# Patient Record
Sex: Female | Born: 1955
Health system: Southern US, Community
[De-identification: ages and names within clinical notes are randomized; demographics above are authoritative.]

## PROBLEM LIST (undated history)

## (undated) DIAGNOSIS — Z8701 Personal history of pneumonia (recurrent): Secondary | ICD-10-CM

## (undated) DIAGNOSIS — H3500 Unspecified background retinopathy: Secondary | ICD-10-CM

## (undated) DIAGNOSIS — R7303 Prediabetes: Secondary | ICD-10-CM

## (undated) DIAGNOSIS — M1990 Unspecified osteoarthritis, unspecified site: Secondary | ICD-10-CM

## (undated) DIAGNOSIS — Z8489 Family history of other specified conditions: Secondary | ICD-10-CM

## (undated) DIAGNOSIS — R112 Nausea with vomiting, unspecified: Secondary | ICD-10-CM

## (undated) DIAGNOSIS — E119 Type 2 diabetes mellitus without complications: Secondary | ICD-10-CM

## (undated) DIAGNOSIS — Z8719 Personal history of other diseases of the digestive system: Secondary | ICD-10-CM

## (undated) DIAGNOSIS — T884XXA Failed or difficult intubation, initial encounter: Secondary | ICD-10-CM

## (undated) DIAGNOSIS — H04129 Dry eye syndrome of unspecified lacrimal gland: Secondary | ICD-10-CM

## (undated) DIAGNOSIS — I10 Essential (primary) hypertension: Secondary | ICD-10-CM

## (undated) DIAGNOSIS — K219 Gastro-esophageal reflux disease without esophagitis: Secondary | ICD-10-CM

## (undated) DIAGNOSIS — E7800 Pure hypercholesterolemia, unspecified: Secondary | ICD-10-CM

## (undated) DIAGNOSIS — R05 Cough: Secondary | ICD-10-CM

## (undated) DIAGNOSIS — Z803 Family history of malignant neoplasm of breast: Secondary | ICD-10-CM

## (undated) DIAGNOSIS — C44529 Squamous cell carcinoma of skin of other part of trunk: Secondary | ICD-10-CM

## (undated) DIAGNOSIS — R519 Headache, unspecified: Secondary | ICD-10-CM

## (undated) DIAGNOSIS — Z801 Family history of malignant neoplasm of trachea, bronchus and lung: Secondary | ICD-10-CM

## (undated) DIAGNOSIS — E039 Hypothyroidism, unspecified: Secondary | ICD-10-CM

## (undated) DIAGNOSIS — R0600 Dyspnea, unspecified: Secondary | ICD-10-CM

## (undated) DIAGNOSIS — H269 Unspecified cataract: Secondary | ICD-10-CM

## (undated) DIAGNOSIS — K589 Irritable bowel syndrome without diarrhea: Secondary | ICD-10-CM

## (undated) DIAGNOSIS — M069 Rheumatoid arthritis, unspecified: Secondary | ICD-10-CM

## (undated) DIAGNOSIS — R059 Cough, unspecified: Secondary | ICD-10-CM

## (undated) DIAGNOSIS — Z8042 Family history of malignant neoplasm of prostate: Secondary | ICD-10-CM

## (undated) DIAGNOSIS — B029 Zoster without complications: Secondary | ICD-10-CM

## (undated) DIAGNOSIS — H409 Unspecified glaucoma: Secondary | ICD-10-CM

## (undated) DIAGNOSIS — Z808 Family history of malignant neoplasm of other organs or systems: Secondary | ICD-10-CM

## (undated) DIAGNOSIS — T8859XA Other complications of anesthesia, initial encounter: Secondary | ICD-10-CM

## (undated) DIAGNOSIS — Z9889 Other specified postprocedural states: Secondary | ICD-10-CM

## (undated) DIAGNOSIS — E079 Disorder of thyroid, unspecified: Secondary | ICD-10-CM

## (undated) DIAGNOSIS — T4145XA Adverse effect of unspecified anesthetic, initial encounter: Secondary | ICD-10-CM

## (undated) HISTORY — PX: ESOPHAGEAL DILATION: SHX303

## (undated) HISTORY — DX: Unspecified cataract: H26.9

## (undated) HISTORY — DX: Essential (primary) hypertension: I10

## (undated) HISTORY — DX: Family history of malignant neoplasm of other organs or systems: Z80.8

## (undated) HISTORY — DX: Dry eye syndrome of unspecified lacrimal gland: H04.129

## (undated) HISTORY — PX: COLONOSCOPY: SHX174

## (undated) HISTORY — PX: TRACHEOSTOMY: SUR1362

## (undated) HISTORY — DX: Unspecified background retinopathy: H35.00

## (undated) HISTORY — PX: CATARACT EXTRACTION, BILATERAL: SHX1313

## (undated) HISTORY — DX: Pure hypercholesterolemia, unspecified: E78.00

## (undated) HISTORY — PX: ABDOMINAL HYSTERECTOMY: SHX81

## (undated) HISTORY — DX: Unspecified glaucoma: H40.9

## (undated) HISTORY — DX: Unspecified osteoarthritis, unspecified site: M19.90

## (undated) HISTORY — PX: TUBAL LIGATION: SHX77

## (undated) HISTORY — DX: Disorder of thyroid, unspecified: E07.9

## (undated) HISTORY — DX: Rheumatoid arthritis, unspecified: M06.9

## (undated) HISTORY — DX: Irritable bowel syndrome, unspecified: K58.9

## (undated) HISTORY — DX: Family history of malignant neoplasm of breast: Z80.3

## (undated) HISTORY — PX: TONSILLECTOMY: SUR1361

## (undated) HISTORY — DX: Family history of malignant neoplasm of trachea, bronchus and lung: Z80.1

## (undated) HISTORY — DX: Zoster without complications: B02.9

## (undated) HISTORY — DX: Family history of malignant neoplasm of prostate: Z80.42

## (undated) HISTORY — PX: CHOLECYSTECTOMY: SHX55

## (undated) HISTORY — PX: SKIN BIOPSY: SHX1

## (undated) HISTORY — PX: BREAST SURGERY: SHX581

---
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
## 2002-10-08 ENCOUNTER — Encounter: Admission: RE | Admit: 2002-10-08 | Discharge: 2002-10-08 | Payer: Self-pay | Admitting: Family Medicine

## 2002-10-08 ENCOUNTER — Encounter: Payer: Self-pay | Admitting: Family Medicine

## 2004-06-03 ENCOUNTER — Ambulatory Visit: Payer: Self-pay | Admitting: Family Medicine

## 2004-07-28 ENCOUNTER — Ambulatory Visit: Payer: Self-pay | Admitting: Family Medicine

## 2005-08-23 ENCOUNTER — Ambulatory Visit: Payer: Self-pay | Admitting: Family Medicine

## 2005-09-10 ENCOUNTER — Ambulatory Visit: Payer: Self-pay | Admitting: Family Medicine

## 2005-09-21 ENCOUNTER — Emergency Department (HOSPITAL_COMMUNITY): Admission: EM | Admit: 2005-09-21 | Discharge: 2005-09-22 | Payer: Self-pay | Admitting: Emergency Medicine

## 2006-01-20 ENCOUNTER — Ambulatory Visit: Payer: Self-pay | Admitting: Family Medicine

## 2006-01-20 LAB — CONVERTED CEMR LAB
BUN: 15 mg/dL (ref 6–23)
Basophils Absolute: 0 10*3/uL (ref 0.0–0.1)
Basophils Relative: 0.6 % (ref 0.0–1.0)
CO2: 30 meq/L (ref 19–32)
Calcium: 10 mg/dL (ref 8.4–10.5)
Chloride: 102 meq/L (ref 96–112)
Creatinine, Ser: 0.6 mg/dL (ref 0.4–1.2)
Eosinophil percent: 2.3 % (ref 0.0–5.0)
GFR calc non Af Amer: 112 mL/min
Glomerular Filtration Rate, Af Am: 135 mL/min/{1.73_m2}
Glucose, Bld: 87 mg/dL (ref 70–99)
HCT: 41 % (ref 36.0–46.0)
Hemoglobin: 14.5 g/dL (ref 12.0–15.0)
Lymphocytes Relative: 29.3 % (ref 12.0–46.0)
MCHC: 35.3 g/dL (ref 30.0–36.0)
MCV: 88.9 fL (ref 78.0–100.0)
Monocytes Absolute: 0.6 10*3/uL (ref 0.2–0.7)
Monocytes Relative: 9.1 % (ref 3.0–11.0)
Neutro Abs: 3.9 10*3/uL (ref 1.4–7.7)
Neutrophils Relative %: 58.7 % (ref 43.0–77.0)
Platelets: 277 10*3/uL (ref 150–400)
Potassium: 3.9 meq/L (ref 3.5–5.1)
RBC: 4.61 M/uL (ref 3.87–5.11)
RDW: 12.2 % (ref 11.5–14.6)
Rheumatoid Fact: 237.3 intl units/mL — ABNORMAL HIGH (ref 0.0–20.0)
Sed Rate: 25 mm/hr (ref 0–25)
Sodium: 141 meq/L (ref 135–145)
TSH: 1.12 microintl units/mL (ref 0.35–5.50)
Total CK: 70 units/L (ref 7–177)
WBC: 6.7 10*3/uL (ref 4.5–10.5)

## 2006-01-25 ENCOUNTER — Ambulatory Visit: Payer: Self-pay | Admitting: Family Medicine

## 2006-01-25 LAB — CONVERTED CEMR LAB: Cyclic Citrullin Peptide Ab: 173 units — ABNORMAL HIGH (ref <20)

## 2006-02-22 ENCOUNTER — Ambulatory Visit: Payer: Self-pay | Admitting: Family Medicine

## 2006-04-05 ENCOUNTER — Ambulatory Visit: Payer: Self-pay | Admitting: Family Medicine

## 2006-04-06 LAB — CONVERTED CEMR LAB
ALT: 15 units/L (ref 0–40)
AST: 21 units/L (ref 0–37)
Albumin: 4.1 g/dL (ref 3.5–5.2)
Alkaline Phosphatase: 66 units/L (ref 39–117)
BUN: 11 mg/dL (ref 6–23)
Basophils Absolute: 0.1 10*3/uL (ref 0.0–0.1)
Basophils Relative: 1 % (ref 0.0–1.0)
Bilirubin, Direct: 0.1 mg/dL (ref 0.0–0.3)
CO2: 32 meq/L (ref 19–32)
Calcium: 9.6 mg/dL (ref 8.4–10.5)
Chloride: 108 meq/L (ref 96–112)
Creatinine, Ser: 0.6 mg/dL (ref 0.4–1.2)
Eosinophils Absolute: 0.4 10*3/uL (ref 0.0–0.6)
Eosinophils Relative: 5.3 % — ABNORMAL HIGH (ref 0.0–5.0)
GFR calc Af Amer: 135 mL/min
GFR calc non Af Amer: 112 mL/min
Glucose, Bld: 95 mg/dL (ref 70–99)
HCT: 41.3 % (ref 36.0–46.0)
Hemoglobin: 14 g/dL (ref 12.0–15.0)
Lymphocytes Relative: 34 % (ref 12.0–46.0)
MCHC: 33.9 g/dL (ref 30.0–36.0)
MCV: 90.7 fL (ref 78.0–100.0)
Monocytes Absolute: 0.5 10*3/uL (ref 0.2–0.7)
Monocytes Relative: 7.4 % (ref 3.0–11.0)
Neutro Abs: 3.8 10*3/uL (ref 1.4–7.7)
Neutrophils Relative %: 52.3 % (ref 43.0–77.0)
Platelets: 277 10*3/uL (ref 150–400)
Potassium: 4.6 meq/L (ref 3.5–5.1)
RBC: 4.55 M/uL (ref 3.87–5.11)
RDW: 12.9 % (ref 11.5–14.6)
Rhuematoid fact SerPl-aCnc: 198 intl units/mL — ABNORMAL HIGH (ref 0.0–20.0)
Sodium: 145 meq/L (ref 135–145)
Total Bilirubin: 0.5 mg/dL (ref 0.3–1.2)
Total Protein: 6.8 g/dL (ref 6.0–8.3)
WBC: 7.3 10*3/uL (ref 4.5–10.5)

## 2006-06-29 DIAGNOSIS — K219 Gastro-esophageal reflux disease without esophagitis: Secondary | ICD-10-CM | POA: Insufficient documentation

## 2006-06-29 DIAGNOSIS — M069 Rheumatoid arthritis, unspecified: Secondary | ICD-10-CM | POA: Insufficient documentation

## 2006-07-04 ENCOUNTER — Ambulatory Visit: Payer: Self-pay | Admitting: Family Medicine

## 2006-11-04 ENCOUNTER — Encounter: Payer: Self-pay | Admitting: Family Medicine

## 2007-01-31 ENCOUNTER — Ambulatory Visit: Payer: Self-pay | Admitting: Family Medicine

## 2007-01-31 LAB — CONVERTED CEMR LAB
ALT: 16 units/L (ref 0–35)
AST: 19 units/L (ref 0–37)
Albumin: 4.1 g/dL (ref 3.5–5.2)
Alkaline Phosphatase: 66 units/L (ref 39–117)
BUN: 18 mg/dL (ref 6–23)
Basophils Absolute: 0 10*3/uL (ref 0.0–0.1)
Basophils Relative: 0.3 % (ref 0.0–1.0)
Bilirubin Urine: NEGATIVE
Bilirubin, Direct: 0.1 mg/dL (ref 0.0–0.3)
CO2: 29 meq/L (ref 19–32)
Calcium: 9.7 mg/dL (ref 8.4–10.5)
Chloride: 104 meq/L (ref 96–112)
Cholesterol: 221 mg/dL (ref 0–200)
Creatinine, Ser: 0.5 mg/dL (ref 0.4–1.2)
Direct LDL: 158.2 mg/dL
Eosinophils Absolute: 0.5 10*3/uL (ref 0.0–0.6)
Eosinophils Relative: 8.3 % — ABNORMAL HIGH (ref 0.0–5.0)
GFR calc Af Amer: 166 mL/min
GFR calc non Af Amer: 137 mL/min
Glucose, Bld: 110 mg/dL — ABNORMAL HIGH (ref 70–99)
Glucose, Urine, Semiquant: NEGATIVE
HCT: 41.8 % (ref 36.0–46.0)
HDL: 37.6 mg/dL — ABNORMAL LOW (ref >39.0)
Hemoglobin: 14.5 g/dL (ref 12.0–15.0)
Ketones, urine, test strip: NEGATIVE
Lymphocytes Relative: 42.4 % (ref 12.0–46.0)
MCHC: 34.7 g/dL (ref 30.0–36.0)
MCV: 91.1 fL (ref 78.0–100.0)
Monocytes Absolute: 0.5 10*3/uL (ref 0.2–0.7)
Monocytes Relative: 8.3 % (ref 3.0–11.0)
Neutro Abs: 2.4 10*3/uL (ref 1.4–7.7)
Neutrophils Relative %: 40.7 % — ABNORMAL LOW (ref 43.0–77.0)
Nitrite: NEGATIVE
Platelets: 305 10*3/uL (ref 150–400)
Potassium: 4.3 meq/L (ref 3.5–5.1)
Protein, U semiquant: NEGATIVE
RBC: 4.59 M/uL (ref 3.87–5.11)
RDW: 12.5 % (ref 11.5–14.6)
Sodium: 141 meq/L (ref 135–145)
Specific Gravity, Urine: 1.02
TSH: 8.94 microintl units/mL — ABNORMAL HIGH (ref 0.35–5.50)
Total Bilirubin: 0.7 mg/dL (ref 0.3–1.2)
Total CHOL/HDL Ratio: 5.9
Total Protein: 7.2 g/dL (ref 6.0–8.3)
Triglycerides: 127 mg/dL (ref 0–149)
Urobilinogen, UA: 0.2
VLDL: 25 mg/dL (ref 0–40)
WBC: 6 10*3/uL (ref 4.5–10.5)
pH: 5.5

## 2007-02-21 ENCOUNTER — Ambulatory Visit: Payer: Self-pay | Admitting: Family Medicine

## 2007-02-21 DIAGNOSIS — G43909 Migraine, unspecified, not intractable, without status migrainosus: Secondary | ICD-10-CM | POA: Insufficient documentation

## 2007-02-21 DIAGNOSIS — L409 Psoriasis, unspecified: Secondary | ICD-10-CM | POA: Insufficient documentation

## 2007-02-21 DIAGNOSIS — M3500 Sicca syndrome, unspecified: Secondary | ICD-10-CM | POA: Insufficient documentation

## 2007-02-21 DIAGNOSIS — N952 Postmenopausal atrophic vaginitis: Secondary | ICD-10-CM | POA: Insufficient documentation

## 2007-02-21 DIAGNOSIS — E039 Hypothyroidism, unspecified: Secondary | ICD-10-CM | POA: Insufficient documentation

## 2007-02-23 ENCOUNTER — Telehealth: Payer: Self-pay | Admitting: Family Medicine

## 2007-04-25 ENCOUNTER — Ambulatory Visit: Payer: Self-pay | Admitting: Family Medicine

## 2007-04-25 LAB — CONVERTED CEMR LAB: TSH: 3.62 microintl units/mL (ref 0.35–5.50)

## 2007-05-03 ENCOUNTER — Telehealth: Payer: Self-pay | Admitting: Family Medicine

## 2007-06-08 ENCOUNTER — Ambulatory Visit: Payer: Self-pay | Admitting: Family Medicine

## 2007-06-08 DIAGNOSIS — N3000 Acute cystitis without hematuria: Secondary | ICD-10-CM | POA: Insufficient documentation

## 2007-06-08 LAB — CONVERTED CEMR LAB
Bilirubin Urine: NEGATIVE
Blood in Urine, dipstick: NEGATIVE
Glucose, Urine, Semiquant: NEGATIVE
Ketones, urine, test strip: NEGATIVE
Nitrite: POSITIVE
Specific Gravity, Urine: 1.015
Urobilinogen, UA: 0.2
pH: 5

## 2007-07-25 ENCOUNTER — Encounter: Payer: Self-pay | Admitting: Internal Medicine

## 2007-10-23 ENCOUNTER — Telehealth: Payer: Self-pay | Admitting: Family Medicine

## 2007-10-25 ENCOUNTER — Telehealth: Payer: Self-pay | Admitting: *Deleted

## 2007-10-30 ENCOUNTER — Telehealth: Payer: Self-pay | Admitting: *Deleted

## 2007-11-16 ENCOUNTER — Ambulatory Visit: Payer: Self-pay | Admitting: Family Medicine

## 2008-01-10 ENCOUNTER — Telehealth: Payer: Self-pay | Admitting: Family Medicine

## 2008-02-07 ENCOUNTER — Ambulatory Visit: Payer: Self-pay | Admitting: Internal Medicine

## 2008-02-20 ENCOUNTER — Ambulatory Visit: Payer: Self-pay | Admitting: Family Medicine

## 2008-02-20 LAB — CONVERTED CEMR LAB
ALT: 19 units/L (ref 0–35)
AST: 24 units/L (ref 0–37)
Albumin: 4.1 g/dL (ref 3.5–5.2)
Alkaline Phosphatase: 60 units/L (ref 39–117)
BUN: 12 mg/dL (ref 6–23)
Basophils Absolute: 0 10*3/uL (ref 0.0–0.1)
Basophils Relative: 1 % (ref 0.0–3.0)
Bilirubin Urine: NEGATIVE
Bilirubin, Direct: 0.1 mg/dL (ref 0.0–0.3)
CO2: 27 meq/L (ref 19–32)
Calcium: 9.6 mg/dL (ref 8.4–10.5)
Chloride: 112 meq/L (ref 96–112)
Cholesterol: 231 mg/dL (ref 0–200)
Creatinine, Ser: 0.7 mg/dL (ref 0.4–1.2)
Direct LDL: 173.7 mg/dL
Eosinophils Absolute: 0.3 10*3/uL (ref 0.0–0.7)
Eosinophils Relative: 6 % — ABNORMAL HIGH (ref 0.0–5.0)
GFR calc Af Amer: 112 mL/min
GFR calc non Af Amer: 93 mL/min
Glucose, Bld: 112 mg/dL — ABNORMAL HIGH (ref 70–99)
Glucose, Urine, Semiquant: NEGATIVE
HCT: 40.6 % (ref 36.0–46.0)
HDL: 41.2 mg/dL (ref >39.0)
Hemoglobin: 14 g/dL (ref 12.0–15.0)
Ketones, urine, test strip: NEGATIVE
Lymphocytes Relative: 44.8 % (ref 12.0–46.0)
MCHC: 34.4 g/dL (ref 30.0–36.0)
MCV: 94 fL (ref 78.0–100.0)
Monocytes Absolute: 0.4 10*3/uL (ref 0.1–1.0)
Monocytes Relative: 8.5 % (ref 3.0–12.0)
Neutro Abs: 1.9 10*3/uL (ref 1.4–7.7)
Neutrophils Relative %: 39.7 % — ABNORMAL LOW (ref 43.0–77.0)
Nitrite: NEGATIVE
Platelets: 276 10*3/uL (ref 150–400)
Potassium: 4 meq/L (ref 3.5–5.1)
Protein, U semiquant: NEGATIVE
RBC: 4.32 M/uL (ref 3.87–5.11)
RDW: 12.8 % (ref 11.5–14.6)
Sodium: 143 meq/L (ref 135–145)
Specific Gravity, Urine: 1.02
TSH: 1.74 microintl units/mL (ref 0.35–5.50)
Total Bilirubin: 0.5 mg/dL (ref 0.3–1.2)
Total CHOL/HDL Ratio: 5.6
Total Protein: 7.1 g/dL (ref 6.0–8.3)
Triglycerides: 112 mg/dL (ref 0–149)
Urobilinogen, UA: 0.2
VLDL: 22 mg/dL (ref 0–40)
WBC Urine, dipstick: NEGATIVE
WBC: 4.8 10*3/uL (ref 4.5–10.5)
pH: 7

## 2008-02-26 ENCOUNTER — Encounter: Payer: Self-pay | Admitting: Family Medicine

## 2008-02-27 ENCOUNTER — Telehealth: Payer: Self-pay | Admitting: Internal Medicine

## 2008-02-27 ENCOUNTER — Ambulatory Visit: Payer: Self-pay | Admitting: Family Medicine

## 2008-02-27 DIAGNOSIS — E785 Hyperlipidemia, unspecified: Secondary | ICD-10-CM | POA: Insufficient documentation

## 2008-03-05 ENCOUNTER — Ambulatory Visit: Payer: Self-pay | Admitting: Internal Medicine

## 2008-05-21 ENCOUNTER — Encounter: Payer: Self-pay | Admitting: Family Medicine

## 2008-07-30 ENCOUNTER — Ambulatory Visit: Payer: Self-pay | Admitting: Family Medicine

## 2008-07-30 LAB — CONVERTED CEMR LAB
BUN: 17 mg/dL (ref 6–23)
CO2: 29 meq/L (ref 19–32)
Calcium: 9.4 mg/dL (ref 8.4–10.5)
Chloride: 108 meq/L (ref 96–112)
Cholesterol: 223 mg/dL — ABNORMAL HIGH (ref 0–200)
Creatinine, Ser: 0.7 mg/dL (ref 0.4–1.2)
Direct LDL: 158.9 mg/dL
GFR calc non Af Amer: 92.37 mL/min (ref >60)
Glucose, Bld: 109 mg/dL — ABNORMAL HIGH (ref 70–99)
HDL: 40 mg/dL (ref >39.00)
Potassium: 4.2 meq/L (ref 3.5–5.1)
Sodium: 143 meq/L (ref 135–145)
Total CHOL/HDL Ratio: 6
Triglycerides: 137 mg/dL (ref 0.0–149.0)
VLDL: 27.4 mg/dL (ref 0.0–40.0)

## 2008-08-06 ENCOUNTER — Ambulatory Visit: Payer: Self-pay | Admitting: Family Medicine

## 2008-08-06 DIAGNOSIS — R030 Elevated blood-pressure reading, without diagnosis of hypertension: Secondary | ICD-10-CM | POA: Insufficient documentation

## 2008-08-26 ENCOUNTER — Telehealth: Payer: Self-pay | Admitting: *Deleted

## 2009-03-18 ENCOUNTER — Ambulatory Visit: Payer: Self-pay | Admitting: Family Medicine

## 2009-03-18 LAB — CONVERTED CEMR LAB
ALT: 20 units/L (ref 0–35)
AST: 30 units/L (ref 0–37)
Albumin: 4.1 g/dL (ref 3.5–5.2)
Alkaline Phosphatase: 68 units/L (ref 39–117)
BUN: 10 mg/dL (ref 6–23)
Basophils Absolute: 0 10*3/uL (ref 0.0–0.1)
Basophils Relative: 0.4 % (ref 0.0–3.0)
Bilirubin Urine: NEGATIVE
Bilirubin, Direct: 0.1 mg/dL (ref 0.0–0.3)
Blood in Urine, dipstick: NEGATIVE
CO2: 30 meq/L (ref 19–32)
Calcium: 9.7 mg/dL (ref 8.4–10.5)
Chloride: 110 meq/L (ref 96–112)
Cholesterol: 234 mg/dL — ABNORMAL HIGH (ref 0–200)
Creatinine, Ser: 0.7 mg/dL (ref 0.4–1.2)
Direct LDL: 163 mg/dL
Eosinophils Absolute: 0.2 10*3/uL (ref 0.0–0.7)
Eosinophils Relative: 3.5 % (ref 0.0–5.0)
GFR calc non Af Amer: 92.16 mL/min (ref >60)
Glucose, Bld: 94 mg/dL (ref 70–99)
Glucose, Urine, Semiquant: NEGATIVE
HCT: 42 % (ref 36.0–46.0)
HDL: 50.8 mg/dL (ref >39.00)
Hemoglobin: 14 g/dL (ref 12.0–15.0)
Ketones, urine, test strip: NEGATIVE
Lymphocytes Relative: 45 % (ref 12.0–46.0)
Lymphs Abs: 2.4 10*3/uL (ref 0.7–4.0)
MCHC: 33.3 g/dL (ref 30.0–36.0)
MCV: 94.8 fL (ref 78.0–100.0)
Monocytes Absolute: 0.4 10*3/uL (ref 0.1–1.0)
Monocytes Relative: 8.3 % (ref 3.0–12.0)
Neutro Abs: 2.2 10*3/uL (ref 1.4–7.7)
Neutrophils Relative %: 42.8 % — ABNORMAL LOW (ref 43.0–77.0)
Nitrite: NEGATIVE
Platelets: 312 10*3/uL (ref 150.0–400.0)
Potassium: 4.4 meq/L (ref 3.5–5.1)
Protein, U semiquant: NEGATIVE
RBC: 4.44 M/uL (ref 3.87–5.11)
RDW: 12.9 % (ref 11.5–14.6)
Sodium: 145 meq/L (ref 135–145)
Specific Gravity, Urine: 1.015
TSH: 2 microintl units/mL (ref 0.35–5.50)
Total Bilirubin: 0.5 mg/dL (ref 0.3–1.2)
Total CHOL/HDL Ratio: 5
Total Protein: 7.9 g/dL (ref 6.0–8.3)
Triglycerides: 133 mg/dL (ref 0.0–149.0)
Urobilinogen, UA: 0.2
VLDL: 26.6 mg/dL (ref 0.0–40.0)
WBC Urine, dipstick: NEGATIVE
WBC: 5.2 10*3/uL (ref 4.5–10.5)
pH: 7.5

## 2009-04-01 ENCOUNTER — Ambulatory Visit: Payer: Self-pay | Admitting: Family Medicine

## 2009-11-05 ENCOUNTER — Encounter: Payer: Self-pay | Admitting: Family Medicine

## 2010-02-19 NOTE — Progress Notes (Signed)
Summary: need correct dosage  Phone Note From Pharmacy   Caller: [facility] Summary of Call: Recieved a fax from the pharmach saying that pt told them that there has been a change in methotrezate 2.5mg . They need a new rx for this. I called the pt and left a message for pt to call us back so we can get the correct dosage for the pharmacy. Initial call taken by: Romualdo Bolk, CMA,  October 30, 2007 3:45 PM  Follow-up for Phone Call        left message with female for patient to return our call Follow-up by: Kern Reap CMA,  November 10, 2007 11:36 AM

## 2010-02-19 NOTE — Assessment & Plan Note (Signed)
Summary: cpx/mhf   Vital Signs:  Patient Profile:   55 Years Old Female Height:     68 inches (172.72 cm) Weight:      214 pounds Temp:     98.1 degrees F oral BP sitting:   140 / 90  (left arm)  Vitals Entered By: Kern Reap CMA (February 27, 2008 8:44 AM)                 Chief Complaint:  cpx.  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  She has rheumatoid arthritis.  She currently takes Celebrex 200 mg daily, Flexeril 10 mg nightly, p.r.n., Darvocet-N 100, 1 or two tabs daily, and methotrexate 2.5-mg tabs.  She takes 8 tablets weekly.  She gets routine follow-up by rheumatology.  She uses Nasonex nasal spray for allergic rhinitis, along with over-the-counter Zyrtec.  She also has migraine headaches.  Since she started the Topamax her migraines have decreased since frequency and severity.  When she does have a breakthrough migraines uses Imitrex 50 mg.  A new problem is severe vaginal dryness.  She tried the cream, but it burns and irritates her.  She would like to discuss other options.  She has hypothyroidism.  She was 75 micrograms of thyroid daily.  TSH is within normal limits.  A new problem is LDL of 173, with a total cholesterol 231.  We will discuss a diet and exercise program with follow-up lab work in 4 months.  Last tetanus booster 2006, last mammogram a year and a half ago.  She states she has some soreness in her left breast and she points to the 12 o'clock position just above the nipple.  She states she bruised herself there.  She is due for a colonoscopy next week.  Another new problem.  She complains of some change in her memory.  She says she is having trouble recalling words on occasion.    Updated Prior Medication List: NASONEX 50 MCG/ACT  SUSP (MOMETASONE FUROATE) use as directed CELEBREX 200 MG  CAPS (CELECOXIB) 1 once daily IMITREX 50 MG  TABS (SUMATRIPTAN  SUCCINATE) UAD FLEXERIL 10 MG  TABS (CYCLOBENZAPRINE HCL) 1 at bedtime as needed DARVOCET-N 100 100-650 MG TABS (PROPOXYPHENE N-APAP) as needed CLOBETASOL PROPIONATE 0.05 %  FOAM (CLOBETASOL PROPIONATE) as needed KETOCONAZOLE 2 %  CREA (KETOCONAZOLE) mixed with flutica   .05% METHOTREXATE 2.5 MG  TABS (METHOTREXATE SODIUM) one tab once daily ESTRACE 0.1 MG/GM  CREA (ESTRADIOL) UAD NEXIUM 40 MG  PACK (ESOMEPRAZOLE MAGNESIUM) Take 1 tablet by mouth every morning ZYRTEC ALLERGY 10 MG  TABS (CETIRIZINE HCL) as needed FISH OIL CONCENTRATE 1000 MG  CAPS (OMEGA-3 FATTY ACIDS) once daily GLUCOSAMINE-CHONDROITIN 1500-1200 MG/30ML  LIQD (GLUCOSAMINE-CHONDROITIN) once daily CALTRATE 600+D 600-400 MG-UNIT  TABS (CALCIUM CARBONATE-VITAMIN D) once daily FOLIC ACID 800 MCG TABS (FOLIC ACID) once daily SYNTHROID 75 MCG TABS (LEVOTHYROXINE SODIUM) Take 1 tablet by mouth every morning MOVIPREP 100 GM  SOLR (PEG-KCL-NACL-NASULF-NA ASC-C) As directed PROMETHAZINE HCL 25 MG TABS (PROMETHAZINE HCL) use as needed for headache TOPAMAX 25 MG TABS (TOPIRAMATE) take one tab at bedtime  Current Allergies (reviewed today): ! TALWIN ! DEMEROL [name]  Past Medical History:    Reviewed history from 02/21/2007 and no changes required:       MHA       PSORIASIS       Rheumatoid arthritis  GERD       OBESE       PMS       hypothyroidism       senile vaginitis       TAH/BSO        1997, prolapse.  No cancer       childbirth x 2       tonsillectomy       Hyperlipidemia  Past Surgical History:    Reviewed history from 06/29/2006 and no changes required:       HYST       BSO       BTL       CBX2       T&A   Family History:    Reviewed history from 02/21/2007 and no changes required:       father died at 50, primary prostate cancer       mother has depression, diabetes, hypertension, history of smoking, hypothyroidism, and breast cancer       no brothers       two sisters, one has  hypothyroidism, and the other in good health  Social History:    Reviewed history from 02/21/2007 and no changes required:       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    Review of Systems      See HPI   Physical Exam  General:     Well-developed,well-nourished,in no acute distress; alert,appropriate and cooperative throughout examination Head:     Normocephalic and atraumatic without obvious abnormalities. No apparent alopecia or balding. Eyes:     No corneal or conjunctival inflammation noted. EOMI. Perrla. Funduscopic exam benign, without hemorrhages, exudates or papilledema. Vision grossly normal. Ears:     External ear exam shows no significant lesions or deformities.  Otoscopic examination reveals clear canals, tympanic membranes are intact bilaterally without bulging, retraction, inflammation or discharge. Hearing is grossly normal bilaterally. Nose:     External nasal examination shows no deformity or inflammation. Nasal mucosa are pink and moist without lesions or exudates. Mouth:     Oral mucosa and oropharynx without lesions or exudates.  Teeth in good repair. Neck:     No deformities, masses, or tenderness noted. Chest Wall:     No deformities, masses, or tenderness noted. Breasts:     No mass, nodules, thickening, tenderness, bulging, retraction, inflamation, nipple discharge or skin changes noted.   Lungs:     Normal respiratory effort, chest expands symmetrically. Lungs are clear to auscultation, no crackles or wheezes. Heart:     Normal rate and regular rhythm. S1 and S2 normal without gallop, murmur, click, rub or other extra sounds. Abdomen:     Bowel sounds positive,abdomen soft and non-tender without masses, organomegaly or hernias noted. Rectal:     No external abnormalities noted. Normal sphincter tone. No rectal  masses or tenderness. Genitalia:     Pelvic Exam:        External: normal female genitalia without lesions or masses        Vagina: normal without lesions or masses        Cervix: normal without lesions or masses        Adnexa: normal  bimanual exam without masses or fullness        Uterus: normal by palpation        Pap smear: not performed Msk:     No deformity or scoliosis noted of thoracic or lumbar spine.   Pulses:     R and L carotid,radial,femoral,dorsalis pedis and posterior tibial pulses are full and equal bilaterally Extremities:     No clubbing, cyanosis, edema, or deformity noted with normal full range of motion of all joints.   Neurologic:     No cranial nerve deficits noted. Station and gait are normal. Plantar reflexes are down-going bilaterally. DTRs are symmetrical throughout. Sensory, motor and coordinative functions appear intact. Skin:     Intact without suspicious lesions or rashes Cervical Nodes:     No lymphadenopathy noted Axillary Nodes:     No palpable lymphadenopathy Inguinal Nodes:     No significant adenopathy Psych:     Cognition and judgment appear intact. Alert and cooperative with normal attention span and concentration. No apparent delusions, illusions, hallucinations    Impression & Recommendations:  Problem # 1:  HYPERLIPIDEMIA (ICD-272.4) Assessment: New  Orders: EKG w/ Interpretation (93000)   Problem # 2:  MIGRAINE HEADACHE (ICD-346.90) Assessment: Improved  Her updated medication list for this problem includes:    Celebrex 200 Mg Caps (Celecoxib) .Marland Kitchen... 1 once daily    Imitrex 50 Mg Tabs (Sumatriptan succinate) ..... Uad    Darvocet-n 100 100-650 Mg Tabs (Propoxyphene n-apap) .Marland Kitchen... Take 1 tablet by mouth three times a day   Problem # 3:  VAGINITIS, ATROPHIC (ICD-627.3) Assessment: Deteriorated  Her updated medication list for this problem includes:    Estrace 0.1 Mg/gm Crea (Estradiol) ..... Uad    Vagifem 25 Mcg Tabs  (Estradiol) ..... Insert one 2 x week   Problem # 4:  UNSPECIFIED HYPOTHYROIDISM (ICD-244.9) Assessment: Improved  Her updated medication list for this problem includes:    Synthroid 75 Mcg Tabs (Levothyroxine sodium) .Marland Kitchen... Take 1 tablet by mouth every morning  Orders: EKG w/ Interpretation (93000)   Problem # 5:  GERD (ICD-530.81) Assessment: Improved  Her updated medication list for this problem includes:    Nexium 40 Mg Pack (Esomeprazole magnesium) .Marland Kitchen... Take 1 tablet by mouth every morning   Problem # 6:  RHEUMATOID ARTHRITIS (ICD-714.0) Assessment: Improved  Her updated medication list for this problem includes:    Celebrex 200 Mg Caps (Celecoxib) .Marland Kitchen... 1 once daily    Methotrexate 2.5 Mg Tabs (Methotrexate sodium) ..... One tab once daily   Complete Medication List: 1)  Nasonex 50 Mcg/act Susp (Mometasone furoate) .... Use as directed 2)  Celebrex 200 Mg Caps (Celecoxib) .Marland Kitchen.. 1 once daily 3)  Imitrex 50 Mg Tabs (Sumatriptan succinate) .... Uad 4)  Flexeril 10 Mg Tabs (Cyclobenzaprine hcl) .Marland Kitchen.. 1 at bedtime as needed 5)  Darvocet-n 100 100-650 Mg Tabs (Propoxyphene n-apap) .... Take 1 tablet by mouth three times a day 6)  Clobetasol Propionate 0.05 % Foam (Clobetasol propionate) .... As needed 7)  Ketoconazole 2 % Crea (Ketoconazole) .... Mixed with flutica   .05% 8)  Methotrexate 2.5 Mg Tabs (Methotrexate sodium) .... One tab once daily 9)  Estrace 0.1 Mg/gm Crea (Estradiol) .... Uad 10)  Nexium 40 Mg Pack (Esomeprazole magnesium) .... Take 1 tablet by mouth every morning 11)  Zyrtec Allergy 10 Mg Tabs (Cetirizine hcl) .... As needed 12)  Fish Oil Concentrate 1000 Mg Caps (Omega-3 fatty acids) .... Once daily 13)  Glucosamine-chondroitin 1500-1200 Mg/49ml  Liqd (Glucosamine-chondroitin) .... Once daily 14)  Caltrate 600+d 600-400 Mg-unit Tabs (Calcium carbonate-vitamin d) .... Once daily 15)  Folic Acid 800 Mcg Tabs (Folic acid) .... Once daily 16)  Synthroid 75 Mcg  Tabs (Levothyroxine sodium) .... Take 1 tablet by mouth every morning 17)  Moviprep 100 Gm Solr (Peg-kcl-nacl-nasulf-na asc-c) .... As directed 18)  Promethazine Hcl 25 Mg Tabs (Promethazine hcl) .... Use as needed for headache 19)  Topamax 25 Mg Tabs (Topiramate) .... Take one tab at bedtime 20)  Vagifem 25 Mcg Tabs (Estradiol) .... Insert one 2 x week  Other Orders: H1N1 vaccine G code (D6387) Influenza A (H1N1) adm  fee Medicare/Non Medicare [id number])   Patient Instructions: 1)  It is important that you exercise regularly at least 20 minutes 5 times a week. If you develop chest pain, have severe difficulty breathing, or feel very tired , stop exercising immediately and seek medical attention. 2)  You need to lose weight. Consider a lower calorie diet and regular exercise.  3)  Please schedule a follow-up appointment in 4 months. 4)  Lipid Panel prior to visit, ICD-9:  272.00 5)  Schedule your mammogram. 6)  Take calcium +Vitamin D daily.   Prescriptions: DARVOCET-N 100 100-650 MG TABS (PROPOXYPHENE N-APAP) Take 1 tablet by mouth three times a day  #100 x 3   Entered and Authorized by:   Roderick Pee MD   Signed by:   Roderick Pee MD on 02/27/2008   Method used:   Print then Give to Patient   RxID:   [id number] VAGIFEM 25 MCG TABS (ESTRADIOL) insert one 2 x week  #30 x 4   Entered and Authorized by:   Roderick Pee MD   Signed by:   Roderick Pee MD on 02/27/2008   Method used:   Electronically to        [facility] (retail)       [address]       [place], [place]  [address]       Ph: [phone]       Fax: [phone]   RxID:   [id number] SYNTHROID 75 MCG TABS (LEVOTHYROXINE SODIUM) Take 1 tablet by mouth every morning  #100 x 3   Entered and Authorized by:   Roderick Pee MD   Signed by:   Roderick Pee MD on 02/27/2008   Method used:   Electronically to        [facility] (retail)       [address]        [place], [place]  [address]       Ph: [phone]       Fax: [phone]   RxID:   [id number] NEXIUM 40 MG  PACK (ESOMEPRAZOLE MAGNESIUM) Take 1 tablet by mouth every morning  #100 x 4   Entered and Authorized by:   Roderick Pee MD   Signed by:   Roderick Pee MD on 02/27/2008   Method used:   Electronically to        [facility] (retail)       [address]       [place], [place]  [address]       Ph: [phone]       Fax: [phone]   RxID:  [id number] KETOCONAZOLE 2 %  CREA (KETOCONAZOLE) mixed with flutica   .05%  #60 grms x 4   Entered and Authorized by:   Roderick Pee MD   Signed by:   Roderick Pee MD on 02/27/2008   Method used:   Electronically to        [facility] (retail)       [address]       [place], [place]  [address]       Ph: [phone]       Fax: [phone]   RxID:   [id number] CLOBETASOL PROPIONATE 0.05 %  FOAM (CLOBETASOL PROPIONATE) as needed  #60 grms x 4   Entered and Authorized by:   Roderick Pee MD   Signed by:   Roderick Pee MD on 02/27/2008   Method used:   Electronically to        [facility] (retail)       [address]       [place], [place]  [address]       Ph: [phone]       Fax: [phone]   RxID:   [id number] FLEXERIL 10 MG  TABS (CYCLOBENZAPRINE HCL) 1 at bedtime as needed  #100 x 4   Entered and Authorized by:   Roderick Pee MD   Signed by:   Roderick Pee MD on 02/27/2008   Method used:   Electronically to        [facility] [address] (retail)       [address]       [place], [place]  [address]       Ph: [phone]       Fax: [phone]   RxID:   [id number] IMITREX 50 MG  TABS (SUMATRIPTAN SUCCINATE) UAD  #9 x 1   Entered and Authorized by:   Roderick Pee MD   Signed by:   Roderick Pee MD on 02/27/2008   Method used:    Electronically to        [facility] [address] (retail)       [address]       [place], [place]  [address]       Ph: [phone]       Fax: [phone]   RxID:   [id number] CELEBREX 200 MG  CAPS (CELECOXIB) 1 once daily  #100 x 4   Entered and Authorized by:   Roderick Pee MD   Signed by:   Roderick Pee MD on 02/27/2008   Method used:   Electronically to        [facility] [address] (retail)       [address]       [place], [place]  [address]       Ph: [phone]       Fax: [phone]   RxID:   [id number] NASONEX 50 MCG/ACT  SUSP (MOMETASONE FUROATE) use as directed  #3 x 4   Entered and Authorized by:   Roderick Pee MD   Signed by:   Roderick Pee MD on 02/27/2008   Method used:   Electronically to        [facility] [address] (retail)       [address]       [place], [place]  [address]       Ph: [phone]       Fax: [phone]   RxID:   [id number]    H1N1 # 1    Vaccine Type: H1N1 vaccine G code    Site: left deltoid    Mfr: novartis    Dose: 0.5 ml    Route: IM    Given by: Kern Reap CMA    Exp. Date: 04/18/2008    Lot #: [id number]

## 2010-02-19 NOTE — Progress Notes (Signed)
Summary: patient does not know the dosage you did not tell her   Phone Note Call from Patient Call back at [phone]   Caller: patient live Call For: Tawanna Cooler Summary of Call: Was in 2-3 for physical.  One of her rx got left off the escript.  Synthroid.  Please send to [facility] [phone] [address]  Initial call taken by: Roselle Locus,  February 23, 2007 12:48 PM  Follow-up for Phone Call        may call in #100 ref x 4 . first check with pt to see what dose she is on.  left mess for to call back. Follow-up by: Sindy Guadeloupe RN,  February 24, 2007 8:26 AM  Additional Follow-up for Phone Call Additional follow up Details #1::        Dr Tawanna Cooler you put her on this at her physical and did not tell her what dosage.  you told her her TSH was too high and that she needed to be on this new med.  you did not give her anrx and youdid not escript this for her.  ..................................................................Marland KitchenRoselle Locus  February 24, 2007 11:28 AM    Additional Follow-up for Phone Call Additional follow up Details #2::    50 micrograms Synthroid number 100 tablets, refills x 4 Follow-up by: Roderick Pee MD,  February 24, 2007 1:29 PM  New/Updated Medications: SYNTHROID 50 MCG  TABS (LEVOTHYROXINE SODIUM) 1 tab  every morning; need F/U TSH in 2 months Please call patient ask her the dose of the Synthroid, Then call in a hundred tablets, one a day, refills x 4  Verbal order , per Dr Tawanna Cooler " synthroid 50 micrograms & f/u TSH in 2 months" .................................................................Marland KitchenMarland KitchenArcola Jansky, RN  February 24, 2007 1:35 PM  Prescriptions: SYNTHROID 50 MCG  TABS (LEVOTHYROXINE SODIUM) 1 tab  every morning; need F/U TSH in 2 months  #100 x 4   Entered by:   Arcola Jansky, RN   Authorized by:   Roderick Pee MD   Signed by:   Arcola Jansky, RN on 02/24/2007   Method used:   Electronically sent to ...       [facility]   [address]  [phone]*       [address]       [place], [place]  [address]       Ph: [phone] or [phone]       Fax: [phone]   RxID:   [id number]

## 2010-02-19 NOTE — Miscellaneous (Signed)
Summary: Immunizations  Immunizations   Imported By: Kassie Mends 08/08/2007 08:44:50  _____________________________________________________________________  External Attachment:    Type:   Image     Comment:   immunizations

## 2010-02-19 NOTE — Progress Notes (Signed)
Summary: celebrex refill  Phone Note From Pharmacy   Caller: [facility]  [address]  [phone]* Summary of Call: pharm requests a refill of celebrex is this okay to fill? Initial call taken by: Kern Reap CMA,  October 23, 2007 8:24 AM  Follow-up for Phone Call        ok for one year Follow-up by: Roderick Pee MD,  October 23, 2007 8:49 AM      Prescriptions: CELEBREX 200 MG  CAPS (CELECOXIB) 1 once daily  #100 x 4   Entered by:   Kern Reap CMA   Authorized by:   Roderick Pee MD   Signed by:   Kern Reap CMA on 10/23/2007   Method used:   Electronically to        [facility]  [phone]* (retail)       [address]       [place], [place]  [address]       Ph: [phone] or [phone]       Fax: [phone]   RxID:   [id number]

## 2010-02-19 NOTE — Assessment & Plan Note (Signed)
Summary: CPX W/PAP/CCM/PT RESCD FROM BUMP/CCM  Medications Added DARVOCET-N 100 100-650 MG TABS (PROPOXYPHENE N-APAP) as needed CLOBETASOL PROPIONATE 0.05 %  FOAM (CLOBETASOL PROPIONATE) as needed KETOCONAZOLE 2 %  CREA (KETOCONAZOLE) mixed with flutica   .05% METHOTREXATE 2.5 MG  TABS (METHOTREXATE SODIUM) q 12 hrs 3 x a wk ESTRACE 0.1 MG/GM  CREA (ESTRADIOL) UAD NEXIUM 40 MG  PACK (ESOMEPRAZOLE MAGNESIUM) Take 1 tablet by mouth every morning      Allergies Added: ! TALWIN ! DEMEROL * DOVANEX  Vital Signs:  Patient Profile:   55 Years Old Female Height:     68 inches (172.72 cm) Weight:      211 pounds (95.91 kg) Temp:     98.2 degrees F (36.78 degrees C) oral Pulse rate:   70 / minute Pulse rhythm:   regular Resp:     12 per minute BP sitting:   162 / 99  (right arm)  Pt. in pain?   no  Vitals Entered By: Arcola Jansky, RN (February 21, 2007 4:08 PM)                  Chief Complaint:  cpx  and labs done.  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  Dry eyes and extremely dry vagina, migraine headaches, psoriasis, sleep dysfunction, secondary to rheumatoid arthritis.  She says her rheumatoid arthritis is under excellent control with the methotrexate 2.5-mg tablets, one every 12 hours x 3.  Weekly.  She's had no major side effects.  The esophagitis is under excellent control with Nexium.  She is having no difficulty swallowing.  The dry skin is a common problem for her in the winter.  She is using a lot of lubrication.  The dry eyes in the dry mouth, and dry.  Some salivary glands are reflective of Sjgren syndrome, which is associated with premature arthritis.  She also has extremely dry, vagina that's more postmenopausal.  She would like to try the hormone cream.  Her migraine headaches, a  decrease in severity and frequency joint has a couple years now.  The psoriasis is under good control with the creams that we have given her.  She would like a refill.  The sleep dysfunction is under good control with Flexeril 10 mg nightly.  Her blood pressure at home is 130/80.  She knows she needs to get her weight down.  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 Tan she wants to get in shape to go bear hunting in [place] in June.  Her mother was recently diagnosed with breast cancer.  Therefore, she did not get to in all winter  Current Allergies: ! TALWIN ! DEMEROL * [name]  Past Medical History:    Reviewed history from 06/29/2006 and no changes required:       MHA       PSORIASIS       Rheumatoid arthritis       GERD       OBESE       PMS       hypothyroidism       senile vaginitis       TAH/BSO        1997, prolapse.  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 No apparent alopecia or balding. Eyes:     No corneal or conjunctival inflammation noted. EOMI. Perrla. Funduscopic exam benign, without hemorrhages, exudates or  papilledema. Vision grossly normal. Ears:     External ear exam shows no significant lesions or deformities.  Otoscopic examination reveals clear canals, tympanic membranes are intact bilaterally without bulging, retraction, inflammation or discharge. Hearing is grossly normal bilaterally. Nose:     External nasal examination shows no deformity or inflammation. Nasal mucosa are pink and moist without lesions or exudates. Mouth:     Oral mucosa and oropharynx without lesions or exudates.  Teeth in good repair. Neck:     No deformities, masses, or tenderness noted. Chest Wall:     No deformities, masses, or tenderness noted. Breasts:     No mass, nodules, thickening, tenderness, bulging, retraction, inflamation, nipple discharge or skin changes noted.   Lungs:     Normal respiratory effort, chest expands symmetrically. Lungs are clear to auscultation, no crackles or wheezes. Heart:     Normal rate and regular rhythm. S1 and S2 normal without gallop, murmur, click, rub or other extra sounds. Abdomen:     Bowel sounds positive,abdomen soft and non-tender without masses, organomegaly or hernias noted. Rectal:     No external abnormalities noted. Normal sphincter tone. No rectal masses or tenderness. Genitalia:     Pelvic Exam:        External: normal female genitalia without lesions or masses        Vagina: normal without lesions or masses        Cervix: normal without lesions or masses        Adnexa: normal bimanual exam without masses or fullness        Uterus: normal by palpation        Pap smear: not performed Msk:     No deformity or scoliosis noted of thoracic or lumbar spine.   Pulses:     R and L carotid,radial,femoral,dorsalis pedis and posterior tibial pulses are full and equal bilaterally Extremities:     No clubbing, cyanosis, edema, or deformity noted with normal full range of motion of all joints.   Neurologic:     No cranial nerve deficits noted. Station and gait are  normal. Plantar reflexes are down-going bilaterally. DTRs are symmetrical throughout. Sensory, motor and coordinative functions appear intact.    Impression & Recommendations:  Problem #  1:  GERD (ICD-530.81) Assessment: Improved  Her updated medication list for this problem includes:    Nexium 40 Mg Pack (Esomeprazole magnesium) .Marland Kitchen... Take 1 tablet by mouth every morning   Problem # 2:  RHEUMATOID ARTHRITIS (ICD-714.0) Assessment: Improved  Her updated medication list for this problem includes:    Celebrex 200 Mg Caps (Celecoxib) .Marland Kitchen... 1 once daily    Methotrexate 2.5 Mg Tabs (Methotrexate sodium) ..... Q 12 hrs 3 x a wk   Problem # 3:  UNSPECIFIED HYPOTHYROIDISM (ICD-244.9) Assessment: New  Problem # 4:  SICCA SYNDROME (ICD-710.2) Assessment: New  Problem # 5:  PSORIASIS (ICD-696.1) Assessment: Improved  Problem # 6:  VAGINITIS, ATROPHIC (ICD-627.3) Assessment: New  Her updated medication list for this problem includes:    Estrace 0.1 Mg/gm Crea (Estradiol) ..... Uad   Problem # 7:  MIGRAINE HEADACHE (ICD-346.90) Assessment: Improved  Her updated medication list for this problem includes:    Celebrex 200 Mg Caps (Celecoxib) .Marland Kitchen... 1 once daily    Imitrex 50 Mg Tabs (Sumatriptan succinate) ..... Uad    Darvocet-n 100 100-650 Mg Tabs (Propoxyphene n-apap) .Marland Kitchen... As needed   Complete Medication List: 1)  Nasonex 50 Mcg/act Susp (Mometasone furoate) .... Use as directed 2)  Celebrex 200 Mg Caps (Celecoxib) .Marland Kitchen.. 1 once daily 3)  Imitrex 50 Mg Tabs (Sumatriptan succinate) .... Uad 4)  Flexeril 10 Mg Tabs (Cyclobenzaprine hcl) .Marland Kitchen.. 1 at bedtime as needed 5)  Darvocet-n 100 100-650 Mg Tabs (Propoxyphene n-apap) .... As needed 6)  Clobetasol Propionate 0.05 % Foam (Clobetasol propionate) .... As needed 7)  Ketoconazole 2 % Crea (Ketoconazole) .... Mixed with flutica   .05% 8)  Methotrexate 2.5 Mg Tabs (Methotrexate sodium) .... Q 12 hrs 3 x a wk 9)  Estrace 0.1 Mg/gm  Crea (Estradiol) .... Uad 10)  Nexium 40 Mg Pack (Esomeprazole magnesium) .... Take 1 tablet by mouth every morning  Other Orders: EKG w/ Interpretation (93000)   Patient Instructions: 1)   continue your current medications.  Add the Estrace vaginal cream. 2)  Please schedule a follow-up appointment in 1 year. 3)  It is important that you exercise regularly at least 20 minutes 5 times a week. If you develop chest pain, have severe difficulty breathing, or feel very tired , stop exercising immediately and seek medical attention. 4)  You need to lose weight. Consider a lower calorie diet and regular exercise.  5)  Schedule your mammogram. 6)  Take calcium +Vitamin D daily. 7)  Take an Aspirin every day.    Prescriptions: NEXIUM 40 MG  PACK (ESOMEPRAZOLE MAGNESIUM) Take 1 tablet by mouth every morning  #100 x 4   Entered and Authorized by:   Roderick Pee MD   Signed by:   Roderick Pee MD on 02/21/2007   Method used:   Electronically sent to ...       [facility]  [address]  [phone]*       [address]       [place], [place]  [address]       Ph: [phone] or [phone]       Fax: [phone]   RxID:   [id number] ESTRACE 0.1 MG/GM  CREA (ESTRADIOL) UAD  #3 tubes x 10   Entered and Authorized by:   Roderick Pee MD   Signed by:   Roderick Pee MD on 02/21/2007   Method used:   Electronically sent to .Marland KitchenMarland Kitchen  [facility]  [address]  [phone]*       [address]       [place], [place]  [address]       Ph: [phone] or [phone]       Fax: [phone]   RxID:   [id number] METHOTREXATE 2.5 MG  TABS (METHOTREXATE SODIUM) q 12 hrs 3 x a wk  #36 x 4   Entered and Authorized by:   Roderick Pee MD   Signed by:   Roderick Pee MD on 02/21/2007   Method used:   Electronically sent to ...       [facility]  [address]  [phone]*       [address]       [place], [place]  [address]        Ph: [phone] or [phone]       Fax: [phone]   RxID:   [id number] KETOCONAZOLE 2 %  CREA (KETOCONAZOLE) mixed with flutica   .05%  #60 grms x 4   Entered and Authorized by:   Roderick Pee MD   Signed by:   Roderick Pee MD on 02/21/2007   Method used:   Electronically sent to ...       [facility]  [address]  [phone]*       [address]       [place], [place]  [address]       Ph: [phone] or [phone]       Fax: [phone]   RxID:   [id number] CLOBETASOL PROPIONATE 0.05 %  FOAM (CLOBETASOL PROPIONATE) as needed  #60 grms x 4   Entered and Authorized by:   Roderick Pee MD   Signed by:   Roderick Pee MD on 02/21/2007   Method used:   Electronically sent to ...       [facility]  [address]  [phone]*       [address]       [place], [place]  [address]       Ph: [phone] or [phone]       Fax: [phone]   RxID:   [id number] FLEXERIL 10 MG  TABS (CYCLOBENZAPRINE HCL) 1 at bedtime as needed  #100 x 4   Entered and Authorized by:   Roderick Pee MD   Signed by:   Roderick Pee MD on 02/21/2007   Method used:   Electronically sent to ...       [facility]  [address]  [phone]*       [address]       [place], [place]  [address]       Ph: [phone] or [phone]       Fax: [phone]   RxID:   [id number] IMITREX 50 MG  TABS (SUMATRIPTAN SUCCINATE) UAD  #6 x 11   Entered and Authorized by:   Roderick Pee MD   Signed by:   Roderick Pee MD on 02/21/2007   Method used:   Electronically sent to ...       [facility]  [address]  [phone]*       [address]       [place], [place]  [address]  Ph: [phone] or [phone]       Fax: [phone]   RxID:   [id number] CELEBREX 200 MG  CAPS (CELECOXIB) 1 once daily  #100 x 4   Entered and Authorized by:   Roderick Pee MD   Signed by:   Roderick Pee MD on  02/21/2007   Method used:   Electronically sent to ...       [facility]  [address]  [phone]*       [address]       [place], [place]  [address]       Ph: [phone] or [phone]       Fax: [phone]   RxID:   [id number] NASONEX 50 MCG/ACT  SUSP (MOMETASONE FUROATE) use as directed  #3 x 4   Entered and Authorized by:   Roderick Pee MD   Signed by:   Roderick Pee MD on 02/21/2007   Method used:   Electronically sent to ...       [facility]  [address]  [phone]*       [address]       [place], [place]  [address]       Ph: [phone] or [phone]       Fax: [phone]   RxID:   [id number]  ]

## 2010-02-19 NOTE — Assessment & Plan Note (Signed)
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  We saw her in February.  Her LDL was in the 170 range.  She changed her diet and struck down to 140 range.  Also, when she had her last rheumatology consult.  Her BP was elevated.  Also, her heart rate was a hundred.  Her rheumatologist suggested she stop the Celebrex, which she did not BP 120/78, heart rate 70 and regular.    Allergies: 1)  ! Talwin 2)  ! Demerol 3)  * Dovanex  Past History:  Past medical, surgical, family and social histories (including risk factors) reviewed for relevance to current acute and chronic problems.  Past Medical History: Reviewed history from 02/27/2008 and no changes required. MHA PSORIASIS Rheumatoid arthritis GERD OBESE PMS hypothyroidism senile vaginitis TAH/BSO  1997, prolapse.  No cancer childbirth x 2 tonsillectomy Hyperlipidemia  Past Surgical History: Reviewed history from 06/29/2006 and no changes required. HYST BSO BTL CBX2 T&A  Family History: Reviewed history from 02/21/2007 and no changes required. father died at 29, primary prostate cancer mother has depression, diabetes, hypertension, history of smoking, hypothyroidism, and breast cancer no brothers two sisters, one has hypothyroidism, and the other in good health  Social History: Reviewed history from 02/27/2008 and no changes required. 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  Review of Systems      See HPI  Physical Exam  General:  Well-developed,well-nourished,in no acute distress; alert,appropriate and cooperative throughout examination Heart:  120/78, with a pulse of 70   Impression & Recommendations:  Problem # 1:  HYPERLIPIDEMIA (ICD-272.4) Assessment Improved  Problem # 2:  ELEVATED BLOOD PRESSURE WITHOUT DIAGNOSIS OF HYPERTENSION (ICD-796.2) Assessment: New  Complete Medication List: 1)  Nasonex 50 Mcg/act Susp (Mometasone furoate) .... Use as directed 2)  Celebrex 200 Mg Caps (Celecoxib) .Marland Kitchen.. 1 once daily as needed 3)  Imitrex 50 Mg Tabs (Sumatriptan succinate) .... Uad 4)  Flexeril 10 Mg Tabs (Cyclobenzaprine hcl) .Marland Kitchen.. 1 at bedtime as needed 5)  Darvocet-n 100 100-650 Mg Tabs (Propoxyphene n-apap) .... Take 1 tablet by mouth three times a day 6)  Clobetasol Propionate 0.05 % Foam (Clobetasol propionate) .... As needed 7)  Ketoconazole 2 % Crea (Ketoconazole) .... Mixed with flutica   .05% 8)  Methotrexate 2.5 Mg Tabs (Methotrexate sodium) .... 8 tabs once daily 9)  Estrace 0.1 Mg/gm Crea (Estradiol) .... Uad 10)  Nexium 40 Mg Pack (Esomeprazole magnesium) .... Take 1 tablet by mouth every morning 11)  Zyrtec Allergy 10 Mg Tabs (Cetirizine hcl) .... As needed 12)  Fish Oil Concentrate 1000 Mg Caps (Omega-3 fatty acids) .... Once daily 13)  Glucosamine-chondroitin 1500-1200 Mg/47ml Liqd (Glucosamine-chondroitin) .... Once daily 14)  Caltrate 600+d 600-400 Mg-unit Tabs (Calcium carbonate-vitamin d) .... Once daily 15)  Folic Acid 800 Mcg Tabs (Folic acid) .... Once daily 16)  Synthroid 75  Mcg Tabs (Levothyroxine sodium) .... Take 1 tablet by mouth every morning 17)  Moviprep 100 Gm Solr (Peg-kcl-nacl-nasulf-na asc-c) .... As directed 18)  Promethazine Hcl 25 Mg Tabs (Promethazine hcl) .... Use as needed for headache 19)  Topamax 25 Mg Tabs (Topiramate) .... Take one tab at bedtime 20)  Vagifem 25 Mcg Tabs (Estradiol) .... Insert one 2 x week  Patient  Instructions: 1)  purchased a digital blood pressure cuff to monitor your blood pressure weekly at home. 2)  Continue the dietary program.  Its decreased.  Her lipids.  It would also be helpful.  Three.  Her overall health, lipids, and your joints to walk 20 minutes daily over above, your normal workday. 3)  Return in February, p.r.n.

## 2010-02-19 NOTE — Assessment & Plan Note (Signed)
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  She has a history of allergic rhinitis, for which she uses Nasonex nasal spray and OTC as her tachycardia.  She has a history of migraine headaches.  She takes Imitrex, and Topamax.  Her current Topamax dose is 50 mg.  She had 3 migraines in the past year.  We discussed options.  We will increase the Topamax.  She has underlying room at her arthritis.  She takes Flexeril, 10 mg nightly tramadol 50 mg dose two tabs q.a.m. and methotrexate via her rheumatologist.  She states she is functioning okay.  She takes Nexium 40 mg q.a.m. because of reflux esophagitis.  She's had a history of an esophageal stricture that had to be dilated.  She uses various creams for skin.  She also takes calcium and vitamin D folic acid.  She is not take aspirin daily because of her esophageal problems in the past.  She gets routine eye care.  Dental caries does BSE monthly and gets annual mammography.  Colonoscopy normal in GI.  Tetanus 2006.  Allergies: 1)  ! Talwin 2)  ! Demerol 3)  * Dovanex  Past History:  Past medical, surgical, family and social histories (including risk factors) reviewed, and no changes noted (except as noted below).  Past Medical History: Reviewed history from 02/27/2008 and no changes required. MHA PSORIASIS Rheumatoid arthritis GERD OBESE PMS hypothyroidism senile vaginitis TAH/BSO  1997, prolapse.  No cancer childbirth x 2 tonsillectomy Hyperlipidemia  Past Surgical  History: Reviewed history from 06/29/2006 and no changes required. HYST BSO BTL CBX2 T&A  Family History: Reviewed history from 02/21/2007 and no changes required. father died at 45, primary prostate cancer mother has depression, diabetes, hypertension, history of smoking, hypothyroidism, and breast cancer no brothers two sisters, one has hypothyroidism, and the other in good health  Social History: Reviewed history from 02/27/2008 and no changes required. 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  Review of Systems      See HPI  Physical Exam  General:  Well-developed,well-nourished,in no acute distress; alert,appropriate and cooperative throughout examination Head:  Normocephalic and atraumatic without obvious abnormalities. No apparent alopecia or balding. Eyes:  No corneal or conjunctival inflammation noted. EOMI. Perrla. Funduscopic exam benign, without hemorrhages, exudates or papilledema. Vision grossly normal. Ears:  External ear exam shows no significant lesions or deformities.  Otoscopic examination reveals clear canals, tympanic membranes are intact bilaterally without bulging, retraction, inflammation or discharge. Hearing is grossly normal bilaterally. Nose:  External nasal examination shows no deformity or inflammation. Nasal mucosa are pink and moist without lesions or exudates. Mouth:  Oral mucosa and oropharynx without lesions or exudates.  Teeth in good repair. Neck:  No deformities, masses, or tenderness noted. Chest Wall:  No deformities, masses, or tenderness noted. Breasts:  No mass, nodules, thickening, tenderness, bulging, retraction, inflamation, nipple  discharge or skin changes noted.   Lungs:  Normal respiratory effort, chest expands symmetrically. Lungs are clear to auscultation, no crackles or wheezes. Heart:  Normal rate and regular rhythm. S1 and S2  normal without gallop, murmur, click, rub or other extra sounds. Abdomen:  Bowel sounds positive,abdomen soft and non-tender without masses, organomegaly or hernias noted. Rectal:  No external abnormalities noted. Normal sphincter tone. No rectal masses or tenderness. Genitalia:  Pelvic Exam:        External: normal female genitalia without lesions or masses        Vagina: normal without lesions or masses        Cervix: normal without lesions or masses        Adnexa: normal bimanual exam without masses or fullness        Uterus: normal by palpation        Pap smear: not performed Msk:  No deformity or scoliosis noted of thoracic or lumbar spine.   Pulses:  R and L carotid,radial,femoral,dorsalis pedis and posterior tibial pulses are full and equal bilaterally Extremities:  No clubbing, cyanosis, edema, or deformity noted with normal full range of motion of all joints.   Neurologic:  No cranial nerve deficits noted. Station and gait are normal. Plantar reflexes are down-going bilaterally. DTRs are symmetrical throughout. Sensory, motor and coordinative functions appear intact. Skin:  Intact without suspicious lesions or rashes Cervical Nodes:  No lymphadenopathy noted Axillary Nodes:  No palpable lymphadenopathy Inguinal Nodes:  No significant adenopathy Psych:  Cognition and judgment appear intact. Alert and cooperative with normal attention span and concentration. No apparent delusions, illusions, hallucinations   Impression & Recommendations:  Problem # 1:  MIGRAINE HEADACHE (ICD-346.90) Assessment Improved  The following medications were removed from the medication list:    Darvocet-n 100 100-650 Mg Tabs (Propoxyphene n-apap) .Marland Kitchen... Take 1 tablet by mouth two times a day Her updated medication list for this problem includes:    Celebrex 200 Mg Caps (Celecoxib) .Marland Kitchen... 1 once daily as needed    Imitrex 50 Mg Tabs (Sumatriptan succinate) ..... Uad    Tramadol Hcl 50 Mg Tabs (Tramadol  hcl) .Marland Kitchen... Take 2 tabs every morning  Orders: Prescription Created Electronically [phone])  Problem # 2:  VAGINITIS, ATROPHIC (ICD-627.3) Assessment: Deteriorated  The following medications were removed from the medication list:    Vagifem 25 Mcg Tabs (Estradiol) ..... Insert one 2 x week Her updated medication list for this problem includes:    Estrace 0.1 Mg/gm Crea (Estradiol) ..... Uad  Orders: Prescription Created Electronically [phone])  Problem # 3:  PSORIASIS (ICD-696.1) Assessment: Unchanged  Orders: Prescription Created Electronically [phone])  Problem # 4:  UNSPECIFIED HYPOTHYROIDISM (ICD-244.9) Assessment: Improved  Her updated medication list for this problem includes:    Synthroid 75 Mcg Tabs (Levothyroxine sodium) .Marland Kitchen... Take 1 tablet by mouth every morning  Orders: Prescription Created Electronically [phone])  Problem # 5:  PHYSICAL EXAMINATION (ICD-V70.0) Assessment: Unchanged  Orders: Prescription Created Electronically [phone])  Problem # 6:  GERD (ICD-530.81) Assessment: Improved  Her updated medication list for this problem includes:    Nexium 40 Mg Pack (Esomeprazole magnesium) .Marland Kitchen... Take 1 tablet by mouth every morning  Orders: Prescription Created Electronically [phone])  Complete Medication List: 1)  Nasonex 50 Mcg/act Susp (Mometasone furoate) .... Use as directed 2)  Celebrex 200 Mg Caps (Celecoxib) .Marland Kitchen.. 1 once daily as needed 3)  Imitrex 50 Mg Tabs (Sumatriptan succinate) .... Uad 4)  Flexeril 10 Mg Tabs (Cyclobenzaprine hcl) .Marland KitchenMarland KitchenMarland Kitchen  1 at bedtime as needed 5)  Clobetasol Propionate 0.05 % Foam (Clobetasol propionate) .... As needed 6)  Ketoconazole 2 % Crea (Ketoconazole) .... Mixed with flutica   .05% 7)  Methotrexate 2.5 Mg Tabs (Methotrexate sodium) .... 8 tabs once daily 8)  Estrace 0.1 Mg/gm Crea (Estradiol) .... Uad 9)  Nexium 40 Mg Pack (Esomeprazole magnesium) .... Take 1 tablet by mouth every morning 10)  Zyrtec Allergy 10 Mg Tabs  (Cetirizine hcl) .... As needed 11)  Fish Oil Concentrate 1000 Mg Caps (Omega-3 fatty acids) .... Once daily 12)  Glucosamine-chondroitin 1500-1200 Mg/18ml Liqd (Glucosamine-chondroitin) .... Once daily 13)  Caltrate 600+d 600-400 Mg-unit Tabs (Calcium carbonate-vitamin d) .... Once daily 14)  Folic Acid 800 Mcg Tabs (Folic acid) .... Once daily 15)  Synthroid 75 Mcg Tabs (Levothyroxine sodium) .... Take 1 tablet by mouth every morning 16)  Promethazine Hcl 25 Mg Tabs (Promethazine hcl) .... Use as needed for headache 17)  Tramadol Hcl 50 Mg Tabs (Tramadol hcl) .... Take 2 tabs every morning 18)  Topamax 100 Mg Tabs (Topiramate) .Marland Kitchen.. 1 tab @ bedtime  Other Orders: EKG w/ Interpretation ([id number])  Patient Instructions: 1)  increase the Topamax to 100 mg at bedtime to see if we can get her migraine headaches to stop. 2)  Use small amounts of the Premarin vaginal cream twice weekly. 3)  Please schedule a follow-up appointment in 1 year. 4)  It is important that you exercise regularly at least 20 minutes 5 times a week. If you develop chest pain, have severe difficulty breathing, or feel very tired , stop exercising immediately and seek medical attention. 5)  Schedule a colonoscopy/sigmoidoscopy to help detect colon cancer. 6)  Take calcium +Vitamin D daily. Prescriptions: FLEXERIL 10 MG  TABS (CYCLOBENZAPRINE HCL) 1 at bedtime as needed  #100 x 4   Entered and Authorized by:   Roderick Pee MD   Signed by:   Roderick Pee MD on 04/01/2009   Method used:   Print then Give to Patient   RxID:   [id number] PROMETHAZINE HCL 25 MG TABS (PROMETHAZINE HCL) use as needed for headache  #30 x 1   Entered and Authorized by:   Roderick Pee MD   Signed by:   Roderick Pee MD on 04/01/2009   Method used:   Electronically to        [facility] (retail)       [address]       [place], [place]  [address]       Ph: [phone]       Fax: [phone]   RxID:    [id number] SYNTHROID 75 MCG TABS (LEVOTHYROXINE SODIUM) Take 1 tablet by mouth every morning  #100 x 3   Entered and Authorized by:   Roderick Pee MD   Signed by:   Roderick Pee MD on 04/01/2009   Method used:   Electronically to        [facility] (retail)       [address]       [place], [place]  [address]       Ph: [phone]       Fax: [phone]   RxID:   [id number] NEXIUM 40 MG  PACK (ESOMEPRAZOLE MAGNESIUM) Take 1 tablet by mouth every morning  #100 x 3   Entered and Authorized  by:   Roderick Pee MD   Signed by:   Roderick Pee MD on 04/01/2009   Method used:   Electronically to        [facility] (retail)       [address]       [place], [place]  [address]       Ph: [phone]       Fax: [phone]   RxID:   [id number] ESTRACE 0.1 MG/GM  CREA (ESTRADIOL) UAD  #3 tubes x 10   Entered and Authorized by:   Roderick Pee MD   Signed by:   Roderick Pee MD on 04/01/2009   Method used:   Electronically to        [facility] (retail)       [address]       [place], [place]  [address]       Ph: [phone]       Fax: [phone]   RxID:   [id number] KETOCONAZOLE 2 %  CREA (KETOCONAZOLE) mixed with flutica   .05%  #60 grms x 4   Entered and Authorized by:   Roderick Pee MD   Signed by:   Roderick Pee MD on 04/01/2009   Method used:   Electronically to        [facility] [address] (retail)       [address]       [place], [place]  [address]       Ph: [phone]       Fax: [phone]   RxID:   [id number] CLOBETASOL PROPIONATE 0.05 %  FOAM (CLOBETASOL PROPIONATE) as needed  #60 grms x 4   Entered and Authorized by:   Roderick Pee MD   Signed by:   Roderick Pee MD on 04/01/2009   Method used:   Electronically to        [facility] (retail)       [address]       [place], [place]  [address]       Ph: [phone]       Fax: [phone]   RxID:   [id number] IMITREX 50 MG  TABS (SUMATRIPTAN SUCCINATE) UAD  #9 Tablet x 0   Entered and Authorized by:   Roderick Pee MD   Signed by:   Roderick Pee MD on 04/01/2009   Method used:   Electronically to        [facility] [address] (retail)       [address]       [place], [place]  [address]       Ph: [phone]       Fax: [phone]   RxID:   [id number] NASONEX 50 MCG/ACT  SUSP (MOMETASONE FUROATE) use as directed  #3 x 4   Entered and Authorized by:   Roderick Pee MD   Signed by:   Roderick Pee MD on 04/01/2009   Method used:   Electronically to        [facility] (retail)       [address]       [place], [place]  [address]       Ph: [phone]       Fax: [phone]   RxID:   [id number] TOPAMAX 100 MG TABS (TOPIRAMATE) 1 tab @ bedtime  #100 x 3   Entered and Authorized by:   Roderick Pee MD   Signed by:   Roderick Pee MD on 04/01/2009   Method used:   Electronically to        [facility] (retail)       [address]       [place], [place]  [address]       Ph: [phone]       Fax: [phone]   RxID:   [id number]

## 2010-02-19 NOTE — Assessment & Plan Note (Signed)
Summary: bladder infection/jls   Vital Signs:  Patient Profile:   55 Years Old Female Height:     68 inches (172.72 cm) Weight:      214 pounds Temp:     98.4 degrees F oral BP sitting:   134 / 96  (left arm)                 Chief Complaint:  uti.  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  She said no, fever, chills, or back pain.  She said no nausea, vomiting, or diarrhea.  Her last urine tract infection was 26 years ago.    Current Allergies: ! TALWIN ! 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    Impression & Recommendations:  Problem # 1:  ACUTE CYSTITIS (ICD-595.0) Assessment: New  Her updated medication list for this problem includes:    Septra Ds 800-160 Mg Tabs (Sulfamethoxazole-trimethoprim) .Marland Kitchen... Take 1 tablet by mouth two times a day  Orders: UA Dipstick w/o Micro (manual) ([address])   Complete Medication List: 1)  Nasonex 50 Mcg/act Susp (Mometasone furoate) .... Use as directed 2)  Celebrex 200 Mg Caps (Celecoxib) .Marland Kitchen.. 1 once daily 3)  Imitrex 50 Mg Tabs (Sumatriptan succinate) .... Uad 4)  Flexeril 10 Mg Tabs (Cyclobenzaprine hcl) .Marland Kitchen.. 1 at bedtime as needed 5)  Darvocet-n 100 100-650 Mg Tabs (Propoxyphene n-apap) .... As needed 6)  Clobetasol Propionate 0.05 % Foam (Clobetasol propionate) .... As needed 7)  Ketoconazole 2 % Crea  (Ketoconazole) .... Mixed with flutica   .05% 8)  Methotrexate 2.5 Mg Tabs (Methotrexate sodium) .... Q 12 hrs 3 x a wk 9)  Estrace 0.1 Mg/gm Crea (Estradiol) .... Uad 10)  Nexium 40 Mg Pack (Esomeprazole magnesium) .... Take 1 tablet by mouth every morning 11)  Synthroid 50 Mcg Tabs (Levothyroxine sodium) .Marland Kitchen.. 1 tab  every morning; need f/u tsh in 2 months 12)  Zyrtec Allergy 10 Mg Tabs (Cetirizine hcl) .... As needed 13)  Fish Oil Concentrate 1000 Mg Caps (Omega-3 fatty acids) .... Once daily 14)  Glucosamine-chondroitin 1500-1200 Mg/76ml Liqd (Glucosamine-chondroitin) .... Once daily 15)  Caltrate 600+d 600-400 Mg-unit Tabs (Calcium carbonate-vitamin d) .... Once daily 16)  Septra Ds 800-160 Mg Tabs (Sulfamethoxazole-trimethoprim) .... Take 1 tablet by mouth two times a day   Patient Instructions: 1)  begin Septra DS one twice a day for 10 days.  I will give you some extra travel tablets just in case you have a flare, why you're gone   Prescriptions: SEPTRA DS 800-160 MG  TABS (SULFAMETHOXAZOLE-TRIMETHOPRIM) Take 1 tablet by mouth two times a day  #40 x 1   Entered and Authorized by:   Roderick Pee MD   Signed by:  Roderick Pee MD on 06/08/2007   Method used:   Electronically sent to ...       [facility]       [address]       [place], [place]  [address]       Ph: [phone]       Fax: [phone]   RxID:   [id number] SEPTRA DS 800-160 MG  TABS (SULFAMETHOXAZOLE-TRIMETHOPRIM) Take 1 tablet by mouth two times a day  #40 x 1   Entered and Authorized by:   Roderick Pee MD   Signed by:   Roderick Pee MD on 06/08/2007   Method used:   Electronically sent to ...       [facility]       [address]       [place], [place]  [address]       Ph: [phone]       Fax: [phone]   RxID:   [id number]  ] Laboratory Results   Urine Tests    Routine Urinalysis   Glucose: negative   (Normal  Range: Negative) Bilirubin: negative   (Normal Range: Negative) Ketone: negative   (Normal Range: Negative) Spec. Gravity: 1.015   (Normal Range: 1.003-1.035) Blood: negative   (Normal Range: Negative) pH: 5.0   (Normal Range: 5.0-8.0) Protein: trace   (Normal Range: Negative) Urobilinogen: 0.2   (Normal Range: 0-1) Nitrite: positive   (Normal Range: Negative) Leukocyte Esterace: small   (Normal Range: Negative)

## 2010-02-19 NOTE — Progress Notes (Signed)
Summary: RX REFILL  Phone Note Call from Patient Call back at [phone] EXT 6   Caller: pt  Call For: Cheryl Knight Summary of Call: PATIENT NEEDS RX REFILL FOR DARVOCET N100MG .  [facility] [phone] Initial call taken by: Celine Ahr,  January 10, 2008 12:48 PM  Follow-up for Phone Call        Darvocet-N 100, dispense 200 pills directions one to two tablets t.i.d. p.r.n. pain 3 refills Follow-up by: Roderick Pee MD,  January 11, 2008 9:06 AM  Additional Follow-up for Phone Call Additional follow up Details #1::        rx called in Additional Follow-up by: Kern Reap CMA,  January 11, 2008 10:05 AM

## 2010-02-19 NOTE — Progress Notes (Signed)
  Phone Note Outgoing Call   Call placed by: jat Call placed to: Patient Reason for Call: Discuss lab or test results Summary of Call: call patient TSH normal.  Continue current dose, yearly lab work with annual checkup Initial call taken by: Roderick Pee MD,  May 03, 2007 8:29 AM

## 2010-02-19 NOTE — Procedures (Signed)
Summary: Colonoscopy   Colonoscopy  Procedure date:  03/05/2008  Findings:      Location:  [facility].    Procedures Next Due Date:    Colonoscopy: 03/2018  COLONOSCOPY PROCEDURE REPORT  PATIENT:  Cheryl Knight, Cheryl Knight  MR#:  [id number] BIRTHDATE:   06-13-1955   GENDER:   female  ENDOSCOPIST:   Hedwig Morton. Juanda Chance, MD Referred by: Eugenio Hoes Tawanna Cooler, M.D.  PROCEDURE DATE:  03/05/2008 PROCEDURE:  Colonoscopy, diagnostic ASA CLASS:   Class I INDICATIONS: screening   MEDICATIONS:    Versed 8 mg, Fentanyl 75 mcg  DESCRIPTION OF PROCEDURE:   After the risks benefits and alternatives of the procedure were thoroughly explained, informed consent was obtained.  Digital rectal exam was performed and revealed no rectal masses.   The LB [id number] endoscope was introduced through the anus and advanced to the cecum, which was identified by both the appendix and ileocecal valve, without limitations.  The quality of the prep was excellent, using MoviPrep.  The instrument was then slowly withdrawn as the colon was fully examined. <<PROCEDUREIMAGES>>    <<OLD IMAGES>>  FINDINGS:  Mild diverticulosis was found throughout the colon. few scattered diverticuli left and right colon  This was otherwise a normal examination (see image1, image2, and image3).   Retroflexed views in the rectum revealed no abnormalities.    The scope was then withdrawn from the patient and the procedure completed.  COMPLICATIONS:   None  ENDOSCOPIC IMPRESSION:  1) Mild diverticulosis throughout the colon  2) Otherwise normal examination  no polyps RECOMMENDATIONS:  1) hemoccults q 2-3 years  2) high fiber diet  REPEAT EXAM:   In 10 year(s) for.   _______________________________ Hedwig Morton. Juanda Chance, MD  CC: Eugenio Hoes. Tawanna Cooler, MD

## 2010-02-19 NOTE — Miscellaneous (Signed)
Summary: mammogram update  Clinical Lists Changes  Observations: Added new observation of HPV-DNA: normal (11/04/2009 11:47)      Preventive Care Screening  HPV-DNA:    Date:  11/04/2009    Results:  normal

## 2010-02-19 NOTE — Progress Notes (Signed)
Summary: propoxy refill  Phone Note From Pharmacy   Summary of Call: patient is requesting a refill of propoxy apap 100/650.  last office visit 7/10 is this okay to fill? Initial call taken by: Kern Reap CMA Duncan Dull),  August 26, 2008 9:51 AM  Follow-up for Phone Call        refill Darvocet-N 100 for a one year time span, please call patient to find out how many she is taking on a regular basis Follow-up by: Roderick Pee MD,  August 26, 2008 10:01 AM  Additional Follow-up for Phone Call Additional follow up Details #1::        left message on machine to return our call Additional Follow-up by: Kern Reap CMA Duncan Dull),  August 26, 2008 12:37 PM    Additional Follow-up for Phone Call Additional follow up Details #2::    taking darvocet once daily  Follow-up by: Kern Reap CMA (AAMA),  August 26, 2008 4:53 PM  New/Updated Medications: DARVOCET-N 100 100-650 MG TABS (PROPOXYPHENE N-APAP) Take 1 tablet by mouth two times a day Prescriptions: DARVOCET-N 100 100-650 MG TABS (PROPOXYPHENE N-APAP) Take 1 tablet by mouth two times a day  #180 x 3   Entered by:   Kern Reap CMA (AAMA)   Authorized by:   Roderick Pee MD   Signed by:   Kern Reap CMA (AAMA) on 08/26/2008   Method used:   Telephoned to ...       [facility]  [address]  [phone]* (retail)       [address]       [place], [place]  [address]       Ph: [phone] or [phone]       Fax: [phone]   RxID:   [id number]   Appended Document: propoxy refill [facility] and [facility]

## 2010-04-02 ENCOUNTER — Other Ambulatory Visit: Payer: Self-pay | Admitting: Family Medicine

## 2010-04-03 ENCOUNTER — Other Ambulatory Visit: Payer: Self-pay | Admitting: *Deleted

## 2010-04-03 MED ORDER — CLOBETASOL PROPIONATE 0.05 % EX FOAM: Freq: Two times a day (BID) | CUTANEOUS | Status: DC

## 2010-04-07 ENCOUNTER — Other Ambulatory Visit: Payer: Self-pay | Admitting: *Deleted

## 2010-04-07 MED ORDER — MOMETASONE FUROATE 50 MCG/ACT NA SUSP: 2.0000 | Freq: Every day | NASAL | Status: DC

## 2010-04-08 ENCOUNTER — Other Ambulatory Visit: Payer: Self-pay | Admitting: Family Medicine

## 2010-05-09 ENCOUNTER — Other Ambulatory Visit: Payer: Self-pay | Admitting: Family Medicine

## 2010-05-17 ENCOUNTER — Other Ambulatory Visit: Payer: Self-pay | Admitting: Family Medicine

## 2010-06-02 ENCOUNTER — Other Ambulatory Visit (INDEPENDENT_AMBULATORY_CARE_PROVIDER_SITE_OTHER): Payer: 59

## 2010-06-02 DIAGNOSIS — Z0000 Encounter for general adult medical examination without abnormal findings: Secondary | ICD-10-CM

## 2010-06-02 DIAGNOSIS — Z13220 Encounter for screening for lipoid disorders: Secondary | ICD-10-CM

## 2010-06-02 LAB — HEPATIC FUNCTION PANEL
ALT: 13 U/L (ref 0–35)
AST: 19 U/L (ref 0–37)
Albumin: 3.7 g/dL (ref 3.5–5.2)
Alkaline Phosphatase: 48 U/L (ref 39–117)
Bilirubin, Direct: 0.1 mg/dL (ref 0.0–0.3)
Total Bilirubin: 0.5 mg/dL (ref 0.3–1.2)
Total Protein: 6.5 g/dL (ref 6.0–8.3)

## 2010-06-02 LAB — TSH: TSH: 1.12 u[IU]/mL (ref 0.35–5.50)

## 2010-06-02 LAB — CBC WITH DIFFERENTIAL/PLATELET
Basophils Absolute: 0.1 10*3/uL (ref 0.0–0.1)
Basophils Relative: 0.9 % (ref 0.0–3.0)
Eosinophils Absolute: 0.2 10*3/uL (ref 0.0–0.7)
Eosinophils Relative: 2.9 % (ref 0.0–5.0)
HCT: 40.7 % (ref 36.0–46.0)
Hemoglobin: 13.8 g/dL (ref 12.0–15.0)
Lymphocytes Relative: 36.9 % (ref 12.0–46.0)
Lymphs Abs: 2 10*3/uL (ref 0.7–4.0)
MCHC: 33.9 g/dL (ref 30.0–36.0)
MCV: 95 fl (ref 78.0–100.0)
Monocytes Absolute: 0.5 10*3/uL (ref 0.1–1.0)
Monocytes Relative: 8.4 % (ref 3.0–12.0)
Neutro Abs: 2.8 10*3/uL (ref 1.4–7.7)
Neutrophils Relative %: 50.9 % (ref 43.0–77.0)
Platelets: 264 10*3/uL (ref 150.0–400.0)
RBC: 4.28 Mil/uL (ref 3.87–5.11)
RDW: 14.2 % (ref 11.5–14.6)
WBC: 5.5 10*3/uL (ref 4.5–10.5)

## 2010-06-02 LAB — LIPID PANEL
Cholesterol: 214 mg/dL — ABNORMAL HIGH (ref 0–200)
HDL: 37.6 mg/dL — ABNORMAL LOW (ref >39.00)
Total CHOL/HDL Ratio: 6
Triglycerides: 174 mg/dL — ABNORMAL HIGH (ref 0.0–149.0)
VLDL: 34.8 mg/dL (ref 0.0–40.0)

## 2010-06-02 LAB — POCT URINALYSIS DIPSTICK
Bilirubin, UA: NEGATIVE
Blood, UA: NEGATIVE
Glucose, UA: NEGATIVE
Ketones, UA: NEGATIVE
Nitrite, UA: NEGATIVE
Protein, UA: NEGATIVE
Spec Grav, UA: 1.015
Urobilinogen, UA: 0.2
pH, UA: 7

## 2010-06-02 LAB — BASIC METABOLIC PANEL
BUN: 15 mg/dL (ref 6–23)
CO2: 26 mEq/L (ref 19–32)
Calcium: 9.2 mg/dL (ref 8.4–10.5)
Chloride: 109 mEq/L (ref 96–112)
Creatinine, Ser: 0.7 mg/dL (ref 0.4–1.2)
GFR: 87.42 mL/min (ref >60.00)
Glucose, Bld: 95 mg/dL (ref 70–99)
Potassium: 4.4 mEq/L (ref 3.5–5.1)
Sodium: 143 mEq/L (ref 135–145)

## 2010-06-02 LAB — LDL CHOLESTEROL, DIRECT: Direct LDL: 152.6 mg/dL

## 2010-06-09 ENCOUNTER — Ambulatory Visit: Payer: Self-pay | Admitting: Family Medicine

## 2010-06-14 ENCOUNTER — Other Ambulatory Visit: Payer: Self-pay | Admitting: Family Medicine

## 2010-06-18 ENCOUNTER — Other Ambulatory Visit: Payer: Self-pay | Admitting: *Deleted

## 2010-06-18 MED ORDER — SUMATRIPTAN SUCCINATE 50 MG PO TABS: 50.0000 mg | ORAL_TABLET | ORAL | Status: DC | PRN

## 2010-07-07 ENCOUNTER — Ambulatory Visit (INDEPENDENT_AMBULATORY_CARE_PROVIDER_SITE_OTHER): Payer: 59 | Admitting: Family Medicine

## 2010-07-07 ENCOUNTER — Encounter: Payer: Self-pay | Admitting: Family Medicine

## 2010-07-07 VITALS — BP 140/98 | Temp 97.9°F | Ht 67.0 in | Wt 206.0 lb

## 2010-07-07 DIAGNOSIS — G43909 Migraine, unspecified, not intractable, without status migrainosus: Secondary | ICD-10-CM

## 2010-07-07 DIAGNOSIS — L408 Other psoriasis: Secondary | ICD-10-CM

## 2010-07-07 DIAGNOSIS — M069 Rheumatoid arthritis, unspecified: Secondary | ICD-10-CM

## 2010-07-07 DIAGNOSIS — E039 Hypothyroidism, unspecified: Secondary | ICD-10-CM

## 2010-07-07 DIAGNOSIS — Z0000 Encounter for general adult medical examination without abnormal findings: Secondary | ICD-10-CM

## 2010-07-07 DIAGNOSIS — K219 Gastro-esophageal reflux disease without esophagitis: Secondary | ICD-10-CM

## 2010-07-07 DIAGNOSIS — N952 Postmenopausal atrophic vaginitis: Secondary | ICD-10-CM

## 2010-07-07 MED ORDER — ESTROGENS, CONJUGATED 0.625 MG/GM VA CREA: 0.5000 g | TOPICAL_CREAM | Freq: Every day | VAGINAL | Status: AC

## 2010-07-07 MED ORDER — MOMETASONE FUROATE 50 MCG/ACT NA SUSP: 2.0000 | Freq: Every day | NASAL | Status: DC

## 2010-07-07 MED ORDER — TOPIRAMATE 100 MG PO TABS: 100.0000 mg | ORAL_TABLET | Freq: Every day | ORAL | Status: DC

## 2010-07-07 MED ORDER — CYCLOBENZAPRINE HCL 10 MG PO TABS: 10.0000 mg | ORAL_TABLET | ORAL | Status: DC

## 2010-07-07 MED ORDER — SUMATRIPTAN SUCCINATE 50 MG PO TABS: 50.0000 mg | ORAL_TABLET | ORAL | Status: DC | PRN

## 2010-07-07 MED ORDER — LEVOTHYROXINE SODIUM 75 MCG PO TABS: 75.0000 ug | ORAL_TABLET | Freq: Every day | ORAL | Status: DC

## 2010-07-07 MED ORDER — CLOBETASOL PROPIONATE 0.05 % EX FOAM: Freq: Two times a day (BID) | CUTANEOUS | Status: AC

## 2010-07-07 MED ORDER — ESOMEPRAZOLE MAGNESIUM 40 MG PO CPDR: 40.0000 mg | DELAYED_RELEASE_CAPSULE | Freq: Every day | ORAL | Status: DC

## 2010-07-07 MED ORDER — HYDROCODONE-ACETAMINOPHEN 7.5-750 MG PO TABS: ORAL_TABLET | ORAL | Status: DC

## 2010-07-07 NOTE — Patient Instructions (Signed)
Continue current medications  Check her blood pressure daily for 3 weeks and then fax me the data at [phone].  Remember to do your walking daily.  Follow-up one year or sooner if any problems.  Vicodin one half to one tablet p.r.n. For breakthrough migraine

## 2010-07-07 NOTE — Progress Notes (Signed)
  Subjective:    Patient ID: Cheryl Knight, female    DOB: 10-29-1955, 55 y.o.   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  She has a history of chronic back pain and rheumatoid arthritis.  She currently takes Flexeril, 10 mg nightly, tramadol, two tabs b.i.d., methotrexate, a tabs weekly by her rheumatologist.  She takes Synthroid 75 mcg daily for hypothyroidism.  She is a steroid nasal spray p.r.n. For allergic rhinitis.  She is Nexium 40 mg daily for reflux esophagitis.  She also uses steroid foam because of a scalp psoriasis.  Her migraines are fairly quiet now they wake up in the middle night with them.  Imitrex does not help.  She gets routine eye care, dental care, BSE monthly, and he mammography, colonoscopy, normal.  Tetanus 2006    Review of Systems  Constitutional: Negative.   HENT: Negative.   Eyes: Negative.   Respiratory: Negative.   Cardiovascular: Negative.   Gastrointestinal: Negative.   Genitourinary: Negative.   Musculoskeletal: Negative.   Neurological: Negative.   Hematological: Negative.   Psychiatric/Behavioral: Negative.        Objective:   Physical Exam  Constitutional: She appears well-developed and well-nourished.  HENT:  Head: Normocephalic and atraumatic.  Right Ear: External ear normal.  Left Ear: External ear normal.  Nose: Nose normal.  Mouth/Throat: Oropharynx is clear and moist.  Eyes: EOM are normal. Pupils are equal, round, and reactive to light.  Neck: Normal range of motion. Neck supple. No thyromegaly present.  Cardiovascular: Normal rate, regular rhythm, normal heart sounds and intact distal pulses.  Exam reveals no gallop and no friction rub.   No murmur heard. Pulmonary/Chest: Effort normal and breath sounds normal.  Abdominal: Soft. Bowel sounds are normal. She exhibits no distension and no mass. There is  no tenderness. There is no rebound.  Genitourinary: Vagina normal and uterus normal. Guaiac negative stool. No vaginal discharge found.       Bilateral breast exam normal  Musculoskeletal: Normal range of motion.  Lymphadenopathy:    She has no cervical adenopathy.  Neurological: She is alert. She has normal reflexes. No cranial nerve deficit. She exhibits normal muscle tone. Coordination normal.  Skin: Skin is warm and dry.  Psychiatric: She has a normal mood and affect. Her behavior is normal. Judgment and thought content normal.          Assessment & Plan:  Chronic back pain continue Flexeril, 10 mg nightly  Hypothyroidism continue Synthroid 75 mcg daily.  Allergic rhinitis.  Continue steroid nasal spray.  Reflux esophagitis.  Continue Nexium 40 daily.  Migraine headaches Imitrex p.r.n., Vicodin, one half tab p.r.n.  Also, continue the Topamax for the migraines, 100 mg daily.  Rheumatoid arthritis, followed by rheumatology on methotrexate, and tramadol, two tabs b.i.d.  Postmenopausal vaginal dryness.  Continue the Premarin vaginal cream.  Borderline blood pressure check.  BP daily q.a.m., fax me dated in 3 weeks

## 2010-07-08 ENCOUNTER — Telehealth: Payer: Self-pay | Admitting: Family Medicine

## 2010-07-08 NOTE — Telephone Encounter (Signed)
Pharmacy has questions re: Vicodin ES. Need specific instructions.How long is med suppose to last pt? Need this info in order to bill pts insurance.

## 2010-07-08 NOTE — Telephone Encounter (Signed)
Mitchell please call [name] and find out what the issue is I gave her some Vicodin to take one half tab nightly p.r.n. For migraine headache

## 2010-07-08 NOTE — Telephone Encounter (Signed)
Tried to call but line was busy

## 2010-07-09 NOTE — Telephone Encounter (Signed)
Left message on machine for patient  To return our call 

## 2010-07-10 NOTE — Telephone Encounter (Signed)
patient  Is aware 

## 2010-08-11 ENCOUNTER — Telehealth: Payer: Self-pay | Admitting: *Deleted

## 2010-08-11 NOTE — Telephone Encounter (Signed)
Pt's BP reading have been averaging 170/102 is the highest, and the lowest 127/82.  Asking what to do.  [address] [place])  [phone]

## 2010-08-11 NOTE — Telephone Encounter (Signed)
If her blood pressure is not at goal 135/85, then I need to see her here in the office on Thursday

## 2010-08-11 NOTE — Telephone Encounter (Signed)
Left message on machine for patient  To call back for appointment.

## 2010-09-01 ENCOUNTER — Ambulatory Visit (INDEPENDENT_AMBULATORY_CARE_PROVIDER_SITE_OTHER): Payer: 59 | Admitting: Family Medicine

## 2010-09-01 ENCOUNTER — Encounter: Payer: Self-pay | Admitting: Family Medicine

## 2010-09-01 VITALS — BP 142/102 | Temp 98.4°F | Wt 211.0 lb

## 2010-09-01 DIAGNOSIS — G8929 Other chronic pain: Secondary | ICD-10-CM

## 2010-09-01 DIAGNOSIS — I10 Essential (primary) hypertension: Secondary | ICD-10-CM | POA: Insufficient documentation

## 2010-09-01 DIAGNOSIS — M25519 Pain in unspecified shoulder: Secondary | ICD-10-CM

## 2010-09-01 MED ORDER — LISINOPRIL-HYDROCHLOROTHIAZIDE 20-12.5 MG PO TABS: 1.0000 | ORAL_TABLET | Freq: Every day | ORAL | Status: DC

## 2010-09-01 NOTE — Patient Instructions (Signed)
Begin the Zestoretic one half tablet daily.  Check your blood pressure daily in the morning.  Return in two weeks for follow-up.  We will also set you up to see Jeanene Erb, and [facility] orthopedic physical therapy, division for violation and treatment of the soreness and stiffness in her left shoulder

## 2010-09-01 NOTE — Progress Notes (Signed)
  Subjective:    Patient ID: Cheryl Knight, female    DOB: [birth year], 55 y.o.   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  For the past 3, years she's noticed her blood pressure been elevated, ever she's been able to get it down with diet and exercise.  Now her blood pressure will not normalize.  Is running 140 over hundred.  Her mother had hypertension.  She has continued problems with her left shoulder and it starting hunting season, and she would like to think about some therapy    Review of Systems General cardiovascular, orthopedic review of systems otherwise negative    Objective:   Physical Exam Well-developed well-nourished, female in no acute distress.  Cardiac exam normal.  Abdominal exam normal.  No renal bruits.  BP 150/90 right arm sitting position.  Limitation of range of motion of left shoulder       Assessment & Plan:  Hypertension add Zestoretic one half tab daily follow-up BP in one month.  Osteoarthritis and rheumatoid arthritis, now with stiffness, left shoulder refer to physical therapy, Cheryl Knight

## 2010-09-15 ENCOUNTER — Encounter: Payer: Self-pay | Admitting: Family Medicine

## 2010-09-15 ENCOUNTER — Ambulatory Visit (INDEPENDENT_AMBULATORY_CARE_PROVIDER_SITE_OTHER): Payer: 59 | Admitting: Family Medicine

## 2010-09-15 DIAGNOSIS — I10 Essential (primary) hypertension: Secondary | ICD-10-CM

## 2010-09-15 NOTE — Progress Notes (Signed)
  Subjective:    Patient ID: Cheryl Knight, female    DOB: [birth year], 55 y.o.   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  Her blood pressure was elevated consistently.  We therefore started her on Zestoretic 20 -- 12.5 dose one half tab nightly now.  Her blood pressure is 130/80.  BP today here 130/90, but she is in pain with a broken tooth.  Again, home BPs 130/80.  No side effects from medication, specifically, no rash no hives no cough    Review of Systems General and cardiovascular review of systems negative    Objective:   Physical Exam  Well-developed well-nourished, female, in no acute distress.  BP 130/80      Assessment & Plan:  Hypertension and goal continues, Zestoretic one half tab q.a.m., BP checks weekly at home.  Return p.r.n.

## 2010-09-15 NOTE — Patient Instructions (Signed)
Continue a half a tablet a day of the Zestoretic.  Check your blood pressure weekly.  Return p.r.n.

## 2011-05-12 ENCOUNTER — Other Ambulatory Visit: Payer: Self-pay | Admitting: Family Medicine

## 2011-06-25 ENCOUNTER — Other Ambulatory Visit: Payer: Self-pay | Admitting: Family Medicine

## 2011-07-27 ENCOUNTER — Other Ambulatory Visit: Payer: Self-pay | Admitting: Family Medicine

## 2011-09-12 ENCOUNTER — Other Ambulatory Visit: Payer: Self-pay | Admitting: Family Medicine

## 2011-09-14 ENCOUNTER — Encounter: Payer: Self-pay | Admitting: Family Medicine

## 2011-09-14 ENCOUNTER — Ambulatory Visit (INDEPENDENT_AMBULATORY_CARE_PROVIDER_SITE_OTHER): Payer: 59 | Admitting: Family Medicine

## 2011-09-14 VITALS — BP 112/70 | Temp 98.0°F | Ht 67.0 in | Wt 211.0 lb

## 2011-09-14 DIAGNOSIS — L408 Other psoriasis: Secondary | ICD-10-CM

## 2011-09-14 DIAGNOSIS — E785 Hyperlipidemia, unspecified: Secondary | ICD-10-CM

## 2011-09-14 DIAGNOSIS — M069 Rheumatoid arthritis, unspecified: Secondary | ICD-10-CM

## 2011-09-14 DIAGNOSIS — K219 Gastro-esophageal reflux disease without esophagitis: Secondary | ICD-10-CM

## 2011-09-14 DIAGNOSIS — M3500 Sicca syndrome, unspecified: Secondary | ICD-10-CM

## 2011-09-14 DIAGNOSIS — E039 Hypothyroidism, unspecified: Secondary | ICD-10-CM

## 2011-09-14 DIAGNOSIS — G43909 Migraine, unspecified, not intractable, without status migrainosus: Secondary | ICD-10-CM

## 2011-09-14 DIAGNOSIS — I10 Essential (primary) hypertension: Secondary | ICD-10-CM

## 2011-09-14 DIAGNOSIS — Z0000 Encounter for general adult medical examination without abnormal findings: Secondary | ICD-10-CM

## 2011-09-14 LAB — CBC WITH DIFFERENTIAL/PLATELET
Basophils Absolute: 0 10*3/uL (ref 0.0–0.1)
Basophils Relative: 0.8 % (ref 0.0–3.0)
Eosinophils Absolute: 0.1 10*3/uL (ref 0.0–0.7)
Eosinophils Relative: 2.7 % (ref 0.0–5.0)
HCT: 43 % (ref 36.0–46.0)
Hemoglobin: 14.1 g/dL (ref 12.0–15.0)
Lymphocytes Relative: 39.1 % (ref 12.0–46.0)
Lymphs Abs: 2.2 10*3/uL (ref 0.7–4.0)
MCHC: 32.8 g/dL (ref 30.0–36.0)
MCV: 95.9 fl (ref 78.0–100.0)
Monocytes Absolute: 0.5 10*3/uL (ref 0.1–1.0)
Monocytes Relative: 8.7 % (ref 3.0–12.0)
Neutro Abs: 2.7 10*3/uL (ref 1.4–7.7)
Neutrophils Relative %: 48.7 % (ref 43.0–77.0)
Platelets: 284 10*3/uL (ref 150.0–400.0)
RBC: 4.48 Mil/uL (ref 3.87–5.11)
RDW: 13.5 % (ref 11.5–14.6)
WBC: 5.5 10*3/uL (ref 4.5–10.5)

## 2011-09-14 LAB — BASIC METABOLIC PANEL
BUN: 15 mg/dL (ref 6–23)
CO2: 24 mEq/L (ref 19–32)
Calcium: 10.5 mg/dL (ref 8.4–10.5)
Chloride: 105 mEq/L (ref 96–112)
Creatinine, Ser: 0.8 mg/dL (ref 0.4–1.2)
GFR: 83.07 mL/min (ref >60.00)
Glucose, Bld: 103 mg/dL — ABNORMAL HIGH (ref 70–99)
Potassium: 3.6 mEq/L (ref 3.5–5.1)
Sodium: 141 mEq/L (ref 135–145)

## 2011-09-14 LAB — HEPATIC FUNCTION PANEL
ALT: 16 U/L (ref 0–35)
AST: 25 U/L (ref 0–37)
Albumin: 4.3 g/dL (ref 3.5–5.2)
Alkaline Phosphatase: 41 U/L (ref 39–117)
Bilirubin, Direct: 0 mg/dL (ref 0.0–0.3)
Total Bilirubin: 0.3 mg/dL (ref 0.3–1.2)
Total Protein: 7.7 g/dL (ref 6.0–8.3)

## 2011-09-14 LAB — POCT URINALYSIS DIPSTICK
Bilirubin, UA: NEGATIVE
Glucose, UA: NEGATIVE
Ketones, UA: NEGATIVE
Leukocytes, UA: NEGATIVE
Nitrite, UA: NEGATIVE
Protein, UA: NEGATIVE
Spec Grav, UA: 1.02
Urobilinogen, UA: 0.2
pH, UA: 7

## 2011-09-14 LAB — TSH: TSH: 1.54 u[IU]/mL (ref 0.35–5.50)

## 2011-09-14 LAB — LIPID PANEL
Cholesterol: 238 mg/dL — ABNORMAL HIGH (ref 0–200)
HDL: 45 mg/dL (ref >39.00)
Total CHOL/HDL Ratio: 5
Triglycerides: 168 mg/dL — ABNORMAL HIGH (ref 0.0–149.0)
VLDL: 33.6 mg/dL (ref 0.0–40.0)

## 2011-09-14 LAB — LDL CHOLESTEROL, DIRECT: Direct LDL: 161 mg/dL

## 2011-09-14 MED ORDER — SUMATRIPTAN SUCCINATE 50 MG PO TABS: 50.0000 mg | ORAL_TABLET | ORAL | Status: DC | PRN

## 2011-09-14 MED ORDER — HYDROCODONE-ACETAMINOPHEN 7.5-750 MG PO TABS: ORAL_TABLET | ORAL | Status: DC

## 2011-09-14 MED ORDER — CLOBETASOL PROPIONATE 0.05 % EX FOAM: Freq: Two times a day (BID) | CUTANEOUS | Status: DC

## 2011-09-14 MED ORDER — CYCLOBENZAPRINE HCL 10 MG PO TABS: 10.0000 mg | ORAL_TABLET | ORAL | Status: DC

## 2011-09-14 MED ORDER — ESOMEPRAZOLE MAGNESIUM 40 MG PO CPDR: 40.0000 mg | DELAYED_RELEASE_CAPSULE | Freq: Every day | ORAL | Status: DC

## 2011-09-14 MED ORDER — METHOTREXATE 2.5 MG PO TABS: 2.5000 mg | ORAL_TABLET | ORAL | Status: DC

## 2011-09-14 MED ORDER — PROMETHAZINE HCL 25 MG PO TABS: 25.0000 mg | ORAL_TABLET | Freq: Four times a day (QID) | ORAL | Status: DC | PRN

## 2011-09-14 MED ORDER — TRAMADOL HCL 50 MG PO TABS: 50.0000 mg | ORAL_TABLET | Freq: Two times a day (BID) | ORAL | Status: DC

## 2011-09-14 MED ORDER — LEVOTHYROXINE SODIUM 75 MCG PO TABS: 75.0000 ug | ORAL_TABLET | Freq: Every day | ORAL | Status: DC

## 2011-09-14 MED ORDER — MOMETASONE FUROATE 50 MCG/ACT NA SUSP: 2.0000 | Freq: Every day | NASAL | Status: DC

## 2011-09-14 MED ORDER — LISINOPRIL-HYDROCHLOROTHIAZIDE 20-12.5 MG PO TABS: 1.0000 | ORAL_TABLET | Freq: Every day | ORAL | Status: DC

## 2011-09-14 MED ORDER — TOPIRAMATE 100 MG PO TABS: 100.0000 mg | ORAL_TABLET | Freq: Every day | ORAL | Status: DC

## 2011-09-14 NOTE — Patient Instructions (Signed)
Continue your current medications  Remember to walk 30 minutes daily followup in 3 months sooner if any problems  Return in 2 weeks for a 30 minute appointment to remove the moles we discussed

## 2011-09-14 NOTE — Progress Notes (Signed)
  Subjective:    Patient ID: Cheryl Knight, female    DOB: [birth year], 56 y.o.   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 She also uses Flexeril 10 mg each bedtime and tramadol for chronic back pain.  She has migraine headaches for which she takes Topamax 100 mg daily and an occasional pain pill  She has a history of hypertension on Zestoretic one tablet daily BP 120/70  She has a history of hypothyroidism on Synthroid 75 mcg daily check TSH level today. She also has allergic rhinitis for to use a steroid nasal spray. She also has chronic reflux esophagitis she takes Nexium 40 mg daily.  She also uses a steroid foam for psoriasis of her scalp folic acid and vitamins. She gets routine eye care, dental care, BSE monthly, and you mammography, colonoscopy within 10 years normal, tetanus 2006.   Review of Systems  Constitutional: Negative.   HENT: Negative.   Eyes: Negative.   Respiratory: Negative.   Cardiovascular: Negative.   Gastrointestinal: Negative.   Genitourinary: Negative.   Musculoskeletal: Negative.   Neurological: Negative.   Hematological: Negative.   Psychiatric/Behavioral: Negative.        Objective:   Physical Exam  Constitutional: She appears well-developed and well-nourished.  HENT:  Head: Normocephalic and atraumatic.  Right Ear: External ear normal.  Left Ear: External ear normal.  Nose: Nose normal.  Mouth/Throat: Oropharynx is clear and moist.  Eyes: Conjunctivae and EOM are normal. Pupils are equal, round, and reactive to light. Right eye exhibits no discharge. Left eye exhibits no discharge. No scleral icterus.  Neck: Normal range of motion. Neck supple. No JVD present. No tracheal deviation present. No thyromegaly present.  Cardiovascular: Normal  rate, regular rhythm, normal heart sounds and intact distal pulses.  Exam reveals no gallop and no friction rub.   No murmur heard. Pulmonary/Chest: Effort normal and breath sounds normal. No stridor. No respiratory distress. She has no wheezes. She has no rales. She exhibits no tenderness.  Abdominal: Soft. Bowel sounds are normal. She exhibits no distension and no mass. There is no tenderness. There is no rebound.  Genitourinary: Vagina normal. Guaiac negative stool. No vaginal discharge found.  Musculoskeletal: Normal range of motion. She exhibits no edema and no tenderness.  Lymphadenopathy:    She has no cervical adenopathy.  Neurological: She is alert. She has normal reflexes. No cranial nerve deficit. She exhibits normal muscle tone. Coordination normal.  Skin: Skin is warm and dry.       Total body skin exam normal except for a red lesion right upper anterior chest wall and a red lesion left clavicle nonhealing return for removal  Psychiatric: She has a normal mood and affect. Her behavior is normal. Judgment and thought content normal.          Assessment & Plan:  Healthy female  Rheumatoid arthritis continue current therapy  Chronic back pain continue current therapy  Hypertension continue current therapy  Allergic rhinitis continue current therapy  Reflux esophagitis continue current therapy migraine headaches continue current therapy  Obesity recommended diet exercise and weight loss

## 2011-09-28 ENCOUNTER — Ambulatory Visit: Payer: 59 | Admitting: Family Medicine

## 2011-10-05 ENCOUNTER — Ambulatory Visit (INDEPENDENT_AMBULATORY_CARE_PROVIDER_SITE_OTHER): Payer: 59 | Admitting: Family Medicine

## 2011-10-05 ENCOUNTER — Encounter: Payer: Self-pay | Admitting: Family Medicine

## 2011-10-05 DIAGNOSIS — C44519 Basal cell carcinoma of skin of other part of trunk: Secondary | ICD-10-CM | POA: Insufficient documentation

## 2011-10-05 DIAGNOSIS — L989 Disorder of the skin and subcutaneous tissue, unspecified: Secondary | ICD-10-CM

## 2011-10-05 NOTE — Progress Notes (Signed)
  Subjective:    Patient ID: Cheryl Knight, female    DOB: 16-Apr-1955, 56 y.o.   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  The lesion measures 8 mm x8 mm  After informed consent the lesion was anesthetized with 1% Xylocaine with epinephrine and remove the 3 mm margins. The base was cauterized Band-Aids applied the lesion was sent for pathologic analysis. She tolerated the procedure no complications  In addition we removed a number of skin tags there were 8 from her left axillary area, 4 from the right axillary area, 3 left inner thigh and one right inner thigh    Review of Systems    general and dermatologic review of systems otherwise negative Objective:   Physical Exam Procedure see above       Assessment & Plan:  Clinically this appears to be a basal cell carcinoma Path pending

## 2011-10-26 ENCOUNTER — Other Ambulatory Visit: Payer: Self-pay | Admitting: Dermatology

## 2012-04-19 ENCOUNTER — Other Ambulatory Visit: Payer: Self-pay | Admitting: Family Medicine

## 2012-04-20 ENCOUNTER — Other Ambulatory Visit: Payer: Self-pay | Admitting: *Deleted

## 2012-04-20 DIAGNOSIS — G43909 Migraine, unspecified, not intractable, without status migrainosus: Secondary | ICD-10-CM

## 2012-04-20 DIAGNOSIS — E039 Hypothyroidism, unspecified: Secondary | ICD-10-CM

## 2012-04-20 DIAGNOSIS — L408 Other psoriasis: Secondary | ICD-10-CM

## 2012-04-20 DIAGNOSIS — I10 Essential (primary) hypertension: Secondary | ICD-10-CM

## 2012-04-20 DIAGNOSIS — Z0000 Encounter for general adult medical examination without abnormal findings: Secondary | ICD-10-CM

## 2012-04-20 DIAGNOSIS — K219 Gastro-esophageal reflux disease without esophagitis: Secondary | ICD-10-CM

## 2012-04-20 DIAGNOSIS — E785 Hyperlipidemia, unspecified: Secondary | ICD-10-CM

## 2012-04-20 DIAGNOSIS — M069 Rheumatoid arthritis, unspecified: Secondary | ICD-10-CM

## 2012-04-20 DIAGNOSIS — M3500 Sicca syndrome, unspecified: Secondary | ICD-10-CM

## 2012-04-20 MED ORDER — HYDROCODONE-ACETAMINOPHEN 7.5-325 MG PO TABS: ORAL_TABLET | ORAL | Status: DC

## 2012-06-05 ENCOUNTER — Encounter: Payer: Self-pay | Admitting: Family Medicine

## 2012-06-13 ENCOUNTER — Encounter: Payer: Self-pay | Admitting: Family Medicine

## 2012-06-13 ENCOUNTER — Ambulatory Visit (INDEPENDENT_AMBULATORY_CARE_PROVIDER_SITE_OTHER): Payer: 59 | Admitting: Family Medicine

## 2012-06-13 VITALS — BP 118/76 | Temp 98.0°F | Wt 212.0 lb

## 2012-06-13 DIAGNOSIS — N390 Urinary tract infection, site not specified: Secondary | ICD-10-CM

## 2012-06-13 DIAGNOSIS — R300 Dysuria: Secondary | ICD-10-CM

## 2012-06-13 LAB — POCT URINALYSIS DIPSTICK
Nitrite, UA: NEGATIVE
Protein, UA: NEGATIVE
Spec Grav, UA: 1.01
Urobilinogen, UA: 0.2
pH, UA: 7

## 2012-06-13 MED ORDER — NITROFURANTOIN MONOHYD MACRO 100 MG PO CAPS: 100.0000 mg | ORAL_CAPSULE | Freq: Two times a day (BID) | ORAL | Status: DC

## 2012-06-13 NOTE — Progress Notes (Signed)
Chief Complaint  Patient presents with  . Dysuria    HPI:  ? UTI: -had [place] 4 days ago [place] -started having symptoms of UTI yesterday -symptoms: burning with urination, urgency, frequency -denies: fevers, NVD, abd or flank pain, gross hematuria, inability to drink fluids, vaginal discharge -hx of UTI in the past -not allergic to any abx  ROS: See pertinent positives and negatives per HPI.  No past medical history on file.  No family history on file.  History   Social History  . Marital Status: Married    Spouse Name: N/A    Number of Children: N/A  . Years of Education: N/A   Social History Main Topics  . Smoking status: Passive Smoke Exposure - Never Smoker  . Smokeless tobacco: None  . Alcohol Use:   . Drug Use:   . Sexually Active:    Other Topics Concern  . None   Social History Narrative  . None    Current outpatient prescriptions:Calcium Carbonate-Vitamin D (CALCIUM PLUS VITAMIN D PO), Take by mouth., Disp: , Rfl: ;  clobetasol (OLUX) 0.05 % topical foam, Apply topically 2 (two) times daily., Disp: 100 g, Rfl: 6;  cyclobenzaprine (FLEXERIL) 10 MG tablet, Take 1 tablet (10 mg total) by mouth 1 day or 1 dose., Disp: 100 tablet, Rfl: 3;  diclofenac sodium (VOLTAREN) 1 % GEL, Apply topically., Disp: , Rfl:  esomeprazole (NEXIUM) 40 MG capsule, Take 1 capsule (40 mg total) by mouth daily before breakfast., Disp: 100 capsule, Rfl: 3;  fish oil-omega-3 fatty acids 1000 MG capsule, Take 1 g by mouth daily., Disp: , Rfl: ;  folic acid (FOLVITE) 1 MG tablet, , Disp: , Rfl: ;  glucosamine-chondroitin 500-400 MG tablet, Take 1 tablet by mouth 3 (three) times daily., Disp: , Rfl:  HYDROcodone-acetaminophen (NORCO) 7.5-325 MG per tablet, Half to one tab as needed for migraine, Disp: 30 tablet, Rfl: 0;  levothyroxine (SYNTHROID, LEVOTHROID) 75 MCG tablet, Take 1 tablet (75 mcg total) by mouth daily., Disp: 100 tablet, Rfl: 3;   lisinopril-hydrochlorothiazide (PRINZIDE,ZESTORETIC) 20-12.5 MG per tablet, Take 1 tablet by mouth daily., Disp: 100 tablet, Rfl: 3 methotrexate (RHEUMATREX) 2.5 MG tablet, Take 1 tablet (2.5 mg total) by mouth once a week. 8 tabs weekly, Disp: 100 tablet, Rfl: 2;  mometasone (NASONEX) 50 MCG/ACT nasal spray, Place 2 sprays into the nose daily., Disp: 17 g, Rfl: 11;  promethazine (PHENERGAN) 25 MG tablet, Take 1 tablet (25 mg total) by mouth every 6 (six) hours as needed., Disp: 30 tablet, Rfl: 2 SUMAtriptan (IMITREX) 50 MG tablet, Take 1 tablet (50 mg total) by mouth every 2 (two) hours as needed for migraine., Disp: 6 tablet, Rfl: 6;  topiramate (TOPAMAX) 100 MG tablet, Take 1 tablet (100 mg total) by mouth daily., Disp: 100 tablet, Rfl: 3;  traMADol (ULTRAM) 50 MG tablet, Take 1 tablet (50 mg total) by mouth 2 (two) times daily. 2 tabs twice daily, Disp: 30 tablet, Rfl: 3 nitrofurantoin, macrocrystal-monohydrate, (MACROBID) 100 MG capsule, Take 1 capsule (100 mg total) by mouth 2 (two) times daily., Disp: 14 capsule, Rfl: 0  EXAM:  Filed Vitals:   06/13/12 1052  BP: 118/76  Temp: 98 F (36.7 C)    Body mass index is 33.2 kg/(m^2).  GENERAL: vitals reviewed and listed above, alert, oriented, appears well hydrated and in no acute distress  HEENT: atraumatic, conjunttiva clear, no obvious abnormalities on inspection of external nose and ears  NECK: no  obvious masses on inspection  LUNGS: clear to auscultation bilaterally, no wheezes, rales or rhonchi, good air movement  CV: HRRR, no peripheral edema  ABD: BS+, soft, NTTP, no CVA TTP  MS: moves all extremities without noticeable abnormality  PSYCH: pleasant and cooperative, no obvious depression or anxiety  ASSESSMENT AND PLAN:  Discussed the following assessment and plan:  Dysuria - Plan: POCT urinalysis dipstick, Culture, Urine, POCT urinalysis dipstick  UTI (urinary tract infection) - Plan: nitrofurantoin,  macrocrystal-monohydrate, (MACROBID) 100 MG capsule  -symptoms of UTI, urine dip abnormal - on Azo, discussed empiric tx and she would like to do nitrofurantoin as has used this in the past. Risks discussed. Culture pending. Return precautions. Advised plenty of fluids, and prevention tips. -Patient advised to return or notify a doctor immediately if symptoms worsen or persist or new concerns arise.  There are no Patient Instructions on file for this visit.   [name] R.

## 2012-06-15 ENCOUNTER — Other Ambulatory Visit: Payer: Self-pay | Admitting: Family Medicine

## 2012-06-15 DIAGNOSIS — N390 Urinary tract infection, site not specified: Secondary | ICD-10-CM

## 2012-06-15 LAB — URINE CULTURE: Colony Count: >=100000

## 2012-06-15 MED ORDER — CIPROFLOXACIN HCL 500 MG PO TABS: 500.0000 mg | ORAL_TABLET | Freq: Two times a day (BID) | ORAL | Status: DC

## 2012-06-15 NOTE — Progress Notes (Signed)
Quick Note:  Left a message for return call. ______ 

## 2012-06-16 NOTE — Progress Notes (Signed)
Quick Note:  Called and spoke with pt and pt is aware. ______ 

## 2012-06-16 NOTE — Progress Notes (Signed)
Quick Note:  Left a message for return call. ______ 

## 2012-06-27 ENCOUNTER — Telehealth: Payer: Self-pay | Admitting: Family Medicine

## 2012-06-27 MED ORDER — CIPROFLOXACIN HCL 250 MG PO TABS: 250.0000 mg | ORAL_TABLET | Freq: Two times a day (BID) | ORAL | Status: DC

## 2012-06-27 MED ORDER — FLUCONAZOLE 150 MG PO TABS: 150.0000 mg | ORAL_TABLET | Freq: Once | ORAL | Status: DC

## 2012-06-27 NOTE — Telephone Encounter (Signed)
Patient Information:  Caller Name: Cheryl Knight  Phone: [phone]  Patient: Cheryl Knight, Cheryl Knight  Gender: Female  DOB: [birth year]  Age: 57 Years  PCP: Kelle Darting [facility])  Office Follow Up:  Does the office need to follow up with this patient?: Yes  Instructions For The Office: Call back needed; see note.  RN Note:  Completed 5 days of Cipro for UTI. Resumed former Rx for Macrobid 06/24/12 to 06/27/12 for recurrent UTI symptoms. Completed last dose of Macrobid this morning.  Dysuria resolved but continues to have occasional urgency. Since finished antibiotic today (06/27/12), message sent to MD per RN judgement for call back today regarding follow up recommendation.  Symptoms  Reason For Call & Symptoms: Recurrent urinary ugency and dysuria returned within 3 days of finishing Cipro. Diagnosed with UTI 06/15/12 and started on Mabrobid 06/16/12.  Then received call 3 days later advising to change to Cipro BID for 5 days. Resumed left over Macrobid BID 06/24/12 due to recurrent symptoms.  Reviewed Health History In EMR: Yes  Reviewed Medications In EMR: Yes  Reviewed Allergies In EMR: Yes  Reviewed Surgeries / Procedures: Yes  Date of Onset of Symptoms: 06/24/2012  Treatments Tried: Resumed old Rx for Macrobid, > water intake.  Treatments Tried Worked: Yes  Guideline(s) Used:  Urination Pain - Female  Disposition Per Guideline:   See Today in Office  Reason For Disposition Reached:   Age > 50 years  Advice Given:  N/A  RN Overrode Recommendation:  Follow Up With Office Later Because it is too early to reculture the urine since antibiotic is still present, please discuss recommended follow up with MD and call back today on work cell [phone].

## 2012-06-27 NOTE — Telephone Encounter (Signed)
Per Dr Tawanna Cooler , stop the Macrobid and restart Cipro.  Patient is aware.

## 2012-09-12 ENCOUNTER — Other Ambulatory Visit: Payer: 59

## 2012-09-19 ENCOUNTER — Encounter: Payer: 59 | Admitting: Family Medicine

## 2012-09-20 ENCOUNTER — Other Ambulatory Visit: Payer: Self-pay | Admitting: Family Medicine

## 2012-09-22 ENCOUNTER — Other Ambulatory Visit: Payer: Self-pay | Admitting: Family Medicine

## 2012-10-20 ENCOUNTER — Other Ambulatory Visit: Payer: Self-pay | Admitting: Family Medicine

## 2012-11-13 ENCOUNTER — Other Ambulatory Visit: Payer: Self-pay | Admitting: Family Medicine

## 2012-11-20 ENCOUNTER — Other Ambulatory Visit (INDEPENDENT_AMBULATORY_CARE_PROVIDER_SITE_OTHER): Payer: 59

## 2012-11-20 DIAGNOSIS — Z0000 Encounter for general adult medical examination without abnormal findings: Secondary | ICD-10-CM

## 2012-11-20 LAB — CBC WITH DIFFERENTIAL/PLATELET
Basophils Absolute: 0 10*3/uL (ref 0.0–0.1)
Basophils Relative: 0.5 % (ref 0.0–3.0)
Eosinophils Absolute: 0.2 10*3/uL (ref 0.0–0.7)
Eosinophils Relative: 3 % (ref 0.0–5.0)
HCT: 41.6 % (ref 36.0–46.0)
Hemoglobin: 14.1 g/dL (ref 12.0–15.0)
Lymphocytes Relative: 36.6 % (ref 12.0–46.0)
Lymphs Abs: 2.3 10*3/uL (ref 0.7–4.0)
MCHC: 33.8 g/dL (ref 30.0–36.0)
MCV: 93.6 fl (ref 78.0–100.0)
Monocytes Absolute: 0.6 10*3/uL (ref 0.1–1.0)
Monocytes Relative: 9.5 % (ref 3.0–12.0)
Neutro Abs: 3.1 10*3/uL (ref 1.4–7.7)
Neutrophils Relative %: 50.4 % (ref 43.0–77.0)
Platelets: 255 10*3/uL (ref 150.0–400.0)
RBC: 4.45 Mil/uL (ref 3.87–5.11)
RDW: 13.8 % (ref 11.5–14.6)
WBC: 6.2 10*3/uL (ref 4.5–10.5)

## 2012-11-20 LAB — HEPATIC FUNCTION PANEL
ALT: 13 U/L (ref 0–35)
AST: 18 U/L (ref 0–37)
Albumin: 4.1 g/dL (ref 3.5–5.2)
Alkaline Phosphatase: 44 U/L (ref 39–117)
Bilirubin, Direct: 0.1 mg/dL (ref 0.0–0.3)
Total Bilirubin: 0.7 mg/dL (ref 0.3–1.2)
Total Protein: 7.3 g/dL (ref 6.0–8.3)

## 2012-11-20 LAB — POCT URINALYSIS DIPSTICK
Bilirubin, UA: NEGATIVE
Glucose, UA: NEGATIVE
Ketones, UA: NEGATIVE
Leukocytes, UA: NEGATIVE
Nitrite, UA: NEGATIVE
Protein, UA: NEGATIVE
Spec Grav, UA: 1.02
Urobilinogen, UA: 0.2
pH, UA: 7

## 2012-11-20 LAB — LIPID PANEL
Cholesterol: 214 mg/dL — ABNORMAL HIGH (ref 0–200)
HDL: 39.4 mg/dL (ref >39.00)
Total CHOL/HDL Ratio: 5
Triglycerides: 201 mg/dL — ABNORMAL HIGH (ref 0.0–149.0)
VLDL: 40.2 mg/dL — ABNORMAL HIGH (ref 0.0–40.0)

## 2012-11-20 LAB — BASIC METABOLIC PANEL
BUN: 15 mg/dL (ref 6–23)
CO2: 28 mEq/L (ref 19–32)
Calcium: 10.1 mg/dL (ref 8.4–10.5)
Chloride: 106 mEq/L (ref 96–112)
Creatinine, Ser: 0.9 mg/dL (ref 0.4–1.2)
GFR: 71.73 mL/min (ref >60.00)
Glucose, Bld: 103 mg/dL — ABNORMAL HIGH (ref 70–99)
Potassium: 3.7 mEq/L (ref 3.5–5.1)
Sodium: 141 mEq/L (ref 135–145)

## 2012-11-20 LAB — LDL CHOLESTEROL, DIRECT: Direct LDL: 147.9 mg/dL

## 2012-11-20 LAB — TSH: TSH: 5.37 u[IU]/mL (ref 0.35–5.50)

## 2012-11-26 ENCOUNTER — Other Ambulatory Visit: Payer: Self-pay | Admitting: Family Medicine

## 2012-11-28 ENCOUNTER — Ambulatory Visit (INDEPENDENT_AMBULATORY_CARE_PROVIDER_SITE_OTHER): Payer: 59 | Admitting: Family Medicine

## 2012-11-28 ENCOUNTER — Encounter: Payer: Self-pay | Admitting: Family Medicine

## 2012-11-28 VITALS — BP 120/80 | Temp 98.2°F | Ht 68.25 in | Wt 214.0 lb

## 2012-11-28 DIAGNOSIS — Z0000 Encounter for general adult medical examination without abnormal findings: Secondary | ICD-10-CM

## 2012-11-28 DIAGNOSIS — M069 Rheumatoid arthritis, unspecified: Secondary | ICD-10-CM

## 2012-11-28 DIAGNOSIS — I10 Essential (primary) hypertension: Secondary | ICD-10-CM

## 2012-11-28 DIAGNOSIS — G43909 Migraine, unspecified, not intractable, without status migrainosus: Secondary | ICD-10-CM

## 2012-11-28 DIAGNOSIS — K219 Gastro-esophageal reflux disease without esophagitis: Secondary | ICD-10-CM

## 2012-11-28 DIAGNOSIS — N952 Postmenopausal atrophic vaginitis: Secondary | ICD-10-CM

## 2012-11-28 DIAGNOSIS — M3500 Sicca syndrome, unspecified: Secondary | ICD-10-CM

## 2012-11-28 DIAGNOSIS — E785 Hyperlipidemia, unspecified: Secondary | ICD-10-CM

## 2012-11-28 DIAGNOSIS — E039 Hypothyroidism, unspecified: Secondary | ICD-10-CM

## 2012-11-28 DIAGNOSIS — L408 Other psoriasis: Secondary | ICD-10-CM

## 2012-11-28 MED ORDER — PROMETHAZINE HCL 25 MG PO TABS: 25.0000 mg | ORAL_TABLET | Freq: Four times a day (QID) | ORAL | Status: DC | PRN

## 2012-11-28 MED ORDER — ESTROGENS, CONJUGATED 0.625 MG/GM VA CREA: 1.0000 | TOPICAL_CREAM | Freq: Every day | VAGINAL | Status: DC

## 2012-11-28 MED ORDER — HYDROCODONE-ACETAMINOPHEN 7.5-325 MG PO TABS: ORAL_TABLET | ORAL | Status: DC

## 2012-11-28 MED ORDER — LEVOTHYROXINE SODIUM 75 MCG PO TABS: 75.0000 ug | ORAL_TABLET | Freq: Every day | ORAL | Status: DC

## 2012-11-28 MED ORDER — CLOBETASOL PROPIONATE 0.05 % EX FOAM: Freq: Two times a day (BID) | CUTANEOUS | Status: DC

## 2012-11-28 MED ORDER — LISINOPRIL-HYDROCHLOROTHIAZIDE 20-12.5 MG PO TABS: ORAL_TABLET | ORAL | Status: DC

## 2012-11-28 MED ORDER — CYCLOBENZAPRINE HCL 10 MG PO TABS: ORAL_TABLET | ORAL | Status: DC

## 2012-11-28 MED ORDER — TOPIRAMATE 100 MG PO TABS: ORAL_TABLET | ORAL | Status: DC

## 2012-11-28 MED ORDER — ESOMEPRAZOLE MAGNESIUM 40 MG PO CPDR: DELAYED_RELEASE_CAPSULE | ORAL | Status: DC

## 2012-11-28 MED ORDER — SUMATRIPTAN SUCCINATE 50 MG PO TABS: ORAL_TABLET | ORAL | Status: DC

## 2012-11-28 MED ORDER — MOMETASONE FUROATE 50 MCG/ACT NA SUSP: 2.0000 | Freq: Every day | NASAL | Status: DC

## 2012-11-28 NOTE — Patient Instructions (Addendum)
Continue your current medications  Return in one year sooner if any problems  Discontinue the tanning booth!!!!!!!!!!  Premarin vaginal cream small amounts 3 times weekly for about 2-3 months then decrease to twice weekly

## 2012-11-28 NOTE — Progress Notes (Signed)
  Subjective:    Patient ID: Cheryl Knight, female    DOB: 1955/12/21, 57 y.o.   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  She sees her rheumatologist on regular basis  She gets routine eye care, dental care, BSE monthly, and you mammography, colonoscopy within 10 years normal.  Medicines reviewed there've been no changes except they added pilocarpine because of the dryness in her mouth.  Declines a flu shot   Review of Systems  Constitutional: Negative.   HENT: Negative.   Eyes: Negative.   Respiratory: Negative.   Cardiovascular: Negative.   Gastrointestinal: Negative.   Endocrine: Negative.   Genitourinary: Negative.   Musculoskeletal: Negative.   Allergic/Immunologic: Negative.   Neurological: Negative.   Psychiatric/Behavioral: Negative.        Objective:   Physical Exam  Constitutional: She appears well-developed and well-nourished.  HENT:  Head: Normocephalic and atraumatic.  Right Ear: External ear normal.  Left Ear: External ear normal.  Nose: Nose normal.  Mouth/Throat: Oropharynx is clear and moist.  Eyes: EOM are normal. Pupils are equal, round, and reactive to light.  Neck: Normal range of motion. Neck supple. No thyromegaly present.  Cardiovascular: Normal rate, regular rhythm, normal heart sounds and intact distal pulses.  Exam reveals no gallop and no friction rub.   No murmur heard. Pulmonary/Chest: Effort normal and breath sounds normal.  Abdominal: Soft. Bowel sounds are normal. She exhibits no distension and no mass. There is no tenderness. There is no rebound.  Genitourinary: Vagina normal and uterus normal.  Guaiac negative stool. No vaginal discharge found.  Bilateral breast exam normal  Vagina extremely dry  Musculoskeletal: Normal range of motion.  Lymphadenopathy:    She has no cervical adenopathy.  Neurological: She is alert. She has normal reflexes. No cranial nerve deficit. She exhibits normal muscle tone. Coordination normal.  Skin: Skin is warm and dry.  Total body skin exam normal. She has a suntan. Admits to still going to the tanning both,,,,,,,, advised to DC the tanning booth because she's had a basal cell skin cancer on her chest  Psychiatric: She has a normal mood and affect. Her behavior is normal. Judgment and thought content normal.          Assessment & Plan:  Rheumatoid arthritis followed by rheumatology  Psoriasis continue cream  Hypothyroidism continue Synthroid  Hypertension continue Zestoretic  Back pain continue Flexeril each bedtime when necessary  Migraine headaches continue Topamax Phenergan Vicodin and sumatriptan  Allergic rhinitis continue steroid nasal spray  Postmenopausal vaginal dryness the Premarin cream

## 2012-11-29 ENCOUNTER — Telehealth: Payer: Self-pay | Admitting: Family Medicine

## 2012-11-29 NOTE — Telephone Encounter (Signed)
Left message on machine for patient to see if Rx for fluticasone is okay

## 2012-11-29 NOTE — Telephone Encounter (Signed)
I recv a fax back from [employer] [facility] stating Nasonex is excluded under pt's plan.  Flonase or the generic Fluticasone 50 mcg is covered.  If you would to advise of any other nasal sprays for the pt, please let me know and I will call [employer] back to see if it is covered.  Please advise.

## 2012-12-06 NOTE — Telephone Encounter (Signed)
Left message with husband and he will have wife call back

## 2012-12-13 MED ORDER — FLUTICASONE PROPIONATE 50 MCG/ACT NA SUSP: 1.0000 | Freq: Every day | NASAL | Status: DC

## 2012-12-13 NOTE — Telephone Encounter (Signed)
Patient left message okay to switch to flonase

## 2013-01-23 ENCOUNTER — Telehealth: Payer: Self-pay | Admitting: Family Medicine

## 2013-01-23 NOTE — Telephone Encounter (Signed)
Pt will be faxing letter she received from insurance company regarding nexium rx.  Pt thinks she may need a letter written from Dr. Sherren Mocha.  Advised pt to fax letter and we will review and determine what she needs.

## 2013-02-01 ENCOUNTER — Telehealth: Payer: Self-pay | Admitting: Family Medicine

## 2013-02-01 NOTE — Telephone Encounter (Signed)
I received a denial fax from [employer] stating Nexium is excluded under the pt's plan and cannot be approved.  Omeprazole and Pantoprazole is covered.

## 2013-02-02 NOTE — Telephone Encounter (Signed)
Left message on machine for patient to return our call 

## 2013-02-05 NOTE — Telephone Encounter (Signed)
Patient has had her esophagus stretched and would like a prior auth. Done.

## 2013-02-06 NOTE — Telephone Encounter (Signed)
New prior auth submitted and samples ready for pick up

## 2013-02-07 ENCOUNTER — Telehealth: Payer: Self-pay | Admitting: Family Medicine

## 2013-02-07 NOTE — Telephone Encounter (Signed)
Attempted to return call to patient x 2 regarding Rx question/ problem; left voice mail with office phone number for callback.

## 2013-02-08 ENCOUNTER — Ambulatory Visit (INDEPENDENT_AMBULATORY_CARE_PROVIDER_SITE_OTHER): Payer: 59 | Admitting: Family Medicine

## 2013-02-08 ENCOUNTER — Encounter: Payer: Self-pay | Admitting: Family Medicine

## 2013-02-08 VITALS — BP 120/76 | HR 114 | Temp 99.0°F | Wt 212.0 lb

## 2013-02-08 DIAGNOSIS — J189 Pneumonia, unspecified organism: Secondary | ICD-10-CM

## 2013-02-08 MED ORDER — AZITHROMYCIN 250 MG PO TABS: ORAL_TABLET | ORAL | Status: DC

## 2013-02-08 MED ORDER — HYDROCOD POLST-CHLORPHEN POLST 10-8 MG/5ML PO LQCR: 5.0000 mL | Freq: Every evening | ORAL | Status: DC | PRN

## 2013-02-08 NOTE — Progress Notes (Signed)
Pre visit review using our clinic review tool, if applicable. No additional management support is needed unless otherwise documented below in the visit note. 

## 2013-02-08 NOTE — Patient Instructions (Signed)
INSTRUCTIONS FOR UPPER RESPIRATORY INFECTION:  -plenty of rest and fluids  -As we discussed, we have prescribed a new medication for you at this appointment. We discussed the common and serious potential adverse effects of this medication and you can review these and more with the pharmacist when you pick up your medication.  Please follow the instructions for use carefully and notify us immediately if you have any problems taking this medication.  -nasal saline wash 2-3 times daily (use prepackaged nasal saline or bottled/distilled water if making your own)   -can use sinex nasal spray for drainage and nasal congestion - but do NOT use longer then 3-4 days  -can use tylenol or ibuprofen as directed for aches and sorethroat  -in the winter time, using a humidifier at night is helpful (please follow cleaning instructions)  -if you are taking a cough medication - use only as directed, may also try a teaspoon of honey to coat the throat and throat lozenges  -for sore throat, salt water gargles can help  -follow up if you have fevers, facial pain, tooth pain, difficulty breathing or are worsening or not getting better in 5-7 days

## 2013-02-08 NOTE — Progress Notes (Signed)
Chief Complaint  Patient presents with  . Cough    congrestion x 1 week     HPI:  Cough: -started:1 week ago  -symptoms:nasal congestion, sore throat, cough - cough is the worse and feels tired -denies:fever, SOB, NVD, tooth pain -has tried:  nothing -sick contacts/travel/risks: denies flu exposure or Ebola risks  ROS: See pertinent positives and negatives per HPI.  No past medical history on file.  No past surgical history on file.  No family history on file.  History   Social History  . Marital Status: Married    Spouse Name: N/A    Number of Children: N/A  . Years of Education: N/A   Social History Main Topics  . Smoking status: Passive Smoke Exposure - Never Smoker  . Smokeless tobacco: None  . Alcohol Use:   . Drug Use:   . Sexual Activity:    Other Topics Concern  . None   Social History Narrative  . None    Current outpatient prescriptions:Calcium Carbonate-Vitamin D (CALCIUM PLUS VITAMIN D PO), Take by mouth., Disp: , Rfl: ;  clobetasol (OLUX) 0.05 % topical foam, Apply topically 2 (two) times daily., Disp: 100 g, Rfl: 6;  conjugated estrogens (PREMARIN) vaginal cream, Place 1 Applicatorful vaginally daily., Disp: 42.5 g, Rfl: 12;  cyclobenzaprine (FLEXERIL) 10 MG tablet, TAKE ONE TABLET DAILY, Disp: 100 tablet, Rfl: 3 diclofenac sodium (VOLTAREN) 1 % GEL, Apply topically., Disp: , Rfl: ;  esomeprazole (NEXIUM) 40 MG capsule, TAKE ONE CAPSULE DAILY BEFORE BREAKFAST, Disp: 100 capsule, Rfl: 3;  fish oil-omega-3 fatty acids 1000 MG capsule, Take 1 g by mouth daily., Disp: , Rfl: ;  fluconazole (DIFLUCAN) 150 MG tablet, Take 1 tablet (150 mg total) by mouth once., Disp: 1 tablet, Rfl: 3 fluticasone (FLONASE) 50 MCG/ACT nasal spray, Place 1 spray into both nostrils daily., Disp: 16 g, Rfl: 3;  folic acid (FOLVITE) 1 MG tablet, , Disp: , Rfl: ;  glucosamine-chondroitin 500-400 MG tablet, Take 1 tablet by mouth 3 (three) times daily., Disp: , Rfl: ;   HYDROcodone-acetaminophen (NORCO) 7.5-325 MG per tablet, Half to one tab as needed for migraine, Disp: 30 tablet, Rfl: 0 levothyroxine (SYNTHROID, LEVOTHROID) 75 MCG tablet, Take 1 tablet (75 mcg total) by mouth daily., Disp: 100 tablet, Rfl: 3;  lisinopril-hydrochlorothiazide (PRINZIDE,ZESTORETIC) 20-12.5 MG per tablet, take 1 tablet by mouth once daily, Disp: 100 tablet, Rfl: 3;  methotrexate (RHEUMATREX) 2.5 MG tablet, Take 1 tablet (2.5 mg total) by mouth once a week. 8 tabs weekly, Disp: 100 tablet, Rfl: 2;  pilocarpine (SALAGEN) 5 MG tablet, , Disp: , Rfl:  promethazine (PHENERGAN) 25 MG tablet, Take 1 tablet (25 mg total) by mouth every 6 (six) hours as needed., Disp: 30 tablet, Rfl: 2;  SUMAtriptan (IMITREX) 50 MG tablet, TAKE ONE TABLET EVERY 2 HOURS AS NEEDED FOR MIGRAINE, Disp: 6 tablet, Rfl: 6;  topiramate (TOPAMAX) 100 MG tablet, take 1 tablet by mouth once daily, Disp: 100 tablet, Rfl: 3 traMADol (ULTRAM) 50 MG tablet, Take 1 tablet (50 mg total) by mouth 2 (two) times daily. 2 tabs twice daily, Disp: 30 tablet, Rfl: 3;  azithromycin (ZITHROMAX) 250 MG tablet, 2 tabs on first day and 1 tab daily for 4 more days, Disp: 6 tablet, Rfl: 0;  chlorpheniramine-HYDROcodone (TUSSIONEX PENNKINETIC ER) 10-8 MG/5ML LQCR, Take 5 mLs by mouth at bedtime as needed for cough., Disp: 115 mL, Rfl: 0  EXAM:  Filed Vitals:   02/08/13 1109  BP: 120/76  Pulse: 114  Temp: 99 F (37.2 C)    Body mass index is 31.98 kg/(m^2).  GENERAL: vitals reviewed and listed above, alert, oriented, appears well hydrated and in no acute distress  HEENT: atraumatic, conjunttiva clear, no obvious abnormalities on inspection of external nose and ears, normal appearance of ear canals and TMs, clear nasal congestion, mild post oropharyngeal erythema with PND, no tonsillar edema or exudate, no sinus TTP  NECK: no obvious masses on inspection  LUNGS: rhoncherous breath sounds L base  CV: HRRR, no peripheral edema  MS:  moves all extremities without noticeable abnormality  PSYCH: pleasant and cooperative, no obvious depression or anxiety  ASSESSMENT AND PLAN:  Discussed the following assessment and plan:  CAP (community acquired pneumonia) - Plan: azithromycin (ZITHROMAX) 250 MG tablet, chlorpheniramine-HYDROcodone (TUSSIONEX PENNKINETIC ER) 10-8 MG/5ML LQCR  -given HPI and exam findings today, a serious infection or illness is unlikely. We discussed potential etiologies, with viral infection possible but given lung exam and worsening symptoms with tx with abx after discussion risk in case of CAP. - We discussed treatment side effects, likely course, antibiotic misuse, transmission, and signs of developing a serious illness. -of course, we advised to return or notify a doctor immediately if symptoms worsen or persist or new concerns arise.    Patient Instructions  INSTRUCTIONS FOR UPPER RESPIRATORY INFECTION:  -plenty of rest and fluids  -As we discussed, we have prescribed a new medication for you at this appointment. We discussed the common and serious potential adverse effects of this medication and you can review these and more with the pharmacist when you pick up your medication.  Please follow the instructions for use carefully and notify us immediately if you have any problems taking this medication.  -nasal saline wash 2-3 times daily (use prepackaged nasal saline or bottled/distilled water if making your own)   -can use sinex nasal spray for drainage and nasal congestion - but do NOT use longer then 3-4 days  -can use tylenol or ibuprofen as directed for aches and sorethroat  -in the winter time, using a humidifier at night is helpful (please follow cleaning instructions)  -if you are taking a cough medication - use only as directed, may also try a teaspoon of honey to coat the throat and throat lozenges  -for sore throat, salt water gargles can help  -follow up if you have fevers, facial  pain, tooth pain, difficulty breathing or are worsening or not getting better in 5-7 days      Denice Cardon R.

## 2013-02-13 ENCOUNTER — Telehealth: Payer: Self-pay | Admitting: Family Medicine

## 2013-02-13 NOTE — Telephone Encounter (Signed)
I received a fax denying Nexium because it is excluded under the plan.  The fax advises the member to contact Member Services by calling the number on the back of their card.

## 2013-02-20 NOTE — Telephone Encounter (Signed)
Spoke with husband and he will inform patient and call back with her choice

## 2013-11-27 ENCOUNTER — Other Ambulatory Visit: Payer: 59

## 2013-11-28 ENCOUNTER — Other Ambulatory Visit (INDEPENDENT_AMBULATORY_CARE_PROVIDER_SITE_OTHER): Payer: 59

## 2013-11-28 DIAGNOSIS — Z0000 Encounter for general adult medical examination without abnormal findings: Secondary | ICD-10-CM

## 2013-11-28 LAB — POCT URINALYSIS DIPSTICK
Bilirubin, UA: NEGATIVE
Glucose, UA: NEGATIVE
Ketones, UA: NEGATIVE
Leukocytes, UA: NEGATIVE
Nitrite, UA: NEGATIVE
Protein, UA: NEGATIVE
Spec Grav, UA: 1.02
Urobilinogen, UA: 0.2
pH, UA: 6

## 2013-11-28 LAB — CBC WITH DIFFERENTIAL/PLATELET
Basophils Absolute: 0 10*3/uL (ref 0.0–0.1)
Basophils Relative: 0.6 % (ref 0.0–3.0)
Eosinophils Absolute: 0.2 10*3/uL (ref 0.0–0.7)
Eosinophils Relative: 3.4 % (ref 0.0–5.0)
HCT: 43.2 % (ref 36.0–46.0)
Hemoglobin: 14 g/dL (ref 12.0–15.0)
Lymphocytes Relative: 41.7 % (ref 12.0–46.0)
Lymphs Abs: 2.8 10*3/uL (ref 0.7–4.0)
MCHC: 32.5 g/dL (ref 30.0–36.0)
MCV: 96.7 fl (ref 78.0–100.0)
Monocytes Absolute: 0.6 10*3/uL (ref 0.1–1.0)
Monocytes Relative: 8.4 % (ref 3.0–12.0)
Neutro Abs: 3 10*3/uL (ref 1.4–7.7)
Neutrophils Relative %: 45.9 % (ref 43.0–77.0)
Platelets: 301 10*3/uL (ref 150.0–400.0)
RBC: 4.46 Mil/uL (ref 3.87–5.11)
RDW: 14.3 % (ref 11.5–15.5)
WBC: 6.6 10*3/uL (ref 4.0–10.5)

## 2013-11-28 LAB — HEPATIC FUNCTION PANEL
ALT: 11 U/L (ref 0–35)
AST: 18 U/L (ref 0–37)
Albumin: 3.5 g/dL (ref 3.5–5.2)
Alkaline Phosphatase: 40 U/L (ref 39–117)
Bilirubin, Direct: 0 mg/dL (ref 0.0–0.3)
Total Bilirubin: 0.3 mg/dL (ref 0.2–1.2)
Total Protein: 7.2 g/dL (ref 6.0–8.3)

## 2013-11-28 LAB — LIPID PANEL
Cholesterol: 219 mg/dL — ABNORMAL HIGH (ref 0–200)
HDL: 37.5 mg/dL — ABNORMAL LOW (ref >39.00)
LDL Cholesterol: 149 mg/dL — ABNORMAL HIGH (ref 0–99)
NonHDL: 181.5
Total CHOL/HDL Ratio: 6
Triglycerides: 162 mg/dL — ABNORMAL HIGH (ref 0.0–149.0)
VLDL: 32.4 mg/dL (ref 0.0–40.0)

## 2013-11-28 LAB — BASIC METABOLIC PANEL
BUN: 13 mg/dL (ref 6–23)
CO2: 25 mEq/L (ref 19–32)
Calcium: 10.1 mg/dL (ref 8.4–10.5)
Chloride: 107 mEq/L (ref 96–112)
Creatinine, Ser: 0.8 mg/dL (ref 0.4–1.2)
GFR: 77.71 mL/min (ref >60.00)
Glucose, Bld: 103 mg/dL — ABNORMAL HIGH (ref 70–99)
Potassium: 3.8 mEq/L (ref 3.5–5.1)
Sodium: 141 mEq/L (ref 135–145)

## 2013-11-28 LAB — TSH: TSH: 5 u[IU]/mL — ABNORMAL HIGH (ref 0.35–4.50)

## 2013-12-04 ENCOUNTER — Encounter: Payer: Self-pay | Admitting: Family Medicine

## 2013-12-04 ENCOUNTER — Ambulatory Visit (INDEPENDENT_AMBULATORY_CARE_PROVIDER_SITE_OTHER): Payer: 59 | Admitting: Family Medicine

## 2013-12-04 VITALS — BP 120/80 | Temp 98.0°F | Ht 67.5 in | Wt 210.0 lb

## 2013-12-04 DIAGNOSIS — M3500 Sicca syndrome, unspecified: Secondary | ICD-10-CM

## 2013-12-04 DIAGNOSIS — Z8669 Personal history of other diseases of the nervous system and sense organs: Secondary | ICD-10-CM

## 2013-12-04 DIAGNOSIS — K219 Gastro-esophageal reflux disease without esophagitis: Secondary | ICD-10-CM

## 2013-12-04 DIAGNOSIS — G43809 Other migraine, not intractable, without status migrainosus: Secondary | ICD-10-CM

## 2013-12-04 DIAGNOSIS — E038 Other specified hypothyroidism: Secondary | ICD-10-CM

## 2013-12-04 DIAGNOSIS — K2100 Gastro-esophageal reflux disease with esophagitis, without bleeding: Secondary | ICD-10-CM

## 2013-12-04 DIAGNOSIS — E785 Hyperlipidemia, unspecified: Secondary | ICD-10-CM

## 2013-12-04 DIAGNOSIS — M069 Rheumatoid arthritis, unspecified: Secondary | ICD-10-CM

## 2013-12-04 DIAGNOSIS — I10 Essential (primary) hypertension: Secondary | ICD-10-CM

## 2013-12-04 DIAGNOSIS — N952 Postmenopausal atrophic vaginitis: Secondary | ICD-10-CM

## 2013-12-04 DIAGNOSIS — L409 Psoriasis, unspecified: Secondary | ICD-10-CM

## 2013-12-04 MED ORDER — TOPIRAMATE 25 MG PO TABS: ORAL_TABLET | ORAL | Status: DC

## 2013-12-04 MED ORDER — SUMATRIPTAN SUCCINATE 50 MG PO TABS: ORAL_TABLET | ORAL | Status: DC

## 2013-12-04 MED ORDER — ESOMEPRAZOLE MAGNESIUM 40 MG PO CPDR: DELAYED_RELEASE_CAPSULE | ORAL | Status: DC

## 2013-12-04 MED ORDER — TOPIRAMATE 100 MG PO TABS: ORAL_TABLET | ORAL | Status: DC

## 2013-12-04 MED ORDER — LISINOPRIL-HYDROCHLOROTHIAZIDE 20-12.5 MG PO TABS: ORAL_TABLET | ORAL | Status: DC

## 2013-12-04 MED ORDER — ESTROGENS, CONJUGATED 0.625 MG/GM VA CREA: 1.0000 | TOPICAL_CREAM | Freq: Every day | VAGINAL | Status: DC

## 2013-12-04 MED ORDER — LEVOTHYROXINE SODIUM 100 MCG PO TABS: 100.0000 ug | ORAL_TABLET | Freq: Every day | ORAL | Status: DC

## 2013-12-04 NOTE — Patient Instructions (Signed)
Increase the Topamax ........125 mg daily for 1 month then go back to 100 mg  Increase your Synthroid to 100 g daily  Walk 30 minutes daily  Follow-up in 1 year sooner if any problems

## 2013-12-04 NOTE — Progress Notes (Signed)
Subjective:    Patient ID: Cheryl Knight, female    DOB: 1955/10/11, 58 y.o.   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  She gets routine eye care, dental care, BSE monthly, and you mammography, colonoscopy by Dr. Maurene Capes within 10 years normal no history of any GI complaints,  Vaccinations updated by Apolonio Schneiders  She says she feels well except she's having more migraine headaches. She takes her Topamax 100 mg daily but is said to bad migraines in the past month. Both of which she woke up with an the Imitrex didn't help.  She continues to struggle with her weight is 210 pounds.  Med list review its correct   Review of Systems  Constitutional: Negative.   HENT: Negative.   Eyes: Negative.   Respiratory: Negative.   Cardiovascular: Negative.   Gastrointestinal: Negative.   Endocrine: Negative.   Genitourinary: Negative.   Musculoskeletal: Negative.   Skin: Negative.   Allergic/Immunologic: Negative.   Neurological: Negative.   Hematological: Negative.   Psychiatric/Behavioral: Negative.        Objective:   Physical Exam  Constitutional: She appears well-developed and well-nourished.  HENT:  Head: Normocephalic and atraumatic.  Right Ear: External ear normal.  Left Ear: External ear normal.  Nose: Nose normal.  Mouth/Throat: Oropharynx is clear and moist.  Eyes: EOM are normal. Pupils are equal, round, and reactive to light.  Neck: Normal range of motion. Neck supple. No JVD present. No tracheal deviation present. No thyromegaly present.  Cardiovascular: Normal rate, regular rhythm, normal heart sounds and intact distal pulses.  Exam  reveals no gallop and no friction rub.   No murmur heard. No carotid nor aortic bruits peripheral pulses 2+ and symmetrical  Pulmonary/Chest: Effort normal and breath sounds normal. No stridor. No respiratory distress. She has no wheezes. She has no rales. She exhibits no tenderness.  Abdominal: Soft. Bowel sounds are normal. She exhibits no distension and no mass. There is no tenderness. There is no rebound and no guarding.  Genitourinary:  Bilateral breast exam normal  Pelvic exam not indicated  Musculoskeletal: Normal range of motion.  Lymphadenopathy:    She has no cervical adenopathy.  Neurological: She is alert. She has normal reflexes. No cranial nerve deficit. She exhibits normal muscle tone. Coordination normal.  Skin: Skin is warm and dry. No rash noted. No erythema. No pallor.  Total body skin exam normal except for scar midline chest were took off a squamous cell carcinoma years ago. She also sees a dermatologist now for follow-up yearly  Psychiatric: She has a normal mood and affect. Her behavior is normal. Judgment and thought content normal.  Nursing note and vitals reviewed.         Assessment & Plan:  Healthy female  Migraine headaches,,,,,,,, breakthrough on Topamax 100 mg daily,,,,, increase Topamax 250 mg daily for 1 month then go back to 100 mg daily  Hypothyroidism ,,,,,,,,,,,, increase Synthroid 100 g daily  Hypertension ago continue current therapy  Chronic back pain Flexeril and tramadol and/or Vicodin when necessary  Chronic reflux esophagitis continue Nexium  Postmenopausal vaginal dryness continue Premarin cream twice weekly  Rheumatoid arthritis followed by rheumatology and methotrexate every 8 weeks  History of chronic squamous cell carcinoma anterior chest wall........ Normal skin exam today

## 2013-12-04 NOTE — Progress Notes (Signed)
Pre visit review using our clinic review tool, if applicable. No additional management support is needed unless otherwise documented below in the visit note. 

## 2013-12-06 ENCOUNTER — Telehealth: Payer: Self-pay | Admitting: Family Medicine

## 2013-12-06 NOTE — Telephone Encounter (Signed)
emmi emailed °

## 2013-12-28 ENCOUNTER — Other Ambulatory Visit: Payer: Self-pay | Admitting: Family Medicine

## 2013-12-31 ENCOUNTER — Telehealth: Payer: Self-pay | Admitting: Family Medicine

## 2013-12-31 NOTE — Telephone Encounter (Signed)
Per [employer] Clobetasol Propionate is a Plan Exclusion, a PA could not be submitted.  Fax received states patient can call member services for additional information on plan benefits. I was given the name of Betamethasone dipropionate as a covered alternative when I spoke to customer service.

## 2014-01-04 MED ORDER — BETAMETHASONE DIPROPIONATE 0.05 % EX CREA: TOPICAL_CREAM | Freq: Two times a day (BID) | CUTANEOUS | Status: DC

## 2014-01-04 NOTE — Telephone Encounter (Signed)
rx sent

## 2014-03-03 ENCOUNTER — Other Ambulatory Visit: Payer: Self-pay | Admitting: Family Medicine

## 2014-06-24 ENCOUNTER — Other Ambulatory Visit: Payer: Self-pay | Admitting: Family Medicine

## 2014-08-03 LAB — HM MAMMOGRAPHY

## 2014-08-12 LAB — BASIC METABOLIC PANEL
BUN: 16 mg/dL (ref 4–21)
Creatinine: 0.7 mg/dL (ref <=1.1)
Glucose: 99 mg/dL
Potassium: 4 mmol/L (ref 3.4–5.3)
Sodium: 140 mmol/L (ref 137–147)

## 2014-08-12 LAB — HEPATIC FUNCTION PANEL
ALT: 10 U/L (ref 7–35)
AST: 16 U/L (ref 13–35)

## 2014-08-12 LAB — CBC AND DIFFERENTIAL
HCT: 41 % (ref 36–46)
Hemoglobin: 13.8 g/dL (ref 12.0–16.0)
WBC: 7.4 10^3/mL

## 2014-08-15 ENCOUNTER — Encounter: Payer: Self-pay | Admitting: Family Medicine

## 2014-08-20 ENCOUNTER — Encounter: Payer: Self-pay | Admitting: Family Medicine

## 2014-09-03 ENCOUNTER — Encounter: Payer: Self-pay | Admitting: Family Medicine

## 2014-12-03 ENCOUNTER — Other Ambulatory Visit: Payer: Self-pay | Admitting: Family Medicine

## 2014-12-05 ENCOUNTER — Other Ambulatory Visit: Payer: Self-pay

## 2014-12-10 ENCOUNTER — Other Ambulatory Visit: Payer: Self-pay

## 2014-12-17 ENCOUNTER — Ambulatory Visit (INDEPENDENT_AMBULATORY_CARE_PROVIDER_SITE_OTHER): Payer: Commercial Managed Care - HMO | Admitting: Family Medicine

## 2014-12-17 DIAGNOSIS — E038 Other specified hypothyroidism: Secondary | ICD-10-CM

## 2014-12-17 DIAGNOSIS — K219 Gastro-esophageal reflux disease without esophagitis: Secondary | ICD-10-CM

## 2014-12-17 DIAGNOSIS — E669 Obesity, unspecified: Secondary | ICD-10-CM

## 2014-12-17 DIAGNOSIS — Z23 Encounter for immunization: Secondary | ICD-10-CM

## 2014-12-17 DIAGNOSIS — Z8669 Personal history of other diseases of the nervous system and sense organs: Secondary | ICD-10-CM

## 2014-12-17 DIAGNOSIS — Z0000 Encounter for general adult medical examination without abnormal findings: Secondary | ICD-10-CM

## 2014-12-17 DIAGNOSIS — E785 Hyperlipidemia, unspecified: Secondary | ICD-10-CM

## 2014-12-17 DIAGNOSIS — I10 Essential (primary) hypertension: Secondary | ICD-10-CM | POA: Diagnosis not present

## 2014-12-17 DIAGNOSIS — N952 Postmenopausal atrophic vaginitis: Secondary | ICD-10-CM

## 2014-12-17 DIAGNOSIS — M05719 Rheumatoid arthritis with rheumatoid factor of unspecified shoulder without organ or systems involvement: Secondary | ICD-10-CM

## 2014-12-17 DIAGNOSIS — G43809 Other migraine, not intractable, without status migrainosus: Secondary | ICD-10-CM

## 2014-12-17 DIAGNOSIS — K2100 Gastro-esophageal reflux disease with esophagitis, without bleeding: Secondary | ICD-10-CM

## 2014-12-17 LAB — BASIC METABOLIC PANEL
BUN: 14 mg/dL (ref 6–23)
CO2: 29 mEq/L (ref 19–32)
Calcium: 10.2 mg/dL (ref 8.4–10.5)
Chloride: 104 mEq/L (ref 96–112)
Creatinine, Ser: 0.8 mg/dL (ref 0.40–1.20)
GFR: 77.43 mL/min (ref >60.00)
Glucose, Bld: 112 mg/dL — ABNORMAL HIGH (ref 70–99)
Potassium: 4.2 mEq/L (ref 3.5–5.1)
Sodium: 143 mEq/L (ref 135–145)

## 2014-12-17 LAB — CBC WITH DIFFERENTIAL/PLATELET
Basophils Absolute: 0.1 10*3/uL (ref 0.0–0.1)
Basophils Relative: 0.9 % (ref 0.0–3.0)
Eosinophils Absolute: 0.2 10*3/uL (ref 0.0–0.7)
Eosinophils Relative: 3.4 % (ref 0.0–5.0)
HCT: 44 % (ref 36.0–46.0)
Hemoglobin: 14.6 g/dL (ref 12.0–15.0)
Lymphocytes Relative: 46.2 % — ABNORMAL HIGH (ref 12.0–46.0)
Lymphs Abs: 2.9 10*3/uL (ref 0.7–4.0)
MCHC: 33.1 g/dL (ref 30.0–36.0)
MCV: 93.5 fl (ref 78.0–100.0)
Monocytes Absolute: 0.5 10*3/uL (ref 0.1–1.0)
Monocytes Relative: 8.1 % (ref 3.0–12.0)
Neutro Abs: 2.6 10*3/uL (ref 1.4–7.7)
Neutrophils Relative %: 41.4 % — ABNORMAL LOW (ref 43.0–77.0)
Platelets: 238 10*3/uL (ref 150.0–400.0)
RBC: 4.71 Mil/uL (ref 3.87–5.11)
RDW: 14.3 % (ref 11.5–15.5)
WBC: 6.2 10*3/uL (ref 4.0–10.5)

## 2014-12-17 LAB — HEPATIC FUNCTION PANEL
ALT: 13 U/L (ref 0–35)
AST: 17 U/L (ref 0–37)
Albumin: 4.4 g/dL (ref 3.5–5.2)
Alkaline Phosphatase: 56 U/L (ref 39–117)
Bilirubin, Direct: 0.1 mg/dL (ref 0.0–0.3)
Total Bilirubin: 0.3 mg/dL (ref 0.2–1.2)
Total Protein: 7 g/dL (ref 6.0–8.3)

## 2014-12-17 LAB — LIPID PANEL
Cholesterol: 243 mg/dL — ABNORMAL HIGH (ref 0–200)
HDL: 44.8 mg/dL (ref >39.00)
LDL Cholesterol: 171 mg/dL — ABNORMAL HIGH (ref 0–99)
NonHDL: 197.78
Total CHOL/HDL Ratio: 5
Triglycerides: 132 mg/dL (ref 0.0–149.0)
VLDL: 26.4 mg/dL (ref 0.0–40.0)

## 2014-12-17 LAB — POCT URINALYSIS DIPSTICK
Bilirubin, UA: NEGATIVE
Glucose, UA: NEGATIVE
Ketones, UA: NEGATIVE
Nitrite, UA: NEGATIVE
Protein, UA: NEGATIVE
Spec Grav, UA: 1.025
Urobilinogen, UA: 0.2
pH, UA: 5.5

## 2014-12-17 LAB — TSH: TSH: 2.93 u[IU]/mL (ref 0.35–4.50)

## 2014-12-17 LAB — HEMOGLOBIN A1C: Hgb A1c MFr Bld: 6.7 % — ABNORMAL HIGH (ref 4.6–6.5)

## 2014-12-17 MED ORDER — TOPIRAMATE 25 MG PO TABS: ORAL_TABLET | ORAL | Status: DC

## 2014-12-17 MED ORDER — ESOMEPRAZOLE MAGNESIUM 40 MG PO CPDR: DELAYED_RELEASE_CAPSULE | ORAL | Status: DC

## 2014-12-17 MED ORDER — SUMATRIPTAN SUCCINATE 50 MG PO TABS: ORAL_TABLET | ORAL | Status: DC

## 2014-12-17 MED ORDER — CYCLOBENZAPRINE HCL 10 MG PO TABS: 10.0000 mg | ORAL_TABLET | Freq: Every day | ORAL | Status: DC

## 2014-12-17 MED ORDER — TRAMADOL HCL (ER BIPHASIC) 100 MG PO TB24: ORAL_TABLET | ORAL | Status: DC

## 2014-12-17 MED ORDER — LISINOPRIL-HYDROCHLOROTHIAZIDE 20-12.5 MG PO TABS: ORAL_TABLET | ORAL | Status: DC

## 2014-12-17 MED ORDER — TOPIRAMATE 100 MG PO TABS
ORAL_TABLET | ORAL | Status: DC
Start: 2014-12-17 — End: 2015-12-30

## 2014-12-17 MED ORDER — LEVOTHYROXINE SODIUM 100 MCG PO TABS: 100.0000 ug | ORAL_TABLET | Freq: Every day | ORAL | Status: DC

## 2014-12-17 NOTE — Patient Instructions (Signed)
Continue current medications  Labs today,,,,,,,,, I will call you the report if there is anything outstanding  No carbohydrate diet  Continue walking daily  Follow-up in one year sooner if any problems  Tommi Rumps or Almyra Free or Dr. [place],,,,,,,,

## 2014-12-17 NOTE — Progress Notes (Signed)
Subjective:    Patient ID: Cheryl Knight, female    DOB: [birth year], 59 y.o.   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 She sees her rheumatologist on a regular basis. She is on methotrexate 8 mg weekly. She is Nexium for reflux esophagitis, Premarin cream for postmenopausal vaginal dryness, Synthroid 100 g daily for hypothyroidism. BP normal on medication  She is on Topamax 125 mg daily to prevent the migraines.  She gets routine eye care, dental care, BSE monthly, annual mammography,: 10 years ago by Dr. Maurene Capes. She was asked to call GI to find out when her next colonoscopy is due. She walks daily. It's hunting season and now she's bow hunting.  She had her uterus shows removal she was 38 for nonmalignant reasons therefore pelvic and Pap no longer indicated  Should be given a tetanus booster today. She declines a flu shot Pneumovax and shingles vaccines.  She is class I obesity weight is unchanged from last year 212 pounds. We discussed a low carbohydrate diet as we have in the past.   Review of Systems  Constitutional: Negative.   HENT: Negative.   Eyes: Negative.   Respiratory: Negative.   Cardiovascular: Negative.   Gastrointestinal: Negative.   Endocrine: Negative.   Genitourinary: Negative.   Musculoskeletal: Negative.   Skin: Negative.   Allergic/Immunologic: Negative.   Neurological: Negative.   Hematological: Negative.   Psychiatric/Behavioral: Negative.        Objective:   Physical Exam  Constitutional: She appears well-developed and well-nourished.    HENT:  Head: Normocephalic and atraumatic.  Right Ear: External ear normal.  Left Ear: External ear normal.  Nose: Nose normal.  Mouth/Throat: Oropharynx is clear and moist.  Eyes: EOM are normal. Pupils are equal, round, and reactive to light.  Neck: Normal range of motion. Neck supple. No JVD present. No tracheal deviation present. No thyromegaly present.  Cardiovascular: Normal rate, regular rhythm, normal heart sounds and intact distal pulses.  Exam reveals no gallop and no friction rub.   No murmur heard. Pulmonary/Chest: Effort normal and breath sounds normal. No stridor. No respiratory distress. She has no wheezes. She has no rales. She exhibits no tenderness.  Abdominal: Soft. Bowel sounds are normal. She exhibits no distension and no mass. There is no tenderness. There is no rebound and no guarding.  Genitourinary:  Bilateral breast exam normal  Musculoskeletal: Normal range of motion.  Lymphadenopathy:    She has no cervical adenopathy.  Neurological: She is alert. She has normal reflexes. No cranial nerve deficit. She exhibits normal muscle tone. Coordination normal.  Skin: Skin is warm and dry. No rash noted. No erythema. No pallor.  Total body skin exam normal except for scar from previous lesion on her chest that was removed it was a basal cell carcinoma. She had a complete skin exam by her dermatologist in January  Psychiatric: She has a normal mood and affect. Her behavior is normal. Judgment and thought content normal.  Vitals reviewed.         Assessment & Plan:  Rheumatoid arthritis........ continue current medicines followed by rheumatology  Hypertension ........ continue current medications  Hypothyroidism ......... continue Synthroid  Allergic rhinitis ....... continue current medications  Reflux esophagitis .....Marland Kitchen continue Nexium  History of postmenopausal vaginal dryness .....Marland Kitchen continue Premarin cream twice weekly  Chronic back pain continue tramadol  100 mg daily  Obesity again discussed diet exercise and weight loss ........Marland Kitchen

## 2014-12-17 NOTE — Progress Notes (Signed)
Pre visit review using our clinic review tool, if applicable. No additional management support is needed unless otherwise documented below in the visit note. 

## 2014-12-19 ENCOUNTER — Other Ambulatory Visit: Payer: Self-pay | Admitting: Family Medicine

## 2014-12-19 MED ORDER — PRAVASTATIN SODIUM 20 MG PO TABS: 20.0000 mg | ORAL_TABLET | Freq: Every day | ORAL | Status: DC

## 2015-01-07 ENCOUNTER — Other Ambulatory Visit: Payer: Self-pay | Admitting: Family Medicine

## 2015-03-10 ENCOUNTER — Ambulatory Visit (INDEPENDENT_AMBULATORY_CARE_PROVIDER_SITE_OTHER): Payer: Commercial Managed Care - HMO | Admitting: Family Medicine

## 2015-03-10 ENCOUNTER — Encounter: Payer: Self-pay | Admitting: Family Medicine

## 2015-03-10 VITALS — BP 102/80 | HR 105 | Temp 98.1°F | Wt 216.0 lb

## 2015-03-10 DIAGNOSIS — R8290 Unspecified abnormal findings in urine: Secondary | ICD-10-CM

## 2015-03-10 DIAGNOSIS — R300 Dysuria: Secondary | ICD-10-CM | POA: Diagnosis not present

## 2015-03-10 LAB — POC URINALSYSI DIPSTICK (AUTOMATED)
Bilirubin, UA: NEGATIVE
Blood, UA: NEGATIVE
Glucose, UA: NEGATIVE
Ketones, UA: NEGATIVE
Leukocytes, UA: NEGATIVE
Nitrite, UA: NEGATIVE
Protein, UA: NEGATIVE
Spec Grav, UA: 1.015
Urobilinogen, UA: 0.2
pH, UA: 6

## 2015-03-10 MED ORDER — NITROFURANTOIN MONOHYD MACRO 100 MG PO CAPS
100.0000 mg | ORAL_CAPSULE | Freq: Two times a day (BID) | ORAL | Status: DC
Start: 2015-03-10 — End: 2017-07-05

## 2015-03-10 NOTE — Progress Notes (Signed)
Subjective:    Patient ID: Cheryl Knight, female    DOB: [birth year], 60 y.o.   MRN: [id number]  HPI  Cheryl Knight is a 60 year old female who presents today with dysuria and abnormal odor to her urine for 2 days.  She has a history of UTIs, rheumatoid arthritis, and atrophic vaginitis.  She denies fever, N/V/D, flank pain, gross hematuria, and vaginal discharge today.  Treatment at home includes increasing her fluid intake and also drinking cranberry juice which has provided no benefit to symptoms at this time. HR today is noted at 105. Patient notes that her HR baseline is noted between 100-105. She states that this has been present for many years since she was young. She denies any chest pain, arm pain, SOB, fatigue, or palpitations.    Review of Systems  Constitutional: Negative for fever, chills and fatigue.  Respiratory: Negative for cough and shortness of breath.   Cardiovascular: Negative for chest pain, palpitations and leg swelling.  Gastrointestinal: Negative for nausea, vomiting and diarrhea.  Genitourinary: Positive for dysuria, urgency and frequency. Negative for hematuria, flank pain and vaginal discharge.  Skin: Negative for rash.  Neurological: Negative for light-headedness and headaches.   No past medical history on file.  Social History   Social History  . Marital Status: Married    Spouse Name: N/A  . Number of Children: N/A  . Years of Education: N/A   Occupational History  . Not on file.   Social History Main Topics  . Smoking status: Passive Smoke Exposure - Never Smoker  . Smokeless tobacco: Not on file  . Alcohol Use: Not on file  . Drug Use: Not on file  . Sexual Activity: Not on file   Other Topics Concern  . Not on file   Social History Narrative    No past surgical history on file.  No family history on file.  Allergies  Allergen Reactions  . Meperidine Hcl     REACTION: vomiting  . Pentazocine Lactate     REACTION:  hallucinations    Current Outpatient Prescriptions on File Prior to Visit  Medication Sig Dispense Refill  . betamethasone dipropionate (DIPROLENE) 0.05 % cream Apply topically 2 (two) times daily. 45 g 3  . Calcium Carbonate-Vitamin D (CALCIUM PLUS VITAMIN D PO) Take by mouth.    . cetirizine (ZYRTEC) 10 MG tablet Take 10 mg by mouth daily.    . clobetasol (OLUX) 0.05 % topical foam Apply topically 2 (two) times daily.    Marland Kitchen conjugated estrogens (PREMARIN) vaginal cream Place 1 Applicatorful vaginally daily. 42.5 g 12  . cyclobenzaprine (FLEXERIL) 10 MG tablet Take 1 tablet (10 mg total) by mouth daily. 100 tablet 3  . diclofenac sodium (VOLTAREN) 1 % GEL Apply topically 4 (four) times daily.    Marland Kitchen esomeprazole (NEXIUM) 40 MG capsule TAKE ONE CAPSULE DAILY BEFORE BREAKFAST 100 capsule 3  . esomeprazole (NEXIUM) 40 MG capsule take 1 capsule by mouth every morning 100 capsule 3  . fish oil-omega-3 fatty acids 1000 MG capsule Take 1 g by mouth daily.    . folic acid (FOLVITE) 1 MG tablet     . glucosamine-chondroitin 500-400 MG tablet Take 1 tablet by mouth 3 (three) times daily.    Marland Kitchen HYDROcodone-acetaminophen (NORCO) 7.5-325 MG per tablet Half to one tab as needed for migraine 30 tablet 0  . levothyroxine (SYNTHROID, LEVOTHROID) 100 MCG tablet Take 1 tablet (100 mcg total) by mouth daily. 100 tablet 3  .  lisinopril-hydrochlorothiazide (PRINZIDE,ZESTORETIC) 20-12.5 MG tablet take 1 tablet by mouth once daily 100 tablet 3  . methotrexate (RHEUMATREX) 2.5 MG tablet Take 1 tablet (2.5 mg total) by mouth once a week. 8 tabs weekly 100 tablet 2  . pilocarpine (SALAGEN) 5 MG tablet     . pravastatin (PRAVACHOL) 20 MG tablet Take 1 tablet (20 mg total) by mouth daily. 90 tablet 3  . promethazine (PHENERGAN) 25 MG tablet Take 1 tablet (25 mg total) by mouth every 6 (six) hours as needed. 30 tablet 2  . RESTASIS 0.05 % ophthalmic emulsion   0  . SUMAtriptan (IMITREX) 50 MG tablet TAKE ONE TABLET EVERY 2  HOURS AS NEEDED FOR MIGRAINE 6 tablet 6  . topiramate (TOPAMAX) 100 MG tablet take 1 tablet by mouth once daily 100 tablet 3  . topiramate (TOPAMAX) 100 MG tablet take 1 tablet by mouth once daily 100 tablet 3  . TraMADol HCl 100 MG TB24 1 tablet every morning for chronic pain 100 tablet 3   No current facility-administered medications on file prior to visit.    BP 102/80 mmHg  Pulse 105  Temp(Src) 98.1 F (36.7 C) (Oral)  Wt 216 lb (97.977 kg)  SpO2 96%       Objective:   Physical Exam  Constitutional: She is oriented to person, place, and time. She appears well-developed and well-nourished.  Cardiovascular: Regular rhythm and normal heart sounds.  Exam reveals no gallop and no friction rub.   No murmur heard. Pulmonary/Chest: Effort normal and breath sounds normal. She has no wheezes. She has no rales.  Abdominal: Soft. Bowel sounds are normal.  Suprapubic discomfort noted  Lymphadenopathy:    She has no cervical adenopathy.  Neurological: She is alert and oriented to person, place, and time.  Skin: Skin is warm and dry. No rash noted.       Assessment & Plan:  1. Dysuria - POCT Urinalysis Dipstick (Automated) - Culture, Urine - nitrofurantoin, macrocrystal-monohydrate, (MACROBID) 100 MG capsule; Take 1 capsule (100 mg total) by mouth 2 (two) times daily.  Dispense: 10 capsule; Refill: 0 Patient has a history of UTIs and has noted an abnormal urine odor. History and exam suggest possible UTI. Empiric treatment started with urine culture obtained. Results will be called to patient.  2. Abnormal urine odor - nitrofurantoin, macrocrystal-monohydrate, (MACROBID) 100 MG capsule; Take 1 capsule (100 mg total) by mouth 2 (two) times daily.  Dispense: 10 capsule; Refill: 0  Advised patient to increase fluids and notify clinic if symptoms do not improve in 2-3 days. Discussed use of premarin vaginal cream for symptoms of atrophic vaginitis as prescribed by her PCP. Also discussed  prevention for UTIs as well as risks of medication.

## 2015-03-10 NOTE — Patient Instructions (Signed)
Please take antibiotic as directed for burning when urinating and urine odor.  Urine culture results will be called to you. Increase fluids and if symptoms do not improve in 2-3 days, please contact clinic for further evaluation.  It was a pleasure meeting you today,  Urinary Tract Infection Urinary tract infections (UTIs) can develop anywhere along your urinary tract. Your urinary tract is your body's drainage system for removing wastes and extra water. Your urinary tract includes two kidneys, two ureters, a bladder, and a urethra. Your kidneys are a pair of bean-shaped organs. Each kidney is about the size of your fist. They are located below your ribs, one on each side of your spine. CAUSES Infections are caused by microbes, which are microscopic organisms, including fungi, viruses, and bacteria. These organisms are so small that they can only be seen through a microscope. Bacteria are the microbes that most commonly cause UTIs. SYMPTOMS  Symptoms of UTIs may vary by age and gender of the patient and by the location of the infection. Symptoms in young women typically include a frequent and intense urge to urinate and a painful, burning feeling in the bladder or urethra during urination. Older women and men are more likely to be tired, shaky, and weak and have muscle aches and abdominal pain. A fever may mean the infection is in your kidneys. Other symptoms of a kidney infection include pain in your back or sides below the ribs, nausea, and vomiting. DIAGNOSIS To diagnose a UTI, your caregiver will ask you about your symptoms. Your caregiver will also ask you to provide a urine sample. The urine sample will be tested for bacteria and white blood cells. White blood cells are made by your body to help fight infection. TREATMENT  Typically, UTIs can be treated with medication. Because most UTIs are caused by a bacterial infection, they usually can be treated with the use of antibiotics. The choice of  antibiotic and length of treatment depend on your symptoms and the type of bacteria causing your infection. HOME CARE INSTRUCTIONS  If you were prescribed antibiotics, take them exactly as your caregiver instructs you. Finish the medication even if you feel better after you have only taken some of the medication.  Drink enough water and fluids to keep your urine clear or pale yellow.  Avoid caffeine, tea, and carbonated beverages. They tend to irritate your bladder.  Empty your bladder often. Avoid holding urine for long periods of time.  Empty your bladder before and after sexual intercourse.  After a bowel movement, women should cleanse from front to back. Use each tissue only once. SEEK MEDICAL CARE IF:   You have back pain.  You develop a fever.  Your symptoms do not begin to resolve within 3 days. SEEK IMMEDIATE MEDICAL CARE IF:   You have severe back pain or lower abdominal pain.  You develop chills.  You have nausea or vomiting.  You have continued burning or discomfort with urination. MAKE SURE YOU:   Understand these instructions.  Will watch your condition.  Will get help right away if you are not doing well or get worse.   This information is not intended to replace advice given to you by your health care provider. Make sure you discuss any questions you have with your health care provider.   Document Released: 10/14/2004 Document Revised: 09/25/2014 Document Reviewed: 02/12/2011 Elsevier Interactive Patient Education [employer].

## 2015-03-14 LAB — URINE CULTURE: Colony Count: 50000

## 2015-03-17 ENCOUNTER — Telehealth: Payer: Self-pay | Admitting: Family Medicine

## 2015-03-17 NOTE — Telephone Encounter (Signed)
Urine culture results indicate bacteria present and current antibiotic is noted to have intermediate coverage. Bacteria is 50,000 colonies. With current medication regimen, nitrofurantoin does not have interactions with methotrexate therefore suggest that patient remain with this current regimen unless symptoms do not improve or worsen. Follow up indicated if symptoms are present or have not improved as expected.

## 2015-03-17 NOTE — Telephone Encounter (Signed)
Voicemail was left for pt to return my call   

## 2015-05-23 ENCOUNTER — Encounter: Payer: Self-pay | Admitting: Internal Medicine

## 2015-06-05 ENCOUNTER — Other Ambulatory Visit: Payer: Self-pay | Admitting: Family Medicine

## 2015-09-16 LAB — HM MAMMOGRAPHY

## 2015-09-25 ENCOUNTER — Encounter: Payer: Self-pay | Admitting: Family Medicine

## 2015-12-05 ENCOUNTER — Other Ambulatory Visit: Payer: Self-pay | Admitting: Rheumatology

## 2015-12-08 NOTE — Telephone Encounter (Signed)
Last visit 08/19/15 Next visit 02/10/16 Ok to refill per Mr Carlyon Shadow

## 2015-12-19 ENCOUNTER — Encounter: Payer: Self-pay | Admitting: *Deleted

## 2015-12-19 ENCOUNTER — Other Ambulatory Visit: Payer: Self-pay | Admitting: *Deleted

## 2015-12-19 DIAGNOSIS — Z79899 Other long term (current) drug therapy: Secondary | ICD-10-CM

## 2015-12-19 LAB — CBC WITH DIFFERENTIAL/PLATELET
Basophils Absolute: 69 cells/uL (ref 0–200)
Basophils Relative: 1 %
Eosinophils Absolute: 207 cells/uL (ref 15–500)
Eosinophils Relative: 3 %
HCT: 40.8 % (ref 35.0–45.0)
Hemoglobin: 13.5 g/dL (ref 11.7–15.5)
Lymphocytes Relative: 48 %
Lymphs Abs: 3312 cells/uL (ref 850–3900)
MCH: 31.4 pg (ref 27.0–33.0)
MCHC: 33.1 g/dL (ref 32.0–36.0)
MCV: 94.9 fL (ref 80.0–100.0)
MPV: 9.7 fL (ref 7.5–12.5)
Monocytes Absolute: 690 cells/uL (ref 200–950)
Monocytes Relative: 10 %
Neutro Abs: 2622 cells/uL (ref 1500–7800)
Neutrophils Relative %: 38 %
Platelets: 291 10*3/uL (ref 140–400)
RBC: 4.3 MIL/uL (ref 3.80–5.10)
RDW: 14 % (ref 11.0–15.0)
WBC: 6.9 10*3/uL (ref 3.8–10.8)

## 2015-12-19 LAB — COMPLETE METABOLIC PANEL WITH GFR
ALT: 10 U/L (ref 6–29)
AST: 15 U/L (ref 10–35)
Albumin: 4.1 g/dL (ref 3.6–5.1)
Alkaline Phosphatase: 36 U/L (ref 33–130)
BUN: 11 mg/dL (ref 7–25)
CO2: 23 mmol/L (ref 20–31)
Calcium: 9.6 mg/dL (ref 8.6–10.4)
Chloride: 106 mmol/L (ref 98–110)
Creat: 0.88 mg/dL (ref 0.50–0.99)
GFR, Est African American: 81 mL/min (ref >=60)
GFR, Est Non African American: 71 mL/min (ref >=60)
Glucose, Bld: 122 mg/dL — ABNORMAL HIGH (ref 65–99)
Potassium: 3.5 mmol/L (ref 3.5–5.3)
Sodium: 141 mmol/L (ref 135–146)
Total Bilirubin: 0.2 mg/dL (ref 0.2–1.2)
Total Protein: 6.5 g/dL (ref 6.1–8.1)

## 2015-12-22 NOTE — Progress Notes (Signed)
Labs normal.

## 2015-12-23 ENCOUNTER — Other Ambulatory Visit: Payer: Self-pay | Admitting: Rheumatology

## 2015-12-24 NOTE — Telephone Encounter (Signed)
Last Visit: 08/19/15 Next Visit: 02/10/16 Labs: 12/19/15 WNL UDS: 08/19/15  Narc Agreement: 08/20/15   Okay to refill MTX and Tramadol?

## 2015-12-25 ENCOUNTER — Other Ambulatory Visit: Payer: Self-pay | Admitting: *Deleted

## 2015-12-28 ENCOUNTER — Other Ambulatory Visit: Payer: Self-pay | Admitting: Family Medicine

## 2015-12-30 ENCOUNTER — Ambulatory Visit (INDEPENDENT_AMBULATORY_CARE_PROVIDER_SITE_OTHER): Payer: Commercial Managed Care - HMO | Admitting: Family Medicine

## 2015-12-30 ENCOUNTER — Encounter: Payer: Self-pay | Admitting: Family Medicine

## 2015-12-30 VITALS — BP 100/80 | HR 92 | Temp 98.0°F | Ht 67.0 in | Wt <= 1120 oz

## 2015-12-30 DIAGNOSIS — G43009 Migraine without aura, not intractable, without status migrainosus: Secondary | ICD-10-CM

## 2015-12-30 DIAGNOSIS — G43C0 Periodic headache syndromes in child or adult, not intractable: Secondary | ICD-10-CM

## 2015-12-30 DIAGNOSIS — N952 Postmenopausal atrophic vaginitis: Secondary | ICD-10-CM

## 2015-12-30 DIAGNOSIS — E785 Hyperlipidemia, unspecified: Secondary | ICD-10-CM

## 2015-12-30 DIAGNOSIS — L408 Other psoriasis: Secondary | ICD-10-CM

## 2015-12-30 DIAGNOSIS — Z8669 Personal history of other diseases of the nervous system and sense organs: Secondary | ICD-10-CM

## 2015-12-30 DIAGNOSIS — K219 Gastro-esophageal reflux disease without esophagitis: Secondary | ICD-10-CM

## 2015-12-30 DIAGNOSIS — M05719 Rheumatoid arthritis with rheumatoid factor of unspecified shoulder without organ or systems involvement: Secondary | ICD-10-CM

## 2015-12-30 DIAGNOSIS — E6609 Other obesity due to excess calories: Secondary | ICD-10-CM

## 2015-12-30 DIAGNOSIS — E66811 Obesity, class 1: Secondary | ICD-10-CM

## 2015-12-30 DIAGNOSIS — I10 Essential (primary) hypertension: Secondary | ICD-10-CM | POA: Diagnosis not present

## 2015-12-30 DIAGNOSIS — G43709 Chronic migraine without aura, not intractable, without status migrainosus: Secondary | ICD-10-CM | POA: Diagnosis not present

## 2015-12-30 DIAGNOSIS — Z6830 Body mass index (BMI) 30.0-30.9, adult: Secondary | ICD-10-CM

## 2015-12-30 DIAGNOSIS — E038 Other specified hypothyroidism: Secondary | ICD-10-CM

## 2015-12-30 DIAGNOSIS — E669 Obesity, unspecified: Secondary | ICD-10-CM | POA: Insufficient documentation

## 2015-12-30 DIAGNOSIS — M3500 Sicca syndrome, unspecified: Secondary | ICD-10-CM

## 2015-12-30 DIAGNOSIS — Z0000 Encounter for general adult medical examination without abnormal findings: Secondary | ICD-10-CM

## 2015-12-30 DIAGNOSIS — E039 Hypothyroidism, unspecified: Secondary | ICD-10-CM

## 2015-12-30 LAB — POCT URINALYSIS DIPSTICK
Bilirubin, UA: NEGATIVE
Blood, UA: NEGATIVE
Glucose, UA: NEGATIVE
Ketones, UA: NEGATIVE
Leukocytes, UA: NEGATIVE
Nitrite, UA: NEGATIVE
Protein, UA: NEGATIVE
Spec Grav, UA: 1.015
Urobilinogen, UA: 0.2
pH, UA: 7

## 2015-12-30 LAB — BASIC METABOLIC PANEL
BUN: 14 mg/dL (ref 6–23)
CO2: 28 mEq/L (ref 19–32)
Calcium: 10.2 mg/dL (ref 8.4–10.5)
Chloride: 104 mEq/L (ref 96–112)
Creatinine, Ser: 0.7 mg/dL (ref 0.40–1.20)
GFR: 90.03 mL/min (ref >60.00)
Glucose, Bld: 118 mg/dL — ABNORMAL HIGH (ref 70–99)
Potassium: 3.9 mEq/L (ref 3.5–5.1)
Sodium: 142 mEq/L (ref 135–145)

## 2015-12-30 LAB — HEPATIC FUNCTION PANEL
ALT: 10 U/L (ref 0–35)
AST: 15 U/L (ref 0–37)
Albumin: 4.6 g/dL (ref 3.5–5.2)
Alkaline Phosphatase: 39 U/L (ref 39–117)
Bilirubin, Direct: 0.1 mg/dL (ref 0.0–0.3)
Total Bilirubin: 0.5 mg/dL (ref 0.2–1.2)
Total Protein: 7.1 g/dL (ref 6.0–8.3)

## 2015-12-30 LAB — CBC WITH DIFFERENTIAL/PLATELET
Basophils Absolute: 0.1 10*3/uL (ref 0.0–0.1)
Basophils Relative: 0.9 % (ref 0.0–3.0)
Eosinophils Absolute: 0.1 10*3/uL (ref 0.0–0.7)
Eosinophils Relative: 2.3 % (ref 0.0–5.0)
HCT: 43.4 % (ref 36.0–46.0)
Hemoglobin: 14.6 g/dL (ref 12.0–15.0)
Lymphocytes Relative: 43.6 % (ref 12.0–46.0)
Lymphs Abs: 2.8 10*3/uL (ref 0.7–4.0)
MCHC: 33.6 g/dL (ref 30.0–36.0)
MCV: 95.4 fl (ref 78.0–100.0)
Monocytes Absolute: 0.5 10*3/uL (ref 0.1–1.0)
Monocytes Relative: 7.4 % (ref 3.0–12.0)
Neutro Abs: 2.9 10*3/uL (ref 1.4–7.7)
Neutrophils Relative %: 45.8 % (ref 43.0–77.0)
Platelets: 306 10*3/uL (ref 150.0–400.0)
RBC: 4.55 Mil/uL (ref 3.87–5.11)
RDW: 14.3 % (ref 11.5–15.5)
WBC: 6.4 10*3/uL (ref 4.0–10.5)

## 2015-12-30 LAB — LIPID PANEL
Cholesterol: 223 mg/dL — ABNORMAL HIGH (ref 0–200)
HDL: 47.1 mg/dL (ref >39.00)
LDL Cholesterol: 150 mg/dL — ABNORMAL HIGH (ref 0–99)
NonHDL: 175.97
Total CHOL/HDL Ratio: 5
Triglycerides: 129 mg/dL (ref 0.0–149.0)
VLDL: 25.8 mg/dL (ref 0.0–40.0)

## 2015-12-30 LAB — TSH: TSH: 1.2 u[IU]/mL (ref 0.35–4.50)

## 2015-12-30 MED ORDER — PROMETHAZINE HCL 25 MG PO TABS: 25.0000 mg | ORAL_TABLET | Freq: Four times a day (QID) | ORAL | 2 refills | Status: DC | PRN

## 2015-12-30 MED ORDER — HYDROCODONE-ACETAMINOPHEN 7.5-325 MG PO TABS: ORAL_TABLET | ORAL | 0 refills | Status: DC

## 2015-12-30 MED ORDER — LISINOPRIL-HYDROCHLOROTHIAZIDE 20-12.5 MG PO TABS: ORAL_TABLET | ORAL | 3 refills | Status: DC

## 2015-12-30 MED ORDER — PRAVASTATIN SODIUM 20 MG PO TABS: 20.0000 mg | ORAL_TABLET | Freq: Every day | ORAL | 3 refills | Status: DC

## 2015-12-30 MED ORDER — ESTROGENS, CONJUGATED 0.625 MG/GM VA CREA: TOPICAL_CREAM | VAGINAL | 10 refills | Status: DC

## 2015-12-30 MED ORDER — TOPIRAMATE 100 MG PO TABS: ORAL_TABLET | ORAL | 3 refills | Status: DC

## 2015-12-30 MED ORDER — PREDNISONE 20 MG PO TABS
ORAL_TABLET | ORAL | 1 refills | Status: DC
Start: 2015-12-30 — End: 2017-02-08

## 2015-12-30 MED ORDER — LEVOTHYROXINE SODIUM 100 MCG PO TABS: 100.0000 ug | ORAL_TABLET | Freq: Every day | ORAL | 4 refills | Status: DC

## 2015-12-30 NOTE — Patient Instructions (Signed)
Strongly considered your vaccinations  Walking 30 minutes daily  Prednisone 20 mg........... one half tab daily for 2 weeks then one half tab Monday Wednesday Friday for 2 weeks when you're miserable from the arthritis  Return in one year for general physical sooner if any problems  Labs today labs today.......Marland Kitchen we will call you if there are any problems

## 2015-12-30 NOTE — Progress Notes (Signed)
Cheryl Knight is a 60 year old married female nonsmoker who comes in today for general physical exam because of numerous medical problems  Her primary problem is rheumatoid arthritis. She's been treated by rheumatology with oral methotrexate. They recently switched to the injectable content because her symptoms are getting worse.  She takes Nexium 40 mg because of chronic reflux  Uses Premarin vaginal cream when necessary for postmenopausal vaginal dryness. She had a uterus and ovaries removed in her 26s for chronic prolapse. Therefore pelvics no longer indicated  She takes Phenergan and Vicodin when necessary for migraine headaches. Her migraines have gotten markedly diminished with the Topamax 100 mg daily.  She takes Synthroid 100 g daily  She takes Zestoretic 20-12 0.5 daily for hypertension BP today 100/80 pulse 80 and regular  She takes Pravachol 20 mg Monday Wednesday Friday for hyperlipidemia  She's on tramadol 100 mg at bedtime because of severe joint pain.  She gets routine eye care, dental care, BSE monthly, annual mammography, colonoscopy 2010 was normal  Vaccinations tetanus booster 2016 she declines a flu shot shingles vaccine and pneumonia vaccines for reasons unknown.  Weight is 214 pounds. BMI 33.31 obese class I. We've talked her as we have in the past about diet exercise and weight loss. She had a nonfasting blood sugar 106 we'll get a fasting blood sugar today.  She does not exercise on a regular basis except when she goes hunting.  14 point review of systems reviewed and otherwise negative except for above.BP 100/80 (BP Location: Left Arm, Patient Position: Sitting, Cuff Size: Large)   Pulse 92   Temp 98 F (36.7 C) (Oral)   Ht 5\' 7"  (1.702 m)   Wt 24 lb 4.8 oz (11 kg)   BMI 3.81 kg/m  Examination HEENT shows very very juvenile cataracts neck was supple no adenopathy thyroid normal no carotid bruits cardiopulmonary exam normal breast exam normal abdominal exam normal  pelvic and rectal not indicated extremities normal skin normal peripheral pulses normal  #1 rheumatoid arthritis....... continue injectable I methotrexate,,,, follow-up by rheumatologist  #2 reflux esophagitis,,,,,,, OTC Nexium  #3 postmenopausal vaginal dryness,,,,,,,,,,,,, continue Premarin cream  #4 migraine headaches,,,,,,,, continue Topamax daily Vicodin Phenergan when necessary  #5 hypothyroidism,,,,,,,, continue Synthroid check labs  #6 hypertension,,,,,,,,, continue lisinopril  #7 hyperlipidemia........ continue Pravachol Monday Wednesday Friday check labs  #8 obese....... again as we have in the past discussed diet exercise and weight loss

## 2015-12-30 NOTE — Progress Notes (Signed)
Pre visit review using our clinic review tool, if applicable. No additional management support is needed unless otherwise documented below in the visit note. 

## 2016-01-06 ENCOUNTER — Other Ambulatory Visit: Payer: Self-pay | Admitting: Family Medicine

## 2016-01-06 DIAGNOSIS — Z8669 Personal history of other diseases of the nervous system and sense organs: Secondary | ICD-10-CM

## 2016-01-23 ENCOUNTER — Other Ambulatory Visit: Payer: Self-pay | Admitting: Family Medicine

## 2016-01-29 DIAGNOSIS — H2512 Age-related nuclear cataract, left eye: Secondary | ICD-10-CM | POA: Diagnosis not present

## 2016-01-29 DIAGNOSIS — H2513 Age-related nuclear cataract, bilateral: Secondary | ICD-10-CM | POA: Diagnosis not present

## 2016-01-29 DIAGNOSIS — H40013 Open angle with borderline findings, low risk, bilateral: Secondary | ICD-10-CM | POA: Diagnosis not present

## 2016-02-06 ENCOUNTER — Encounter: Payer: Self-pay | Admitting: Family Medicine

## 2016-02-06 ENCOUNTER — Ambulatory Visit (INDEPENDENT_AMBULATORY_CARE_PROVIDER_SITE_OTHER): Payer: Commercial Managed Care - HMO | Admitting: Family Medicine

## 2016-02-06 ENCOUNTER — Telehealth: Payer: Self-pay | Admitting: Emergency Medicine

## 2016-02-06 VITALS — BP 130/80 | HR 90 | Temp 98.2°F | Ht 67.0 in | Wt 219.2 lb

## 2016-02-06 DIAGNOSIS — J069 Acute upper respiratory infection, unspecified: Secondary | ICD-10-CM | POA: Diagnosis not present

## 2016-02-06 DIAGNOSIS — B9789 Other viral agents as the cause of diseases classified elsewhere: Secondary | ICD-10-CM | POA: Diagnosis not present

## 2016-02-06 MED ORDER — BENZONATATE 100 MG PO CAPS: 100.0000 mg | ORAL_CAPSULE | Freq: Two times a day (BID) | ORAL | 0 refills | Status: DC | PRN

## 2016-02-06 NOTE — Progress Notes (Signed)
HPI:  URI: -started: 2 days ago -symptoms:clear nasal congestion, sore throat, cough -denies:fever, SOB, NVD, tooth pain, change in body aches, malaise -has tried: nothing -sick contacts/travel/risks: no reported flu, strep or tick exposure -Hx of: allergies  GERD: -reports chronic with hx stricture -reports insurance denied coverage for nexium and she was told she would need to try and fail OTC prilosec -she wants to see if I can let her PCP know she tried prilosec for 2 months and it is not working - still has frequent reflux. Really wants to take nexium. OTC dose of nexium does not work.  ROS: See pertinent positives and negatives per HPI.  No past medical history on file.  No past surgical history on file.  No family history on file.  Social History   Social History  . Marital status: Married    Spouse name: N/A  . Number of children: N/A  . Years of education: N/A   Social History Main Topics  . Smoking status: Light Tobacco Smoker    Types: Cigarettes  . Smokeless tobacco: Never Used     Comment: socially  . Alcohol use Yes     Comment: socially   . Drug use: No  . Sexual activity: Not Asked   Other Topics Concern  . None   Social History Narrative  . None     Current Outpatient Prescriptions:  .  betamethasone dipropionate (DIPROLENE) 0.05 % cream, Apply topically 2 (two) times daily., Disp: 45 g, Rfl: 3 .  Calcium Carbonate-Vitamin D (CALCIUM PLUS VITAMIN D PO), Take by mouth., Disp: , Rfl:  .  cetirizine (ZYRTEC) 10 MG tablet, Take 10 mg by mouth daily., Disp: , Rfl:  .  conjugated estrogens (PREMARIN) vaginal cream, Place 1 Applicatorful vaginally daily., Disp: 42.5 g, Rfl: 12 .  conjugated estrogens (PREMARIN) vaginal cream, PLACE ONE APPLICATORFUL VAGINALLY DAILY, Disp: 60 g, Rfl: 10 .  cyclobenzaprine (FLEXERIL) 10 MG tablet, take 1 tablet by mouth once daily, Disp: 100 tablet, Rfl: 3 .  esomeprazole (NEXIUM) 40 MG capsule, TAKE ONE CAPSULE  DAILY BEFORE BREAKFAST, Disp: 100 capsule, Rfl: 3 .  fish oil-omega-3 fatty acids 1000 MG capsule, Take 1 g by mouth daily., Disp: , Rfl:  .  folic acid (FOLVITE) 1 MG tablet, , Disp: , Rfl:  .  glucosamine-chondroitin 500-400 MG tablet, Take 1 tablet by mouth 3 (three) times daily., Disp: , Rfl:  .  HYDROcodone-acetaminophen (NORCO) 7.5-325 MG tablet, Half to one tab as needed for migraine, Disp: 30 tablet, Rfl: 0 .  levothyroxine (SYNTHROID, LEVOTHROID) 100 MCG tablet, Take 1 tablet (100 mcg total) by mouth daily., Disp: 100 tablet, Rfl: 4 .  lisinopril-hydrochlorothiazide (PRINZIDE,ZESTORETIC) 20-12.5 MG tablet, take 1 tablet by mouth once daily, Disp: 100 tablet, Rfl: 3 .  methotrexate 50 MG/2ML injection, INJECT 0.8ML SUBQ ONCE A WEEK., Disp: 10 vial, Rfl: 0 .  nitrofurantoin, macrocrystal-monohydrate, (MACROBID) 100 MG capsule, Take 1 capsule (100 mg total) by mouth 2 (two) times daily., Disp: 10 capsule, Rfl: 0 .  pilocarpine (SALAGEN) 5 MG tablet, take 1 tablet by mouth three times a day, Disp: 90 tablet, Rfl: 2 .  pravastatin (PRAVACHOL) 20 MG tablet, Take 1 tablet (20 mg total) by mouth daily., Disp: 90 tablet, Rfl: 3 .  predniSONE (DELTASONE) 20 MG tablet, 2 tabs x 3 days, 1 tab x 3 days, 1/2 tab x 3 days, 1/2 tab M,W,F x 2 weeks, Disp: 40 tablet, Rfl: 1 .  promethazine (PHENERGAN) 25 MG  tablet, Take 1 tablet (25 mg total) by mouth every 6 (six) hours as needed., Disp: 30 tablet, Rfl: 2 .  SUMAtriptan (IMITREX) 50 MG tablet, take 1 tablet by mouth every 2 hours if needed, Disp: 6 tablet, Rfl: 6 .  topiramate (TOPAMAX) 100 MG tablet, take 1 tablet by mouth once daily, Disp: 100 tablet, Rfl: 3 .  traMADol (ULTRAM) 50 MG tablet, take 2 tablet by mouth twice a day if needed, Disp: 360 tablet, Rfl: 0 .  TraMADol HCl 100 MG TB24, 1 tablet every morning for chronic pain, Disp: 100 tablet, Rfl: 3 .  benzonatate (TESSALON) 100 MG capsule, Take 1 capsule (100 mg total) by mouth 2 (two) times daily  as needed for cough., Disp: 20 capsule, Rfl: 0  EXAM:  Vitals:   02/06/16 1611  BP: 130/80  Pulse: 90  Temp: 98.2 F (36.8 C)    Body mass index is 34.33 kg/m.  GENERAL: vitals reviewed and listed above, alert, oriented, appears well hydrated and in no acute distress  HEENT: atraumatic, conjunttiva clear, no obvious abnormalities on inspection of external nose and ears, normal appearance of ear canals and TMs, clear nasal congestion, mild post oropharyngeal erythema with PND, no tonsillar edema or exudate, no sinus TTP  NECK: no obvious masses on inspection  LUNGS: clear to auscultation bilaterally, no wheezes, rales or rhonchi, good air movement  CV: HRRR, no peripheral edema  MS: moves all extremities without noticeable abnormality  PSYCH: pleasant and cooperative, no obvious depression or anxiety  ASSESSMENT AND PLAN:  Discussed the following assessment and plan:  Viral upper respiratory illness  -given HPI and exam findings today, a serious infection or illness is unlikely. We discussed potential etiologies, with VURI being most likely, and advised supportive care and monitoring. We discussed treatment side effects, likely course, antibiotic misuse, transmission, and signs of developing a serious illness. -of course, we advised to return or notify a doctor immediately if symptoms worsen or persist or new concerns arise. Advised low threshold for re-evaluation given co-morbidities if worsening or not improving over the next few days.    Patient Instructions  INSTRUCTIONS FOR UPPER RESPIRATORY INFECTION:  -plenty of rest and fluids  -nasal saline wash 2-3 times daily (use prepackaged nasal saline or bottled/distilled water if making your own)   -in the winter time, using a humidifier at night is helpful (please follow cleaning instructions)  -if you are taking a cough medication - use only as directed, may also try a teaspoon of honey to coat the throat and throat  lozenges. I also sent tessalon to the pharmacy.  -for sore throat, salt water gargles can help  -follow up if you have fevers, facial pain, tooth pain, difficulty breathing or are worsening or not improving over the next few days. Can go to the urgent care if you are worsening over the weekend.  Upper Respiratory Infection, Adult An upper respiratory infection (URI) is also known as the common cold. It is often caused by a type of germ (virus). Colds are easily spread (contagious). You can pass it to others by kissing, coughing, sneezing, or drinking out of the same glass. Usually, you get better in 1 to 2  weeks.  However, the cough can last for even longer. HOME CARE   Only take medicine as told by your doctor. Follow instructions provided above.  Drink enough water and fluids to keep your pee (urine) clear or pale yellow.  Get plenty of rest.  Return to  work when your temperature is < 100 for 24 hours or as told by your doctor. You may use a face mask and wash your hands to stop your cold from spreading. GET HELP RIGHT AWAY IF:   After the first few days, you feel you are getting worse.  You have questions about your medicine.  You have chills, shortness of breath, or red spit (mucus).  You have pain in the face for more then 1-2 days, especially when you bend forward.  You have a fever, puffy (swollen) neck, pain when you swallow, or white spots in the back of your throat.  You have a bad headache, ear pain, sinus pain, or chest pain.  You have a high-pitched whistling sound when you breathe in and out (wheezing).  You cough up blood.  You have sore muscles or a stiff neck. MAKE SURE YOU:   Understand these instructions.  Will watch your condition.  Will get help right away if you are not doing well or get worse. Document Released: 06/23/2007 Document Revised: 03/29/2011 Document Reviewed: 04/11/2013 [facility] Patient Information 2015 [place], [place]. This information  is not intended to replace advice given to you by your health care provider. Make sure you discuss any questions you have with your health care provider.    Colin Benton R., DO

## 2016-02-06 NOTE — Telephone Encounter (Signed)
Pt has been taking Nexium 40mg  until insurance stop covering it. Insurance wanted pt to switch to Prilosec to see if it would work, pt stated that she has been taking Prilosec for 2 months and I has not help any. Pt wants know if she can get a prior authorization to start Nexium again.

## 2016-02-06 NOTE — Patient Instructions (Signed)
INSTRUCTIONS FOR UPPER RESPIRATORY INFECTION:  -plenty of rest and fluids  -nasal saline wash 2-3 times daily (use prepackaged nasal saline or bottled/distilled water if making your own)   -in the winter time, using a humidifier at night is helpful (please follow cleaning instructions)  -if you are taking a cough medication - use only as directed, may also try a teaspoon of honey to coat the throat and throat lozenges. I also sent tessalon to the pharmacy.  -for sore throat, salt water gargles can help  -follow up if you have fevers, facial pain, tooth pain, difficulty breathing or are worsening or not improving over the next few days. Can go to the urgent care if you are worsening over the weekend.  Upper Respiratory Infection, Adult An upper respiratory infection (URI) is also known as the common cold. It is often caused by a type of germ (virus). Colds are easily spread (contagious). You can pass it to others by kissing, coughing, sneezing, or drinking out of the same glass. Usually, you get better in 1 to 2  weeks.  However, the cough can last for even longer. HOME CARE   Only take medicine as told by your doctor. Follow instructions provided above.  Drink enough water and fluids to keep your pee (urine) clear or pale yellow.  Get plenty of rest.  Return to work when your temperature is < 100 for 24 hours or as told by your doctor. You may use a face mask and wash your hands to stop your cold from spreading. GET HELP RIGHT AWAY IF:   After the first few days, you feel you are getting worse.  You have questions about your medicine.  You have chills, shortness of breath, or red spit (mucus).  You have pain in the face for more then 1-2 days, especially when you bend forward.  You have a fever, puffy (swollen) neck, pain when you swallow, or white spots in the back of your throat.  You have a bad headache, ear pain, sinus pain, or chest pain.  You have a high-pitched whistling  sound when you breathe in and out (wheezing).  You cough up blood.  You have sore muscles or a stiff neck. MAKE SURE YOU:   Understand these instructions.  Will watch your condition.  Will get help right away if you are not doing well or get worse. Document Released: 06/23/2007 Document Revised: 03/29/2011 Document Reviewed: 04/11/2013 [facility] Patient Information 2015 [place], [place]. This information is not intended to replace advice given to you by your health care provider. Make sure you discuss any questions you have with your health care provider.

## 2016-02-07 DIAGNOSIS — Z8719 Personal history of other diseases of the digestive system: Secondary | ICD-10-CM | POA: Insufficient documentation

## 2016-02-07 DIAGNOSIS — Z8639 Personal history of other endocrine, nutritional and metabolic disease: Secondary | ICD-10-CM | POA: Insufficient documentation

## 2016-02-07 DIAGNOSIS — Z8669 Personal history of other diseases of the nervous system and sense organs: Secondary | ICD-10-CM | POA: Insufficient documentation

## 2016-02-09 DIAGNOSIS — Z79899 Other long term (current) drug therapy: Secondary | ICD-10-CM | POA: Insufficient documentation

## 2016-02-09 NOTE — Progress Notes (Signed)
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   History of Present Illness: Cheryl Knight is a 61 y.o. female with history of rheumatoid arthritis and psoriasis. She states she is doing quite well on injectable methotrexate now. She had a flare about a month ago with increased pain in her hands at the time she was given a short course of prednisone by her PCP which took care of her symptoms. She developed flulike symptoms and having upper respiratory tract infection now. She states is causing generalized arthralgias and myalgias. She's not having any joint pain or joint swelling. His psoriasis is quite well-controlled with methotrexate as well. She continues to have some sicca symptoms for which she's been using pilocarpine and thus been helpful.  Activities of Daily Living:  Patient reports morning stiffness for 15 minutes.   Patient Denies nocturnal pain.  Difficulty dressing/grooming: Denies Difficulty climbing stairs: Reports Difficulty getting out of chair: Reports Difficulty using hands for taps, buttons, cutlery, and/or writing: Denies   Review of Systems  Constitutional: Negative for fatigue, night sweats, weight gain, weight loss and weakness.  HENT: Positive for mouth dryness. Negative for mouth sores, trouble swallowing, trouble swallowing and nose dryness.   Eyes: Positive for dryness. Negative for pain, redness and visual disturbance.  Respiratory: Negative for cough, shortness of breath and difficulty breathing.   Cardiovascular: Negative for chest pain, palpitations, hypertension, irregular heartbeat and swelling in legs/feet.  Gastrointestinal: Negative for blood in stool, constipation and diarrhea.  Endocrine: Negative for increased urination.    Genitourinary: Negative for vaginal dryness.  Musculoskeletal: Positive for arthralgias, joint pain, myalgias, morning stiffness and myalgias. Negative for joint swelling, muscle weakness and muscle tenderness.  Skin: Negative for color change, rash, hair loss, skin tightness, ulcers and sensitivity to sunlight.  Allergic/Immunologic: Negative for susceptible to infections.  Neurological: Negative for dizziness, memory loss and night sweats.  Hematological: Negative for swollen glands.  Psychiatric/Behavioral: Positive for sleep disturbance. Negative for depressed mood. The patient is not nervous/anxious.     PMFS History:  Patient Active Problem List   Diagnosis Date Noted  . High risk medication use 02/09/2016  . History of migraine 02/07/2016  . History of hypothyroidism 02/07/2016  . History of gastroesophageal reflux (GERD) 02/07/2016  . Obese 12/30/2015  . Basal cell carcinoma of chest wall 10/05/2011  . Hypertension 09/01/2010  . Hyperlipidemia 02/27/2008  . Hypothyroidism 02/21/2007  . Migraine 02/21/2007  . VAGINITIS, ATROPHIC 02/21/2007  . Psoriasis 02/21/2007  . Sicca syndrome, unspecified ([place]) 02/21/2007  . GERD 06/29/2006  . Rheumatoid arthritis ([place]) 06/29/2006    No past medical history on file.  No family history on file. No past surgical history on file. Social History   Social History Narrative  . No narrative on file     Objective: Vital Signs: BP 130/62   Pulse 88   Ht _0  (1.702 m)   Wt 214 lb (97.1 kg)   BMI 33.52 kg/m    Physical Exam  Constitutional: She is oriented to person, place, and time. She appears well-developed and well-nourished.  HENT:  Head: Normocephalic and atraumatic.  Eyes: Conjunctivae and EOM are normal.  Neck: Normal range of motion.  Cardiovascular: Normal rate, regular rhythm, normal heart sounds and intact distal pulses.   Pulmonary/Chest: Effort normal and breath sounds normal.  Abdominal: Soft. Bowel sounds are  normal.  Lymphadenopathy:    She has no cervical adenopathy.  Neurological: She is alert and oriented to person, place, and time.  Skin: Skin is warm and dry. Capillary refill takes less than 2 seconds.  Psychiatric: She has a normal mood and affect. Her behavior is normal.  Nursing note and vitals reviewed.    Musculoskeletal Exam: C-spine, thoracic good range of motion she has some discomfort range of motion of her lumbar spine. Shoulder joints, elbow joints, wrist joints good range of motion she has thickening of PIP/DIP joints in her hands but no synovitis. Hip joints are good range of motion she has discomfort with range of motion of her knee joints ankles joints MTPs PIPs with good range of motion with no synovitis.  CDAI Exam: CDAI Homunculus Exam:   Tenderness:  RLE: tibiofemoral LLE: tibiofemoral  Joint Counts:  CDAI Tender Joint count: 2 CDAI Swollen Joint count: 0  Global Assessments:  Patient Global Assessment: 4 Provider Global Assessment: 2  CDAI Calculated Score: 8    Investigation: Findings:  12/30/2015 CBC normal, CMP normal    Imaging: No results found.  Speciality Comments: No specialty comments available.    Procedures:  No procedures performed Allergies: Meperidine hcl and Pentazocine lactate   Assessment / Plan:     Visit Diagnoses: Rheumatoid arthritis involving multiple sites with positive rheumatoid factor (HCC) - Positive RF. She has no synovitis on examination today she seems to be doing quite well on methotrexate subcutaneously. She reports having a flare a month ago which resolved after a short course of prednisone prescribed by her PCP.  Psoriasis: She has no active lesions currently.  Knee joint pain. She has known history of osteoarthritis I did offer x-rays but she declined. I believe weight loss diet and exercise will be helpful as regards to her knee as well.  History of migraine  High risk medication use - Methotrexate 5.7WI  per week, folic acid 2 mg a day -her labs have been stable and next will be due in March and then every 3 months to monitor for drug toxicity. Plan: CMP14+EGFR, CBC with Differential/Platelet, CMP14+EGFR, CBC with Differential/Platelet  Sicca syndrome - Pilcarpine 86m bid has been helpful.  Her other medical problems are as follows.  History of hypothyroidism  History of gastroesophageal reflux (GERD)   Association of heart disease with rheumatoid arthritis was discussed. Need to monitor blood pressure, cholesterol, and to exercise 30-60 minutes on daily basis was discussed. Poor dental hygiene can be a predisposing factor for rheumatoid arthritis. Good dental hygiene was discussed.  Orders: Orders Placed This Encounter  Procedures  . CMP14+EGFR  . CBC with Differential/Platelet   No orders of the defined types were placed in this encounter.   Face-to-face time spent with patient was 30 minutes. 50% of time was spent in counseling and coordination of care.  Follow-Up Instructions: Return in about 5 months (around 07/10/2016) for Rheumatoid arthritis.   SBo Merino MD  Note - This record has been created using DEditor, commissioning  Chart creation errors have been sought, but may not always  have been located. Such creation errors do not reflect on  the standard of medical care.

## 2016-02-10 ENCOUNTER — Ambulatory Visit (INDEPENDENT_AMBULATORY_CARE_PROVIDER_SITE_OTHER): Payer: Commercial Managed Care - HMO | Admitting: Rheumatology

## 2016-02-10 ENCOUNTER — Encounter: Payer: Self-pay | Admitting: Rheumatology

## 2016-02-10 VITALS — BP 130/62 | HR 88 | Ht 67.0 in | Wt 214.0 lb

## 2016-02-10 DIAGNOSIS — M0579 Rheumatoid arthritis with rheumatoid factor of multiple sites without organ or systems involvement: Secondary | ICD-10-CM | POA: Diagnosis not present

## 2016-02-10 DIAGNOSIS — M19072 Primary osteoarthritis, left ankle and foot: Secondary | ICD-10-CM

## 2016-02-10 DIAGNOSIS — Z8719 Personal history of other diseases of the digestive system: Secondary | ICD-10-CM | POA: Diagnosis not present

## 2016-02-10 DIAGNOSIS — M19071 Primary osteoarthritis, right ankle and foot: Secondary | ICD-10-CM

## 2016-02-10 DIAGNOSIS — Z79899 Other long term (current) drug therapy: Secondary | ICD-10-CM | POA: Diagnosis not present

## 2016-02-10 DIAGNOSIS — M3500 Sicca syndrome, unspecified: Secondary | ICD-10-CM

## 2016-02-10 DIAGNOSIS — Z8669 Personal history of other diseases of the nervous system and sense organs: Secondary | ICD-10-CM | POA: Diagnosis not present

## 2016-02-10 DIAGNOSIS — Z8639 Personal history of other endocrine, nutritional and metabolic disease: Secondary | ICD-10-CM

## 2016-02-10 DIAGNOSIS — L409 Psoriasis, unspecified: Secondary | ICD-10-CM

## 2016-02-10 NOTE — Progress Notes (Signed)
Rheumatology Medication Review by a Pharmacist Does the patient feel that his/her medications are working for him/her?  Yes Has the patient been experiencing any side effects to the medications prescribed?  No Does the patient have any problems obtaining medications?  No  Issues to address at subsequent visits: None   Pharmacist comments:  Cheryl Knight is a pleasant 61 yo F who presents for follow up of her rheumatoid arthritis.  She is currently taking methotrexate 0.8 mL weekly and folic acid 2 mg daily.  She had her most recent standing labs on 12/30/15 at which time CBC was normal, BMP showed glucose 118, and hepatic function was normal.  She will be due for standing labs again in march 2018.  Patient denies any questions or concerns regarding her medications at this time.   Elisabeth Most, Pharm.D., BCPS, CPP Clinical Pharmacist Pager: [phone] Phone: [phone] 02/10/2016 8:29 AM

## 2016-02-10 NOTE — Patient Instructions (Addendum)
Standing Labs We placed an order today for your standing lab work.    Please come back and get your standing labs in March 2018 and every 3 months.   We have open lab Monday through Friday from 8:30-11:30 AM and 1:30-4 PM at the office of Dr. Tresa Moore, PA.   The office is located at [address] [place], [place], [place] [address] No appointment is necessary.   Labs are drawn by [facility].  You may receive a bill from [facility] for your lab work.   Association of heart disease with rheumatoid arthritis was discussed. Need to monitor blood pressure, cholesterol, and to exercise 30-60 minutes on daily basis was discussed. Poor dental hygiene can be a predisposing factor for rheumatoid arthritis. Good dental hygiene was discussed.

## 2016-02-23 ENCOUNTER — Telehealth: Payer: Self-pay | Admitting: Family Medicine

## 2016-02-23 ENCOUNTER — Ambulatory Visit: Payer: Commercial Managed Care - HMO | Admitting: Family Medicine

## 2016-02-23 NOTE — Telephone Encounter (Signed)
Error/ltd ° °

## 2016-02-24 ENCOUNTER — Ambulatory Visit (INDEPENDENT_AMBULATORY_CARE_PROVIDER_SITE_OTHER): Payer: Commercial Managed Care - HMO | Admitting: Family Medicine

## 2016-02-24 ENCOUNTER — Encounter: Payer: Self-pay | Admitting: Family Medicine

## 2016-02-24 VITALS — BP 110/80 | HR 99 | Temp 97.9°F | Ht 67.0 in | Wt 217.6 lb

## 2016-02-24 DIAGNOSIS — J111 Influenza due to unidentified influenza virus with other respiratory manifestations: Secondary | ICD-10-CM | POA: Diagnosis not present

## 2016-02-24 MED ORDER — HYDROCODONE-HOMATROPINE 5-1.5 MG/5ML PO SYRP: 5.0000 mL | ORAL_SOLUTION | Freq: Three times a day (TID) | ORAL | 0 refills | Status: DC | PRN

## 2016-02-24 NOTE — Progress Notes (Signed)
Cheryl Knight is a 61 year old married female nonsmoker who comes in with a four-day history fever chills  She had a viral infection 10 days ago. She was seen here by Dr. Ninfa Meeker and prescribed symptomatic therapy. Then on Friday she felt a fever 102 and a cough. Fevers gone but the cough persists. She also has some nasal congestion.  Review of systems negative  BP 110/80 (BP Location: Left Arm, Patient Position: Sitting, Cuff Size: Normal)   Pulse 99   Temp 97.9 F (36.6 C) (Oral)   Ht 5\' 7"  (1.702 m)   Wt 217 lb 9.6 oz (98.7 kg)   BMI 34.08 kg/m  Well-developed well-nourished female no acute distress vital signs stable she's afebrile HEENT were negative neck was supple no adenopathy lungs are clear  Impression viral syndrome flu type with cough........ treat symptomatically

## 2016-02-24 NOTE — Patient Instructions (Signed)
Drink lots of water  Hydromet 1/2-1 teaspoon 3 times daily when necessary for cough  For nasal congestion we recommend the following.......... saline irrigation with a Nettie pot....... one shot of Afrin up each nostril then tilted her head back over the pillow for 10 minutes........ then one shot of steroid nasal spray up each nostril........ do this every night at bedtime until clear.  After 5 nights stop the Afrin but continue the saline and steroid nasal spray until your clear  Return when necessary

## 2016-02-24 NOTE — Progress Notes (Signed)
Pre visit review using our clinic review tool, if applicable. No additional management support is needed unless otherwise documented below in the visit note. 

## 2016-03-20 ENCOUNTER — Other Ambulatory Visit: Payer: Self-pay | Admitting: Rheumatology

## 2016-03-22 NOTE — Telephone Encounter (Signed)
Last Visit: 02/10/16 Next Visit: 07/13/16 Labs: 12/19/15 WNL Left message to remind patient she is due for labs this month.   Okay to refill MTX?

## 2016-03-22 NOTE — Telephone Encounter (Signed)
ok 

## 2016-04-02 DIAGNOSIS — Z79899 Other long term (current) drug therapy: Secondary | ICD-10-CM | POA: Diagnosis not present

## 2016-04-03 LAB — CBC WITH DIFFERENTIAL/PLATELET
Basophils Absolute: 0 10*3/uL (ref 0.0–0.2)
Basos: 0 %
EOS (ABSOLUTE): 0.1 10*3/uL (ref 0.0–0.4)
Eos: 1 %
Hematocrit: 42.3 % (ref 34.0–46.6)
Hemoglobin: 14.3 g/dL (ref 11.1–15.9)
Immature Grans (Abs): 0.1 10*3/uL (ref 0.0–0.1)
Immature Granulocytes: 1 %
Lymphocytes Absolute: 2.1 10*3/uL (ref 0.7–3.1)
Lymphs: 25 %
MCH: 32.1 pg (ref 26.6–33.0)
MCHC: 33.8 g/dL (ref 31.5–35.7)
MCV: 95 fL (ref 79–97)
Monocytes Absolute: 0.4 10*3/uL (ref 0.1–0.9)
Monocytes: 4 %
Neutrophils Absolute: 6 10*3/uL (ref 1.4–7.0)
Neutrophils: 69 %
Platelets: 317 10*3/uL (ref 150–379)
RBC: 4.45 x10E6/uL (ref 3.77–5.28)
RDW: 14.7 % (ref 12.3–15.4)
WBC: 8.6 10*3/uL (ref 3.4–10.8)

## 2016-04-03 LAB — CMP14+EGFR
ALT: 13 IU/L (ref 0–32)
AST: 15 IU/L (ref 0–40)
Albumin/Globulin Ratio: 1.7 (ref 1.2–2.2)
Albumin: 4.5 g/dL (ref 3.6–4.8)
Alkaline Phosphatase: 44 IU/L (ref 39–117)
BUN/Creatinine Ratio: 21 (ref 12–28)
BUN: 18 mg/dL (ref 8–27)
Bilirubin Total: <0.2 mg/dL (ref 0.0–1.2)
CO2: 23 mmol/L (ref 18–29)
Calcium: 10.3 mg/dL (ref 8.7–10.3)
Chloride: 101 mmol/L (ref 96–106)
Creatinine, Ser: 0.87 mg/dL (ref 0.57–1.00)
GFR calc Af Amer: 83 mL/min/{1.73_m2} (ref >59)
GFR calc non Af Amer: 72 mL/min/{1.73_m2} (ref >59)
Globulin, Total: 2.6 g/dL (ref 1.5–4.5)
Glucose: 147 mg/dL — ABNORMAL HIGH (ref 65–99)
Potassium: 4 mmol/L (ref 3.5–5.2)
Sodium: 140 mmol/L (ref 134–144)
Total Protein: 7.1 g/dL (ref 6.0–8.5)

## 2016-04-04 NOTE — Progress Notes (Signed)
Labs normal.

## 2016-04-28 ENCOUNTER — Other Ambulatory Visit: Payer: Self-pay | Admitting: Rheumatology

## 2016-04-29 NOTE — Telephone Encounter (Signed)
ok 

## 2016-04-29 NOTE — Telephone Encounter (Signed)
Last Visit: 02/10/16 Next Visit: 07/13/16  Okay to refill Pilocarpine?

## 2016-05-07 ENCOUNTER — Other Ambulatory Visit: Payer: Self-pay | Admitting: Rheumatology

## 2016-05-07 NOTE — Telephone Encounter (Signed)
02/10/16 last visit  Next visit 07/13/16 Labs WNL 04/02/16 Ok to refill per Dr Estanislado Pandy

## 2016-06-25 ENCOUNTER — Other Ambulatory Visit: Payer: Self-pay | Admitting: Radiology

## 2016-06-25 DIAGNOSIS — Z79899 Other long term (current) drug therapy: Secondary | ICD-10-CM

## 2016-06-26 LAB — CBC WITH DIFFERENTIAL/PLATELET
Basophils Absolute: 0 10*3/uL (ref 0.0–0.2)
Basos: 1 %
EOS (ABSOLUTE): 0.2 10*3/uL (ref 0.0–0.4)
Eos: 3 %
Hematocrit: 43.4 % (ref 34.0–46.6)
Hemoglobin: 13.9 g/dL (ref 11.1–15.9)
Immature Grans (Abs): 0 10*3/uL (ref 0.0–0.1)
Immature Granulocytes: 0 %
Lymphocytes Absolute: 3.1 10*3/uL (ref 0.7–3.1)
Lymphs: 48 %
MCH: 29.9 pg (ref 26.6–33.0)
MCHC: 32 g/dL (ref 31.5–35.7)
MCV: 93 fL (ref 79–97)
Monocytes Absolute: 0.6 10*3/uL (ref 0.1–0.9)
Monocytes: 9 %
Neutrophils Absolute: 2.5 10*3/uL (ref 1.4–7.0)
Neutrophils: 39 %
Platelets: 350 10*3/uL (ref 150–379)
RBC: 4.65 x10E6/uL (ref 3.77–5.28)
RDW: 14.2 % (ref 12.3–15.4)
WBC: 6.3 10*3/uL (ref 3.4–10.8)

## 2016-06-26 LAB — CMP14+EGFR
ALT: 10 IU/L (ref 0–32)
AST: 17 IU/L (ref 0–40)
Albumin/Globulin Ratio: 1.7 (ref 1.2–2.2)
Albumin: 4.5 g/dL (ref 3.6–4.8)
Alkaline Phosphatase: 51 IU/L (ref 39–117)
BUN/Creatinine Ratio: 20 (ref 12–28)
BUN: 15 mg/dL (ref 8–27)
Bilirubin Total: <0.2 mg/dL (ref 0.0–1.2)
CO2: 24 mmol/L (ref 18–29)
Calcium: 10.2 mg/dL (ref 8.7–10.3)
Chloride: 105 mmol/L (ref 96–106)
Creatinine, Ser: 0.74 mg/dL (ref 0.57–1.00)
GFR calc Af Amer: 100 mL/min/{1.73_m2} (ref >59)
GFR calc non Af Amer: 87 mL/min/{1.73_m2} (ref >59)
Globulin, Total: 2.6 g/dL (ref 1.5–4.5)
Glucose: 132 mg/dL — ABNORMAL HIGH (ref 65–99)
Potassium: 3.9 mmol/L (ref 3.5–5.2)
Sodium: 144 mmol/L (ref 134–144)
Total Protein: 7.1 g/dL (ref 6.0–8.5)

## 2016-06-29 NOTE — Progress Notes (Signed)
WNL, Glu elevated

## 2016-07-09 NOTE — Progress Notes (Signed)
Office Visit Note  Patient: Cheryl Knight             Date of Birth: 1955/02/09           MRN: [id number]             PCP: Dorena Cookey, MD Referring: Dorena Cookey, MD Visit Date: 07/13/2016 Occupation: @GUAROCC @    Subjective:  No chief complaint on file.   History of Present Illness: Cheryl Knight is a 61 y.o. female  Last seen in our office on 02/10/2016 for rheumatoid arthritis with +RF.  She has hx of Ps and does not have any flares in past. She is QB:HALPFXTKWIOX 7.3ZH per week, folic acid 2 mg a day . She's supposed to get labs every 3 months  At the last visit with Dr. Estanislado Pandy in January 2018, she reported that she had a flare approximately December for a few days and she was treated by her PCP with a short course of prednisone which helped resolve her flare.  Today, patient complains that her right knee has been bothering her about 2 weeks ago. It was so bad, she would've readily agree to a knee replacement had somebody been available on that particular day. Today her right knee is not doing is poorly. In addition to her right knee she also complains of right shoulder joint pain. She has decreased range of motion stiffness as well as pain. Patient also reports she is also having right heel pain.    Activities of Daily Living:  Patient reports morning stiffness for 15 minutes.   Patient Reports nocturnal pain.  Difficulty dressing/grooming: Reports Difficulty climbing stairs: Reports Difficulty getting out of chair: Reports Difficulty using hands for taps, buttons, cutlery, and/or writing: Denies   Review of Systems  Constitutional: Negative for fatigue.  HENT: Negative for mouth sores and mouth dryness.   Eyes: Negative for dryness.  Respiratory: Negative for shortness of breath.   Gastrointestinal: Negative for constipation and diarrhea.  Musculoskeletal: Negative for myalgias and myalgias.  Skin: Negative for sensitivity to sunlight.    Psychiatric/Behavioral: Negative for decreased concentration and sleep disturbance.    PMFS History:  Patient Active Problem List   Diagnosis Date Noted  . Primary osteoarthritis of both feet 02/10/2016  . High risk medication use 02/09/2016  . History of migraine 02/07/2016  . History of hypothyroidism 02/07/2016  . History of gastroesophageal reflux (GERD) 02/07/2016  . Obese 12/30/2015  . Basal cell carcinoma of chest wall 10/05/2011  . Hypertension 09/01/2010  . Hyperlipidemia 02/27/2008  . Hypothyroidism 02/21/2007  . Migraine 02/21/2007  . VAGINITIS, ATROPHIC 02/21/2007  . Psoriasis 02/21/2007  . Sicca syndrome, unspecified ([place]) 02/21/2007  . GERD 06/29/2006  . Rheumatoid arthritis ([place]) 06/29/2006    History reviewed. No pertinent past medical history.  History reviewed. No pertinent family history. History reviewed. No pertinent surgical history. Social History   Social History Narrative  . No narrative on file     Objective: Vital Signs: BP (!) 141/67   Pulse 100   Resp 14   Ht 5\' 8"  (1.727 m)   Wt 221 lb (100.2 kg)   BMI 33.60 kg/m    Physical Exam  Constitutional: She is oriented to person, place, and time. She appears well-developed and well-nourished.  HENT:  Head: Normocephalic and atraumatic.  Eyes: EOM are normal. Pupils are equal, round, and reactive to light.  Cardiovascular: Normal rate, regular rhythm and normal heart sounds.  Exam reveals no  gallop and no friction rub.   No murmur heard. Pulmonary/Chest: Effort normal and breath sounds normal. She has no wheezes. She has no rales.  Abdominal: Soft. Bowel sounds are normal. She exhibits no distension. There is no tenderness. There is no guarding. No hernia.  Musculoskeletal: Normal range of motion. She exhibits no edema, tenderness or deformity.  Lymphadenopathy:    She has no cervical adenopathy.  Neurological: She is alert and oriented to person, place, and time. Coordination normal.   Skin: Skin is warm and dry. Capillary refill takes less than 2 seconds. No rash noted.  Psychiatric: She has a normal mood and affect. Her behavior is normal.  Nursing note and vitals reviewed.    Musculoskeletal Exam:  Full range of motion of all joints except has trouble raising her right shoulder joint secondary to osteoarthritis versus bursitis; decreased range of motion of right knee at times secondary to osteoarthritis and pain.  Grip strength is equal and strong bilaterally Fiber myalgia tender points are all absent  CDAI Exam: CDAI Homunculus Exam:   Joint Counts:  CDAI Tender Joint count: 0 CDAI Swollen Joint count: 0  Global Assessments:  Patient Global Assessment: 10 Provider Global Assessment: 10  CDAI Calculated Score: 20 Patient states that the pain to her right knee was rated 10 on a scale of 0-10 secondary to severe pain and difficulty in ambulation   Investigation: No additional findings.  Orders Only on 06/25/2016  Component Date Value Ref Range Status  . Glucose 06/25/2016 132* 65 - 99 mg/dL Final  . BUN 06/25/2016 15  8 - 27 mg/dL Final  . Creatinine, Ser 06/25/2016 0.74  0.57 - 1.00 mg/dL Final  . GFR calc non Af Amer 06/25/2016 87  >59 mL/min/1.73 Final  . GFR calc Af Amer 06/25/2016 100  >59 mL/min/1.73 Final  . BUN/Creatinine Ratio 06/25/2016 20  12 - 28 Final  . Sodium 06/25/2016 144  134 - 144 mmol/L Final  . Potassium 06/25/2016 3.9  3.5 - 5.2 mmol/L Final  . Chloride 06/25/2016 105  96 - 106 mmol/L Final  . CO2 06/25/2016 24  18 - 29 mmol/L Final   Comment: **Effective June 28, 2016 Carbon Dioxide, Total**   reference interval will be changing to:              Age                  Female          Female      0 days   - 30 days         82 - 53        16 - 43     31 days   -  1 year         15 - 39        15 - 25      2 years  -  5 years        75 - 54        17 - 23      6 years  - 22 years        38 - 101        19 - 69                >12  years        58 - 55        20 - 54   . Calcium  06/25/2016 10.2  8.7 - 10.3 mg/dL Final  . Total Protein 06/25/2016 7.1  6.0 - 8.5 g/dL Final  . Albumin 06/25/2016 4.5  3.6 - 4.8 g/dL Final  . Globulin, Total 06/25/2016 2.6  1.5 - 4.5 g/dL Final  . Albumin/Globulin Ratio 06/25/2016 1.7  1.2 - 2.2 Final  . Bilirubin Total 06/25/2016 <0.2  0.0 - 1.2 mg/dL Final  . Alkaline Phosphatase 06/25/2016 51  39 - 117 IU/L Final  . AST 06/25/2016 17  0 - 40 IU/L Final  . ALT 06/25/2016 10  0 - 32 IU/L Final  . WBC 06/25/2016 6.3  3.4 - 10.8 x10E3/uL Final  . RBC 06/25/2016 4.65  3.77 - 5.28 x10E6/uL Final  . Hemoglobin 06/25/2016 13.9  11.1 - 15.9 g/dL Final  . Hematocrit 06/25/2016 43.4  34.0 - 46.6 % Final  . MCV 06/25/2016 93  79 - 97 fL Final  . MCH 06/25/2016 29.9  26.6 - 33.0 pg Final  . MCHC 06/25/2016 32.0  31.5 - 35.7 g/dL Final  . RDW 06/25/2016 14.2  12.3 - 15.4 % Final  . Platelets 06/25/2016 350  150 - 379 x10E3/uL Final  . Neutrophils 06/25/2016 39  Not Estab. % Final  . Lymphs 06/25/2016 48  Not Estab. % Final  . Monocytes 06/25/2016 9  Not Estab. % Final  . Eos 06/25/2016 3  Not Estab. % Final  . Basos 06/25/2016 1  Not Estab. % Final  . Neutrophils Absolute 06/25/2016 2.5  1.4 - 7.0 x10E3/uL Final  . Lymphocytes Absolute 06/25/2016 3.1  0.7 - 3.1 x10E3/uL Final  . Monocytes Absolute 06/25/2016 0.6  0.1 - 0.9 x10E3/uL Final  . EOS (ABSOLUTE) 06/25/2016 0.2  0.0 - 0.4 x10E3/uL Final  . Basophils Absolute 06/25/2016 0.0  0.0 - 0.2 x10E3/uL Final  . Immature Granulocytes 06/25/2016 0  Not Estab. % Final  . Immature Grans (Abs) 06/25/2016 0.0  0.0 - 0.1 x10E3/uL Final  Office Visit on 02/10/2016  Component Date Value Ref Range Status  . Glucose 04/02/2016 147* 65 - 99 mg/dL Final  . BUN 04/02/2016 18  8 - 27 mg/dL Final  . Creatinine, Ser 04/02/2016 0.87  0.57 - 1.00 mg/dL Final  . GFR calc non Af Amer 04/02/2016 72  >59 mL/min/1.73 Final  . GFR calc Af Amer 04/02/2016 83  >59  mL/min/1.73 Final  . BUN/Creatinine Ratio 04/02/2016 21  12 - 28 Final  . Sodium 04/02/2016 140  134 - 144 mmol/L Final  . Potassium 04/02/2016 4.0  3.5 - 5.2 mmol/L Final  . Chloride 04/02/2016 101  96 - 106 mmol/L Final  . CO2 04/02/2016 23  18 - 29 mmol/L Final  . Calcium 04/02/2016 10.3  8.7 - 10.3 mg/dL Final  . Total Protein 04/02/2016 7.1  6.0 - 8.5 g/dL Final  . Albumin 04/02/2016 4.5  3.6 - 4.8 g/dL Final  . Globulin, Total 04/02/2016 2.6  1.5 - 4.5 g/dL Final  . Albumin/Globulin Ratio 04/02/2016 1.7  1.2 - 2.2 Final  . Bilirubin Total 04/02/2016 <0.2  0.0 - 1.2 mg/dL Final  . Alkaline Phosphatase 04/02/2016 44  39 - 117 IU/L Final  . AST 04/02/2016 15  0 - 40 IU/L Final  . ALT 04/02/2016 13  0 - 32 IU/L Final  . WBC 04/02/2016 8.6  3.4 - 10.8 x10E3/uL Final  . RBC 04/02/2016 4.45  3.77 - 5.28 x10E6/uL Final  . Hemoglobin 04/02/2016 14.3  11.1 - 15.9 g/dL Final  . Hematocrit 04/02/2016 42.3  34.0 - 46.6 % Final  . MCV 04/02/2016 95  79 - 97 fL Final  . MCH 04/02/2016 32.1  26.6 - 33.0 pg Final  . MCHC 04/02/2016 33.8  31.5 - 35.7 g/dL Final  . RDW 04/02/2016 14.7  12.3 - 15.4 % Final  . Platelets 04/02/2016 317  150 - 379 x10E3/uL Final  . Neutrophils 04/02/2016 69  Not Estab. % Final  . Lymphs 04/02/2016 25  Not Estab. % Final  . Monocytes 04/02/2016 4  Not Estab. % Final  . Eos 04/02/2016 1  Not Estab. % Final  . Basos 04/02/2016 0  Not Estab. % Final  . Neutrophils Absolute 04/02/2016 6.0  1.4 - 7.0 x10E3/uL Final  . Lymphocytes Absolute 04/02/2016 2.1  0.7 - 3.1 x10E3/uL Final  . Monocytes Absolute 04/02/2016 0.4  0.1 - 0.9 x10E3/uL Final  . EOS (ABSOLUTE) 04/02/2016 0.1  0.0 - 0.4 x10E3/uL Final  . Basophils Absolute 04/02/2016 0.0  0.0 - 0.2 x10E3/uL Final  . Immature Granulocytes 04/02/2016 1  Not Estab. % Final  . Immature Grans (Abs) 04/02/2016 0.1  0.0 - 0.1 x10E3/uL Final     Imaging: Xr Foot 2 Views Left  Result Date: 07/13/2016 X-ray left foot 2 views  #1: Mild joint space narrowing at the left first MTP and PIP joint #2: Moderate joint space narrowing at the second third fourth and fifth PIP and DIP #3: No erosions noted #4: Lateral view shows small calcaneal spurring Impression: Moderate osteoarthritis of left foot with no erosions  Xr Foot 2 Views Right  Result Date: 07/13/2016 X-ray right foot 2 views #1: Mild joint space narrowing at the left first MTP and PIP joint #2: Moderate joint space narrowing at the second third fourth and fifth PIP and DIP #3: No erosions noted #4: Lateral view of right foot is normal Impression: Moderate osteoarthritis of right foot with no erosions  Xr Hand 2 View Left  Result Date: 07/13/2016 X-ray of left hand 2 views #1: First MCP with moderate joint space narrowing #2: First second third fourth and fifth digit with PIP joint space narrowing #3: Second third fourth and fifth with. PIP joint space narrowing #4: Mild MCP narrowing of second third fourth and fifth digit with no erosions #5: Normal spacing of intertarsal joints #6: No ulnar styloid erosion #7: Mild narrowing at the radiocarpal joint Impression: Moderate osteoarthritis of the left hand with no erosive changes noted.  Xr Hand 2 View Right  Result Date: 07/13/2016 X-ray of right hand 2 views #1: First MCP with moderate joint space narrowing #2: First second third fourth and fifth digit with PIP joint space narrowing #3: Second third fourth and fifth with. PIP joint space narrowing #4: Mild MCP narrowing of second third fourth and fifth digit with no erosions #5: Normal spacing of intertarsal joints #6: No ulnar styloid erosion #7: Mild narrowing at the radiocarpal joint Impression: Moderate osteoarthritis of the right hand with no erosive changes noted.  Xr Knee 3 View Left  Result Date: 07/13/2016 Left knee x-ray, 3 views #1: Moderate to severe medial compartment narrowing #2: No CPPD #3: Small Osteophyte noted on the tibia on the medial aspect #4:  Moderate patellofemoral joint narrowing #5: Normal patellar alignment of sunrise view Impression and plan #1: Moderate to severe osteoarthritis of the left knee  Xr Knee 3 View Right  Result Date: 07/13/2016 Right knee x-ray, 3 views #1: Moderate to severe medial compartment narrowing #2: No CPPD #3: Small Osteophyte noted  on the tibia on the medial aspect #4: Moderate patellofemoral joint narrowing #5: Normal patellar alignment of sunrise view Impression and plan #1: Moderate to severe osteoarthritis of the right knee    Speciality Comments: No specialty comments available.    Procedures:  No procedures performed Allergies: Meperidine hcl and Pentazocine lactate   Assessment / Plan:     Visit Diagnoses: Rheumatoid arthritis involving multiple sites with positive rheumatoid factor ([place]) - Plan: XR Hand 2 View Left, XR Hand 2 View Right, XR KNEE 3 VIEW LEFT, XR KNEE 3 VIEW RIGHT, XR Foot 2 Views Left, XR Foot 2 Views Right  Psoriasis - No flare  High risk medication use - Methotrexate 0.8 ML's per week; folic acid 2 mg per day  Sicca syndrome - Doing well  Chronic pain of right knee - Finished Hyalgan 5 both knees 08/01/2013; - Plan: XR KNEE 3 VIEW LEFT, XR KNEE 3 VIEW RIGHT  Chronic right shoulder pain  Plantar fasciitis of right foot - recommended [employer] for inserts  Bilateral primary osteoarthritis of knee - Plan: XR KNEE 3 VIEW LEFT, XR KNEE 3 VIEW RIGHT   Plan: #1: Rheumatoid arthritis with positive rheumatoid factor. Doing well with her rheumatoid arthritis. Adequate response with methotrexate  #2: High risk prescription Methotrexate 0.8 ML per week Folic acid 2 mg per day Does not need any medication refill  #3: Sicca symptoms. Doing well.  #4: Psoriasis. No flare.  #5: Right knee pain. History of osteoarthritis of both knees Flare 2 weeks ago. Pain was 10 on a scale of 0-10. Unable to ambulate at time secondary to severe pain. She finished her course  of Hyalgan 5 both knees back on 08/01/2013. At time, she felt like she only got at the most 30-50% relief. As a result, patient was has a 10 to restart the series. After this current flare, patient is eager to redo her Visco supplementation. We will apply for Visco. Hyalgan is preferred but Euflex or Orthovisc will be acceptable if insurance denies Hyalgan.   #6: X-ray of bilateral hands X-ray of bilateral knees X-ray of bilateral feet  #7: Right foot with plantar fasciitis Flared a few weeks ago. Patient will get inserts from [employer]. Patient is doing well enough for we should not be giving her cortisone injection in the right heel at this time  #8: Right shoulder joint pain Consistent with osteoarthritis versus bursitis No indication for cortisone today. Patient is doing well.  #9: Refill Voltaren gel. Patient will use Voltaren gel to the shoulder to the knee and the right heel when necessary. I discussed with the patient how to use it properly  #10: I read the x-rays of bilateral hands, feet, knees and sent a message to Amy to give results to the patient  Orders: Orders Placed This Encounter  Procedures  . XR Hand 2 View Left  . XR Hand 2 View Right  . XR KNEE 3 VIEW LEFT  . XR KNEE 3 VIEW RIGHT  . XR Foot 2 Views Left  . XR Foot 2 Views Right   Meds ordered this encounter  Medications  . diclofenac sodium (VOLTAREN) 1 % GEL    Sig: Voltaren Gel 3 grams to 3 large joints upto TID 3 TUBES with 3 refills    Dispense:  3 Tube    Refill:  3    Voltaren Gel 3 grams to 3 large joints upto TID 3 TUBES with 3 refills    Order Specific  Question:   Supervising Provider    Answer:   Bo Merino [phone]    Face-to-face time spent with patient was 30 minutes. 50% of time was spent in counseling and coordination of care.  Follow-Up Instructions: Return in about 5 months (around 12/13/2016) for RA,mtx 0.8,folic 2mg , rt knee pain, right sj pain, rt plantar  fasciitis,.   Eliezer Lofts, PA-C  I examined and evaluated the patient with Eliezer Lofts PA. The plan of care was discussed as noted above.  Bo Merino, MD Note - This record has been created using Editor, commissioning.  Chart creation errors have been sought, but may not always  have been located. Such creation errors do not reflect on  the standard of medical care.

## 2016-07-13 ENCOUNTER — Ambulatory Visit (INDEPENDENT_AMBULATORY_CARE_PROVIDER_SITE_OTHER): Payer: 59

## 2016-07-13 ENCOUNTER — Encounter: Payer: Self-pay | Admitting: Rheumatology

## 2016-07-13 ENCOUNTER — Ambulatory Visit (INDEPENDENT_AMBULATORY_CARE_PROVIDER_SITE_OTHER): Payer: 59 | Admitting: Rheumatology

## 2016-07-13 VITALS — BP 141/67 | HR 100 | Resp 14 | Ht 68.0 in | Wt 221.0 lb

## 2016-07-13 DIAGNOSIS — M170 Bilateral primary osteoarthritis of knee: Secondary | ICD-10-CM

## 2016-07-13 DIAGNOSIS — M0579 Rheumatoid arthritis with rheumatoid factor of multiple sites without organ or systems involvement: Secondary | ICD-10-CM

## 2016-07-13 DIAGNOSIS — M25561 Pain in right knee: Secondary | ICD-10-CM | POA: Diagnosis not present

## 2016-07-13 DIAGNOSIS — M25511 Pain in right shoulder: Secondary | ICD-10-CM

## 2016-07-13 DIAGNOSIS — G8929 Other chronic pain: Secondary | ICD-10-CM

## 2016-07-13 DIAGNOSIS — Z79899 Other long term (current) drug therapy: Secondary | ICD-10-CM | POA: Diagnosis not present

## 2016-07-13 DIAGNOSIS — M3500 Sicca syndrome, unspecified: Secondary | ICD-10-CM | POA: Diagnosis not present

## 2016-07-13 DIAGNOSIS — L409 Psoriasis, unspecified: Secondary | ICD-10-CM | POA: Diagnosis not present

## 2016-07-13 DIAGNOSIS — M722 Plantar fascial fibromatosis: Secondary | ICD-10-CM

## 2016-07-13 MED ORDER — DICLOFENAC SODIUM 1 % TD GEL: TRANSDERMAL | 3 refills | Status: DC

## 2016-08-03 ENCOUNTER — Other Ambulatory Visit: Payer: Self-pay | Admitting: Rheumatology

## 2016-08-04 NOTE — Telephone Encounter (Signed)
Last Visit: 07/13/16 Next Visit: 12/14/16 Labs: 06/25/16 WNL glucose elevated  Okay to refill per Dr. Estanislado Pandy

## 2016-09-01 ENCOUNTER — Telehealth: Payer: Self-pay | Admitting: Radiology

## 2016-09-01 DIAGNOSIS — Z5181 Encounter for therapeutic drug level monitoring: Secondary | ICD-10-CM

## 2016-09-01 NOTE — Telephone Encounter (Signed)
Refill request received via fax for Tramadol [facility] [place]

## 2016-09-01 NOTE — Telephone Encounter (Signed)
Last Visit: 07/13/16 Next Visit: 12/14/16  08/21/15 UDS , this is due now Called pt to advise  Left message for her to call me back

## 2016-09-02 NOTE — Telephone Encounter (Signed)
Patient returning your call. Please call on cell [phone]

## 2016-09-03 MED ORDER — FOLIC ACID 1 MG PO TABS: 2.0000 mg | ORAL_TABLET | Freq: Every day | ORAL | 3 refills | Status: DC

## 2016-09-03 NOTE — Telephone Encounter (Signed)
Refill request received via fax for Folic Acid  Ok to refill per Dr Estanislado Pandy  Will call patient again about the Tramadol UDS is due

## 2016-09-06 ENCOUNTER — Other Ambulatory Visit: Payer: Self-pay | Admitting: Radiology

## 2016-09-06 ENCOUNTER — Telehealth: Payer: Self-pay | Admitting: Rheumatology

## 2016-09-06 MED ORDER — "BD TB SYRINGE 27G X 1/2"" 1 ML MISC": 3 refills | Status: DC

## 2016-09-06 NOTE — Telephone Encounter (Signed)
Called her again to advise. Left message on number provided.

## 2016-09-06 NOTE — Telephone Encounter (Signed)
Patient returned Amy's call about labs. Please call patient back at work. [phone] ext. [phone]

## 2016-09-07 DIAGNOSIS — Z5181 Encounter for therapeutic drug level monitoring: Secondary | ICD-10-CM | POA: Diagnosis not present

## 2016-09-07 MED ORDER — TRAMADOL HCL 50 MG PO TABS: ORAL_TABLET | ORAL | 0 refills | Status: DC

## 2016-09-07 NOTE — Telephone Encounter (Signed)
Patient is going to update her UDS today. Patient states she is going out of town and would like to know if she could pick up the prescription before going. Patient also has been given a prescription for Hydrocodone for a rescue medication for her migraines which she states you are aware of.   Okay to refill Tramadol?

## 2016-09-07 NOTE — Addendum Note (Signed)
Addended by: Carole Binning on: 09/07/2016 10:29 AM   Modules accepted: Orders

## 2016-09-07 NOTE — Telephone Encounter (Signed)
ok 

## 2016-09-07 NOTE — Telephone Encounter (Signed)
Patient returning Cheryl Knight's call. Patient will be in her office today, but making calls. If she does not answer please leave a message telling her the best time to call back.

## 2016-09-07 NOTE — Telephone Encounter (Signed)
See previous phone note.  

## 2016-09-07 NOTE — Addendum Note (Signed)
Addended by: Carole Binning on: 09/07/2016 01:25 PM   Modules accepted: Orders

## 2016-09-09 LAB — DRUG PROFILE, UR, 9 DRUGS (LABCORP)
Amphetamines, Urine: NEGATIVE ng/mL
Barbiturate Quant, Ur: NEGATIVE ng/mL
Benzodiazepine Quant, Ur: NEGATIVE ng/mL
Cannabinoid Quant, Ur: NEGATIVE ng/mL
Cocaine (Metab.): NEGATIVE ng/mL
Methadone Screen, Urine: NEGATIVE ng/mL
Opiate Quant, Ur: NEGATIVE ng/mL
PCP Quant, Ur: NEGATIVE ng/mL
Propoxyphene: NEGATIVE ng/mL

## 2016-09-09 NOTE — Telephone Encounter (Signed)
ok 

## 2016-09-14 ENCOUNTER — Other Ambulatory Visit: Payer: Self-pay | Admitting: Rheumatology

## 2016-09-14 ENCOUNTER — Telehealth: Payer: Self-pay | Admitting: Rheumatology

## 2016-09-14 NOTE — Telephone Encounter (Signed)
Patient had UA done on Wednesday, and was requesting results. Also needs rx for Tramadol called into [facility] in [place]. [place]

## 2016-09-14 NOTE — Telephone Encounter (Signed)
Pharmacy calling stating they have not received rx for Tramadol. Please call in verbal order. [phone]

## 2016-09-14 NOTE — Telephone Encounter (Signed)
Patient advised we only performed a UDS with her urine and every was okay. Patient also advised prescription was faxed to pharmacy on 09/07/16. Patient states she will contact the pharmacy and call if she has any further problems having it filled.

## 2016-09-14 NOTE — Telephone Encounter (Signed)
Attempted to contact the patient and left message for patient to call the office.  

## 2016-09-15 NOTE — Telephone Encounter (Signed)
Spoke with Josh the Pharmacist and verified that the prescription was not received. Gave verbal authorization. Left message to advise patient.

## 2016-09-21 ENCOUNTER — Other Ambulatory Visit: Payer: Self-pay | Admitting: Rheumatology

## 2016-09-21 DIAGNOSIS — Z1231 Encounter for screening mammogram for malignant neoplasm of breast: Secondary | ICD-10-CM | POA: Diagnosis not present

## 2016-09-21 DIAGNOSIS — Z803 Family history of malignant neoplasm of breast: Secondary | ICD-10-CM | POA: Diagnosis not present

## 2016-09-21 LAB — HM MAMMOGRAPHY

## 2016-09-22 NOTE — Telephone Encounter (Signed)
Last Visit: 07/13/16 Next Visit: 12/14/16  Okay to refill per Dr. Estanislado Pandy

## 2016-09-23 ENCOUNTER — Telehealth: Payer: Self-pay | Admitting: Rheumatology

## 2016-09-23 ENCOUNTER — Encounter: Payer: Self-pay | Admitting: Family Medicine

## 2016-09-23 NOTE — Telephone Encounter (Signed)
Per patient she just realized pharmacy was switched to [facility]. Patient does not want medicine going there. She works,and is not home to sign for meds. Patient wants to get meds thru [facility] in [place]. Patient is completely out of Tramadol but going to get it today hopefully. Please make a note for next refills, per patient.

## 2016-10-04 ENCOUNTER — Telehealth: Payer: Self-pay

## 2016-10-04 ENCOUNTER — Telehealth: Payer: Self-pay | Admitting: Rheumatology

## 2016-10-04 NOTE — Telephone Encounter (Signed)
Received a fax from [facility] requesting a verification for a [employer] enrollment form completed and faxed to [place].  Called to get clarification. Spoke with [place] who states that they are a third party benefits investigor for [employer]. Was an enrollment form completed and faxed to [place] for Hyalgan?  Ivin Booty, Can you reach out to them? Thanks! Phone: [phone]  Demetrios Loll, CPhT 10:56 AM

## 2016-10-04 NOTE — Telephone Encounter (Signed)
Cheryl Knight with [employer] ? Calling requesting status of enrollment form, [employer] prior authorization.

## 2016-10-19 ENCOUNTER — Encounter: Payer: Commercial Managed Care - HMO | Admitting: Family Medicine

## 2016-11-16 ENCOUNTER — Encounter: Payer: Commercial Managed Care - HMO | Admitting: Family Medicine

## 2016-11-30 ENCOUNTER — Other Ambulatory Visit: Payer: Self-pay | Admitting: Rheumatology

## 2016-12-01 NOTE — Telephone Encounter (Addendum)
Last visit: 07/13/16 Next visit: 12/14/16 Labs: 06/25/16 WNL   Left message to advise patient she is due for labs.   Okay to refill 30 day supply per Dr. Estanislado Pandy.

## 2016-12-02 NOTE — Progress Notes (Signed)
Office Visit Note  Patient: Cheryl Knight             Date of Birth: [birth year]           MRN: [id number]             PCP: Dorena Cookey, MD Referring: Dorena Cookey, MD Visit Date: 12/14/2016 Occupation: '@GUAROCC'$ @    Subjective:  Pain knees   History of Present Illness: Cheryl Knight is a 61 y.o. female with a history of rheumatoid arthritis with positive RF who is taking 0.8 ml per week of methotrexate and 2 mg of folic acid daily.  She states she has run out of syringes.  She states she continues to have bilateral knee pain due to osteoarthritis.  She feels she is having increased muscle pain due to favoring her knees.  She states the pain is worse when getting out of chair.  She has stiffness after sitting for long periods of time. She states she continues to have bilateral plantar fasciitis. She would like suggestions on exercises.  She states the pain is worse after sitting for prolonged periods of time and first thing in the morning.  Denies any joint swelling.    Patient reports intermittent lightheadedness, which is being addressed by Dr. Sherren Mocha.   She states her dry eyes have improved with Restasis and Xiidra.   Activities of Daily Living:  Patient reports morning stiffness for 10 minutes.   Patient REPORTS nocturnal pain.  Difficulty dressing/grooming: Reports Difficulty climbing stairs: REPORTS Difficulty getting out of chair: Reports Difficulty using hands for taps, buttons, cutlery, and/or writing: Denies   Review of Systems  Constitutional: Positive for fatigue and weakness. Negative for activity change.  HENT: Positive for mouth dryness. Negative for trouble swallowing and trouble swallowing.   Eyes: Positive for dryness.  Respiratory: Negative for shortness of breath.   Cardiovascular: Negative for swelling in legs/feet.  Gastrointestinal: Positive for constipation and heartburn.  Endocrine: Negative for excessive thirst and excessive hunger.    Genitourinary: Negative for difficulty urinating.  Musculoskeletal: Positive for arthralgias, joint pain, morning stiffness and muscle tenderness. Negative for gait problem.  Skin: Negative for rash.  Neurological: Positive for light-headedness. Negative for dizziness.  Hematological: Negative for bruising/bleeding tendency.  Psychiatric/Behavioral: Positive for sleep disturbance.    PMFS History:  Patient Active Problem List   Diagnosis Date Noted  . Primary osteoarthritis of both feet 02/10/2016  . High risk medication use 02/09/2016  . History of migraine 02/07/2016  . History of hypothyroidism 02/07/2016  . History of gastroesophageal reflux (GERD) 02/07/2016  . Obese 12/30/2015  . Basal cell carcinoma of chest wall 10/05/2011  . Hypertension 09/01/2010  . Hyperlipidemia 02/27/2008  . Hypothyroidism 02/21/2007  . Migraine 02/21/2007  . VAGINITIS, ATROPHIC 02/21/2007  . Psoriasis 02/21/2007  . Sicca syndrome, unspecified ([place]) 02/21/2007  . GERD 06/29/2006  . Rheumatoid arthritis ([place]) 06/29/2006    Past Medical History:  Diagnosis Date  . High cholesterol   . Hypertension   . Irritable bowel   . Rheumatoid arthritis ([place])   . Thyroid disease     History reviewed. No pertinent family history. Past Surgical History:  Procedure Laterality Date  . ABDOMINAL HYSTERECTOMY    . TONSILLECTOMY    . TUBAL LIGATION     Social History   Social History Narrative  . Not on file     Objective: Vital Signs: BP 117/75 (BP Location: Left Arm, Patient Position: Sitting, Cuff Size: Normal)  Pulse (!) 101   Resp 16   Ht '5\' 8"'$  (1.727 m)   Wt 216 lb (98 kg)   BMI 32.84 kg/m    Physical Exam  Constitutional: She is oriented to person, place, and time. She appears well-developed and well-nourished.  HENT:  Head: Normocephalic and atraumatic.  No parotid swelling  Eyes: Conjunctivae and EOM are normal.  Neck: Normal range of motion.  Cardiovascular: Normal rate, regular  rhythm, normal heart sounds and intact distal pulses.  Pulmonary/Chest: Effort normal and breath sounds normal.  Abdominal: Soft. Bowel sounds are normal.  Lymphadenopathy:    She has no cervical adenopathy.  Neurological: She is alert and oriented to person, place, and time.  Skin: Skin is warm and dry. Capillary refill takes less than 2 seconds.  Psychiatric: She has a normal mood and affect. Her behavior is normal.  Nursing note and vitals reviewed.    Musculoskeletal Exam: C-spine, thoracic, and lumbar spine are good ROM.  Shoulder discomfort with full ROM.  Elbows and wrists good ROM. Ganglion cyst present on left dorsal wrist. MCPs, PIPs, DIPs good ROM and no synovitis. Hips, knees, and ankles good ROM. MTPs, PIPs, DIPs good ROM and no synovitis.   Plantar fasciitis bilaterally.    CDAI Exam: CDAI Homunculus Exam:   Joint Counts:  CDAI Tender Joint count: 0 CDAI Swollen Joint count: 0  Global Assessments:  Patient Global Assessment: 3 Provider Global Assessment: 3  CDAI Calculated Score: 6    Investigation: No additional findings. CBC Latest Ref Rng & Units 12/03/2016 06/25/2016 04/02/2016  WBC 3.4 - 10.8 x10E3/uL 6.8 6.3 8.6  Hemoglobin 11.1 - 15.9 g/dL 13.8 13.9 14.3  Hematocrit 34.0 - 46.6 % 40.5 43.4 42.3  Platelets 150 - 379 x10E3/uL 323 350 317   CMP Latest Ref Rng & Units 12/03/2016 06/25/2016 04/02/2016  Glucose 65 - 99 mg/dL 125(H) 132(H) 147(H)  BUN 8 - 27 mg/dL '12 15 18  '$ Creatinine 0.57 - 1.00 mg/dL 0.74 0.74 0.87  Sodium 134 - 144 mmol/L 143 144 140  Potassium 3.5 - 5.2 mmol/L 3.8 3.9 4.0  Chloride 96 - 106 mmol/L 103 105 101  CO2 20 - 29 mmol/L '24 24 23  '$ Calcium 8.7 - 10.3 mg/dL 9.7 10.2 10.3  Total Protein 6.0 - 8.5 g/dL 7.0 7.1 7.1  Total Bilirubin 0.0 - 1.2 mg/dL 0.2 <0.2 <0.2  Alkaline Phos 39 - 117 IU/L 62 51 44  AST 0 - 40 IU/L '16 17 15  '$ ALT 0 - 32 IU/L '11 10 13    '$ Imaging: No results found.  Speciality Comments: No specialty comments  available.    Procedures:  No procedures performed Allergies: Meperidine hcl and Pentazocine lactate   Assessment / Plan:     Visit Diagnoses: Rheumatoid arthritis involving multiple sites with positive rheumatoid factor (HCC) - +RF, no active synovitis on examination today.  Continue of 0.8 ml of methotrexate and 2 mg of folic acid. Last x-rays of hands, feet, and knees were in June 2016.    Psoriasis: No active psoriasis present.  No recent flares.   High risk medication use - Methotrexate 0.0FH per week, folic acid 2 mg a day.  Patients most recent labs were stable.- Plan: CBC with Differential/Platelet, CMP14+EGFR repeated in February 2019 and every 3 months following.   Primary osteoarthritis of both feet: Patient has proper fitting shoes.   Primary osteoarthritis of both knees:  Patient would like to hold off on knee visco injections until 2019.  Knee exercises were discussed with the patient today.  A handout for knee exercises was provided.   Plantar fasciitis of both feet: Discussed exercises.  Handout was given.   Sicca syndrome [facility]): Patient is using Xiidra and Restasis for her dry eyes.     History of migraine  History of gastroesophageal reflux (GERD)  History of hypothyroidism  History of hyperlipidemia    Orders: Orders Placed This Encounter  Procedures  . CBC with Differential/Platelet  . CMP14+EGFR   No orders of the defined types were placed in this encounter.   Face-to-face time spent with patient was 30 minutes.  Greater 50% of time was spent in counseling and coordination of care.  Follow-Up Instructions: No Follow-up on file.   Bo Merino, MD  Note - This record has been created using Editor, commissioning.  Chart creation errors have been sought, but may not always  have been located. Such creation errors do not reflect on  the standard of medical care.

## 2016-12-03 ENCOUNTER — Other Ambulatory Visit: Payer: Self-pay | Admitting: *Deleted

## 2016-12-03 ENCOUNTER — Telehealth (INDEPENDENT_AMBULATORY_CARE_PROVIDER_SITE_OTHER): Payer: Self-pay

## 2016-12-03 DIAGNOSIS — Z79899 Other long term (current) drug therapy: Secondary | ICD-10-CM | POA: Diagnosis not present

## 2016-12-03 MED ORDER — "BD TB SYRINGE 27G X 1/2"" 1 ML MISC": 3 refills | Status: DC

## 2016-12-03 MED ORDER — FOLIC ACID 1 MG PO TABS: 2.0000 mg | ORAL_TABLET | Freq: Every day | ORAL | 3 refills | Status: DC

## 2016-12-03 MED ORDER — TRAMADOL HCL 50 MG PO TABS: ORAL_TABLET | ORAL | 0 refills | Status: DC

## 2016-12-03 NOTE — Telephone Encounter (Signed)
Patient would like for Rx for syringes to be sent to [facility] in [place].  Stated that she has not received the Rx from [facility] and it has been 2 months now.   Would like a Rx refill on Tramadol and Folic Acid sent to [facility] in [place]?  Cb# is [phone].  Please advise.  Thank You.

## 2016-12-03 NOTE — Addendum Note (Signed)
Addended by: Carole Binning on: 12/03/2016 04:01 PM   Modules accepted: Orders

## 2016-12-03 NOTE — Telephone Encounter (Signed)
Last Visit: 07/13/16 Next Visit:12/14/16 UDS: 09/07/16  Narc Agreement: 08/20/15  Okay to refill per Dr. Estanislado Pandy.   Patient will update narcotic agreement at appointment on 12/14/16

## 2016-12-04 LAB — CMP14+EGFR
ALT: 11 IU/L (ref 0–32)
AST: 16 IU/L (ref 0–40)
Albumin/Globulin Ratio: 1.8 (ref 1.2–2.2)
Albumin: 4.5 g/dL (ref 3.6–4.8)
Alkaline Phosphatase: 62 IU/L (ref 39–117)
BUN/Creatinine Ratio: 16 (ref 12–28)
BUN: 12 mg/dL (ref 8–27)
Bilirubin Total: 0.2 mg/dL (ref 0.0–1.2)
CO2: 24 mmol/L (ref 20–29)
Calcium: 9.7 mg/dL (ref 8.7–10.3)
Chloride: 103 mmol/L (ref 96–106)
Creatinine, Ser: 0.74 mg/dL (ref 0.57–1.00)
GFR calc Af Amer: 100 mL/min/{1.73_m2} (ref >59)
GFR calc non Af Amer: 86 mL/min/{1.73_m2} (ref >59)
Globulin, Total: 2.5 g/dL (ref 1.5–4.5)
Glucose: 125 mg/dL — ABNORMAL HIGH (ref 65–99)
Potassium: 3.8 mmol/L (ref 3.5–5.2)
Sodium: 143 mmol/L (ref 134–144)
Total Protein: 7 g/dL (ref 6.0–8.5)

## 2016-12-04 LAB — CBC WITH DIFFERENTIAL/PLATELET
Basophils Absolute: 0.1 10*3/uL (ref 0.0–0.2)
Basos: 1 %
EOS (ABSOLUTE): 0.1 10*3/uL (ref 0.0–0.4)
Eos: 2 %
Hematocrit: 40.5 % (ref 34.0–46.6)
Hemoglobin: 13.8 g/dL (ref 11.1–15.9)
Immature Grans (Abs): 0 10*3/uL (ref 0.0–0.1)
Immature Granulocytes: 1 %
Lymphocytes Absolute: 3.1 10*3/uL (ref 0.7–3.1)
Lymphs: 45 %
MCH: 31.4 pg (ref 26.6–33.0)
MCHC: 34.1 g/dL (ref 31.5–35.7)
MCV: 92 fL (ref 79–97)
Monocytes Absolute: 0.5 10*3/uL (ref 0.1–0.9)
Monocytes: 8 %
Neutrophils Absolute: 2.9 10*3/uL (ref 1.4–7.0)
Neutrophils: 43 %
Platelets: 323 10*3/uL (ref 150–379)
RBC: 4.39 x10E6/uL (ref 3.77–5.28)
RDW: 14.1 % (ref 12.3–15.4)
WBC: 6.8 10*3/uL (ref 3.4–10.8)

## 2016-12-14 ENCOUNTER — Encounter: Payer: Self-pay | Admitting: Rheumatology

## 2016-12-14 ENCOUNTER — Ambulatory Visit: Payer: 59 | Admitting: Rheumatology

## 2016-12-14 VITALS — BP 117/75 | HR 101 | Resp 16 | Ht 68.0 in | Wt 216.0 lb

## 2016-12-14 DIAGNOSIS — M19071 Primary osteoarthritis, right ankle and foot: Secondary | ICD-10-CM | POA: Diagnosis not present

## 2016-12-14 DIAGNOSIS — Z8669 Personal history of other diseases of the nervous system and sense organs: Secondary | ICD-10-CM

## 2016-12-14 DIAGNOSIS — M19072 Primary osteoarthritis, left ankle and foot: Secondary | ICD-10-CM

## 2016-12-14 DIAGNOSIS — Z8639 Personal history of other endocrine, nutritional and metabolic disease: Secondary | ICD-10-CM

## 2016-12-14 DIAGNOSIS — L409 Psoriasis, unspecified: Secondary | ICD-10-CM

## 2016-12-14 DIAGNOSIS — M3500 Sicca syndrome, unspecified: Secondary | ICD-10-CM

## 2016-12-14 DIAGNOSIS — M170 Bilateral primary osteoarthritis of knee: Secondary | ICD-10-CM

## 2016-12-14 DIAGNOSIS — Z8719 Personal history of other diseases of the digestive system: Secondary | ICD-10-CM | POA: Diagnosis not present

## 2016-12-14 DIAGNOSIS — M0579 Rheumatoid arthritis with rheumatoid factor of multiple sites without organ or systems involvement: Secondary | ICD-10-CM | POA: Diagnosis not present

## 2016-12-14 DIAGNOSIS — Z79899 Other long term (current) drug therapy: Secondary | ICD-10-CM

## 2016-12-14 NOTE — Patient Instructions (Addendum)
Knee Exercises Ask your health care provider which exercises are safe for you. Do exercises exactly as told by your health care provider and adjust them as directed. It is normal to feel mild stretching, pulling, tightness, or discomfort as you do these exercises, but you should stop right away if you feel sudden pain or your pain gets worse.Do not begin these exercises until told by your health care provider. STRETCHING AND RANGE OF MOTION EXERCISES These exercises warm up your muscles and joints and improve the movement and flexibility of your knee. These exercises also help to relieve pain, numbness, and tingling. Exercise A: Knee Extension, Prone 1. Lie on your abdomen on a bed. 2. Place your left / right knee just beyond the edge of the surface so your knee is not on the bed. You can put a towel under your left / right thigh just above your knee for comfort. 3. Relax your leg muscles and allow gravity to straighten your knee. You should feel a stretch behind your left / right knee. 4. Hold this position for __________ seconds. 5. Scoot up so your knee is supported between repetitions. Repeat __________ times. Complete this stretch __________ times a day. Exercise B: Knee Flexion, Active  1. Lie on your back with both knees straight. If this causes back discomfort, bend your left / right knee so your foot is flat on the floor. 2. Slowly slide your left / right heel back toward your buttocks until you feel a gentle stretch in the front of your knee or thigh. 3. Hold this position for __________ seconds. 4. Slowly slide your left / right heel back to the starting position. Repeat __________ times. Complete this exercise __________ times a day. Exercise C: Quadriceps, Prone  1. Lie on your abdomen on a firm surface, such as a bed or padded floor. 2. Bend your left / right knee and hold your ankle. If you cannot reach your ankle or pant leg, loop a belt around your foot and grab the belt  instead. 3. Gently pull your heel toward your buttocks. Your knee should not slide out to the side. You should feel a stretch in the front of your thigh and knee. 4. Hold this position for __________ seconds. Repeat __________ times. Complete this stretch __________ times a day. Exercise D: Hamstring, Supine 1. Lie on your back. 2. Loop a belt or towel over the ball of your left / right foot. The ball of your foot is on the walking surface, right under your toes. 3. Straighten your left / right knee and slowly pull on the belt to raise your leg until you feel a gentle stretch behind your knee. ? Do not let your left / right knee bend while you do this. ? Keep your other leg flat on the floor. 4. Hold this position for __________ seconds. Repeat __________ times. Complete this stretch __________ times a day. STRENGTHENING EXERCISES These exercises build strength and endurance in your knee. Endurance is the ability to use your muscles for a long time, even after they get tired. Exercise E: Quadriceps, Isometric  1. Lie on your back with your left / right leg extended and your other knee bent. Put a rolled towel or small pillow under your knee if told by your health care provider. 2. Slowly tense the muscles in the front of your left / right thigh. You should see your kneecap slide up toward your hip or see increased dimpling just above the knee. This   motion will push the back of the knee toward the floor. 3. For __________ seconds, keep the muscle as tight as you can without increasing your pain. 4. Relax the muscles slowly and completely. Repeat __________ times. Complete this exercise __________ times a day. Exercise F: Straight Leg Raises - Quadriceps 1. Lie on your back with your left / right leg extended and your other knee bent. 2. Tense the muscles in the front of your left / right thigh. You should see your kneecap slide up or see increased dimpling just above the knee. Your thigh may  even shake a bit. 3. Keep these muscles tight as you raise your leg 4-6 inches (10-15 cm) off the floor. Do not let your knee bend. 4. Hold this position for __________ seconds. 5. Keep these muscles tense as you lower your leg. 6. Relax your muscles slowly and completely after each repetition. Repeat __________ times. Complete this exercise __________ times a day. Exercise G: Hamstring, Isometric 1. Lie on your back on a firm surface. 2. Bend your left / right knee approximately __________ degrees. 3. Dig your left / right heel into the surface as if you are trying to pull it toward your buttocks. Tighten the muscles in the back of your thighs to dig as hard as you can without increasing any pain. 4. Hold this position for __________ seconds. 5. Release the tension gradually and allow your muscles to relax completely for __________ seconds after each repetition. Repeat __________ times. Complete this exercise __________ times a day. Exercise H: Hamstring Curls  If told by your health care provider, do this exercise while wearing ankle weights. Begin with __________ weights. Then increase the weight by 1 lb (0.5 kg) increments. Do not wear ankle weights that are more than __________. 1. Lie on your abdomen with your legs straight. 2. Bend your left / right knee as far as you can without feeling pain. Keep your hips flat against the floor. 3. Hold this position for __________ seconds. 4. Slowly lower your leg to the starting position.  Repeat __________ times. Complete this exercise __________ times a day. Exercise I: Squats (Quadriceps) 1. Stand in front of a table, with your feet and knees pointing straight ahead. You may rest your hands on the table for balance but not for support. 2. Slowly bend your knees and lower your hips like you are going to sit in a chair. ? Keep your weight over your heels, not over your toes. ? Keep your lower legs upright so they are parallel with the table  legs. ? Do not let your hips go lower than your knees. ? Do not bend lower than told by your health care provider. ? If your knee pain increases, do not bend as low. 3. Hold the squat position for __________ seconds. 4. Slowly push with your legs to return to standing. Do not use your hands to pull yourself to standing. Repeat __________ times. Complete this exercise __________ times a day. Exercise J: Wall Slides (Quadriceps)  1. Lean your back against a smooth wall or door while you walk your feet out 18-24 inches (46-61 cm) from it. 2. Place your feet hip-width apart. 3. Slowly slide down the wall or door until your knees bend __________ degrees. Keep your knees over your heels, not over your toes. Keep your knees in line with your hips. 4. Hold for __________ seconds. Repeat __________ times. Complete this exercise __________ times a day. Exercise K: Straight Leg Raises -   Hip Abductors 1. Lie on your side with your left / right leg in the top position. Lie so your head, shoulder, knee, and hip line up. You may bend your bottom knee to help you keep your balance. 2. Roll your hips slightly forward so your hips are stacked directly over each other and your left / right knee is facing forward. 3. Leading with your heel, lift your top leg 4-6 inches (10-15 cm). You should feel the muscles in your outer hip lifting. ? Do not let your foot drift forward. ? Do not let your knee roll toward the ceiling. 4. Hold this position for __________ seconds. 5. Slowly return your leg to the starting position. 6. Let your muscles relax completely after each repetition. Repeat __________ times. Complete this exercise __________ times a day. Exercise L: Straight Leg Raises - Hip Extensors 1. Lie on your abdomen on a firm surface. You can put a pillow under your hips if that is more comfortable. 2. Tense the muscles in your buttocks and lift your left / right leg about 4-6 inches (10-15 cm). Keep your knee  straight as you lift your leg. 3. Hold this position for __________ seconds. 4. Slowly lower your leg to the starting position. 5. Let your leg relax completely after each repetition. Repeat __________ times. Complete this exercise __________ times a day. This information is not intended to replace advice given to you by your health care provider. Make sure you discuss any questions you have with your health care provider. Document Released: 11/18/2004 Document Revised: 09/29/2015 Document Reviewed: 11/10/2014 Elsevier Interactive Patient Education  2018 [place]. Plantar Fasciitis Rehab Ask your health care provider which exercises are safe for you. Do exercises exactly as told by your health care provider and adjust them as directed. It is normal to feel mild stretching, pulling, tightness, or discomfort as you do these exercises, but you should stop right away if you feel sudden pain or your pain gets worse. Do not begin these exercises until told by your health care provider. Stretching and range of motion exercises These exercises warm up your muscles and joints and improve the movement and flexibility of your foot. These exercises also help to relieve pain. Exercise A: Plantar fascia stretch  1. Sit with your left / right leg crossed over your opposite knee. 2. Hold your heel with one hand with that thumb near your arch. With your other hand, hold your toes and gently pull them back toward the top of your foot. You should feel a stretch on the bottom of your toes or your foot or both. 3. Hold this stretch for__________ seconds. 4. Slowly release your toes and return to the starting position. Repeat __________ times. Complete this exercise __________ times a day. Exercise B: Gastroc, standing  1. Stand with your hands against a wall. 2. Extend your left / right leg behind you, and bend your front knee slightly. 3. Keeping your heels on the floor and keeping your back knee straight,  shift your weight toward the wall without arching your back. You should feel a gentle stretch in your left / right calf. 4. Hold this position for __________ seconds. Repeat __________ times. Complete this exercise __________ times a day. Exercise C: Soleus, standing 1. Stand with your hands against a wall. 2. Extend your left / right leg behind you, and bend your front knee slightly. 3. Keeping your heels on the floor, bend your back knee and slightly shift your weight over  the back leg. You should feel a gentle stretch deep in your calf. 4. Hold this position for __________ seconds. Repeat __________ times. Complete this exercise __________ times a day. Exercise D: Gastrocsoleus, standing 1. Stand with the ball of your left / right foot on a step. The ball of your foot is on the walking surface, right under your toes. 2. Keep your other foot firmly on the same step. 3. Hold onto the wall or a railing for balance. 4. Slowly lift your other foot, allowing your body weight to press your heel down over the edge of the step. You should feel a stretch in your left / right calf. 5. Hold this position for __________ seconds. 6. Return both feet to the step. 7. Repeat this exercise with a slight bend in your left / right knee. Repeat __________ times with your left / right knee straight and __________ times with your left / right knee bent. Complete this exercise __________ times a day. Balance exercise This exercise builds your balance and strength control of your arch to help take pressure off your plantar fascia. Exercise E: Single leg stand 1. Without shoes, stand near a railing or in a doorway. You may hold onto the railing or door frame as needed. 2. Stand on your left / right foot. Keep your big toe down on the floor and try to keep your arch lifted. Do not let your foot roll inward. 3. Hold this position for __________ seconds. 4. If this exercise is too easy, you can try it with your eyes  closed or while standing on a pillow. Repeat __________ times. Complete this exercise __________ times a day. This information is not intended to replace advice given to you by your health care provider. Make sure you discuss any questions you have with your health care provider. Document Released: 01/04/2005 Document Revised: 09/09/2015 Document Reviewed: 11/18/2014 Elsevier Interactive Patient Education  2018 [place] We placed an order today for your standing lab work.    Please come back and get your standing labs in February and every 3 months following  We have open lab Monday through Friday from 8:30-11:30 AM and 1:30-4 PM at the office of Dr. Bo Merino.   The office is located at [address] [place], [place], [place] [address] No appointment is necessary.   Labs are drawn by [facility].  You may receive a bill from [facility] for your lab work. If you have any questions regarding directions or hours of operation,  please call [phone].

## 2016-12-28 ENCOUNTER — Encounter: Payer: Self-pay | Admitting: Family Medicine

## 2016-12-30 ENCOUNTER — Other Ambulatory Visit: Payer: Self-pay | Admitting: Family Medicine

## 2017-01-03 ENCOUNTER — Other Ambulatory Visit: Payer: Self-pay | Admitting: Family Medicine

## 2017-01-03 DIAGNOSIS — Z8669 Personal history of other diseases of the nervous system and sense organs: Secondary | ICD-10-CM

## 2017-01-12 ENCOUNTER — Other Ambulatory Visit: Payer: Self-pay | Admitting: Family Medicine

## 2017-01-12 DIAGNOSIS — I10 Essential (primary) hypertension: Secondary | ICD-10-CM

## 2017-01-23 ENCOUNTER — Other Ambulatory Visit: Payer: Self-pay | Admitting: Rheumatology

## 2017-01-24 NOTE — Telephone Encounter (Signed)
Last Visit: 12/14/16 Next Visit: 05/17/16 Labs: 12/03/16 Elevated glucose all other labs WNL  Okay to refill per Dr. Estanislado Pandy

## 2017-02-08 ENCOUNTER — Encounter: Payer: Self-pay | Admitting: Family Medicine

## 2017-02-08 ENCOUNTER — Ambulatory Visit (INDEPENDENT_AMBULATORY_CARE_PROVIDER_SITE_OTHER): Payer: 59 | Admitting: Family Medicine

## 2017-02-08 VITALS — BP 130/84 | HR 100 | Temp 98.4°F | Ht 68.0 in | Wt 218.0 lb

## 2017-02-08 DIAGNOSIS — E6609 Other obesity due to excess calories: Secondary | ICD-10-CM | POA: Diagnosis not present

## 2017-02-08 DIAGNOSIS — I10 Essential (primary) hypertension: Secondary | ICD-10-CM

## 2017-02-08 DIAGNOSIS — G43C0 Periodic headache syndromes in child or adult, not intractable: Secondary | ICD-10-CM | POA: Diagnosis not present

## 2017-02-08 DIAGNOSIS — E038 Other specified hypothyroidism: Secondary | ICD-10-CM | POA: Diagnosis not present

## 2017-02-08 DIAGNOSIS — M0579 Rheumatoid arthritis with rheumatoid factor of multiple sites without organ or systems involvement: Secondary | ICD-10-CM | POA: Diagnosis not present

## 2017-02-08 DIAGNOSIS — K2100 Gastro-esophageal reflux disease with esophagitis, without bleeding: Secondary | ICD-10-CM

## 2017-02-08 DIAGNOSIS — Z6830 Body mass index (BMI) 30.0-30.9, adult: Secondary | ICD-10-CM

## 2017-02-08 DIAGNOSIS — Z8669 Personal history of other diseases of the nervous system and sense organs: Secondary | ICD-10-CM | POA: Diagnosis not present

## 2017-02-08 DIAGNOSIS — G43709 Chronic migraine without aura, not intractable, without status migrainosus: Secondary | ICD-10-CM

## 2017-02-08 DIAGNOSIS — E785 Hyperlipidemia, unspecified: Secondary | ICD-10-CM | POA: Diagnosis not present

## 2017-02-08 DIAGNOSIS — Z0000 Encounter for general adult medical examination without abnormal findings: Secondary | ICD-10-CM | POA: Diagnosis not present

## 2017-02-08 DIAGNOSIS — N952 Postmenopausal atrophic vaginitis: Secondary | ICD-10-CM | POA: Diagnosis not present

## 2017-02-08 LAB — POCT URINALYSIS DIPSTICK
Bilirubin, UA: NEGATIVE
Glucose, UA: NEGATIVE
Ketones, UA: NEGATIVE
Nitrite, UA: NEGATIVE
Protein, UA: NEGATIVE
Spec Grav, UA: 1.02 (ref 1.010–1.025)
Urobilinogen, UA: 0.2 E.U./dL
pH, UA: 6.5 (ref 5.0–8.0)

## 2017-02-08 MED ORDER — PRAVASTATIN SODIUM 20 MG PO TABS: 20.0000 mg | ORAL_TABLET | Freq: Every day | ORAL | 3 refills | Status: DC

## 2017-02-08 MED ORDER — HYDROCODONE-ACETAMINOPHEN 7.5-325 MG PO TABS: ORAL_TABLET | ORAL | 0 refills | Status: DC

## 2017-02-08 MED ORDER — ESTROGENS, CONJUGATED 0.625 MG/GM VA CREA: 1.0000 | TOPICAL_CREAM | Freq: Every day | VAGINAL | 12 refills | Status: DC

## 2017-02-08 MED ORDER — LEVOTHYROXINE SODIUM 100 MCG PO TABS: 100.0000 ug | ORAL_TABLET | Freq: Every day | ORAL | 4 refills | Status: DC

## 2017-02-08 MED ORDER — PREDNISONE 20 MG PO TABS: ORAL_TABLET | ORAL | 1 refills | Status: DC

## 2017-02-08 MED ORDER — SUMATRIPTAN SUCCINATE 50 MG PO TABS: ORAL_TABLET | ORAL | 6 refills | Status: DC

## 2017-02-08 MED ORDER — TOPIRAMATE 100 MG PO TABS: 100.0000 mg | ORAL_TABLET | Freq: Every day | ORAL | 4 refills | Status: DC

## 2017-02-08 MED ORDER — CYCLOBENZAPRINE HCL 10 MG PO TABS: 10.0000 mg | ORAL_TABLET | Freq: Every day | ORAL | 4 refills | Status: DC

## 2017-02-08 MED ORDER — LISINOPRIL-HYDROCHLOROTHIAZIDE 20-12.5 MG PO TABS: 1.0000 | ORAL_TABLET | Freq: Every day | ORAL | 4 refills | Status: DC

## 2017-02-08 MED ORDER — ESOMEPRAZOLE MAGNESIUM 40 MG PO CPDR: DELAYED_RELEASE_CAPSULE | ORAL | 4 refills | Status: DC

## 2017-02-08 NOTE — Progress Notes (Signed)
Cheryl Knight is a 62 year old married female nonsmoker who comes in today for physical examination because of multiple healthcare issues  She has a history of rheumatoid arthritis and is on methotrexate the injection and is doing well. She has an occasional flare for which she'll take a short course of prednisone. Last 12 months she's not had to take any prednisone.  She has a history of underlying hypertension on Zestoretic 20-12 0.5 daily BP at home runs 120/80  She has chronic pain syndrome because of arthritis. She takes tramadol 100 mg daily.  She has Synthroid 100 g daily because of a history of hypothyroidism  She uses Premarin vaginal cream for postmenopausal vaginal dryness. She's had her uterus and ovaries removed for nonmalignant reasons.  She has a history of reflux esophagitis and IBS. She takes Nexium 40 mg twice a day  She takes Zestoretic for allergic rhinitis and Flexeril 10 mg at bedtime for chronic muscle pain.  She takes Pravachol 20 mg Monday Wednesday Friday for hyperlipidemia  Her migraine headaches uses Topamax 100 mg daily and when necessary Imitrex.  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 She's married lives here in [place] and is an avid Retail banker  Vaccinations she declines a flu shot agrees to have a pneumonia vaccine and agrees to get the shingles vaccine.  She gets routine eye care, dental care, BSE monthly, mammography September 2018 up-to-date. Last colonoscopy 2010.  She's had a TAH and BSO for nonmalignant reasons she had prolapse.  14 point review of systems reviewed and otherwise negative  EKG was done because a history of hypertension and hyperlipidemia. EKG was normal and unchanged  BP 130/84 (BP Location: Left Arm, Patient Position: Sitting, Cuff Size: Normal)   Pulse 100   Temp 98.4 F (36.9 C) (Oral)   Ht 5\' 8"  (1.727 m)   Wt 218 lb (98.9 kg)   BMI 33.15 kg/m  General she's well-developed well-nourished overweight female no  acute distress.  Examination HEENT was negative except for early bilateral cataracts neck was supple thyroid is not enlarged no carotid bruits cardiopulmonary exam normal breast exam normal abdominal exam normal pelvic exam and rectal not indicated therefore deferred extremities normal skin normal peripheral pulses normal. She does have an irritated SK on her left shoulder. She has seen a dermatologist. Offered to remove it here if she wants.  #1 rheumatoid arthritis continue current meds check labs,,,,,,,,,,,,, she's followed by Dr. Angelica Chessman rheumatology  #2 hypertension at goal.......... continue current therapy  #3 hyperlipidemia........... continue current meds check labs  #4 obese.......... again discussed diet exercise and weight loss  #5 migraine headaches............ under good control with current therapy  #6 hypothyroidism.............. continue Synthroid  #7 postmenopausal vaginal dryness........... continue Premarin when necessary  #8 IBS......... continue Nexium increased dose to twice a day per Dr. Noel Journey  suggestion.

## 2017-02-08 NOTE — Patient Instructions (Signed)
Carbohydrate free diet walk 30 minutes daily............ consider joining the [employer] in doing the exercise bike since its difficult to walk with your arthritis  Labs today........... I will call you if there is anything abnormal  Call your insurance company funny awaiting get the shingles vaccine  Return in one year for general physical exam sooner if any problems

## 2017-02-09 LAB — CBC WITH DIFFERENTIAL/PLATELET
Basophils Absolute: 0 10*3/uL (ref 0.0–0.1)
Basophils Relative: 0.5 % (ref 0.0–3.0)
Eosinophils Absolute: 0.2 10*3/uL (ref 0.0–0.7)
Eosinophils Relative: 2.7 % (ref 0.0–5.0)
HCT: 43.1 % (ref 36.0–46.0)
Hemoglobin: 14.1 g/dL (ref 12.0–15.0)
Lymphocytes Relative: 44.8 % (ref 12.0–46.0)
Lymphs Abs: 3 10*3/uL (ref 0.7–4.0)
MCHC: 32.8 g/dL (ref 30.0–36.0)
MCV: 96.8 fl (ref 78.0–100.0)
Monocytes Absolute: 0.7 10*3/uL (ref 0.1–1.0)
Monocytes Relative: 9.7 % (ref 3.0–12.0)
Neutro Abs: 2.9 10*3/uL (ref 1.4–7.7)
Neutrophils Relative %: 42.3 % — ABNORMAL LOW (ref 43.0–77.0)
Platelets: 317 10*3/uL (ref 150.0–400.0)
RBC: 4.45 Mil/uL (ref 3.87–5.11)
RDW: 14.1 % (ref 11.5–15.5)
WBC: 6.8 10*3/uL (ref 4.0–10.5)

## 2017-02-09 LAB — TSH: TSH: 2.1 u[IU]/mL (ref 0.35–4.50)

## 2017-02-09 LAB — BASIC METABOLIC PANEL
BUN: 13 mg/dL (ref 6–23)
CO2: 28 mEq/L (ref 19–32)
Calcium: 9.8 mg/dL (ref 8.4–10.5)
Chloride: 105 mEq/L (ref 96–112)
Creatinine, Ser: 0.73 mg/dL (ref 0.40–1.20)
GFR: 85.46 mL/min (ref >60.00)
Glucose, Bld: 98 mg/dL (ref 70–99)
Potassium: 4 mEq/L (ref 3.5–5.1)
Sodium: 141 mEq/L (ref 135–145)

## 2017-02-09 LAB — LIPID PANEL
Cholesterol: 192 mg/dL (ref 0–200)
HDL: 45.4 mg/dL (ref >39.00)
LDL Cholesterol: 119 mg/dL — ABNORMAL HIGH (ref 0–99)
NonHDL: 146.17
Total CHOL/HDL Ratio: 4
Triglycerides: 137 mg/dL (ref 0.0–149.0)
VLDL: 27.4 mg/dL (ref 0.0–40.0)

## 2017-02-09 LAB — HEPATIC FUNCTION PANEL
ALT: 9 U/L (ref 0–35)
AST: 14 U/L (ref 0–37)
Albumin: 4.4 g/dL (ref 3.5–5.2)
Alkaline Phosphatase: 56 U/L (ref 39–117)
Bilirubin, Direct: 0.1 mg/dL (ref 0.0–0.3)
Total Bilirubin: 0.3 mg/dL (ref 0.2–1.2)
Total Protein: 6.9 g/dL (ref 6.0–8.3)

## 2017-02-09 LAB — HEMOGLOBIN A1C: Hgb A1c MFr Bld: 6.9 % — ABNORMAL HIGH (ref 4.6–6.5)

## 2017-02-11 ENCOUNTER — Telehealth: Payer: Self-pay | Admitting: *Deleted

## 2017-02-16 ENCOUNTER — Other Ambulatory Visit: Payer: Self-pay | Admitting: Rheumatology

## 2017-02-17 NOTE — Telephone Encounter (Signed)
Last Visit: 12/14/16 Next Visit: 05/17/16  Okay to refill per Dr. Estanislado Pandy

## 2017-02-22 NOTE — Telephone Encounter (Signed)
Error

## 2017-02-25 ENCOUNTER — Telehealth: Payer: Self-pay | Admitting: Family Medicine

## 2017-02-25 ENCOUNTER — Other Ambulatory Visit: Payer: Self-pay

## 2017-02-25 DIAGNOSIS — Z79899 Other long term (current) drug therapy: Secondary | ICD-10-CM | POA: Diagnosis not present

## 2017-02-25 DIAGNOSIS — R7309 Other abnormal glucose: Secondary | ICD-10-CM

## 2017-02-25 NOTE — Telephone Encounter (Signed)
Spoke with Cheryl Knight voiced understanding that Dr Sherren Mocha recommends for her to schedule a fasting A1C repeat, Cheryl Knight has been scheduled for 05/31/2017 at 8 am.

## 2017-02-25 NOTE — Telephone Encounter (Signed)
Pt  Called  Back  For   Lab  Results   Which were  Done  02/08/2017    CRM   [id number]    Pt  Notified of results  She  Was  Notified  Of  Elevated   Blood  Sugar  And  A1C     She  Was  Advised  Of  Plan of care in regards to  Diet and  Exercise.   She  Was  Advised that  She needed fasting blood  Sugar  And A1C   In May    Not  Scheduled   As  There  Are  No  Future  Orders  Yet  Placed

## 2017-02-25 NOTE — Telephone Encounter (Signed)
Order for A1C has been placed

## 2017-02-26 LAB — CMP14+EGFR
ALT: 12 IU/L (ref 0–32)
AST: 17 IU/L (ref 0–40)
Albumin/Globulin Ratio: 2 (ref 1.2–2.2)
Albumin: 4.8 g/dL (ref 3.6–4.8)
Alkaline Phosphatase: 70 IU/L (ref 39–117)
BUN/Creatinine Ratio: 17 (ref 12–28)
BUN: 13 mg/dL (ref 8–27)
Bilirubin Total: <0.2 mg/dL (ref 0.0–1.2)
CO2: 24 mmol/L (ref 20–29)
Calcium: 10.1 mg/dL (ref 8.7–10.3)
Chloride: 102 mmol/L (ref 96–106)
Creatinine, Ser: 0.77 mg/dL (ref 0.57–1.00)
GFR calc Af Amer: 95 mL/min/{1.73_m2} (ref >59)
GFR calc non Af Amer: 82 mL/min/{1.73_m2} (ref >59)
Globulin, Total: 2.4 g/dL (ref 1.5–4.5)
Glucose: 120 mg/dL — ABNORMAL HIGH (ref 65–99)
Potassium: 3.8 mmol/L (ref 3.5–5.2)
Sodium: 142 mmol/L (ref 134–144)
Total Protein: 7.2 g/dL (ref 6.0–8.5)

## 2017-02-26 LAB — CBC WITH DIFFERENTIAL/PLATELET
Basophils Absolute: 0.1 10*3/uL (ref 0.0–0.2)
Basos: 1 %
EOS (ABSOLUTE): 0.2 10*3/uL (ref 0.0–0.4)
Eos: 2 %
Hematocrit: 42.7 % (ref 34.0–46.6)
Hemoglobin: 14 g/dL (ref 11.1–15.9)
Immature Grans (Abs): 0.1 10*3/uL (ref 0.0–0.1)
Immature Granulocytes: 1 %
Lymphocytes Absolute: 3.6 10*3/uL — ABNORMAL HIGH (ref 0.7–3.1)
Lymphs: 44 %
MCH: 31.3 pg (ref 26.6–33.0)
MCHC: 32.8 g/dL (ref 31.5–35.7)
MCV: 96 fL (ref 79–97)
Monocytes Absolute: 0.7 10*3/uL (ref 0.1–0.9)
Monocytes: 8 %
Neutrophils Absolute: 3.7 10*3/uL (ref 1.4–7.0)
Neutrophils: 44 %
Platelets: 335 10*3/uL (ref 150–379)
RBC: 4.47 x10E6/uL (ref 3.77–5.28)
RDW: 14.1 % (ref 12.3–15.4)
WBC: 8.2 10*3/uL (ref 3.4–10.8)

## 2017-03-04 ENCOUNTER — Telehealth: Payer: Self-pay | Admitting: Family Medicine

## 2017-03-04 DIAGNOSIS — Z8669 Personal history of other diseases of the nervous system and sense organs: Secondary | ICD-10-CM

## 2017-03-04 NOTE — Telephone Encounter (Signed)
Spoke with pt stated that she is fine waiting for  Monday for Dr Sherren Mocha to rewrite another Rx for Hydrocodone with the correct directions. Pt voiced understanding that she will be notified when Rx is ready for pick up.

## 2017-03-04 NOTE — Telephone Encounter (Signed)
Pharmacy call to say the below does not give instrutions on how often to take the below med and need a call back with this info   [facility] aide [place]    [phone]     HYDROcodone-acetaminophen (NORCO) 7.5-325 MG tablet

## 2017-03-04 NOTE — Telephone Encounter (Signed)
Attempted to reach both pt and pharmacy but no success, left a message for pt to return my call in the office inregards to clarification on the Hydrocodone medication

## 2017-03-04 NOTE — Telephone Encounter (Signed)
Copied from [place]. Topic: General - Other >> Feb 11, 2017 10:10 AM Carolyn Stare wrote:   Pharmacy call to say the below does not give instrutions on how often to take the below med and need a call back with this info   [facility] aide [place]    [phone]     HYDROcodone-acetaminophen (NORCO) 7.5-325 MG tablet

## 2017-03-07 NOTE — Telephone Encounter (Signed)
Please Advise

## 2017-03-15 MED ORDER — SUMATRIPTAN SUCCINATE 50 MG PO TABS: ORAL_TABLET | ORAL | 6 refills | Status: DC

## 2017-03-15 NOTE — Telephone Encounter (Signed)
Copied from [place]. Topic: Quick Communication - Rx Refill/Question >> Mar 15, 2017  2:59 PM Carolyn Stare wrote: Medication  SUMAtriptan (IMITREX) 50 MG tablet     pt will need a new RX sent in due to [place] taking over [place] the patient contacted their pharmacy yes    Preferred [facility] [place]     Use to be [employer]: Please be advised that RX refills may take up to 3 business days. We ask that you follow-up with your pharmacy.

## 2017-03-22 ENCOUNTER — Telehealth: Payer: Self-pay | Admitting: Family Medicine

## 2017-03-22 NOTE — Telephone Encounter (Signed)
Copied from [place] [phone]. Topic: Quick Communication - See Telephone Encounter >> Mar 22, 2017  4:16 PM Vernona Rieger wrote: CRM for notification. See Telephone encounter for:   03/22/17.  Pt said that [facility] & it is a big mess. She needs a escribe script sent over to HYDROcodone-acetaminophen ([place]) 7.5-325 MG tablet WITH DIRECTIONS, they already have the hard copy. It did not have the directions on it. Please advise.   [facility] [place] [address] [facility] [address] [place]  She can not get through to them on the phone. They told her that it can be electronic sent since they have hard copy

## 2017-03-23 ENCOUNTER — Other Ambulatory Visit: Payer: Self-pay

## 2017-03-23 DIAGNOSIS — G43C0 Periodic headache syndromes in child or adult, not intractable: Secondary | ICD-10-CM

## 2017-03-23 MED ORDER — HYDROCODONE-ACETAMINOPHEN 7.5-325 MG PO TABS: ORAL_TABLET | ORAL | 0 refills | Status: DC

## 2017-03-23 NOTE — Telephone Encounter (Signed)
Spoke with pharmacy, stated that the Rx for hydrocodone was deleted due to changing over from [place] to [employer] and so pt needs a new Rx.

## 2017-03-23 NOTE — Telephone Encounter (Signed)
Called pt left a message regarding her Rx Request. Rx is ready for pick up at the office

## 2017-04-05 ENCOUNTER — Other Ambulatory Visit: Payer: Self-pay | Admitting: *Deleted

## 2017-04-05 MED ORDER — METHOTREXATE SODIUM CHEMO INJECTION 50 MG/2ML: 20.0000 mg | INTRAMUSCULAR | 0 refills | Status: DC

## 2017-04-05 NOTE — Telephone Encounter (Signed)
Last Visit: 12/14/16 Next Visit: 05/17/16 Labs: 02/25/17 Elevated glucose all other labs WNL  Okay to refill per Dr. Estanislado Pandy

## 2017-04-10 ENCOUNTER — Other Ambulatory Visit: Payer: Self-pay | Admitting: Family Medicine

## 2017-04-10 DIAGNOSIS — Z8669 Personal history of other diseases of the nervous system and sense organs: Secondary | ICD-10-CM

## 2017-05-03 ENCOUNTER — Ambulatory Visit: Payer: 59 | Admitting: Family Medicine

## 2017-05-03 ENCOUNTER — Encounter: Payer: Self-pay | Admitting: Family Medicine

## 2017-05-03 VITALS — BP 122/84 | HR 96 | Temp 98.0°F | Wt 214.0 lb

## 2017-05-03 DIAGNOSIS — R062 Wheezing: Secondary | ICD-10-CM | POA: Diagnosis not present

## 2017-05-03 DIAGNOSIS — J45901 Unspecified asthma with (acute) exacerbation: Secondary | ICD-10-CM | POA: Diagnosis not present

## 2017-05-03 MED ORDER — HYDROCODONE-HOMATROPINE 5-1.5 MG/5ML PO SYRP: 5.0000 mL | ORAL_SOLUTION | Freq: Three times a day (TID) | ORAL | 0 refills | Status: DC | PRN

## 2017-05-03 MED ORDER — PREDNISONE 20 MG PO TABS: ORAL_TABLET | ORAL | 1 refills | Status: DC

## 2017-05-03 MED ORDER — ALBUTEROL SULFATE HFA 108 (90 BASE) MCG/ACT IN AERS: 2.0000 | INHALATION_SPRAY | Freq: Four times a day (QID) | RESPIRATORY_TRACT | 2 refills | Status: DC | PRN

## 2017-05-03 MED ORDER — IPRATROPIUM-ALBUTEROL 0.5-2.5 (3) MG/3ML IN SOLN
3.0000 mL | Freq: Once | RESPIRATORY_TRACT | Status: AC
  Administered 2017-05-03: 3 mL via RESPIRATORY_TRACT

## 2017-05-03 NOTE — Patient Instructions (Addendum)
Rest at home today and tomorrow  Albuterol MDI........ 2 puffs 3 times daily  Prednisone 20 mg........ 2 now.. 2 tabs tonight at bedtime,,,,,, then starting tomorrow 1 tab daily until the wheezing is completely gone,,,,,,,,,,, then taper as outlined  Okay to go back to work on Thursday if your okay  Return next Monday at 3:30 for follow-up

## 2017-05-03 NOTE — Progress Notes (Signed)
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 This spring it's worse. A week ago she began coughing and wheezing.  No fever etc. Throat etc. Etc. She's very sensitive to pollen plus she was cleaning her beach house recently  BP 122/84 (BP Location: Left Arm, Patient Position: Sitting, Cuff Size: Large)   Pulse 96   Temp 98 F (36.7 C) (Oral)   Wt 214 lb (97.1 kg)   SpO2 98%   BMI 32.54 kg/m  Well-developed well-nourished female no acute distress vital signs stable she's afebrile HEENT were negative neck was supple no adenopathy lungs showed symmetrical inspiratory and expiratory mild to moderate wheezing.  #1 asthma......... Nebulizer....... Oral prednisone.......Marland Kitchen Albuterol MDI....... 2+3 times a day........ Follow-up in 5 days

## 2017-05-03 NOTE — Progress Notes (Deleted)
Office Visit Note  Patient: Cheryl Knight             Date of Birth: February 28, 1955           MRN: [id number]             PCP: Dorena Cookey, MD Referring: Dorena Cookey, MD Visit Date: 05/17/2017 Occupation: @GUAROCC @    Subjective:  No chief complaint on file.   History of Present Illness: Cheryl Knight is a 62 y.o. female ***   Activities of Daily Living:  Patient reports morning stiffness for *** {minute/hour:19697}.   Patient {ACTIONS;DENIES/REPORTS:21021675::"Denies"} nocturnal pain.  Difficulty dressing/grooming: {ACTIONS;DENIES/REPORTS:21021675::"Denies"} Difficulty climbing stairs: {ACTIONS;DENIES/REPORTS:21021675::"Denies"} Difficulty getting out of chair: {ACTIONS;DENIES/REPORTS:21021675::"Denies"} Difficulty using hands for taps, buttons, cutlery, and/or writing: {ACTIONS;DENIES/REPORTS:21021675::"Denies"}   No Rheumatology ROS completed.   PMFS History:  Patient Active Problem List   Diagnosis Date Noted  . Moderate asthma with acute exacerbation 05/03/2017  . Routine general medical examination at a health care facility 02/08/2017  . Primary osteoarthritis of both feet 02/10/2016  . History of migraine 02/07/2016  . History of hypothyroidism 02/07/2016  . History of gastroesophageal reflux (GERD) 02/07/2016  . Obese 12/30/2015  . Basal cell carcinoma of chest wall 10/05/2011  . Hypertension 09/01/2010  . Hyperlipidemia 02/27/2008  . Hypothyroidism 02/21/2007  . Migraine 02/21/2007  . VAGINITIS, ATROPHIC 02/21/2007  . Psoriasis 02/21/2007  . Sicca syndrome, unspecified ([place]) 02/21/2007  . GERD 06/29/2006  . Rheumatoid arthritis ([place]) 06/29/2006    Past Medical History:  Diagnosis Date  . High cholesterol   . Hypertension   . Irritable bowel   . Rheumatoid arthritis ([place])   . Thyroid disease     No family history on file. Past Surgical History:  Procedure Laterality Date  . ABDOMINAL HYSTERECTOMY    . TONSILLECTOMY    . TUBAL  LIGATION     Social History   Social History Narrative  . Not on file     Objective: Vital Signs: There were no vitals taken for this visit.   Physical Exam   Musculoskeletal Exam: ***  CDAI Exam: No CDAI exam completed.    Investigation: No additional findings. CBC Latest Ref Rng & Units 02/25/2017 02/08/2017 12/03/2016  WBC 3.4 - 10.8 x10E3/uL 8.2 6.8 6.8  Hemoglobin 11.1 - 15.9 g/dL 14.0 14.1 13.8  Hematocrit 34.0 - 46.6 % 42.7 43.1 40.5  Platelets 150 - 379 x10E3/uL 335 317.0 323   CMP Latest Ref Rng & Units 02/25/2017 02/08/2017 12/03/2016  Glucose 65 - 99 mg/dL 120(H) 98 125(H)  BUN 8 - 27 mg/dL 13 13 12   Creatinine 0.57 - 1.00 mg/dL 0.77 0.73 0.74  Sodium 134 - 144 mmol/L 142 141 143  Potassium 3.5 - 5.2 mmol/L 3.8 4.0 3.8  Chloride 96 - 106 mmol/L 102 105 103  CO2 20 - 29 mmol/L 24 28 24   Calcium 8.7 - 10.3 mg/dL 10.1 9.8 9.7  Total Protein 6.0 - 8.5 g/dL 7.2 6.9 7.0  Total Bilirubin 0.0 - 1.2 mg/dL <0.2 0.3 0.2  Alkaline Phos 39 - 117 IU/L 70 56 62  AST 0 - 40 IU/L 17 14 16   ALT 0 - 32 IU/L 12 9 11     Imaging: No results found.  Speciality Comments: No specialty comments available.    Procedures:  No procedures performed Allergies: Meperidine hcl and Pentazocine lactate   Assessment / Plan:     Visit Diagnoses: Rheumatoid arthritis involving multiple sites with positive rheumatoid factor (Cheryl Knight)  High risk medication use - MTX 0.8 ml and folic acid 2 mg   Psoriasis  Sicca syndrome, unspecified (HCC)  Primary osteoarthritis of both knees  Primary osteoarthritis of both feet  Bilateral plantar fasciitis  Essential hypertension  History of migraine  History of gastroesophageal reflux (GERD)  History of hypothyroidism  History of hyperlipidemia    Orders: No orders of the defined types were placed in this encounter.  No orders of the defined types were placed in this encounter.   Face-to-face time spent with patient was *** minutes.  50% of time was spent in counseling and coordination of care.  Follow-Up Instructions: No follow-ups on file.   Ofilia Neas, PA-C  Note - This record has been created using Dragon software.  Chart creation errors have been sought, but may not always  have been located. Such creation errors do not reflect on  the standard of medical care.

## 2017-05-06 ENCOUNTER — Telehealth: Payer: Self-pay | Admitting: Family Medicine

## 2017-05-06 NOTE — Telephone Encounter (Signed)
Copied from [place] [phone]. Topic: Quick Communication - See Telephone Encounter >> May 06, 2017  8:39 AM Ahmed Prima L wrote: CRM for notification. See Telephone encounter for: 05/06/17.  Patient said she was seen in the office on 4/16 for a cough. She said now she has a temp of 101.0 with a lot of congestion and it is making her teeth hurt. She will be back in on Monday @ 3:30 to see Dr Sherren Mocha. She is asking if something can be called in for this. She is aware the office is closed today & verbalized understanding.   [phone] - [place], [address]

## 2017-05-09 ENCOUNTER — Ambulatory Visit: Payer: 59 | Admitting: Family Medicine

## 2017-05-09 ENCOUNTER — Other Ambulatory Visit: Payer: Self-pay

## 2017-05-09 ENCOUNTER — Encounter: Payer: Self-pay | Admitting: Family Medicine

## 2017-05-09 VITALS — BP 104/70 | HR 98 | Temp 98.3°F | Wt 213.0 lb

## 2017-05-09 DIAGNOSIS — J45901 Unspecified asthma with (acute) exacerbation: Secondary | ICD-10-CM

## 2017-05-09 MED ORDER — AMOXICILLIN 875 MG PO TABS: 875.0000 mg | ORAL_TABLET | Freq: Two times a day (BID) | ORAL | 0 refills | Status: DC

## 2017-05-09 NOTE — Patient Instructions (Signed)
Prednisone 20 mg...Marland KitchenMarland KitchenMarland Kitchen take 40 mg Tuesday and Wednesday....... then Thursday start tapering as follows......... 30 mg a day for 3 days....... 20 mg a day for 3 days...Marland KitchenMarland Kitchen 10 mg a day for 3 days........ then 10 mg Monday Wednesday Friday for a three-week taper  Return when necessary

## 2017-05-09 NOTE — Progress Notes (Signed)
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 We started on prednisone 40 mg daily hand-held nebulizer was given here in the office. She was also given an MDI to take home. She's been using the MDI 3 times daily. She comes back today saying she feels about 50% better.  Last Friday she developed fever and dental pain. She called her doctor work who gave her a Z-Pak. It hasn't helped that much  BP 104/70 (BP Location: Left Arm, Patient Position: Sitting, Cuff Size: Large)   Pulse 98   Temp 98.3 F (36.8 C) (Oral)   Wt 213 lb (96.6 kg)   SpO2 98%   BMI 32.39 kg/m  Well-developed well-nourished female no acute distress vital signs stable she's afebrile HEENT were negative neck was supple no adenopathy lungs are clear except for some very late expiratory wheezing on forced expiration  #1 asthma resolving slowly..... Taper prednisone as outlined....Marland KitchenMarland Kitchen

## 2017-05-10 NOTE — Telephone Encounter (Signed)
Pt was seen by in the office on 05/09/2017 and Dr Sherren Mocha sent in Antibiotics to the pharmacy.

## 2017-05-11 ENCOUNTER — Other Ambulatory Visit: Payer: Self-pay | Admitting: *Deleted

## 2017-05-11 MED ORDER — PILOCARPINE HCL 5 MG PO TABS: 5.0000 mg | ORAL_TABLET | Freq: Three times a day (TID) | ORAL | 0 refills | Status: DC

## 2017-05-11 NOTE — Telephone Encounter (Signed)
Refill request received via fax  Last Visit: 12/14/16 Next Visit: 05/17/16  Okay to refill per Dr. Estanislado Pandy

## 2017-05-12 NOTE — Telephone Encounter (Signed)
Spoke with pt stated that she woke up with a fever but is still taking the antibiotics ( Amoxicillin).Pt stated that if she didn't feel better by morning she will schedule an appointment to be seen by any provider in the office.

## 2017-05-12 NOTE — Telephone Encounter (Signed)
Pt called to let Dr Sherren Mocha know she has gone through zpack,  And now on  amoxacillin  And doesn't think  It has had time to start working, as she woke up with a slight fever, she states she will call tomorrow for apt if no better tomorrow

## 2017-05-17 ENCOUNTER — Ambulatory Visit: Payer: 59 | Admitting: Physician Assistant

## 2017-05-18 ENCOUNTER — Other Ambulatory Visit: Payer: Self-pay | Admitting: Rheumatology

## 2017-05-18 NOTE — Telephone Encounter (Signed)
Last Visit: 12/14/16 Next Visit due 04/2016. Message sent to front to schedule patient  Labs: 02/25/17  Glucose mildly elevated. All other labs are WNL.    Okay to refill per Dr. Estanislado Pandy

## 2017-05-19 ENCOUNTER — Telehealth: Payer: Self-pay | Admitting: Rheumatology

## 2017-05-19 NOTE — Telephone Encounter (Signed)
-----   Message from Carole Binning, LPN sent at 0/03/7046  8:32 AM EDT ----- Regarding: Please schedule patient for follow up visit Please schedule patient for follow up visit. Patient due April 2019. Thanks!

## 2017-05-19 NOTE — Telephone Encounter (Signed)
LMOM (home & mobile) to call and schedule follow-up appointment

## 2017-05-27 NOTE — Progress Notes (Deleted)
Office Visit Note  Patient: Cheryl Knight             Date of Birth: 1955-01-31           MRN: [id number]             PCP: Dorena Cookey, MD Referring: Dorena Cookey, MD Visit Date: 06/10/2017 Occupation: @GUAROCC @    Subjective:  No chief complaint on file.   History of Present Illness: Cheryl Knight is a 62 y.o. female ***   Activities of Daily Living:  Patient reports morning stiffness for *** {minute/hour:19697}.   Patient {ACTIONS;DENIES/REPORTS:21021675::"Denies"} nocturnal pain.  Difficulty dressing/grooming: {ACTIONS;DENIES/REPORTS:21021675::"Denies"} Difficulty climbing stairs: {ACTIONS;DENIES/REPORTS:21021675::"Denies"} Difficulty getting out of chair: {ACTIONS;DENIES/REPORTS:21021675::"Denies"} Difficulty using hands for taps, buttons, cutlery, and/or writing: {ACTIONS;DENIES/REPORTS:21021675::"Denies"}   No Rheumatology ROS completed.   PMFS History:  Patient Active Problem List   Diagnosis Date Noted  . Moderate asthma with acute exacerbation 05/03/2017  . Routine general medical examination at a health care facility 02/08/2017  . Primary osteoarthritis of both feet 02/10/2016  . History of migraine 02/07/2016  . History of hypothyroidism 02/07/2016  . History of gastroesophageal reflux (GERD) 02/07/2016  . Obese 12/30/2015  . Basal cell carcinoma of chest wall 10/05/2011  . Hypertension 09/01/2010  . Hyperlipidemia 02/27/2008  . Hypothyroidism 02/21/2007  . Migraine 02/21/2007  . VAGINITIS, ATROPHIC 02/21/2007  . Psoriasis 02/21/2007  . Sicca syndrome, unspecified ([place]) 02/21/2007  . GERD 06/29/2006  . Rheumatoid arthritis ([place]) 06/29/2006    Past Medical History:  Diagnosis Date  . High cholesterol   . Hypertension   . Irritable bowel   . Rheumatoid arthritis ([place])   . Thyroid disease     No family history on file. Past Surgical History:  Procedure Laterality Date  . ABDOMINAL HYSTERECTOMY    . TONSILLECTOMY    . TUBAL  LIGATION     Social History   Social History Narrative  . Not on file     Objective: Vital Signs: There were no vitals taken for this visit.   Physical Exam   Musculoskeletal Exam: ***  CDAI Exam: No CDAI exam completed.    Investigation: No additional findings. CBC Latest Ref Rng & Units 02/25/2017 02/08/2017 12/03/2016  WBC 3.4 - 10.8 x10E3/uL 8.2 6.8 6.8  Hemoglobin 11.1 - 15.9 g/dL 14.0 14.1 13.8  Hematocrit 34.0 - 46.6 % 42.7 43.1 40.5  Platelets 150 - 379 x10E3/uL 335 317.0 323   CMP Latest Ref Rng & Units 02/25/2017 02/08/2017 12/03/2016  Glucose 65 - 99 mg/dL 120(H) 98 125(H)  BUN 8 - 27 mg/dL 13 13 12   Creatinine 0.57 - 1.00 mg/dL 0.77 0.73 0.74  Sodium 134 - 144 mmol/L 142 141 143  Potassium 3.5 - 5.2 mmol/L 3.8 4.0 3.8  Chloride 96 - 106 mmol/L 102 105 103  CO2 20 - 29 mmol/L 24 28 24   Calcium 8.7 - 10.3 mg/dL 10.1 9.8 9.7  Total Protein 6.0 - 8.5 g/dL 7.2 6.9 7.0  Total Bilirubin 0.0 - 1.2 mg/dL <0.2 0.3 0.2  Alkaline Phos 39 - 117 IU/L 70 56 62  AST 0 - 40 IU/L 17 14 16   ALT 0 - 32 IU/L 12 9 11     Imaging: No results found.  Speciality Comments: No specialty comments available.    Procedures:  No procedures performed Allergies: Meperidine hcl and Pentazocine lactate   Assessment / Plan:     Visit Diagnoses: Rheumatoid arthritis involving multiple sites with positive rheumatoid factor ([place])  Psoriasis  High risk medication use - Methotrexate 6.1QA per week, folic acid 2 mg a day  Primary osteoarthritis of both knees  Primary osteoarthritis of both feet  Sicca syndrome (HCC)  History of migraine  History of gastroesophageal reflux (GERD)  History of hypothyroidism  History of hyperlipidemia  Plantar fasciitis    Orders: No orders of the defined types were placed in this encounter.  No orders of the defined types were placed in this encounter.   Face-to-face time spent with patient was *** minutes. 50% of time was spent in  counseling and coordination of care.  Follow-Up Instructions: No follow-ups on file.   Ofilia Neas, PA-C  Note - This record has been created using Dragon software.  Chart creation errors have been sought, but may not always  have been located. Such creation errors do not reflect on  the standard of medical care.

## 2017-05-31 ENCOUNTER — Other Ambulatory Visit: Payer: 59

## 2017-06-02 ENCOUNTER — Telehealth: Payer: Self-pay | Admitting: Rheumatology

## 2017-06-02 ENCOUNTER — Other Ambulatory Visit: Payer: Self-pay | Admitting: *Deleted

## 2017-06-02 DIAGNOSIS — Z79899 Other long term (current) drug therapy: Secondary | ICD-10-CM

## 2017-06-02 NOTE — Telephone Encounter (Signed)
Patient called requesting labwork orders to be sent to [facility] on [address].  Patient states she is going this afternoon.

## 2017-06-02 NOTE — Telephone Encounter (Signed)
Lab Orders released.  

## 2017-06-10 ENCOUNTER — Ambulatory Visit: Payer: 59 | Admitting: Physician Assistant

## 2017-06-21 NOTE — Progress Notes (Signed)
Office Visit Note  Patient: Cheryl Knight             Date of Birth: 1955-07-07           MRN: [id number]             PCP: Dorena Cookey, MD Referring: Dorena Cookey, MD Visit Date: 07/05/2017 Occupation: @GUAROCC @    Subjective:  Left SI joint pain   History of Present Illness: Cheryl Knight is a 62 y.o. female with history of seropositive rheumatoid arthritis, sicca symptoms, and osteoarthritis.  Patient continues to inject methotrexate subcutaneously 0.8 mL every week and takes folic acid 2 mg daily.  She feels as though her rheumatoid arthritis has been very well controlled.  She denies any rheumatoid arthritis flares recently.  She reports that she was recently put on high-dose prednisone due to having asthmatic bronchitis.  She finished her last dose of prednisone at the beginning of June.  She denies any hand pain or swelling.  She has intermittent discomfort in bilateral feet and bilateral ankles but no swelling.  She states that her bilateral knees to continue to cause discomfort especially if she is kneeling or going up and down stairs.  She states that she will intermittently have swelling in her bilateral knees.  Her left SI joint has been causing increased discomfort. She states that her psoriasis has cleared since starting on methotrexate.  She continues to take pilocarpine twice daily as needed to help with mouth dryness and eye dryness.   Activities of Daily Living:  Patient reports morning stiffness for 10  minutes.   Patient Denies nocturnal pain.  Difficulty dressing/grooming: Denies Difficulty climbing stairs: Reports Difficulty getting out of chair: Reports Difficulty using hands for taps, buttons, cutlery, and/or writing: Denies   Review of Systems  Constitutional: Positive for fatigue.  HENT: Positive for mouth dryness. Negative for mouth sores and nose dryness.   Eyes: Positive for dryness. Negative for pain and visual disturbance.  Respiratory:  Negative for cough, hemoptysis, shortness of breath and difficulty breathing.   Cardiovascular: Negative for chest pain, palpitations, hypertension and swelling in legs/feet.  Gastrointestinal: Negative for blood in stool, constipation and diarrhea.  Endocrine: Negative for increased urination.  Genitourinary: Negative for painful urination.  Musculoskeletal: Positive for arthralgias, joint pain, joint swelling and morning stiffness. Negative for myalgias, muscle weakness, muscle tenderness and myalgias.  Skin: Negative for color change, pallor, rash, hair loss, nodules/bumps, skin tightness, ulcers and sensitivity to sunlight.  Allergic/Immunologic: Negative for susceptible to infections.  Neurological: Negative for dizziness, numbness, headaches and weakness.  Hematological: Negative for swollen glands.  Psychiatric/Behavioral: Negative for depressed mood and sleep disturbance. The patient is not nervous/anxious.     PMFS History:  Patient Active Problem List   Diagnosis Date Noted  . Moderate asthma with acute exacerbation 05/03/2017  . Routine general medical examination at a health care facility 02/08/2017  . Primary osteoarthritis of both feet 02/10/2016  . History of migraine 02/07/2016  . History of hypothyroidism 02/07/2016  . History of gastroesophageal reflux (GERD) 02/07/2016  . Obese 12/30/2015  . Basal cell carcinoma of chest wall 10/05/2011  . Hypertension 09/01/2010  . Hyperlipidemia 02/27/2008  . Hypothyroidism 02/21/2007  . Migraine 02/21/2007  . VAGINITIS, ATROPHIC 02/21/2007  . Psoriasis 02/21/2007  . Sicca syndrome, unspecified ([place]) 02/21/2007  . GERD 06/29/2006  . Rheumatoid arthritis ([place]) 06/29/2006    Past Medical History:  Diagnosis Date  . High cholesterol   .  Hypertension   . Irritable bowel   . Rheumatoid arthritis ([place])   . Thyroid disease     History reviewed. No pertinent family history. Past Surgical History:  Procedure Laterality Date  .  ABDOMINAL HYSTERECTOMY    . TONSILLECTOMY    . TUBAL LIGATION     Social History   Social History Narrative  . Not on file     Objective: Vital Signs: BP 134/77 (BP Location: Left Arm, Patient Position: Sitting, Cuff Size: Normal)   Pulse (!) 103   Resp 15   Ht 5\' 8"  (1.727 m)   Wt 214 lb (97.1 kg)   BMI 32.54 kg/m    Physical Exam  Constitutional: She is oriented to person, place, and time. She appears well-developed and well-nourished.  HENT:  Head: Normocephalic and atraumatic.  No parotid swelling. No oral or nasal ulcerations.  Eyes: Conjunctivae and EOM are normal.  Neck: Normal range of motion.  Cardiovascular: Normal rate, regular rhythm, normal heart sounds and intact distal pulses.  Pulmonary/Chest: Effort normal and breath sounds normal.  Abdominal: Soft. Bowel sounds are normal.  Lymphadenopathy:    She has no cervical adenopathy.  Neurological: She is alert and oriented to person, place, and time.  Skin: Skin is warm and dry. Capillary refill takes less than 2 seconds.  Psychiatric: She has a normal mood and affect. Her behavior is normal.  Nursing note and vitals reviewed.    Musculoskeletal Exam: C-spine, thoracic spine, and lumbar spine good ROM. No midline spinal tenderness. Shoulder joints, elbow joints, wrist joints, MCPs, PIPs, and DIPs good ROM with no synovitis. Ganglion cyst on the volar aspect. Hip joints, knee joints, ankle joints, MTPs, PIPs, and DIPs good ROM with no synovitis.  No warmth or effusion of knee joints.  No tenderness of bilateral trochanteric bursitis. Left SI joint tenderness.    CDAI Exam: CDAI Homunculus Exam:   Joint Counts:  CDAI Tender Joint count: 0 CDAI Swollen Joint count: 0  Global Assessments:  Patient Global Assessment: 2 Provider Global Assessment: 2  CDAI Calculated Score: 4    Investigation: No additional findings. CBC Latest Ref Rng & Units 06/24/2017 02/25/2017 02/08/2017  WBC 3.4 - 10.8 x10E3/uL 6.8 8.2  6.8  Hemoglobin 11.1 - 15.9 g/dL 13.9 14.0 14.1  Hematocrit 34.0 - 46.6 % 41.6 42.7 43.1  Platelets 150 - 450 x10E3/uL 361 335 317.0   CMP Latest Ref Rng & Units 06/24/2017 02/25/2017 02/08/2017  Glucose 65 - 99 mg/dL 119(H) 120(H) 98  BUN 8 - 27 mg/dL 12 13 13   Creatinine 0.57 - 1.00 mg/dL 0.65 0.77 0.73  Sodium 134 - 144 mmol/L 141 142 141  Potassium 3.5 - 5.2 mmol/L 3.9 3.8 4.0  Chloride 96 - 106 mmol/L 101 102 105  CO2 20 - 29 mmol/L 24 24 28   Calcium 8.7 - 10.3 mg/dL 9.9 10.1 9.8  Total Protein 6.0 - 8.5 g/dL 6.7 7.2 6.9  Total Bilirubin 0.0 - 1.2 mg/dL <0.2 <0.2 0.3  Alkaline Phos 39 - 117 IU/L 59 70 56  AST 0 - 40 IU/L 14 17 14   ALT 0 - 32 IU/L 12 12 9      Imaging: No results found.  Speciality Comments: No specialty comments available.    Procedures:  No procedures performed Allergies: Meperidine hcl and Pentazocine lactate   Assessment / Plan:     Visit Diagnoses: Rheumatoid arthritis involving multiple sites with positive rheumatoid factor ([place]) - +RF: She has no synovitis on exam.  She has not had any recent rheumatoid arthritis flares.  She was recently put on high-dose prednisone for treatment of asthmatic bronchitis.  She finished her last dose of prednisone in the beginning of June.  She continues to inject methotrexate 0.8 mL subcutaneously once weekly and takes folic acid 2 mg daily.  She does not need any refills at this time.  She will continue on his current treatment regimen.  High risk medication use -  Methotrexate 3.5TI per week, folic acid 2 mg a day.  She had lab work on 06/24/2017.  Glucose is 119 all other lab values are within normal limits.  She will return for lab work in September and every 3 months to monitor for drug toxicity.   Psoriasis: She has no active psoriasis.  Primary osteoarthritis of both knees: She has chronic pain in bilateral knee joints.  She has intermittent swelling in her knee joints.  She is good range of motion with no warmth or  effusion.  She takes tramadol 50 mg 2 tablets twice daily as needed for pain relief.  She requested a refill of tramadol today.  We will refill 1 month supply.  Primary osteoarthritis of both feet: She has occasional discomfort in her bilateral feet.  The importance of wearing proper fitting shoes was discussed.  Sicca syndrome [facility]): She continues to have sicca symptoms.  She takes pilocarpine twice daily as needed which helps with her symptoms.  Other medical conditions are listed as follows:  History of migraine  History of gastroesophageal reflux (GERD)  History of hypothyroidism  History of hyperlipidemia  Plantar fasciitis of right foot: Resolved.   Orders: No orders of the defined types were placed in this encounter.  No orders of the defined types were placed in this encounter.   Face-to-face time spent with patient was 30 minutes. >50% of time was spent in counseling and coordination of care.  Follow-Up Instructions: Return in about 5 months (around 12/05/2017) for Rheumatoid arthritis, Osteoarthritis.   Ofilia Neas, PA-C I examined and evaluated the patient with Hazel Sams PA.  Patient had no synovitis on my examination.  She will continue methotrexate.  The plan of care was discussed as noted above.  Bo Merino, MD Note - This record has been created using Editor, commissioning.  Chart creation errors have been sought, but may not always  have been located. Such creation errors do not reflect on  the standard of medical care.

## 2017-06-23 ENCOUNTER — Other Ambulatory Visit: Payer: Self-pay | Admitting: Rheumatology

## 2017-06-23 NOTE — Telephone Encounter (Signed)
Last Visit: 12/14/16 Next Visit: 07/05/17 Labs: 02/25/17  Glucose mildly elevated. All other labs are WNL.   Okay to refill 30 day supply MTX?

## 2017-06-23 NOTE — Telephone Encounter (Signed)
ok 

## 2017-06-24 DIAGNOSIS — Z79899 Other long term (current) drug therapy: Secondary | ICD-10-CM | POA: Diagnosis not present

## 2017-06-25 LAB — CMP14+EGFR
ALT: 12 IU/L (ref 0–32)
AST: 14 IU/L (ref 0–40)
Albumin/Globulin Ratio: 1.7 (ref 1.2–2.2)
Albumin: 4.2 g/dL (ref 3.6–4.8)
Alkaline Phosphatase: 59 IU/L (ref 39–117)
BUN/Creatinine Ratio: 18 (ref 12–28)
BUN: 12 mg/dL (ref 8–27)
Bilirubin Total: <0.2 mg/dL (ref 0.0–1.2)
CO2: 24 mmol/L (ref 20–29)
Calcium: 9.9 mg/dL (ref 8.7–10.3)
Chloride: 101 mmol/L (ref 96–106)
Creatinine, Ser: 0.65 mg/dL (ref 0.57–1.00)
GFR calc Af Amer: 109 mL/min/{1.73_m2} (ref >59)
GFR calc non Af Amer: 95 mL/min/{1.73_m2} (ref >59)
Globulin, Total: 2.5 g/dL (ref 1.5–4.5)
Glucose: 119 mg/dL — ABNORMAL HIGH (ref 65–99)
Potassium: 3.9 mmol/L (ref 3.5–5.2)
Sodium: 141 mmol/L (ref 134–144)
Total Protein: 6.7 g/dL (ref 6.0–8.5)

## 2017-06-25 LAB — CBC WITH DIFFERENTIAL/PLATELET
Basophils Absolute: 0 10*3/uL (ref 0.0–0.2)
Basos: 1 %
EOS (ABSOLUTE): 0.2 10*3/uL (ref 0.0–0.4)
Eos: 2 %
Hematocrit: 41.6 % (ref 34.0–46.6)
Hemoglobin: 13.9 g/dL (ref 11.1–15.9)
Immature Grans (Abs): 0 10*3/uL (ref 0.0–0.1)
Immature Granulocytes: 0 %
Lymphocytes Absolute: 2.9 10*3/uL (ref 0.7–3.1)
Lymphs: 42 %
MCH: 31.7 pg (ref 26.6–33.0)
MCHC: 33.4 g/dL (ref 31.5–35.7)
MCV: 95 fL (ref 79–97)
Monocytes Absolute: 0.6 10*3/uL (ref 0.1–0.9)
Monocytes: 9 %
Neutrophils Absolute: 3.1 10*3/uL (ref 1.4–7.0)
Neutrophils: 46 %
Platelets: 361 10*3/uL (ref 150–450)
RBC: 4.39 x10E6/uL (ref 3.77–5.28)
RDW: 14 % (ref 12.3–15.4)
WBC: 6.8 10*3/uL (ref 3.4–10.8)

## 2017-06-26 ENCOUNTER — Other Ambulatory Visit: Payer: Self-pay | Admitting: Physician Assistant

## 2017-06-28 ENCOUNTER — Other Ambulatory Visit (INDEPENDENT_AMBULATORY_CARE_PROVIDER_SITE_OTHER): Payer: 59

## 2017-06-28 DIAGNOSIS — R7309 Other abnormal glucose: Secondary | ICD-10-CM | POA: Diagnosis not present

## 2017-06-28 LAB — HEMOGLOBIN A1C: Hgb A1c MFr Bld: 6.9 % — ABNORMAL HIGH (ref 4.6–6.5)

## 2017-07-01 ENCOUNTER — Telehealth: Payer: Self-pay | Admitting: Family Medicine

## 2017-07-01 NOTE — Telephone Encounter (Signed)
Pt given lab results and documented in result note.  Pt also stating that she received a call from [place] regarding Tramadol prescription and states she will come in on Monday to pick up the prescription.

## 2017-07-01 NOTE — Telephone Encounter (Unsigned)
Copied from [place] [id number]. Topic: General - Other >> Jul 01, 2017  1:08 PM Yvette Rack wrote: Reason for CRM: Pt returned call for lab results. Cb# [phone]

## 2017-07-01 NOTE — Telephone Encounter (Signed)
Noted  

## 2017-07-05 ENCOUNTER — Encounter: Payer: Self-pay | Admitting: Physician Assistant

## 2017-07-05 ENCOUNTER — Ambulatory Visit: Payer: 59 | Admitting: Rheumatology

## 2017-07-05 VITALS — BP 134/77 | HR 103 | Resp 15 | Ht 68.0 in | Wt 214.0 lb

## 2017-07-05 DIAGNOSIS — M170 Bilateral primary osteoarthritis of knee: Secondary | ICD-10-CM

## 2017-07-05 DIAGNOSIS — Z79899 Other long term (current) drug therapy: Secondary | ICD-10-CM

## 2017-07-05 DIAGNOSIS — M19072 Primary osteoarthritis, left ankle and foot: Secondary | ICD-10-CM | POA: Diagnosis not present

## 2017-07-05 DIAGNOSIS — Z8669 Personal history of other diseases of the nervous system and sense organs: Secondary | ICD-10-CM

## 2017-07-05 DIAGNOSIS — Z8639 Personal history of other endocrine, nutritional and metabolic disease: Secondary | ICD-10-CM | POA: Diagnosis not present

## 2017-07-05 DIAGNOSIS — M3500 Sicca syndrome, unspecified: Secondary | ICD-10-CM

## 2017-07-05 DIAGNOSIS — Z8719 Personal history of other diseases of the digestive system: Secondary | ICD-10-CM

## 2017-07-05 DIAGNOSIS — M722 Plantar fascial fibromatosis: Secondary | ICD-10-CM

## 2017-07-05 DIAGNOSIS — M19071 Primary osteoarthritis, right ankle and foot: Secondary | ICD-10-CM | POA: Diagnosis not present

## 2017-07-05 DIAGNOSIS — M0579 Rheumatoid arthritis with rheumatoid factor of multiple sites without organ or systems involvement: Secondary | ICD-10-CM | POA: Diagnosis not present

## 2017-07-05 DIAGNOSIS — L409 Psoriasis, unspecified: Secondary | ICD-10-CM

## 2017-07-05 MED ORDER — TRAMADOL HCL 50 MG PO TABS: ORAL_TABLET | ORAL | 0 refills | Status: DC

## 2017-07-05 NOTE — Patient Instructions (Addendum)
Sacroiliac Joint Dysfunction Sacroiliac joint dysfunction is a condition that causes inflammation on one or both sides of the sacroiliac (SI) joint. The SI joint connects the lower part of the spine (sacrum) with the two upper portions of the pelvis (ilium). This condition causes deep aching or burning pain in the low back. In some cases, the pain may also spread into one or both buttocks or hips or spread down the legs. What are the causes? This condition may be caused by:  Pregnancy. During pregnancy, extra stress is put on the SI joints because the pelvis widens.  Injury, such as: ? Car accidents. ? Sport-related injuries. ? Work-related injuries.  Having one leg that is shorter than the other.  Conditions that affect the joints, such as: ? Rheumatoid arthritis. ? Gout. ? Psoriatic arthritis. ? Joint infection (septic arthritis).  Sometimes, the cause of SI joint dysfunction is not known. What are the signs or symptoms? Symptoms of this condition include:  Aching or burning pain in the lower back. The pain may also spread to other areas, such as: ? Buttocks. ? Groin. ? Thighs and legs.  Muscle spasms in or around the painful areas.  Increased pain when standing, walking, running, stair climbing, bending, or lifting.  How is this diagnosed? Your health care provider will do a physical exam and take your medical history. During the exam, the health care provider may move one or both of your legs to different positions to check for pain. Various tests may be done to help verify the diagnosis, including:  Imaging tests to look for other causes of pain. These may include: ? MRI. ? CT scan. ? Bone scan.  Diagnostic injection. A numbing medicine is injected into the SI joint using a needle. If the pain is temporarily improved or stopped after the injection, this can indicate that SI joint dysfunction is the problem.  How is this treated? Treatment may vary depending on the  cause and severity of your condition. Treatment options may include:  Applying ice or heat to the lower back area. This can help to reduce pain and muscle spasms.  Medicines to relieve pain or inflammation or to relax the muscles.  Wearing a back brace (sacroiliac brace) to help support the joint while your back is healing.  Physical therapy to increase muscle strength around the joint and flexibility at the joint. This may also involve learning proper body positions and ways of moving to relieve stress on the joint.  Direct manipulation of the SI joint.  Injections of steroid medicine into the joint in order to reduce pain and swelling.  Radiofrequency ablation to burn away nerves that are carrying pain messages from the joint.  Use of a device that provides electrical stimulation in order to reduce pain at the joint.  Surgery to put in screws and plates that limit or prevent joint motion. This is rare.  Follow these instructions at home:  Rest as needed. Limit your activities as directed by your health care provider.  Take medicines only as directed by your health care provider.  If directed, apply ice to the affected area: ? Put ice in a plastic bag. ? Place a towel between your skin and the bag. ? Leave the ice on for 20 minutes, 2-3 times per day.  Use a heating pad or a moist heat pack as directed by your health care provider.  Exercise as directed by your health care provider or physical therapist.  Keep all follow-up visits   as directed by your health care provider. This is important. Contact a health care provider if:  Your pain is not controlled with medicine.  You have a fever.  You have increasingly severe pain. Get help right away if:  You have weakness, numbness, or tingling in your legs or feet.  You lose control of your bladder or bowel. This information is not intended to replace advice given to you by your health care provider. Make sure you discuss  any questions you have with your health care provider. Document Released: 04/02/2008 Document Revised: 06/12/2015 Document Reviewed: 09/11/2013 Elsevier Interactive Patient Education  2018 [place] We placed an order today for your standing lab work.    Please come back and get your standing labs in September and every 3 months   We have open lab Monday through Friday from 8:30-11:30 AM and 1:30-4:00 PM  at the office of Dr. Bo Merino.   You may experience shorter wait times on Monday and Friday afternoons. The office is located at [address], [place], [place], [place] [address] No appointment is necessary.   Labs are drawn by [facility].  You may receive a bill from [facility] for your lab work. If you have any questions regarding directions or hours of operation,  please call [phone].

## 2017-07-14 DIAGNOSIS — L570 Actinic keratosis: Secondary | ICD-10-CM | POA: Diagnosis not present

## 2017-07-14 DIAGNOSIS — L918 Other hypertrophic disorders of the skin: Secondary | ICD-10-CM | POA: Diagnosis not present

## 2017-07-14 DIAGNOSIS — Z85828 Personal history of other malignant neoplasm of skin: Secondary | ICD-10-CM | POA: Diagnosis not present

## 2017-07-18 ENCOUNTER — Ambulatory Visit (INDEPENDENT_AMBULATORY_CARE_PROVIDER_SITE_OTHER)
Admission: RE | Admit: 2017-07-18 | Discharge: 2017-07-18 | Disposition: A | Discharge status: Home / Self Care | Payer: 59 | Source: Ambulatory Visit | Attending: Family Medicine | Admitting: Family Medicine

## 2017-07-18 ENCOUNTER — Encounter: Payer: Self-pay | Admitting: Family Medicine

## 2017-07-18 ENCOUNTER — Ambulatory Visit: Payer: 59 | Admitting: Family Medicine

## 2017-07-18 VITALS — BP 110/86 | HR 104 | Temp 98.6°F | Wt 207.0 lb

## 2017-07-18 DIAGNOSIS — R059 Cough, unspecified: Secondary | ICD-10-CM

## 2017-07-18 DIAGNOSIS — R05 Cough: Secondary | ICD-10-CM

## 2017-07-18 MED ORDER — HYDROCODONE-HOMATROPINE 5-1.5 MG/5ML PO SYRP: 5.0000 mL | ORAL_SOLUTION | Freq: Every evening | ORAL | 0 refills | Status: DC | PRN

## 2017-07-18 MED ORDER — ALBUTEROL SULFATE (2.5 MG/3ML) 0.083% IN NEBU
2.5000 mg | INHALATION_SOLUTION | Freq: Once | RESPIRATORY_TRACT | Status: AC
  Administered 2017-07-18: 2.5 mg via RESPIRATORY_TRACT

## 2017-07-18 MED ORDER — BENZONATATE 100 MG PO CAPS: 100.0000 mg | ORAL_CAPSULE | Freq: Two times a day (BID) | ORAL | 0 refills | Status: DC | PRN

## 2017-07-18 NOTE — Patient Instructions (Signed)
Cough, Adult  Coughing is a reflex that clears your throat and your airways. Coughing helps to heal and protect your lungs. It is normal to cough occasionally, but a cough that happens with other symptoms or lasts a long time may be a sign of a condition that needs treatment. A cough may last only 2-3 weeks (acute), or it may last longer than 8 weeks (chronic).  What are the causes?  Coughing is commonly caused by:   Breathing in substances that irritate your lungs.   A viral or bacterial respiratory infection.   Allergies.   Asthma.   Postnasal drip.   Smoking.   Acid backing up from the stomach into the esophagus (gastroesophageal reflux).   Certain medicines.   Chronic lung problems, including COPD (or rarely, lung cancer).   Other medical conditions such as heart failure.    Follow these instructions at home:  Pay attention to any changes in your symptoms. Take these actions to help with your discomfort:   Take medicines only as told by your health care provider.  ? If you were prescribed an antibiotic medicine, take it as told by your health care provider. Do not stop taking the antibiotic even if you start to feel better.  ? Talk with your health care provider before you take a cough suppressant medicine.   Drink enough fluid to keep your urine clear or pale yellow.   If the air is dry, use a cold steam vaporizer or humidifier in your bedroom or your home to help loosen secretions.   Avoid anything that causes you to cough at work or at home.   If your cough is worse at night, try sleeping in a semi-upright position.   Avoid cigarette smoke. If you smoke, quit smoking. If you need help quitting, ask your health care provider.   Avoid caffeine.   Avoid alcohol.   Rest as needed.    Contact a health care provider if:   You have new symptoms.   You cough up pus.   Your cough does not get better after 2-3 weeks, or your cough gets worse.   You cannot control your cough with suppressant  medicines and you are losing sleep.   You develop pain that is getting worse or pain that is not controlled with pain medicines.   You have a fever.   You have unexplained weight loss.   You have night sweats.  Get help right away if:   You cough up blood.   You have difficulty breathing.   Your heartbeat is very fast.  This information is not intended to replace advice given to you by your health care provider. Make sure you discuss any questions you have with your health care provider.  Document Released: 07/03/2010 Document Revised: 06/12/2015 Document Reviewed: 03/13/2014  Elsevier Interactive Patient Education  2018 [employer] Inc[employer]

## 2017-07-18 NOTE — Progress Notes (Signed)
Subjective:    Patient ID: Langston Reusing, female    DOB: Mar 26, 1955, 62 y.o.   MRN: [id number]  No chief complaint on file.   HPI Patient was seen today for ongoing concern.  Pt endorses cough x1 month, rhinorrhea, sinus issues.  Pt states she has been given azithromycin, prednisone taper, and inhaler for her symptoms.  Pt states the pain initially seemed to get better with the antibiotic then continued.  Pt endorses mostly dry cough, but endorses green sputum yesterday after coughing spell. Pt denies fever, chills, CP, n/v, ear pain/pressure, face pain/pressure.  Pt is on MTX for RA.  Past Medical History:  Diagnosis Date  . High cholesterol   . Hypertension   . Irritable bowel   . Rheumatoid arthritis ([place])   . Thyroid disease     Allergies  Allergen Reactions  . Meperidine Hcl     REACTION: vomiting  . Pentazocine Lactate     REACTION: hallucinations    ROS General: Denies fever, chills, night sweats, changes in weight, changes in appetite HEENT: Denies headaches, ear pain, changes in vision, sore throat  +rhinorrhea CV: Denies CP, palpitations, SOB, orthopnea Pulm: + cough, SOB, wheezing GI: Denies abdominal pain, nausea, vomiting, diarrhea, constipation GU: Denies dysuria, hematuria, frequency, vaginal discharge Msk: Denies muscle cramps, joint pains Neuro: Denies weakness, numbness, tingling Skin: Denies rashes, bruising Psych: Denies depression, anxiety, hallucinations     Objective:    Blood pressure 110/86, pulse (!) 104, temperature 98.6 F (37 C), temperature source Oral, weight 207 lb (93.9 kg), SpO2 97 %.   Gen. Pleasant, well-nourished, in no distress, normal affect   HEENT: [place]/AT, face symmetric, no scleral icterus, PERRLA, nares patent without drainage, pharynx without erythema or exudate.  TMs normal full b/l.  No cervical lymphadenopathy. Lungs: no accessory muscle use, scattered wheezes and rhonchi throughout. Cardiovascular: RRR, no m/r/g, no  peripheral edema Neuro:  A&Ox3, CN II-XII intact, normal gait   Wt Readings from Last 3 Encounters:  07/18/17 207 lb (93.9 kg)  07/05/17 214 lb (97.1 kg)  05/09/17 213 lb (96.6 kg)    Lab Results  Component Value Date   WBC 6.8 06/24/2017   HGB 13.9 06/24/2017   HCT 41.6 06/24/2017   PLT 361 06/24/2017   GLUCOSE 119 (H) 06/24/2017   CHOL 192 02/08/2017   TRIG 137.0 02/08/2017   HDL 45.40 02/08/2017   LDLDIRECT 147.9 11/20/2012   LDLCALC 119 (H) 02/08/2017   ALT 12 06/24/2017   AST 14 06/24/2017   NA 141 06/24/2017   K 3.9 06/24/2017   CL 101 06/24/2017   CREATININE 0.65 06/24/2017   BUN 12 06/24/2017   CO2 24 06/24/2017   TSH 2.10 02/08/2017   HGBA1C 6.9 (H) 06/28/2017    Assessment/Plan:  Cough  -prolonged.  Pt is on Methotrexate for RA.  S/p prednisone taper, azithromycin, albuterol inhaler.   -will obtain CXR given duration of coughing. -if needed will send in abx after CXR results. -given neb in clinic, pulse ox increased to 97% - Plan: DG Chest 2 View, albuterol (PROVENTIL) (2.5 MG/3ML) 0.083% nebulizer solution 2.5 mg, HYDROcodone-homatropine (HYCODAN) 5-1.5 MG/5ML syrup, benzonatate (TESSALON) 100 MG capsule -given RTC or ED precautions  Grier Mitts, MD

## 2017-08-05 ENCOUNTER — Other Ambulatory Visit: Payer: Self-pay | Admitting: Rheumatology

## 2017-08-08 NOTE — Telephone Encounter (Signed)
Last Visit: 07/05/17 Next Visit: 12/13/17 Labs: 06/24/17 Glucose is 119. All other labs are WNL.  Okay to refill per Dr. Estanislado Pandy

## 2017-08-31 ENCOUNTER — Other Ambulatory Visit: Payer: Self-pay | Admitting: Rheumatology

## 2017-09-01 NOTE — Telephone Encounter (Signed)
Last visit: 07/05/2017 Next visit: 12/13/2017 Labs: 06/24/2017 Glucose is 119. All other labs are WNL.  Okay to refill per Dr. Estanislado Pandy.

## 2017-09-05 ENCOUNTER — Other Ambulatory Visit: Payer: Self-pay | Admitting: Rheumatology

## 2017-09-05 NOTE — Telephone Encounter (Signed)
Last Visit: 07/05/17 Next Visit: 12/13/17  Okay to refill per Dr. Estanislado Pandy

## 2017-09-22 ENCOUNTER — Telehealth: Payer: Self-pay

## 2017-09-22 NOTE — Telephone Encounter (Signed)
Error

## 2017-09-28 ENCOUNTER — Telehealth: Payer: Self-pay | Admitting: Rheumatology

## 2017-09-28 NOTE — Telephone Encounter (Signed)
Patient left a voicemail requesting prescription refill of Tramadol to be sent to [facility] on [address] in [place].

## 2017-09-28 NOTE — Telephone Encounter (Signed)
Last Visit: 07/05/17 Next Visit: 12/13/17 UDS: 09/07/16 Narc Agreement:

## 2017-09-29 ENCOUNTER — Telehealth: Payer: Self-pay | Admitting: Rheumatology

## 2017-09-29 NOTE — Telephone Encounter (Signed)
Patient was returning your call. Per patient, she is not able to answer her phone at work, but you can try her desk phone. She may need to call you back this pm.

## 2017-09-29 NOTE — Telephone Encounter (Signed)
Patient is due to update narc agreement and UDS.  Attempted to contact the patient and left message for patient to call the office.

## 2017-09-29 NOTE — Telephone Encounter (Signed)
Attempted to contact patient on mobile number and work number. Left message for patient to call the office.

## 2017-09-30 ENCOUNTER — Telehealth: Payer: Self-pay | Admitting: Rheumatology

## 2017-09-30 ENCOUNTER — Other Ambulatory Visit: Payer: Self-pay | Admitting: *Deleted

## 2017-09-30 DIAGNOSIS — Z5181 Encounter for therapeutic drug level monitoring: Secondary | ICD-10-CM | POA: Diagnosis not present

## 2017-09-30 DIAGNOSIS — Z79899 Other long term (current) drug therapy: Secondary | ICD-10-CM | POA: Diagnosis not present

## 2017-09-30 MED ORDER — TRAMADOL HCL 50 MG PO TABS: ORAL_TABLET | ORAL | 0 refills | Status: DC

## 2017-09-30 NOTE — Progress Notes (Signed)
Last Visit: 07/05/17 Next Visit: 12/13/17 UDS: 09/07/16 Narc Agreement: 09/30/17  Patient is going to lab to update UDS.  Okay to refill 30 day supply Tramadol?

## 2017-09-30 NOTE — Progress Notes (Signed)
Ok to refill.  Please ensure UDS is completed ASAP.

## 2017-09-30 NOTE — Telephone Encounter (Signed)
Patient left a voicemail stating she was returning your call.   

## 2017-09-30 NOTE — Progress Notes (Signed)
Patient going to [facility] today for UDS.

## 2017-09-30 NOTE — Telephone Encounter (Signed)
Patient states she stopped office. Patient has had UDS and labs. Patient advised prescription has been sent to the pharmacy.

## 2017-10-01 LAB — CBC WITH DIFFERENTIAL/PLATELET
Basophils Absolute: 0.1 10*3/uL (ref 0.0–0.2)
Basos: 1 %
EOS (ABSOLUTE): 0.2 10*3/uL (ref 0.0–0.4)
Eos: 3 %
Hematocrit: 44.7 % (ref 34.0–46.6)
Hemoglobin: 14.3 g/dL (ref 11.1–15.9)
Immature Grans (Abs): 0 10*3/uL (ref 0.0–0.1)
Immature Granulocytes: 0 %
Lymphocytes Absolute: 3.1 10*3/uL (ref 0.7–3.1)
Lymphs: 45 %
MCH: 30.8 pg (ref 26.6–33.0)
MCHC: 32 g/dL (ref 31.5–35.7)
MCV: 96 fL (ref 79–97)
Monocytes Absolute: 0.7 10*3/uL (ref 0.1–0.9)
Monocytes: 10 %
Neutrophils Absolute: 2.9 10*3/uL (ref 1.4–7.0)
Neutrophils: 41 %
Platelets: 363 10*3/uL (ref 150–450)
RBC: 4.65 x10E6/uL (ref 3.77–5.28)
RDW: 13.7 % (ref 12.3–15.4)
WBC: 6.9 10*3/uL (ref 3.4–10.8)

## 2017-10-01 LAB — CMP14+EGFR
ALT: 10 IU/L (ref 0–32)
AST: 16 IU/L (ref 0–40)
Albumin/Globulin Ratio: 2.1 (ref 1.2–2.2)
Albumin: 4.8 g/dL (ref 3.6–4.8)
Alkaline Phosphatase: 67 IU/L (ref 39–117)
BUN/Creatinine Ratio: 18 (ref 12–28)
BUN: 12 mg/dL (ref 8–27)
Bilirubin Total: <0.2 mg/dL (ref 0.0–1.2)
CO2: 25 mmol/L (ref 20–29)
Calcium: 10 mg/dL (ref 8.7–10.3)
Chloride: 102 mmol/L (ref 96–106)
Creatinine, Ser: 0.68 mg/dL (ref 0.57–1.00)
GFR calc Af Amer: 107 mL/min/{1.73_m2} (ref >59)
GFR calc non Af Amer: 93 mL/min/{1.73_m2} (ref >59)
Globulin, Total: 2.3 g/dL (ref 1.5–4.5)
Glucose: 99 mg/dL (ref 65–99)
Potassium: 3.8 mmol/L (ref 3.5–5.2)
Sodium: 145 mmol/L — ABNORMAL HIGH (ref 134–144)
Total Protein: 7.1 g/dL (ref 6.0–8.5)

## 2017-10-02 LAB — DRUG PROFILE, UR, 9 DRUGS (LABCORP)
Amphetamines, Urine: NEGATIVE ng/mL
Barbiturate Quant, Ur: NEGATIVE ng/mL
Benzodiazepine Quant, Ur: NEGATIVE ng/mL
Cannabinoid Quant, Ur: NEGATIVE ng/mL
Cocaine (Metab.): NEGATIVE ng/mL
Methadone Screen, Urine: NEGATIVE ng/mL
Opiate Quant, Ur: NEGATIVE ng/mL
PCP Quant, Ur: NEGATIVE ng/mL
Propoxyphene: NEGATIVE ng/mL

## 2017-10-03 NOTE — Progress Notes (Signed)
UDS was not obtained for tramadol.  Okay to refill her tramadol.  Please order next UDS for tramadol.

## 2017-10-10 ENCOUNTER — Other Ambulatory Visit: Payer: Self-pay | Admitting: Rheumatology

## 2017-10-10 NOTE — Telephone Encounter (Signed)
Last Visit: 07/05/17 Next Visit: 12/13/17 Labs: 09/30/17 WNL  Okay to refill per Dr. Estanislado Pandy

## 2017-11-14 ENCOUNTER — Other Ambulatory Visit: Payer: Self-pay | Admitting: Rheumatology

## 2017-11-15 ENCOUNTER — Ambulatory Visit: Payer: 59 | Admitting: Family Medicine

## 2017-11-15 ENCOUNTER — Encounter: Payer: Self-pay | Admitting: Family Medicine

## 2017-11-15 VITALS — BP 124/76 | HR 97 | Temp 98.3°F | Ht 68.0 in | Wt 219.8 lb

## 2017-11-15 DIAGNOSIS — M159 Polyosteoarthritis, unspecified: Secondary | ICD-10-CM | POA: Insufficient documentation

## 2017-11-15 DIAGNOSIS — Z1231 Encounter for screening mammogram for malignant neoplasm of breast: Secondary | ICD-10-CM | POA: Diagnosis not present

## 2017-11-15 DIAGNOSIS — E782 Mixed hyperlipidemia: Secondary | ICD-10-CM

## 2017-11-15 DIAGNOSIS — M150 Primary generalized (osteo)arthritis: Secondary | ICD-10-CM

## 2017-11-15 DIAGNOSIS — K219 Gastro-esophageal reflux disease without esophagitis: Secondary | ICD-10-CM

## 2017-11-15 DIAGNOSIS — I10 Essential (primary) hypertension: Secondary | ICD-10-CM | POA: Diagnosis not present

## 2017-11-15 DIAGNOSIS — M0579 Rheumatoid arthritis with rheumatoid factor of multiple sites without organ or systems involvement: Secondary | ICD-10-CM

## 2017-11-15 DIAGNOSIS — E038 Other specified hypothyroidism: Secondary | ICD-10-CM

## 2017-11-15 DIAGNOSIS — Z8669 Personal history of other diseases of the nervous system and sense organs: Secondary | ICD-10-CM

## 2017-11-15 DIAGNOSIS — R7309 Other abnormal glucose: Secondary | ICD-10-CM

## 2017-11-15 NOTE — Progress Notes (Signed)
Subjective:    Patient ID: Cheryl Knight, female    DOB: 03-08-1955, 62 y.o.   MRN: [id number]  HPI   Patient presents to clinic as [facility] form Dr Sherren Mocha because he is retiring.   BP currently controlled on lisinopril HCTZ  Hypothyroidism treated with levothyrozine 100 mcg  She has chronic pain related to RA and OA, sees rheumatology for pain management.   GERD controlled with nexium.   Lipid controlled with pravastatin.   Uses topamax for migraine prevention and imitrex as needed - has not needed to use in a while.    Patient Active Problem List   Diagnosis Date Noted  . Elevated hemoglobin A1c 11/15/2017  . Primary osteoarthritis involving multiple joints 11/15/2017  . Moderate asthma with acute exacerbation 05/03/2017  . Routine general medical examination at a health care facility 02/08/2017  . Primary osteoarthritis of both feet 02/10/2016  . Hx of migraine headaches 02/07/2016  . History of hypothyroidism 02/07/2016  . History of gastroesophageal reflux (GERD) 02/07/2016  . Obese 12/30/2015  . Basal cell carcinoma of chest wall 10/05/2011  . Hypertension 09/01/2010  . Hyperlipidemia 02/27/2008  . Hypothyroidism 02/21/2007  . Migraine 02/21/2007  . VAGINITIS, ATROPHIC 02/21/2007  . Psoriasis 02/21/2007  . Sicca syndrome, unspecified ([place]) 02/21/2007  . GERD 06/29/2006  . Rheumatoid arthritis ([place]) 06/29/2006   Social History   Tobacco Use  . Smoking status: Light Tobacco Smoker    Types: Cigarettes  . Smokeless tobacco: Never Used  . Tobacco comment: socially  Substance Use Topics  . Alcohol use: Yes    Comment: socially    Family History  Problem Relation Age of Onset  . Hypertension Mother   . Cancer Mother   . Thyroid disease Mother   . Cancer Father   . Stroke Maternal Grandmother    Past Surgical History:  Procedure Laterality Date  . ABDOMINAL HYSTERECTOMY    . TONSILLECTOMY    . TUBAL LIGATION     Review of Systems  Constitutional:  Negative for chills, fatigue and fever.  HENT: Negative for congestion, ear pain, sinus pain and sore throat.   Eyes: Negative.   Respiratory: Negative for cough, shortness of breath and wheezing.   Cardiovascular: Negative for chest pain, palpitations and leg swelling.  Gastrointestinal: Negative for abdominal pain, diarrhea, nausea and vomiting.  Genitourinary: Negative for dysuria, frequency and urgency.  Musculoskeletal: Chronic joint pains.  Skin: Negative for color change, pallor and rash.  Neurological: Negative for syncope, light-headedness and headaches.  Psychiatric/Behavioral: The patient is not nervous/anxious.       Objective:   Physical Exam  Constitutional: She is oriented to person, place, and time. She appears well-nourished. No distress.  HENT:  Head: Normocephalic and atraumatic.  Eyes: Conjunctivae and EOM are normal. No scleral icterus.  Neck: Neck supple. No tracheal deviation present.  Cardiovascular: Normal rate and regular rhythm.  Pulmonary/Chest: Effort normal and breath sounds normal. No respiratory distress.  Abdominal: Soft. Bowel sounds are normal. She exhibits no distension.  Musculoskeletal: She exhibits no edema.  Stiff with changing position, especially in knees.   Neurological: She is alert and oriented to person, place, and time.  Skin: Skin is warm and dry. Capillary refill takes less than 2 seconds.  Psychiatric: She has a normal mood and affect. Her behavior is normal.  Nursing note and vitals reviewed.     Vitals:   11/15/17 0815  BP: 124/76  Pulse: 97  Temp: 98.3 F (36.8 C)  SpO2: 93%   Assessment & Plan:   HTN -- BP stable  Lipids -- Will continue pravastatin  Migraine -- Continue topamax and imitrex PRN  Hypothyroid - Will plan to recheck thyroid with labs  GERD - Stable on nexium  RA/OA -- Chronic pain, continue to see rheumatology  Hx of elevated A1c in June 2019 - will repeat fasting lab work at her CPE in 2 weeks.     Patient would like mammogram order to work on getting this scheduled.    CPE in 2 weeks.

## 2017-11-16 ENCOUNTER — Other Ambulatory Visit: Payer: Self-pay | Admitting: Rheumatology

## 2017-11-16 NOTE — Telephone Encounter (Signed)
Last Visit: 07/05/17 Next Visit: 12/13/17  Okay to refill per Dr. Estanislado Pandy

## 2017-11-18 ENCOUNTER — Telehealth: Payer: Self-pay | Admitting: Rheumatology

## 2017-11-18 MED ORDER — METHOTREXATE SODIUM CHEMO INJECTION (PF) 50 MG/2ML: INTRAMUSCULAR | 0 refills | Status: DC

## 2017-11-18 NOTE — Telephone Encounter (Signed)
Patient called stating she is due to take her injection tomorrow and is out of her prescription of Methotrexate.  Patient states [facility] is still waiting for the refill request of Methotrexate, as well as the syringes to be sent to them.

## 2017-11-18 NOTE — Telephone Encounter (Signed)
Contacted pharmacy and spoke with Merrily Pew, he states they have been giving patient 5 2 mL vial preservative free and that the patient has been only getting 5 doses out of that. Advised Josh that we want the patient to get 5 2 mL vials with preservatives. Gave verbal authorization to refill.

## 2017-11-20 ENCOUNTER — Other Ambulatory Visit: Payer: Self-pay | Admitting: Physician Assistant

## 2017-11-21 NOTE — Telephone Encounter (Signed)
Last Visit: 07/05/17 Next Visit: 12/13/17 UDS:09/30/17 Narc Agreement:09/30/17  Okay to refill Tramadol?

## 2017-11-21 NOTE — Telephone Encounter (Signed)
Ok to refill 

## 2017-11-25 ENCOUNTER — Telehealth: Payer: Self-pay

## 2017-11-25 NOTE — Telephone Encounter (Signed)
Copied from [place] [id number]. Topic: Appointment Scheduling - Scheduling Inquiry for Clinic >> Nov 25, 2017 11:09 AM Cheryl Knight wrote: Reason for CRM: pt is schedule for a physical on 11-29-17 with lauren.  Pt is not due for physical until after 02-08-2018. Pt had last physical with dr Sherren Knight on 02-08-17.Please confirm and call pt back  **I called patient back & changed physical to 02/09/18 one year, one day after last one done by Cheryl Knight.**

## 2017-11-29 ENCOUNTER — Encounter: Payer: 59 | Admitting: Family Medicine

## 2017-11-29 NOTE — Progress Notes (Signed)
Office Visit Note  Patient: Cheryl Knight             Date of Birth: 1955-05-21           MRN: [id number]             PCP: Jodelle Green, FNP Referring: Dorena Cookey, MD Visit Date: 12/13/2017 Occupation: @GUAROCC @  Subjective:  Right ankle pain    History of Present Illness: Cheryl Knight is a 62 y.o. female with history of seropositive rheumatoid arthritis, sicca symptoms, and osteoarthritis. Patient is on MTX 0.8 ml sq once weekly and folic acid 2 mg by mouth daily.  She has been having increased joint pain with recent weather changes.  She has been having increased pain in bilateral knee joints worse in the right knee.  She is also been experiencing right ankle pain.  She denies any joint swelling at this time.  She states that the pain is an 8 out of 10 when she first gets up in the morning and her right ankle.  She states the pain started about 1 month ago and states that she has to climib more steps at work recently.  She has been having to take tramadol 50 mg 1 tablet by mouth in the morning 1 tablet by mouth at bedtime.  She was previously having to take tramadol 1 tablet in the morning.  She continues to have sicca symptoms and takes pilocarpine 5 mg as needed.  Activities of Daily Living:  Patient reports morning stiffness all day.   Patient Denies nocturnal pain.  Difficulty dressing/grooming: Reports Difficulty climbing stairs: Reports Difficulty getting out of chair: Reports Difficulty using hands for taps, buttons, cutlery, and/or writing: Denies  Review of Systems  Constitutional: Positive for fatigue.  HENT: Positive for mouth dryness. Negative for mouth sores and nose dryness.   Eyes: Positive for dryness. Negative for pain and visual disturbance.  Respiratory: Negative for cough, hemoptysis, shortness of breath and difficulty breathing.   Cardiovascular: Negative for chest pain, palpitations, hypertension and swelling in legs/feet.  Gastrointestinal:  Positive for constipation. Negative for blood in stool and diarrhea.  Endocrine: Negative for increased urination.  Genitourinary: Negative for difficulty urinating and painful urination.  Musculoskeletal: Positive for arthralgias, joint pain, morning stiffness and muscle tenderness. Negative for joint swelling, myalgias, muscle weakness and myalgias.  Skin: Negative for color change, pallor, rash, hair loss, nodules/bumps, skin tightness, ulcers and sensitivity to sunlight.  Allergic/Immunologic: Negative for susceptible to infections.  Neurological: Negative for dizziness, numbness, headaches and weakness.  Hematological: Negative for bruising/bleeding tendency and swollen glands.  Psychiatric/Behavioral: Negative for depressed mood and sleep disturbance. The patient is not nervous/anxious.     PMFS History:  Patient Active Problem List   Diagnosis Date Noted  . Elevated hemoglobin A1c 11/15/2017  . Primary osteoarthritis involving multiple joints 11/15/2017  . Moderate asthma with acute exacerbation 05/03/2017  . Routine general medical examination at a health care facility 02/08/2017  . Primary osteoarthritis of both feet 02/10/2016  . Hx of migraine headaches 02/07/2016  . History of hypothyroidism 02/07/2016  . History of gastroesophageal reflux (GERD) 02/07/2016  . Obese 12/30/2015  . Basal cell carcinoma of chest wall 10/05/2011  . Hypertension 09/01/2010  . Hyperlipidemia 02/27/2008  . Hypothyroidism 02/21/2007  . Migraine 02/21/2007  . VAGINITIS, ATROPHIC 02/21/2007  . Psoriasis 02/21/2007  . Sicca syndrome, unspecified ([place]) 02/21/2007  . GERD 06/29/2006  . Rheumatoid arthritis ([place]) 06/29/2006    Past Medical  History:  Diagnosis Date  . High cholesterol   . Hypertension   . Irritable bowel   . Rheumatoid arthritis ([place])   . Thyroid disease     Family History  Problem Relation Age of Onset  . Hypertension Mother   . Cancer Mother   . Thyroid disease Mother     . Cancer Father   . Stroke Maternal Grandmother    Past Surgical History:  Procedure Laterality Date  . ABDOMINAL HYSTERECTOMY    . TONSILLECTOMY    . TUBAL LIGATION     Social History   Social History Narrative  . Not on file    Objective: Vital Signs: BP 113/66 (BP Location: Left Arm, Patient Position: Sitting, Cuff Size: Normal)   Pulse (!) 102   Resp 16   Ht 5\' 8"  (1.727 m)   Wt 221 lb (100.2 kg)   BMI 33.60 kg/m    Physical Exam  Constitutional: She is oriented to person, place, and time. She appears well-developed and well-nourished.  HENT:  Head: Normocephalic and atraumatic.  Eyes: Conjunctivae and EOM are normal.  Neck: Normal range of motion.  Cardiovascular: Normal rate, regular rhythm, normal heart sounds and intact distal pulses.  Pulmonary/Chest: Effort normal and breath sounds normal.  Abdominal: Soft. Bowel sounds are normal.  Lymphadenopathy:    She has no cervical adenopathy.  Neurological: She is alert and oriented to person, place, and time.  Skin: Skin is warm and dry. Capillary refill takes less than 2 seconds.  Psychiatric: She has a normal mood and affect. Her behavior is normal.  Nursing note and vitals reviewed.    Musculoskeletal Exam: C-spine slightly limited ROM with discomfort. Thoracic spine and lumbar spine good ROM.  No midline spinal tenderness.  No SI joint tenderness.  Shoulder joints, elbow joints, wrist joints, MCPs, PIPs, and DIPs good ROM with no synovitis.  Complete fist formation bilaterally. Hip joints, knee joints, ankle joints, MTPs, PIPs, and DIPs good ROM with no synovitis.  No warmth or effusion of knee joints.  Bilateral knee crepitus. Tenderness of right subtalar joint.   CDAI Exam: CDAI Score: 1.2  Patient Global Assessment: 6 (mm); Provider Global Assessment: 6 (mm) Swollen: 0 ; Tender: 1  Joint Exam      Right  Left  Subtalar   Tender        Investigation: No additional findings.  Imaging: No results  found.  Recent Labs: Lab Results  Component Value Date   WBC 6.9 09/30/2017   HGB 14.3 09/30/2017   PLT 363 09/30/2017   NA 145 (H) 09/30/2017   K 3.8 09/30/2017   CL 102 09/30/2017   CO2 25 09/30/2017   GLUCOSE 99 09/30/2017   BUN 12 09/30/2017   CREATININE 0.68 09/30/2017   BILITOT <0.2 09/30/2017   ALKPHOS 67 09/30/2017   AST 16 09/30/2017   ALT 10 09/30/2017   PROT 7.1 09/30/2017   ALBUMIN 4.8 09/30/2017   CALCIUM 10.0 09/30/2017   GFRAA 107 09/30/2017    Speciality Comments: No specialty comments available.  Procedures:  No procedures performed Allergies: Meperidine hcl and Pentazocine lactate     Assessment / Plan:     Visit Diagnoses: Rheumatoid arthritis involving multiple sites with positive rheumatoid factor ([place]) - +RF: She has no synovitis on exam.  She has right subtalar joint tenderness on exam.  She has been experiencing increased arthralgias with the recent weather changes.  She continues to inject MTX 0.8 ml sq once weekly and  takes folic acid 2 mg by mouth daily.  She declined a right subtalar joint cortisone injection today.  A prescription for prednisone starting at 20 mg and tapering by 5 mg every 2 days was sent to the pharmacy.  She was advised to notify us if she continues to have right ankle pain after completing the taper. She does not need any refills at this time.  She will return for lab work in December and every 3 months. She will follow up in 5 months.   High risk medication use - Methotrexate 7.0JG per week, folic acid 2 mg a day.  Most recent CBC/CMP within normal limits on 09/30/17.  Next CBC/CMP due in December and then every 3 months.  Standing orders are in place.  tramadol.UDS: 09/30/2017 (tramadol portion not completed)Narc agreement: 09/30/2017.   Psoriasis: She has no active psoriasis at this time.   Primary osteoarthritis of both knees -No warmth or effusion of knee joints. Good ROM with some discomfort.  She has bilateral knee crepitus.    Primary osteoarthritis of both feet: She has PIP and DIP synovial thickening consistent with osteoarthritis of both feet. She has tenderness of the right subtalar joint on exam. No synovitis noted.  We discussed wearing a right ankle compression sleeve.   She will also be started on short prednisone taper. She wears proper fitting shoes.   Sicca syndrome ([place]) - She continues to have sicca symptoms.  She has no partoid swelling on exam.  She takes pilocarpine 5 mg 1 tablet by mouth PRN. She does not need a refill at this time.   Other medical conditions are listed as follows:   History of migraine  History of hypothyroidism  History of hyperlipidemia  History of gastroesophageal reflux (GERD)   Orders: No orders of the defined types were placed in this encounter.  Meds ordered this encounter  Medications  . predniSONE (DELTASONE) 5 MG tablet    Sig: Take 4 tablets by mouth daily for 2 days, 3 tablets by mouth for 2 days, 2 tablets by mouth for 2 days, 1 tablet by mouth for 2 days.    Dispense:  20 tablet    Refill:  0    Face-to-face time spent with patient was 30 minutes. Greater than 50% of time was spent in counseling and coordination of care.  Follow-Up Instructions: Return in about 5 months (around 05/14/2018) for Rheumatoid arthritis, Osteoarthritis.   Ofilia Neas, PA-C   I examined and evaluated the patient with Hazel Sams PA.  Patient had no synovitis on my examination.  She appears to have some discomfort in the subtalar joint of her right ankle.  She declined cortisone injection.  We will try prednisone taper for right now.  The plan of care was discussed as noted above.  Bo Merino, MD  Note - This record has been created using Editor, commissioning.  Chart creation errors have been sought, but may not always  have been located. Such creation errors do not reflect on  the standard of medical care.

## 2017-12-13 ENCOUNTER — Encounter: Payer: Self-pay | Admitting: Physician Assistant

## 2017-12-13 ENCOUNTER — Ambulatory Visit: Payer: 59 | Admitting: Rheumatology

## 2017-12-13 VITALS — BP 113/66 | HR 102 | Resp 16 | Ht 68.0 in | Wt 221.0 lb

## 2017-12-13 DIAGNOSIS — M19071 Primary osteoarthritis, right ankle and foot: Secondary | ICD-10-CM

## 2017-12-13 DIAGNOSIS — M19072 Primary osteoarthritis, left ankle and foot: Secondary | ICD-10-CM

## 2017-12-13 DIAGNOSIS — Z79899 Other long term (current) drug therapy: Secondary | ICD-10-CM

## 2017-12-13 DIAGNOSIS — M170 Bilateral primary osteoarthritis of knee: Secondary | ICD-10-CM | POA: Diagnosis not present

## 2017-12-13 DIAGNOSIS — Z8639 Personal history of other endocrine, nutritional and metabolic disease: Secondary | ICD-10-CM

## 2017-12-13 DIAGNOSIS — M0579 Rheumatoid arthritis with rheumatoid factor of multiple sites without organ or systems involvement: Secondary | ICD-10-CM

## 2017-12-13 DIAGNOSIS — Z8669 Personal history of other diseases of the nervous system and sense organs: Secondary | ICD-10-CM

## 2017-12-13 DIAGNOSIS — L409 Psoriasis, unspecified: Secondary | ICD-10-CM | POA: Diagnosis not present

## 2017-12-13 DIAGNOSIS — M3500 Sicca syndrome, unspecified: Secondary | ICD-10-CM

## 2017-12-13 DIAGNOSIS — Z8719 Personal history of other diseases of the digestive system: Secondary | ICD-10-CM

## 2017-12-13 MED ORDER — PREDNISONE 5 MG PO TABS: ORAL_TABLET | ORAL | 0 refills | Status: DC

## 2017-12-13 NOTE — Patient Instructions (Signed)
Standing Labs We placed an order today for your standing lab work.    Please come back and get your standing labs in December and every 3 months   We have open lab Monday through Friday from 8:30-11:30 AM and 1:30-4:00 PM  at the office of Dr. Shaili Deveshwar.   You may experience shorter wait times on Monday and Friday afternoons. The office is located at [address], [address], [place], [place] [address] No appointment is necessary.   Labs are drawn by [facility].  You may receive a bill from [facility] for your lab work. If you have any questions regarding directions or hours of operation,  please call [phone].   Just as a reminder please drink plenty of water prior to coming for your lab work. Thanks!   

## 2017-12-16 ENCOUNTER — Other Ambulatory Visit: Payer: Self-pay | Admitting: Physician Assistant

## 2017-12-19 NOTE — Telephone Encounter (Signed)
Last visit: 12/13/17 Next Visit: 05/23/18   Okay to refill per Dr. Estanislado Pandy

## 2017-12-30 ENCOUNTER — Telehealth: Payer: Self-pay | Admitting: Rheumatology

## 2017-12-30 DIAGNOSIS — Z79899 Other long term (current) drug therapy: Secondary | ICD-10-CM

## 2017-12-30 NOTE — Telephone Encounter (Signed)
Lab Orders Were released

## 2017-12-30 NOTE — Telephone Encounter (Signed)
Left message to advise patient she may come by office to pick up handicap placard.

## 2017-12-30 NOTE — Telephone Encounter (Signed)
Patient called requesting her labwork orders be sent to [facility] on [address].  Patient states she would like to go this afternoon.  Patient states she also forgot to pick up her handicap placard at her last appointment.

## 2018-01-06 DIAGNOSIS — Z79899 Other long term (current) drug therapy: Secondary | ICD-10-CM | POA: Diagnosis not present

## 2018-01-07 LAB — CMP14+EGFR
ALT: 12 IU/L (ref 0–32)
AST: 15 IU/L (ref 0–40)
Albumin/Globulin Ratio: 1.9 (ref 1.2–2.2)
Albumin: 4.5 g/dL (ref 3.6–4.8)
Alkaline Phosphatase: 67 IU/L (ref 39–117)
BUN/Creatinine Ratio: 19 (ref 12–28)
BUN: 13 mg/dL (ref 8–27)
Bilirubin Total: <0.2 mg/dL (ref 0.0–1.2)
CO2: 23 mmol/L (ref 20–29)
Calcium: 9.8 mg/dL (ref 8.7–10.3)
Chloride: 102 mmol/L (ref 96–106)
Creatinine, Ser: 0.69 mg/dL (ref 0.57–1.00)
GFR calc Af Amer: 106 mL/min/{1.73_m2} (ref >59)
GFR calc non Af Amer: 92 mL/min/{1.73_m2} (ref >59)
Globulin, Total: 2.4 g/dL (ref 1.5–4.5)
Glucose: 184 mg/dL — ABNORMAL HIGH (ref 65–99)
Potassium: 3.9 mmol/L (ref 3.5–5.2)
Sodium: 140 mmol/L (ref 134–144)
Total Protein: 6.9 g/dL (ref 6.0–8.5)

## 2018-01-07 LAB — CBC WITH DIFFERENTIAL/PLATELET
Basophils Absolute: 0.1 10*3/uL (ref 0.0–0.2)
Basos: 1 %
EOS (ABSOLUTE): 0.1 10*3/uL (ref 0.0–0.4)
Eos: 1 %
Hematocrit: 40.7 % (ref 34.0–46.6)
Hemoglobin: 14 g/dL (ref 11.1–15.9)
Immature Grans (Abs): 0.1 10*3/uL (ref 0.0–0.1)
Immature Granulocytes: 1 %
Lymphocytes Absolute: 1.6 10*3/uL (ref 0.7–3.1)
Lymphs: 23 %
MCH: 32 pg (ref 26.6–33.0)
MCHC: 34.4 g/dL (ref 31.5–35.7)
MCV: 93 fL (ref 79–97)
Monocytes Absolute: 0.3 10*3/uL (ref 0.1–0.9)
Monocytes: 4 %
Neutrophils Absolute: 4.7 10*3/uL (ref 1.4–7.0)
Neutrophils: 70 %
Platelets: 363 10*3/uL (ref 150–450)
RBC: 4.37 x10E6/uL (ref 3.77–5.28)
RDW: 13.2 % (ref 12.3–15.4)
WBC: 6.8 10*3/uL (ref 3.4–10.8)

## 2018-01-24 ENCOUNTER — Other Ambulatory Visit: Payer: Self-pay | Admitting: Family Medicine

## 2018-01-24 MED ORDER — LEVOTHYROXINE SODIUM 100 MCG PO TABS: 100.0000 ug | ORAL_TABLET | Freq: Every day | ORAL | 0 refills | Status: DC

## 2018-01-24 NOTE — Telephone Encounter (Signed)
Requested Prescriptions  Pending Prescriptions Disp Refills  . levothyroxine (SYNTHROID, LEVOTHROID) 100 MCG tablet 30 tablet 0    Sig: Take 1 tablet (100 mcg total) by mouth daily.     Endocrinology:  Hypothyroid Agents Failed - 01/24/2018 11:43 AM      Failed - TSH needs to be rechecked within 3 months after an abnormal result. Refill until TSH is due.      Passed - TSH in normal range and within 360 days    TSH  Date Value Ref Range Status  02/08/2017 2.10 0.35 - 4.50 uIU/mL Final         Passed - Valid encounter within last 12 months    Recent Outpatient Visits          2 months ago Essential hypertension   [facility] Guse, Jacquelynn Cree, FNP   6 months ago Cough   [place] [place] [facility] [place], MD   8 months ago Moderate asthma with acute exacerbation, unspecified whether persistent   [facility]ist[facility] at [place], MD   8 months ago Moderate asthma with acute exacerbation, unspecified whether persistent   Therapist[facility] at [place], MD   11 months ago Hyperlipidemia, unspecified hyperlipidemia type   Therapist, [facility] at [place], MD      Future Appointments            In 3 weeks Guse, Jacquelynn Cree, [facility], [place]   In 3 months [place] [facility]

## 2018-01-24 NOTE — Telephone Encounter (Signed)
Signed in error  °

## 2018-01-24 NOTE — Telephone Encounter (Signed)
Copied from [place] [id number]. Topic: Quick Communication - Rx Refill/Question >> Jan 24, 2018 10:18 AM Virl Axe D wrote: Medication: levothyroxine (SYNTHROID, LEVOTHROID) 100 MCG tablet / Pt would like to know if PCP Guse would refill until OV on 02/14/18  Has the patient contacted their pharmacy? Yes.   (Agent: If no, request that the patient contact the pharmacy for the refill.) (Agent: If yes, when and what did the pharmacy advise?)  Preferred Pharmacy (with phone number or street name): [facility] [phone] - [place], [place] [address] - [place] AT [address] [phone] (Phone) [phone] (Fax)    Agent: Please be advised that RX refills may take up to 3 business days. We ask that you follow-up with your pharmacy.

## 2018-01-24 NOTE — Addendum Note (Signed)
Addended by: Linus Orn A on: 01/24/2018 11:43 AM   Modules accepted: Orders

## 2018-01-25 DIAGNOSIS — Z85828 Personal history of other malignant neoplasm of skin: Secondary | ICD-10-CM | POA: Diagnosis not present

## 2018-01-25 DIAGNOSIS — D2261 Melanocytic nevi of right upper limb, including shoulder: Secondary | ICD-10-CM | POA: Diagnosis not present

## 2018-01-25 DIAGNOSIS — L918 Other hypertrophic disorders of the skin: Secondary | ICD-10-CM | POA: Diagnosis not present

## 2018-01-25 DIAGNOSIS — L570 Actinic keratosis: Secondary | ICD-10-CM | POA: Diagnosis not present

## 2018-01-30 ENCOUNTER — Other Ambulatory Visit: Payer: Self-pay | Admitting: Physician Assistant

## 2018-01-31 ENCOUNTER — Other Ambulatory Visit: Payer: Self-pay | Admitting: *Deleted

## 2018-01-31 DIAGNOSIS — Z1231 Encounter for screening mammogram for malignant neoplasm of breast: Secondary | ICD-10-CM | POA: Diagnosis not present

## 2018-01-31 DIAGNOSIS — Z803 Family history of malignant neoplasm of breast: Secondary | ICD-10-CM | POA: Diagnosis not present

## 2018-01-31 MED ORDER — METHOTREXATE SODIUM CHEMO INJECTION (PF) 50 MG/2ML: INTRAMUSCULAR | 0 refills | Status: DC

## 2018-01-31 NOTE — Telephone Encounter (Signed)
Last visit: 12/13/17 Next Visit: 05/23/18  Labs: 01/06/18 Glucose is elevated-184. Rest of CMP WNL. CBC WNL.  Okay to refill per Dr. Estanislado Pandy

## 2018-01-31 NOTE — Telephone Encounter (Signed)
Last visit: 12/13/17 Next Visit: 05/23/18  UDS: 09/30/17 Narc Agreement: 09/30/17  Okay to refill Tramadol?

## 2018-02-14 ENCOUNTER — Ambulatory Visit (INDEPENDENT_AMBULATORY_CARE_PROVIDER_SITE_OTHER): Payer: 59 | Admitting: Family Medicine

## 2018-02-14 ENCOUNTER — Encounter: Payer: Self-pay | Admitting: Family Medicine

## 2018-02-14 ENCOUNTER — Other Ambulatory Visit: Payer: Self-pay | Admitting: Family Medicine

## 2018-02-14 VITALS — BP 110/78 | HR 89 | Temp 98.0°F | Resp 18 | Ht 68.0 in | Wt 221.0 lb

## 2018-02-14 DIAGNOSIS — Z114 Encounter for screening for human immunodeficiency virus [HIV]: Secondary | ICD-10-CM

## 2018-02-14 DIAGNOSIS — Z1239 Encounter for other screening for malignant neoplasm of breast: Secondary | ICD-10-CM | POA: Diagnosis not present

## 2018-02-14 DIAGNOSIS — Z1159 Encounter for screening for other viral diseases: Secondary | ICD-10-CM

## 2018-02-14 DIAGNOSIS — Z1211 Encounter for screening for malignant neoplasm of colon: Secondary | ICD-10-CM

## 2018-02-14 DIAGNOSIS — Z780 Asymptomatic menopausal state: Secondary | ICD-10-CM

## 2018-02-14 DIAGNOSIS — R7309 Other abnormal glucose: Secondary | ICD-10-CM

## 2018-02-14 DIAGNOSIS — Z0000 Encounter for general adult medical examination without abnormal findings: Secondary | ICD-10-CM | POA: Diagnosis not present

## 2018-02-14 NOTE — Progress Notes (Signed)
Subjective:    Patient ID: Cheryl Knight, female    DOB: [birth year], 63 y.o.   MRN: [id number]  HPI   Patient presents to clinic for annual physical exam.  Overall she is feeling well today and has no complaints.  She does see the eye doctor every year, is planning to have cataract removal surgery sometime this summer.   Sees dentist 2 times per year.  She is working on eating a healthier diet so both she and her husband can lose weight and better control their chronic medical issues.  Patient did have her mammogram in October 2019 at [facility] in [place], results were negative and we will plan for repeat mammogram screening in 1 year.  Patient has had total abdominal hysterectomy.  States she has had DEXA scan in the past, but many years ago, we will get DEXA scan with her today.  Patient believes her last colonoscopy was greater than 10 years ago, we will do colonoscopy referral today.  Patient Active Problem List   Diagnosis Date Noted  . Elevated hemoglobin A1c 11/15/2017  . Primary osteoarthritis involving multiple joints 11/15/2017  . Moderate asthma with acute exacerbation 05/03/2017  . Routine general medical examination at a health care facility 02/08/2017  . Primary osteoarthritis of both feet 02/10/2016  . Hx of migraine headaches 02/07/2016  . History of hypothyroidism 02/07/2016  . History of gastroesophageal reflux (GERD) 02/07/2016  . Obese 12/30/2015  . Basal cell carcinoma of chest wall 10/05/2011  . Hypertension 09/01/2010  . Hyperlipidemia 02/27/2008  . Hypothyroidism 02/21/2007  . Migraine 02/21/2007  . VAGINITIS, ATROPHIC 02/21/2007  . Psoriasis 02/21/2007  . Sicca syndrome, unspecified ([place]) 02/21/2007  . GERD 06/29/2006  . Rheumatoid arthritis ([place]) 06/29/2006   Social History   Tobacco Use  . Smoking status: Light Tobacco Smoker    Types: Cigarettes  . Smokeless tobacco: Never Used  . Tobacco comment: socially  Substance Use Topics  .  Alcohol use: Yes    Comment: socially    Past Surgical History:  Procedure Laterality Date  . ABDOMINAL HYSTERECTOMY    . TONSILLECTOMY    . TUBAL LIGATION     Family History  Problem Relation Age of Onset  . Hypertension Mother   . Cancer Mother   . Thyroid disease Mother   . Cancer Father   . Stroke Maternal Grandmother    Review of Systems  Constitutional: Negative for chills, fatigue and fever.  HENT: Negative for congestion, ear pain, sinus pain and sore throat.   Eyes: Negative.   Respiratory: Negative for cough, shortness of breath and wheezing.   Cardiovascular: Negative for chest pain, palpitations and leg swelling.  Gastrointestinal: Negative for abdominal pain, diarrhea, nausea and vomiting.  Genitourinary: Negative for dysuria, frequency and urgency.  Musculoskeletal: Negative for arthralgias and myalgias.  Skin: Negative for color change, pallor and rash.  Neurological: Negative for syncope, light-headedness and headaches.  Psychiatric/Behavioral: The patient is not nervous/anxious.       Objective:   Physical Exam Vitals signs and nursing note reviewed.  Constitutional:      Appearance: She is well-developed.  HENT:     Head: Normocephalic and atraumatic.     Right Ear: External ear normal.     Left Ear: External ear normal.     Nose: Nose normal.  Eyes:     General: No scleral icterus.       Right eye: No discharge.        Left  eye: No discharge.     Conjunctiva/sclera: Conjunctivae normal.     Pupils: Pupils are equal, round, and reactive to light.  Neck:     Musculoskeletal: Normal range of motion and neck supple.     Trachea: No tracheal deviation.  Cardiovascular:     Rate and Rhythm: Normal rate and regular rhythm.     Heart sounds: Normal heart sounds. No murmur. No friction rub. No gallop.   Pulmonary:     Effort: Pulmonary effort is normal. No respiratory distress.     Breath sounds: Normal breath sounds. No wheezing or rales.  Chest:      Chest wall: No tenderness.     Breasts:        Right: Normal. No swelling, bleeding, inverted nipple, mass, nipple discharge, skin change or tenderness.        Left: Normal. No swelling, bleeding, inverted nipple, mass, nipple discharge, skin change or tenderness.  Abdominal:     General: Bowel sounds are normal.     Palpations: Abdomen is soft.     Tenderness: There is no abdominal tenderness. There is no guarding.  Musculoskeletal: Normal range of motion.        General: No deformity.  Lymphadenopathy:     Cervical: No cervical adenopathy.     Upper Body:     Right upper body: No supraclavicular, axillary or pectoral adenopathy.     Left upper body: No supraclavicular, axillary or pectoral adenopathy.  Skin:    General: Skin is warm and dry.     Capillary Refill: Capillary refill takes less than 2 seconds.     Coloration: Skin is not pale.     Findings: No erythema.  Neurological:     Mental Status: She is alert and oriented to person, place, and time.     Cranial Nerves: No cranial nerve deficit.  Psychiatric:        Behavior: Behavior normal.        Thought Content: Thought content normal.    Vitals:   02/14/18 0808  BP: 110/78  Pulse: 89  Resp: 18  Temp: 98 F (36.7 C)  SpO2: 93%      Assessment & Plan:   Well adult exam-overall patient appears to be a healthy & stable 63 year old female.  We will get screening lab work today including HIV and hepatitis C screening.  Referral for colonoscopy given.  Mammogram is up-to-date.  We will get DEXA scan ordered.  Discussed healthy diet and regular physical activity, encouraged lean proteins, lots of vegetables lower sugars and lower carbs, lots of water.  Also encouraged regular physical activity including walking, lifting some small weights.  Patient does wear seatbelt every time she drives.  Also encourage safe sun practices including wearing SPF of at least 30 when outdoors, also can wear long sleeves and a wide brim hat  for better protection from the sun.  Patient will follow-up in approximately 6 months for management of other chronic medical conditions.  She is aware she can return to clinic sooner if any issues arise.

## 2018-02-15 LAB — COMPREHENSIVE METABOLIC PANEL
ALT: 12 IU/L (ref 0–32)
AST: 19 IU/L (ref 0–40)
Albumin/Globulin Ratio: 1.9 (ref 1.2–2.2)
Albumin: 4.4 g/dL (ref 3.8–4.8)
Alkaline Phosphatase: 64 IU/L (ref 39–117)
BUN/Creatinine Ratio: 15 (ref 12–28)
BUN: 11 mg/dL (ref 8–27)
Bilirubin Total: 0.4 mg/dL (ref 0.0–1.2)
CO2: 20 mmol/L (ref 20–29)
Calcium: 10.4 mg/dL — ABNORMAL HIGH (ref 8.7–10.3)
Chloride: 102 mmol/L (ref 96–106)
Creatinine, Ser: 0.74 mg/dL (ref 0.57–1.00)
GFR calc Af Amer: 99 mL/min/{1.73_m2} (ref >59)
GFR calc non Af Amer: 86 mL/min/{1.73_m2} (ref >59)
Globulin, Total: 2.3 g/dL (ref 1.5–4.5)
Glucose: 116 mg/dL — ABNORMAL HIGH (ref 65–99)
Potassium: 3.8 mmol/L (ref 3.5–5.2)
Sodium: 142 mmol/L (ref 134–144)
Total Protein: 6.7 g/dL (ref 6.0–8.5)

## 2018-02-15 LAB — VITAMIN D 25 HYDROXY (VIT D DEFICIENCY, FRACTURES): Vit D, 25-Hydroxy: 29 ng/mL — ABNORMAL LOW (ref 30.0–100.0)

## 2018-02-15 LAB — HIV ANTIBODY (ROUTINE TESTING W REFLEX): HIV Screen 4th Generation wRfx: NONREACTIVE

## 2018-02-15 LAB — CBC
Hematocrit: 40.8 % (ref 34.0–46.6)
Hemoglobin: 14.1 g/dL (ref 11.1–15.9)
MCH: 31.5 pg (ref 26.6–33.0)
MCHC: 34.6 g/dL (ref 31.5–35.7)
MCV: 91 fL (ref 79–97)
Platelets: 349 10*3/uL (ref 150–450)
RBC: 4.47 x10E6/uL (ref 3.77–5.28)
RDW: 13.2 % (ref 11.7–15.4)
WBC: 5.1 10*3/uL (ref 3.4–10.8)

## 2018-02-15 LAB — B12 AND FOLATE PANEL
Folate: >20 ng/mL (ref >3.0)
Vitamin B-12: 336 pg/mL (ref 232–1245)

## 2018-02-15 LAB — LIPID PANEL
Chol/HDL Ratio: 4.7 ratio — ABNORMAL HIGH (ref 0.0–4.4)
Cholesterol, Total: 206 mg/dL — ABNORMAL HIGH (ref 100–199)
HDL: 44 mg/dL (ref >39)
LDL Calculated: 127 mg/dL — ABNORMAL HIGH (ref 0–99)
Triglycerides: 176 mg/dL — ABNORMAL HIGH (ref 0–149)
VLDL Cholesterol Cal: 35 mg/dL (ref 5–40)

## 2018-02-15 LAB — THYROID PANEL WITH TSH
Free Thyroxine Index: 2 (ref 1.2–4.9)
T3 Uptake Ratio: 24 % (ref 24–39)
T4, Total: 8.2 ug/dL (ref 4.5–12.0)
TSH: 1.97 u[IU]/mL (ref 0.450–4.500)

## 2018-02-15 LAB — HEPATITIS C ANTIBODY: Hep C Virus Ab: <0.1 s/co ratio (ref 0.0–0.9)

## 2018-02-15 NOTE — Addendum Note (Signed)
Addended by: Philis Nettle on: 02/15/2018 08:37 AM   Modules accepted: Orders

## 2018-02-20 ENCOUNTER — Encounter: Payer: Self-pay | Admitting: Family Medicine

## 2018-02-22 LAB — SPECIMEN STATUS REPORT

## 2018-02-24 LAB — SPECIMEN STATUS REPORT

## 2018-02-24 LAB — HGB A1C W/O EAG: Hgb A1c MFr Bld: 6.8 % — ABNORMAL HIGH (ref 4.8–5.6)

## 2018-02-27 ENCOUNTER — Encounter: Payer: Self-pay | Admitting: *Deleted

## 2018-03-12 ENCOUNTER — Other Ambulatory Visit: Payer: Self-pay | Admitting: Physician Assistant

## 2018-03-12 ENCOUNTER — Other Ambulatory Visit: Payer: Self-pay | Admitting: Family Medicine

## 2018-03-12 ENCOUNTER — Other Ambulatory Visit: Payer: Self-pay | Admitting: Rheumatology

## 2018-03-13 NOTE — Telephone Encounter (Signed)
Last visit: 12/13/17 Next Visit: 05/23/18 Labs: 02/14/18 Glucose 116, Calcium 10.4  Okay to refill per Dr. Estanislado Pandy

## 2018-03-13 NOTE — Telephone Encounter (Signed)
Last visit: 12/13/17 Next Visit: 05/23/18  UDS: 09/30/17 Narc Agreement: 09/30/17  Okay to refill Tramadol?

## 2018-03-16 ENCOUNTER — Other Ambulatory Visit: Payer: Self-pay | Admitting: Family Medicine

## 2018-03-16 ENCOUNTER — Other Ambulatory Visit: Payer: Self-pay | Admitting: Rheumatology

## 2018-03-16 NOTE — Telephone Encounter (Signed)
Last visit: 12/13/17 Next Visit: 05/23/18  Okay to refill per Dr. Estanislado Pandy

## 2018-03-17 ENCOUNTER — Other Ambulatory Visit: Payer: Self-pay | Admitting: Family Medicine

## 2018-03-17 DIAGNOSIS — Z8669 Personal history of other diseases of the nervous system and sense organs: Secondary | ICD-10-CM

## 2018-04-05 ENCOUNTER — Telehealth: Payer: Self-pay | Admitting: Rheumatology

## 2018-04-05 ENCOUNTER — Other Ambulatory Visit: Payer: Self-pay | Admitting: *Deleted

## 2018-04-05 DIAGNOSIS — Z79899 Other long term (current) drug therapy: Secondary | ICD-10-CM

## 2018-04-05 NOTE — Telephone Encounter (Signed)
Patient going to [facility] in [place] in next two days. Please release orders.

## 2018-04-05 NOTE — Telephone Encounter (Signed)
Lab orders released.  

## 2018-04-15 ENCOUNTER — Other Ambulatory Visit: Payer: Self-pay | Admitting: Family Medicine

## 2018-05-09 ENCOUNTER — Other Ambulatory Visit: Payer: Self-pay | Admitting: Rheumatology

## 2018-05-10 NOTE — Telephone Encounter (Signed)
Last Visit: 12/13/2017 Next Visit: 05/23/2018  Okay to refill per Dr. Estanislado Pandy.

## 2018-05-14 ENCOUNTER — Other Ambulatory Visit: Payer: Self-pay | Admitting: Family Medicine

## 2018-05-15 ENCOUNTER — Other Ambulatory Visit: Payer: Self-pay | Admitting: Physician Assistant

## 2018-05-15 DIAGNOSIS — G8929 Other chronic pain: Secondary | ICD-10-CM

## 2018-05-15 DIAGNOSIS — Z5181 Encounter for therapeutic drug level monitoring: Secondary | ICD-10-CM

## 2018-05-15 NOTE — Telephone Encounter (Signed)
Last Visit: 12/13/2017 Next Visit: 06/21/2018 UDS:09/30/2017  Narc Agreement: 09/30/2017  Last fill: 03/13/2018  Okay to refill tramadol?

## 2018-05-16 ENCOUNTER — Other Ambulatory Visit: Payer: Self-pay | Admitting: Rheumatology

## 2018-05-16 DIAGNOSIS — Z5181 Encounter for therapeutic drug level monitoring: Secondary | ICD-10-CM

## 2018-05-16 DIAGNOSIS — G8929 Other chronic pain: Secondary | ICD-10-CM

## 2018-05-16 MED ORDER — TRAMADOL HCL 50 MG PO TABS: ORAL_TABLET | ORAL | 0 refills | Status: DC

## 2018-05-16 NOTE — Telephone Encounter (Signed)
Patient is going tomorrow to update UDS. Narcotic agreement has been mailed to patient to update.   Last Visit: 12/13/2017 Next Visit: 06/21/2018 UDS:09/30/2017 Narc Agreement: 09/30/2017  Last fill: 03/13/2018  Okay to refill tramadol?

## 2018-05-16 NOTE — Telephone Encounter (Signed)
Patient is due to update UDS and narcotic agreement.

## 2018-05-16 NOTE — Telephone Encounter (Signed)
Attempted to contact patient and left message on machine to advise patient she is due to update UDS and narcotic agreement. Advised patient she would need to go [facility] to complete UDS and she should call so we can release orders. Also advised her we could mail narcotic agreement. Awaiting return call from patient.

## 2018-05-16 NOTE — Telephone Encounter (Signed)
Patient left voicemail stating she was returning Marissa's call.  Patient states you can send the drug screening labwork orders to [facility].  Patient states she was planning to go today.  Patient states she does not like the idea of going to a lab at this time "since I am high risk as you should know."  Patient states she has been  trying to stay at home, but needs her prescription refill of Tramadol.

## 2018-05-16 NOTE — Telephone Encounter (Signed)
ok 

## 2018-05-18 DIAGNOSIS — Z5181 Encounter for therapeutic drug level monitoring: Secondary | ICD-10-CM | POA: Diagnosis not present

## 2018-05-18 DIAGNOSIS — G8929 Other chronic pain: Secondary | ICD-10-CM | POA: Diagnosis not present

## 2018-05-19 NOTE — Telephone Encounter (Signed)
CBC, CMP normal

## 2018-05-20 LAB — CBC WITH DIFFERENTIAL/PLATELET
Basophils Absolute: 0.1 x10E3/uL (ref 0.0–0.2)
Basos: 1 %
EOS (ABSOLUTE): 0.2 x10E3/uL (ref 0.0–0.4)
Eos: 3 %
Hematocrit: 39.9 % (ref 34.0–46.6)
Hemoglobin: 14 g/dL (ref 11.1–15.9)
Immature Grans (Abs): 0 x10E3/uL (ref 0.0–0.1)
Immature Granulocytes: 1 %
Lymphocytes Absolute: 2.7 x10E3/uL (ref 0.7–3.1)
Lymphs: 43 %
MCH: 31.7 pg (ref 26.6–33.0)
MCHC: 35.1 g/dL (ref 31.5–35.7)
MCV: 91 fL (ref 79–97)
Monocytes Absolute: 0.5 x10E3/uL (ref 0.1–0.9)
Monocytes: 9 %
Neutrophils Absolute: 2.7 x10E3/uL (ref 1.4–7.0)
Neutrophils: 43 %
Platelets: 321 x10E3/uL (ref 150–450)
RBC: 4.41 x10E6/uL (ref 3.77–5.28)
RDW: 13 % (ref 11.7–15.4)
WBC: 6.2 x10E3/uL (ref 3.4–10.8)

## 2018-05-20 LAB — CMP14+EGFR
ALT: 13 IU/L (ref 0–32)
AST: 17 IU/L (ref 0–40)
Albumin/Globulin Ratio: 2 (ref 1.2–2.2)
Albumin: 4.4 g/dL (ref 3.8–4.8)
Alkaline Phosphatase: 69 IU/L (ref 39–117)
BUN/Creatinine Ratio: 16 (ref 12–28)
BUN: 14 mg/dL (ref 8–27)
Bilirubin Total: 0.2 mg/dL (ref 0.0–1.2)
CO2: 24 mmol/L (ref 20–29)
Calcium: 10.2 mg/dL (ref 8.7–10.3)
Chloride: 105 mmol/L (ref 96–106)
Creatinine, Ser: 0.89 mg/dL (ref 0.57–1.00)
GFR calc Af Amer: 79 mL/min/1.73
GFR calc non Af Amer: 69 mL/min/1.73
Globulin, Total: 2.2 g/dL (ref 1.5–4.5)
Glucose: 123 mg/dL — ABNORMAL HIGH (ref 65–99)
Potassium: 4 mmol/L (ref 3.5–5.2)
Sodium: 142 mmol/L (ref 134–144)
Total Protein: 6.6 g/dL (ref 6.0–8.5)

## 2018-05-20 LAB — DRUG SCREEN, URINE
Amphetamines, Urine: NEGATIVE ng/mL
Barbiturate screen, urine: NEGATIVE ng/mL
Benzodiazepine Quant, Ur: NEGATIVE ng/mL
Cannabinoid Quant, Ur: NEGATIVE ng/mL
Cocaine (Metab.): NEGATIVE ng/mL
Opiate Quant, Ur: NEGATIVE ng/mL
PCP Quant, Ur: NEGATIVE ng/mL

## 2018-05-20 LAB — TRAMADOL SCREEN, URINE: Tramadol Screen, Urine: POSITIVE ng/mL — AB

## 2018-05-22 ENCOUNTER — Other Ambulatory Visit: Payer: Self-pay | Admitting: Lab

## 2018-05-22 ENCOUNTER — Telehealth: Payer: Self-pay | Admitting: Family Medicine

## 2018-05-22 MED ORDER — CYCLOBENZAPRINE HCL 10 MG PO TABS: 10.0000 mg | ORAL_TABLET | Freq: Every day | ORAL | 1 refills | Status: DC

## 2018-05-22 NOTE — Telephone Encounter (Signed)
No meds in this message  Duplicate?  Flexeril refill sent in

## 2018-05-22 NOTE — Telephone Encounter (Signed)
Copied from [place] [id number]. Topic: Quick Communication - Rx Refill/Question >> May 22, 2018  1:29 PM Blase Mess A wrote: Medication: cyclobenzaprine (FLEXERIL) 10 MG tablet [[id number]]   Has the patient contacted their pharmacy? Yes  (Agent: If no, request that the patient contact the pharmacy for the refill.) (Agent: If yes, when and what did the pharmacy advise?)  Preferred Pharmacy (with phone number or street name):  [facility] [phone] - [address], [address] AT [address] [phone] (Phone) [phone] (Fax)     Agent: Please be advised that RX refills may take up to 3 business days. We ask that you follow-up with your pharmacy.

## 2018-05-23 ENCOUNTER — Ambulatory Visit: Payer: 59 | Admitting: Physician Assistant

## 2018-05-30 ENCOUNTER — Other Ambulatory Visit: Payer: Self-pay | Admitting: Rheumatology

## 2018-05-31 NOTE — Telephone Encounter (Signed)
Last Visit: 12/13/2017 Next Visit: 06/21/2018 Labs: 05/18/18 WNL  Okay to refill per Dr. Estanislado Pandy

## 2018-06-08 NOTE — Progress Notes (Deleted)
Office Visit Note  Patient: Cheryl Knight             Date of Birth: January 30, 1955           MRN: [id number]             PCP: Jodelle Green, FNP Referring: Jodelle Green, FNP Visit Date: 06/21/2018 Occupation: @GUAROCC @  Subjective:  No chief complaint on file.  She is on methotrexate 0.8 mL every 7 days and folic acid 1 mg 2 tablets daily.  Most recent CBC/CMP within normal limits on 05/18/2018 and will monitor every 3 months.  Standing orders placed. Recommend annual influenza, Pneumovax 23, Prevnar 13, and Shingrix as indicated.  History of Present Illness: Cheryl Knight is a 63 y.o. female ***   Activities of Daily Living:  Patient reports morning stiffness for *** {minute/hour:19697}.   Patient {ACTIONS;DENIES/REPORTS:21021675::"Denies"} nocturnal pain.  Difficulty dressing/grooming: {ACTIONS;DENIES/REPORTS:21021675::"Denies"} Difficulty climbing stairs: {ACTIONS;DENIES/REPORTS:21021675::"Denies"} Difficulty getting out of chair: {ACTIONS;DENIES/REPORTS:21021675::"Denies"} Difficulty using hands for taps, buttons, cutlery, and/or writing: {ACTIONS;DENIES/REPORTS:21021675::"Denies"}  No Rheumatology ROS completed.   PMFS History:  Patient Active Problem List   Diagnosis Date Noted  . Elevated hemoglobin A1c 11/15/2017  . Primary osteoarthritis involving multiple joints 11/15/2017  . Moderate asthma with acute exacerbation 05/03/2017  . Routine general medical examination at a health care facility 02/08/2017  . Primary osteoarthritis of both feet 02/10/2016  . Hx of migraine headaches 02/07/2016  . History of hypothyroidism 02/07/2016  . History of gastroesophageal reflux (GERD) 02/07/2016  . Obese 12/30/2015  . Basal cell carcinoma of chest wall 10/05/2011  . Hypertension 09/01/2010  . Hyperlipidemia 02/27/2008  . Hypothyroidism 02/21/2007  . Migraine 02/21/2007  . VAGINITIS, ATROPHIC 02/21/2007  . Psoriasis 02/21/2007  . Sicca syndrome, unspecified ([place])  02/21/2007  . GERD 06/29/2006  . Rheumatoid arthritis ([place]) 06/29/2006    Past Medical History:  Diagnosis Date  . High cholesterol   . Hypertension   . Irritable bowel   . Rheumatoid arthritis ([place])   . Thyroid disease     Family History  Problem Relation Age of Onset  . Hypertension Mother   . Cancer Mother   . Thyroid disease Mother   . Cancer Father   . Stroke Maternal Grandmother    Past Surgical History:  Procedure Laterality Date  . ABDOMINAL HYSTERECTOMY    . TONSILLECTOMY    . TUBAL LIGATION     Social History   Social History Narrative  . Not on file   Immunization History  Administered Date(s) Administered  . H1N1 02/27/2008  . Td 01/19/2004  . Tdap 12/17/2014     Objective: Vital Signs: There were no vitals taken for this visit.   Physical Exam   Musculoskeletal Exam: ***  CDAI Exam: CDAI Score: Not documented Patient Global Assessment: Not documented; Provider Global Assessment: Not documented Swollen: Not documented; Tender: Not documented Joint Exam   Not documented   There is currently no information documented on the homunculus. Go to the Rheumatology activity and complete the homunculus joint exam.  Investigation: No additional findings.  Imaging: No results found.  Recent Labs: Lab Results  Component Value Date   WBC 6.2 05/18/2018   HGB 14.0 05/18/2018   PLT 321 05/18/2018   NA 142 05/18/2018   K 4.0 05/18/2018   CL 105 05/18/2018   CO2 24 05/18/2018   GLUCOSE 123 (H) 05/18/2018   BUN 14 05/18/2018   CREATININE 0.89 05/18/2018   BILITOT 0.2 05/18/2018  ALKPHOS 69 05/18/2018   AST 17 05/18/2018   ALT 13 05/18/2018   PROT 6.6 05/18/2018   ALBUMIN 4.4 05/18/2018   CALCIUM 10.2 05/18/2018   GFRAA 79 05/18/2018    Speciality Comments: No specialty comments available.  Procedures:  No procedures performed Allergies: Meperidine hcl and Pentazocine lactate   Assessment / Plan:     Visit Diagnoses: No diagnosis  found.   Orders: No orders of the defined types were placed in this encounter.  No orders of the defined types were placed in this encounter.   Face-to-face time spent with patient was *** minutes. Greater than 50% of time was spent in counseling and coordination of care.  Follow-Up Instructions: No follow-ups on file.   Ofilia Neas, PA-C  Note - This record has been created using Dragon software.  Chart creation errors have been sought, but may not always  have been located. Such creation errors do not reflect on  the standard of medical care.

## 2018-06-13 ENCOUNTER — Other Ambulatory Visit: Payer: Self-pay | Admitting: Family Medicine

## 2018-06-13 ENCOUNTER — Other Ambulatory Visit: Payer: Self-pay | Admitting: Rheumatology

## 2018-06-13 NOTE — Telephone Encounter (Signed)
Last Visit:12/13/2017 Next Visit:06/21/2018  Okay to refill per Dr. Estanislado Pandy

## 2018-06-21 ENCOUNTER — Ambulatory Visit: Payer: Self-pay | Admitting: Physician Assistant

## 2018-07-03 ENCOUNTER — Telehealth: Payer: Self-pay | Admitting: Family Medicine

## 2018-07-03 ENCOUNTER — Other Ambulatory Visit: Payer: Self-pay

## 2018-07-03 ENCOUNTER — Ambulatory Visit (INDEPENDENT_AMBULATORY_CARE_PROVIDER_SITE_OTHER): Payer: 59 | Admitting: Family Medicine

## 2018-07-03 DIAGNOSIS — R52 Pain, unspecified: Secondary | ICD-10-CM | POA: Diagnosis not present

## 2018-07-03 DIAGNOSIS — Z20828 Contact with and (suspected) exposure to other viral communicable diseases: Secondary | ICD-10-CM | POA: Diagnosis not present

## 2018-07-03 DIAGNOSIS — R6889 Other general symptoms and signs: Secondary | ICD-10-CM

## 2018-07-03 DIAGNOSIS — R519 Headache, unspecified: Secondary | ICD-10-CM

## 2018-07-03 DIAGNOSIS — Z20822 Contact with and (suspected) exposure to covid-19: Secondary | ICD-10-CM

## 2018-07-03 DIAGNOSIS — R51 Headache: Secondary | ICD-10-CM

## 2018-07-03 DIAGNOSIS — J302 Other seasonal allergic rhinitis: Secondary | ICD-10-CM

## 2018-07-03 DIAGNOSIS — W57XXXA Bitten or stung by nonvenomous insect and other nonvenomous arthropods, initial encounter: Secondary | ICD-10-CM

## 2018-07-03 DIAGNOSIS — R509 Fever, unspecified: Secondary | ICD-10-CM

## 2018-07-03 MED ORDER — DOXYCYCLINE HYCLATE 100 MG PO TABS: 100.0000 mg | ORAL_TABLET | Freq: Two times a day (BID) | ORAL | 0 refills | Status: DC

## 2018-07-03 MED ORDER — FLUTICASONE PROPIONATE 50 MCG/ACT NA SUSP: 2.0000 | Freq: Every day | NASAL | 2 refills | Status: DC

## 2018-07-03 NOTE — Progress Notes (Signed)
Patient ID: Cheryl Knight, female   DOB: [birth year], 63 y.o.   MRN: [id number]    Virtual Visit via video Note  This visit type was conducted due to national recommendations for restrictions regarding the COVID-19 pandemic (e.g. social distancing).  This format is felt to be most appropriate for this patient at this time.  All issues noted in this document were discussed and addressed.  No physical exam was performed (except for noted visual exam findings with Video Visits).   I connected with Cheryl Knight today at  1:20 PM EDT by a video enabled telemedicine application and verified that I am speaking with the correct person using two identifiers. Location patient: home Location provider: [facility] Persons participating in the virtual visit: patient, provider  I discussed the limitations, risks, security and privacy concerns of performing an evaluation and management service by video and the availability of in person appointments. I also discussed with the patient that there may be a patient responsible charge related to this service. The patient expressed understanding and agreed to proceed.  HPI: Patient and I connected via video due to complaints of headache, fever, body aches for the past 2 to 3 days.  Patient has not traveled many places throughout the last many weeks due to COVID-19 restrictions.  She has gone to the grocery store but otherwise has been remaining home.  Patient states she has been out in her garden almost every day in the past couple of weeks and does report multiple tick bites.  Does not believe any of the ticks were on her for more than a few hours at a time, but cannot be sure.  No cough, shortness breath or wheezing.  No chest pain.  No vomiting or diarrhea.  No urinary problems.   ROS: See pertinent positives and negatives per HPI.  Past Medical History:  Diagnosis Date  . High cholesterol   . Hypertension   . Irritable bowel   . Rheumatoid  arthritis ([place])   . Thyroid disease     Past Surgical History:  Procedure Laterality Date  . ABDOMINAL HYSTERECTOMY    . TONSILLECTOMY    . TUBAL LIGATION      Family History  Problem Relation Age of Onset  . Hypertension Mother   . Cancer Mother   . Thyroid disease Mother   . Cancer Father   . Stroke Maternal Grandmother    Social History   Tobacco Use  . Smoking status: Light Tobacco Smoker    Types: Cigarettes  . Smokeless tobacco: Never Used  . Tobacco comment: socially  Substance Use Topics  . Alcohol use: Yes    Comment: socially      Current Outpatient Medications:  .  betamethasone dipropionate (DIPROLENE) 0.05 % cream, Apply topically 2 (two) times daily., Disp: 45 g, Rfl: 3 .  Calcium Carbonate-Vitamin D (CALCIUM PLUS VITAMIN D PO), Take by mouth., Disp: , Rfl:  .  cetirizine (ZYRTEC) 10 MG tablet, Take 10 mg by mouth daily., Disp: , Rfl:  .  conjugated estrogens (PREMARIN) vaginal cream, Place 1 Applicatorful vaginally daily., Disp: 42.5 g, Rfl: 12 .  cyclobenzaprine (FLEXERIL) 10 MG tablet, Take 1 tablet (10 mg total) by mouth daily., Disp: 90 tablet, Rfl: 1 .  esomeprazole (NEXIUM) 40 MG capsule, 1 by mouth twice a day, Disp: 200 capsule, Rfl: 4 .  fish oil-omega-3 fatty acids 1000 MG capsule, Take 1 g by mouth daily., Disp: , Rfl:  .  folic acid (FOLVITE)  1 MG tablet, TAKE 2 TABLETS BY MOUTH EVERY DAY, Disp: 180 tablet, Rfl: 3 .  glucosamine-chondroitin 500-400 MG tablet, Take 1 tablet by mouth 3 (three) times daily., Disp: , Rfl:  .  HYDROcodone-acetaminophen (NORCO) 7.5-325 MG tablet, Take 1 tab by mouth twice daily as needed for migraine, Disp: 60 tablet, Rfl: 0 .  levothyroxine (SYNTHROID) 100 MCG tablet, TAKE 1 TABLET(100 MCG) BY MOUTH DAILY, Disp: 30 tablet, Rfl: 0 .  lisinopril-hydrochlorothiazide (PRINZIDE,ZESTORETIC) 20-12.5 MG tablet, Take 1 tablet by mouth daily., Disp: 100 tablet, Rfl: 4 .  Methotrexate Sodium (METHOTREXATE, PF,) 50 MG/2ML  injection, INJEXT 0.8 ML UNDER THE SKIN ONCE A WEEK, Disp: 10 mL, Rfl: 0 .  pilocarpine (SALAGEN) 5 MG tablet, TAKE 1 TABLET(5 MG) BY MOUTH THREE TIMES DAILY, Disp: 270 tablet, Rfl: 0 .  pravastatin (PRAVACHOL) 20 MG tablet, Take 1 tablet (20 mg total) by mouth every Monday, Wednesday, and Friday., Disp: 90 tablet, Rfl: 1 .  predniSONE (DELTASONE) 5 MG tablet, Take 4 tablets by mouth daily for 2 days, 3 tablets by mouth for 2 days, 2 tablets by mouth for 2 days, 1 tablet by mouth for 2 days., Disp: 20 tablet, Rfl: 0 .  promethazine (PHENERGAN) 25 MG tablet, Take 1 tablet (25 mg total) by mouth every 6 (six) hours as needed., Disp: 30 tablet, Rfl: 2 .  RESTASIS 0.05 % ophthalmic emulsion, , Disp: , Rfl:  .  SUMAtriptan (IMITREX) 50 MG tablet, TAKE 1 TABLET BY MOUTH AS NEEDED FOR HEADACHE, Disp: 9 tablet, Rfl: 4 .  topiramate (TOPAMAX) 100 MG tablet, TAKE 1 TABLET BY MOUTH ONCE DAILY, Disp: 90 tablet, Rfl: 1 .  traMADol (ULTRAM) 50 MG tablet, TAKE 2 TABLETS BY MOUTH TWICE DAILY AS NEEDED FOR PAIN RELIEF, Disp: 120 tablet, Rfl: 0  EXAM:  GENERAL: alert, oriented, appears well and in no acute distress  HEENT: atraumatic, conjunttiva clear, no obvious abnormalities on inspection of external nose and ears  NECK: normal movements of the head and neck  LUNGS: on inspection no signs of respiratory distress, breathing rate appears normal, no obvious gross SOB, gasping or wheezing  CV: no obvious cyanosis  MS: moves all visible extremities without noticeable abnormality  PSYCH/NEURO: pleasant and cooperative, no obvious depression or anxiety, speech and thought processing grossly intact  ASSESSMENT AND PLAN:  Discussed the following assessment and plan:  Fever, body aches, headache, suspect COVID-19 - due to patient's symptoms we will get her tested for COVID-19.  Aware she can use Tylenol as needed for fever, headache and body aches, do you have good fluid intake, do diligent handwashing.  She is  aware she will get called by a nurse to get set up for time to go and be tested for COVID-19.  Advised that the testing results take between 2 and 4 days to come back.  While the results are pending she must remain home on a quarantine at home.  Advised that if her symptoms worsen in any way she is to call office and let us know and to be advised that further instruction.  Tick bite multiple sites- we will cover patient with doxycycline course to treat tickborne illnesses such as [facility] spotted fever and Lyme.  Discussed use of bug spray when out side in the garden and/or woods to help prevent tick bites.   I discussed the assessment and treatment plan with the patient. The patient was provided an opportunity to ask questions and all were answered. The patient agreed  with the plan and demonstrated an understanding of the instructions.   The patient was advised to call back or seek an in-person evaluation if the symptoms worsen or if the condition fails to improve as anticipated.   Jodelle Green, FNP

## 2018-07-03 NOTE — Telephone Encounter (Signed)
Cheryl Knight  [birth year]  [id number]  Need for covid testing -- fever, body aches, runny nose  [facility], [phone]

## 2018-07-03 NOTE — Telephone Encounter (Signed)
Request from PCP for COVID screening / Patient scheduled for 07-04-2018 @ 10:30. Order placed

## 2018-07-04 ENCOUNTER — Other Ambulatory Visit: Payer: 59

## 2018-07-04 DIAGNOSIS — Z20822 Contact with and (suspected) exposure to covid-19: Secondary | ICD-10-CM

## 2018-07-06 LAB — NOVEL CORONAVIRUS, NAA: SARS-CoV-2, NAA: NOT DETECTED

## 2018-07-07 ENCOUNTER — Telehealth: Payer: Self-pay

## 2018-07-07 NOTE — Telephone Encounter (Signed)
Called Pt and she stated last night she took Phenergan and a Hydrocodone for the headache, fever and nausea. Pt stated  when she woke up this morning she felt better all day so far. She stated if she starts not feeling good she will go to Urgent care.

## 2018-07-07 NOTE — Telephone Encounter (Signed)
Copied from [place] [id number]. Topic: General - Other >> Jul 07, 2018  8:29 AM Leward Quan A wrote: Reason for CRM: Patient would like Lauren Guse to know that she is still having headaches she states that she did not sleep at all last night. Asking for a call back with a possible solution please. Ph# [phone]

## 2018-07-07 NOTE — Telephone Encounter (Signed)
Called and spoke to patient.  Patient stated that she has had a fever between 99 and 103 for the past seven days.  Patient also said that she had a severe headache last night which is different from the migraines that she is used to.  Patient said that she was tested for COVID and the test came back negative.  Pt said that she is also blowing out blood when blowing nose.  Mucus is clear and bloody.  Patient is still taking antibiotics that was prescribed by PCP Cheryl Knight.  Patient said that she takes bp meds but has not taken them for 4 days because she has no refills on her prescription.  Patient has not checked her blood pressure since she does not have equipment at home.  Patient requested refill for lisinopril-hydrochlorothiazide and hydrocodone for headache.  Both of these prescriptions was initially prescribed by Dr. Jacqulynn Cadet A. Sherren Knight who has since retired.  Patient said that she has tried to get prescriptions refilled at the her former provider's practice before but the other physicians will not refill prescriptions.  Informed patient that Cheryl Knight is out of the office today and request will need to be forwarded to clinic team lead to see if another provider would call in the prescription.

## 2018-07-07 NOTE — Telephone Encounter (Deleted)
NP saw this patient on 07/03/18 OK in virtual ok to fill BP medication filled by another provider, there e is also a controlled substance requ

## 2018-07-07 NOTE — Telephone Encounter (Signed)
Noted  Lauren, you may want to reach out to this patient to follow up.

## 2018-07-07 NOTE — Telephone Encounter (Signed)
Cheryl Knight, Call pt back. I'm concerned with HA, fever. She was seen 4 days ago. Doesn't sound like she is better and I would feel comfortable if she was seen. I would advise [facility] urgent care.

## 2018-07-11 ENCOUNTER — Other Ambulatory Visit: Payer: Self-pay | Admitting: Lab

## 2018-07-11 DIAGNOSIS — I10 Essential (primary) hypertension: Secondary | ICD-10-CM

## 2018-07-11 MED ORDER — LISINOPRIL-HYDROCHLOROTHIAZIDE 20-12.5 MG PO TABS: 1.0000 | ORAL_TABLET | Freq: Every day | ORAL | 1 refills | Status: DC

## 2018-07-11 NOTE — Telephone Encounter (Signed)
Called Pt No answer, left a VM will call back later

## 2018-07-11 NOTE — Telephone Encounter (Signed)
[facility],  Can we call patient back this week to see how she is doing?  Looks like on Friday she reported she was feeling better  Thanks  LG

## 2018-07-11 NOTE — Telephone Encounter (Signed)
Pt called me back, Pt stated she feels much better, She did Not go to Urgent care. Pt is still taking the antibiotic. Her head still congested and  headaches sometimes. Pt also states she is out of her Lisinopril she needs a refill.

## 2018-07-13 ENCOUNTER — Other Ambulatory Visit: Payer: Self-pay | Admitting: Family Medicine

## 2018-07-13 ENCOUNTER — Other Ambulatory Visit: Payer: Self-pay | Admitting: Rheumatology

## 2018-07-14 NOTE — Telephone Encounter (Signed)
Last Visit: 12/13/2017 Next Visit: 06/21/2018 UDS:05/18/18 Narc Agreement: 05/18/18  Last fill: 05/16/18  Okay to refill tramadol?

## 2018-07-20 ENCOUNTER — Other Ambulatory Visit: Payer: Self-pay | Admitting: Lab

## 2018-07-20 DIAGNOSIS — Z8669 Personal history of other diseases of the nervous system and sense organs: Secondary | ICD-10-CM

## 2018-07-20 MED ORDER — SUMATRIPTAN SUCCINATE 50 MG PO TABS: ORAL_TABLET | ORAL | 4 refills | Status: DC

## 2018-07-23 ENCOUNTER — Telehealth: Payer: Self-pay | Admitting: Rheumatology

## 2018-07-24 MED ORDER — FOLIC ACID 1 MG PO TABS: 2.0000 mg | ORAL_TABLET | Freq: Every day | ORAL | 3 refills | Status: DC

## 2018-07-24 NOTE — Telephone Encounter (Signed)
Last Visit:12/13/2017 Next Visit: was due June 2020. Message sent to the front to schedule.  Labs: 05/18/18 WNL  Okay to refill per Dr. Estanislado Pandy

## 2018-07-24 NOTE — Telephone Encounter (Signed)
Please schedule patient for a follow up visit. Patient due June 2020. Thanks!

## 2018-07-25 NOTE — Telephone Encounter (Signed)
I LMOM for patient to call, and schedule her follow up appt with Dr. Estanislado Pandy.

## 2018-07-26 ENCOUNTER — Telehealth: Payer: Self-pay | Admitting: Rheumatology

## 2018-07-26 NOTE — Telephone Encounter (Signed)
Patient left a voicemail to schedule an appointment.  Patient states she is due to take her Methotrexate on Saturday and is out of medication.

## 2018-07-26 NOTE — Telephone Encounter (Signed)
Attempted to contact patient and left message for patient to call the office. Patient is due for a follow up visit. Patient is due for a follow up visit. Prescription sent to the pharmacy on 05/31/18 for a 90 day supply.

## 2018-07-27 ENCOUNTER — Other Ambulatory Visit: Payer: Self-pay | Admitting: *Deleted

## 2018-07-27 MED ORDER — METHOTREXATE SODIUM CHEMO INJECTION 50 MG/2ML: 20.0000 mg | INTRAMUSCULAR | 0 refills | Status: DC

## 2018-07-27 NOTE — Telephone Encounter (Signed)
Last Visit:12/13/2017 Next Visit: 08/03/18 Labs: 05/18/18 WNL  Okay to refill per Dr. Estanislado Pandy

## 2018-07-28 NOTE — Progress Notes (Deleted)
Virtual Visit via Video Note  I connected with Langston Reusing on 07/28/18 at  1:00 PM EDT by a video enabled telemedicine application and verified that I am speaking with the correct person using two identifiers.  Location: Patient: *** Provider: ***   I discussed the limitations of evaluation and management by telemedicine and the availability of in person appointments. The patient expressed understanding and agreed to proceed. CC: History of Present Illness: Patient is a 63 year old female with a past medical history of seropositive rheumatoid arthritis.  She is on MTX 0.8 ml sq once weekly and folic acid 2 mg po daily.    Review of Systems  Constitutional: Negative for fever and malaise/fatigue.  Eyes: Negative for photophobia, pain, discharge and redness.  Respiratory: Negative for cough, shortness of breath and wheezing.   Cardiovascular: Negative for chest pain and palpitations.  Gastrointestinal: Negative for blood in stool, constipation and diarrhea.  Genitourinary: Negative for dysuria.  Musculoskeletal: Negative for back pain, joint pain, myalgias and neck pain.  Skin: Negative for rash.  Neurological: Negative for dizziness and headaches.  Psychiatric/Behavioral: Negative for depression. The patient is not nervous/anxious and does not have insomnia.       Observations/Objective: Physical Exam  Constitutional: She is oriented to person, place, and time and well-developed, well-nourished, and in no distress.  HENT:  Head: Normocephalic and atraumatic.  Eyes: Conjunctivae are normal.  Pulmonary/Chest: Effort normal.  Neurological: She is alert and oriented to person, place, and time.  Psychiatric: Mood, memory, affect and judgment normal.   Patient reports morning stiffness for *** {minute/hour:19697}.   Patient {Actions; denies-reports:120008} nocturnal pain.  Difficulty dressing/grooming: {ACTIONS;DENIES/REPORTS:21021675::"Denies"} Difficulty climbing stairs:  {ACTIONS;DENIES/REPORTS:21021675::"Denies"} Difficulty getting out of chair: {ACTIONS;DENIES/REPORTS:21021675::"Denies"} Difficulty using hands for taps, buttons, cutlery, and/or writing: {ACTIONS;DENIES/REPORTS:21021675::"Denies"}   Assessment and Plan: Visit Diagnoses: Rheumatoid arthritis involving multiple sites with positive rheumatoid factor ([place]) - +RF:   High risk medication use - Methotrexate 9.4TM per week, folic acid 2 mg a day.    Psoriasis:   Primary osteoarthritis of both knees -  Primary osteoarthritis of both feet:   Sicca syndrome ([place]) -  She takes pilocarpine 5 mg 1 tablet by mouth PRN. She does not need a refill at this time.   Other medical conditions are listed as follows:   History of migraine  History of hypothyroidism  History of hyperlipidemia  History of gastroesophageal reflux (GERD)   Follow Up Instructions: She will follow up in    I discussed the assessment and treatment plan with the patient. The patient was provided an opportunity to ask questions and all were answered. The patient agreed with the plan and demonstrated an understanding of the instructions.   The patient was advised to call back or seek an in-person evaluation if the symptoms worsen or if the condition fails to improve as anticipated.  I provided *** minutes of non-face-to-face time during this encounter.   Ofilia Neas, PA-C

## 2018-08-04 ENCOUNTER — Telehealth: Payer: 59 | Admitting: Rheumatology

## 2018-08-05 ENCOUNTER — Other Ambulatory Visit: Payer: Self-pay | Admitting: Family Medicine

## 2018-08-05 ENCOUNTER — Other Ambulatory Visit: Payer: Self-pay | Admitting: Rheumatology

## 2018-08-07 NOTE — Telephone Encounter (Signed)
Last Visit: 12/13/2017 Next Visit: 08/18/2018  Okay to refill per Dr. Estanislado Pandy.

## 2018-08-11 NOTE — Progress Notes (Signed)
Virtual Visit via Video Note  I connected with Cheryl Knight on 08/11/18 at  1:00 PM EDT by a video enabled telemedicine application and verified that I am speaking with the correct person using two identifiers.  Location: Patient: Home  Provider: Clinic This service was conducted via virtual visit.  She was unable to log on to [facility], so we talked on the telephone. The patient was located at home. I was located in my office.  Consent was obtained prior to the virtual visit and is aware of possible charges through their insurance for this visit.  The patient is an established patient.  Dr. Estanislado Pandy, MD conducted the virtual visit and Hazel Sams, PA-C acted as scribe during the service.  Office staff helped with scheduling follow up visits after the service was conducted.   I discussed the limitations of evaluation and management by telemedicine and the availability of in person appointments. The patient expressed understanding and agreed to proceed.  HB:ZJIR in multiple joints   History of Present Illness: Patient is a 63 year old female with a past medical history of seropositive rheumatoid arthritis and osteoarthritis.  She is on MTX 0.8 ml sq once weekly and folic acid 2 mg po daily. She denies any joints swelling.  She is having increased pain in both hands and both knee joints. She has occasionally pain in both ankle joints.  She states the right foot plantar fasciitis has resolved.  She continues to have sicca symptoms.  She uses systane eye drops and biotene products for symptomatic relief. She continues to have chronic psoriasis.  She rates her RA a 4/10.    Review of Systems  Constitutional: Negative for fever and malaise/fatigue.  HENT:       +Mouth dryness  Eyes: Negative for photophobia, pain, discharge and redness.       +Eye dryness   Respiratory: Negative for cough, shortness of breath and wheezing.   Cardiovascular: Negative for chest pain and palpitations.   Gastrointestinal: Negative for blood in stool, constipation and diarrhea.  Genitourinary: Negative for dysuria.  Musculoskeletal: Positive for joint pain. Negative for back pain, myalgias and neck pain.       +Joint stiffness   Skin: Negative for rash.       +psoriasis  Neurological: Negative for dizziness and headaches.  Psychiatric/Behavioral: Negative for depression. The patient is not nervous/anxious and does not have insomnia.     Observations/Objective: Physical Exam  Constitutional: She is oriented to person, place, and time and well-developed, well-nourished, and in no distress.  HENT:  Head: Normocephalic and atraumatic.  Eyes: Conjunctivae are normal.  Pulmonary/Chest: Effort normal.  Neurological: She is alert and oriented to person, place, and time.  Psychiatric: Mood, memory, affect and judgment normal.   Patient reports morning stiffness for 20 minutes.   Patient reports nocturnal pain.  Difficulty dressing/grooming: Denies Difficulty climbing stairs: Reports Difficulty getting out of chair: Reports Difficulty using hands for taps, buttons, cutlery, and/or writing: Denies   Assessment and Plan: Visit Diagnoses: Rheumatoid arthritis involving multiple sites with positive rheumatoid factor ([place]) - +RF: She has been having increased pain in both hands and both knee joints.  She has no joint swelling at this time.  She experiences increased arthralgias with frequent weather changes.  She has intermittent discomfort in both ankle joints.  She continues to inject MTX 0.8 ml sq once weekly and folic acid 2 mg po daily.  She does not feel as though MTX is as effective  as it was in the past.  She does not want to make any medication changes at this time.  She will continue on this current treatment regimen.  She does not need any refills at this time.  She was advised to notify us if she develops increased joint pain or joint swelling.  She will follow up in 3 months.   High  risk medication use - Methotrexate 0.8 ml per sq once weekly and folic acid 2 mg a day. CBC and CMP WNL on 05/17/28.  She is due to update lab work today.  Future orders for CBC and CMP were placed today.  She takes tramadol 50 mg 2 tablets by mouth twice daily for pain relief. UDS was updated on 05/18/18.   Psoriasis: She has psoriasis along her hairline and lower back.   Primary osteoarthritis of both knees -She has chronic pain in both knee joints.  She has no joint swelling.    Primary osteoarthritis of both feet: She has no feet pain at this time.  She has intermittent pain in both ankle joints.   Sicca syndrome ([name]) - She continues to have sicca symptoms.  She takes pilocarpine 5 mg 1 tablet by mouth PRN.  She does not need any refills. She uses systane eye drops and biotene products for symptomatic relief.   Other medical conditions are listed as follows:   History of migraine  History of hypothyroidism  History of hyperlipidemia  Follow Up Instructions: She will follow up in 3 months.  She is due to update lab work.    I discussed the assessment and treatment plan with the patient. The patient was provided an opportunity to ask questions and all were answered. The patient agreed with the plan and demonstrated an understanding of the instructions.   The patient was advised to call back or seek an in-person evaluation if the symptoms worsen or if the condition fails to improve as anticipated.  I provided 25 minutes of non-face-to-face time during this encounter.  Bo Merino, MD  Scribed by-  Ofilia Neas, PA-C

## 2018-08-15 ENCOUNTER — Telehealth: Payer: Self-pay | Admitting: Rheumatology

## 2018-08-15 NOTE — Telephone Encounter (Signed)
Ok to fill out updated 5-month handicap placard.

## 2018-08-15 NOTE — Telephone Encounter (Signed)
Patient request a renew for her 6 month handicap placid. Please call when form ready to pick up.

## 2018-08-15 NOTE — Telephone Encounter (Signed)
Patient advised handicap placard is ready for pick up.

## 2018-08-18 ENCOUNTER — Other Ambulatory Visit: Payer: Self-pay

## 2018-08-18 ENCOUNTER — Telehealth (INDEPENDENT_AMBULATORY_CARE_PROVIDER_SITE_OTHER): Payer: 59 | Admitting: Rheumatology

## 2018-08-18 ENCOUNTER — Encounter: Payer: Self-pay | Admitting: Rheumatology

## 2018-08-18 DIAGNOSIS — M19072 Primary osteoarthritis, left ankle and foot: Secondary | ICD-10-CM

## 2018-08-18 DIAGNOSIS — M3500 Sicca syndrome, unspecified: Secondary | ICD-10-CM

## 2018-08-18 DIAGNOSIS — L409 Psoriasis, unspecified: Secondary | ICD-10-CM | POA: Diagnosis not present

## 2018-08-18 DIAGNOSIS — M0579 Rheumatoid arthritis with rheumatoid factor of multiple sites without organ or systems involvement: Secondary | ICD-10-CM | POA: Diagnosis not present

## 2018-08-18 DIAGNOSIS — Z8669 Personal history of other diseases of the nervous system and sense organs: Secondary | ICD-10-CM

## 2018-08-18 DIAGNOSIS — Z8719 Personal history of other diseases of the digestive system: Secondary | ICD-10-CM

## 2018-08-18 DIAGNOSIS — Z79899 Other long term (current) drug therapy: Secondary | ICD-10-CM | POA: Diagnosis not present

## 2018-08-18 DIAGNOSIS — M722 Plantar fascial fibromatosis: Secondary | ICD-10-CM

## 2018-08-18 DIAGNOSIS — Z8639 Personal history of other endocrine, nutritional and metabolic disease: Secondary | ICD-10-CM

## 2018-08-18 DIAGNOSIS — M170 Bilateral primary osteoarthritis of knee: Secondary | ICD-10-CM

## 2018-08-18 DIAGNOSIS — M19071 Primary osteoarthritis, right ankle and foot: Secondary | ICD-10-CM

## 2018-08-22 ENCOUNTER — Ambulatory Visit: Payer: 59 | Admitting: Family Medicine

## 2018-08-23 ENCOUNTER — Other Ambulatory Visit: Payer: Self-pay

## 2018-08-23 ENCOUNTER — Ambulatory Visit (INDEPENDENT_AMBULATORY_CARE_PROVIDER_SITE_OTHER): Payer: PPO | Admitting: Family Medicine

## 2018-08-23 ENCOUNTER — Telehealth: Payer: Self-pay | Admitting: Rheumatology

## 2018-08-23 DIAGNOSIS — E782 Mixed hyperlipidemia: Secondary | ICD-10-CM

## 2018-08-23 DIAGNOSIS — G43709 Chronic migraine without aura, not intractable, without status migrainosus: Secondary | ICD-10-CM | POA: Diagnosis not present

## 2018-08-23 DIAGNOSIS — I10 Essential (primary) hypertension: Secondary | ICD-10-CM

## 2018-08-23 DIAGNOSIS — R7309 Other abnormal glucose: Secondary | ICD-10-CM

## 2018-08-23 NOTE — Telephone Encounter (Signed)
I LMOM for patient to call, and schedule a rov for 11/2018 with Dr. Estanislado Pandy.

## 2018-08-23 NOTE — Progress Notes (Signed)
Patient ID: Cheryl Knight, female   DOB: [birth year], 63 y.o.   MRN: [id number]    Virtual Visit via phone Note  This visit type was conducted due to national recommendations for restrictions regarding the COVID-19 pandemic (e.g. social distancing).  This format is felt to be most appropriate for this patient at this time.  All issues noted in this document were discussed and addressed.  No physical exam was performed (except for noted visual exam findings with Video Visits).   I connected with Kurt today at 10:00 AM EDT by a video enabled telemedicine application or telephone and verified that I am speaking with the correct person using two identifiers. Location patient: home Location provider: work or home office Persons participating in the virtual visit: patient, provider  I discussed the limitations, risks, security and privacy concerns of performing an evaluation and management service by telephone and the availability of in person appointments. I also discussed with the patient that there may be a patient responsible charge related to this service. The patient expressed understanding and agreed to proceed.  Video connection attempted, but patient's Internet was not functioning so we decided to finish visit over phone  HPI:  Patient and I connected via telephone to follow-up on blood pressure, sugar levels, cholesterol chronic migraine.  Patient states she has been doing great.  Blood pressure was checked yesterday, 114/70.  Tolerating blood pressure medication without any problems, no chest pain, palpitations, lower extremity swelling.  Patient has been trying to work on diet and exercise, but does not believe she is on a good enough job.  She is curious to see what her new blood work will show in regards to her sugar levels and cholesterol levels.  Currently tolerating her pravastatin without any issues.  No joint aches or abdominal pain.  Does have history of chronic  migraines, but has not had a migraine breakthrough and over 6 months.  Patient is partially retired, only works 2 days a week and has been enjoying this schedule.  Feels that stress in life overall is decreased and this makes her very happy.   ROS: See pertinent positives and negatives per HPI.  Past Medical History:  Diagnosis Date  . High cholesterol   . Hypertension   . Irritable bowel   . Rheumatoid arthritis ([place])   . Thyroid disease     Past Surgical History:  Procedure Laterality Date  . ABDOMINAL HYSTERECTOMY    . TONSILLECTOMY    . TUBAL LIGATION      Family History  Problem Relation Age of Onset  . Hypertension Mother   . Cancer Mother   . Thyroid disease Mother   . Cancer Father   . Stroke Maternal Grandmother    Social History   Tobacco Use  . Smoking status: Light Tobacco Smoker    Types: Cigarettes  . Smokeless tobacco: Never Used  . Tobacco comment: socially  Substance Use Topics  . Alcohol use: Yes    Comment: socially     Current Outpatient Medications:  .  betamethasone dipropionate (DIPROLENE) 0.05 % cream, Apply topically 2 (two) times daily., Disp: 45 g, Rfl: 3 .  Calcium Carbonate-Vitamin D (CALCIUM PLUS VITAMIN D PO), Take by mouth., Disp: , Rfl:  .  cetirizine (ZYRTEC) 10 MG tablet, Take 10 mg by mouth daily., Disp: , Rfl:  .  conjugated estrogens (PREMARIN) vaginal cream, Place 1 Applicatorful vaginally daily., Disp: 42.5 g, Rfl: 12 .  cyclobenzaprine (FLEXERIL) 10 MG tablet, Take  1 tablet (10 mg total) by mouth daily., Disp: 90 tablet, Rfl: 1 .  doxycycline (VIBRA-TABS) 100 MG tablet, Take 1 tablet (100 mg total) by mouth 2 (two) times daily., Disp: 20 tablet, Rfl: 0 .  esomeprazole (NEXIUM) 40 MG capsule, 1 by mouth twice a day, Disp: 200 capsule, Rfl: 4 .  fish oil-omega-3 fatty acids 1000 MG capsule, Take 1 g by mouth daily., Disp: , Rfl:  .  fluticasone (FLONASE) 50 MCG/ACT nasal spray, Place 2 sprays into both nostrils daily., Disp:  16 g, Rfl: 2 .  folic acid (FOLVITE) 1 MG tablet, Take 2 tablets (2 mg total) by mouth daily., Disp: 180 tablet, Rfl: 3 .  glucosamine-chondroitin 500-400 MG tablet, Take 1 tablet by mouth 3 (three) times daily., Disp: , Rfl:  .  HYDROcodone-acetaminophen (NORCO) 7.5-325 MG tablet, Take 1 tab by mouth twice daily as needed for migraine, Disp: 60 tablet, Rfl: 0 .  levothyroxine (SYNTHROID) 100 MCG tablet, TAKE 1 TABLET(100 MCG) BY MOUTH DAILY, Disp: 30 tablet, Rfl: 0 .  lisinopril-hydrochlorothiazide (ZESTORETIC) 20-12.5 MG tablet, Take 1 tablet by mouth daily., Disp: 90 tablet, Rfl: 1 .  methotrexate 50 MG/2ML injection, Inject 0.8 mLs (20 mg total) into the skin once a week., Disp: 10 mL, Rfl: 0 .  pilocarpine (SALAGEN) 5 MG tablet, TAKE 1 TABLET BY MOUTH THREE TIMES DAILY, Disp: 270 tablet, Rfl: 0 .  pravastatin (PRAVACHOL) 20 MG tablet, Take 1 tablet (20 mg total) by mouth every Monday, Wednesday, and Friday., Disp: 90 tablet, Rfl: 1 .  predniSONE (DELTASONE) 5 MG tablet, Take 4 tablets by mouth daily for 2 days, 3 tablets by mouth for 2 days, 2 tablets by mouth for 2 days, 1 tablet by mouth for 2 days., Disp: 20 tablet, Rfl: 0 .  promethazine (PHENERGAN) 25 MG tablet, Take 1 tablet (25 mg total) by mouth every 6 (six) hours as needed., Disp: 30 tablet, Rfl: 2 .  RESTASIS 0.05 % ophthalmic emulsion, , Disp: , Rfl:  .  SUMAtriptan (IMITREX) 50 MG tablet, TAKE 1 TABLET BY MOUTH AS NEEDED FOR HEADACHE, Disp: 9 tablet, Rfl: 4 .  topiramate (TOPAMAX) 100 MG tablet, TAKE 1 TABLET BY MOUTH ONCE DAILY, Disp: 90 tablet, Rfl: 1 .  traMADol (ULTRAM) 50 MG tablet, TAKE 2 TABLETS BY MOUTH TWICE DAILY AS NEEDED FOR PAIN RELIEF, Disp: 120 tablet, Rfl: 0  EXAM:  GENERAL: alert, oriented, sounds well and in no acute distress  LUNGS: Speaking in full sentences, no signs of respiratory distress, breathing rate appears normal, no obvious gross SOB, gasping or wheezing  PSYCH/NEURO: pleasant and cooperative, no  obvious depression or anxiety, speech and thought processing grossly intact  ASSESSMENT AND PLAN:  Discussed the following assessment and plan:  Hyperlipidemia-we will get new lipid panel monitor to see cholesterol medication needs to be adjusted.    History of elevated glucose-we will get new G4W and metabolic panel.  Essential hypertension-blood pressure well controlled on current medication regimen, will continue.  Chronic migraine-has not had a migraine breakthrough in many months.  Does have Imitrex and Phenergan as needed to use if having migraine breakthrough.    I discussed the assessment and treatment plan with the patient. The patient was provided an opportunity to ask questions and all were answered. The patient agreed with the plan and demonstrated an understanding of the instructions.   The patient was advised to call back or seek an in-person evaluation if the symptoms worsen or if the condition fails  to improve as anticipated.  I provided 15 minutes of non-face-to-face time during this encounter.   Jodelle Green, FNP

## 2018-08-23 NOTE — Telephone Encounter (Signed)
-----   Message from Carole Binning, LPN sent at 04/06/2332  4:14 PM EDT ----- Patient had a virtual visit today. Please schedule patient for a follow up visit in 3 months.

## 2018-09-01 ENCOUNTER — Telehealth: Payer: Self-pay | Admitting: *Deleted

## 2018-09-01 ENCOUNTER — Other Ambulatory Visit: Payer: Self-pay | Admitting: Lab

## 2018-09-01 NOTE — Telephone Encounter (Signed)
Copied from [place] [id number]. Topic: General - Other >> Sep 01, 2018 11:48 AM Rainey Pines A wrote: Patient would like to know if she can still get labs done at office or does she need to go somewhere else in regards to her medicare wellness exam. Patient said that a message can be left on voicemail as well.

## 2018-09-01 NOTE — Telephone Encounter (Signed)
Returned Pt call. Pt stated she has a new insurance and wanted to know if we take her insurance. I was not sure so Pt stated she will call insurance to see what labs she can go to.

## 2018-09-06 ENCOUNTER — Other Ambulatory Visit: Payer: 59

## 2018-09-12 ENCOUNTER — Other Ambulatory Visit: Payer: Self-pay | Admitting: Family Medicine

## 2018-09-13 ENCOUNTER — Other Ambulatory Visit: Payer: Self-pay

## 2018-09-13 DIAGNOSIS — Z79899 Other long term (current) drug therapy: Secondary | ICD-10-CM | POA: Diagnosis not present

## 2018-09-14 ENCOUNTER — Other Ambulatory Visit: Payer: Self-pay | Admitting: *Deleted

## 2018-09-14 DIAGNOSIS — D1801 Hemangioma of skin and subcutaneous tissue: Secondary | ICD-10-CM | POA: Diagnosis not present

## 2018-09-14 DIAGNOSIS — Z85828 Personal history of other malignant neoplasm of skin: Secondary | ICD-10-CM | POA: Diagnosis not present

## 2018-09-14 DIAGNOSIS — D2262 Melanocytic nevi of left upper limb, including shoulder: Secondary | ICD-10-CM | POA: Diagnosis not present

## 2018-09-14 DIAGNOSIS — D2272 Melanocytic nevi of left lower limb, including hip: Secondary | ICD-10-CM | POA: Diagnosis not present

## 2018-09-14 DIAGNOSIS — Z8669 Personal history of other diseases of the nervous system and sense organs: Secondary | ICD-10-CM

## 2018-09-14 DIAGNOSIS — L814 Other melanin hyperpigmentation: Secondary | ICD-10-CM | POA: Diagnosis not present

## 2018-09-14 DIAGNOSIS — D2261 Melanocytic nevi of right upper limb, including shoulder: Secondary | ICD-10-CM | POA: Diagnosis not present

## 2018-09-14 DIAGNOSIS — D2271 Melanocytic nevi of right lower limb, including hip: Secondary | ICD-10-CM | POA: Diagnosis not present

## 2018-09-14 DIAGNOSIS — L821 Other seborrheic keratosis: Secondary | ICD-10-CM | POA: Diagnosis not present

## 2018-09-14 DIAGNOSIS — L820 Inflamed seborrheic keratosis: Secondary | ICD-10-CM | POA: Diagnosis not present

## 2018-09-14 DIAGNOSIS — D225 Melanocytic nevi of trunk: Secondary | ICD-10-CM | POA: Diagnosis not present

## 2018-09-14 LAB — COMPLETE METABOLIC PANEL WITH GFR
AG Ratio: 1.9 (calc) (ref 1.0–2.5)
ALT: 9 U/L (ref 6–29)
AST: 15 U/L (ref 10–35)
Albumin: 4.4 g/dL (ref 3.6–5.1)
Alkaline phosphatase (APISO): 49 U/L (ref 37–153)
BUN: 16 mg/dL (ref 7–25)
CO2: 26 mmol/L (ref 20–32)
Calcium: 10.2 mg/dL (ref 8.6–10.4)
Chloride: 106 mmol/L (ref 98–110)
Creat: 0.67 mg/dL (ref 0.50–0.99)
GFR, Est African American: 107 mL/min/{1.73_m2} (ref >=60)
GFR, Est Non African American: 92 mL/min/{1.73_m2} (ref >=60)
Globulin: 2.3 g/dL (calc) (ref 1.9–3.7)
Glucose, Bld: 145 mg/dL — ABNORMAL HIGH (ref 65–99)
Potassium: 3.9 mmol/L (ref 3.5–5.3)
Sodium: 142 mmol/L (ref 135–146)
Total Bilirubin: 0.2 mg/dL (ref 0.2–1.2)
Total Protein: 6.7 g/dL (ref 6.1–8.1)

## 2018-09-14 LAB — CBC WITH DIFFERENTIAL/PLATELET
Absolute Monocytes: 468 cells/uL (ref 200–950)
Basophils Absolute: 79 cells/uL (ref 0–200)
Basophils Relative: 1.1 %
Eosinophils Absolute: 187 cells/uL (ref 15–500)
Eosinophils Relative: 2.6 %
HCT: 41.1 % (ref 35.0–45.0)
Hemoglobin: 13.7 g/dL (ref 11.7–15.5)
Lymphs Abs: 3737 cells/uL (ref 850–3900)
MCH: 31.3 pg (ref 27.0–33.0)
MCHC: 33.3 g/dL (ref 32.0–36.0)
MCV: 93.8 fL (ref 80.0–100.0)
MPV: 9.9 fL (ref 7.5–12.5)
Monocytes Relative: 6.5 %
Neutro Abs: 2729 cells/uL (ref 1500–7800)
Neutrophils Relative %: 37.9 %
Platelets: 332 10*3/uL (ref 140–400)
RBC: 4.38 10*6/uL (ref 3.80–5.10)
RDW: 13.2 % (ref 11.0–15.0)
Total Lymphocyte: 51.9 %
WBC: 7.2 10*3/uL (ref 3.8–10.8)

## 2018-09-14 MED ORDER — TRAMADOL HCL 50 MG PO TABS: ORAL_TABLET | ORAL | 0 refills | Status: DC

## 2018-09-14 NOTE — Addendum Note (Signed)
Addended by: Carole Binning on: 09/14/2018 03:46 PM   Modules accepted: Orders

## 2018-09-14 NOTE — Telephone Encounter (Signed)
Last Visit: 08/18/18 Next Visit: due October 2020. Message sent to the front to schedule.  UDS: 05/18/18 Narc agreement: 05/18/18  Okay to refill Tramadol?

## 2018-09-14 NOTE — Addendum Note (Signed)
Addended by: Ofilia Neas on: 09/14/2018 04:07 PM   Modules accepted: Orders

## 2018-09-14 NOTE — Telephone Encounter (Signed)
Patient requesting refill of Tramadol, [facility], [place]

## 2018-09-14 NOTE — Progress Notes (Signed)
Glucose is elevated-145.  Rest of CMP WNL.  CBC WNL

## 2018-09-17 ENCOUNTER — Other Ambulatory Visit: Payer: Self-pay | Admitting: Family Medicine

## 2018-09-17 DIAGNOSIS — Z8669 Personal history of other diseases of the nervous system and sense organs: Secondary | ICD-10-CM

## 2018-09-18 ENCOUNTER — Other Ambulatory Visit: Payer: Self-pay

## 2018-10-03 ENCOUNTER — Other Ambulatory Visit: Payer: Self-pay | Admitting: Rheumatology

## 2018-10-04 ENCOUNTER — Telehealth: Payer: Self-pay | Admitting: Rheumatology

## 2018-10-04 NOTE — Telephone Encounter (Signed)
Patient called stating her current pharmacy [facility] is using single use vials for her Methotrexate which causes her to run out of medication.  Patient states she found out that the [facility] on [address] sells the multiuse vials and is going to check with her current [facility] to see if they can sell it that way too.  Patient states if they are unable to fill the prescription with multiuse vials she will switch her pharmacy.

## 2018-10-04 NOTE — Telephone Encounter (Signed)
Please schedule patient for follow up visit. Patient is due October 2020. Thanks!

## 2018-10-04 NOTE — Telephone Encounter (Signed)
Last Visit: 08/18/18 Next Visit: due October 2020. Message sent to the front to schedule Labs: 09/13/18 Glucose is elevated-145. Rest of CMP WNL. CBC WNL  Okay to refill per Dr. Estanislado Pandy

## 2018-10-04 NOTE — Telephone Encounter (Signed)
Patient states she received her prescription from the pharmacy. Patient states she will look into a different pharmacy prior to her next fill.

## 2018-10-15 ENCOUNTER — Other Ambulatory Visit: Payer: Self-pay | Admitting: Family Medicine

## 2018-10-18 NOTE — Progress Notes (Signed)
Office Visit Note  Patient: Cheryl Knight             Date of Birth: 08/28/1955           MRN: [id number]             PCP: Jodelle Green, FNP Referring: Jodelle Green, FNP Visit Date: 11/01/2018 Occupation: @GUAROCC @  Subjective:  Pain in both hands    History of Present Illness: Cheryl Knight is a 63 y.o. female with history of seropositive rheumatoid arthritis and osteoarthritis.  She is on Methotrexate 0.8 ml sq once weekly and folic acid 2 mg po daily.  She is having increased pain in both hands and both ankle joints.  She states she typically has increased pain with change of seasons.  She denies any joint swelling.  She has discomfort in both knee joints when climbing steps.  She states she has been hunting recently, and she is now experiencing pain in the mid thoracic region of her back.  She states with certain movements her back will "catch."      Activities of Daily Living:  Patient reports morning stiffness for 40 minutes.   Patient Reports nocturnal pain.  Difficulty dressing/grooming: Reports Difficulty climbing stairs: Reports Difficulty getting out of chair: Reports Difficulty using hands for taps, buttons, cutlery, and/or writing: Reports  Review of Systems  Constitutional: Negative for fatigue.  HENT: Positive for mouth dryness and nose dryness. Negative for mouth sores.   Eyes: Positive for dryness. Negative for pain, itching and visual disturbance.  Respiratory: Negative for cough, hemoptysis, shortness of breath, wheezing and difficulty breathing.   Cardiovascular: Negative for chest pain, palpitations, hypertension and swelling in legs/feet.  Gastrointestinal: Negative for abdominal pain, blood in stool, constipation and diarrhea.  Endocrine: Negative for increased urination.  Genitourinary: Negative for difficulty urinating and painful urination.  Musculoskeletal: Positive for arthralgias, joint pain, joint swelling and morning stiffness.  Negative for myalgias, muscle weakness, muscle tenderness and myalgias.  Skin: Negative for color change, pallor, rash, hair loss, nodules/bumps, skin tightness, ulcers and sensitivity to sunlight.  Allergic/Immunologic: Negative for susceptible to infections.  Neurological: Negative for dizziness, light-headedness, numbness and memory loss.  Hematological: Negative for bruising/bleeding tendency and swollen glands.  Psychiatric/Behavioral: Negative for depressed mood, confusion and sleep disturbance. The patient is not nervous/anxious.     PMFS History:  Patient Active Problem List   Diagnosis Date Noted  . Elevated hemoglobin A1c 11/15/2017  . Primary osteoarthritis involving multiple joints 11/15/2017  . Moderate asthma with acute exacerbation 05/03/2017  . Routine general medical examination at a health care facility 02/08/2017  . Primary osteoarthritis of both feet 02/10/2016  . Hx of migraine headaches 02/07/2016  . History of hypothyroidism 02/07/2016  . History of gastroesophageal reflux (GERD) 02/07/2016  . Obese 12/30/2015  . Basal cell carcinoma of chest wall 10/05/2011  . Hypertension 09/01/2010  . Hyperlipidemia 02/27/2008  . Hypothyroidism 02/21/2007  . Migraine 02/21/2007  . VAGINITIS, ATROPHIC 02/21/2007  . Psoriasis 02/21/2007  . Sicca syndrome, unspecified ([place]) 02/21/2007  . GERD 06/29/2006  . Rheumatoid arthritis ([place]) 06/29/2006    Past Medical History:  Diagnosis Date  . High cholesterol   . Hypertension   . Irritable bowel   . Rheumatoid arthritis ([place])   . Thyroid disease     Family History  Problem Relation Age of Onset  . Hypertension Mother   . Cancer Mother   . Thyroid disease Mother   . Cancer Father   .  Stroke Maternal Grandmother    Past Surgical History:  Procedure Laterality Date  . ABDOMINAL HYSTERECTOMY    . TONSILLECTOMY    . TUBAL LIGATION     Social History   Social History Narrative  . Not on file   Immunization History   Administered Date(s) Administered  . H1N1 02/27/2008  . Td 01/19/2004  . Tdap 12/17/2014     Objective: Vital Signs: BP (!) 149/79 (BP Location: Left Arm, Patient Position: Sitting, Cuff Size: Normal)   Pulse (!) 107   Resp 13   Ht 5\' 8"  ([id number] m)   Wt 213 lb 12.8 oz (97 kg)   BMI 32.51 kg/m    Physical Exam Vitals signs and nursing note reviewed.  Constitutional:      Appearance: She is well-developed.  HENT:     Head: Normocephalic and atraumatic.  Eyes:     Conjunctiva/sclera: Conjunctivae normal.  Neck:     Musculoskeletal: Normal range of motion.  Cardiovascular:     Rate and Rhythm: Normal rate and regular rhythm.     Heart sounds: Normal heart sounds.  Pulmonary:     Effort: Pulmonary effort is normal.     Breath sounds: Normal breath sounds.  Abdominal:     General: Bowel sounds are normal.     Palpations: Abdomen is soft.  Lymphadenopathy:     Cervical: No cervical adenopathy.  Skin:    General: Skin is warm and dry.     Capillary Refill: Capillary refill takes less than 2 seconds.  Neurological:     Mental Status: She is alert and oriented to person, place, and time.  Psychiatric:        Behavior: Behavior normal.      Musculoskeletal Exam: C-spine, thoracic spine, and lumbar spine good ROM.  No midline spinal tenderness. Paraspinal muscle tenderness in the thoracic region. No SI joint tenderness.  Shoulder joints, elbow joints, wrist joints, MCPs, PIPs, and DIPs good ROM with no synovitis.  Complete fist formation bilaterally.  PIP and DIP synovial thickening consistent with osteoarthritis of both hands.  Hip joints, knee joints, ankle joints, MTPs, PIPs, and DIPs good ROM with no synovitis.  No warmth or effusion of knee joints.  No tenderness or swelling of ankle joints.    CDAI Exam: CDAI Score: 0.8  Patient Global: 6 mm; Provider Global: 2 mm Swollen: 0 ; Tender: 0  Joint Exam   No joint exam has been documented for this visit   There is  currently no information documented on the homunculus. Go to the Rheumatology activity and complete the homunculus joint exam.  Investigation: No additional findings.  Imaging: No results found.  Recent Labs: Lab Results  Component Value Date   WBC 7.2 09/13/2018   HGB 13.7 09/13/2018   PLT 332 09/13/2018   NA 142 09/13/2018   K 3.9 09/13/2018   CL 106 09/13/2018   CO2 26 09/13/2018   GLUCOSE 145 (H) 09/13/2018   BUN 16 09/13/2018   CREATININE 0.67 09/13/2018   BILITOT 0.2 09/13/2018   ALKPHOS 69 05/18/2018   AST 15 09/13/2018   ALT 9 09/13/2018   PROT 6.7 09/13/2018   ALBUMIN 4.4 05/18/2018   CALCIUM 10.2 09/13/2018   GFRAA 107 09/13/2018    Speciality Comments: No specialty comments available.  Procedures:  No procedures performed Allergies: Meperidine hcl and Pentazocine lactate     Assessment / Plan:     Visit Diagnoses: Rheumatoid arthritis involving multiple sites with positive rheumatoid  factor ([place]) - +RF: She has no synovitis on exam.  She is clinically doing well on methotrexate 0.8 mL sq injections once weekly and folic acid 2 mg by mouth daily.  She is having increased pain in both hands related to osteoarthritis.  She has no tenderness or synovitis on exam.  She is also some discomfort in bilateral ankle joints but has no tenderness or inflammation on exam.  She experiences increased arthralgias with seasonal changes.  She will continue on methotrexate and folic acid as prescribed.  She takes tramadol 50 mg 1 tablet in the morning 1 tablet in the evening as needed for pain relief.  She does not need any refills at this time.  She was advised to notify us if she develops increased joint pain or joint swelling.  She will follow-up in the office in 5 months  High risk medication use - Methotrexate 0.8 ml every 7 days and folic acid 1 mg 2 tablets daily. Most recent CBC/CMP within normal limits on 09/13/2018.  She will return for lab work in November and every 3  months.  Therapeutic drug monitoring - She takes tramadol 50 mg 1 tablet in the morning and 1 tablet at night.  She has been taking flexeril 10 mg po at bedtime.  We discussed the interaction between tramadol and flexeril.    Psoriasis: She has no active psoriasis at this time.   Primary osteoarthritis of both knees: She has good range of motion no discomfort.  No warmth or effusion was noted.  She experiences discomfort in bilateral knee joints when climbing steps.  Primary osteoarthritis of both feet: She has no tenderness or inflammation on exam.  She has been having increased pain in bilateral ankle joints but no tenderness or inflammation was noted.  Sicca syndrome ([place]) -She has persistent sicca symptoms.  She takes pilocarpine 5 mg 1 tablet by mouth TID PRN for symptomatic relief.  She does not need a refill at this time.   Muscle strain of upper back: She has been hunting on a regular basis and feels that she strained her upper back.  She has tenderness in the paraspinal muscles in the midthoracic region.  She has been taking Flexeril 10 mg by mouth at bedtime and tramadol as needed for pain relief.  She was given back exercises to perform.  She was advised to notify us if her symptoms persist or worsen.  Other medical conditions are listed as follows:  History of hyperlipidemia  History of gastroesophageal reflux (GERD)  History of migraine  History of hypothyroidism  Orders: No orders of the defined types were placed in this encounter.  No orders of the defined types were placed in this encounter.     Follow-Up Instructions: Return for Rheumatoid arthritis, Osteoarthritis.   Ofilia Neas, PA-C   I examined and evaluated the patient with Hazel Sams PA.  Patient complains of some discomfort in ankle joints.  But no synovitis was noted on my exam. The plan of care was discussed as noted above.  Bo Merino, MD  Note - This record has been created using [employer].  Chart creation errors have been sought, but may not always  have been located. Such creation errors do not reflect on  the standard of medical care.

## 2018-11-01 ENCOUNTER — Other Ambulatory Visit: Payer: Self-pay | Admitting: *Deleted

## 2018-11-01 ENCOUNTER — Encounter: Payer: Self-pay | Admitting: Physician Assistant

## 2018-11-01 ENCOUNTER — Ambulatory Visit: Payer: PPO | Admitting: Rheumatology

## 2018-11-01 ENCOUNTER — Other Ambulatory Visit: Payer: Self-pay

## 2018-11-01 VITALS — BP 149/79 | HR 107 | Resp 13 | Ht 68.0 in | Wt 213.8 lb

## 2018-11-01 DIAGNOSIS — Z8669 Personal history of other diseases of the nervous system and sense organs: Secondary | ICD-10-CM

## 2018-11-01 DIAGNOSIS — M0579 Rheumatoid arthritis with rheumatoid factor of multiple sites without organ or systems involvement: Secondary | ICD-10-CM | POA: Diagnosis not present

## 2018-11-01 DIAGNOSIS — Z79899 Other long term (current) drug therapy: Secondary | ICD-10-CM | POA: Diagnosis not present

## 2018-11-01 DIAGNOSIS — Z5181 Encounter for therapeutic drug level monitoring: Secondary | ICD-10-CM | POA: Diagnosis not present

## 2018-11-01 DIAGNOSIS — M170 Bilateral primary osteoarthritis of knee: Secondary | ICD-10-CM

## 2018-11-01 DIAGNOSIS — L409 Psoriasis, unspecified: Secondary | ICD-10-CM

## 2018-11-01 DIAGNOSIS — M19071 Primary osteoarthritis, right ankle and foot: Secondary | ICD-10-CM

## 2018-11-01 DIAGNOSIS — Z8719 Personal history of other diseases of the digestive system: Secondary | ICD-10-CM

## 2018-11-01 DIAGNOSIS — M19072 Primary osteoarthritis, left ankle and foot: Secondary | ICD-10-CM | POA: Diagnosis not present

## 2018-11-01 DIAGNOSIS — Z8639 Personal history of other endocrine, nutritional and metabolic disease: Secondary | ICD-10-CM | POA: Diagnosis not present

## 2018-11-01 DIAGNOSIS — M3500 Sicca syndrome, unspecified: Secondary | ICD-10-CM

## 2018-11-01 DIAGNOSIS — S29012A Strain of muscle and tendon of back wall of thorax, initial encounter: Secondary | ICD-10-CM | POA: Diagnosis not present

## 2018-11-01 MED ORDER — PILOCARPINE HCL 5 MG PO TABS: 5.0000 mg | ORAL_TABLET | Freq: Three times a day (TID) | ORAL | 0 refills | Status: DC

## 2018-11-01 NOTE — Telephone Encounter (Signed)
Refill request received via fax  Last Visit: 11/01/18 Next Visit: 04/05/19  Okay to refill per Dr. Estanislado Pandy

## 2018-11-01 NOTE — Patient Instructions (Addendum)
Standing Labs We placed an order today for your standing lab work.    Please come back and get your standing labs at the end of November and every 3 months   We have open lab daily Monday through Thursday from 8:30-12:30 PM and 1:30-4:30 PM and Friday from 8:30-12:30 PM and 1:30-4:00 PM at the office of Dr. Bo Merino.   You may experience shorter wait times on Monday and Friday afternoons. The office is located at [address] [place], [place], [place] [address] No appointment is necessary.   Labs are drawn by [facility].  You may receive a bill from [facility] for your lab work.  If you wish to have your labs drawn at another location, please call the office 24 hours in advance to send orders.  If you have any questions regarding directions or hours of operation,  please call [phone].   Just as a reminder please drink plenty of water prior to coming for your lab work. Thanks!    Back Exercises These exercises help to make your trunk and back strong. They also help to keep the lower back flexible. Doing these exercises can help to prevent back pain or lessen existing pain.  If you have back pain, try to do these exercises 2-3 times each day or as told by your doctor.  As you get better, do the exercises once each day. Repeat the exercises more often as told by your doctor.  To stop back pain from coming back, do the exercises once each day, or as told by your doctor. Exercises Single knee to chest Do these steps 3-5 times in a row for each leg: 1. Lie on your back on a firm bed or the floor with your legs stretched out. 2. Bring one knee to your chest. 3. Grab your knee or thigh with both hands and hold them it in place. 4. Pull on your knee until you feel a gentle stretch in your lower back or buttocks. 5. Keep doing the stretch for 10-30 seconds. 6. Slowly let go of your leg and straighten it. Pelvic tilt Do these steps 5-10 times in a row: 1. Lie on your back on a  firm bed or the floor with your legs stretched out. 2. Bend your knees so they point up to the ceiling. Your feet should be flat on the floor. 3. Tighten your lower belly (abdomen) muscles to press your lower back against the floor. This will make your tailbone point up to the ceiling instead of pointing down to your feet or the floor. 4. Stay in this position for 5-10 seconds while you gently tighten your muscles and breathe evenly. Cat-cow Do these steps until your lower back bends more easily: 1. Get on your hands and knees on a firm surface. Keep your hands under your shoulders, and keep your knees under your hips. You may put padding under your knees. 2. Let your head hang down toward your chest. Tighten (contract) the muscles in your belly. Point your tailbone toward the floor so your lower back becomes rounded like the back of a cat. 3. Stay in this position for 5 seconds. 4. Slowly lift your head. Let the muscles of your belly relax. Point your tailbone up toward the ceiling so your back forms a sagging arch like the back of a cow. 5. Stay in this position for 5 seconds.  Press-ups Do these steps 5-10 times in a row: 1. Lie on your belly (face-down) on the  floor. 2. Place your hands near your head, about shoulder-width apart. 3. While you keep your back relaxed and keep your hips on the floor, slowly straighten your arms to raise the top half of your body and lift your shoulders. Do not use your back muscles. You may change where you place your hands in order to make yourself more comfortable. 4. Stay in this position for 5 seconds. 5. Slowly return to lying flat on the floor.  Bridges Do these steps 10 times in a row: 1. Lie on your back on a firm surface. 2. Bend your knees so they point up to the ceiling. Your feet should be flat on the floor. Your arms should be flat at your sides, next to your body. 3. Tighten your butt muscles and lift your butt off the floor until your waist is  almost as high as your knees. If you do not feel the muscles working in your butt and the back of your thighs, slide your feet 1-2 inches farther away from your butt. 4. Stay in this position for 3-5 seconds. 5. Slowly lower your butt to the floor, and let your butt muscles relax. If this exercise is too easy, try doing it with your arms crossed over your chest. Belly crunches Do these steps 5-10 times in a row: 1. Lie on your back on a firm bed or the floor with your legs stretched out. 2. Bend your knees so they point up to the ceiling. Your feet should be flat on the floor. 3. Cross your arms over your chest. 4. Tip your chin a little bit toward your chest but do not bend your neck. 5. Tighten your belly muscles and slowly raise your chest just enough to lift your shoulder blades a tiny bit off of the floor. Avoid raising your body higher than that, because it can put too much stress on your low back. 6. Slowly lower your chest and your head to the floor. Back lifts Do these steps 5-10 times in a row: 1. Lie on your belly (face-down) with your arms at your sides, and rest your forehead on the floor. 2. Tighten the muscles in your legs and your butt. 3. Slowly lift your chest off of the floor while you keep your hips on the floor. Keep the back of your head in line with the curve in your back. Look at the floor while you do this. 4. Stay in this position for 3-5 seconds. 5. Slowly lower your chest and your face to the floor. Contact a doctor if:  Your back pain gets a lot worse when you do an exercise.  Your back pain does not get better 2 hours after you exercise. If you have any of these problems, stop doing the exercises. Do not do them again unless your doctor says it is okay. Get help right away if:  You have sudden, very bad back pain. If this happens, stop doing the exercises. Do not do them again unless your doctor says it is okay. This information is not intended to replace  advice given to you by your health care provider. Make sure you discuss any questions you have with your health care provider. Document Released: 02/06/2010 Document Revised: 09/29/2017 Document Reviewed: 09/29/2017 Elsevier Patient Education  2020 [employer].  Thoracic Strain Rehab Ask your health care provider which exercises are safe for you. Do exercises exactly as told by your health care provider and adjust them as directed. It is normal  to feel mild stretching, pulling, tightness, or discomfort as you do these exercises. Stop right away if you feel sudden pain or your pain gets worse. Do not begin these exercises until told by your health care provider. Stretching and range-of-motion exercise This exercise warms up your muscles and joints and improves the movement and flexibility of your back and shoulders. This exercise also helps to relieve pain. Chest and spine stretch  1. Lie down on your back on a firm surface. 2. Roll a towel or a small blanket so it is about 4 inches (10 cm) in diameter. 3. Put the towel lengthwise under the middle of your back so it is under your spine, but not under your shoulder blades. 4. Put your hands behind your head and let your elbows fall to your sides. This will increase your stretch. 5. Take a deep breath (inhale). 6. Hold for __________ seconds. 7. Relax after you breathe out (exhale). Repeat __________ times. Complete this exercise __________ times a day. Strengthening exercises These exercises build strength and endurance in your back and your shoulder blade muscles. Endurance is the ability to use your muscles for a long time, even after they get tired. Alternating arm and leg raises  1. Get on your hands and knees on a firm surface. If you are on a hard floor, you may want to use padding, such as an exercise mat, to cushion your knees. 2. Line up your arms and legs. Your hands should be directly below your shoulders, and your knees should be  directly below your hips. 3. Lift your left leg behind you. At the same time, raise your right arm and straighten it in front of you. ? Do not lift your leg higher than your hip. ? Do not lift your arm higher than your shoulder. ? Keep your abdominal and back muscles tight. ? Keep your hips facing the ground. ? Do not arch your back. ? Keep your balance carefully, and do not hold your breath. 4. Hold for __________ seconds. 5. Slowly return to the starting position and repeat with your right leg and your left arm. Repeat __________ times. Complete this exercise __________ times a day. Straight arm rows This exercise is also called shoulder extension exercise. 1. Stand with your feet shoulder width apart. 2. Secure an exercise band to a stable object in front of you so the band is at or above shoulder height. 3. Hold one end of the exercise band in each hand. 4. Straighten your elbows and lift your hands up to shoulder height. 5. Step back, away from the secured end of the exercise band, until the band stretches. 6. Squeeze your shoulder blades together and pull your hands down to the sides of your thighs. Stop when your hands are straight down by your sides. This is shoulder extension. Do not let your hands go behind your body. 7. Hold for __________ seconds. 8. Slowly return to the starting position. Repeat __________ times. Complete this exercise __________ times a day. Prone shoulder external rotation 1. Lie on your abdomen on a firm bed so your left / right forearm hangs over the edge of the bed and your upper arm is on the bed, straight out from your body. This is the prone position. ? Your elbow should be bent. ? Your palm should be facing your feet. 2. If instructed, hold a __________ weight in your hand. 3. Squeeze your shoulder blade toward the middle of your back. Do not let your  shoulder lift toward your ear. 4. Keep your elbow bent in a 90-degree angle (right angle) while you  slowly move your forearm up toward the ceiling. Move your forearm up to the height of the bed, toward your head. This is external rotation. ? Your upper arm should not move. ? At the top of the movement, your palm should face the floor. 5. Hold for __________ seconds. 6. Slowly return to the starting position and relax your muscles. Repeat __________ times. Complete this exercise __________ times a day. Rowing scapular retraction This is an exercise in which the shoulder blades (scapulae) are pulled toward each other (retraction). 1. Sit in a stable chair without armrests, or stand up. 2. Secure an exercise band to a stable object in front of you so the band is at shoulder height. 3. Hold one end of the exercise band in each hand. Your palms should face down. 4. Bring your arms out straight in front of you. 5. Step back, away from the secured end of the exercise band, until the band stretches. 6. Pull the band backward. As you do this, bend your elbows and squeeze your shoulder blades together, but avoid letting the rest of your body move. Do not shrug your shoulders upward while you do this. 7. Stop when your elbows are at your sides or slightly behind your body. 8. Hold for __________ seconds. 9. Slowly straighten your arms to return to the starting position. Repeat __________ times. Complete this exercise __________ times a day. Posture and body mechanics Good posture and healthy body mechanics can help to relieve stress in your body's tissues and joints. Body mechanics refers to the movements and positions of your body while you do your daily activities. Posture is part of body mechanics. Good posture means:  Your spine is in its natural S-curve position (neutral).  Your shoulders are pulled back slightly.  Your head is not tipped forward. Follow these guidelines to improve your posture and body mechanics in your everyday activities. Standing   When standing, keep your spine neutral  and your feet about hip width apart. Keep a slight bend in your knees. Your ears, shoulders, and hips should line up with each other.  When you do a task in which you lean forward while standing in one place for a long time, place one foot up on a stable object that is 2-4 inches (5-10 cm) high, such as a footstool. This helps keep your spine neutral. Sitting   When sitting, keep your spine neutral and keep your feet flat on the floor. Use a footrest, if necessary, and keep your thighs parallel to the floor. Avoid rounding your shoulders, and avoid tilting your head forward.  When working at a desk or a computer, keep your desk at a height where your hands are slightly lower than your elbows. Slide your chair under your desk so you are close enough to maintain good posture.  When working at a computer, place your monitor at a height where you are looking straight ahead and you do not have to tilt your head forward or downward to look at the screen. Resting When lying down and resting, avoid positions that are most painful for you.  If you have pain with activities such as sitting, bending, stooping, or squatting (flexion-basedactivities), lie in a position in which your body does not bend very much. For example, avoid curling up on your side with your arms and knees near your chest (fetal position).  If  you have pain with activities such as standing for a long time or reaching with your arms (extension-basedactivities), lie with your spine in a neutral position and bend your knees slightly. Try the following positions: ? Lie on your side with a pillow between your knees. ? Lie on your back with a pillow under your knees.  Lifting   When lifting objects, keep your feet at least shoulder width apart and tighten your abdominal muscles.  Bend your knees and hips and keep your spine neutral. It is important to lift using the strength of your legs, not your back. Do not lock your knees straight  out.  Always ask for help to lift heavy or awkward objects. This information is not intended to replace advice given to you by your health care provider. Make sure you discuss any questions you have with your health care provider. Document Released: 01/04/2005 Document Revised: 04/28/2018 Document Reviewed: 02/13/2018 Elsevier Patient Education  2020 [employer].

## 2018-11-04 ENCOUNTER — Other Ambulatory Visit: Payer: Self-pay | Admitting: Physician Assistant

## 2018-11-06 NOTE — Telephone Encounter (Signed)
Last Visit: 11/01/18 Next Visit: 04/05/19 UDS: 05/18/18 Narc Agreement: 05/18/18   Okay to refill Tramadol?

## 2018-11-12 ENCOUNTER — Other Ambulatory Visit: Payer: Self-pay | Admitting: Family Medicine

## 2018-11-23 ENCOUNTER — Other Ambulatory Visit: Payer: Self-pay | Admitting: Rheumatology

## 2018-11-23 NOTE — Telephone Encounter (Signed)
Last Visit: 11/01/18 Next Visit: 04/05/19 Labs: 09/13/18 Glucose is elevated-145. Rest of CMP WNL. CBC WNL  Okay to refill per Dr. Estanislado Pandy

## 2018-12-12 ENCOUNTER — Other Ambulatory Visit: Payer: Self-pay | Admitting: Physician Assistant

## 2018-12-13 NOTE — Telephone Encounter (Addendum)
Last Visit: 11/01/2018 Next Visit: 04/05/2019 UDS: 05/18/2018 c/w Narc Agreement: 05/22/2018   Last fill: 11/06/2018   Attempted to contact patient and left message on machine to advise patient she is due to update UDS as well as regular labs (at a [facility] or [facility]).   Okay to refill tramadol?

## 2018-12-13 NOTE — Telephone Encounter (Signed)
Ok to refill.  We cannot refill next month if she does not update UDS and narcotic agreement

## 2018-12-18 ENCOUNTER — Telehealth: Payer: Self-pay | Admitting: Rheumatology

## 2018-12-18 ENCOUNTER — Telehealth: Payer: Self-pay | Admitting: Pharmacy Technician

## 2018-12-18 DIAGNOSIS — Z79899 Other long term (current) drug therapy: Secondary | ICD-10-CM

## 2018-12-18 DIAGNOSIS — G8929 Other chronic pain: Secondary | ICD-10-CM

## 2018-12-18 DIAGNOSIS — Z5181 Encounter for therapeutic drug level monitoring: Secondary | ICD-10-CM

## 2018-12-18 NOTE — Telephone Encounter (Signed)
Received notification from [facility] regarding a prior authorization for MTX vial. Authorization has been APPROVED from 12/18/2018 to 12/17/2118.   Will send document to scan center.  Authorization # [id number] Phone # [phone]  10:46 AM Beatriz Chancellor, CPhT

## 2018-12-18 NOTE — Telephone Encounter (Signed)
Patient going to [facility] in [place] on [address]. Wednesday. Please release orders.

## 2018-12-18 NOTE — Telephone Encounter (Signed)
Submitted a Prior Authorization request to [employer] for MTX vial via Cover My Meds. Will update once we receive a response.  8:48 AM Beatriz Chancellor, CPhT

## 2018-12-19 NOTE — Telephone Encounter (Signed)
Released CBC, CMP and UDS for [facility]. Attempted to contact patient and left message on machine to advise patient.

## 2018-12-21 ENCOUNTER — Other Ambulatory Visit: Payer: Self-pay | Admitting: Family Medicine

## 2018-12-21 ENCOUNTER — Other Ambulatory Visit: Payer: Self-pay

## 2018-12-21 DIAGNOSIS — Z5181 Encounter for therapeutic drug level monitoring: Secondary | ICD-10-CM | POA: Diagnosis not present

## 2018-12-21 DIAGNOSIS — G8929 Other chronic pain: Secondary | ICD-10-CM | POA: Diagnosis not present

## 2018-12-21 DIAGNOSIS — Z79899 Other long term (current) drug therapy: Secondary | ICD-10-CM | POA: Diagnosis not present

## 2018-12-21 MED ORDER — LEVOTHYROXINE SODIUM 100 MCG PO TABS: ORAL_TABLET | ORAL | 0 refills | Status: DC

## 2018-12-22 LAB — CMP14+EGFR
ALT: 10 IU/L (ref 0–32)
AST: 15 IU/L (ref 0–40)
Albumin/Globulin Ratio: 2 (ref 1.2–2.2)
Albumin: 4.6 g/dL (ref 3.8–4.8)
Alkaline Phosphatase: 68 IU/L (ref 39–117)
BUN/Creatinine Ratio: 26 (ref 12–28)
BUN: 17 mg/dL (ref 8–27)
Bilirubin Total: 0.3 mg/dL (ref 0.0–1.2)
CO2: 23 mmol/L (ref 20–29)
Calcium: 10 mg/dL (ref 8.7–10.3)
Chloride: 108 mmol/L — ABNORMAL HIGH (ref 96–106)
Creatinine, Ser: 0.66 mg/dL (ref 0.57–1.00)
GFR calc Af Amer: 107 mL/min/{1.73_m2} (ref >59)
GFR calc non Af Amer: 93 mL/min/{1.73_m2} (ref >59)
Globulin, Total: 2.3 g/dL (ref 1.5–4.5)
Glucose: 142 mg/dL — ABNORMAL HIGH (ref 65–99)
Potassium: 3.8 mmol/L (ref 3.5–5.2)
Sodium: 144 mmol/L (ref 134–144)
Total Protein: 6.9 g/dL (ref 6.0–8.5)

## 2018-12-22 LAB — CBC WITH DIFFERENTIAL/PLATELET
Basophils Absolute: 0.1 10*3/uL (ref 0.0–0.2)
Basos: 1 %
EOS (ABSOLUTE): 0.2 10*3/uL (ref 0.0–0.4)
Eos: 2 %
Hematocrit: 42.3 % (ref 34.0–46.6)
Hemoglobin: 14.3 g/dL (ref 11.1–15.9)
Immature Grans (Abs): 0 10*3/uL (ref 0.0–0.1)
Immature Granulocytes: 0 %
Lymphocytes Absolute: 3.4 10*3/uL — ABNORMAL HIGH (ref 0.7–3.1)
Lymphs: 48 %
MCH: 31.2 pg (ref 26.6–33.0)
MCHC: 33.8 g/dL (ref 31.5–35.7)
MCV: 92 fL (ref 79–97)
Monocytes Absolute: 0.5 10*3/uL (ref 0.1–0.9)
Monocytes: 7 %
Neutrophils Absolute: 2.9 10*3/uL (ref 1.4–7.0)
Neutrophils: 42 %
Platelets: 325 10*3/uL (ref 150–450)
RBC: 4.59 x10E6/uL (ref 3.77–5.28)
RDW: 13.2 % (ref 11.7–15.4)
WBC: 7 10*3/uL (ref 3.4–10.8)

## 2018-12-22 NOTE — Telephone Encounter (Signed)
Glucose is elevated probably not fasting.  CBC is within normal limits.

## 2018-12-23 LAB — DRUG SCREEN, URINE
Amphetamines, Urine: NEGATIVE ng/mL
Barbiturate screen, urine: NEGATIVE ng/mL
Benzodiazepine Quant, Ur: NEGATIVE ng/mL
Cannabinoid Quant, Ur: NEGATIVE ng/mL
Cocaine (Metab.): NEGATIVE ng/mL
Opiate Quant, Ur: NEGATIVE ng/mL
PCP Quant, Ur: NEGATIVE ng/mL

## 2018-12-23 LAB — TRAMADOL SCREEN, URINE: Tramadol Screen, Urine: POSITIVE ng/mL — AB

## 2019-01-14 ENCOUNTER — Other Ambulatory Visit: Payer: Self-pay | Admitting: Family Medicine

## 2019-01-16 ENCOUNTER — Other Ambulatory Visit: Payer: Self-pay | Admitting: Internal Medicine

## 2019-01-16 DIAGNOSIS — E039 Hypothyroidism, unspecified: Secondary | ICD-10-CM

## 2019-01-16 MED ORDER — LEVOTHYROXINE SODIUM 100 MCG PO TABS: ORAL_TABLET | ORAL | 0 refills | Status: DC

## 2019-01-18 ENCOUNTER — Telehealth: Payer: Self-pay

## 2019-01-18 ENCOUNTER — Telehealth: Payer: Self-pay | Admitting: Internal Medicine

## 2019-01-18 DIAGNOSIS — I10 Essential (primary) hypertension: Secondary | ICD-10-CM

## 2019-01-18 MED ORDER — LISINOPRIL-HYDROCHLOROTHIAZIDE 20-12.5 MG PO TABS: 1.0000 | ORAL_TABLET | Freq: Every day | ORAL | 1 refills | Status: DC

## 2019-01-18 MED ORDER — PILOCARPINE HCL 5 MG PO TABS: 5.0000 mg | ORAL_TABLET | Freq: Three times a day (TID) | ORAL | 0 refills | Status: DC

## 2019-01-18 NOTE — Telephone Encounter (Signed)
Refill request received via fax from [facility] on [address] for pilocarpine.   Last Visit: 11/01/2018  Next Visit: 04/05/2019  Okay to refill per Dr. Estanislado Pandy.

## 2019-01-18 NOTE — Telephone Encounter (Signed)
Pt called about needing a refill for lisinopril-hydrochlorothiazide (ZESTORETIC) 20-12.5 MG tablet Pt is out of meds. Pt has a TOC scheduled for 02/08/2019. Please advise and Thank you!  Pharmacy is [facility] [phone] - [place], [address] AT [address]  Call pt @ [phone].

## 2019-01-21 ENCOUNTER — Other Ambulatory Visit: Payer: Self-pay | Admitting: Rheumatology

## 2019-01-21 ENCOUNTER — Telehealth: Payer: Self-pay | Admitting: Family Medicine

## 2019-01-21 DIAGNOSIS — Z1231 Encounter for screening mammogram for malignant neoplasm of breast: Secondary | ICD-10-CM

## 2019-01-21 NOTE — Telephone Encounter (Signed)
Please see when the patients last mammogram was completed. I received a reminder that Cheryl Knight had previously ordered a mammogram that had not been completed yet. If she has had one in the past year please find out where is was completed. Thanks.

## 2019-01-22 NOTE — Telephone Encounter (Signed)
Last Visit: 11/01/2018 Next Visit: 04/05/2019 Labs: 12/21/18 WNL   Okay to refill per Dr. Estanislado Pandy

## 2019-01-22 NOTE — Telephone Encounter (Signed)
I called and left a voicemail for the patient to call me back, okay for PEC to advise.  Please see when the patients last mammogram was completed. I received a reminder that Cheryl Knight had previously ordered a mammogram that had not been completed yet. If she has had one in the past year please find out where is was completed. Thanks. Mackie Goon,cma

## 2019-01-23 NOTE — Telephone Encounter (Signed)
Pt returned phone call. She can not remember exact date of last mammogram, but she is due for one. She goes to [facility] in [place].

## 2019-01-23 NOTE — Telephone Encounter (Signed)
This was a patient of L. Guse and she states she needs a mammogram she does not remember when her last one was but she goes to [facility] in [place]. Lauren put in a referral and I wanted to make sure it was with [facility].  Cadience Bradfield,cma

## 2019-01-24 DIAGNOSIS — H2512 Age-related nuclear cataract, left eye: Secondary | ICD-10-CM | POA: Diagnosis not present

## 2019-01-24 DIAGNOSIS — H2513 Age-related nuclear cataract, bilateral: Secondary | ICD-10-CM | POA: Diagnosis not present

## 2019-01-24 DIAGNOSIS — H40013 Open angle with borderline findings, low risk, bilateral: Secondary | ICD-10-CM | POA: Diagnosis not present

## 2019-01-24 DIAGNOSIS — H25013 Cortical age-related cataract, bilateral: Secondary | ICD-10-CM | POA: Diagnosis not present

## 2019-01-24 DIAGNOSIS — H35033 Hypertensive retinopathy, bilateral: Secondary | ICD-10-CM | POA: Diagnosis not present

## 2019-01-25 NOTE — Addendum Note (Signed)
Addended by: Caryl Bis, Pleshette Tomasini G on: 01/25/2019 10:30 AM   Modules accepted: Orders

## 2019-01-25 NOTE — Telephone Encounter (Signed)
Mammogram ordered. To be completed at [facility]. I will forward to Melissa to get this sent to them.

## 2019-02-08 ENCOUNTER — Encounter: Payer: Self-pay | Admitting: Internal Medicine

## 2019-02-08 ENCOUNTER — Other Ambulatory Visit: Payer: Self-pay

## 2019-02-08 ENCOUNTER — Ambulatory Visit (INDEPENDENT_AMBULATORY_CARE_PROVIDER_SITE_OTHER): Payer: PPO | Admitting: Internal Medicine

## 2019-02-08 VITALS — BP 128/84 | Ht 68.0 in | Wt 215.0 lb

## 2019-02-08 DIAGNOSIS — Z23 Encounter for immunization: Secondary | ICD-10-CM | POA: Diagnosis not present

## 2019-02-08 DIAGNOSIS — Z1329 Encounter for screening for other suspected endocrine disorder: Secondary | ICD-10-CM

## 2019-02-08 DIAGNOSIS — I10 Essential (primary) hypertension: Secondary | ICD-10-CM | POA: Diagnosis not present

## 2019-02-08 DIAGNOSIS — J302 Other seasonal allergic rhinitis: Secondary | ICD-10-CM | POA: Diagnosis not present

## 2019-02-08 DIAGNOSIS — M545 Low back pain, unspecified: Secondary | ICD-10-CM

## 2019-02-08 DIAGNOSIS — M1990 Unspecified osteoarthritis, unspecified site: Secondary | ICD-10-CM | POA: Diagnosis not present

## 2019-02-08 DIAGNOSIS — E785 Hyperlipidemia, unspecified: Secondary | ICD-10-CM | POA: Diagnosis not present

## 2019-02-08 DIAGNOSIS — Z8669 Personal history of other diseases of the nervous system and sense organs: Secondary | ICD-10-CM

## 2019-02-08 DIAGNOSIS — Z1211 Encounter for screening for malignant neoplasm of colon: Secondary | ICD-10-CM | POA: Diagnosis not present

## 2019-02-08 DIAGNOSIS — E1165 Type 2 diabetes mellitus with hyperglycemia: Secondary | ICD-10-CM | POA: Insufficient documentation

## 2019-02-08 DIAGNOSIS — E2839 Other primary ovarian failure: Secondary | ICD-10-CM

## 2019-02-08 DIAGNOSIS — J309 Allergic rhinitis, unspecified: Secondary | ICD-10-CM | POA: Insufficient documentation

## 2019-02-08 DIAGNOSIS — Z1231 Encounter for screening mammogram for malignant neoplasm of breast: Secondary | ICD-10-CM | POA: Diagnosis not present

## 2019-02-08 DIAGNOSIS — E039 Hypothyroidism, unspecified: Secondary | ICD-10-CM

## 2019-02-08 DIAGNOSIS — J40 Bronchitis, not specified as acute or chronic: Secondary | ICD-10-CM

## 2019-02-08 DIAGNOSIS — E119 Type 2 diabetes mellitus without complications: Secondary | ICD-10-CM

## 2019-02-08 DIAGNOSIS — E559 Vitamin D deficiency, unspecified: Secondary | ICD-10-CM

## 2019-02-08 MED ORDER — SHINGRIX 50 MCG/0.5ML IM SUSR: 0.5000 mL | Freq: Once | INTRAMUSCULAR | 0 refills | Status: AC

## 2019-02-08 MED ORDER — CYCLOBENZAPRINE HCL 10 MG PO TABS: 10.0000 mg | ORAL_TABLET | Freq: Every day | ORAL | 1 refills | Status: DC

## 2019-02-08 MED ORDER — TOPIRAMATE 100 MG PO TABS: 100.0000 mg | ORAL_TABLET | Freq: Every day | ORAL | 3 refills | Status: DC

## 2019-02-08 MED ORDER — ALBUTEROL SULFATE HFA 108 (90 BASE) MCG/ACT IN AERS: 1.0000 | INHALATION_SPRAY | Freq: Four times a day (QID) | RESPIRATORY_TRACT | 11 refills | Status: DC | PRN

## 2019-02-08 MED ORDER — FLUTICASONE PROPIONATE 50 MCG/ACT NA SUSP: 2.0000 | Freq: Every day | NASAL | 11 refills | Status: DC

## 2019-02-08 MED ORDER — OLMESARTAN MEDOXOMIL-HCTZ 20-12.5 MG PO TABS: 1.0000 | ORAL_TABLET | Freq: Every day | ORAL | 3 refills | Status: DC

## 2019-02-08 MED ORDER — LEVOTHYROXINE SODIUM 100 MCG PO TABS: ORAL_TABLET | ORAL | 3 refills | Status: DC

## 2019-02-08 MED ORDER — SUMATRIPTAN SUCCINATE 50 MG PO TABS: ORAL_TABLET | ORAL | 11 refills | Status: DC

## 2019-02-08 MED ORDER — PRAVASTATIN SODIUM 20 MG PO TABS: 20.0000 mg | ORAL_TABLET | ORAL | 3 refills | Status: DC

## 2019-02-08 NOTE — Progress Notes (Addendum)
Virtual Visit via Video Note  I connected with Cheryl Knight   on 02/08/19 at  2:40 PM EST by a video enabled telemedicine application and verified that I am speaking with the correct person using two identifiers.  Location patient: home Location provider:work or home office Persons participating in the virtual visit: patient, provider  I discussed the limitations of evaluation and management by telemedicine and the availability of in person appointments. The patient expressed understanding and agreed to proceed.   HPI: 1. HTN BP 128/84 on lisc hctz 20-12.5 mg qd  2. C/o cough Friday and sat 1 week ago and tested covid 19 negative. Cough was dry she does have allergies to dust and ragweed which is typically worse in sept, jan, march/april and she has to use an inhaler rarely but has expired ventolin inhaler. She has a h/o smoking remotely and quit 1 year ago but does smoke rarely still socially like 2x per year if with her friends. She does  Have GERD which she uses nexium 20 mg bid She had wheezing with the cough but this has improved  3. Migraines needs refills of her medications migraines controlled  4. Hypothyroidism levo on 100 mcg qd will repeat labs  ROS: See pertinent positives and negatives per HPI.  Past Medical History:  Diagnosis Date  . Cataracts, bilateral    tbd surgery 2021  . High cholesterol   . Hypertension   . Irritable bowel   . Osteoarthritis   . Osteoarthritis    i.e right knee   . Rheumatoid arthritis ([place])   . Thyroid disease     Past Surgical History:  Procedure Laterality Date  . ABDOMINAL HYSTERECTOMY     age 31 ovaries out  . TONSILLECTOMY    . TUBAL LIGATION      Family History  Problem Relation Age of Onset  . Hypertension Mother   . Cancer Mother        breast dx age 36 died age 69   . Thyroid disease Mother   . Cancer Father   . 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  betamethasone dipropionate (DIPROLENE) 0.05 % cream, Apply topically 2 (two) times daily., Disp: 45 g, Rfl: 3 .  Calcium Carbonate-Vitamin D (CALCIUM PLUS VITAMIN D PO), Take by mouth., Disp: , Rfl:  .  cetirizine (ZYRTEC) 10 MG tablet, Take 10 mg by mouth daily., Disp: , Rfl:  .  conjugated estrogens (PREMARIN) vaginal cream, Place 1 Applicatorful vaginally daily., Disp: 42.5 g, Rfl: 12 .  cyclobenzaprine (FLEXERIL) 10 MG tablet, Take 1 tablet (10 mg total) by mouth daily., Disp: 90 tablet, Rfl: 1 .  esomeprazole (NEXIUM) 40 MG capsule, 1 by mouth twice a day, Disp: 200 capsule, Rfl: 4 .  fish oil-omega-3 fatty acids 1000 MG capsule, Take 1 g by mouth daily., Disp: , Rfl:  .  fluticasone (FLONASE) 50 MCG/ACT nasal spray, Place 2 sprays into both nostrils daily., Disp: 16 g, Rfl: 11 .  folic acid (FOLVITE) 1 MG tablet, Take 2 tablets (2 mg total) by mouth daily., Disp: 180 tablet, Rfl: 3 .  glucosamine-chondroitin 500-400 MG tablet, Take 1 tablet by mouth 3 (three) times daily., Disp: , Rfl:  .  HYDROcodone-acetaminophen (NORCO) 7.5-325 MG tablet, Take 1 tab by mouth twice daily as needed for migraine, Disp: 60 tablet, Rfl: 0 .  levothyroxine (SYNTHROID) 100  MCG tablet, Daily 30 minutes before food, Disp: 90 tablet, Rfl: 3 .  Methotrexate Sodium (METHOTREXATE, PF,) 50 MG/2ML injection, INJECT 0.8ML INTO THE SKIN ONCE A WEEK, Disp: 10 mL, Rfl: 0 .  pilocarpine (SALAGEN) 5 MG tablet, Take 1 tablet (5 mg total) by mouth 3 (three) times daily., Disp: 270 tablet, Rfl: 0 .  [START ON 02/09/2019] pravastatin (PRAVACHOL) 20 MG tablet, Take 1 tablet (20 mg total) by mouth every Monday, Wednesday, and Friday., Disp: 90 tablet, Rfl: 3 .  predniSONE (DELTASONE) 5 MG tablet, Take 4 tablets by mouth daily for 2 days, 3 tablets by mouth for 2 days, 2 tablets by mouth for 2 days, 1 tablet by mouth for 2 days., Disp: 20 tablet, Rfl: 0 .  promethazine (PHENERGAN) 25 MG tablet, Take 1 tablet  (25 mg total) by mouth every 6 (six) hours as needed., Disp: 30 tablet, Rfl: 2 .  RESTASIS 0.05 % ophthalmic emulsion, , Disp: , Rfl:  .  SUMAtriptan (IMITREX) 50 MG tablet, TAKE 1 TABLET BY MOUTH AS NEEDED FOR HEADACHE, Disp: 9 tablet, Rfl: 11 .  topiramate (TOPAMAX) 100 MG tablet, Take 1 tablet (100 mg total) by mouth daily., Disp: 90 tablet, Rfl: 3 .  traMADol (ULTRAM) 50 MG tablet, TAKE 2 TABLETS BY MOUTH TWICE DAILY AS NEEDED FOR PAIN RELIEF, Disp: 120 tablet, Rfl: 0 .  albuterol (VENTOLIN HFA) 108 (90 Base) MCG/ACT inhaler, Inhale 1-2 puffs into the lungs every 6 (six) hours as needed for wheezing or shortness of breath., Disp: 18 g, Rfl: 11 .  olmesartan-hydrochlorothiazide (BENICAR HCT) 20-12.5 MG tablet, Take 1 tablet by mouth daily. In am, Disp: 90 tablet, Rfl: 3 .  Zoster Vaccine Adjuvanted [facility]) injection, Inject 0.5 mLs into the muscle once for 1 dose., Disp: 0.5 mL, Rfl: 0 .  Zoster Vaccine Adjuvanted (SHINGRIX) injection, Inject 0.5 mLs into the muscle once for 1 dose., Disp: 0.5 mL, Rfl: 0  EXAM:  VITALS per patient if applicable:  GENERAL: alert, oriented, appears well and in no acute distress  HEENT: atraumatic, conjunttiva clear, no obvious abnormalities on inspection of external nose and ears  NECK: normal movements of the head and neck  LUNGS: on inspection no signs of respiratory distress, breathing rate appears normal, no obvious gross SOB, gasping or wheezing  CV: no obvious cyanosis  MS: moves all visible extremities without noticeable abnormality  PSYCH/NEURO: pleasant and cooperative, no obvious depression or anxiety, speech and thought processing grossly intact  ASSESSMENT AND PLAN:  Discussed the following assessment and plan:  Essential hypertension/HLD - Plan: olmesartan-hydrochlorothiazide (BENICAR HCT) 20-12.5 MG tablet d/c lis 20-hctz 12.5 due to cough  pravastatin (PRAVACHOL) 20 MG tablet Fasting labs 04/18/19  History of migraine headaches  controlled - Plan: topiramate (TOPAMAX) 100 MG tablet, SUMAtriptan (IMITREX) 50 MG tablet  Hypothyroidism, unspecified type - Plan: levothyroxine (SYNTHROID) 100 MCG tablet, TSH  Seasonal allergies  ? Bronchitis vs allergic asthma - Plan: fluticasone (FLONASE) 50 MCG/ACT nasal spray, albuterol (VENTOLIN HFA) 108 (90 Base) MCG/ACT inhaler Cont otc ah I.e zyrte  Low back pain, unspecified back pain laterality, unspecified chronicity, unspecified whether sciatica present - Plan: cyclobenzaprine (FLEXERIL) 10 MG tablet  Osteoarthritis/RA f/u rheum Dr. Estanislado Pandy [facility] labs from 04/18/19 fasting   Type 2 diabetes mellitus without complication H6D 6.8 01/4968  F/u with Dr. Herbert Deaner pending cataract surgery b/l starting 02/13/19   HM Flu shot declines  Tdap utd due in 2026  prevnar disc today could consider in the future  pna  21 disc today could consider in the future  shingrix ins wouldnt cover  covid 19 vaccine tbd pt reports she normally does not get vaccines but considering this and is scheduled   Labs fasting 04/18/19 will [facility] rheumatology Dr. Estanislado Pandy Skin dermatology saw summer 2020 f/u yearly h/o nmsc chest  mammo [facility] 02/22/19 abnormal and repeat 03/09/19 right breast mass right breast rec biopsy did she get this?   Pap s/p total hysterectomy due to uterine prolapse h/o abnormal pap remotely ovaries removed  Colonoscopy will refer Dr. Hilarie Fredrickson [facility] she is due  dexa get [facility] upcoming 02/22/19 with mammo  Former smoker quit 1 year ago in 2019/2020 but ocass. Light smoking 2x per year socially  Pending cataract surgery 02/13/19 Dr. Herbert Deaner  Surgery note 03/21/19 Dr. Georgette Dover   INVASIVE DUCTAL CARCINOMA OF RIGHT BREAST, STAGE 1 (C50.911) Impression: RLOQ 5 mm 5 cmfn IDC, ER/PR +, Her2 - 04/24/19 [facility] f/u Dr. Georgette Dover 03/28/19 right radioactive seed lumpectomy and sentinel ln bx invasive ductal ca 0.9 cm with some surrounding DCIS margins negative 2 nodes negative begin radiation next week with HT after  radiation  F/u in 3 months   Current Plans Schedule for Surgery - Right radioactive seed localized lumpectomy with sentinel lymph node biopsy. The surgical procedure has been discussed with the patient. Potential risks, benefits, alternative treatments, and expected outcomes have been explained. All of the patient's questions at this time have been answered. The likelihood of reaching the patient's treatment goal is good. The patient understand the proposed surgical procedure and wishes to proceed. Note:MDC - The patient will receive adjuvant radiation and probable anti-estrogens.     -we discussed possible serious and likely etiologies, options for evaluation and workup, limitations of telemedicine visit vs in person visit, treatment, treatment risks and precautions. Pt prefers to treat via telemedicine empirically rather then risking or undertaking an in person visit at this moment. Patient agrees to seek prompt in person care if worsening, new symptoms arise, or if is not improving with treatment.   I discussed the assessment and treatment plan with the patient. The patient was provided an opportunity to ask questions and all were answered. The patient agreed with the plan and demonstrated an understanding of the instructions.   The patient was advised to call back or seek an in-person evaluation if the symptoms worsen or if the condition fails to improve as anticipated.  Time spent 30-39 minutes  Delorise Jackson, MD

## 2019-02-08 NOTE — Patient Instructions (Addendum)
Results for Cheryl Knight, KRILL (MRN [id number]) as of 02/08/2019 19:06  Ref. Range 02/14/2018 08:19  Hemoglobin A1C Latest Ref Range: 4.8 - 5.6 % 6.8 (H)   Your last A1C indicated you have diabetes  Please try healthy diet choices and exercise we will see with repeat labs what this is  Diabetes A1c >6.5  Prediabetes A1C 5.7-6.4 No prediabetes A1C 5.6 or less    Budget-Friendly Healthy Eating There are many ways to save money at the grocery store and continue to eat healthy. You can be successful if you:  Plan meals according to your budget.  Make a grocery list and only purchase food according to your grocery list.  Prepare food yourself. What are tips for following this plan?  Reading food labels  Compare food labels between brand name foods and the store brand. Often the nutritional value is the same, but the store brand is lower cost.  Look for products that do not have added sugar, fat, or salt (sodium). These often cost the same but are healthier for you. Products may be labeled as: ? Sugar-free. ? Nonfat. ? Low-fat. ? Sodium-free. ? Low-sodium.  Look for lean ground beef labeled as at least 92% lean and 8% fat. Shopping  Buy only the items on your grocery list and go only to the areas of the store that have the items on your list.  Use coupons only for foods and brands you normally buy. Avoid buying items you wouldn't normally buy simply because they are on sale.  Check online and in newspapers for weekly deals.  Buy healthy items from the bulk bins when available, such as herbs, spices, flour, pasta, nuts, and dried fruit.  Buy fruits and vegetables that are in season. Prices are usually lower on in-season produce.  Look at the unit price on the price tag. Use it to compare different brands and sizes to find out which item is the best deal.  Choose healthy items that are often low-cost, such as carrots, potatoes, apples, bananas, and oranges. Dried or canned beans  are a low-cost protein source.  Buy in bulk and freeze extra food. Items you can buy in bulk include meats, fish, poultry, frozen fruits, and frozen vegetables.  Avoid buying "ready-to-eat" foods, such as pre-cut fruits and vegetables and pre-made salads.  If possible, shop around to discover where you can find the best prices. Consider other retailers such as dollar stores, larger [employer], local fruit and vegetable stands, and farmers markets.  Do not shop when you are hungry. If you shop while hungry, it may be hard to stick to your list and budget.  Resist impulse buying. Use your grocery list as your official plan for the week.  Buy a variety of vegetables and fruits by purchasing fresh, frozen, and canned items.  Look at the top and bottom shelves for deals. Foods at eye level (eye level of an adult or child) are usually more expensive.  Be efficient with your time when shopping. The more time you spend at the store, the more money you are likely to spend.  To save money when choosing more expensive foods like meats and dairy: ? Choose cheaper cuts of meat, such as bone-in chicken thighs and drumsticks instead of skinless and boneless chicken. When you are ready to prepare the chicken, you can remove the skin yourself to make it healthier. ? Choose lean meats like chicken or [place] instead of beef. ? Choose canned seafood, such as tuna,  salmon, or sardines. ? Buy eggs as a low-cost source of protein. ? Buy dried beans and peas, such as lentils, split peas, or kidney beans instead of meats. Dried beans and peas are a good alternative source of protein. ? Buy the larger tubs of yogurt instead of individual-sized containers.  Choose water instead of sodas and other sweetened beverages.  Avoid buying chips, cookies, and other "junk food." These items are usually expensive and not healthy. Cooking  Make extra food and freeze the extras in meal-sized containers or in individual  portions for fast meals and snacks.  Pre-cook on days when you have extra time to prepare meals in advance. You can keep these meals in the fridge or freezer and reheat for a quick meal.  When you come home from the grocery store, wash, peel, and cut fruits and vegetables so they are ready to use and eat. This will help reduce food waste. Meal planning  Do not eat out or get fast food. Prepare food at home.  Make a grocery list and make sure to bring it with you to the store. If you have a smart phone, you could use your phone to create your shopping list.  Plan meals and snacks according to a grocery list and budget you create.  Use leftovers in your meal plan for the week.  Look for recipes where you can cook once and make enough food for two meals.  Include budget-friendly meals like stews, casseroles, and stir-fry dishes.  Try some meatless meals or try "no cook" meals like salads.  Make sure that half your plate is filled with fruits or vegetables. Choose from fresh, frozen, or canned fruits and vegetables. If eating canned, remember to rinse them before eating. This will remove any excess salt added for packaging. Summary  Eating healthy on a budget is possible if you plan your meals according to your budget, purchase according to your budget and grocery list, and prepare food yourself.  Tips for buying more food on a limited budget include buying generic brands, using coupons only for foods you normally buy, and buying healthy items from the bulk bins when available.  Tips for buying cheaper food to replace expensive food include choosing cheaper, lean cuts of meat, and buying dried beans and peas. This information is not intended to replace advice given to you by your health care provider. Make sure you discuss any questions you have with your health care provider. Document Revised: 01/05/2017 Document Reviewed: 01/05/2017 Elsevier Patient Education  2020 [employer].  Prediabetes Eating Plan Prediabetes is a condition that causes blood sugar (glucose) levels to be higher than normal. This increases the risk for developing diabetes. In order to prevent diabetes from developing, your health care provider may recommend a diet and other lifestyle changes to help you:  Control your blood glucose levels.  Improve your cholesterol levels.  Manage your blood pressure. Your health care provider may recommend working with a diet and nutrition specialist (dietitian) to make a meal plan that is best for you. What are tips for following this plan? Lifestyle  Set weight loss goals with the help of your health care team. It is recommended that most people with prediabetes lose 7% of their current body weight.  Exercise for at least 30 minutes at least 5 days a week.  Attend a support group or seek ongoing support from a mental health counselor.  Take over-the-counter and prescription medicines only as told by your health  care provider. Reading food labels  Read food labels to check the amount of fat, salt (sodium), and sugar in prepackaged foods. Avoid foods that have: ? Saturated fats. ? Trans fats. ? Added sugars.  Avoid foods that have more than 300 milligrams (mg) of sodium per serving. Limit your daily sodium intake to less than 2,300 mg each day. Shopping  Avoid buying pre-made and processed foods. Cooking  Cook with olive oil. Do not use butter, lard, or ghee.  Bake, broil, grill, or boil foods. Avoid frying. Meal planning   Work with your dietitian to develop an eating plan that is right for you. This may include: ? Tracking how many calories you take in. Use a food diary, notebook, or mobile application to track what you eat at each meal. ? Using the glycemic index (GI) to plan your meals. The index tells you how quickly a food will raise your blood glucose. Choose low-GI foods. These foods take a longer time to raise blood  glucose.  Consider following a Mediterranean diet. This diet includes: ? Several servings each day of fresh fruits and vegetables. ? Eating fish at least twice a week. ? Several servings each day of whole grains, beans, nuts, and seeds. ? Using olive oil instead of other fats. ? Moderate alcohol consumption. ? Eating small amounts of red meat and whole-fat dairy.  If you have high blood pressure, you may need to limit your sodium intake or follow a diet such as the DASH eating plan. DASH is an eating plan that aims to lower high blood pressure. What foods are recommended? The items listed below may not be a complete list. Talk with your dietitian about what dietary choices are best for you. Grains Whole grains, such as whole-wheat or whole-grain breads, crackers, cereals, and pasta. Unsweetened oatmeal. Bulgur. Barley. Quinoa. Brown rice. Corn or whole-wheat flour tortillas or taco shells. Vegetables Lettuce. Spinach. Peas. Beets. Cauliflower. Cabbage. Broccoli. Carrots. Tomatoes. Squash. Eggplant. Herbs. Peppers. Onions. Cucumbers. Brussels sprouts. Fruits Berries. Bananas. Apples. Oranges. Grapes. Papaya. Mango. Pomegranate. Kiwi. Grapefruit. Cherries. Meats and other protein foods Seafood. Poultry without skin. Lean cuts of pork and beef. Tofu. Eggs. Nuts. Beans. Dairy Low-fat or fat-free dairy products, such as yogurt, cottage cheese, and cheese. Beverages Water. Tea. Coffee. Sugar-free or diet soda. Seltzer water. Lowfat or no-fat milk. Milk alternatives, such as soy or almond milk. Fats and oils Olive oil. Canola oil. Sunflower oil. Grapeseed oil. Avocado. Walnuts. Sweets and desserts Sugar-free or low-fat pudding. Sugar-free or low-fat ice cream and other frozen treats. Seasoning and other foods Herbs. Sodium-free spices. Mustard. Relish. Low-fat, low-sugar ketchup. Low-fat, low-sugar barbecue sauce. Low-fat or fat-free mayonnaise. What foods are not recommended? The items  listed below may not be a complete list. Talk with your dietitian about what dietary choices are best for you. Grains Refined white flour and flour products, such as bread, pasta, snack foods, and cereals. Vegetables Canned vegetables. Frozen vegetables with butter or cream sauce. Fruits Fruits canned with syrup. Meats and other protein foods Fatty cuts of meat. Poultry with skin. Breaded or fried meat. Processed meats. Dairy Full-fat yogurt, cheese, or milk. Beverages Sweetened drinks, such as sweet iced tea and soda. Fats and oils Butter. Lard. Ghee. Sweets and desserts Baked goods, such as cake, cupcakes, pastries, cookies, and cheesecake. Seasoning and other foods Spice mixes with added salt. Ketchup. Barbecue sauce. Mayonnaise. Summary  To prevent diabetes from developing, you may need to make diet and other lifestyle changes to  help control blood sugar, improve cholesterol levels, and manage your blood pressure.  Set weight loss goals with the help of your health care team. It is recommended that most people with prediabetes lose 7 percent of their current body weight.  Consider following a Mediterranean diet that includes plenty of fresh fruits and vegetables, whole grains, beans, nuts, seeds, fish, lean meat, low-fat dairy, and healthy oils. This information is not intended to replace advice given to you by your health care provider. Make sure you discuss any questions you have with your health care provider. Document Revised: 04/28/2018 Document Reviewed: 03/10/2016 Elsevier Patient Education  2020 [employer].

## 2019-02-09 ENCOUNTER — Other Ambulatory Visit: Payer: Self-pay | Admitting: *Deleted

## 2019-02-09 MED ORDER — TRAMADOL HCL 50 MG PO TABS: ORAL_TABLET | ORAL | 0 refills | Status: DC

## 2019-02-09 NOTE — Telephone Encounter (Signed)
Refill request received via fax   Last Visit: 11/01/2018 Next Visit: 04/05/2019 UDS: 12/21/2018 Narc Agreement: 05/22/2018   Last Fill: 12/13/2018  Okay to refill Tramadol?

## 2019-02-13 DIAGNOSIS — H25812 Combined forms of age-related cataract, left eye: Secondary | ICD-10-CM | POA: Diagnosis not present

## 2019-02-13 DIAGNOSIS — H2512 Age-related nuclear cataract, left eye: Secondary | ICD-10-CM | POA: Diagnosis not present

## 2019-02-19 DIAGNOSIS — C801 Malignant (primary) neoplasm, unspecified: Secondary | ICD-10-CM

## 2019-02-19 HISTORY — DX: Malignant (primary) neoplasm, unspecified: C80.1

## 2019-02-20 ENCOUNTER — Other Ambulatory Visit: Payer: Self-pay | Admitting: Rheumatology

## 2019-02-20 NOTE — Telephone Encounter (Signed)
Last Visit:11/01/2018 Next Visit:04/05/2019 Labs: 12/21/18 WNL   Okay to refill per Dr. Estanislado Pandy

## 2019-02-22 DIAGNOSIS — Z780 Asymptomatic menopausal state: Secondary | ICD-10-CM | POA: Diagnosis not present

## 2019-02-22 DIAGNOSIS — M8589 Other specified disorders of bone density and structure, multiple sites: Secondary | ICD-10-CM | POA: Diagnosis not present

## 2019-02-22 DIAGNOSIS — Z1231 Encounter for screening mammogram for malignant neoplasm of breast: Secondary | ICD-10-CM | POA: Diagnosis not present

## 2019-02-22 LAB — HM MAMMOGRAPHY: HM Mammogram: ABNORMAL — AB (ref 0–4)

## 2019-02-22 LAB — HM DEXA SCAN

## 2019-02-26 ENCOUNTER — Telehealth: Payer: Self-pay | Admitting: Internal Medicine

## 2019-02-26 ENCOUNTER — Encounter: Payer: Self-pay | Admitting: Internal Medicine

## 2019-02-26 NOTE — Telephone Encounter (Signed)
Bone density osteopenia=thin bones rec vitamin D3 4000 Iu daily and calcium 600 mg 1-2 x per day   [place]

## 2019-02-27 NOTE — Telephone Encounter (Signed)
Left message for patient to return call back.  

## 2019-02-28 NOTE — Telephone Encounter (Signed)
Patient has been notified

## 2019-02-28 NOTE — Telephone Encounter (Signed)
Pt is requesting a call back. 

## 2019-03-01 ENCOUNTER — Telehealth: Payer: Self-pay | Admitting: Internal Medicine

## 2019-03-01 ENCOUNTER — Encounter: Payer: Self-pay | Admitting: Internal Medicine

## 2019-03-01 DIAGNOSIS — N6310 Unspecified lump in the right breast, unspecified quadrant: Secondary | ICD-10-CM

## 2019-03-01 NOTE — Telephone Encounter (Signed)
Possibly Right breast mass mammo 02/22/19 rec further images from  [facility] in [place] Call to get scheduled asap  Inform pt will need mammogram f/u  Bryan

## 2019-03-02 NOTE — Telephone Encounter (Signed)
Called Pt back with information from Dr. Aundra Dubin. Pt stated someone from the office already called her and she has an appt scheduled for 03/08/2019.

## 2019-03-05 NOTE — Telephone Encounter (Signed)
Noted  TMS 

## 2019-03-07 DIAGNOSIS — H2511 Age-related nuclear cataract, right eye: Secondary | ICD-10-CM | POA: Diagnosis not present

## 2019-03-07 DIAGNOSIS — H25011 Cortical age-related cataract, right eye: Secondary | ICD-10-CM | POA: Diagnosis not present

## 2019-03-09 DIAGNOSIS — N6313 Unspecified lump in the right breast, lower outer quadrant: Secondary | ICD-10-CM | POA: Diagnosis not present

## 2019-03-09 DIAGNOSIS — R928 Other abnormal and inconclusive findings on diagnostic imaging of breast: Secondary | ICD-10-CM | POA: Diagnosis not present

## 2019-03-09 LAB — HM MAMMOGRAPHY: HM Mammogram: ABNORMAL — AB (ref 0–4)

## 2019-03-11 ENCOUNTER — Encounter: Payer: Self-pay | Admitting: Internal Medicine

## 2019-03-13 ENCOUNTER — Encounter: Payer: Self-pay | Admitting: Internal Medicine

## 2019-03-13 DIAGNOSIS — H25811 Combined forms of age-related cataract, right eye: Secondary | ICD-10-CM | POA: Diagnosis not present

## 2019-03-13 DIAGNOSIS — H2511 Age-related nuclear cataract, right eye: Secondary | ICD-10-CM | POA: Diagnosis not present

## 2019-03-13 DIAGNOSIS — H25011 Cortical age-related cataract, right eye: Secondary | ICD-10-CM | POA: Diagnosis not present

## 2019-03-14 ENCOUNTER — Other Ambulatory Visit: Payer: Self-pay | Admitting: Radiology

## 2019-03-14 DIAGNOSIS — N6313 Unspecified lump in the right breast, lower outer quadrant: Secondary | ICD-10-CM | POA: Diagnosis not present

## 2019-03-14 DIAGNOSIS — C50511 Malignant neoplasm of lower-outer quadrant of right female breast: Secondary | ICD-10-CM | POA: Diagnosis not present

## 2019-03-14 DIAGNOSIS — Z170 Estrogen receptor positive status [ER+]: Secondary | ICD-10-CM | POA: Diagnosis not present

## 2019-03-14 LAB — HM MAMMOGRAPHY: HM Mammogram: ABNORMAL — AB (ref 0–4)

## 2019-03-15 ENCOUNTER — Telehealth: Payer: Self-pay | Admitting: Internal Medicine

## 2019-03-15 NOTE — Telephone Encounter (Signed)
mammo [facility] 02/22/19 abnormal and repeat 03/09/19 right breast mass 58mm (small) rec biopsy did she get this set up at [facility]?  Does she want to see a breast surgeon for this or will [facility] do this?    [place]

## 2019-03-16 NOTE — Telephone Encounter (Signed)
Patient states she had the biopsy done Wednesday 2/24 and that the report should be coming to you soon.

## 2019-03-19 ENCOUNTER — Encounter: Payer: Self-pay | Admitting: *Deleted

## 2019-03-19 DIAGNOSIS — Z170 Estrogen receptor positive status [ER+]: Secondary | ICD-10-CM

## 2019-03-19 DIAGNOSIS — C50511 Malignant neoplasm of lower-outer quadrant of right female breast: Secondary | ICD-10-CM | POA: Insufficient documentation

## 2019-03-19 NOTE — Progress Notes (Signed)
[facility]   [phone] [phone]   Clinic New Consult Note   Patient Care Team: McLean-Scocuzza, Tracy N, MD as PCP - General (Internal Medicine) Moody, John, MD as Consulting Physician (Radiation Oncology) , , MD as Consulting Physician (Hematology) Tsuei, Matthew, MD as Consulting Physician (General Surgery) Stuart, Dawn C, RN as Oncology Nurse Navigator Martini, Keisha N, RN as Oncology Nurse Navigator  Date of Service:  03/21/2019   CHIEF COMPLAINTS/PURPOSE OF CONSULTATION:  Newly Diagnosed Malignant neoplasm of lower-outer quadrant of right breast    Oncology History Overview Note  Cancer Staging Malignant neoplasm of lower-outer quadrant of right breast of female, estrogen receptor positive (HCC) Staging form: Breast, AJCC 8th Edition - Clinical stage from 03/14/2019: Stage IA (cT1a, cN0, cM0, G1, ER+, PR+, HER2-) - Signed by , , MD on 03/20/2019    Malignant neoplasm of lower-outer quadrant of right breast of female, estrogen receptor positive (HCC)  02/22/2019 Imaging   Bone Density Scan  02/22/19 DEXA shows osteopenia with lowest T-score -1.4 at left hip   03/09/2019 Mammogram   Diagnostic Mammogram 03/09/19 IMPRESSION the 5mm mass in 7:00 posiiton of right breast suspicous of malignancy.     03/14/2019 Cancer Staging   Staging form: Breast, AJCC 8th Edition - Clinical stage from 03/14/2019: Stage IA (cT1a, cN0, cM0, G1, ER+, PR+, HER2-) - Signed by , , MD on 03/20/2019   03/14/2019 Initial Biopsy   Diagnosis 03/14/19  Breast, right, needle core biopsy, 7 o'clock, 5cmfn - INVASIVE DUCTAL CARCINOMA. SEE NOTE Diagnosis Note Carcinoma measures 0.4 cm in greatest linear dimension and appears grade 1. Dr. Canacci reviewed the case and concurs with the diagnosis. A breast prognostic profile (ER, PR, Ki-67 and HER2) is pending and will be reported in an addendum. Dr. Bertrand was notified on 11/12/2019.   03/14/2019  Receptors her2   PROGNOSTIC INDICATORS Results: IMMUNOHISTOCHEMICAL AND MORPHOMETRIC ANALYSIS PERFORMED MANUALLY The tumor cells are EQUIVOCAL for Her2 (2+). Her2 by FISH will be performed and results reported separately. Estrogen Receptor: 95%, POSITIVE, STRONG STAINING INTENSITY Progesterone Receptor: 90%, POSITIVE, MODERATE STAINING INTENSITY Proliferation Marker Ki67: 10%   03/19/2019 Initial Diagnosis   Malignant neoplasm of lower-outer quadrant of right breast of female, estrogen receptor positive (HCC)      HISTORY OF PRESENTING ILLNESS:  Cheryl Knight 64 y.o. female is a here because of newly diagnosed right breast cancer. The patient presents to the Breast clinic today accompanied by her husband.  Her mammogram was found by mammogram. She notes she has had benign cysts seen on mammograms before. She denies feeling mass or having breast or body changes lately.   Socially she is married with 2 adult children. She works as a LPN twice a week. She smoked a few cigarettes for 7 years and quit in 2010. She drinks alcohol once a month.  They have a PMHx of osteoarthritis, RA and she is on Methotrexate twice a week. She also has hypothyroidism, high cholesterol. She notes her HTN is well controlled, stopped lisinopril 2 weeks ago due to cough. She is using albuterol inhaler for cough. She had total hysterectomy with BSO in her early 40s due to prolapsed uterus and cystic ovaries. She notes her menopause was manageable. She is on Topomax to control her HA's. She had tubal ligation, esophageal dilation. She has mild dysphagia of carbs. Her mother had breast cancer in her 80s. Her mother died from antiestrogen therapy due to blood clots. Her father had metastatic prostate   cancer in his 37s. She has not been using estrogen vaginal cream lately and can completely stop use. She has [place] for her HA's about once a month.    GYN HISTORY  Menarchal: 13 LMP: Hysterectomy + BSO in her early 36    Contraceptive: 11-14 years  HRT: hormonal cream, willing to stop G2P2    REVIEW OF SYSTEMS:    Constitutional: Denies fevers, chills or abnormal night sweats (+) Migraines  Eyes: Denies blurriness of vision, double vision or watery eyes Ears, nose, mouth, throat, and face: Denies mucositis or sore throat Respiratory: Denies cough, dyspnea or wheezes Cardiovascular: Denies palpitation, chest discomfort or lower extremity swelling Gastrointestinal:  Denies nausea, heartburn or change in bowel habits Skin: Denies abnormal skin rashes MSK: (+) osteoarthritis and RA Lymphatics: Denies new lymphadenopathy or easy bruising Neurological:Denies numbness, tingling or new weaknesses Behavioral/Psych: Mood is stable, no new changes  All other systems were reviewed with the patient and are negative.   MEDICAL HISTORY:  Past Medical History:  Diagnosis Date  . Cataracts, bilateral    tbd surgery 2021  . High cholesterol   . Hypertension   . Irritable bowel   . Osteoarthritis   . Osteoarthritis    i.e right knee   . Rheumatoid arthritis ([place])   . Thyroid disease     SURGICAL HISTORY: Past Surgical History:  Procedure Laterality Date  . ABDOMINAL HYSTERECTOMY     age 64 ovaries out  . ESOPHAGEAL DILATION    . TONSILLECTOMY    . TUBAL LIGATION      SOCIAL HISTORY: Social History   Socioeconomic History  . Marital status: Married    Spouse name: Not on file  . Number of children: 2  . Years of education: Not on file  . Highest education level: Not on file  Occupational History  . Occupation: nurse  Tobacco Use  . Smoking status: Former Smoker    Packs/day: 0.25    Years: 7.00    Pack years: 1.75    Types: Cigarettes    Quit date: 2000    Years since quitting: 21.1  . Smokeless tobacco: Never Used  Substance and Sexual Activity  . Alcohol use: Yes    Comment: socially   . Drug use: Never  . Sexual activity: Yes  Other Topics Concern  . Not on file  Social  History Narrative   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   Has a beach house    Social Determinants of Health   Financial Resource Strain:   . Difficulty of Paying Living Expenses: Not on file  Food Insecurity:   . Worried About Charity fundraiser in the Last Year: Not on file  . Ran Out of Food in the Last Year: Not on file  Transportation Needs:   . Lack of Transportation (Medical): Not on file  . Lack of Transportation (Non-Medical): Not on file  Physical Activity:   . Days of Exercise per Week: Not on file  . Minutes of Exercise per Session: Not on file  Stress:   . Feeling of Stress : Not on file  Social Connections:   . Frequency of Communication with Friends and Family: Not on file  . Frequency of Social Gatherings with Friends and Family: Not on file  . Attends Religious Services: Not on file  . Active Member of Clubs or Organizations: Not on file  . Attends Archivist Meetings: Not on file  . Marital Status: Not on file  Intimate Partner Violence:   . Fear of Current or Ex-Partner: Not on file  . Emotionally Abused: Not on file  . Physically Abused: Not on file  . Sexually Abused: Not on file    FAMILY HISTORY: Family History  Problem Relation Age of Onset  . Hypertension Mother   . Cancer Mother        breast dx age 29 died age 47   . Thyroid disease Mother   . Cancer Father 69       prostate cancer   . Prostate cancer Father   . Stroke Maternal Grandmother     ALLERGIES:  is allergic to meperidine hcl and pentazocine lactate.  MEDICATIONS:  Current Outpatient Medications  Medication Sig Dispense Refill  . albuterol (VENTOLIN HFA) 108 (90 Base) MCG/ACT inhaler Inhale 1-2 puffs into the lungs every 6 (six) hours as needed for wheezing or shortness of breath. 18 g 11  . betamethasone dipropionate (DIPROLENE) 0.05 % cream Apply topically 2 (two) times daily. 45 g 3  . Calcium Carbonate-Vitamin D (CALCIUM PLUS VITAMIN D PO) Take by mouth.    . cetirizine (ZYRTEC)  10 MG tablet Take 10 mg by mouth daily.    Marland Kitchen conjugated estrogens (PREMARIN) vaginal cream Place 1 Applicatorful vaginally daily. 42.5 g 12  . cyclobenzaprine (FLEXERIL) 10 MG tablet Take 1 tablet (10 mg total) by mouth daily. 90 tablet 1  . esomeprazole (NEXIUM) 40 MG capsule 1 by mouth twice a day 200 capsule 4  . fish oil-omega-3 fatty acids 1000 MG capsule Take 1 g by mouth daily.    . fluticasone (FLONASE) 50 MCG/ACT nasal spray Place 2 sprays into both nostrils daily. 16 g 11  . folic acid (FOLVITE) 1 MG tablet Take 2 tablets (2 mg total) by mouth daily. 180 tablet 3  . glucosamine-chondroitin 500-400 MG tablet Take 1 tablet by mouth 3 (three) times daily.    Marland Kitchen HYDROcodone-acetaminophen (NORCO) 7.5-325 MG tablet Take 1 tab by mouth twice daily as needed for migraine 60 tablet 0  . levothyroxine (SYNTHROID) 100 MCG tablet Daily 30 minutes before food 90 tablet 3  . Methotrexate Sodium (METHOTREXATE, PF,) 50 MG/2ML injection INJECT 0.8ML INTO THE SKIN ONCE A WEEK 10 mL 0  . olmesartan-hydrochlorothiazide (BENICAR HCT) 20-12.5 MG tablet Take 1 tablet by mouth daily. In am 90 tablet 3  . pilocarpine (SALAGEN) 5 MG tablet Take 1 tablet (5 mg total) by mouth 3 (three) times daily. 270 tablet 0  . pravastatin (PRAVACHOL) 20 MG tablet Take 1 tablet (20 mg total) by mouth every Monday, Wednesday, and Friday. (Patient taking differently: Take 20 mg by mouth every Monday, Wednesday, and Friday. 3 times a week) 90 tablet 3  . predniSONE (DELTASONE) 5 MG tablet Take 4 tablets by mouth daily for 2 days, 3 tablets by mouth for 2 days, 2 tablets by mouth for 2 days, 1 tablet by mouth for 2 days. 20 tablet 0  . promethazine (PHENERGAN) 25 MG tablet Take 1 tablet (25 mg total) by mouth every 6 (six) hours as needed. 30 tablet 2  . RESTASIS 0.05 % ophthalmic emulsion     . SUMAtriptan (IMITREX) 50 MG tablet TAKE 1 TABLET BY MOUTH AS NEEDED FOR HEADACHE 9 tablet 11  . topiramate (TOPAMAX) 100 MG tablet Take 1  tablet (100 mg total) by mouth daily. 90 tablet 3  . traMADol (ULTRAM) 50 MG tablet Take 2 tablets by mouth twice daily as needed for pain relief. 120 tablet  0   No current facility-administered medications for this visit.    PHYSICAL EXAMINATION: ECOG PERFORMANCE STATUS: 0 - Asymptomatic  Vitals:   03/21/19 1243  BP: (!) 123/94  Pulse: 87  Resp: 18  Temp: 98.9 F (37.2 C)  SpO2: 97%   Filed Weights   03/21/19 1243  Weight: 216 lb 8 oz (98.2 kg)    GENERAL:alert, no distress and comfortable SKIN: skin color, texture, turgor are normal, no rashes or significant lesions EYES: normal, Conjunctiva are pink and non-injected, sclera clear  NECK: supple, thyroid normal size, non-tender, without nodularity LYMPH:  no palpable lymphadenopathy in the cervical, axillary  LUNGS: clear to auscultation and percussion with normal breathing effort HEART: regular rate & rhythm and no murmurs and no lower extremity edema ABDOMEN:abdomen soft, non-tender and normal bowel sounds Musculoskeletal:no cyanosis of digits and no clubbing  NEURO: alert & oriented x 3 with fluent speech, no focal motor/sensory deficits BREAST: No palpable mass, nodules or adenopathy bilaterally. Breast exam benign.  LABORATORY DATA:  I have reviewed the data as listed CBC Latest Ref Rng & Units 03/21/2019 12/21/2018 09/13/2018  WBC 4.0 - 10.5 K/uL 6.6 7.0 7.2  Hemoglobin 12.0 - 15.0 g/dL 14.4 14.3 13.7  Hematocrit 36.0 - 46.0 % 44.0 42.3 41.1  Platelets 150 - 400 K/uL 303 325 332    CMP Latest Ref Rng & Units 03/21/2019 12/21/2018 09/13/2018  Glucose 70 - 99 mg/dL 168(H) 142(H) 145(H)  BUN 8 - 23 mg/dL 13 17 16  Creatinine 0.44 - 1.00 mg/dL 0.78 0.66 0.67  Sodium 135 - 145 mmol/L 143 144 142  Potassium 3.5 - 5.1 mmol/L 3.4(L) 3.8 3.9  Chloride 98 - 111 mmol/L 108 108(H) 106  CO2 22 - 32 mmol/L 26 23 26  Calcium 8.9 - 10.3 mg/dL 9.5 10.0 10.2  Total Protein 6.5 - 8.1 g/dL 7.4 6.9 6.7  Total Bilirubin 0.3 - 1.2 mg/dL  0.3 0.3 0.2  Alkaline Phos 38 - 126 U/L 61 68 -  AST 15 - 41 U/L 19 15 15  ALT 0 - 44 U/L 12 10 9      RADIOGRAPHIC STUDIES: I have personally reviewed the radiological images as listed and agreed with the findings in the report. No results found.  ASSESSMENT & PLAN:  Cheryl Knight is a 63 y.o. Caucasian female with a history of B/l cataracts, HTN, Osteoarthritis, Rheumatoid arthritis.  1 Malignant neoplasm of lower-outer quadrant of right breast, Stage IA, c(T1aN0M0), ER/PR+, HER2-, Grade I -We discussed her image findings and the biopsy results in great details. She has a 5mm mass in right breast with invasive ductal carcinoma.  -Given the early disease, she likely need a lumpectomy with SLNB. She is agreeable with that. She was seen by Dr. Tsuei today and likely will proceed with surgery soon.  -Giving the small low grade tumor, I do not anticipate she will need adjuvant chemotherapy. But if her surgical tumor is found to be larger than 1cm or with >0.5cm with G2-3, I would recommend a Oncotype Dx test on the surgical sample and we'll make a decision about adjuvant chemotherapy based on the Oncotype result.    -The risk of recurrence depends on the stage and biology of the tumor. She is early stage, with ER/PR positive and HER2 negative markers. I discussed this is the more common type of slow growing tumor.  -She was also seen by radiation oncologist Dr. Moody today. If her surgical sentinel lymph nodes were negative, she   would not need post mastectomy radiation. Otherwise radiation is recommended to reduce the risk for local recurrence.  -Given the strong ER and PR expression in her postmenopausal status, I recommend adjuvant endocrine therapy with aromatase inhibitor for a total of 5-10 years to reduce the risk of cancer recurrence. Potential benefits and side effects were discussed with patient and she is interested. -She has been on hormonal vaginal cream but has not used it  lately. I recommend she d/c. She is agreeable.  -We also discussed the breast cancer surveillance after her surgery. She will continue annual screening mammogram, self exam, and a routine office visit with lab and exam with us. -I encouraged her to have healthy diet and exercise regularly -Labs reviewed, CBC and CMP except K 3.4 and BG 168. Physical Exam unremarkable.     2. Genetic testing -She had mother with late age breast cancer and father with prostate cancer in his 50s.  -I will check to see if she is eligible for genetic testing. She is interested if so.    3. Comorbidities: Osteoarthritis, Rheumatoid arthritis, Migraines, high cholesterol, hypothyroidism, HTN, GERD -She is on Methotrexate twice a week for her arthritis which controls RA very well.  -Continue medication for migraines. She may use Norco once a month.  -Continue HTN, cholesterol and thyroid medication and f/u with PCP. All well controlled -She does still have cough from lisinopril which was recently stopped. She uses inhaler as needed.  -Her BG is 168 today (03/21/19). I recommend she control her diet with less carbs and increase exercise.  -She has h/o of Esophogeal issues and required dilation. She still has mild dysphagia with carbs.   PLAN: -Proceed with surgery soon, lumpectomy and sentinel lymph node biopsy -Oncotype if tumor >1cm or >5mm with Grade 2-3 disease  -F/u at end of Radiation, or sooner if needed    No orders of the defined types were placed in this encounter.   All questions were answered. The patient knows to call the clinic with any problems, questions or concerns. The total time spent in the appointment was 50 minutes.      , MD 03/21/2019 5:48 PM  I, Amoya Bennett, am acting as scribe for  , MD.   I have reviewed the above documentation for accuracy and completeness, and I agree with the above.     

## 2019-03-21 ENCOUNTER — Encounter: Payer: Self-pay | Admitting: Licensed Clinical Social Worker

## 2019-03-21 ENCOUNTER — Inpatient Hospital Stay: Payer: PPO | Attending: Hematology | Admitting: Hematology

## 2019-03-21 ENCOUNTER — Inpatient Hospital Stay: Payer: PPO

## 2019-03-21 ENCOUNTER — Ambulatory Visit: Payer: PPO | Attending: Surgery | Admitting: Physical Therapy

## 2019-03-21 ENCOUNTER — Other Ambulatory Visit: Payer: Self-pay

## 2019-03-21 ENCOUNTER — Encounter: Payer: Self-pay | Admitting: Physical Therapy

## 2019-03-21 ENCOUNTER — Ambulatory Visit: Payer: Self-pay | Admitting: Surgery

## 2019-03-21 ENCOUNTER — Ambulatory Visit
Admission: RE | Admit: 2019-03-21 | Discharge: 2019-03-21 | Disposition: A | Discharge status: Home / Self Care | Payer: PPO | Source: Ambulatory Visit | Attending: Radiation Oncology | Admitting: Radiation Oncology

## 2019-03-21 ENCOUNTER — Encounter: Payer: Self-pay | Admitting: Hematology

## 2019-03-21 VITALS — BP 123/94 | HR 87 | Temp 98.9°F | Resp 18 | Ht 68.0 in | Wt 216.5 lb

## 2019-03-21 DIAGNOSIS — K219 Gastro-esophageal reflux disease without esophagitis: Secondary | ICD-10-CM | POA: Diagnosis not present

## 2019-03-21 DIAGNOSIS — E039 Hypothyroidism, unspecified: Secondary | ICD-10-CM | POA: Insufficient documentation

## 2019-03-21 DIAGNOSIS — R293 Abnormal posture: Secondary | ICD-10-CM | POA: Insufficient documentation

## 2019-03-21 DIAGNOSIS — I10 Essential (primary) hypertension: Secondary | ICD-10-CM | POA: Insufficient documentation

## 2019-03-21 DIAGNOSIS — M069 Rheumatoid arthritis, unspecified: Secondary | ICD-10-CM | POA: Diagnosis not present

## 2019-03-21 DIAGNOSIS — Z79899 Other long term (current) drug therapy: Secondary | ICD-10-CM | POA: Diagnosis not present

## 2019-03-21 DIAGNOSIS — E7800 Pure hypercholesterolemia, unspecified: Secondary | ICD-10-CM | POA: Diagnosis not present

## 2019-03-21 DIAGNOSIS — G43909 Migraine, unspecified, not intractable, without status migrainosus: Secondary | ICD-10-CM | POA: Diagnosis not present

## 2019-03-21 DIAGNOSIS — Z170 Estrogen receptor positive status [ER+]: Secondary | ICD-10-CM

## 2019-03-21 DIAGNOSIS — R1310 Dysphagia, unspecified: Secondary | ICD-10-CM | POA: Insufficient documentation

## 2019-03-21 DIAGNOSIS — C50511 Malignant neoplasm of lower-outer quadrant of right female breast: Secondary | ICD-10-CM | POA: Diagnosis not present

## 2019-03-21 DIAGNOSIS — C50911 Malignant neoplasm of unspecified site of right female breast: Secondary | ICD-10-CM | POA: Diagnosis not present

## 2019-03-21 LAB — CBC WITH DIFFERENTIAL (CANCER CENTER ONLY)
Abs Immature Granulocytes: 0.02 10*3/uL (ref 0.00–0.07)
Basophils Absolute: 0.1 10*3/uL (ref 0.0–0.1)
Basophils Relative: 1 %
Eosinophils Absolute: 0.2 10*3/uL (ref 0.0–0.5)
Eosinophils Relative: 3 %
HCT: 44 % (ref 36.0–46.0)
Hemoglobin: 14.4 g/dL (ref 12.0–15.0)
Immature Granulocytes: 0 %
Lymphocytes Relative: 49 %
Lymphs Abs: 3.2 10*3/uL (ref 0.7–4.0)
MCH: 31.4 pg (ref 26.0–34.0)
MCHC: 32.7 g/dL (ref 30.0–36.0)
MCV: 96.1 fL (ref 80.0–100.0)
Monocytes Absolute: 0.5 10*3/uL (ref 0.1–1.0)
Monocytes Relative: 8 %
Neutro Abs: 2.6 10*3/uL (ref 1.7–7.7)
Neutrophils Relative %: 39 %
Platelet Count: 303 10*3/uL (ref 150–400)
RBC: 4.58 MIL/uL (ref 3.87–5.11)
RDW: 13.7 % (ref 11.5–15.5)
WBC Count: 6.6 10*3/uL (ref 4.0–10.5)
nRBC: 0 % (ref 0.0–0.2)

## 2019-03-21 LAB — CMP (CANCER CENTER ONLY)
ALT: 12 U/L (ref 0–44)
AST: 19 U/L (ref 15–41)
Albumin: 4.1 g/dL (ref 3.5–5.0)
Alkaline Phosphatase: 61 U/L (ref 38–126)
Anion gap: 9 (ref 5–15)
BUN: 13 mg/dL (ref 8–23)
CO2: 26 mmol/L (ref 22–32)
Calcium: 9.5 mg/dL (ref 8.9–10.3)
Chloride: 108 mmol/L (ref 98–111)
Creatinine: 0.78 mg/dL (ref 0.44–1.00)
GFR, Est AFR Am: >60 mL/min (ref >60)
GFR, Estimated: >60 mL/min (ref >60)
Glucose, Bld: 168 mg/dL — ABNORMAL HIGH (ref 70–99)
Potassium: 3.4 mmol/L — ABNORMAL LOW (ref 3.5–5.1)
Sodium: 143 mmol/L (ref 135–145)
Total Bilirubin: 0.3 mg/dL (ref 0.3–1.2)
Total Protein: 7.4 g/dL (ref 6.5–8.1)

## 2019-03-21 LAB — GENETIC SCREENING ORDER

## 2019-03-21 NOTE — H&P (Signed)
History of Present Illness Cheryl Knight. Cheryl Laroque MD; 03/21/2019 1:47 PM) The patient is a 64 year old female who presents with breast cancer. [place] PCP - Cheryl Knight Oncology - Cheryl Knight Rad - [place] - Cheryl Knight  This is a 64 year old female who presents after recent routine screening mammogram detected an abnormality in the right breast. She underwent further work-up including biopsy that revealed breast cancer. Mammogram estimates the size at 5 mm. The patient has had no previous breast problems except fibrocystic changes and previous diagnostic mammograms. no previous biopsies. Her mother had breast cancer at age 24.  The patient has rheumatoid arthritis and uses weekly Methotrexate injections. She uses Premarin cream  Mammo 03/09/19 - 5 mm mass right breast 0700 anterior depth  Biopsy 03/14/19 - Invasive ductal carcinoma - Grade 1, ER/PR positive, Ki67 10%, Her 2 negative   PMH: Hyperlipidemia, RA, hypothyroidism, GERD PSH: Previous tracheostomy, Esophageal dilatation, total hysterectomy, cataracts, SCC excision chest     Past Surgical History Cheryl Pummel, RN; 03/21/2019 7:17 AM) Breast Biopsy Right. Cataract Surgery Bilateral. Hysterectomy (not due to cancer) - Complete Tonsillectomy  Diagnostic Studies History Cheryl Pummel, RN; 03/21/2019 7:17 AM) Colonoscopy 5-10 years ago Mammogram within last year Pap Smear >5 years ago  Allergies Cheryl Key K. Ineta Sinning, MD; 03/20/2019 8:54 PM) Meperidine HCl *ANALGESICS - OPIOID* Vomiting. Pentazocine-Acetaminophen *ANALGESICS - OPIOID* Hallcinations  Medication History Cheryl Pummel, RN; 03/21/2019 7:17 AM) Ventolin HFA (108 (90 Base)MCG/ACT Aerosol Soln, Inhalation) Active. Diprolene (0.05% Cream, External) Active. Calcium Plus Vitamin D (500-5MG-MCG Tablet, Oral) Active. ZyrTEC Allergy (10MG Tablet, Oral) Active. Premarin (0.625MG/GM Cream, Vaginal) Active. Flexeril (10MG Tablet, Oral) Active. NexIUM (40MG  Capsule DR, Oral) Active. Omega 3 (1000MG Capsule, Oral) Active. Flonase (50MCG/DOSE Inhaler, Nasal) Active. Folic Acid (1MG Tablet, Oral) Active. Synthroid (100MCG Tablet, Oral) Active. Methotrexate Sodium (50MG/2ML Solution, Injection) Active. Benicar HCT (20-12.5MG Tablet, Oral) Active. Salagen (5MG Tablet, Oral) Active. Pravachol (20MG Tablet, Oral) Active. Promethazine HCl (25MG Tablet, Oral) Active. Restasis MultiDose (0.05% Emulsion, Ophthalmic) Active. Imitrex (50MG Tablet, Oral) Active. Topamax (100MG Tablet, Oral) Active. traMADol HCl (50MG Tablet, Oral) Active. Medications Reconciled  Social History Cheryl Pummel, RN; 03/21/2019 7:17 AM) Alcohol use Occasional alcohol use. Caffeine use Coffee. No drug use Tobacco use Former smoker.  Family History Cheryl Pummel, RN; 03/21/2019 7:17 AM) Alcohol Abuse Father. Breast Cancer Mother. Diabetes Mellitus Mother. Hypertension Mother. Ischemic Bowel Disease Mother, Sister. Prostate Cancer Father.  Pregnancy / Birth History Cheryl Pummel, RN; 03/21/2019 7:17 AM) Age at menarche 75 years. Gravida 2 Maternal age 65-25 Para 2  Other Problems Cheryl Pummel, RN; 03/21/2019 7:17 AM) Arthritis Cancer Gastroesophageal Reflux Disease General anesthesia - complications High blood pressure Hypercholesterolemia Lump In Breast Migraine Headache Oophorectomy Bilateral. Thyroid Disease     Review of Systems Cheryl Spillers Ledford RN; 03/21/2019 7:17 AM) General Not Present- Appetite Loss, Chills, Fatigue, Fever, Night Sweats, Weight Gain and Weight Loss. Skin Not Present- Change in Wart/Mole, Dryness, Hives, Jaundice, New Lesions, Non-Healing Wounds, Rash and Ulcer. HEENT Present- Seasonal Allergies. Not Present- Earache, Hearing Loss, Hoarseness, Nose Bleed, Oral Ulcers, Ringing in the Ears, Sinus Pain, Sore Throat, Visual Disturbances, Wears glasses/contact lenses and Yellow Eyes. Respiratory  Present- Chronic Cough. Not Present- Bloody sputum, Difficulty Breathing, Snoring and Wheezing. Breast Not Present- Breast Mass, Breast Pain, Nipple Discharge and Skin Changes. Cardiovascular Not Present- Chest Pain, Difficulty Breathing Lying Down, Leg Cramps, Palpitations, Rapid Heart Rate, Shortness of Breath and Swelling of Extremities. Gastrointestinal Present- Difficulty Swallowing and Indigestion. Not Present- Abdominal  Pain, Bloating, Bloody Stool, Change in Bowel Habits, Chronic diarrhea, Constipation, Excessive gas, Gets full quickly at meals, Hemorrhoids, Nausea, Rectal Pain and Vomiting. Female Genitourinary Not Present- Frequency, Nocturia, Painful Urination, Pelvic Pain and Urgency. Musculoskeletal Present- Joint Pain, Joint Stiffness and Swelling of Extremities. Not Present- Back Pain, Muscle Pain and Muscle Weakness. Neurological Present- Headaches. Not Present- Decreased Memory, Fainting, Numbness, Seizures, Tingling, Tremor, Trouble walking and Weakness. Psychiatric Not Present- Anxiety, Bipolar, Change in Sleep Pattern, Depression, Fearful and Frequent crying. Endocrine Not Present- Cold Intolerance, Excessive Hunger, Hair Changes, Heat Intolerance, Hot flashes and New Diabetes. Hematology Not Present- Blood Thinners, Easy Bruising, Excessive bleeding, Gland problems, HIV and Persistent Infections.   Physical Exam Cheryl Key K. Eliyana Pagliaro MD; 03/21/2019 1:48 PM)  The physical exam findings are as follows: Note:Constitutional: WDWN in NAD, conversant, no obvious deformities; lying in bed comfortably Eyes: Pupils equal, round; sclera anicteric; moist conjunctiva; no lid lag HENT: Oral mucosa moist; good dentition Neck: No masses palpated, trachea midline; no thyromegaly Breasts: symmetric, no nipple retraction or discharge; bilateral fibrocystic changes, no dominant palpable masses; healed biopsy site in RLOQ Lungs: CTA bilaterally; normal respiratory effort CV: Regular rate and rhythm;  no murmurs; extremities well-perfused with no edema Abd: +bowel sounds, soft, non-tender, no palpable organomegaly; no palpable hernias Musc: Unable to assess gait; no apparent clubbing or cyanosis in extremities Lymphatic: No palpable cervical or axillary lymphadenopathy Skin: Warm, dry; no sign of jaundice Psychiatric - alert and oriented x 4; calm mood and affect    Assessment & Plan Cheryl Key K. Rudell Ortman MD; 03/21/2019 1:53 PM)  INVASIVE DUCTAL CARCINOMA OF RIGHT BREAST, STAGE 1 (C50.911) Impression: RLOQ 5 mm 5 cmfn IDC, ER/PR +, Her2 -  Current Plans Schedule for Surgery - Right radioactive seed localized lumpectomy with sentinel lymph node biopsy. The surgical procedure has been discussed with the patient. Potential risks, benefits, alternative treatments, and expected outcomes have been explained. All of the patient's questions at this time have been answered. The likelihood of reaching the patient's treatment goal is good. The patient understand the proposed surgical procedure and wishes to proceed. Note:MDC - The patient will receive adjuvant radiation and probable anti-estrogens.   Cheryl Knight. Cheryl Dover, MD, [facility]  General/ Trauma Surgery   03/21/2019 1:53 PM

## 2019-03-21 NOTE — H&P (View-Only) (Signed)
History of Present Illness Cheryl Knight. Cheryl Farrier MD; 03/21/2019 1:47 PM) The patient is a 64 year old female who presents with breast cancer. [place] PCP - [facility] - [facility] [place] - Isaiah Blakes  This is a 64 year old female who presents after recent routine screening mammogram detected an abnormality in the right breast. She underwent further work-up including biopsy that revealed breast cancer. Mammogram estimates the size at 5 mm. The patient has had no previous breast problems except fibrocystic changes and previous diagnostic mammograms. no previous biopsies. Her mother had breast cancer at age 82.  The patient has rheumatoid arthritis and uses weekly Methotrexate injections. She uses Premarin cream  Mammo 03/09/19 - 5 mm mass right breast 0700 anterior depth  Biopsy 03/14/19 - Invasive ductal carcinoma - Grade 1, ER/PR positive, Ki67 10%, Her 2 negative   PMH: Hyperlipidemia, RA, hypothyroidism, GERD PSH: Previous tracheostomy, Esophageal dilatation, total hysterectomy, cataracts, SCC excision chest     Past Surgical History Cheryl Pummel, RN; 03/21/2019 7:17 AM) Breast Biopsy Right. Cataract Surgery Bilateral. Hysterectomy (not due to cancer) - Complete Tonsillectomy  Diagnostic Studies History Cheryl Pummel, RN; 03/21/2019 7:17 AM) Colonoscopy 5-10 years ago Mammogram within last year Pap Smear >5 years ago  Allergies Cheryl Key K. Kaushal Vannice, MD; 03/20/2019 8:54 PM) Meperidine HCl *ANALGESICS - OPIOID* Vomiting. Pentazocine-Acetaminophen *ANALGESICS - OPIOID* Hallcinations  Medication History Cheryl Pummel, RN; 03/21/2019 7:17 AM) Ventolin HFA (108 (90 Base)MCG/ACT Aerosol Soln, Inhalation) Active. Diprolene (0.05% Cream, External) Active. Calcium Plus Vitamin D (500-5MG-MCG Tablet, Oral) Active. ZyrTEC Allergy (10MG Tablet, Oral) Active. Premarin (0.625MG/GM Cream, Vaginal) Active. Flexeril (10MG Tablet, Oral) Active. NexIUM (40MG  Capsule DR, Oral) Active. Omega 3 (1000MG Capsule, Oral) Active. Flonase (50MCG/DOSE Inhaler, Nasal) Active. Folic Acid (1MG Tablet, Oral) Active. Synthroid (100MCG Tablet, Oral) Active. Methotrexate Sodium (50MG/2ML Solution, Injection) Active. Benicar HCT (20-12.5MG Tablet, Oral) Active. Salagen (5MG Tablet, Oral) Active. Pravachol (20MG Tablet, Oral) Active. Promethazine HCl (25MG Tablet, Oral) Active. Restasis MultiDose (0.05% Emulsion, Ophthalmic) Active. Imitrex (50MG Tablet, Oral) Active. Topamax (100MG Tablet, Oral) Active. traMADol HCl (50MG Tablet, Oral) Active. Medications Reconciled  Social History Cheryl Pummel, RN; 03/21/2019 7:17 AM) Alcohol use Occasional alcohol use. Caffeine use Coffee. No drug use Tobacco use Former smoker.  Family History Cheryl Pummel, RN; 03/21/2019 7:17 AM) Alcohol Abuse Father. Breast Cancer Mother. Diabetes Mellitus Mother. Hypertension Mother. Ischemic Bowel Disease Mother, Sister. Prostate Cancer Father.  Pregnancy / Birth History Cheryl Pummel, RN; 03/21/2019 7:17 AM) Age at menarche 29 years. Gravida 2 Maternal age 36-25 Para 2  Other Problems Cheryl Pummel, RN; 03/21/2019 7:17 AM) Arthritis Cancer Gastroesophageal Reflux Disease General anesthesia - complications High blood pressure Hypercholesterolemia Lump In Breast Migraine Headache Oophorectomy Bilateral. Thyroid Disease     Review of Systems Cheryl Spillers Ledford RN; 03/21/2019 7:17 AM) General Not Present- Appetite Loss, Chills, Fatigue, Fever, Night Sweats, Weight Gain and Weight Loss. Skin Not Present- Change in Wart/Mole, Dryness, Hives, Jaundice, New Lesions, Non-Healing Wounds, Rash and Ulcer. HEENT Present- Seasonal Allergies. Not Present- Earache, Hearing Loss, Hoarseness, Nose Bleed, Oral Ulcers, Ringing in the Ears, Sinus Pain, Sore Throat, Visual Disturbances, Wears glasses/contact lenses and Yellow Eyes. Respiratory  Present- Chronic Cough. Not Present- Bloody sputum, Difficulty Breathing, Snoring and Wheezing. Breast Not Present- Breast Mass, Breast Pain, Nipple Discharge and Skin Changes. Cardiovascular Not Present- Chest Pain, Difficulty Breathing Lying Down, Leg Cramps, Palpitations, Rapid Heart Rate, Shortness of Breath and Swelling of Extremities. Gastrointestinal Present- Difficulty Swallowing and Indigestion. Not Present- Abdominal  Pain, Bloating, Bloody Stool, Change in Bowel Habits, Chronic diarrhea, Constipation, Excessive gas, Gets full quickly at meals, Hemorrhoids, Nausea, Rectal Pain and Vomiting. Female Genitourinary Not Present- Frequency, Nocturia, Painful Urination, Pelvic Pain and Urgency. Musculoskeletal Present- Joint Pain, Joint Stiffness and Swelling of Extremities. Not Present- Back Pain, Muscle Pain and Muscle Weakness. Neurological Present- Headaches. Not Present- Decreased Memory, Fainting, Numbness, Seizures, Tingling, Tremor, Trouble walking and Weakness. Psychiatric Not Present- Anxiety, Bipolar, Change in Sleep Pattern, Depression, Fearful and Frequent crying. Endocrine Not Present- Cold Intolerance, Excessive Hunger, Hair Changes, Heat Intolerance, Hot flashes and New Diabetes. Hematology Not Present- Blood Thinners, Easy Bruising, Excessive bleeding, Gland problems, HIV and Persistent Infections.   Physical Exam Cheryl Key K. Cheryl Kops MD; 03/21/2019 1:48 PM)  The physical exam findings are as follows: Note:Constitutional: WDWN in NAD, conversant, no obvious deformities; lying in bed comfortably Eyes: Pupils equal, round; sclera anicteric; moist conjunctiva; no lid lag HENT: Oral mucosa moist; good dentition Neck: No masses palpated, trachea midline; no thyromegaly Breasts: symmetric, no nipple retraction or discharge; bilateral fibrocystic changes, no dominant palpable masses; healed biopsy site in RLOQ Lungs: CTA bilaterally; normal respiratory effort CV: Regular rate and rhythm;  no murmurs; extremities well-perfused with no edema Abd: +bowel sounds, soft, non-tender, no palpable organomegaly; no palpable hernias Musc: Unable to assess gait; no apparent clubbing or cyanosis in extremities Lymphatic: No palpable cervical or axillary lymphadenopathy Skin: Warm, dry; no sign of jaundice Psychiatric - alert and oriented x 4; calm mood and affect    Assessment & Plan Cheryl Key K. Felipa Laroche MD; 03/21/2019 1:53 PM)  INVASIVE DUCTAL CARCINOMA OF RIGHT BREAST, STAGE 1 (C50.911) Impression: RLOQ 5 mm 5 cmfn IDC, ER/PR +, Her2 -  Current Plans Schedule for Surgery - Right radioactive seed localized lumpectomy with sentinel lymph node biopsy. The surgical procedure has been discussed with the patient. Potential risks, benefits, alternative treatments, and expected outcomes have been explained. All of the patient's questions at this time have been answered. The likelihood of reaching the patient's treatment goal is good. The patient understand the proposed surgical procedure and wishes to proceed. Note:MDC - The patient will receive adjuvant radiation and probable anti-estrogens.   Cheryl Knight. Georgette Dover, MD, [facility]  General/ Trauma Surgery   03/21/2019 1:53 PM

## 2019-03-21 NOTE — Patient Instructions (Signed)

## 2019-03-21 NOTE — Progress Notes (Signed)
Radiation Oncology         ([phone]) [phone] ________________________________  Name: Cheryl Knight        MRN: [id number]  Date of Service: 03/21/2019 DOB: 11/22/1955  CC:McLean-Scocuzza, Nino Glow, MD  Donnie Mesa, MD     REFERRING PHYSICIAN: Donnie Mesa, MD   DIAGNOSIS: The encounter diagnosis was Malignant neoplasm of lower-outer quadrant of right breast of female, estrogen receptor positive ([place]).   HISTORY OF PRESENT ILLNESS: Cheryl Knight is a 64 y.o. female seen in the multidisciplinary breast clinic for a new diagnosis of right breast cancer. The patient was noted to have a screening detected mass in the right breast.  Diagnostic imaging measured this at 7 o'clock position measuring 5 mm, her axilla was negative for adenopathy.  She underwent a biopsy on 03/14/2019 that revealed a grade 1 invasive ductal carcinoma, her tumor was ER/PR positive, HER-2 negative with a Ki-67 of 10%.  She comes today to discuss treatment recommendations of her cancer.   PREVIOUS RADIATION THERAPY: No   PAST MEDICAL HISTORY:  Past Medical History:  Diagnosis Date  . Cataracts, bilateral    tbd surgery 2021  . High cholesterol   . Hypertension   . Irritable bowel   . Osteoarthritis   . Osteoarthritis    i.e right knee   . Rheumatoid arthritis ([place])   . Thyroid disease        PAST SURGICAL HISTORY: Past Surgical History:  Procedure Laterality Date  . ABDOMINAL HYSTERECTOMY     age 62 ovaries out  . ESOPHAGEAL DILATION    . TONSILLECTOMY    . TUBAL LIGATION       FAMILY HISTORY:  Family History  Problem Relation Age of Onset  . Hypertension Mother   . Cancer Mother        breast dx age 73 died age 63   . Thyroid disease Mother   . Cancer Father   . Stroke Maternal Grandmother      SOCIAL HISTORY:  reports that she has quit smoking. Her smoking use included cigarettes. She has never used smokeless tobacco. She reports current alcohol use. She reports that she does not  use drugs.  The patient is married and lives in [place]. She enjoys fishing and hunting. 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 Her husband is an EMT.    ALLERGIES: Meperidine hcl and Pentazocine lactate   MEDICATIONS:  Current Outpatient Medications  Medication Sig Dispense Refill  . albuterol (VENTOLIN HFA) 108 (90 Base) MCG/ACT inhaler Inhale 1-2 puffs into the lungs every 6 (six) hours as needed for wheezing or shortness of breath. 18 g 11  . betamethasone dipropionate (DIPROLENE) 0.05 % cream Apply topically 2 (two) times daily. 45 g 3  . Calcium Carbonate-Vitamin D (CALCIUM PLUS VITAMIN D PO) Take by mouth.    . cetirizine (ZYRTEC) 10 MG tablet Take 10 mg by mouth daily.    Marland Kitchen conjugated estrogens (PREMARIN) vaginal cream Place 1 Applicatorful vaginally daily. 42.5 g 12  . cyclobenzaprine (FLEXERIL) 10 MG tablet Take 1 tablet (10 mg total) by mouth daily. 90 tablet 1  . esomeprazole (NEXIUM) 40 MG capsule 1 by mouth twice a day 200 capsule 4  . fish oil-omega-3 fatty acids 1000 MG capsule Take 1 g by mouth daily.    . fluticasone (FLONASE) 50 MCG/ACT nasal spray Place 2 sprays into both nostrils daily. 16 g 11  . folic acid (FOLVITE)  1 MG tablet Take 2 tablets (2 mg total) by mouth daily. 180 tablet 3  . glucosamine-chondroitin 500-400 MG tablet Take 1 tablet by mouth 3 (three) times daily.    Marland Kitchen HYDROcodone-acetaminophen (NORCO) 7.5-325 MG tablet Take 1 tab by mouth twice daily as needed for migraine 60 tablet 0  . levothyroxine (SYNTHROID) 100 MCG tablet Daily 30 minutes before food 90 tablet 3  . Methotrexate Sodium (METHOTREXATE, PF,) 50 MG/2ML injection INJECT 0.8ML INTO THE SKIN ONCE A WEEK 10 mL 0  . olmesartan-hydrochlorothiazide (BENICAR HCT) 20-12.5 MG tablet Take 1 tablet by mouth daily. In am 90 tablet 3  . pilocarpine (SALAGEN) 5 MG tablet Take 1 tablet (5 mg total) by mouth 3 (three) times daily. 270 tablet 0  .  pravastatin (PRAVACHOL) 20 MG tablet Take 1 tablet (20 mg total) by mouth every Monday, Wednesday, and Friday. 90 tablet 3  . predniSONE (DELTASONE) 5 MG tablet Take 4 tablets by mouth daily for 2 days, 3 tablets by mouth for 2 days, 2 tablets by mouth for 2 days, 1 tablet by mouth for 2 days. 20 tablet 0  . promethazine (PHENERGAN) 25 MG tablet Take 1 tablet (25 mg total) by mouth every 6 (six) hours as needed. 30 tablet 2  . RESTASIS 0.05 % ophthalmic emulsion     . SUMAtriptan (IMITREX) 50 MG tablet TAKE 1 TABLET BY MOUTH AS NEEDED FOR HEADACHE 9 tablet 11  . topiramate (TOPAMAX) 100 MG tablet Take 1 tablet (100 mg total) by mouth daily. 90 tablet 3  . traMADol (ULTRAM) 50 MG tablet Take 2 tablets by mouth twice daily as needed for pain relief. 120 tablet 0   No current facility-administered medications for this visit.     REVIEW OF SYSTEMS: On review of systems, the patient reports that she is doing well overall. She denies any chest pain, shortness of breath, cough, fevers, chills, night sweats, unintended weight changes. She denies any bowel or bladder disturbances, and denies abdominal pain, nausea or vomiting. She denies any new musculoskeletal or joint aches or pains. A complete review of systems is obtained and is otherwise negative.     PHYSICAL EXAM:  Wt Readings from Last 3 Encounters:  02/08/19 215 lb (97.5 kg)  11/01/18 213 lb 12.8 oz (97 kg)  02/14/18 221 lb (100.2 kg)   Temp Readings from Last 3 Encounters:  02/14/18 98 F (36.7 C) (Oral)  11/15/17 98.3 F (36.8 C) (Oral)  07/18/17 98.6 F (37 C) (Oral)   BP Readings from Last 3 Encounters:  02/08/19 128/84  11/01/18 (!) 149/79  02/14/18 110/78   Pulse Readings from Last 3 Encounters:  11/01/18 (!) 107  02/14/18 89  12/13/17 (!) 102    In general this is a well appearing caucasian female in no acute distress. She's alert and oriented x4 and appropriate throughout the examination. Cardiopulmonary assessment is  negative for acute distress and she exhibits normal effort. Bilateral breast exam is deferred.    ECOG = 0  0 - Asymptomatic (Fully active, able to carry on all predisease activities without restriction)  1 - Symptomatic but completely ambulatory (Restricted in physically strenuous activity but ambulatory and able to carry out work of a light or sedentary nature. For example, light housework, office work)  2 - Symptomatic, <50% in bed during the day (Ambulatory and capable of all self care but unable to carry out any work activities. Up and about more than 50% of waking hours)  3 -  Symptomatic, >50% in bed, but not bedbound (Capable of only limited self-care, confined to bed or chair 50% or more of waking hours)  4 - Bedbound (Completely disabled. Cannot carry on any self-care. Totally confined to bed or chair)  5 - Death   Eustace Pen MM, Creech RH, Tormey DC, et al. [phone]). "Toxicity and response criteria of the [facility]". [place] Oncol. 5 (6): 649-55    LABORATORY DATA:  Lab Results  Component Value Date   WBC 7.0 12/21/2018   HGB 14.3 12/21/2018   HCT 42.3 12/21/2018   MCV 92 12/21/2018   PLT 325 12/21/2018   Lab Results  Component Value Date   NA 144 12/21/2018   K 3.8 12/21/2018   CL 108 (H) 12/21/2018   CO2 23 12/21/2018   Lab Results  Component Value Date   ALT 10 12/21/2018   AST 15 12/21/2018   ALKPHOS 68 12/21/2018   BILITOT 0.3 12/21/2018      RADIOGRAPHY: No results found.     IMPRESSION/PLAN: 1. Stage IA, [id number] grade 1, ER/PR positive invasive ductal carcinoma of the right breast. Dr. Lisbeth Renshaw discusses the pathology findings and reviews the nature of right breast disease. The consensus from the breast conference includes breast conservation with lumpectomy with sentinel node biopsy.  She would benefit from external radiotherapy to the breast to reduce the risk of local recurrence, followed by antiestrogen therapy. We discussed  the risks, benefits, short, and long term effects of radiotherapy, and the patient is interested in proceeding. Dr. Lisbeth Renshaw discusses the delivery and logistics of radiotherapy and anticipates a course of 4-6 1/2 weeks of radiotherapy. We will see her back about 2 weeks after surgery to discuss the simulation process and anticipate we starting radiotherapy about 4-6 weeks after surgery.    In a visit lasting 60 minutes, greater than 50% of the time was spent face to face reviewing her case, as well as in preparation of, discussing, and coordinating the patient's care.  The above documentation reflects my direct findings during this shared patient visit. Please see the separate note by Dr. Lisbeth Renshaw on this date for the remainder of the patient's plan of care.    Carola Rhine, PAC

## 2019-03-21 NOTE — Therapy (Signed)
[place], [place], [address] Phone: [phone]   Fax:  [phone]  Physical Therapy Evaluation  Patient Details  Name: Cheryl Knight MRN: [id number] Date of Birth: 13-Oct-1955 Referring Provider (Cheryl Knight): Dr. Donnie Mesa   Encounter Date: 03/21/2019  Cheryl Knight End of Session - 03/21/19 1320    Visit Number  1    Number of Visits  2    Date for Cheryl Knight Re-Evaluation  05/16/19    Cheryl Knight Start Time  1340    Cheryl Knight Stop Time  1408    Cheryl Knight Time Calculation (min)  28 min    Activity Tolerance  Patient tolerated treatment well    Behavior During Therapy  [facility] for tasks assessed/performed       Past Medical History:  Diagnosis Date  . Cataracts, bilateral    tbd surgery 2021  . High cholesterol   . Hypertension   . Irritable bowel   . Osteoarthritis   . Osteoarthritis    i.e right knee   . Rheumatoid arthritis ([place])   . Thyroid disease     Past Surgical History:  Procedure Laterality Date  . ABDOMINAL HYSTERECTOMY     age 55 ovaries out  . ESOPHAGEAL DILATION    . TONSILLECTOMY    . TUBAL LIGATION      There were no vitals filed for this visit.   Subjective Assessment - 03/21/19 1313    Subjective  Patient reports she is here today to be seen by her medical team for her newly diagnosed right breast cancer.    Pertinent History  Patient was diagnosed on 02/22/2019 with right invasive ductal carcinoma breast cancer. It measures 5 mm and is located in the lower outer quadrant. It is ER/PR positive and HER2 negative with a Ki67 of 10%. She has rheumatoid arthritis.    Patient Stated Goals  Reduce lymphedema risk and learn post op shoulder ROM HEP    Currently in Pain?  Yes    Pain Score  --   Varies   Pain Location  --   All joints due to RA   Pain Orientation  Right;Left    Pain Descriptors / Indicators  Aching    Pain Type  Chronic pain    Pain Onset  More than a month ago    Pain Frequency  Intermittent     Aggravating Factors   Staying still, sitting    Pain Relieving Factors  Moving         [facility] Cheryl Knight Assessment - 03/21/19 0001      Assessment   Medical Diagnosis  Right breast cancer    Referring Provider (Cheryl Knight)  Dr. Donnie Mesa    Onset Date/Surgical Date  02/22/19    Hand Dominance  Right    Prior Therapy  none      Precautions   Precautions  Other (comment)    Precaution Comments  active cancer      Restrictions   Weight Bearing Restrictions  No      Balance Screen   Has the patient fallen in the past 6 months  No    Has the patient had a decrease in activity level because of a fear of falling?   No    Is the patient reluctant to leave their home because of a fear of falling?   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    Leisure  She does not exercise      Cognition   Overall Cognitive Status  Within Functional Limits for tasks assessed      Posture/Postural Control   Posture/Postural Control  Postural limitations    Postural Limitations  Rounded Shoulders;Forward head      ROM / Strength   AROM / PROM / Strength  AROM;Strength      AROM   AROM Assessment Site  Shoulder    Right/Left Shoulder  Right;Left    Right Shoulder Extension  50 Degrees    Right Shoulder Flexion  145 Degrees    Right Shoulder ABduction  161 Degrees    Right Shoulder Internal Rotation  55 Degrees    Right Shoulder External Rotation  69 Degrees    Left Shoulder Extension  55 Degrees    Left Shoulder Flexion  144 Degrees    Left Shoulder ABduction  153 Degrees    Left Shoulder Internal Rotation  65 Degrees    Left Shoulder External Rotation  80 Degrees      Strength   Overall Strength  Within functional limits for tasks  performed        LYMPHEDEMA/ONCOLOGY QUESTIONNAIRE - 03/21/19 1318      Type   Cancer Type  Right breast cancer      Lymphedema Assessments   Lymphedema Assessments  Upper extremities      Right Upper Extremity Lymphedema   10 cm Proximal to Olecranon Process  33.6 cm    Olecranon Process  28.5 cm    10 cm Proximal to Ulnar Styloid Process  25.5 cm    Just Proximal to Ulnar Styloid Process  19 cm    Across Hand at [employer]  21 cm    At [place] of 2nd Digit  7.6 cm      Left Upper Extremity Lymphedema   10 cm Proximal to Olecranon Process  34.5 cm    Olecranon Process  29.2 cm    10 cm Proximal to Ulnar Styloid Process  26 cm    Just Proximal to Ulnar Styloid Process  19 cm    Across Hand at [employer]  20.8 cm    At [place] of 2nd Digit  7.4 cm          Quick Dash - 03/21/19 0001    Open a tight or new jar  Mild difficulty    Do heavy household chores (wash walls, wash floors)  Mild difficulty    Carry a shopping bag or briefcase  Mild difficulty    Wash your back  Mild difficulty    Use a knife to cut food  Mild difficulty    Recreational activities in which you take some force or impact through your arm, shoulder, or hand (golf, hammering, tennis)  Mild difficulty    During the past week, to what extent has your arm, shoulder or hand problem interfered with your normal social activities with family, friends, neighbors, or groups?  Slightly    During the past week, to what extent has your arm, shoulder or hand problem limited your work or other regular daily activities  Slightly    Arm, shoulder, or hand pain.  Mild  Tingling (pins and needles) in your arm, shoulder, or hand  None    Difficulty Sleeping  Mild difficulty    DASH Score  22.73 %        Objective measurements completed on examination: See above findings.      Patient was instructed today in a home exercise program today for post op shoulder range of motion. These included active assist  shoulder flexion in sitting, scapular retraction, wall walking with shoulder abduction, and hands behind head external rotation.  She was encouraged to do these twice a day, holding 3 seconds and repeating 5 times when permitted by her physician.       Cheryl Knight Education - 03/21/19 1319    Education Details  Lymphedema risk reduction and post op shoulder ROM HEP    Person(s) Educated  Patient    Methods  Explanation;Demonstration;Handout    Comprehension  Returned demonstration;Verbalized understanding          Cheryl Knight Long Term Goals - 03/21/19 1322      Cheryl Knight LONG TERM GOAL #1   Title  Patient will demonstrate she has regained full shoulder ROM and function post operatively compared to baselines.    Time  8    Period  Weeks    Status  New    Target Date  05/16/19      Breast Clinic Goals - 03/21/19 1322      Patient will be able to verbalize understanding of pertinent lymphedema risk reduction practices relevant to her diagnosis specifically related to skin care.   Time  1    Period  Days    Status  Achieved      Patient will be able to return demonstrate and/or verbalize understanding of the post-op home exercise program related to regaining shoulder range of motion.   Time  1    Period  Days    Status  Achieved      Patient will be able to verbalize understanding of the importance of attending the postoperative After Breast Cancer Class for further lymphedema risk reduction education and therapeutic exercise.   Time  1    Period  Days    Status  Achieved            Plan - 03/21/19 1320    Clinical Impression Statement  Patient was diagnosed on 02/22/2019 with right invasive ductal carcinoma breast cancer. It measures 5 mm and is located in the lower outer quadrant. It is ER/PR positive and HER2 negative with a Ki67 of 10%. Her multidisciplinary medical team met prior to her assessments to determine a recommended treatment plan. She is planning to have a right lumpectomy and  sentinel node biopsy followed by radiation and anti-estrogen therapy. She will benefit from a post op Cheryl Knight reassessment to determine needs.    Stability/Clinical Decision Making  Stable/Uncomplicated    Clinical Decision Making  Low    Cheryl Knight Frequency  --   Eval and 1 f/u visit   Cheryl Knight Treatment/Interventions  ADLs/Self Care Home Management;Therapeutic exercise;Patient/family education    Cheryl Knight Next Visit Plan  Will reassess 3-4 weeks post op    Cheryl Knight Home Exercise Plan  Post op shoulder ROM HEP    Consulted and Agree with Plan of Care  Patient       Patient will benefit from skilled therapeutic intervention in order to improve the following deficits and impairments:  Postural dysfunction, Decreased range of motion, Pain, Impaired UE functional use, Decreased knowledge of precautions  Visit  Diagnosis: Malignant neoplasm of lower-outer quadrant of right breast of female, estrogen receptor positive ([place]) - Plan: Cheryl Knight plan of care cert/re-cert  Abnormal posture - Plan: Cheryl Knight plan of care cert/re-cert   Patient will follow up at outpatient cancer rehab 3-4 weeks following surgery.  If the patient requires physical therapy at that time, a specific plan will be dictated and sent to the referring physician for approval. The patient was educated today on appropriate basic range of motion exercises to begin post operatively and the importance of attending the After Breast Cancer class following surgery.  Patient was educated today on lymphedema risk reduction practices as it pertains to recommendations that will benefit the patient immediately following surgery.  She verbalized good understanding.      Problem List Patient Active Problem List   Diagnosis Date Noted  . Malignant neoplasm of lower-outer quadrant of right breast of female, estrogen receptor positive ([place]) 03/19/2019  . Type 2 diabetes mellitus without complication, without long-term current use of insulin ([place]) 02/08/2019  . Allergic rhinitis  02/08/2019  . Osteoarthritis   . Primary osteoarthritis involving multiple joints 11/15/2017  . Moderate asthma with acute exacerbation 05/03/2017  . Routine general medical examination at a health care facility 02/08/2017  . Primary osteoarthritis of both feet 02/10/2016  . Hx of migraine headaches 02/07/2016  . History of hypothyroidism 02/07/2016  . History of gastroesophageal reflux (GERD) 02/07/2016  . Obese 12/30/2015  . Basal cell carcinoma of chest wall 10/05/2011  . Hypertension 09/01/2010  . Hyperlipidemia 02/27/2008  . Hypothyroidism 02/21/2007  . Migraine 02/21/2007  . VAGINITIS, ATROPHIC 02/21/2007  . Psoriasis 02/21/2007  . Sicca syndrome, unspecified ([place]) 02/21/2007  . GERD 06/29/2006  . Rheumatoid arthritis ([place]) 06/29/2006   Cheryl Knight, Cheryl Knight 03/21/19 2:16 PM  [place], [place], [address] Phone: [phone]   Fax:  [phone]  Name: Cheryl Knight MRN: [id number] Date of Birth: 04/19/1955

## 2019-03-21 NOTE — Progress Notes (Signed)
Clinical Social Work [place] Psychosocial Distress Screening [place]   Patient completed distress screening protocol and scored a 3 on the Psychosocial Distress Thermometer which indicates mild distress. Clinical Education officer, museum met with patient and patient's Spouse in [facility] to assess for distress and other psychosocial needs.  Patient stated she was feeling overwhelmed but felt better after meeting with the treatment team and getting more information on her treatment plan.  Wondering what her insurance will cover- is aware of her co-insurance and OOP maximum so feels more confident in understanding what her costs will be. CSW also gave information on [employer].  CSW and patient discussed common feeling and emotions when being diagnosed with cancer, and the importance of support during treatment.  CSW informed patient of the support team and support services at [facility].  CSW provided contact information and encouraged patient to call with any questions or concerns.    Distress Screen: ONCBCN DISTRESS SCREENING 03/21/2019  Screening Type Initial Screening  Distress experienced in past week (1-10) 3  Practical problem type Insurance  Emotional problem type Adjusting to illness  Information Concerns Type Lack of info about treatment;Lack of info about complementary therapy choices;Lack of info about maintaining fitness  Physical Problem type Pain;Getting around;Constipation/diarrhea;Skin dry/itchy  Physician notified of physical symptoms Yes  Referral to clinical psychology No  Referral to clinical social work No  Referral to dietition No  Referral to financial advocate No  Referral to support programs Yes  Referral to palliative care No     [place]

## 2019-03-22 ENCOUNTER — Telehealth: Payer: Self-pay | Admitting: Hematology

## 2019-03-22 NOTE — Telephone Encounter (Signed)
No los per 3/3. 

## 2019-03-23 ENCOUNTER — Other Ambulatory Visit: Payer: Self-pay

## 2019-03-23 ENCOUNTER — Encounter (HOSPITAL_BASED_OUTPATIENT_CLINIC_OR_DEPARTMENT_OTHER): Payer: Self-pay | Admitting: Surgery

## 2019-03-23 ENCOUNTER — Telehealth: Payer: Self-pay | Admitting: *Deleted

## 2019-03-23 NOTE — Telephone Encounter (Signed)
Called pt in regards to [facility] from 3.3.21. Denies questions or concerns regarding dx or treatment care plan. Encourage pt to call with needs. Received verbal understanding. Discussed next steps of sx on 3/10 and scheduling to see Dr. Burr Medico and Dr. Lisbeth Renshaw after sx.

## 2019-03-24 ENCOUNTER — Other Ambulatory Visit: Payer: Self-pay | Admitting: Rheumatology

## 2019-03-24 ENCOUNTER — Other Ambulatory Visit (HOSPITAL_COMMUNITY)
Admission: RE | Admit: 2019-03-24 | Discharge: 2019-03-24 | Disposition: A | Discharge status: Home / Self Care | Payer: PPO | Source: Ambulatory Visit | Attending: Surgery | Admitting: Surgery

## 2019-03-24 DIAGNOSIS — Z01812 Encounter for preprocedural laboratory examination: Secondary | ICD-10-CM | POA: Diagnosis not present

## 2019-03-24 DIAGNOSIS — Z20822 Contact with and (suspected) exposure to covid-19: Secondary | ICD-10-CM | POA: Diagnosis not present

## 2019-03-24 LAB — SARS CORONAVIRUS 2 (TAT 6-24 HRS): SARS Coronavirus 2: NEGATIVE

## 2019-03-26 ENCOUNTER — Encounter: Payer: Self-pay | Admitting: Internal Medicine

## 2019-03-26 NOTE — Progress Notes (Signed)
Called and disc with patient dx and sorry to hear about this but appears tx plan in place  Plan  Surgery implant 03/27/19  Lumpectomy and removal 2 Lns 03/28/19  If LN clear radiation in 4 weeks, if not chemo then radiation    Dr. Estanislado Pandy (rheum) 1 week after surgery due to no MTX after surgery x 4 weeks so getting a plan together for this  To see what she can do instead   [place]

## 2019-03-26 NOTE — Progress Notes (Signed)
Faxed completed order forms to [facility] for patient's Ultrasound guided biopsy and diagnostic mammogram with ultrasound.

## 2019-03-26 NOTE — Telephone Encounter (Signed)
Last Visit:11/01/2018 Next Visit:04/05/2019 Labs: 03/21/19 Glucose 168, Potassium 3.4  Okay to refill per Dr. Estanislado Pandy

## 2019-03-27 ENCOUNTER — Encounter (HOSPITAL_BASED_OUTPATIENT_CLINIC_OR_DEPARTMENT_OTHER)
Admission: RE | Admit: 2019-03-27 | Discharge: 2019-03-27 | Disposition: A | Discharge status: Home / Self Care | Payer: PPO | Source: Ambulatory Visit | Attending: Surgery | Admitting: Surgery

## 2019-03-27 DIAGNOSIS — Z01810 Encounter for preprocedural cardiovascular examination: Secondary | ICD-10-CM | POA: Insufficient documentation

## 2019-03-27 DIAGNOSIS — R9431 Abnormal electrocardiogram [ECG] [EKG]: Secondary | ICD-10-CM | POA: Insufficient documentation

## 2019-03-27 DIAGNOSIS — C50511 Malignant neoplasm of lower-outer quadrant of right female breast: Secondary | ICD-10-CM | POA: Diagnosis not present

## 2019-03-27 NOTE — Progress Notes (Signed)

## 2019-03-28 ENCOUNTER — Encounter (HOSPITAL_BASED_OUTPATIENT_CLINIC_OR_DEPARTMENT_OTHER): Payer: Self-pay | Admitting: Surgery

## 2019-03-28 ENCOUNTER — Ambulatory Visit (HOSPITAL_BASED_OUTPATIENT_CLINIC_OR_DEPARTMENT_OTHER): Payer: PPO | Admitting: Certified Registered Nurse Anesthetist

## 2019-03-28 ENCOUNTER — Other Ambulatory Visit: Payer: Self-pay

## 2019-03-28 ENCOUNTER — Encounter (HOSPITAL_BASED_OUTPATIENT_CLINIC_OR_DEPARTMENT_OTHER)
Admission: RE | Disposition: A | Discharge status: Home / Self Care | Payer: Self-pay | Source: Home / Self Care | Attending: Surgery

## 2019-03-28 ENCOUNTER — Ambulatory Visit (HOSPITAL_BASED_OUTPATIENT_CLINIC_OR_DEPARTMENT_OTHER)
Admission: RE | Admit: 2019-03-28 | Discharge: 2019-03-28 | Disposition: A | Discharge status: Home / Self Care | Payer: PPO | Attending: Surgery | Admitting: Surgery

## 2019-03-28 ENCOUNTER — Ambulatory Visit (HOSPITAL_COMMUNITY)
Admission: RE | Admit: 2019-03-28 | Discharge: 2019-03-28 | Disposition: A | Discharge status: Home / Self Care | Payer: PPO | Source: Ambulatory Visit | Attending: Surgery | Admitting: Surgery

## 2019-03-28 DIAGNOSIS — D0511 Intraductal carcinoma in situ of right breast: Secondary | ICD-10-CM | POA: Diagnosis not present

## 2019-03-28 DIAGNOSIS — Z87891 Personal history of nicotine dependence: Secondary | ICD-10-CM | POA: Diagnosis not present

## 2019-03-28 DIAGNOSIS — G43909 Migraine, unspecified, not intractable, without status migrainosus: Secondary | ICD-10-CM | POA: Insufficient documentation

## 2019-03-28 DIAGNOSIS — C50911 Malignant neoplasm of unspecified site of right female breast: Secondary | ICD-10-CM | POA: Diagnosis not present

## 2019-03-28 DIAGNOSIS — M1990 Unspecified osteoarthritis, unspecified site: Secondary | ICD-10-CM | POA: Insufficient documentation

## 2019-03-28 DIAGNOSIS — M069 Rheumatoid arthritis, unspecified: Secondary | ICD-10-CM | POA: Diagnosis not present

## 2019-03-28 DIAGNOSIS — Z90710 Acquired absence of both cervix and uterus: Secondary | ICD-10-CM | POA: Insufficient documentation

## 2019-03-28 DIAGNOSIS — G8918 Other acute postprocedural pain: Secondary | ICD-10-CM | POA: Diagnosis not present

## 2019-03-28 DIAGNOSIS — K219 Gastro-esophageal reflux disease without esophagitis: Secondary | ICD-10-CM | POA: Diagnosis not present

## 2019-03-28 DIAGNOSIS — E785 Hyperlipidemia, unspecified: Secondary | ICD-10-CM | POA: Insufficient documentation

## 2019-03-28 DIAGNOSIS — Z9841 Cataract extraction status, right eye: Secondary | ICD-10-CM | POA: Diagnosis not present

## 2019-03-28 DIAGNOSIS — J45909 Unspecified asthma, uncomplicated: Secondary | ICD-10-CM | POA: Diagnosis not present

## 2019-03-28 DIAGNOSIS — I10 Essential (primary) hypertension: Secondary | ICD-10-CM | POA: Insufficient documentation

## 2019-03-28 DIAGNOSIS — Z888 Allergy status to other drugs, medicaments and biological substances status: Secondary | ICD-10-CM | POA: Diagnosis not present

## 2019-03-28 DIAGNOSIS — C50511 Malignant neoplasm of lower-outer quadrant of right female breast: Secondary | ICD-10-CM | POA: Diagnosis not present

## 2019-03-28 DIAGNOSIS — Z170 Estrogen receptor positive status [ER+]: Secondary | ICD-10-CM | POA: Insufficient documentation

## 2019-03-28 DIAGNOSIS — E039 Hypothyroidism, unspecified: Secondary | ICD-10-CM | POA: Diagnosis not present

## 2019-03-28 DIAGNOSIS — Z79899 Other long term (current) drug therapy: Secondary | ICD-10-CM | POA: Insufficient documentation

## 2019-03-28 DIAGNOSIS — Z9842 Cataract extraction status, left eye: Secondary | ICD-10-CM | POA: Diagnosis not present

## 2019-03-28 DIAGNOSIS — E119 Type 2 diabetes mellitus without complications: Secondary | ICD-10-CM | POA: Diagnosis not present

## 2019-03-28 HISTORY — DX: Hypothyroidism, unspecified: E03.9

## 2019-03-28 HISTORY — DX: Prediabetes: R73.03

## 2019-03-28 HISTORY — DX: Gastro-esophageal reflux disease without esophagitis: K21.9

## 2019-03-28 HISTORY — DX: Other complications of anesthesia, initial encounter: T88.59XA

## 2019-03-28 HISTORY — DX: Nausea with vomiting, unspecified: Z98.890

## 2019-03-28 HISTORY — DX: Headache, unspecified: R51.9

## 2019-03-28 HISTORY — DX: Nausea with vomiting, unspecified: R11.2

## 2019-03-28 HISTORY — DX: Cough, unspecified: R05.9

## 2019-03-28 HISTORY — DX: Other specified postprocedural states: R11.2

## 2019-03-28 HISTORY — PX: BREAST LUMPECTOMY WITH RADIOACTIVE SEED AND SENTINEL LYMPH NODE BIOPSY: SHX6550

## 2019-03-28 SURGERY — BREAST LUMPECTOMY WITH RADIOACTIVE SEED AND SENTINEL LYMPH NODE BIOPSY
Anesthesia: General | Site: Breast | Laterality: Right

## 2019-03-28 MED ORDER — METHYLENE BLUE 0.5 % INJ SOLN
INTRAVENOUS | Status: AC
  Filled 2019-03-28: qty 10

## 2019-03-28 MED ORDER — MIDAZOLAM HCL 2 MG/2ML IJ SOLN
INTRAMUSCULAR | Status: AC
  Filled 2019-03-28: qty 2

## 2019-03-28 MED ORDER — LIDOCAINE 2% (20 MG/ML) 5 ML SYRINGE
INTRAMUSCULAR | Status: AC
  Filled 2019-03-28: qty 5

## 2019-03-28 MED ORDER — DEXAMETHASONE SODIUM PHOSPHATE 10 MG/ML IJ SOLN
INTRAMUSCULAR | Status: DC | PRN
  Administered 2019-03-28: 10 mg via INTRAVENOUS

## 2019-03-28 MED ORDER — METHYLENE BLUE 0.5 % INJ SOLN
INTRAVENOUS | Status: DC | PRN
  Administered 2019-03-28: 4 mL via INTRADERMAL

## 2019-03-28 MED ORDER — HYDROMORPHONE HCL 1 MG/ML IJ SOLN: 0.2500 mg | INTRAMUSCULAR | Status: DC | PRN

## 2019-03-28 MED ORDER — HYDROCODONE-ACETAMINOPHEN 5-325 MG PO TABS: 1.0000 | ORAL_TABLET | Freq: Four times a day (QID) | ORAL | 0 refills | Status: DC | PRN

## 2019-03-28 MED ORDER — TECHNETIUM TC 99M SULFUR COLLOID FILTERED
1.0000 | Freq: Once | INTRAVENOUS | Status: AC | PRN
  Administered 2019-03-28: 1 via INTRADERMAL

## 2019-03-28 MED ORDER — SODIUM CHLORIDE (PF) 0.9 % IJ SOLN
INTRAMUSCULAR | Status: DC | PRN
  Administered 2019-03-28: 1 mL via INTRAVENOUS

## 2019-03-28 MED ORDER — CEFAZOLIN SODIUM-DEXTROSE 2-4 GM/100ML-% IV SOLN
2.0000 g | INTRAVENOUS | Status: AC
  Administered 2019-03-28: 2 g via INTRAVENOUS

## 2019-03-28 MED ORDER — BUPIVACAINE HCL (PF) 0.25 % IJ SOLN
INTRAMUSCULAR | Status: AC
  Filled 2019-03-28: qty 30

## 2019-03-28 MED ORDER — CEFAZOLIN SODIUM-DEXTROSE 2-4 GM/100ML-% IV SOLN
INTRAVENOUS | Status: AC
  Filled 2019-03-28: qty 100

## 2019-03-28 MED ORDER — CLONIDINE HCL (ANALGESIA) 100 MCG/ML EP SOLN
EPIDURAL | Status: DC | PRN
  Administered 2019-03-28: 80 ug

## 2019-03-28 MED ORDER — EPHEDRINE SULFATE-NACL 50-0.9 MG/10ML-% IV SOSY
PREFILLED_SYRINGE | INTRAVENOUS | Status: DC | PRN
  Administered 2019-03-28: 10 mg via INTRAVENOUS
  Administered 2019-03-28: 15 mg via INTRAVENOUS
  Administered 2019-03-28: 10 mg via INTRAVENOUS
  Administered 2019-03-28: 5 mg via INTRAVENOUS
  Administered 2019-03-28: 10 mg via INTRAVENOUS

## 2019-03-28 MED ORDER — PROMETHAZINE HCL 25 MG/ML IJ SOLN: 6.2500 mg | INTRAMUSCULAR | Status: DC | PRN

## 2019-03-28 MED ORDER — BUPIVACAINE HCL (PF) 0.5 % IJ SOLN
INTRAMUSCULAR | Status: DC | PRN
  Administered 2019-03-28: 30 mL

## 2019-03-28 MED ORDER — SODIUM CHLORIDE (PF) 0.9 % IJ SOLN
INTRAMUSCULAR | Status: AC
  Filled 2019-03-28: qty 10

## 2019-03-28 MED ORDER — CHLORHEXIDINE GLUCONATE CLOTH 2 % EX PADS: 6.0000 | MEDICATED_PAD | Freq: Once | CUTANEOUS | Status: DC

## 2019-03-28 MED ORDER — LACTATED RINGERS IV SOLN: INTRAVENOUS | Status: DC

## 2019-03-28 MED ORDER — ACETAMINOPHEN 500 MG PO TABS
1000.0000 mg | ORAL_TABLET | ORAL | Status: AC
  Administered 2019-03-28: 08:00:00 1000 mg via ORAL

## 2019-03-28 MED ORDER — LIDOCAINE 2% (20 MG/ML) 5 ML SYRINGE
INTRAMUSCULAR | Status: DC | PRN
  Administered 2019-03-28: 60 mg via INTRAVENOUS

## 2019-03-28 MED ORDER — BUPIVACAINE HCL 0.25 % IJ SOLN
INTRAMUSCULAR | Status: DC | PRN
  Administered 2019-03-28 (×2): 10 mL

## 2019-03-28 MED ORDER — ACETAMINOPHEN 500 MG PO TABS
ORAL_TABLET | ORAL | Status: AC
  Filled 2019-03-28: qty 2

## 2019-03-28 MED ORDER — FENTANYL CITRATE (PF) 100 MCG/2ML IJ SOLN
INTRAMUSCULAR | Status: AC
  Filled 2019-03-28: qty 2

## 2019-03-28 MED ORDER — PROPOFOL 10 MG/ML IV BOLUS
INTRAVENOUS | Status: DC | PRN
  Administered 2019-03-28: 160 mg via INTRAVENOUS

## 2019-03-28 MED ORDER — DEXAMETHASONE SODIUM PHOSPHATE 10 MG/ML IJ SOLN
INTRAMUSCULAR | Status: AC
  Filled 2019-03-28: qty 1

## 2019-03-28 MED ORDER — ONDANSETRON HCL 4 MG/2ML IJ SOLN
INTRAMUSCULAR | Status: DC | PRN
  Administered 2019-03-28: 4 mg via INTRAVENOUS

## 2019-03-28 MED ORDER — PHENYLEPHRINE 40 MCG/ML (10ML) SYRINGE FOR IV PUSH (FOR BLOOD PRESSURE SUPPORT)
PREFILLED_SYRINGE | INTRAVENOUS | Status: DC | PRN
  Administered 2019-03-28 (×2): 80 ug via INTRAVENOUS
  Administered 2019-03-28: 120 ug via INTRAVENOUS
  Administered 2019-03-28: 80 ug via INTRAVENOUS
  Administered 2019-03-28: 40 ug via INTRAVENOUS
  Administered 2019-03-28: 80 ug via INTRAVENOUS
  Administered 2019-03-28: 40 ug via INTRAVENOUS
  Administered 2019-03-28 (×2): 80 ug via INTRAVENOUS

## 2019-03-28 MED ORDER — MIDAZOLAM HCL 2 MG/2ML IJ SOLN
1.0000 mg | INTRAMUSCULAR | Status: DC | PRN
  Administered 2019-03-28: 2 mg via INTRAVENOUS

## 2019-03-28 MED ORDER — FENTANYL CITRATE (PF) 100 MCG/2ML IJ SOLN
50.0000 ug | INTRAMUSCULAR | Status: DC | PRN
  Administered 2019-03-28 (×2): 50 ug via INTRAVENOUS

## 2019-03-28 MED ORDER — BUPIVACAINE HCL (PF) 0.5 % IJ SOLN
INTRAMUSCULAR | Status: AC
  Filled 2019-03-28: qty 30

## 2019-03-28 MED ORDER — GABAPENTIN 300 MG PO CAPS
300.0000 mg | ORAL_CAPSULE | ORAL | Status: AC
  Administered 2019-03-28: 300 mg via ORAL

## 2019-03-28 MED ORDER — ONDANSETRON HCL 4 MG/2ML IJ SOLN
INTRAMUSCULAR | Status: AC
  Filled 2019-03-28: qty 2

## 2019-03-28 MED ORDER — FENTANYL CITRATE (PF) 100 MCG/2ML IJ SOLN
INTRAMUSCULAR | Status: DC | PRN
  Administered 2019-03-28 (×2): 50 ug via INTRAVENOUS

## 2019-03-28 MED ORDER — LIDOCAINE-EPINEPHRINE 1 %-1:100000 IJ SOLN
INTRAMUSCULAR | Status: AC
  Filled 2019-03-28: qty 1

## 2019-03-28 MED ORDER — GABAPENTIN 300 MG PO CAPS
ORAL_CAPSULE | ORAL | Status: AC
  Filled 2019-03-28: qty 1

## 2019-03-28 SURGICAL SUPPLY — 59 items
APL PRP STRL LF DISP 70% ISPRP (MISCELLANEOUS) ×1
APL SKNCLS STERI-STRIP NONHPOA (GAUZE/BANDAGES/DRESSINGS) ×2
APPLIER CLIP 9.375 MED OPEN (MISCELLANEOUS)
APR CLP MED 9.3 20 MLT OPN (MISCELLANEOUS)
BENZOIN TINCTURE PRP APPL 2/3 (GAUZE/BANDAGES/DRESSINGS) ×3 IMPLANT
BLADE CLIPPER SURG (BLADE) IMPLANT
BLADE HEX COATED 2.75 (ELECTRODE) ×2 IMPLANT
BLADE SURG 10 STRL SS (BLADE) ×1 IMPLANT
BLADE SURG 15 STRL LF DISP TIS (BLADE) ×1 IMPLANT
BLADE SURG 15 STRL SS (BLADE) ×2
BNDG COHESIVE 3X5 TAN STRL LF (GAUZE/BANDAGES/DRESSINGS) IMPLANT
CANISTER SUCT 1200ML W/VALVE (MISCELLANEOUS) ×2 IMPLANT
CHLORAPREP W/TINT 26 (MISCELLANEOUS) ×2 IMPLANT
CLIP APPLIE 9.375 MED OPEN (MISCELLANEOUS) IMPLANT
COVER BACK TABLE 60X90IN (DRAPES) ×2 IMPLANT
COVER MAYO STAND STRL (DRAPES) ×2 IMPLANT
COVER PROBE W GEL 5X96 (DRAPES) ×2 IMPLANT
COVER WAND RF STERILE (DRAPES) IMPLANT
DECANTER SPIKE VIAL GLASS SM (MISCELLANEOUS) ×1 IMPLANT
DRAIN CHANNEL 19F RND (DRAIN) IMPLANT
DRAIN HEMOVAC 1/8 X 5 (WOUND CARE) IMPLANT
DRAPE LAPAROSCOPIC ABDOMINAL (DRAPES) ×2 IMPLANT
DRAPE UTILITY XL STRL (DRAPES) ×2 IMPLANT
DRSG TEGADERM 4X4.75 (GAUZE/BANDAGES/DRESSINGS) ×4 IMPLANT
ELECT REM PT RETURN 9FT ADLT (ELECTROSURGICAL) ×2
ELECTRODE REM PT RTRN 9FT ADLT (ELECTROSURGICAL) ×1 IMPLANT
EVACUATOR SILICONE 100CC (DRAIN) IMPLANT
GAUZE SPONGE 4X4 12PLY STRL LF (GAUZE/BANDAGES/DRESSINGS) ×2 IMPLANT
GLOVE BIO SURGEON STRL SZ7 (GLOVE) ×2 IMPLANT
GLOVE BIOGEL PI IND STRL 7.5 (GLOVE) ×2 IMPLANT
GLOVE BIOGEL PI INDICATOR 7.5 (GLOVE) ×2
GOWN STRL REUS W/ TWL LRG LVL3 (GOWN DISPOSABLE) ×1 IMPLANT
GOWN STRL REUS W/TWL LRG LVL3 (GOWN DISPOSABLE) ×2
ILLUMINATOR WAVEGUIDE N/F (MISCELLANEOUS) IMPLANT
KIT MARKER MARGIN INK (KITS) ×1 IMPLANT
LIGHT WAVEGUIDE WIDE FLAT (MISCELLANEOUS) IMPLANT
NDL HYPO 25X1 1.5 SAFETY (NEEDLE) ×2 IMPLANT
NDL SAFETY ECLIPSE 18X1.5 (NEEDLE) ×1 IMPLANT
NEEDLE HYPO 18GX1.5 SHARP (NEEDLE) ×2
NEEDLE HYPO 25X1 1.5 SAFETY (NEEDLE) ×4 IMPLANT
NS IRRIG 1000ML POUR BTL (IV SOLUTION) ×2 IMPLANT
PACK BASIN DAY SURGERY FS (CUSTOM PROCEDURE TRAY) ×2 IMPLANT
PENCIL SMOKE EVACUATOR (MISCELLANEOUS) ×2 IMPLANT
SHEET MEDIUM DRAPE 40X70 STRL (DRAPES) IMPLANT
SLEEVE SCD COMPRESS KNEE MED (MISCELLANEOUS) ×2 IMPLANT
SPONGE LAP 18X18 RF (DISPOSABLE) ×1 IMPLANT
SPONGE LAP 4X18 RFD (DISPOSABLE) ×2 IMPLANT
STOCKINETTE IMPERVIOUS LG (DRAPES) IMPLANT
STRIP CLOSURE SKIN 1/2X4 (GAUZE/BANDAGES/DRESSINGS) ×3 IMPLANT
SUT MON AB 4-0 PC3 18 (SUTURE) ×3 IMPLANT
SUT SILK 2 0 SH (SUTURE) IMPLANT
SUT VIC AB 3-0 SH 27 (SUTURE) ×4
SUT VIC AB 3-0 SH 27X BRD (SUTURE) ×1 IMPLANT
SYR BULB IRRIGATION 50ML (SYRINGE) ×1 IMPLANT
SYR CONTROL 10ML LL (SYRINGE) ×4 IMPLANT
TOWEL GREEN STERILE FF (TOWEL DISPOSABLE) ×2 IMPLANT
TRAY FAXITRON CT DISP (TRAY / TRAY PROCEDURE) ×2 IMPLANT
TUBE CONNECTING 20X1/4 (TUBING) ×2 IMPLANT
YANKAUER SUCT BULB TIP NO VENT (SUCTIONS) ×2 IMPLANT

## 2019-03-28 NOTE — Progress Notes (Signed)
Assisted Dr. Germeroth with right, ultrasound guided, pectoralis block. Side rails up, monitors on throughout procedure. See vital signs in flow sheet. Tolerated Procedure well. 

## 2019-03-28 NOTE — Anesthesia Procedure Notes (Signed)
Anesthesia Regional Block: Pectoralis block   Pre-Anesthetic Checklist: ,, timeout performed, Correct Patient, Correct Site, Correct Laterality, Correct Procedure, Correct Position, site marked, Risks and benefits discussed,  Surgical consent,  Pre-op evaluation,  At surgeon's request and post-op pain management  Laterality: Right  Prep: chloraprep       Needles:   Needle Type: Stimiplex     Needle Length: 9cm      Additional Needles:   Procedures:,,,, ultrasound used (permanent image in chart),,,,  Narrative:  Start time: 03/28/2019 8:45 AM End time: 03/28/2019 8:51 AM Injection made incrementally with aspirations every 5 mL.  Performed by: Personally  Anesthesiologist: Nolon Nations, MD  Additional Notes: Patient tolerated well. Good fascial spread noted.

## 2019-03-28 NOTE — Interval H&P Note (Signed)
History and Physical Interval Note:  03/28/2019 8:22 AM  Cheryl Knight  has presented today for surgery, with the diagnosis of RIGHT INVASIVE DUCTAL CARCINOMA.  The various methods of treatment have been discussed with the patient and family. After consideration of risks, benefits and other options for treatment, the patient has consented to  Procedure(s) with comments: RIGHT BREAST LUMPECTOMY WITH RADIOACTIVE SEED AND SENTINEL LYMPH NODE BIOPSY (Right) - PEC BLOCK FOR POST OP PAIN as a surgical intervention.  The patient's history has been reviewed, patient examined, no change in status, stable for surgery.  I have reviewed the patient's chart and labs.  Questions were answered to the patient's satisfaction.     Maia Petties

## 2019-03-28 NOTE — Op Note (Addendum)
Pre-op Diagnosis:  Right invasive ductal carcinoma Post-op Diagnosis: same Procedure:  Right radioactive seed localized lumpectomy with sentinel lymph node biopsy/ blue dye injection Surgeon:  Sherrelle Prochazka K. Anesthesia:  GEN - LMA/ Pec block Indications:  This is a 64 year old female who presents after recent routine screening mammogram detected an abnormality in the right breast. She underwent further work-up including biopsy that revealed breast cancer. Mammogram estimates the size at 5 mm. The patient has had no previous breast problems except fibrocystic changes and previous diagnostic mammograms. no previous biopsies. Her mother had breast cancer at age 64.  The patient has rheumatoid arthritis and uses weekly Methotrexate injections. She uses Premarin cream  Mammo 03/09/19 - 5 mm mass right breast 0700 anterior depth  Biopsy 03/14/19 - Invasive ductal carcinoma - Grade 1, ER/PR positive, Ki67 10%, Her 2 negative  Description of procedure: The patient received a pec block as well as nuclear medicine injection in the pre-operative area.  A seed was placed yesterday by radiology.  The patient is brought to the operating room placed in supine position on the operating room table. After an adequate level of general anesthesia was obtained, I injected her right breast with methylene blue dye solution.  The right chest and axilla were prepped with ChloraPrep and draped in sterile fashion. A timeout was taken to ensure the proper patient and proper procedure. We interrogated the breast with the neoprobe. We made a circumareolar incision around the lower side of the nipple after infiltrating with 0.25% Marcaine. Dissection was carried down in the breast tissue with cautery. We used the neoprobe to guide [place] towards the radioactive seed. We excised an area of tissue around the radioactive seed 2 cm in diameter. The specimen was removed and was oriented with a paint kit. Specimen mammogram showed the  radioactive seed as well as the biopsy clip within the specimen. This was sent for pathologic examination. There is no residual radioactivity within the biopsy cavity. We inspected carefully for hemostasis.   We then turned our attention to the axilla.  I interrogated the axilla with the neoprobe on the correct settings and we identified an area of activity.  I made a transverse incision across the axilla.  We dissected into the axillary contents.  I identified to sentinel lymph nodes that were hot.  No blue dye was noted.  These lymph nodes were sent separately as sentinel lymph node #1 and sentinel lymph node #2.  No residual background activity was noted.  We irrigated the wound thoroughly and inspected for hemostasis.  Both wound were closed with a deep layer of 3-0 Vicryl and a subcuticular layer of 4-0 Monocryl. Benzoin Steri-Strips were applied. The patient was then extubated and brought to the recovery room in stable condition. All sponge, instrument, and needle counts are correct.  Imogene Burn. Georgette Dover, MD, [facility] Surgery  General/ Trauma Surgery  03/28/2019 10:49 AM

## 2019-03-28 NOTE — Anesthesia Postprocedure Evaluation (Signed)
Anesthesia Post Note  Patient: Cheryl Knight  Procedure(s) Performed: RIGHT BREAST LUMPECTOMY WITH RADIOACTIVE SEED AND SENTINEL LYMPH NODE BIOPSY (Right Breast)     Patient location during evaluation: PACU Anesthesia Type: General Level of consciousness: sedated and patient cooperative Pain management: pain level controlled Vital Signs Assessment: post-procedure vital signs reviewed and stable Respiratory status: spontaneous breathing Cardiovascular status: stable Anesthetic complications: no    Last Vitals:  Vitals:   03/28/19 1115 03/28/19 1126  BP: (!) 96/58 99/63  Pulse: 88 93  Resp: 15 18  Temp:  36.6 C  SpO2: 99% 96%    Last Pain:  Vitals:   03/28/19 1126  TempSrc: Oral  PainSc: 0-No pain                 Nolon Nations

## 2019-03-28 NOTE — Transfer of Care (Signed)
Immediate Anesthesia Transfer of Care Note  Patient: Cheryl Knight  Procedure(s) Performed: RIGHT BREAST LUMPECTOMY WITH RADIOACTIVE SEED AND SENTINEL LYMPH NODE BIOPSY (Right Breast)  Patient Location: PACU  Anesthesia Type:GA combined with regional for post-op pain  Level of Consciousness: awake, alert  and oriented  Airway & Oxygen Therapy: Patient Spontanous Breathing and Patient connected to face mask oxygen  Post-op Assessment: Report given to RN and Post -op Vital signs reviewed and stable  Post vital signs: Reviewed and stable  Last Vitals:  Vitals Value Taken Time  BP 99/53 03/28/19 1048  Temp    Pulse 83 03/28/19 1051  Resp 13 03/28/19 1051  SpO2 96 % 03/28/19 1051  Vitals shown include unvalidated device data.  Last Pain:  Vitals:   03/28/19 0743  TempSrc: Tympanic  PainSc: 0-No pain         Complications: No apparent anesthesia complications

## 2019-03-28 NOTE — Anesthesia Procedure Notes (Signed)
Procedure Name: LMA Insertion Date/Time: 03/28/2019 9:28 AM Performed by: Genelle Bal, CRNA Pre-anesthesia Checklist: Patient identified, Emergency Drugs available, Suction available and Patient being monitored Patient Re-evaluated:Patient Re-evaluated prior to induction Oxygen Delivery Method: Circle system utilized Preoxygenation: Pre-oxygenation with 100% oxygen Induction Type: IV induction Ventilation: Mask ventilation without difficulty LMA: LMA inserted LMA Size: 4.0 Number of attempts: 1 Airway Equipment and Method: Bite block Placement Confirmation: positive ETCO2 Tube secured with: Tape Dental Injury: Teeth and Oropharynx as per pre-operative assessment

## 2019-03-28 NOTE — Discharge Instructions (Signed)
No Tylenol before 1:45pm today.  [place] Office Phone Number [phone]  BREAST BIOPSY/ PARTIAL MASTECTOMY: POST OP INSTRUCTIONS  Always review your discharge instruction sheet given to you by the facility where your surgery was performed.  IF YOU HAVE DISABILITY OR FAMILY LEAVE FORMS, YOU MUST BRING THEM TO THE OFFICE FOR PROCESSING.  DO NOT GIVE THEM TO YOUR DOCTOR.  1. A prescription for pain medication may be given to you upon discharge.  Take your pain medication as prescribed, if needed.  If narcotic pain medicine is not needed, then you may take acetaminophen (Tylenol) or ibuprofen (Advil) as needed. 2. Take your usually prescribed medications unless otherwise directed 3. If you need a refill on your pain medication, please contact your pharmacy.  They will contact our office to request authorization.  Prescriptions will not be filled after 5pm or on week-ends. 4. You should eat very light the first 24 hours after surgery, such as soup, crackers, pudding, etc.  Resume your normal diet the day after surgery. 5. Most patients will experience some swelling and bruising in the breast.  Ice packs and a good support bra will help.  Swelling and bruising can take several days to resolve.  6. It is common to experience some constipation if taking pain medication after surgery.  Increasing fluid intake and taking a stool softener will usually help or prevent this problem from occurring.  A mild laxative (Milk of Magnesia or Miralax) should be taken according to package directions if there are no bowel movements after 48 hours. 7. Unless discharge instructions indicate otherwise, you may remove your bandages 24-48 hours after surgery, and you may shower at that time.  You may have steri-strips (small skin tapes) in place directly over the incision.  These strips should be left on the skin for 7-10 days.  If your surgeon used skin glue on the incision, you may shower in 24 hours.   The glue will flake off over the next 2-3 weeks.  Any sutures or staples will be removed at the office during your follow-up visit. 8. ACTIVITIES:  You may resume regular daily activities (gradually increasing) beginning the next day.  Wearing a good support bra or sports bra minimizes pain and swelling.  You may have sexual intercourse when it is comfortable. a. You may drive when you no longer are taking prescription pain medication, you can comfortably wear a seatbelt, and you can safely maneuver your car and apply brakes. b. RETURN TO WORK:  ______________________________________________________________________________________ 9. You should see your doctor in the office for a follow-up appointment approximately two weeks after your surgery.  Your doctor's nurse will typically make your follow-up appointment when she calls you with your pathology report.  Expect your pathology report 2-3 business days after your surgery.  You may call to check if you do not hear from [place] after three days. 10. OTHER INSTRUCTIONS: _______________________________________________________________________________________________ _____________________________________________________________________________________________________________________________________ _____________________________________________________________________________________________________________________________________ _____________________________________________________________________________________________________________________________________  WHEN TO CALL YOUR DOCTOR: 1. Fever over 101.0 2. Nausea and/or vomiting. 3. Extreme swelling or bruising. 4. Continued bleeding from incision. 5. Increased pain, redness, or drainage from the incision.  The clinic staff is available to answer your questions during regular business hours.  Please don't hesitate to call and ask to speak to one of the nurses for clinical concerns.  If you have a medical  emergency, go to the nearest emergency room or call 911.  A surgeon from [facility] is always on call at the hospital.  For  further questions, please visit [email or web address]   Post Anesthesia Home Care Instructions  Activity: Get plenty of rest for the remainder of the day. A responsible individual must stay with you for 24 hours following the procedure.  For the next 24 hours, DO NOT: -Drive a car -Paediatric nurse -Drink alcoholic beverages -Take any medication unless instructed by your physician -Make any legal decisions or sign important papers.  Meals: Start with liquid foods such as gelatin or soup. Progress to regular foods as tolerated. Avoid greasy, spicy, heavy foods. If nausea and/or vomiting occur, drink only clear liquids until the nausea and/or vomiting subsides. Call your physician if vomiting continues.  Special Instructions/Symptoms: Your throat may feel dry or sore from the anesthesia or the breathing tube placed in your throat during surgery. If this causes discomfort, gargle with warm salt water. The discomfort should disappear within 24 hours.  If you had a scopolamine patch placed behind your ear for the management of post- operative nausea and/or vomiting:  1. The medication in the patch is effective for 72 hours, after which it should be removed.  Wrap patch in a tissue and discard in the trash. Wash hands thoroughly with soap and water. 2. You may remove the patch earlier than 72 hours if you experience unpleasant side effects which may include dry mouth, dizziness or visual disturbances. 3. Avoid touching the patch. Wash your hands with soap and water after contact with the patch.

## 2019-03-28 NOTE — Addendum Note (Signed)
Addendum  created 03/28/19 1208 by Nolon Nations, MD   Child order released for a procedure order, Clinical Note Signed, Intraprocedure Blocks edited

## 2019-03-28 NOTE — Anesthesia Preprocedure Evaluation (Signed)
Anesthesia Evaluation  Patient identified by MRN, date of birth, ID band Patient awake    Reviewed: Allergy & Precautions, NPO status , Patient's Chart, lab work & pertinent test results  History of Anesthesia Complications (+) PONV and history of anesthetic complications  Airway Mallampati: II  TM Distance: >3 FB Neck ROM: Full    Dental no notable dental hx. (+) Dental Advisory Given   Pulmonary asthma , former smoker,    Pulmonary exam normal breath sounds clear to auscultation       Cardiovascular hypertension, Normal cardiovascular exam Rhythm:Regular Rate:Normal     Neuro/Psych  Headaches, negative psych ROS   GI/Hepatic Neg liver ROS, GERD  ,  Endo/Other  diabetesHypothyroidism   Renal/GU negative Renal ROS     Musculoskeletal  (+) Arthritis ,   Abdominal   Peds negative pediatric ROS (+)  Hematology negative hematology ROS (+)   Anesthesia Other Findings   Reproductive/Obstetrics negative OB ROS                             Anesthesia Physical Anesthesia Plan  ASA: III  Anesthesia Plan: General   Post-op Pain Management: GA combined w/ Regional for post-op pain   Induction: Intravenous  PONV Risk Score and Plan: 4 or greater and Ondansetron, Dexamethasone, Midazolam and Treatment may vary due to age or medical condition  Airway Management Planned: LMA  Additional Equipment: None  Intra-op Plan:   Post-operative Plan: Extubation in OR  Informed Consent: I have reviewed the patients History and Physical, chart, labs and discussed the procedure including the risks, benefits and alternatives for the proposed anesthesia with the patient or authorized representative who has indicated his/her understanding and acceptance.     Dental advisory given  Plan Discussed with: CRNA  Anesthesia Plan Comments:         Anesthesia Quick Evaluation

## 2019-03-29 ENCOUNTER — Encounter: Payer: Self-pay | Admitting: *Deleted

## 2019-03-29 LAB — SURGICAL PATHOLOGY

## 2019-03-30 NOTE — Progress Notes (Signed)
Office Visit Note  Patient: Cheryl Knight             Date of Birth: 1955-09-10           MRN: [id number]             PCP: McLean-Scocuzza, Nino Glow, MD Referring: Jodelle Green, FNP Visit Date: 04/05/2019 Occupation: @GUAROCC @  Subjective:  Pain in both knees   History of Present Illness: Cheryl Knight is a 64 y.o. female with history of rheumatoid arthritis, osteoarthritis.  She states she is not having any joint swelling.  She continues to have pain and discomfort in her bilateral knee joints.  She is having difficulty walking due to knee joint discomfort.  She was diagnosed with breast cancer in mid February.  She had right breast lumpectomy.  And 2 lymph nodes were removed.  She will be starting radiation therapy in April.  She is currently on methotrexate which she will have to stop.  She takes tramadol 1 tablet p.o. twice daily as needed for pain management.  She has taken prednisone recently for sinus infection.  She continues to have sicca symptoms.  She had recent cataract surgery in her vision has improved a lot.  She denies having any psoriasis lesions.  Activities of Daily Living:  Patient reports morning stiffness for 10 minutes.   Patient Reports nocturnal pain.  Difficulty dressing/grooming: Denies Difficulty climbing stairs: Reports Difficulty getting out of chair: Reports Difficulty using hands for taps, buttons, cutlery, and/or writing: Reports  Review of Systems  Constitutional: Positive for fatigue.  HENT: Positive for mouth dryness and nose dryness. Negative for mouth sores.   Eyes: Positive for dryness.  Respiratory: Positive for cough. Negative for shortness of breath and difficulty breathing.   Cardiovascular: Negative for chest pain and palpitations.  Gastrointestinal: Negative for blood in stool, constipation and diarrhea.  Endocrine: Negative for increased urination.  Genitourinary: Negative for difficulty urinating and painful urination.    Musculoskeletal: Positive for arthralgias, joint pain, joint swelling and morning stiffness.  Skin: Negative for rash and hair loss.  Allergic/Immunologic: Negative for susceptible to infections.  Neurological: Positive for weakness. Negative for dizziness, numbness, headaches and memory loss.  Hematological: Negative for bruising/bleeding tendency.  Psychiatric/Behavioral: Negative for confusion.    PMFS History:  Patient Active Problem List   Diagnosis Date Noted  . Family history of breast cancer   . Family history of prostate cancer   . Family history of skin cancer   . Family history of lung cancer   . Malignant neoplasm of lower-outer quadrant of right breast of female, estrogen receptor positive ([place]) 03/19/2019  . Type 2 diabetes mellitus without complication, without long-term current use of insulin ([place]) 02/08/2019  . Allergic rhinitis 02/08/2019  . Osteoarthritis   . Primary osteoarthritis involving multiple joints 11/15/2017  . Moderate asthma with acute exacerbation 05/03/2017  . Routine general medical examination at a health care facility 02/08/2017  . Primary osteoarthritis of both feet 02/10/2016  . Hx of migraine headaches 02/07/2016  . History of hypothyroidism 02/07/2016  . History of gastroesophageal reflux (GERD) 02/07/2016  . Obese 12/30/2015  . Basal cell carcinoma of chest wall 10/05/2011  . Hypertension 09/01/2010  . Hyperlipidemia 02/27/2008  . Hypothyroidism 02/21/2007  . Migraine 02/21/2007  . VAGINITIS, ATROPHIC 02/21/2007  . Psoriasis 02/21/2007  . Sicca syndrome, unspecified ([place]) 02/21/2007  . GERD 06/29/2006  . Rheumatoid arthritis ([place]) 06/29/2006    Past Medical History:  Diagnosis Date  .  Cancer ([place]) 02/2019   right breast IDC  . Cataracts, bilateral    tbd surgery 2021  . Complication of anesthesia   . Cough   . Family history of breast cancer   . Family history of lung cancer   . Family history of prostate cancer   . Family  history of skin cancer   . GERD (gastroesophageal reflux disease)   . Headache   . High cholesterol   . Hypertension   . Hypothyroidism   . Irritable bowel   . Osteoarthritis   . Osteoarthritis    i.e right knee   . PONV (postoperative nausea and vomiting)   . Pre-diabetes   . Rheumatoid arthritis ([place])   . Thyroid disease     Family History  Problem Relation Age of Onset  . Hypertension Mother   . Thyroid disease Mother   . Breast cancer Mother 30       breast dx age 2 died age 3   . Prostate cancer Father 44       metastatic  . Stroke Maternal Grandmother   . Skin cancer Maternal Uncle        dx. in his 47s  . Lung cancer Maternal Uncle        smoker, died age 56   Past Surgical History:  Procedure Laterality Date  . ABDOMINAL HYSTERECTOMY     age 56 ovaries out  . BREAST LUMPECTOMY WITH RADIOACTIVE SEED AND SENTINEL LYMPH NODE BIOPSY Right 03/28/2019   Procedure: RIGHT BREAST LUMPECTOMY WITH RADIOACTIVE SEED AND SENTINEL LYMPH NODE BIOPSY;  Surgeon: Donnie Mesa, MD;  Location: [facility];  Service: General;  Laterality: Right;  PEC BLOCK FOR POST OP PAIN  . CATARACT EXTRACTION, BILATERAL    . ESOPHAGEAL DILATION    . TONSILLECTOMY    . 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 H1N1 02/27/2008  . Influenza-Unspecified 11/18/2017  . Td 01/19/2004  . Tdap 12/17/2014     Objective: Vital Signs: BP 131/77 (BP Location: Left Arm, Patient Position: Sitting, Cuff Size: Normal)   Pulse 94   Resp 15   Ht 5\' 8"  (1.727 m)   Wt 218 lb (98.9 kg)   BMI 33.15 kg/m    Physical Exam Vitals and nursing note reviewed.  Constitutional:      Appearance: She is well-developed.  HENT:     Head: Normocephalic and atraumatic.  Eyes:     Conjunctiva/sclera: Conjunctivae normal.  Cardiovascular:     Rate and Rhythm: Normal rate  and regular rhythm.     Heart sounds: Normal heart sounds.  Pulmonary:     Effort: Pulmonary effort is normal.     Breath sounds: Normal breath sounds.  Abdominal:     General: Bowel sounds are normal.     Palpations: Abdomen is soft.  Musculoskeletal:     Cervical back: Normal range of motion.  Lymphadenopathy:     Cervical: No cervical adenopathy.  Skin:    General: Skin is warm and dry.     Capillary Refill: Capillary refill takes less than 2 seconds.  Neurological:     Mental Status: She is alert and oriented to person, place, and time.  Psychiatric:        Behavior: Behavior normal.      Musculoskeletal Exam: C-spine thoracic and  lumbar spine with good range of motion.  Shoulder joints and elbow joints in good range of motion.  She is some tenderness over MCPs but no synovitis was noted.  Hip joints, knee joints, ankles, MTPs and PIPs were in good range of motion with no synovitis.  CDAI Exam: CDAI Score: 0.8  Patient Global: 4 mm; Provider Global: 4 mm Swollen: 0 ; Tender: 0  Joint Exam 04/05/2019   No joint exam has been documented for this visit   There is currently no information documented on the homunculus. Go to the Rheumatology activity and complete the homunculus joint exam.  Investigation: No additional findings.  Imaging: NM Sentinel Node Inj-No Rpt (Breast)  Result Date: 03/28/2019 Sulfur colloid was injected by the nuclear medicine technologist for melanoma sentinel node.    Recent Labs: Lab Results  Component Value Date   WBC 6.6 03/21/2019   HGB 14.4 03/21/2019   PLT 303 03/21/2019   NA 143 03/21/2019   K 3.4 (L) 03/21/2019   CL 108 03/21/2019   CO2 26 03/21/2019   GLUCOSE 168 (H) 03/21/2019   BUN 13 03/21/2019   CREATININE 0.78 03/21/2019   BILITOT 0.3 03/21/2019   ALKPHOS 61 03/21/2019   AST 19 03/21/2019   ALT 12 03/21/2019   PROT 7.4 03/21/2019   ALBUMIN 4.1 03/21/2019   CALCIUM 9.5 03/21/2019   GFRAA >60 03/21/2019     Speciality Comments: No specialty comments available.  Procedures:  No procedures performed Allergies: Lisinopril, Meperidine hcl, and Pentazocine lactate   Assessment / Plan:     Visit Diagnoses: Rheumatoid arthritis involving multiple sites with positive rheumatoid factor (HCC) - +RF.  Patient states she had a small flare after stopping methotrexate during the time of her lumpectomy.  She had recent diagnosis of breast cancer.  She will be receiving radiation therapy next months.  She will have to come off methotrexate.  We discussed switching from methotrexate to [place].  Indications side effects contraindications were discussed at length.  Patient is agreeable to proceed with [place].  We will start her on Arava 20 mg p.o. daily and check labs in 2 weeks, 2 months and then every 3 months.  High risk medication use - Methotrexate 0.8 ml every 7 days and folic acid 1 mg 2 tablets daily.  Her labs have been stable.  We will discontinue methotrexate.  Therapeutic drug monitoring - tramadol 50 mg 1 tablet in the morning and 1 tablet at night. UDS: 12/21/2018, narc agreement: 05/22/2018.  She takes 1 tablet twice a day for pain management on as needed basis.  Psoriasis-she had a mild psoriasis flare as she came off methotrexate.  Primary osteoarthritis of both knees-she has been having pain and discomfort in her bilateral knee joints.  I reviewed x-rays from 2018 which showed bilateral moderate to severe osteoarthritis.  She may benefit from [place] supplement injections in the future.  She is not ready for it at this point.  Primary osteoarthritis of both feet-she had no synovitis on examination.  Sicca syndrome ([place]) -she continues to have sicca symptoms.  She has been on pilocarpine 5 mg 1 tablet by mouth TID PRN for symptomatic relief.   History of hyperlipidemia  History of migraine  History of gastroesophageal reflux (GERD)  History of hypothyroidism  Malignant neoplasm of lower-outer  quadrant of right breast of female, estrogen receptor positive ([place])  Orders: Orders Placed This Encounter  Procedures  . QuantiFERON-TB Gold Plus  . Serum protein electrophoresis with reflex  .  IgG, IgA, IgM  . Hepatitis C antibody  . Hepatitis B core antibody, IgM   No orders of the defined types were placed in this encounter.   Face-to-face time spent with patient was 30 minutes. Greater than 50% of time was spent in counseling and coordination of care.  Follow-Up Instructions: Return in about 3 months (around 07/06/2019) for Rheumatoid arthritis, Osteoarthritis.   Bo Merino, MD  Note - This record has been created using Editor, commissioning.  Chart creation errors have been sought, but may not always  have been located. Such creation errors do not reflect on  the standard of medical care.

## 2019-04-02 ENCOUNTER — Encounter: Payer: Self-pay | Admitting: *Deleted

## 2019-04-02 ENCOUNTER — Encounter: Payer: Self-pay | Admitting: Internal Medicine

## 2019-04-02 DIAGNOSIS — Z170 Estrogen receptor positive status [ER+]: Secondary | ICD-10-CM

## 2019-04-02 DIAGNOSIS — C50511 Malignant neoplasm of lower-outer quadrant of right female breast: Secondary | ICD-10-CM

## 2019-04-03 ENCOUNTER — Encounter: Payer: Self-pay | Admitting: Genetic Counselor

## 2019-04-03 ENCOUNTER — Ambulatory Visit (HOSPITAL_BASED_OUTPATIENT_CLINIC_OR_DEPARTMENT_OTHER): Payer: PPO | Admitting: Genetic Counselor

## 2019-04-03 DIAGNOSIS — Z801 Family history of malignant neoplasm of trachea, bronchus and lung: Secondary | ICD-10-CM | POA: Insufficient documentation

## 2019-04-03 DIAGNOSIS — Z8042 Family history of malignant neoplasm of prostate: Secondary | ICD-10-CM | POA: Insufficient documentation

## 2019-04-03 DIAGNOSIS — Z170 Estrogen receptor positive status [ER+]: Secondary | ICD-10-CM

## 2019-04-03 DIAGNOSIS — C44519 Basal cell carcinoma of skin of other part of trunk: Secondary | ICD-10-CM

## 2019-04-03 DIAGNOSIS — Z803 Family history of malignant neoplasm of breast: Secondary | ICD-10-CM | POA: Diagnosis not present

## 2019-04-03 DIAGNOSIS — C50511 Malignant neoplasm of lower-outer quadrant of right female breast: Secondary | ICD-10-CM | POA: Diagnosis not present

## 2019-04-03 DIAGNOSIS — Z808 Family history of malignant neoplasm of other organs or systems: Secondary | ICD-10-CM | POA: Insufficient documentation

## 2019-04-03 NOTE — Progress Notes (Signed)
REFERRING PROVIDER: Truitt Merle, MD [address] [place],  [place] [address]  PRIMARY PROVIDER:  McLean-Scocuzza, Nino Glow, MD  PRIMARY REASON FOR VISIT:  1. Malignant neoplasm of lower-outer quadrant of right breast of female, estrogen receptor positive ([place])   2. Family history of prostate cancer   3. Family history of breast cancer   4. Basal cell carcinoma of chest wall   5. Family history of skin cancer   6. Family history of lung cancer      I connected with [name] on 04/03/2019 at 1:00 pm EDT by MyChart video conference and verified that I am speaking with the correct person using two identifiers.   Patient location: home Provider location: [facility] office  HISTORY OF PRESENT ILLNESS:   [name], a 64 y.o. female, was seen for a [place] cancer genetics consultation at the request of Dr. Burr Medico due to a personal and family history of breast cancer and a family history of metastatic prostate cancer.  [name] presents to clinic today to discuss the possibility of a hereditary predisposition to cancer, genetic testing, and to further clarify her future cancer risks, as well as potential cancer risks for family members.   In 2021, at the age of 60, [name] was diagnosed with invasive ductal carcinoma, ER+/PR+/Her2-, of the right breast. The treatment plan includes surgery (lumpectomy performed 03/28/19), oncotype if indicated, radiation therapy if indicated, and antiestrogen therapy.   CANCER HISTORY:  Oncology History Overview Note  Cancer Staging Malignant neoplasm of lower-outer quadrant of right breast of female, estrogen receptor positive ([place]) Staging form: Breast, AJCC 8th Edition - Clinical stage from 03/14/2019: Stage IA (cT1a, cN0, cM0, G1, ER+, PR+, HER2-) - Signed by Truitt Merle, MD on 03/20/2019    Malignant neoplasm of lower-outer quadrant of right breast of female, estrogen receptor positive ([place])  02/22/2019 Imaging   Bone Density Scan  02/22/19  DEXA shows osteopenia with lowest T-score -1.4 at left hip   03/09/2019 Mammogram   Diagnostic Mammogram 03/09/19 IMPRESSION the 70m mass in 7:00 posiiton of right breast suspicous of malignancy.     03/14/2019 Cancer Staging   Staging form: Breast, AJCC 8th Edition - Clinical stage from 03/14/2019: Stage IA (cT1a, cN0, cM0, G1, ER+, PR+, HER2-) - Signed by [name] MD on 03/20/2019   03/14/2019 Initial Biopsy   Diagnosis 03/14/19  Breast, right, needle core biopsy, 7 o'clock, 5cmfn - INVASIVE DUCTAL CARCINOMA. SEE NOTE Diagnosis Note Carcinoma measures 0.4 cm in greatest linear dimension and appears grade 1. Dr. CJeannie Donereviewed the case and concurs with the diagnosis. A breast prognostic profile (ER, PR, Ki-67 and HER2) is pending and will be reported in an addendum. Dr. BIsaiah Blakeswas notified on 11/12/2019.   03/14/2019 Receptors her2   PROGNOSTIC INDICATORS Results: IMMUNOHISTOCHEMICAL AND MORPHOMETRIC ANALYSIS PERFORMED MANUALLY The tumor cells are EQUIVOCAL for Her2 (2+). Her2 by FISH will be performed and results reported separately. Estrogen Receptor: 95%, POSITIVE, STRONG STAINING INTENSITY Progesterone Receptor: 90%, POSITIVE, MODERATE STAINING INTENSITY Proliferation Marker Ki67: 10%   03/19/2019 Initial Diagnosis   Malignant neoplasm of lower-outer quadrant of right breast of female, estrogen receptor positive ([place]      RISK FACTORS:  Menarche was at age 64  First live birth at age 64  OCP use for approximately 26 years.  Ovaries intact: no.  Hysterectomy: yes.  Menopausal status: postmenopausal.  HRT use: 0 years. Colonoscopy: yes; normal. Mammogram within the last year: yes. Number of breast biopsies: 1.  Any excessive radiation exposure in the past: no  Past Medical History:  Diagnosis Date  . Cancer ([place]) 02/2019   right breast IDC  . Cataracts, bilateral    tbd surgery 2021  . Complication of anesthesia   . Cough   . Family history of breast cancer    . Family history of lung cancer   . Family history of prostate cancer   . Family history of skin cancer   . GERD (gastroesophageal reflux disease)   . Headache   . High cholesterol   . Hypertension   . Hypothyroidism   . Irritable bowel   . Osteoarthritis   . Osteoarthritis    i.e right knee   . PONV (postoperative nausea and vomiting)   . Pre-diabetes   . Rheumatoid arthritis ([place])   . Thyroid disease     Past Surgical History:  Procedure Laterality Date  . ABDOMINAL HYSTERECTOMY     age 46 ovaries out  . BREAST LUMPECTOMY WITH RADIOACTIVE SEED AND SENTINEL LYMPH NODE BIOPSY Right 03/28/2019   Procedure: RIGHT BREAST LUMPECTOMY WITH RADIOACTIVE SEED AND SENTINEL LYMPH NODE BIOPSY;  Surgeon: Donnie Mesa, MD;  Location: [facility];  Service: General;  Laterality: Right;  PEC BLOCK FOR POST OP PAIN  . ESOPHAGEAL DILATION    . TONSILLECTOMY    . TUBAL LIGATION      Social History   Socioeconomic History  . Marital status: Married    Spouse name: Not on file  . Number of children: 2  . Years of education: Not on file  . Highest education level: Not on file  Occupational History  . Occupation: nurse  Tobacco Use  . Smoking status: Former Smoker    Packs/day: 0.25    Years: 7.00    Pack years: 1.75    Types: Cigarettes    Quit date: 2000    Years since quitting: 21.2  . Smokeless tobacco: Never Used  Substance and Sexual Activity  . Alcohol use: Yes    Comment: socially   . Drug use: Never  . Sexual activity: Yes  Other Topics Concern  . 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 Difficulty of Paying Living Expenses:   Food Insecurity:   . Worried About Charity fundraiser in the Last Year:   . Arboriculturist in the Last Year:   Transportation Needs:   . Film/video editor (Medical):   Marland Kitchen Lack of Transportation (Non-Medical):    Physical Activity:   . Days of Exercise per Week:   . Minutes of Exercise per Session:   Stress:   . Feeling of Stress :   Social Connections:   . Frequency of Communication with Friends and Family:   . Frequency of Social Gatherings with Friends and Family:   . Attends Religious Services:   . Active Member of Clubs or Organizations:   . Attends Archivist Meetings:   Marland Kitchen Marital Status:      FAMILY HISTORY:  We obtained a detailed, 4-generation family history.  Significant diagnoses are listed below: Family History  Problem Relation Age of Onset  . Hypertension Mother   . Thyroid disease Mother   . Breast cancer Mother 58       breast dx age 61 died age 58   . Prostate cancer  Father 75       metastatic  . Stroke Maternal Grandmother   . Skin cancer Maternal Uncle        dx. in his 51s  . Lung cancer Maternal Uncle        smoker, died age 63   Ms. Moorehouse has two sons (ages 32 and 2), and four grandsons. She also has two sisters (ages 49 and 5). None of these family members have had cancer.  Ms. Loree mother died at age 31 and had a history of breast cancer diagnosed at age 35 and basal cell carcinoma when she was 22. Ms. Wynn had three maternal uncles. One uncle is living at age 55, one uncle died at age 53 and had a history of skin cancer in his 37s, and the third uncle died at age 81 from lung cancer and was a smoker. One of her maternal cousins died from melanoma when he was 27. Ms. Flicker maternal grandmother died in her 42s from a stroke after having multiple strokes, and her maternal grandfather died at age 91 in his sleep.  Ms. Solem father died at age 61 from metastatic prostate cancer that was initially diagnosed at age 52. Her father was adopted, and so Ms. Salvucci has limited information about her paternal side of the family. She knows that her father had two sisters, one who died at age 53 and one who died at an unknown age, but when she  was elderly. Her paternal grandmother likely died when she was older than 23 from either a heart attack or a stroke. She does not have any information about her paternal grandfather.  Ms. Hollon is unaware of previous family history of genetic testing for hereditary cancer risks. She does not know her ancestry. There is no reported Ashkenazi Jewish ancestry. There is no known consanguinity.  GENETIC COUNSELING ASSESSMENT: Ms. Santini is a 64 y.o. female with a personal history of breast cancer and a family history of metastatic prostate cancer, which is somewhat suggestive of a hereditary cancer syndrome and predisposition to cancer. We, therefore, discussed and recommended the following at today's visit.   DISCUSSION: We discussed that 5-10% of breast cancer is hereditary, with most cases associated with the BRCA1 and BRCA2 genes.  There are other genes that can be associated with hereditary breast cancer syndromes.  These include ATM, CHEK2, PALB2, etc.  We discussed that testing is beneficial for several reasons including knowing about other cancer risks, identifying potential screening and risk-reduction options that may be appropriate, and to understand if other family members could be at risk for cancer and allow them to undergo genetic testing.  We reviewed the characteristics, features and inheritance patterns of hereditary cancer syndromes. We also discussed genetic testing, including the appropriate family members to test, the process of testing, insurance coverage and turn-around-time for results. We discussed the implications of a negative, positive and/or variant of uncertain significant result. We recommended Ms. Scinto pursue genetic testing for the Common Hereditary Cancers panel.   The Common Hereditary Cancers Panel offered by [facility] includes sequencing and/or deletion duplication testing of the following 48 genes: APC, ATM, AXIN2, BARD1, BMPR1A, BRCA1, BRCA2, BRIP1, CDH1, CDK4,  CDKN2A (p14ARF), CDKN2A (p16INK4a), CHEK2, CTNNA1, DICER1, EPCAM (Deletion/duplication testing only), GREM1 (promoter region deletion/duplication testing only), KIT, MEN1, MLH1, MSH2, MSH3, MSH6, MUTYH, NBN, NF1, NHTL1, PALB2, PDGFRA, PMS2, POLD1, POLE, PTEN, RAD50, RAD51C, RAD51D, RNF43, SDHB, SDHC, SDHD, SMAD4, SMARCA4. STK11, TP53, TSC1, TSC2, and VHL.  The following  genes are evaluated for sequence changes only: SDHA and HOXB13 c.251G>A variant only.   Based on Ms. River's personal and family history of cancer, she meets medical criteria for genetic testing. Despite that she meets criteria, she may still have an out of pocket cost. We discussed that if her out of pocket cost for testing is over $100, the laboratory will call and confirm whether she wants to proceed with testing.  If the out of pocket cost of testing is less than $100 she will be billed by the genetic testing laboratory.   PLAN: After considering the risks, benefits, and limitations, Ms. Bauers provided informed consent to pursue genetic testing and the saliva sample will be sent to [facility] for analysis of the Common Hereditary Cancers panel. Results should be available within approximately two-three weeks' time, at which point they will be disclosed by telephone to Ms. Salemi, as will any additional recommendations warranted by these results. Ms. Dave will receive a summary of her genetic counseling visit and a copy of her results once available. This information will also be available in Epic.   Ms. Fehring questions were answered to her satisfaction today. Our contact information was provided should additional questions or concerns arise. Thank you for the referral and allowing [place] to share in the care of your patient.   Clint Guy, MS, [facility] Genetic Counselor [email or web address] Phone: [phone]  The patient was seen for a total of 40 minutes in face-to-face genetic counseling.  This  patient was discussed with Drs. Magrinat, Lindi Adie and/or Burr Medico who agrees with the above.    _______________________________________________________________________ For Office Staff:  Number of people involved in session: 1 Was an Intern/ student involved with case: no

## 2019-04-04 ENCOUNTER — Encounter: Payer: Self-pay | Admitting: *Deleted

## 2019-04-04 ENCOUNTER — Telehealth: Payer: Self-pay

## 2019-04-04 NOTE — Telephone Encounter (Signed)
Nutrition Assessment  Reason for Assessment:  Pt attended Breast Clinic on 3/3 2021 and was given nutrition packet by nurse navigator  ASSESSMENT:  64 year old female with new diagnosis of breast cancer.  S/p right lumpectomy on 3/10.  Past medical history reviewed.   Spoke with patient via phone to introduce self and service at [facility].  Patient reports she currently does not have any questions regarding nutrition.   Medications:  reviewed  Labs: reviewed  Anthropometrics:   Height: 68 inches Weight: 213 lb BMI: 32   NUTRITION DIAGNOSIS: Food and nutrition related knowledge deficit related to new diagnosis of breast cancer as evidenced by no prior need for nutrition related information.  INTERVENTION:   Discussed briefly packet of information regarding nutritional tips for breast cancer patients.  No questions at this time.  Contact information provided and patient knows to contact me with questions/concerns.    MONITORING, EVALUATION, and GOAL: Pt will consume a healthy plant based diet to maintain lean body mass throughout treatment.   Ailany Koren B. Zenia Resides, [place], [place] Registered Dietitian [phone] (pager)

## 2019-04-05 ENCOUNTER — Other Ambulatory Visit: Payer: Self-pay | Admitting: *Deleted

## 2019-04-05 ENCOUNTER — Other Ambulatory Visit: Payer: Self-pay

## 2019-04-05 ENCOUNTER — Encounter: Payer: Self-pay | Admitting: Physician Assistant

## 2019-04-05 ENCOUNTER — Ambulatory Visit: Payer: PPO | Admitting: Rheumatology

## 2019-04-05 ENCOUNTER — Telehealth: Payer: Self-pay | Admitting: Rheumatology

## 2019-04-05 VITALS — BP 131/77 | HR 94 | Resp 15 | Ht 68.0 in | Wt 218.0 lb

## 2019-04-05 DIAGNOSIS — M0579 Rheumatoid arthritis with rheumatoid factor of multiple sites without organ or systems involvement: Secondary | ICD-10-CM

## 2019-04-05 DIAGNOSIS — Z8639 Personal history of other endocrine, nutritional and metabolic disease: Secondary | ICD-10-CM

## 2019-04-05 DIAGNOSIS — Z79899 Other long term (current) drug therapy: Secondary | ICD-10-CM

## 2019-04-05 DIAGNOSIS — M3500 Sicca syndrome, unspecified: Secondary | ICD-10-CM | POA: Diagnosis not present

## 2019-04-05 DIAGNOSIS — M170 Bilateral primary osteoarthritis of knee: Secondary | ICD-10-CM

## 2019-04-05 DIAGNOSIS — Z8719 Personal history of other diseases of the digestive system: Secondary | ICD-10-CM

## 2019-04-05 DIAGNOSIS — M19072 Primary osteoarthritis, left ankle and foot: Secondary | ICD-10-CM | POA: Diagnosis not present

## 2019-04-05 DIAGNOSIS — Z5181 Encounter for therapeutic drug level monitoring: Secondary | ICD-10-CM | POA: Diagnosis not present

## 2019-04-05 DIAGNOSIS — M19071 Primary osteoarthritis, right ankle and foot: Secondary | ICD-10-CM | POA: Diagnosis not present

## 2019-04-05 DIAGNOSIS — Z170 Estrogen receptor positive status [ER+]: Secondary | ICD-10-CM

## 2019-04-05 DIAGNOSIS — Z8669 Personal history of other diseases of the nervous system and sense organs: Secondary | ICD-10-CM | POA: Diagnosis not present

## 2019-04-05 DIAGNOSIS — L409 Psoriasis, unspecified: Secondary | ICD-10-CM | POA: Diagnosis not present

## 2019-04-05 DIAGNOSIS — C50511 Malignant neoplasm of lower-outer quadrant of right female breast: Secondary | ICD-10-CM

## 2019-04-05 MED ORDER — TRAMADOL HCL 50 MG PO TABS: ORAL_TABLET | ORAL | 0 refills | Status: DC

## 2019-04-05 NOTE — Telephone Encounter (Signed)
Spoke with Yves Dill and she states the labs to be performed include a CBC and CMP. Will be able to view them in Epic once resulted.

## 2019-04-05 NOTE — Patient Instructions (Addendum)
   STOP methotrexate and folic acid  START Arava 20 mg daily.  A prescription will be sent to the pharmacy pending lab results.  Standing Labs We placed an order today for your standing lab work.    Please come back and get your standing labs in 2 weeks, 2 months and then every 3 months.  We have open lab daily Monday through Thursday from 8:30-12:30 PM and 1:30-4:30 PM and Friday from 8:30-12:30 PM and 1:30-4:00 PM at the office of Dr. Bo Merino.   You may experience shorter wait times on Monday and Friday afternoons. The office is located at [address], [place], [place], [place] [address] No appointment is necessary.   Labs are drawn by [facility].  You may receive a bill from [facility] for your lab work.  If you wish to have your labs drawn at another location, please call the office 24 hours in advance to send orders.  If you have any questions regarding directions or hours of operation,  please call [phone].   Just as a reminder please drink plenty of water prior to coming for your lab work. Thanks!

## 2019-04-05 NOTE — Telephone Encounter (Signed)
Patient called stating she spoke with her PCP Dr. Terese Door office and they are willing to do Dr. Tempie Hoist at the same time she has her fasting blood sugar test at their office in 2 weeks.  Patient states the orders need to say "clinic collect" and to contact her assistant Arianna at [phone]

## 2019-04-05 NOTE — Telephone Encounter (Signed)
Refill request received via fax  Last Visit: 04/05/19 Next Visit: 07/10/19 UDS: 12/21/18 Narc Agreement: 05/22/18  Patient was given a Hydrocodone prescription on 03/28/19 due to Lumpectomy surgery which was performed.   Last Fill: 02/09/19  Okay to refill Tramadol?

## 2019-04-05 NOTE — Progress Notes (Signed)
Pharmacy Note  Subjective: Patient presents today to the [facility] for follow up office visit.  Patient seen by the pharmacist for counseling on leflunomide Jolee Ewing) for rheumatoid arthritis.  She is currently on methotrexate.  She was recently diagnosed with breast cancer and must discontinue methotrexate with radiation therapy.  Objective: CBC    Component Value Date/Time   WBC 6.6 03/21/2019 1213   WBC 7.2 09/13/2018 1538   RBC 4.58 03/21/2019 1213   HGB 14.4 03/21/2019 1213   HGB 14.3 12/21/2018 1014   HCT 44.0 03/21/2019 1213   HCT 42.3 12/21/2018 1014   PLT 303 03/21/2019 1213   PLT 325 12/21/2018 1014   MCV 96.1 03/21/2019 1213   MCV 92 12/21/2018 1014   MCH 31.4 03/21/2019 1213   MCHC 32.7 03/21/2019 1213   RDW 13.7 03/21/2019 1213   RDW 13.2 12/21/2018 1014   LYMPHSABS 3.2 03/21/2019 1213   LYMPHSABS 3.4 (H) 12/21/2018 1014   MONOABS 0.5 03/21/2019 1213   EOSABS 0.2 03/21/2019 1213   EOSABS 0.2 12/21/2018 1014   BASOSABS 0.1 03/21/2019 1213   BASOSABS 0.1 12/21/2018 1014    CMP     Component Value Date/Time   NA 143 03/21/2019 1213   NA 144 12/21/2018 1014   K 3.4 (L) 03/21/2019 1213   CL 108 03/21/2019 1213   CO2 26 03/21/2019 1213   GLUCOSE 168 (H) 03/21/2019 1213   GLUCOSE 87 01/20/2006 1243   BUN 13 03/21/2019 1213   BUN 17 12/21/2018 1014   CREATININE 0.78 03/21/2019 1213   CREATININE 0.67 09/13/2018 1538   CALCIUM 9.5 03/21/2019 1213   PROT 7.4 03/21/2019 1213   PROT 6.9 12/21/2018 1014   ALBUMIN 4.1 03/21/2019 1213   ALBUMIN 4.6 12/21/2018 1014   AST 19 03/21/2019 1213   ALT 12 03/21/2019 1213   ALKPHOS 61 03/21/2019 1213   BILITOT 0.3 03/21/2019 1213   GFRNONAA >60 03/21/2019 1213   GFRNONAA 92 09/13/2018 1538   GFRAA >60 03/21/2019 1213   GFRAA 107 09/13/2018 1538    Baseline Immunosuppressant Therapy Labs TB gold Pending 04/05/2019  HIV Lab Results  Component Value Date   HIV Non Reactive 02/14/2018   Hepatitis  panel: Pending 04/05/2019  Immunoglobulins Pending 04/05/2019  SPEP Pending 04/05/2019  No results found for: G6PDH  No results found for: TPMT   Pregnancy status:  hysterectomy  Assessment/Plan:  Patient was counseled on the purpose, proper use, and adverse effects of leflunomide including risk of infection, nausea/diarrhea/weight loss, increase in blood pressure, rash, hair loss, tingling in the hands and feet, and signs and symptoms of interstitial lung disease.   Also counseled on Black Box warning of liver injury and importance of avoiding alcohol while on therapy. Discussed that there is the possibility of an increased risk of malignancy but it is not well understood if this increased risk is due to the medication or the disease state.  Counseled patient to avoid live vaccines. Recommend annual influenza, Pneumovax 23, Prevnar 13, and Shingrix as indicated.   Discussed the importance of frequent monitoring of liver function and blood count.  Standing orders placed.  Discussed importance of birth control while on leflunomide due to risk of congenital abnormalities, and patient confirms hysterectomy.  Provided patient with educational materials on leflunomide and answered all questions.  Patient consented to [place] use, and consent will be uploaded into the media tab.    Patient dose will be Arava 20 mg.  Prescription pending lab results and/or  insurance approval.  All questions encouraged and answered.  Instructed patient to reach out with any other questions or concerns.  Mariella Saa, PharmD, [place], [place] Clinical Specialty Pharmacist [phone]  04/05/2019 10:37 AM

## 2019-04-07 ENCOUNTER — Other Ambulatory Visit: Payer: Self-pay | Admitting: Rheumatology

## 2019-04-09 LAB — HEPATITIS C ANTIBODY
Hepatitis C Ab: NONREACTIVE
SIGNAL TO CUT-OFF: 0.01 (ref <1.00)

## 2019-04-09 LAB — QUANTIFERON-TB GOLD PLUS
Mitogen-NIL: 6.62 IU/mL
NIL: 0.05 IU/mL
QuantiFERON-TB Gold Plus: NEGATIVE
TB1-NIL: 0 IU/mL
TB2-NIL: 0 IU/mL

## 2019-04-09 LAB — IGG, IGA, IGM
IgG (Immunoglobin G), Serum: 932 mg/dL (ref 600–1540)
IgM, Serum: 80 mg/dL (ref 50–300)
Immunoglobulin A: 142 mg/dL (ref 70–320)

## 2019-04-09 LAB — PROTEIN ELECTROPHORESIS, SERUM, WITH REFLEX
Albumin ELP: 4.2 g/dL (ref 3.8–4.8)
Alpha 1: 0.3 g/dL (ref 0.2–0.3)
Alpha 2: 0.8 g/dL (ref 0.5–0.9)
Beta 2: 0.4 g/dL (ref 0.2–0.5)
Beta Globulin: 0.5 g/dL (ref 0.4–0.6)
Gamma Globulin: 0.8 g/dL (ref 0.8–1.7)
Total Protein: 6.9 g/dL (ref 6.1–8.1)

## 2019-04-09 LAB — HEPATITIS B CORE ANTIBODY, IGM: Hep B C IgM: NONREACTIVE

## 2019-04-09 NOTE — Telephone Encounter (Signed)
Last Visit: 04/05/19 Next Visit: 07/10/19  Okay to refill per Dr. Estanislado Pandy

## 2019-04-10 ENCOUNTER — Other Ambulatory Visit: Payer: Self-pay | Admitting: Pharmacist

## 2019-04-10 DIAGNOSIS — M0579 Rheumatoid arthritis with rheumatoid factor of multiple sites without organ or systems involvement: Secondary | ICD-10-CM

## 2019-04-10 DIAGNOSIS — C50511 Malignant neoplasm of lower-outer quadrant of right female breast: Secondary | ICD-10-CM | POA: Diagnosis not present

## 2019-04-10 DIAGNOSIS — Z803 Family history of malignant neoplasm of breast: Secondary | ICD-10-CM | POA: Diagnosis not present

## 2019-04-10 DIAGNOSIS — Z8042 Family history of malignant neoplasm of prostate: Secondary | ICD-10-CM | POA: Diagnosis not present

## 2019-04-10 MED ORDER — LEFLUNOMIDE 20 MG PO TABS: 20.0000 mg | ORAL_TABLET | Freq: Every day | ORAL | 2 refills | Status: DC

## 2019-04-10 NOTE — Progress Notes (Signed)
Labs resulted and were within normal limits.  Called patient to notify that she can start [place] based off of her lab results.  Patient requested prescription be sent to [facility].   Mariella Saa, PharmD, [place], [place] Clinical Specialty Pharmacist [phone]  04/10/2019 10:11 AM

## 2019-04-11 ENCOUNTER — Other Ambulatory Visit: Payer: Self-pay

## 2019-04-12 ENCOUNTER — Ambulatory Visit
Admission: RE | Admit: 2019-04-12 | Discharge: 2019-04-12 | Disposition: A | Discharge status: Home / Self Care | Payer: PPO | Source: Ambulatory Visit | Attending: Hematology | Admitting: Hematology

## 2019-04-12 DIAGNOSIS — Z170 Estrogen receptor positive status [ER+]: Secondary | ICD-10-CM

## 2019-04-12 DIAGNOSIS — C50511 Malignant neoplasm of lower-outer quadrant of right female breast: Secondary | ICD-10-CM | POA: Diagnosis not present

## 2019-04-12 DIAGNOSIS — Z803 Family history of malignant neoplasm of breast: Secondary | ICD-10-CM | POA: Diagnosis not present

## 2019-04-12 DIAGNOSIS — Z9889 Other specified postprocedural states: Secondary | ICD-10-CM | POA: Diagnosis not present

## 2019-04-12 DIAGNOSIS — M069 Rheumatoid arthritis, unspecified: Secondary | ICD-10-CM | POA: Diagnosis not present

## 2019-04-12 MED ORDER — AMOXICILLIN-POT CLAVULANATE 875-125 MG PO TABS: 1.0000 | ORAL_TABLET | Freq: Two times a day (BID) | ORAL | 0 refills | Status: DC

## 2019-04-12 NOTE — Progress Notes (Signed)
Radiation Oncology         [phone] ________________________________  Name: Cheryl Knight        MRN: [id number]  Date of Service: 04/12/2019 DOB: [birth year]  CC:McLean-Scocuzza, Nino Glow, MD  Truitt Merle, MD     REFERRING PHYSICIAN: Truitt Merle, MD   DIAGNOSIS: The encounter diagnosis was Malignant neoplasm of lower-outer quadrant of right breast of female, estrogen receptor positive ([place]).   HISTORY OF PRESENT ILLNESS: Cheryl Knight is a 64 y.o. female originally seen in the multidisciplinary breast clinic for a new diagnosis of right breast cancer. The patient was noted to have a screening detected mass in the right breast.  Diagnostic imaging measured this at 7 o'clock position measuring 5 mm, her axilla was negative for adenopathy.  She underwent a biopsy on 03/14/2019 that revealed a grade 1 invasive ductal carcinoma, her tumor was ER/PR positive, HER-2 negative with a Ki-67 of 10%.  She proceeded with lumpectomy on 03/28/2018 one of the right breast with sentinel biopsy, this revealed a 9 mm grade 1 invasive ductal carcinoma with associated intermediate grade DCIS.  Her margins were negative for malignancy both for invasive and in situ disease.  She had 2 sentinel nodes that were sampled that were negative for disease. She is seen today via MyChart to discuss adjuvant therapy.    PREVIOUS RADIATION THERAPY: No   PAST MEDICAL HISTORY:  Past Medical History:  Diagnosis Date   Cancer ([place]) 02/2019   right breast IDC   Cataracts, bilateral    tbd surgery 8413   Complication of anesthesia    Cough    Family history of breast cancer    Family history of lung cancer    Family history of prostate cancer    Family history of skin cancer    GERD (gastroesophageal reflux disease)    Headache    High cholesterol    Hypertension    Hypothyroidism    Irritable bowel    Osteoarthritis    Osteoarthritis    i.e right knee    PONV (postoperative nausea and  vomiting)    Pre-diabetes    Rheumatoid arthritis ([place])    Thyroid disease        PAST SURGICAL HISTORY: Past Surgical History:  Procedure Laterality Date   ABDOMINAL HYSTERECTOMY     age 65 ovaries out   BREAST LUMPECTOMY WITH RADIOACTIVE SEED AND SENTINEL LYMPH NODE BIOPSY Right 03/28/2019   Procedure: RIGHT BREAST LUMPECTOMY WITH RADIOACTIVE SEED AND SENTINEL LYMPH NODE BIOPSY;  Surgeon: Donnie Mesa, MD;  Location: [facility];  Service: General;  Laterality: Right;  PEC BLOCK FOR POST OP PAIN   CATARACT EXTRACTION, BILATERAL     ESOPHAGEAL DILATION     TONSILLECTOMY     TUBAL LIGATION       FAMILY HISTORY:  Family History  Problem Relation Age of Onset   Hypertension Mother    Thyroid disease Mother    Breast cancer Mother 51       breast dx age 45 died age 31    Prostate cancer Father 75       metastatic   Stroke Maternal Grandmother    Skin cancer Maternal Uncle        dx. in his 54s   Lung cancer Maternal Uncle        smoker, died age 55     SOCIAL HISTORY:  reports that she quit smoking about 21 years ago. Her smoking use included  cigarettes. She has a 1.75 pack-year smoking history. She has never used smokeless tobacco. She reports current alcohol use. She reports that she does not use drugs.  The patient is married and lives in [place]. She enjoys fishing and bow hunting. 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 Her husband is an EMT.    ALLERGIES: Lisinopril, Meperidine hcl, and Pentazocine lactate   MEDICATIONS:  Current Outpatient Medications  Medication Sig Dispense Refill   albuterol (VENTOLIN HFA) 108 (90 Base) MCG/ACT inhaler Inhale 1-2 puffs into the lungs every 6 (six) hours as needed for wheezing or shortness of breath. 18 g 11   betamethasone dipropionate (DIPROLENE) 0.05 % cream Apply topically 2 (two) times daily. 45 g 3   Calcium Carbonate-Vitamin D  (CALCIUM PLUS VITAMIN D PO) Take by mouth.     cetirizine (ZYRTEC) 10 MG tablet Take 10 mg by mouth daily.     cyclobenzaprine (FLEXERIL) 10 MG tablet Take 1 tablet (10 mg total) by mouth daily. 90 tablet 1   esomeprazole (NEXIUM) 40 MG capsule 1 by mouth twice a day 200 capsule 4   fish oil-omega-3 fatty acids 1000 MG capsule Take 1 g by mouth daily.     fluticasone (FLONASE) 50 MCG/ACT nasal spray Place 2 sprays into both nostrils daily. 16 g 11   glucosamine-chondroitin 500-400 MG tablet Take 1 tablet by mouth 3 (three) times daily.     HYDROcodone-acetaminophen (NORCO) 7.5-325 MG tablet Take 1 tab by mouth twice daily as needed for migraine 60 tablet 0   HYDROcodone-acetaminophen (NORCO/VICODIN) 5-325 MG tablet Take 1 tablet by mouth every 6 (six) hours as needed for moderate pain. 15 tablet 0   leflunomide (ARAVA) 20 MG tablet Take 1 tablet (20 mg total) by mouth daily. 30 tablet 2   levothyroxine (SYNTHROID) 100 MCG tablet Daily 30 minutes before food 90 tablet 3   olmesartan-hydrochlorothiazide (BENICAR HCT) 20-12.5 MG tablet Take 1 tablet by mouth daily. In am 90 tablet 3   pilocarpine (SALAGEN) 5 MG tablet TAKE 1 TABLET BY MOUTH THREE TIMES DAILY 270 tablet 0   pravastatin (PRAVACHOL) 20 MG tablet Take 1 tablet (20 mg total) by mouth every Monday, Wednesday, and Friday. (Patient taking differently: Take 20 mg by mouth every Monday, Wednesday, and Friday. 3 times a week) 90 tablet 3   promethazine (PHENERGAN) 25 MG tablet Take 1 tablet (25 mg total) by mouth every 6 (six) hours as needed. 30 tablet 2   RESTASIS 0.05 % ophthalmic emulsion daily.      SUMAtriptan (IMITREX) 50 MG tablet TAKE 1 TABLET BY MOUTH AS NEEDED FOR HEADACHE 9 tablet 11   topiramate (TOPAMAX) 100 MG tablet Take 1 tablet (100 mg total) by mouth daily. 90 tablet 3   traMADol (ULTRAM) 50 MG tablet Take 2 tablets by mouth twice daily as needed for pain relief. 120 tablet 0    lisinopril-hydrochlorothiazide (ZESTORETIC) 20-12.5 MG tablet Take 1 tablet by mouth daily.     No current facility-administered medications for this encounter.     REVIEW OF SYSTEMS: On review of systems, the patient reports that she is doing well overall. She describes a chronic cough that has recently been managed by PCP with switching from ACE inhibitor to ARB. She did not notice a difference, and reports that in the last day or so had a low grade temperature of 100. She denies any known covid exposures and reports she faithfully wears a mask  when she goes out as does her husband. She notes increasing post nasal drip and rhinorrhea despite her flonase and zyrtec. She denies any chest pain, shortness of breath, cough, fevers, chills, night sweats, unintended weight changes. She denies any bowel or bladder disturbances, and denies abdominal pain, nausea or vomiting. She denies any new musculoskeletal or joint aches or pains. A complete review of systems is obtained and is otherwise negative.     PHYSICAL EXAM:  Wt Readings from Last 3 Encounters:  04/05/19 218 lb (98.9 kg)  03/28/19 213 lb 13.5 oz (97 kg)  03/21/19 216 lb 8 oz (98.2 kg)   Temp Readings from Last 3 Encounters:  03/28/19 97.8 F (36.6 C) (Oral)  03/21/19 98.9 F (37.2 C) (Temporal)  02/14/18 98 F (36.7 C) (Oral)   BP Readings from Last 3 Encounters:  04/05/19 131/77  03/28/19 99/63  03/21/19 (!) 123/94   Pulse Readings from Last 3 Encounters:  04/05/19 94  03/28/19 93  03/21/19 87    In general this is a well appearing caucasian female in no acute distress. She's alert and oriented x4 and appropriate throughout the examination. Cardiopulmonary assessment is negative for acute distress and she exhibits normal effort. Bilateral breast exam is deferred.    ECOG = 0  0 - Asymptomatic (Fully active, able to carry on all predisease activities without restriction)  1 - Symptomatic but completely ambulatory  (Restricted in physically strenuous activity but ambulatory and able to carry out work of a light or sedentary nature. For example, light housework, office work)  2 - Symptomatic, <50% in bed during the day (Ambulatory and capable of all self care but unable to carry out any work activities. Up and about more than 50% of waking hours)  3 - Symptomatic, >50% in bed, but not bedbound (Capable of only limited self-care, confined to bed or chair 50% or more of waking hours)  4 - Bedbound (Completely disabled. Cannot carry on any self-care. Totally confined to bed or chair)  5 - Death   Cheryl Knight MM, Cheryl Knight, Cheryl Knight, et al. [phone]). "Toxicity and response criteria of the [facility] Group". [place] Oncol. 5 (6): 649-55    LABORATORY DATA:  Lab Results  Component Value Date   WBC 6.6 03/21/2019   HGB 14.4 03/21/2019   HCT 44.0 03/21/2019   MCV 96.1 03/21/2019   PLT 303 03/21/2019   Lab Results  Component Value Date   NA 143 03/21/2019   K 3.4 (L) 03/21/2019   CL 108 03/21/2019   CO2 26 03/21/2019   Lab Results  Component Value Date   ALT 12 03/21/2019   AST 19 03/21/2019   ALKPHOS 61 03/21/2019   BILITOT 0.3 03/21/2019      RADIOGRAPHY: NM Sentinel Node Inj-No Rpt (Breast)  Result Date: 03/28/2019 Sulfur colloid was injected by the nuclear medicine technologist for melanoma sentinel node.       IMPRESSION/PLAN: 1. Stage IA, [id number] grade 1, ER/PR positive invasive ductal carcinoma of the right breast. Dr. Lisbeth Knight discusses the final results of her pathology findings and reviews the nature of right breast disease. The consensus from the breast conference includes breast conservation with lumpectomy with sentinel node biopsy.  She would benefit from external radiotherapy to the breast to reduce the risk of local recurrence, followed by antiestrogen therapy. We discussed the risks, benefits, short, and long term effects of radiotherapy, and the patient is  interested in proceeding. Dr. Lisbeth Knight discusses the  delivery and logistics of radiotherapy and anticipates a course of 4 weeks of radiotherapy. She will come in on 05/01/19 for simulation at which time she will sign consent to proceed.  2. Rhematoid Arthritis. She has discontinued her Methotrexate with Dr. Estanislado Pandy and will be starting Leflunomide.  3. Persistent cough and recent rhinorrhea. The patient has had a cough for 6 months and recently noticed a low grade temperature. She is no longer taking her ACE inhibitor and will also follow up with her PCP. She denies any covid exposure, and has been at home, and when she had been out was wearing a mask and socially distancing. She will try a course of Augmentin, but if her symptoms progress, she was offered CXR and definitive covid testing. She is in agreement with this plan. She will also try to switch her antihistamine to claritin.  This encounter was provided by telemedicine platform [employer].  The patient has provided two factor identification and has given verbal consent for this type of encounter and has been advised to only accept a meeting of this type in a secure network environment. The time spent during this encounter was 45 minutes including preparation, discussion, and coordination of the patient's care. The attendants for this meeting include Dr. Lisbeth Knight, Cheryl Knight  and Cheryl Knight and her husband Cheryl Knight.  During the encounter, Dr. Lisbeth Knight, and Cheryl Knight were located at [facility] Radiation Oncology Department.  Cheryl Knight was located at home with her husband Cheryl Knight.    The above documentation reflects my direct findings during this shared patient visit. Please see the separate note by Dr. Lisbeth Knight on this date for the remainder of the patient's plan of care.    Carola [place], PAC

## 2019-04-13 ENCOUNTER — Encounter: Payer: Self-pay | Admitting: Internal Medicine

## 2019-04-18 ENCOUNTER — Encounter: Payer: Self-pay | Admitting: *Deleted

## 2019-04-18 ENCOUNTER — Other Ambulatory Visit (INDEPENDENT_AMBULATORY_CARE_PROVIDER_SITE_OTHER): Payer: PPO

## 2019-04-18 ENCOUNTER — Other Ambulatory Visit: Payer: Self-pay

## 2019-04-18 ENCOUNTER — Telehealth: Payer: Self-pay | Admitting: Hematology and Oncology

## 2019-04-18 DIAGNOSIS — I10 Essential (primary) hypertension: Secondary | ICD-10-CM

## 2019-04-18 DIAGNOSIS — E119 Type 2 diabetes mellitus without complications: Secondary | ICD-10-CM

## 2019-04-18 DIAGNOSIS — E559 Vitamin D deficiency, unspecified: Secondary | ICD-10-CM

## 2019-04-18 DIAGNOSIS — Z1329 Encounter for screening for other suspected endocrine disorder: Secondary | ICD-10-CM

## 2019-04-18 DIAGNOSIS — E039 Hypothyroidism, unspecified: Secondary | ICD-10-CM | POA: Diagnosis not present

## 2019-04-18 LAB — CBC WITH DIFFERENTIAL/PLATELET
Basophils Absolute: 0 10*3/uL (ref 0.0–0.1)
Basophils Relative: 0.7 % (ref 0.0–3.0)
Eosinophils Absolute: 0.3 10*3/uL (ref 0.0–0.7)
Eosinophils Relative: 5.2 % — ABNORMAL HIGH (ref 0.0–5.0)
HCT: 40.2 % (ref 36.0–46.0)
Hemoglobin: 13.3 g/dL (ref 12.0–15.0)
Lymphocytes Relative: 40.4 % (ref 12.0–46.0)
Lymphs Abs: 2.7 10*3/uL (ref 0.7–4.0)
MCHC: 33.1 g/dL (ref 30.0–36.0)
MCV: 95 fl (ref 78.0–100.0)
Monocytes Absolute: 0.6 10*3/uL (ref 0.1–1.0)
Monocytes Relative: 9.6 % (ref 3.0–12.0)
Neutro Abs: 3 10*3/uL (ref 1.4–7.7)
Neutrophils Relative %: 44.1 % (ref 43.0–77.0)
Platelets: 312 10*3/uL (ref 150.0–400.0)
RBC: 4.23 Mil/uL (ref 3.87–5.11)
RDW: 13.8 % (ref 11.5–15.5)
WBC: 6.7 10*3/uL (ref 4.0–10.5)

## 2019-04-18 LAB — COMPREHENSIVE METABOLIC PANEL
ALT: 9 U/L (ref 0–35)
AST: 14 U/L (ref 0–37)
Albumin: 4.1 g/dL (ref 3.5–5.2)
Alkaline Phosphatase: 54 U/L (ref 39–117)
BUN: 16 mg/dL (ref 6–23)
CO2: 29 mEq/L (ref 19–32)
Calcium: 10 mg/dL (ref 8.4–10.5)
Chloride: 104 mEq/L (ref 96–112)
Creatinine, Ser: 0.75 mg/dL (ref 0.40–1.20)
GFR: 77.41 mL/min (ref >60.00)
Glucose, Bld: 128 mg/dL — ABNORMAL HIGH (ref 70–99)
Potassium: 3.5 mEq/L (ref 3.5–5.1)
Sodium: 141 mEq/L (ref 135–145)
Total Bilirubin: 0.2 mg/dL (ref 0.2–1.2)
Total Protein: 6.8 g/dL (ref 6.0–8.3)

## 2019-04-18 LAB — LIPID PANEL
Cholesterol: 181 mg/dL (ref 0–200)
HDL: 36.4 mg/dL — ABNORMAL LOW (ref >39.00)
LDL Cholesterol: 113 mg/dL — ABNORMAL HIGH (ref 0–99)
NonHDL: 144.22
Total CHOL/HDL Ratio: 5
Triglycerides: 157 mg/dL — ABNORMAL HIGH (ref 0.0–149.0)
VLDL: 31.4 mg/dL (ref 0.0–40.0)

## 2019-04-18 LAB — TSH: TSH: 4.39 u[IU]/mL (ref 0.35–4.50)

## 2019-04-18 LAB — VITAMIN D 25 HYDROXY (VIT D DEFICIENCY, FRACTURES): VITD: 34.12 ng/mL (ref 30.00–100.00)

## 2019-04-18 LAB — HEMOGLOBIN A1C: Hgb A1c MFr Bld: 6.9 % — ABNORMAL HIGH (ref 4.6–6.5)

## 2019-04-18 NOTE — Telephone Encounter (Signed)
Scheduled appt per 3/31 sch message - mailed reminder letter with appt date and time

## 2019-04-19 LAB — URINALYSIS, ROUTINE W REFLEX MICROSCOPIC
Bilirubin Urine: NEGATIVE
Glucose, UA: NEGATIVE
Hgb urine dipstick: NEGATIVE
Ketones, ur: NEGATIVE
Leukocytes,Ua: NEGATIVE
Nitrite: NEGATIVE
Protein, ur: NEGATIVE
Specific Gravity, Urine: 1.019 (ref 1.001–1.03)
pH: 6 (ref 5.0–8.0)

## 2019-04-19 LAB — MICROALBUMIN / CREATININE URINE RATIO
Creatinine, Urine: 120 mg/dL (ref 20–275)
Microalb Creat Ratio: 4 mcg/mg creat (ref <30)
Microalb, Ur: 0.5 mg/dL

## 2019-04-25 ENCOUNTER — Other Ambulatory Visit: Payer: Self-pay

## 2019-04-27 ENCOUNTER — Encounter: Payer: Self-pay | Admitting: Internal Medicine

## 2019-04-27 ENCOUNTER — Other Ambulatory Visit: Payer: Self-pay

## 2019-04-27 ENCOUNTER — Ambulatory Visit (INDEPENDENT_AMBULATORY_CARE_PROVIDER_SITE_OTHER): Payer: PPO | Admitting: Internal Medicine

## 2019-04-27 VITALS — BP 124/84 | HR 110 | Temp 97.7°F | Ht 68.0 in | Wt 217.4 lb

## 2019-04-27 DIAGNOSIS — R05 Cough: Secondary | ICD-10-CM

## 2019-04-27 DIAGNOSIS — K219 Gastro-esophageal reflux disease without esophagitis: Secondary | ICD-10-CM

## 2019-04-27 DIAGNOSIS — J4530 Mild persistent asthma, uncomplicated: Secondary | ICD-10-CM

## 2019-04-27 DIAGNOSIS — E119 Type 2 diabetes mellitus without complications: Secondary | ICD-10-CM

## 2019-04-27 DIAGNOSIS — E039 Hypothyroidism, unspecified: Secondary | ICD-10-CM

## 2019-04-27 DIAGNOSIS — R059 Cough, unspecified: Secondary | ICD-10-CM

## 2019-04-27 DIAGNOSIS — R1310 Dysphagia, unspecified: Secondary | ICD-10-CM

## 2019-04-27 MED ORDER — PANTOPRAZOLE SODIUM 40 MG PO TBEC: 40.0000 mg | DELAYED_RELEASE_TABLET | Freq: Every day | ORAL | 3 refills | Status: DC

## 2019-04-27 MED ORDER — HYDROCOD POLST-CPM POLST ER 10-8 MG/5ML PO SUER: 5.0000 mL | Freq: Every evening | ORAL | 0 refills | Status: DC | PRN

## 2019-04-27 MED ORDER — BUDESONIDE-FORMOTEROL FUMARATE 160-4.5 MCG/ACT IN AERO: 2.0000 | INHALATION_SPRAY | Freq: Two times a day (BID) | RESPIRATORY_TRACT | 12 refills | Status: DC

## 2019-04-27 MED ORDER — PREDNISONE 20 MG PO TABS: 20.0000 mg | ORAL_TABLET | Freq: Every day | ORAL | 0 refills | Status: DC

## 2019-04-27 NOTE — Progress Notes (Signed)
Chief Complaint  Patient presents with  . Follow-up  . Cough    Ongoing cough for a year. was treated for sinus infection and finsihed antibiotics. Sometimes coughing up green. Stopped taking lisinopril b/c thought causing cough. Cough is worse.    F/u  1. C/o cough and wheezing worse at night and worsening x 1 year. Sxs worse with pollen, ragweed, dust on albuterol but alone not helping  2. Dysphagia nexium 20 mg bid and still has dysphagia. She has GERD and h/o throat stretched x 1  3. HTN on benicar 20-12.5 and BP controlled today  4. DM 2 A1C 6.9  5. Hypothyroidism on 100 mcg qd   Review of Systems  Constitutional: Positive for malaise/fatigue. Negative for weight loss.  HENT: Negative for hearing loss.   Eyes: Negative for blurred vision.  Respiratory: Positive for cough, shortness of breath and wheezing.   Cardiovascular: Negative for chest pain.  Gastrointestinal: Positive for heartburn.       +dysphagia   Musculoskeletal: Negative for falls.  Skin: Negative for rash.  Neurological: Negative for headaches.  Psychiatric/Behavioral: Negative for depression.   Past Medical History:  Diagnosis Date  . Cancer ([place]) 02/2019   right breast IDC  . Cataracts, bilateral    tbd surgery 2021  . Complication of anesthesia   . Cough   . Family history of breast cancer   . Family history of lung cancer   . Family history of prostate cancer   . Family history of skin cancer   . GERD (gastroesophageal reflux disease)   . Headache   . High cholesterol   . Hypertension   . Hypothyroidism   . Irritable bowel   . Osteoarthritis   . Osteoarthritis    i.e right knee   . PONV (postoperative nausea and vomiting)   . Pre-diabetes   . Rheumatoid arthritis ([place])   . Thyroid disease    Past Surgical History:  Procedure Laterality Date  . ABDOMINAL HYSTERECTOMY     age 61 ovaries out  . BREAST LUMPECTOMY WITH RADIOACTIVE SEED AND SENTINEL LYMPH NODE BIOPSY Right 03/28/2019   Procedure: RIGHT BREAST LUMPECTOMY WITH RADIOACTIVE SEED AND SENTINEL LYMPH NODE BIOPSY;  Surgeon: Donnie Mesa, MD;  Location: [facility];  Service: General;  Laterality: Right;  PEC BLOCK FOR POST OP PAIN  . CATARACT EXTRACTION, BILATERAL    . ESOPHAGEAL DILATION     Dr. Olevia Perches 15 years from 2021  . TONSILLECTOMY    . TUBAL LIGATION     Family History  Problem Relation Age of Onset  . Hypertension Mother   . Thyroid disease Mother   . Breast cancer Mother 50       breast dx age 84 died age 26   . Prostate cancer Father 38       metastatic  . Stroke Maternal Grandmother   . Skin cancer Maternal Uncle        dx. in his 29s  . Lung cancer Maternal Uncle        smoker, died age 3   Social History   Socioeconomic History  . Marital status: Married    Spouse name: Not on file  . Number of children: 2  . Years of education: Not on file  . Highest education level: Not on file  Occupational History  . Occupation: nurse  Tobacco Use  . Smoking status: Former Smoker    Packs/day: 0.25    Years: 7.00    Pack  years: 1.75    Types: Cigarettes    Quit date: 2000    Years since quitting: 21.2  . Smokeless tobacco: Never Used  Substance and Sexual Activity  . Alcohol use: Yes    Comment: socially   . Drug use: Never  . Sexual activity: Yes  Other Topics Concern  . 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 Difficulty of Paying Living Expenses:   Food Insecurity:   . Worried About Charity fundraiser in the Last Year:   . Arboriculturist in the Last Year:   Transportation Needs:   . Film/video editor (Medical):   Marland Kitchen Lack of Transportation (Non-Medical):   Physical Activity:   . Days of Exercise per Week:   . Minutes of Exercise per Session:   Stress:   . Feeling of Stress :   Social Connections:   . Frequency of Communication with Friends and  Family:   . Frequency of Social Gatherings with Friends and Family:   . Attends Religious Services:   . Active Member of Clubs or Organizations:   . Attends Archivist Meetings:   Marland Kitchen Marital Status:   Intimate Partner Violence:   . Fear of Current or Ex-Partner:   . Emotionally Abused:   Marland Kitchen Physically Abused:   . Sexually Abused:    Current Meds  Medication Sig  . albuterol (VENTOLIN HFA) 108 (90 Base) MCG/ACT inhaler Inhale 1-2 puffs into the lungs every 6 (six) hours as needed for wheezing or shortness of breath.  . betamethasone dipropionate (DIPROLENE) 0.05 % cream Apply topically 2 (two) times daily.  . Calcium Carbonate-Vitamin D (CALCIUM PLUS VITAMIN D PO) Take by mouth.  . cetirizine (ZYRTEC) 10 MG tablet Take 10 mg by mouth daily.  . cyclobenzaprine (FLEXERIL) 10 MG tablet Take 1 tablet (10 mg total) by mouth daily.  Marland Kitchen esomeprazole (NEXIUM) 40 MG capsule 1 by mouth twice a day  . fish oil-omega-3 fatty acids 1000 MG capsule Take 1 g by mouth daily.  . fluticasone (FLONASE) 50 MCG/ACT nasal spray Place 2 sprays into both nostrils daily.  Marland Kitchen glucosamine-chondroitin 500-400 MG tablet Take 1 tablet by mouth 3 (three) times daily.  Marland Kitchen HYDROcodone-acetaminophen (NORCO) 7.5-325 MG tablet Take 1 tab by mouth twice daily as needed for migraine  . leflunomide (ARAVA) 20 MG tablet Take 1 tablet (20 mg total) by mouth daily.  Marland Kitchen levothyroxine (SYNTHROID) 100 MCG tablet Daily 30 minutes before food  . olmesartan-hydrochlorothiazide (BENICAR HCT) 20-12.5 MG tablet Take 1 tablet by mouth daily. In am  . pilocarpine (SALAGEN) 5 MG tablet TAKE 1 TABLET BY MOUTH THREE TIMES DAILY  . pravastatin (PRAVACHOL) 20 MG tablet Take 1 tablet (20 mg total) by mouth every Monday, Wednesday, and Friday. (Patient taking differently: Take 20 mg by mouth every Monday, Wednesday, and Friday. 3 times a week)  . promethazine (PHENERGAN) 25 MG tablet Take 1 tablet (25 mg total) by mouth every 6 (six) hours as  needed.  . RESTASIS 0.05 % ophthalmic emulsion daily.   . SUMAtriptan (IMITREX) 50 MG tablet TAKE 1 TABLET BY MOUTH AS NEEDED FOR HEADACHE  . topiramate (TOPAMAX) 100 MG tablet Take 1 tablet (100 mg total) by mouth daily.  . traMADol (ULTRAM) 50 MG tablet Take 2 tablets by mouth twice daily as needed for pain  relief. (Patient taking differently: 50 mg 2 (two) times daily. Take 2 tablets by mouth twice daily as needed for pain relief.)   Allergies  Allergen Reactions  . Lisinopril Cough  . Meperidine Hcl     REACTION: vomiting  . Pentazocine Lactate     REACTION: hallucinations   Recent Results (from the past 2160 hour(s))  HM DEXA SCAN     Status: None   Collection Time: 02/22/19 12:00 AM  Result Value Ref Range   HM Dexa Scan osteopenia     Comment: [facility]  HM MAMMOGRAPHY     Status: Abnormal   Collection Time: 02/22/19 12:00 AM  Result Value Ref Range   HM Mammogram Self Reported Abnormal (A) 0-4 Bi-Rad, Self Reported Normal    Comment: right breast mass further imaging rec   HM MAMMOGRAPHY     Status: Abnormal   Collection Time: 03/09/19 12:00 AM  Result Value Ref Range   HM Mammogram Self Reported Abnormal (A) 0-4 Bi-Rad, Self Reported Normal    Comment: 03/09/19 [facility] 5 mm mass right breast needs bx   HM MAMMOGRAPHY     Status: Abnormal   Collection Time: 03/14/19 12:00 AM  Result Value Ref Range   HM Mammogram Self Reported Abnormal (A) 0-4 Bi-Rad, Self Reported Normal    Comment: [facility] +breast cancer right breast 5 mm mass   Genetic Screening Order     Status: None   Collection Time: 03/21/19 12:13 PM  Result Value Ref Range   Genetic Screening Order Collected by Laboratory     Comment: Performed at [facility], [place] [address], [place], [place] [address]  CMP ([place] only)     Status: Abnormal   Collection Time: 03/21/19 12:13 PM  Result Value Ref Range   Sodium 143 135 - 145 mmol/L   Potassium 3.4 (L) 3.5 - 5.1 mmol/L   Chloride 108  98 - 111 mmol/L   CO2 26 22 - 32 mmol/L   Glucose, Bld 168 (H) 70 - 99 mg/dL    Comment: Glucose reference range applies only to samples taken after fasting for at least 8 hours.   BUN 13 8 - 23 mg/dL   Creatinine 0.78 0.44 - 1.00 mg/dL   Calcium 9.5 8.9 - 10.3 mg/dL   Total Protein 7.4 6.5 - 8.1 g/dL   Albumin 4.1 3.5 - 5.0 g/dL   AST 19 15 - 41 U/L   ALT 12 0 - 44 U/L   Alkaline Phosphatase 61 38 - 126 U/L   Total Bilirubin 0.3 0.3 - 1.2 mg/dL   GFR, Est Non Af Am >60 >60 mL/min   GFR, Est AFR Am >60 >60 mL/min   Anion gap 9 5 - 15    Comment: Performed at [facility], [place] [address], [place], [place] [address]  CBC with Differential ([place] Only)     Status: None   Collection Time: 03/21/19 12:13 PM  Result Value Ref Range   WBC Count 6.6 4.0 - 10.5 K/uL   RBC 4.58 3.87 - 5.11 MIL/uL   Hemoglobin 14.4 12.0 - 15.0 g/dL   HCT 44.0 36.0 - 46.0 %   MCV 96.1 80.0 - 100.0 fL   MCH 31.4 26.0 - 34.0 pg   MCHC 32.7 30.0 - 36.0 g/dL   RDW 13.7 11.5 - 15.5 %   Platelet Count 303 150 - 400 K/uL   nRBC 0.0 0.0 - 0.2 %   Neutrophils  Relative % 39 %   Neutro Abs 2.6 1.7 - 7.7 K/uL   Lymphocytes Relative 49 %   Lymphs Abs 3.2 0.7 - 4.0 K/uL   Monocytes Relative 8 %   Monocytes Absolute 0.5 0.1 - 1.0 K/uL   Eosinophils Relative 3 %   Eosinophils Absolute 0.2 0.0 - 0.5 K/uL   Basophils Relative 1 %   Basophils Absolute 0.1 0.0 - 0.1 K/uL   Immature Granulocytes 0 %   Abs Immature Granulocytes 0.02 0.00 - 0.07 K/uL    Comment: Performed at [facility], [place] [address], [place], [place] [address]  SARS CORONAVIRUS 2 (TAT 6-24 HRS) Nasopharyngeal Nasopharyngeal Swab     Status: None   Collection Time: 03/24/19  2:06 PM   Specimen: Nasopharyngeal Swab  Result Value Ref Range   SARS Coronavirus 2 NEGATIVE NEGATIVE    Comment: (NOTE) SARS-CoV-2 target nucleic acids are NOT DETECTED. The SARS-CoV-2 RNA is generally detectable in  upper and lower respiratory specimens during the acute phase of infection. Negative results do not preclude SARS-CoV-2 infection, do not rule out co-infections with other pathogens, and should not be used as the sole basis for treatment or other patient management decisions. Negative results must be combined with clinical observations, patient history, and epidemiological information. The expected result is Negative. Fact Sheet for Patients: SugarRoll.be Fact Sheet for Healthcare Providers: https://www.woods-mathews.com/ This test is not yet approved or cleared by the [place] FDA and  has been authorized for detection and/or diagnosis of SARS-CoV-2 by FDA under an Emergency Use Authorization (EUA). This EUA will remain  in effect (meaning this test can be used) for the duration of the COVID-19 declaration under Section 56 4(b)(1) of the Act, 21 U.S.C. section 360bbb-3(b)(1), unless the authorization is terminated or revoked sooner. Performed at [facility], [place] [address], [place], [place] [address]   Surgical pathology     Status: None   Collection Time: 03/28/19  8:09 AM  Result Value Ref Range   SURGICAL PATHOLOGY      SURGICAL PATHOLOGY CASE: [id number] PATIENT: [place] [facility] Pathology Report     Clinical History: Right invasive ductal carcinoma (cm)     FINAL MICROSCOPIC DIAGNOSIS:  A. BREAST, RIGHT, LUMPECTOMY: - Invasive ductal carcinoma, 0.9 cm, Nottingham grade 1 of 3. - Ductal carcinoma in situ, intermediate nuclear grade. - Margins of resection are not involved.      - Invasive carcinoma, closest margin: 3 mm, superior.      - In situ carcinoma, closest margin: 3 mm, medial. - Biopsy site. - See oncology table.  B. SENTINEL LYMPH NODE, RIGHT AXILLARY #1, BIOPSY: - One lymph node, negative for carcinoma (0/1).  C. SENTINEL LYMPH NODE, RIGHT AXILLARY #2, BIOPSY: - One lymph node,  negative for carcinoma (0/1).  ONCOLOGY TABLE:  INVASIVE CARCINOMA OF THE BREAST:  Resection  Procedure: Lumpectomy Specimen Laterality: Right Tumor Size: 0.9 cm Histologic Type: Invasive ductal carcinoma Histologic Grade:      Glandular (Acinar)/Tubular Differentia tion: 2      Nuclear Pleomorphism: 2      Mitotic Rate: 1      Overall Grade: 1 Ductal Carcinoma In Situ: Present Tumor Extension: Limited to breast parenchyma Margins: Uninvolved by invasive carcinoma      Distance from closest margin: 3 mm      Specify closest margin: Superior DCIS Margins: Uninvolved by DCIS      Distance from closest margin: 3 mm  Specify closest margin: Medial Regional Lymph Nodes:      Number of Lymph Nodes Examined: 2      Number of Sentinel Nodes Examined: 2      Number of Lymph Nodes with Macrometastases (>2 mm): 0      Number of Lymph Nodes with Micrometastases: 0      Number of Lymph Nodes with Isolated Tumor Cells (=0.2 mm or =200 cells): 0      Size of Largest Metastatic Deposit: Not applicable      Extranodal Extension: Not applicable Treatment Effect: No known presurgical therapy Breast Biomarker Testing Performed on Previous Biopsy: Yes       Testing Performed on Case Number: [id number]            Estrogen Receptor: 95%, po sitive, strong            Progesterone Receptor: 90%, positive, moderate            HER2: By IHC: Equivocal (2+).  By FISH: Negative.  Ratio of HER2/CEN17 signals: 1.37.  Average HER2 copy number per cell: 3.15.            Ki-67: 10% Representative Tumor Block: A2 Pathologic Stage Classification (pTNM, AJCC 8th Edition): pT1b, pN0 (v4.4.0.0)  GROSS DESCRIPTION:  A.  Specimen type: Right breast lumpectomy, placed in formalin at 10:57 AM. Size: 7.3 x 5 x 2.5 cm. Orientation: Received inked as follows: Anterior = green, posterior = black, medial side yellow, lateral = orange, superior = red, inferior = blue. Localized area: Received without  localizing pins.  A radioactive seed is retrieved and stored per protocol. Cut surface: A ribbon-shaped biopsy clip is found embedded within a 0.9 x 0.6 x 0.5 cm tan-pink firm mass. Margins: The mass comes within 0.3 cm of the superior margin, and is grossly greater than 1 cm from the margins. Prognostic indicators: Not ordered a t gross. Block summary: 1 = mass with biopsy clip site. 2 = remaining mass. 3 = medial margin. 4 = lateral margin. 5 = inferior margin. 6 = anterior margin. 7 = posterior margin.  B.  Rapid Intraoperative Consult performed: No. Specimen: Right axillary sentinel lymph node #1, hot. Number and size: 1 lymph node, 1.5 cm. Cut Surface(s): Tan-pink, blue discolored. Block Summary: 1 = 1 lymph node serially sectioned.  C.  Rapid Intraoperative Consult performed: No. Specimen: Right axillary sentinel lymph node #2, hot. Number and size: 1 lymph node, 1.5 cm. Cut Surface(s): Tan-pink, not blue. Block Summary: 1 = 1 lymph node serially sectioned ([place] 03/28/2019).      Final Diagnosis performed by Gillie Manners, MD.   Electronically signed 03/29/2019 Technical and / or Professional components performed at [employer]. [facility], [place] [address], [place], [place] [address].  Immunohistochemistry Technical component (if applicable) was performed at [facility]. [address] [place], [place], [place] [address].   IMMUNOHISTOCHEMISTRY DISCLAIMER (if applicable): Some of these immunohistochemical stains may have been developed and the performance characteristics determine by [facility]. Some may not have been cleared or approved by the U.S. Food and Drug Administration. The FDA has determined that such clearance or approval is not necessary. This test is used for clinical purposes. It should not be regarded as investigational or for research. This laboratory is certified under the [place] (CLIA-88) as qualified to perform high complexity clinical laboratory testing.  The controls stained appropriately.   QuantiFERON-TB Gold Plus  Status: None   Collection Time: 04/05/19 10:28 AM  Result Value Ref Range   QuantiFERON-TB Gold Plus NEGATIVE NEGATIVE    Comment: Negative test result. M. tuberculosis complex  infection unlikely.    NIL 0.05 IU/mL   Mitogen-NIL 6.62 IU/mL   TB1-NIL 0.00 IU/mL   TB2-NIL 0.00 IU/mL    Comment: . The Nil tube value reflects the background interferon gamma immune response of the patient's blood sample. This value has been subtracted from the patient's displayed TB and Mitogen results. . Lower than expected results with the Mitogen tube prevent false-negative Quantiferon readings by detecting a patient with a potential immune suppressive condition and/or suboptimal pre-analytical specimen handling. . The TB1 Antigen tube is coated with the M. tuberculosis-specific antigens designed to elicit responses from TB antigen primed CD4+ helper T-lymphocytes. . The TB2 Antigen tube is coated with the M. tuberculosis-specific antigens designed to elicit responses from TB antigen primed CD4+ helper and CD8+ cytotoxic T-lymphocytes. . For additional information, please refer to https://education.questdiagnostics.com/faq/FAQ204 (This link is being provided for informational/ educational purposes only.) .   Serum protein electrophoresis with reflex     Status: None   Collection Time: 04/05/19 10:28 AM  Result Value Ref Range   Total Protein 6.9 6.1 - 8.1 g/dL   Albumin ELP 4.2 3.8 - 4.8 g/dL   Alpha 1 0.3 0.2 - 0.3 g/dL   Alpha 2 0.8 0.5 - 0.9 g/dL   Beta Globulin 0.5 0.4 - 0.6 g/dL   Beta 2 0.4 0.2 - 0.5 g/dL   Gamma Globulin 0.8 0.8 - 1.7 g/dL   SPE Interp.      Comment: Normal Serum Protein Electrophoresis Pattern. No abnormal protein bands (M-protein) detected.   IgG, IgA, IgM     Status: None   Collection  Time: 04/05/19 10:28 AM  Result Value Ref Range   Immunoglobulin A 142 70 - 320 mg/dL   IgG (Immunoglobin G), Serum 932 600 - 1,540 mg/dL   IgM, Serum 80 50 - 300 mg/dL  Hepatitis C antibody     Status: None   Collection Time: 04/05/19 10:28 AM  Result Value Ref Range   Hepatitis C Ab NON-REACTIVE NON-REACTI   SIGNAL TO CUT-OFF 0.01 <1.00    Comment: . HCV antibody was non-reactive. There is no laboratory  evidence of HCV infection. . In most cases, no further action is required. However, if recent HCV exposure is suspected, a test for HCV RNA (test code [id number]) is suggested. . For additional information please refer to http://education.questdiagnostics.com/faq/FAQ22v1 (This link is being provided for informational/ educational purposes only.) .   Hepatitis B core antibody, IgM     Status: None   Collection Time: 04/05/19 10:28 AM  Result Value Ref Range   Hep B C IgM NON-REACTIVE NON-REACTI  Microalbumin / creatinine urine ratio     Status: None   Collection Time: 04/18/19  8:01 AM  Result Value Ref Range   Creatinine, Urine 120 20 - 275 mg/dL   Microalb, Ur 0.5 mg/dL    Comment: Reference Range Not established    Microalb Creat Ratio 4 <30 mcg/mg creat    Comment: . The ADA defines abnormalities in albumin excretion as follows: Marland Kitchen Category         Result (mcg/mg creatinine) . Normal                    <30 Microalbuminuria         30-299  Clinical albuminuria   >  OR = 300 . The ADA recommends that at least two of three specimens collected within a 3-6 month period be abnormal before considering a patient to be within a diagnostic category.   Urinalysis, Routine w reflex microscopic     Status: None   Collection Time: 04/18/19  8:01 AM  Result Value Ref Range   Color, Urine YELLOW YELLOW   APPearance CLEAR CLEAR   Specific Gravity, Urine 1.019 1.001 - 1.03   pH 6.0 5.0 - 8.0   Glucose, UA NEGATIVE NEGATIVE   Bilirubin Urine NEGATIVE NEGATIVE   Ketones, ur  NEGATIVE NEGATIVE   Hgb urine dipstick NEGATIVE NEGATIVE   Protein, ur NEGATIVE NEGATIVE   Nitrite NEGATIVE NEGATIVE   Leukocytes,Ua NEGATIVE NEGATIVE  TSH     Status: None   Collection Time: 04/18/19  8:01 AM  Result Value Ref Range   TSH 4.39 0.35 - 4.50 uIU/mL  CBC with Differential/Platelet     Status: Abnormal   Collection Time: 04/18/19  8:01 AM  Result Value Ref Range   WBC 6.7 4.0 - 10.5 K/uL   RBC 4.23 3.87 - 5.11 Mil/uL   Hemoglobin 13.3 12.0 - 15.0 g/dL   HCT 40.2 36.0 - 46.0 %   MCV 95.0 78.0 - 100.0 fl   MCHC 33.1 30.0 - 36.0 g/dL   RDW 13.8 11.5 - 15.5 %   Platelets 312.0 150.0 - 400.0 K/uL   Neutrophils Relative % 44.1 43.0 - 77.0 %   Lymphocytes Relative 40.4 12.0 - 46.0 %   Monocytes Relative 9.6 3.0 - 12.0 %   Eosinophils Relative 5.2 (H) 0.0 - 5.0 %   Basophils Relative 0.7 0.0 - 3.0 %   Neutro Abs 3.0 1.4 - 7.7 K/uL   Lymphs Abs 2.7 0.7 - 4.0 K/uL   Monocytes Absolute 0.6 0.1 - 1.0 K/uL   Eosinophils Absolute 0.3 0.0 - 0.7 K/uL   Basophils Absolute 0.0 0.0 - 0.1 K/uL  Vitamin D (25 hydroxy)     Status: None   Collection Time: 04/18/19  8:01 AM  Result Value Ref Range   VITD 34.12 30.00 - 100.00 ng/mL  Hemoglobin A1c     Status: Abnormal   Collection Time: 04/18/19  8:01 AM  Result Value Ref Range   Hgb A1c MFr Bld 6.9 (H) 4.6 - 6.5 %    Comment: Glycemic Control Guidelines for People with Diabetes:Non Diabetic:  <6%Goal of Therapy: <7%Additional Action Suggested:  >8%   Lipid panel     Status: Abnormal   Collection Time: 04/18/19  8:01 AM  Result Value Ref Range   Cholesterol 181 0 - 200 mg/dL    Comment: ATP III Classification       Desirable:  < 200 mg/dL               Borderline High:  200 - 239 mg/dL          High:  > = 240 mg/dL   Triglycerides 157.0 (H) 0.0 - 149.0 mg/dL    Comment: Normal:  <150 mg/dLBorderline High:  150 - 199 mg/dL   HDL 36.40 (L) >39.00 mg/dL   VLDL 31.4 0.0 - 40.0 mg/dL   LDL Cholesterol 113 (H) 0 - 99 mg/dL   Total  CHOL/HDL Ratio 5     Comment:                Men          Women1/2 Average Risk     3.4  3.3Average Risk          5.0          4.42X Average Risk          9.6          7.13X Average Risk          15.0          11.0                       NonHDL 144.22     Comment: NOTE:  Non-HDL goal should be 30 mg/dL higher than patient's LDL goal (i.e. LDL goal of < 70 mg/dL, would have non-HDL goal of < 100 mg/dL)  Comprehensive metabolic panel     Status: Abnormal   Collection Time: 04/18/19  8:01 AM  Result Value Ref Range   Sodium 141 135 - 145 mEq/L   Potassium 3.5 3.5 - 5.1 mEq/L   Chloride 104 96 - 112 mEq/L   CO2 29 19 - 32 mEq/L   Glucose, Bld 128 (H) 70 - 99 mg/dL   BUN 16 6 - 23 mg/dL   Creatinine, Ser 0.75 0.40 - 1.20 mg/dL   Total Bilirubin 0.2 0.2 - 1.2 mg/dL   Alkaline Phosphatase 54 39 - 117 U/L   AST 14 0 - 37 U/L   ALT 9 0 - 35 U/L   Total Protein 6.8 6.0 - 8.3 g/dL   Albumin 4.1 3.5 - 5.2 g/dL   GFR 77.41 >60.00 mL/min   Calcium 10.0 8.4 - 10.5 mg/dL   Objective  Body mass index is 33.06 kg/m. Wt Readings from Last 3 Encounters:  04/27/19 217 lb 6.4 oz (98.6 kg)  04/05/19 218 lb (98.9 kg)  03/28/19 213 lb 13.5 oz (97 kg)   Temp Readings from Last 3 Encounters:  04/27/19 97.7 F (36.5 C) (Temporal)  03/28/19 97.8 F (36.6 C) (Oral)  03/21/19 98.9 F (37.2 C) (Temporal)   BP Readings from Last 3 Encounters:  04/27/19 124/84  04/05/19 131/77  03/28/19 99/63   Pulse Readings from Last 3 Encounters:  04/27/19 (!) 110  04/05/19 94  03/28/19 93    Physical Exam Vitals and nursing note reviewed.  Constitutional:      Appearance: Normal appearance. She is well-developed and well-groomed. She is obese.  HENT:     Head: Normocephalic and atraumatic.  Eyes:     Conjunctiva/sclera: Conjunctivae normal.     Pupils: Pupils are equal, round, and reactive to light.  Cardiovascular:     Rate and Rhythm: Regular rhythm. Tachycardia present.     Heart sounds:  Normal heart sounds. No murmur.  Pulmonary:     Effort: Pulmonary effort is normal.     Breath sounds: Wheezing present.     Comments: Cough on exam  Skin:    General: Skin is warm and dry.  Neurological:     General: No focal deficit present.     Mental Status: She is alert and oriented to person, place, and time. Mental status is at baseline.     Gait: Gait normal.  Psychiatric:        Attention and Perception: Attention and perception normal.        Mood and Affect: Mood and affect normal.        Speech: Speech normal.        Behavior: Behavior normal. Behavior is cooperative.        Thought Content: Thought content normal.  Cognition and Memory: Cognition and memory normal.        Judgment: Judgment normal.     Assessment  Plan  Mild persistent extrinsic asthma without complication - Plan: budesonide-formoterol (SYMBICORT) 160-4.5 MCG/ACT inhaler, Ambulatory referral to Pulmonology, predniSONE (DELTASONE) 20 MG tablet, chlorpheniramine-HYDROcodone (TUSSIONEX PENNKINETIC ER) 10-8 MG/5ML SUER  Dysphagia, unspecified type/GERD s/p dilatation x1 in the past - Plan: Ambulatory referral to Gastroenterology, pantoprazole (PROTONIX) 40 MG tablet Stop nexium 20 mg bid    Hypothyroidism, unspecified type Cont meds   Type 2 diabetes mellitus without complication, without long-term current use of insulin (HCC)  Monitor A1C consider tx in future if still elevated at f/u   HM Flu shot declines  Tdap utd due in 2026  prevnar disc today could consider in the future  pna 23 disc today could consider in the future  shingrix ins wouldnt cover  covid 19 vaccine tbd pt reports she normally does not get vaccines but considering this and is scheduled   Labs fasting 04/18/19 will [facility] Dr. Estanislado Pandy [facility] saw summer 2020 f/u yearly h/o [facility] chest  mammo [facility] 02/22/19 abnormal and repeat 03/09/19 right breast mass right breast rec biopsy did she get this?   Pap  s/p total hysterectomy due to uterine prolapse h/o abnormal pap remotely ovaries removed  Colonoscopy will refer Dr. Hilarie Fredrickson [facility] she is due  dexa get [facility] upcoming 02/22/19 with mammo  Former smoker quit 1 year ago in 2019/2020 but ocass. Light smoking 2x per year socially  Pending cataract surgery 02/13/19 Dr. Herbert Deaner  Surgery note 03/21/19 Dr. Georgette Dover   INVASIVE DUCTAL CARCINOMA OF RIGHT BREAST, STAGE 1 (C50.911) Impression: RLOQ 5 mm 5 cmfn IDC, ER/PR +, Her2 - 04/24/19 [facility] f/u Dr. Georgette Dover 03/28/19 right radioactive seed lumpectomy and sentinel ln bx invasive ductal ca 0.9 cm with some surrounding DCIS margins negative 2 nodes negative begin radiation next week with HT after radiation  F/u in 3 months   Provider: Dr. Olivia Mackie [facility]

## 2019-04-27 NOTE — Patient Instructions (Signed)
Asthma Attack Prevention, Adult Although you may not be able to control the fact that you have asthma, you can take actions to prevent episodes of asthma (asthma attacks). These actions include:  Creating a written plan for managing and treating your asthma attacks (asthma action plan).  Monitoring your asthma.  Avoiding things that can irritate your airways or make your asthma symptoms worse (asthma triggers).  Taking your medicines as directed.  Acting quickly if you have signs or symptoms of an asthma attack. What are some ways to prevent an asthma attack? Create a plan Work with your health care provider to create an asthma action plan. This plan should include:  A list of your asthma triggers and how to avoid them.  A list of symptoms that you experience during an asthma attack.  Information about when to take medicine and how much medicine to take.  Information to help you understand your peak flow measurements.  Contact information for your health care providers.  Daily actions that you can take to control asthma. Monitor your asthma To monitor your asthma:  Use your peak flow meter every morning and every evening for 2-3 weeks. Record the results in a journal. A drop in your peak flow numbers on one or more days may mean that you are starting to have an asthma attack, even if you are not having symptoms.  When you have asthma symptoms, write them down in a journal.  Avoid asthma triggers Work with your health care provider to find out what your asthma triggers are. This can be done by:  Being tested for allergies.  Keeping a journal that notes when asthma attacks occur and what may have contributed to them.  Asking your health care provider whether other medical conditions make your asthma worse. Common asthma triggers include:  Dust.  Smoke. This includes campfire smoke and secondhand smoke from tobacco products.  Pet dander.  Trees, grasses or  pollens.  Very cold, dry, or humid air.  Mold.  Foods that contain high amounts of sulfites.  Strong smells.  Engine exhaust and air pollution.  Aerosol sprays and fumes from household cleaners.  Household pests and their droppings, including dust mites and cockroaches.  Certain medicines, including NSAIDs. Once you have determined your asthma triggers, take steps to avoid them. Depending on your triggers, you may be able to reduce the chance of an asthma attack by:  Keeping your home clean. Have someone dust and vacuum your home for you 1 or 2 times a week. If possible, have them use a high-efficiency particulate arrestance (HEPA) vacuum.  Washing your sheets weekly in hot water.  Using allergy-proof mattress covers and casings on your bed.  Keeping pets out of your home.  Taking care of mold and water problems in your home.  Avoiding areas where people smoke.  Avoiding using strong perfumes or odor sprays.  Avoid spending a lot of time outdoors when pollen counts are high and on very windy days.  Talking with your health care provider before stopping or starting any new medicines. Medicines Take over-the-counter and prescription medicines only as told by your health care provider. Many asthma attacks can be prevented by carefully following your medicine schedule. Taking your medicines correctly is especially important when you cannot avoid certain asthma triggers. Even if you are doing well, do not stop taking your medicine and do not take less medicine. Act quickly If an asthma attack happens, acting quickly can decrease how severe it is and   how long it lasts. Take these actions:  Pay attention to your symptoms. If you are coughing, wheezing, or having difficulty breathing, do not wait to see if your symptoms go away on their own. Follow your asthma action plan.  If you have followed your asthma action plan and your symptoms are not improving, call your health care  provider or seek immediate medical care at the nearest hospital. It is important to write down how often you need to use your fast-acting rescue inhaler. You can track how often you use an inhaler in your journal. If you are using your rescue inhaler more often, it may mean that your asthma is not under control. Adjusting your asthma treatment plan may help you to prevent future asthma attacks and help you to gain better control of your condition. How can I prevent an asthma attack when I exercise? Exercise is a common asthma trigger. To prevent asthma attacks during exercise:  Follow advice from your health care provider about whether you should use your fast-acting inhaler before exercising. Many people with asthma experience exercise-induced bronchoconstriction (EIB). This condition often worsens during vigorous exercise in cold, humid, or dry environments. Usually, people with EIB can stay very active by using a fast-acting inhaler before exercising.  Avoid exercising outdoors in very cold or humid weather.  Avoid exercising outdoors when pollen counts are high.  Warm up and cool down when exercising.  Stop exercising right away if asthma symptoms start. Consider taking part in exercises that are less likely to cause asthma symptoms such as:  Indoor swimming.  Biking.  Walking.  Hiking.  Playing football. This information is not intended to replace advice given to you by your health care provider. Make sure you discuss any questions you have with your health care provider. Document Revised: 12/17/2016 Document Reviewed: 06/21/2015 Elsevier Patient Education  2020 [place] for Gastroesophageal Reflux Disease, Adult When you have gastroesophageal reflux disease (GERD), the foods you eat and your eating habits are very important. Choosing the right foods can help ease the discomfort of GERD. Consider working with a diet and nutrition specialist (dietitian) to help  you make healthy food choices. What general guidelines should I follow?  Eating plan  Choose healthy foods low in fat, such as fruits, vegetables, whole grains, low-fat dairy products, and lean meat, fish, and poultry.  Eat frequent, small meals instead of three large meals each day. Eat your meals slowly, in a relaxed setting. Avoid bending over or lying down until 2-3 hours after eating.  Limit high-fat foods such as fatty meats or fried foods.  Limit your intake of oils, butter, and shortening to less than 8 teaspoons each day.  Avoid the following: ? Foods that cause symptoms. These may be different for different people. Keep a food diary to keep track of foods that cause symptoms. ? Alcohol. ? Drinking large amounts of liquid with meals. ? Eating meals during the 2-3 hours before bed.  Cook foods using methods other than frying. This may include baking, grilling, or broiling. Lifestyle  Maintain a healthy weight. Ask your health care provider what weight is healthy for you. If you need to lose weight, work with your health care provider to do so safely.  Exercise for at least 30 minutes on 5 or more days each week, or as told by your health care provider.  Avoid wearing clothes that fit tightly around your waist and chest.  Do not use any products that  contain nicotine or tobacco, such as cigarettes and e-cigarettes. If you need help quitting, ask your health care provider.  Sleep with the head of your bed raised. Use a wedge under the mattress or blocks under the bed frame to raise the head of the bed. What foods are not recommended? The items listed may not be a complete list. Talk with your dietitian about what dietary choices are best for you. Grains Pastries or quick breads with added fat. [place] toast. Vegetables Deep fried vegetables. [place] fries. Any vegetables prepared with added fat. Any vegetables that cause symptoms. For some people this may include tomatoes and  tomato products, chili peppers, onions and garlic, and horseradish. Fruits Any fruits prepared with added fat. Any fruits that cause symptoms. For some people this may include citrus fruits, such as oranges, grapefruit, pineapple, and lemons. Meats and other protein foods High-fat meats, such as fatty beef or pork, hot dogs, ribs, ham, sausage, salami and bacon. Fried meat or protein, including fried fish and fried chicken. Nuts and nut butters. Dairy Whole milk and chocolate milk. Sour cream. Cream. Ice cream. Cream cheese. Milk shakes. Beverages Coffee and tea, with or without caffeine. Carbonated beverages. Sodas. Energy drinks. Fruit juice made with acidic fruits (such as orange or grapefruit). Tomato juice. Alcoholic drinks. Fats and oils Butter. Margarine. Shortening. Ghee. Sweets and desserts Chocolate and cocoa. Donuts. Seasoning and other foods Pepper. Peppermint and spearmint. Any condiments, herbs, or seasonings that cause symptoms. For some people, this may include curry, hot sauce, or vinegar-based salad dressings. Summary  When you have gastroesophageal reflux disease (GERD), food and lifestyle choices are very important to help ease the discomfort of GERD.  Eat frequent, small meals instead of three large meals each day. Eat your meals slowly, in a relaxed setting. Avoid bending over or lying down until 2-3 hours after eating.  Limit high-fat foods such as fatty meat or fried foods. This information is not intended to replace advice given to you by your health care provider. Make sure you discuss any questions you have with your health care provider. Document Revised: 04/27/2018 Document Reviewed: 01/06/2016 Elsevier Patient Education  [place].  Dysphagia  Dysphagia is trouble swallowing. This condition occurs when solids and liquids stick in a person's throat on the way down to the stomach, or when food takes longer to get to the stomach than usual. You may  have problems swallowing food, liquids, or both. You may also have pain while trying to swallow. It may take you more time and effort to swallow something. What are the causes? This condition may be caused by:  Muscle problems. They may make it difficult for you to move food and liquids through the esophagus, which is the tube that connects your mouth to your stomach.  Blockages. You may have ulcers, scar tissue, or inflammation that blocks the normal passage of food and liquids. Causes of these problems include: ? Acid reflux from your stomach into your esophagus (gastroesophageal reflux). ? Infections. ? Radiation treatment for cancer. ? Medicines taken without enough fluids to wash them down into your stomach.  Stroke. This can affect the nerves and make it difficult to swallow.  Nerve problems. These prevent signals from being sent to the muscles of your esophagus to squeeze (contract) and move what you swallow down to your stomach.  Globus pharyngeus. This is a common problem that involves a feeling like something is stuck in your throat or a sense of trouble with  swallowing, even though nothing is wrong with the swallowing passages.  Certain conditions, such as cerebral palsy or Parkinson's disease. What are the signs or symptoms? Common symptoms of this condition include:  A feeling that solids or liquids are stuck in your throat on the way down to the stomach.  Pain while swallowing.  Coughing or gagging while trying to swallow. Other symptoms include:  Food moving back from your stomach to your mouth (regurgitation).  Noises coming from your throat.  Chest discomfort with swallowing.  A feeling of fullness when swallowing.  Drooling, especially when the throat is blocked.  Heartburn. How is this diagnosed? This condition may be diagnosed by:  Barium X-ray. In this test, you will swallow a white liquid that sticks to the inside of your esophagus. X-ray images are  then taken.  Endoscopy. In this test, a flexible telescope is inserted down your throat to look at your esophagus and your stomach.  CT scans and an MRI. How is this treated? Treatment for dysphagia depends on the cause of this condition, such as:  If the dysphagia is caused by acid reflux or infection, medicines may be used. They may include antibiotics and heartburn medicines.  If the dysphagia is caused by problems with the muscles, swallowing therapy may be used to help you strengthen your swallowing muscles. You may have to do specific exercises to strengthen the muscles or stretch them.  If the dysphagia is caused by a blockage or mass, procedures to remove the blockage may be done. You may need surgery and a feeding tube. You may need to make diet changes. Ask your health care provider for specific instructions. Follow these instructions at home: Medicines  Take over-the-counter and prescription medicines only as told by your health care provider.  If you were prescribed an antibiotic medicine, take it as told by your health care provider. Do not stop taking the antibiotic even if you start to feel better. Eating and drinking   Follow any diet changes as told by your health care provider.  Work with a diet and nutrition specialist (dietitian) to create an eating plan that will help you get the nutrients you need in order to stay healthy.  Eat soft foods that are easier to swallow.  Cut your food into small pieces and eat slowly. Take small bites.  Eat and drink only when you are sitting upright.  Do not drink alcohol or caffeine. If you need help quitting, ask your health care provider. General instructions  Check your weight every day to make sure you are not losing weight.  Do not use any products that contain nicotine or tobacco, such as cigarettes, e-cigarettes, and chewing tobacco. If you need help quitting, ask your health care provider.  Keep all follow-up visits  as told by your health care provider. This is important. Contact a health care provider if you:  Lose weight because you cannot swallow.  Cough when you drink liquids.  Cough up partially digested food. Get help right away if you:  Cannot swallow your saliva.  Have shortness of breath, a fever, or both.  Have a hoarse voice and also have trouble swallowing. Summary  Dysphagia is trouble swallowing. This condition occurs when solids and liquids stick in a person's throat on the way down to the stomach. You may cough or gag while trying to swallow.  Dysphagia has many possible causes.  Treatment for dysphagia depends on the cause of the condition.  Keep all follow-up visits  as told by your health care provider. This is important. This information is not intended to replace advice given to you by your health care provider. Make sure you discuss any questions you have with your health care provider. Document Revised: 05/31/2018 Document Reviewed: 05/31/2018 Elsevier Patient Education  [place].

## 2019-04-30 ENCOUNTER — Encounter: Payer: Self-pay | Admitting: Physical Therapy

## 2019-04-30 ENCOUNTER — Ambulatory Visit: Payer: PPO | Attending: Surgery | Admitting: Physical Therapy

## 2019-04-30 ENCOUNTER — Other Ambulatory Visit: Payer: Self-pay

## 2019-04-30 ENCOUNTER — Ambulatory Visit: Payer: PPO | Admitting: Radiation Oncology

## 2019-04-30 DIAGNOSIS — C50511 Malignant neoplasm of lower-outer quadrant of right female breast: Secondary | ICD-10-CM | POA: Diagnosis not present

## 2019-04-30 DIAGNOSIS — Z483 Aftercare following surgery for neoplasm: Secondary | ICD-10-CM | POA: Diagnosis not present

## 2019-04-30 DIAGNOSIS — Z170 Estrogen receptor positive status [ER+]: Secondary | ICD-10-CM | POA: Diagnosis not present

## 2019-04-30 DIAGNOSIS — R293 Abnormal posture: Secondary | ICD-10-CM | POA: Insufficient documentation

## 2019-04-30 NOTE — Therapy (Signed)
[place], [place], [address] Phone: [phone]   Fax:  [phone]  Physical Therapy Treatment  Patient Details  Name: Cheryl Knight MRN: [id number] Date of Birth: 11/16/1955 Referring Provider (PT): Dr. Donnie Mesa   Encounter Date: 04/30/2019  PT End of Session - 04/30/19 1413    Visit Number  2    Number of Visits  2    PT Start Time  1340    PT Stop Time  1413    PT Time Calculation (min)  33 min    Activity Tolerance  Patient tolerated treatment well    Behavior During Therapy  [facility] for tasks assessed/performed       Past Medical History:  Diagnosis Date  . Cancer ([place]) 02/2019   right breast IDC  . Cataracts, bilateral    tbd surgery 2021  . Complication of anesthesia   . Cough   . Family history of breast cancer   . Family history of lung cancer   . Family history of prostate cancer   . Family history of skin cancer   . GERD (gastroesophageal reflux disease)   . Headache   . High cholesterol   . Hypertension   . Hypothyroidism   . Irritable bowel   . Osteoarthritis   . Osteoarthritis    i.e right knee   . PONV (postoperative nausea and vomiting)   . Pre-diabetes   . Rheumatoid arthritis ([place])   . Thyroid disease     Past Surgical History:  Procedure Laterality Date  . ABDOMINAL HYSTERECTOMY     age 67 ovaries out  . BREAST LUMPECTOMY WITH RADIOACTIVE SEED AND SENTINEL LYMPH NODE BIOPSY Right 03/28/2019   Procedure: RIGHT BREAST LUMPECTOMY WITH RADIOACTIVE SEED AND SENTINEL LYMPH NODE BIOPSY;  Surgeon: Donnie Mesa, MD;  Location: [facility];  Service: General;  Laterality: Right;  PEC BLOCK FOR POST OP PAIN  . CATARACT EXTRACTION, BILATERAL    . ESOPHAGEAL DILATION     Dr. Olevia Perches 15 years from 2021  . TONSILLECTOMY    . TUBAL LIGATION      There were no vitals filed for this visit.  Subjective Assessment - 04/30/19 1346    Subjective  Patient  underwent a right lumpectomy and sentinel node biopsy (2 negative nodes) on 03/28/2019. She is scheduled to begin radiation and will undergo anti-estrogen therapy.    Pertinent History  Patient was diagnosed on 02/22/2019 with right invasive ductal carcinoma breast cancer. Patient underwent a right lumpectomy and sentinel node biopsy (2 negative nodes) on 03/28/2019. It is ER/PR positive and HER2 negative with a Ki67 of 10%. She has rheumatoid arthritis.    Patient Stated Goals  See if my arm is back to baseline    Currently in Pain?  No/denies         [facility] PT Assessment - 04/30/19 0001      Assessment   Medical Diagnosis  s/p right lumpectomy and SLNB    Referring Provider (PT)  Dr. Donnie Mesa    Onset Date/Surgical Date  03/28/19    Hand Dominance  Right    Prior Therapy  Baselines      Precautions   Precautions  Other (comment)    Precaution Comments  recent surgey and right lymphedema risk      Restrictions   Weight Bearing Restrictions  No      Balance Screen   Has the patient fallen in the past 6  months  No    Has the patient had a decrease in activity level because of a fear of falling?   No    Is the patient reluctant to leave their home because of a fear of falling?   No      Home Environment   Living Environment  Private residence    Living Arrangements  Spouse/significant other    Available Help at Discharge  Family      Prior Function   Level of Independence  Independent    Vocation  Part time employment    Vocation Requirements  not currently working    Leisure  Walking 3x/week for 20 min      Cognition   Overall Cognitive Status  Within Functional Limits for tasks assessed      Observation/Other Assessments   Observations  Breast and axillary incisions both appear to be well healed. Mild edema present right inferior lateral breast.       Posture/Postural Control   Posture/Postural Control  Postural limitations    Postural Limitations  Rounded  Shoulders;Forward head      ROM / Strength   AROM / PROM / Strength  AROM      AROM   AROM Assessment Site  Shoulder    Right/Left Shoulder  Right    Right Shoulder Extension  57 Degrees    Right Shoulder Flexion  150 Degrees    Right Shoulder ABduction  154 Degrees    Right Shoulder Internal Rotation  58 Degrees    Right Shoulder External Rotation  70 Degrees        LYMPHEDEMA/ONCOLOGY QUESTIONNAIRE - 04/30/19 1353      Type   Cancer Type  Right breast cancer      Surgeries   Lumpectomy Date  03/28/19    Sentinel Lymph Node Biopsy Date  03/28/19    Number Lymph Nodes Removed  2      Treatment   Active Chemotherapy Treatment  No    Past Chemotherapy Treatment  No    Active Radiation Treatment  No    Past Radiation Treatment  No    Current Hormone Treatment  No    Past Hormone Therapy  No      What other symptoms do you have   Are you Having Heaviness or Tightness  No    Are you having Pain  No    Are you having pitting edema  No    Is it Hard or Difficult finding clothes that fit  No    Do you have infections  No    Is there Decreased scar mobility  No    Stemmer Sign  No      Lymphedema Assessments   Lymphedema Assessments  Upper extremities      Right Upper Extremity Lymphedema   10 cm Proximal to Olecranon Process  33.6 cm    Olecranon Process  28.4 cm    10 cm Proximal to Ulnar Styloid Process  25.7 cm    Just Proximal to Ulnar Styloid Process  18.7 cm    Across Hand at [employer]  20.9 cm    At [place] of 2nd Digit  7.5 cm      Left Upper Extremity Lymphedema   10 cm Proximal to Olecranon Process  34.8 cm    Olecranon Process  29.5 cm    10 cm Proximal to Ulnar Styloid Process  25.9 cm    Just Proximal to Ulnar Styloid  Process  18.5 cm    Across Hand at [employer]  20.4 cm    At [place] of 2nd Digit  7.1 cm        Katina Dung - 04/30/19 0001    Open a tight or new jar  Mild difficulty    Do heavy household chores (wash walls, wash floors)   Mild difficulty    Carry a shopping bag or briefcase  No difficulty    Wash your back  Mild difficulty    Use a knife to cut food  No difficulty    Recreational activities in which you take some force or impact through your arm, shoulder, or hand (golf, hammering, tennis)  No difficulty    During the past week, to what extent has your arm, shoulder or hand problem interfered with your normal social activities with family, friends, neighbors, or groups?  Not at all    During the past week, to what extent has your arm, shoulder or hand problem limited your work or other regular daily activities  Quite a bit    Arm, shoulder, or hand pain.  None    Tingling (pins and needles) in your arm, shoulder, or hand  Mild    Difficulty Sleeping  No difficulty    DASH Score  15.91 %                     PT Education - 04/30/19 1406    Education Details  Reviewed post op HEP and lymphedema risk reduction; scar massage using coconut oil    Person(s) Educated  Patient    Methods  Explanation    Comprehension  Verbalized understanding          PT Long Term Goals - 04/30/19 1415      PT LONG TERM GOAL #1   Title  Patient will demonstrate she has regained full shoulder ROM and function post operatively compared to baselines.    Time  8    Period  Weeks    Status  Partially Met            Plan - 04/30/19 1413    Clinical Impression Statement  Patient is doing very well s/p right lumpectomy and sentinel node biopsy. She has regained full shoulder ROM (only lacking 7 degrees abduction), has no signs of lymphedema, and her incisions appear to be well healed. She plans to begin radiation next week and participate in the After Breast Cancer class to be educated on lymphedema risk reduction. She has no further PT needs at this time.    PT Treatment/Interventions  ADLs/Self Care Home Management;Therapeutic exercise;Patient/family education    PT Next Visit Plan  D/C    PT Home Exercise  Plan  Post op shoulder ROM HEP    Consulted and Agree with Plan of Care  Patient       Patient will benefit from skilled therapeutic intervention in order to improve the following deficits and impairments:  Postural dysfunction, Decreased range of motion, Pain, Impaired UE functional use, Decreased knowledge of precautions  Visit Diagnosis: Malignant neoplasm of lower-outer quadrant of right breast of female, estrogen receptor positive (HCC)  Abnormal posture  Aftercare following surgery for neoplasm     Problem List Patient Active Problem List   Diagnosis Date Noted  . Family history of breast cancer   . Family history of prostate cancer   . Family history of skin cancer   . Family history of  lung cancer   . Malignant neoplasm of lower-outer quadrant of right breast of female, estrogen receptor positive ([place]) 03/19/2019  . Type 2 diabetes mellitus without complication, without long-term current use of insulin ([place]) 02/08/2019  . Allergic rhinitis 02/08/2019  . Osteoarthritis   . Primary osteoarthritis involving multiple joints 11/15/2017  . Moderate asthma with acute exacerbation 05/03/2017  . Routine general medical examination at a health care facility 02/08/2017  . Primary osteoarthritis of both feet 02/10/2016  . Hx of migraine headaches 02/07/2016  . History of hypothyroidism 02/07/2016  . History of gastroesophageal reflux (GERD) 02/07/2016  . Obese 12/30/2015  . Basal cell carcinoma of chest wall 10/05/2011  . Hypertension 09/01/2010  . Hyperlipidemia 02/27/2008  . Hypothyroidism 02/21/2007  . Migraine 02/21/2007  . VAGINITIS, ATROPHIC 02/21/2007  . Psoriasis 02/21/2007  . Sicca syndrome, unspecified ([place]) 02/21/2007  . GERD 06/29/2006  . Rheumatoid arthritis ([place]) 06/29/2006   PHYSICAL THERAPY DISCHARGE SUMMARY  Visits from Start of Care: 2  Current functional level related to goals / functional outcomes: Goals met except lacking 7 degrees right shoulder  abduction. See above for objective findings.   Remaining deficits: None   Education / Equipment: Lymphedema risk reduction and shoulder ROM HEP Plan: Patient agrees to discharge.  Patient goals were partially met. Patient is being discharged due to being pleased with the current functional level.  ?????        Annia Friendly, [place] 04/30/19 2:16 PM  [place], [place], [address] Phone: [phone]   Fax:  [phone]  Name: Cheryl Knight MRN: [id number] Date of Birth: 19-Nov-1955

## 2019-05-01 ENCOUNTER — Ambulatory Visit
Admission: RE | Admit: 2019-05-01 | Discharge: 2019-05-01 | Disposition: A | Discharge status: Home / Self Care | Payer: PPO | Source: Ambulatory Visit | Attending: Radiation Oncology | Admitting: Radiation Oncology

## 2019-05-01 ENCOUNTER — Other Ambulatory Visit: Payer: Self-pay

## 2019-05-01 DIAGNOSIS — Z510 Encounter for antineoplastic radiation therapy: Secondary | ICD-10-CM | POA: Insufficient documentation

## 2019-05-01 DIAGNOSIS — Z170 Estrogen receptor positive status [ER+]: Secondary | ICD-10-CM | POA: Insufficient documentation

## 2019-05-01 DIAGNOSIS — C50511 Malignant neoplasm of lower-outer quadrant of right female breast: Secondary | ICD-10-CM | POA: Diagnosis not present

## 2019-05-02 ENCOUNTER — Telehealth: Payer: Self-pay | Admitting: Genetic Counselor

## 2019-05-02 ENCOUNTER — Encounter: Payer: Self-pay | Admitting: Genetic Counselor

## 2019-05-02 ENCOUNTER — Ambulatory Visit: Payer: Self-pay | Admitting: Genetic Counselor

## 2019-05-02 DIAGNOSIS — Z1379 Encounter for other screening for genetic and chromosomal anomalies: Secondary | ICD-10-CM

## 2019-05-02 NOTE — Telephone Encounter (Signed)
Revealed negative genetic testing.  Discussed that we do not know why she has breast cancer or why there is cancer in the family.  There could be a genetic mutation in the family that Cheryl Knight did not inherit.  There could also be a mutation in a different gene that we are not testing, or our current technology may not be able detect certain mutations.  It will therefore be important for her to keep in contact with genetics to keep up with whether additional testing may be appropriate in the future.

## 2019-05-02 NOTE — Progress Notes (Signed)
HPI:  Ms. Snare was previously seen in the [facility] due to a personal and family history of cancer and concerns regarding a hereditary predisposition to cancer. Please refer to our prior cancer genetics clinic note for more information regarding our discussion, assessment and recommendations, at the time. Ms. Bare recent genetic test results were disclosed to her, as were recommendations warranted by these results. These results and recommendations are discussed in more detail below.  CANCER HISTORY:  Oncology History Overview Note  Cancer Staging Malignant neoplasm of lower-outer quadrant of right breast of female, estrogen receptor positive ([place]) Staging form: Breast, AJCC 8th Edition - Clinical stage from 03/14/2019: Stage IA (cT1a, cN0, cM0, G1, ER+, PR+, HER2-) - Signed by Truitt Merle, MD on 03/20/2019    Malignant neoplasm of lower-outer quadrant of right breast of female, estrogen receptor positive ([place])  02/22/2019 Imaging   Bone Density Scan  02/22/19 DEXA shows osteopenia with lowest T-score -1.4 at left hip   03/09/2019 Mammogram   Diagnostic Mammogram 03/09/19 IMPRESSION the 16m mass in 7:00 posiiton of right breast suspicous of malignancy.     03/14/2019 Cancer Staging   Staging form: Breast, AJCC 8th Edition - Clinical stage from 03/14/2019: Stage IA (cT1a, cN0, cM0, G1, ER+, PR+, HER2-) - Signed by [name] MD on 03/20/2019   03/14/2019 Initial Biopsy   Diagnosis 03/14/19  Breast, right, needle core biopsy, 7 o'clock, 5cmfn - INVASIVE DUCTAL CARCINOMA. SEE NOTE Diagnosis Note Carcinoma measures 0.4 cm in greatest linear dimension and appears grade 1. Dr. CJeannie Donereviewed the case and concurs with the diagnosis. A breast prognostic profile (ER, PR, Ki-67 and HER2) is pending and will be reported in an addendum. Dr. BIsaiah Blakeswas notified on 11/12/2019.   03/14/2019 Receptors her2   PROGNOSTIC INDICATORS Results: IMMUNOHISTOCHEMICAL AND MORPHOMETRIC  ANALYSIS PERFORMED MANUALLY The tumor cells are EQUIVOCAL for Her2 (2+). Her2 by FISH will be performed and results reported separately. Estrogen Receptor: 95%, POSITIVE, STRONG STAINING INTENSITY Progesterone Receptor: 90%, POSITIVE, MODERATE STAINING INTENSITY Proliferation Marker Ki67: 10%   03/19/2019 Initial Diagnosis   Malignant neoplasm of lower-outer quadrant of right breast of female, estrogen receptor positive ([place]   04/26/2019 Genetic Testing   Negative genetic testing:  No pathogenic variants detected on the [facility] Common Hereditary Cancers panel. The report date is 04/26/2019.  The Common Hereditary Cancers Panel offered by [facility] includes sequencing and/or deletion duplication testing of the following 48 genes: APC, ATM, AXIN2, BARD1, BMPR1A, BRCA1, BRCA2, BRIP1, CDH1, CDK4, CDKN2A (p14ARF), CDKN2A (p16INK4a), CHEK2, CTNNA1, DICER1, EPCAM (Deletion/duplication testing only), GREM1 (promoter region deletion/duplication testing only), KIT, MEN1, MLH1, MSH2, MSH3, MSH6, MUTYH, NBN, NF1, NHTL1, PALB2, PDGFRA, PMS2, POLD1, POLE, PTEN, RAD50, RAD51C, RAD51D, RNF43, SDHB, SDHC, SDHD, SMAD4, SMARCA4. STK11, TP53, TSC1, TSC2, and VHL.  The following genes were evaluated for sequence changes only: SDHA and HOXB13 c.251G>A variant only.      FAMILY HISTORY:  We obtained a detailed, 4-generation family history.  Significant diagnoses are listed below: Family History  Problem Relation Age of Onset  . Hypertension Mother   . Thyroid disease Mother   . Breast cancer Mother 868      breast dx age 6444died age 648  . Prostate cancer Father 553      metastatic  . Stroke Maternal Grandmother   . Skin cancer Maternal Uncle        dx. in his 716s . Lung cancer Maternal Uncle  smoker, died age 51   Ms. Philyaw has two sons (ages 103 and 84), and four grandsons. She also has two sisters (ages 67 and 69). None of these family members have had cancer.  Ms. Will mother died at age 59  and had a history of breast cancer diagnosed at age 56 and basal cell carcinoma when she was 37. Ms. Mcauliffe had three maternal uncles. One uncle is living at age 79, one uncle died at age 36 and had a history of skin cancer in his 60s, and the third uncle died at age 37 from lung cancer and was a smoker. One of her maternal cousins died from melanoma when he was 31. Ms. Rudden maternal grandmother died in her 10s from a stroke after having multiple strokes, and her maternal grandfather died at age 63 in his sleep.  Ms. Milhoan father died at age 35 from metastatic prostate cancer that was initially diagnosed at age 51. Her father was adopted, and so Ms. Piscitelli has limited information about her paternal side of the family. She knows that her father had two sisters, one who died at age 35 and one who died at an unknown age, but when she was elderly. Her paternal grandmother likely died when she was older than 47 from either a heart attack or a stroke. She does not have any information about her paternal grandfather.  Ms. Sulton is unaware of previous family history of genetic testing for hereditary cancer risks. She does not know her ancestry. There is no reported Ashkenazi Jewish ancestry. There is no known consanguinity.  GENETIC TEST RESULTS: Genetic testing reported out on 04/26/2019 through the [facility] Common Hereditary Cancers panel. No pathogenic variants were detected.   The Common Hereditary Cancers Panel offered by [facility] includes sequencing and/or deletion duplication testing of the following 48 genes: APC, ATM, AXIN2, BARD1, BMPR1A, BRCA1, BRCA2, BRIP1, CDH1, CDK4, CDKN2A (p14ARF), CDKN2A (p16INK4a), CHEK2, CTNNA1, DICER1, EPCAM (Deletion/duplication testing only), GREM1 (promoter region deletion/duplication testing only), KIT, MEN1, MLH1, MSH2, MSH3, MSH6, MUTYH, NBN, NF1, NHTL1, PALB2, PDGFRA, PMS2, POLD1, POLE, PTEN, RAD50, RAD51C, RAD51D, RNF43, SDHB, SDHC, SDHD, SMAD4, SMARCA4.  STK11, TP53, TSC1, TSC2, and VHL.  The following genes were evaluated for sequence changes only: SDHA and HOXB13 c.251G>A variant only. The test report will be scanned into EPIC and located under the Molecular Pathology section of the Results Review tab.  A portion of the result report is included below for reference.     We discussed with Ms. Kreher that because current genetic testing is not perfect, it is possible there may be a gene mutation in one of these genes that current testing cannot detect, but that chance is small.  We also discussed, that there could be another gene that has not yet been discovered, or that we have not yet tested, that is responsible for the cancer diagnoses in the family. It is also possible there is a hereditary cause for the cancer in the family that Ms. Hobson did not inherit and therefore was not identified in her testing.  Therefore, it is important to remain in touch with cancer genetics in the future so that we can continue to offer Ms. Hirt the most up to date genetic testing.   CANCER SCREENING RECOMMENDATIONS: Ms. Spadafora test result is considered negative (normal).  This means that we have not identified a hereditary cause for her personal and family history of cancer at this time. Most cancers happen by chance and this negative test  suggests that her personal and family of cancer may fall into this category.    While reassuring, this does not definitively rule out a hereditary predisposition to cancer. It is still possible that there could be genetic mutations that are undetectable by current technology. There could be genetic mutations in genes that have not been tested or identified to increase cancer risk.  Therefore, it is recommended she continue to follow the cancer management and screening guidelines provided by her oncology and primary healthcare providers.   An individual's cancer risk and medical management are not determined by genetic test  results alone. Overall cancer risk assessment incorporates additional factors, including personal medical history, family history, and any available genetic information that may result in a personalized plan for cancer prevention and surveillance.  RECOMMENDATIONS FOR FAMILY MEMBERS:  Individuals in this family might be at some increased risk of developing cancer, over the general population risk, simply due to the family history of cancer.  We recommended women in this family have a yearly mammogram beginning at age 62, or 8 years younger than the earliest onset of cancer, an annual clinical breast exam, and perform monthly breast self-exams. Women in this family should also have a gynecological exam as recommended by their primary provider. All family members should have a colonoscopy by age 89.  It is also possible there is a hereditary cause for the cancer in Ms. Krog's family that she did not inherit and therefore was not identified in her.  Based on Ms. Speranza's family history of metastatic prostate cancer in her father, we recommended her sisters have genetic counseling and testing. Ms. Hassey will let us know if we can be of any assistance in coordinating genetic counseling and/or testing for this family member.   FOLLOW-UP: Lastly, we discussed with Ms. Longanecker that cancer genetics is a rapidly advancing field and it is possible that new genetic tests will be appropriate for her and/or her family members in the future. We encouraged her to remain in contact with cancer genetics on an annual basis so we can update her personal and family histories and let her know of advances in cancer genetics that may benefit this family.   Our contact number was provided. Ms. Heady questions were answered to her satisfaction, and she knows she is welcome to call us at anytime with additional questions or concerns.   Clint Guy, [place], [facility] Licensed, Certified [email or web address] Phone: [phone]

## 2019-05-05 ENCOUNTER — Other Ambulatory Visit: Payer: Self-pay | Admitting: Rheumatology

## 2019-05-07 ENCOUNTER — Other Ambulatory Visit: Payer: Self-pay | Admitting: Internal Medicine

## 2019-05-07 DIAGNOSIS — Z510 Encounter for antineoplastic radiation therapy: Secondary | ICD-10-CM | POA: Diagnosis not present

## 2019-05-07 DIAGNOSIS — E785 Hyperlipidemia, unspecified: Secondary | ICD-10-CM

## 2019-05-07 DIAGNOSIS — C50511 Malignant neoplasm of lower-outer quadrant of right female breast: Secondary | ICD-10-CM | POA: Diagnosis not present

## 2019-05-07 DIAGNOSIS — I10 Essential (primary) hypertension: Secondary | ICD-10-CM

## 2019-05-07 MED ORDER — PRAVASTATIN SODIUM 40 MG PO TABS: 40.0000 mg | ORAL_TABLET | Freq: Every day | ORAL | 3 refills | Status: DC

## 2019-05-07 NOTE — Telephone Encounter (Signed)
Last Visit:04/05/19 Next Visit:07/10/19  Okay to refill per Dr. Estanislado Pandy

## 2019-05-08 ENCOUNTER — Ambulatory Visit
Admission: RE | Admit: 2019-05-08 | Discharge: 2019-05-08 | Disposition: A | Discharge status: Home / Self Care | Payer: PPO | Source: Ambulatory Visit | Attending: Radiation Oncology | Admitting: Radiation Oncology

## 2019-05-08 ENCOUNTER — Encounter: Payer: Self-pay | Admitting: *Deleted

## 2019-05-08 ENCOUNTER — Telehealth: Payer: Self-pay | Admitting: Hematology

## 2019-05-08 ENCOUNTER — Other Ambulatory Visit: Payer: Self-pay

## 2019-05-08 DIAGNOSIS — C50511 Malignant neoplasm of lower-outer quadrant of right female breast: Secondary | ICD-10-CM | POA: Diagnosis not present

## 2019-05-08 DIAGNOSIS — Z510 Encounter for antineoplastic radiation therapy: Secondary | ICD-10-CM | POA: Diagnosis not present

## 2019-05-08 NOTE — Telephone Encounter (Signed)
R/s appt to correct provider per 4/19 sch message - mailed letter with new appt date and time

## 2019-05-09 ENCOUNTER — Telehealth: Payer: Self-pay | Admitting: Rheumatology

## 2019-05-09 ENCOUNTER — Ambulatory Visit
Admission: RE | Admit: 2019-05-09 | Discharge: 2019-05-09 | Disposition: A | Discharge status: Home / Self Care | Payer: PPO | Source: Ambulatory Visit | Attending: Radiation Oncology | Admitting: Radiation Oncology

## 2019-05-09 ENCOUNTER — Other Ambulatory Visit: Payer: Self-pay

## 2019-05-09 DIAGNOSIS — Z510 Encounter for antineoplastic radiation therapy: Secondary | ICD-10-CM | POA: Diagnosis not present

## 2019-05-09 DIAGNOSIS — C50511 Malignant neoplasm of lower-outer quadrant of right female breast: Secondary | ICD-10-CM | POA: Diagnosis not present

## 2019-05-09 NOTE — Telephone Encounter (Signed)
Patient left a voicemail stating she started a new medication and had her initial labwork and the repeat labwork 2 weeks later.  Patient states she is due to have labwork again 2 months after her last labs.  Patient is requesting a return call to confirm the date for the labwork.

## 2019-05-09 NOTE — Telephone Encounter (Signed)
Spoke with patient and she had labs at Dr. McLean-Scocuzza's office two weeks after starting arava.   Labs: 04/18/2019 glucose 128, eosinophils relative 5.2.  Advised patient she is now due for labs 2 months after starting. Patient will get labs in mid-late May 2021. Provided patient with our walk in lab hours and patient verbalized understanding.

## 2019-05-10 ENCOUNTER — Ambulatory Visit
Admission: RE | Admit: 2019-05-10 | Discharge: 2019-05-10 | Disposition: A | Discharge status: Home / Self Care | Payer: PPO | Source: Ambulatory Visit | Attending: Radiation Oncology | Admitting: Radiation Oncology

## 2019-05-10 ENCOUNTER — Other Ambulatory Visit: Payer: Self-pay

## 2019-05-10 DIAGNOSIS — C50511 Malignant neoplasm of lower-outer quadrant of right female breast: Secondary | ICD-10-CM | POA: Diagnosis not present

## 2019-05-10 DIAGNOSIS — Z510 Encounter for antineoplastic radiation therapy: Secondary | ICD-10-CM | POA: Diagnosis not present

## 2019-05-11 ENCOUNTER — Other Ambulatory Visit: Payer: Self-pay

## 2019-05-11 ENCOUNTER — Ambulatory Visit
Admission: RE | Admit: 2019-05-11 | Discharge: 2019-05-11 | Disposition: A | Discharge status: Home / Self Care | Payer: PPO | Source: Ambulatory Visit | Attending: Radiation Oncology | Admitting: Radiation Oncology

## 2019-05-11 DIAGNOSIS — C50511 Malignant neoplasm of lower-outer quadrant of right female breast: Secondary | ICD-10-CM | POA: Diagnosis not present

## 2019-05-11 DIAGNOSIS — Z510 Encounter for antineoplastic radiation therapy: Secondary | ICD-10-CM | POA: Diagnosis not present

## 2019-05-14 ENCOUNTER — Other Ambulatory Visit: Payer: Self-pay

## 2019-05-14 ENCOUNTER — Ambulatory Visit
Admission: RE | Admit: 2019-05-14 | Discharge: 2019-05-14 | Disposition: A | Discharge status: Home / Self Care | Payer: PPO | Source: Ambulatory Visit | Attending: Radiation Oncology | Admitting: Radiation Oncology

## 2019-05-14 DIAGNOSIS — C50511 Malignant neoplasm of lower-outer quadrant of right female breast: Secondary | ICD-10-CM | POA: Diagnosis not present

## 2019-05-14 DIAGNOSIS — Z510 Encounter for antineoplastic radiation therapy: Secondary | ICD-10-CM | POA: Diagnosis not present

## 2019-05-15 ENCOUNTER — Ambulatory Visit
Admission: RE | Admit: 2019-05-15 | Discharge: 2019-05-15 | Disposition: A | Discharge status: Home / Self Care | Payer: PPO | Source: Ambulatory Visit | Attending: Radiation Oncology | Admitting: Radiation Oncology

## 2019-05-15 ENCOUNTER — Other Ambulatory Visit: Payer: Self-pay

## 2019-05-15 DIAGNOSIS — C50511 Malignant neoplasm of lower-outer quadrant of right female breast: Secondary | ICD-10-CM | POA: Diagnosis not present

## 2019-05-15 DIAGNOSIS — Z510 Encounter for antineoplastic radiation therapy: Secondary | ICD-10-CM | POA: Diagnosis not present

## 2019-05-16 ENCOUNTER — Ambulatory Visit
Admission: RE | Admit: 2019-05-16 | Discharge: 2019-05-16 | Disposition: A | Discharge status: Home / Self Care | Payer: PPO | Source: Ambulatory Visit | Attending: Radiation Oncology | Admitting: Radiation Oncology

## 2019-05-16 ENCOUNTER — Other Ambulatory Visit: Payer: Self-pay

## 2019-05-16 ENCOUNTER — Ambulatory Visit: Payer: PPO | Admitting: Internal Medicine

## 2019-05-16 DIAGNOSIS — C50511 Malignant neoplasm of lower-outer quadrant of right female breast: Secondary | ICD-10-CM | POA: Diagnosis not present

## 2019-05-16 DIAGNOSIS — Z510 Encounter for antineoplastic radiation therapy: Secondary | ICD-10-CM | POA: Diagnosis not present

## 2019-05-17 ENCOUNTER — Other Ambulatory Visit: Payer: Self-pay

## 2019-05-17 ENCOUNTER — Ambulatory Visit
Admission: RE | Admit: 2019-05-17 | Discharge: 2019-05-17 | Disposition: A | Discharge status: Home / Self Care | Payer: PPO | Source: Ambulatory Visit | Attending: Radiation Oncology | Admitting: Radiation Oncology

## 2019-05-17 DIAGNOSIS — Z510 Encounter for antineoplastic radiation therapy: Secondary | ICD-10-CM | POA: Diagnosis not present

## 2019-05-17 DIAGNOSIS — C50511 Malignant neoplasm of lower-outer quadrant of right female breast: Secondary | ICD-10-CM | POA: Diagnosis not present

## 2019-05-17 NOTE — Progress Notes (Signed)
Pt here for patient teaching.  Pt given Radiation and You booklet, skin care instructions, Alra deodorant and Sonafine.  Reviewed areas of pertinence such as fatigue, hair loss, skin changes, breast tenderness and breast swelling . Pt able to give teach back of to pat skin and use unscented/gentle soap,apply Sonafine bid, avoid applying anything to skin within 4 hours of treatment, avoid wearing an under wire bra and to use an electric razor if they must shave. Pt verbalizes understanding of information given and will contact nursing with any questions or concerns.     Devita Nies M. Kaymarie Wynn RN, BSN             

## 2019-05-18 ENCOUNTER — Other Ambulatory Visit: Payer: Self-pay

## 2019-05-18 ENCOUNTER — Ambulatory Visit
Admission: RE | Admit: 2019-05-18 | Discharge: 2019-05-18 | Disposition: A | Discharge status: Home / Self Care | Payer: PPO | Source: Ambulatory Visit | Attending: Radiation Oncology | Admitting: Radiation Oncology

## 2019-05-18 DIAGNOSIS — Z510 Encounter for antineoplastic radiation therapy: Secondary | ICD-10-CM | POA: Diagnosis not present

## 2019-05-18 DIAGNOSIS — C50511 Malignant neoplasm of lower-outer quadrant of right female breast: Secondary | ICD-10-CM | POA: Diagnosis not present

## 2019-05-18 DIAGNOSIS — Z170 Estrogen receptor positive status [ER+]: Secondary | ICD-10-CM

## 2019-05-18 MED ORDER — SONAFINE EX EMUL
1.0000 "application " | Freq: Once | CUTANEOUS | Status: AC
  Administered 2019-05-18: 1 via TOPICAL

## 2019-05-18 MED ORDER — ALRA NON-METALLIC DEODORANT (RAD-ONC)
1.0000 "application " | Freq: Once | TOPICAL | Status: AC
  Administered 2019-05-18: 1 via TOPICAL

## 2019-05-21 ENCOUNTER — Other Ambulatory Visit: Payer: Self-pay

## 2019-05-21 ENCOUNTER — Ambulatory Visit
Admission: RE | Admit: 2019-05-21 | Discharge: 2019-05-21 | Disposition: A | Discharge status: Home / Self Care | Payer: PPO | Source: Ambulatory Visit | Attending: Radiation Oncology | Admitting: Radiation Oncology

## 2019-05-21 DIAGNOSIS — Z510 Encounter for antineoplastic radiation therapy: Secondary | ICD-10-CM | POA: Diagnosis not present

## 2019-05-21 DIAGNOSIS — Z170 Estrogen receptor positive status [ER+]: Secondary | ICD-10-CM | POA: Diagnosis not present

## 2019-05-21 DIAGNOSIS — C50511 Malignant neoplasm of lower-outer quadrant of right female breast: Secondary | ICD-10-CM | POA: Insufficient documentation

## 2019-05-22 ENCOUNTER — Other Ambulatory Visit: Payer: Self-pay

## 2019-05-22 ENCOUNTER — Ambulatory Visit: Payer: PPO

## 2019-05-22 ENCOUNTER — Ambulatory Visit
Admission: RE | Admit: 2019-05-22 | Discharge: 2019-05-22 | Disposition: A | Discharge status: Home / Self Care | Payer: PPO | Source: Ambulatory Visit | Attending: Radiation Oncology | Admitting: Radiation Oncology

## 2019-05-22 DIAGNOSIS — Z510 Encounter for antineoplastic radiation therapy: Secondary | ICD-10-CM | POA: Diagnosis not present

## 2019-05-22 DIAGNOSIS — C50511 Malignant neoplasm of lower-outer quadrant of right female breast: Secondary | ICD-10-CM | POA: Diagnosis not present

## 2019-05-23 ENCOUNTER — Other Ambulatory Visit: Payer: Self-pay

## 2019-05-23 ENCOUNTER — Ambulatory Visit
Admission: RE | Admit: 2019-05-23 | Discharge: 2019-05-23 | Disposition: A | Discharge status: Home / Self Care | Payer: PPO | Source: Ambulatory Visit | Attending: Radiation Oncology | Admitting: Radiation Oncology

## 2019-05-23 DIAGNOSIS — Z510 Encounter for antineoplastic radiation therapy: Secondary | ICD-10-CM | POA: Diagnosis not present

## 2019-05-23 DIAGNOSIS — C50511 Malignant neoplasm of lower-outer quadrant of right female breast: Secondary | ICD-10-CM | POA: Diagnosis not present

## 2019-05-24 ENCOUNTER — Other Ambulatory Visit: Payer: Self-pay

## 2019-05-24 ENCOUNTER — Ambulatory Visit
Admission: RE | Admit: 2019-05-24 | Discharge: 2019-05-24 | Disposition: A | Discharge status: Home / Self Care | Payer: PPO | Source: Ambulatory Visit | Attending: Radiation Oncology | Admitting: Radiation Oncology

## 2019-05-24 DIAGNOSIS — C50511 Malignant neoplasm of lower-outer quadrant of right female breast: Secondary | ICD-10-CM | POA: Diagnosis not present

## 2019-05-24 DIAGNOSIS — Z510 Encounter for antineoplastic radiation therapy: Secondary | ICD-10-CM | POA: Diagnosis not present

## 2019-05-25 ENCOUNTER — Ambulatory Visit
Admission: RE | Admit: 2019-05-25 | Discharge: 2019-05-25 | Disposition: A | Discharge status: Home / Self Care | Payer: PPO | Source: Ambulatory Visit | Attending: Radiation Oncology | Admitting: Radiation Oncology

## 2019-05-25 ENCOUNTER — Other Ambulatory Visit: Payer: Self-pay

## 2019-05-25 ENCOUNTER — Ambulatory Visit: Payer: PPO | Admitting: Radiation Oncology

## 2019-05-25 DIAGNOSIS — Z510 Encounter for antineoplastic radiation therapy: Secondary | ICD-10-CM | POA: Diagnosis not present

## 2019-05-25 DIAGNOSIS — C50511 Malignant neoplasm of lower-outer quadrant of right female breast: Secondary | ICD-10-CM | POA: Diagnosis not present

## 2019-05-28 ENCOUNTER — Other Ambulatory Visit: Payer: Self-pay

## 2019-05-28 ENCOUNTER — Ambulatory Visit
Admission: RE | Admit: 2019-05-28 | Discharge: 2019-05-28 | Disposition: A | Discharge status: Home / Self Care | Payer: PPO | Source: Ambulatory Visit | Attending: Radiation Oncology | Admitting: Radiation Oncology

## 2019-05-28 DIAGNOSIS — Z510 Encounter for antineoplastic radiation therapy: Secondary | ICD-10-CM | POA: Diagnosis not present

## 2019-05-28 DIAGNOSIS — C50511 Malignant neoplasm of lower-outer quadrant of right female breast: Secondary | ICD-10-CM | POA: Diagnosis not present

## 2019-05-29 ENCOUNTER — Ambulatory Visit
Admission: RE | Admit: 2019-05-29 | Discharge: 2019-05-29 | Disposition: A | Discharge status: Home / Self Care | Payer: PPO | Source: Ambulatory Visit | Attending: Radiation Oncology | Admitting: Radiation Oncology

## 2019-05-29 ENCOUNTER — Other Ambulatory Visit: Payer: Self-pay | Admitting: *Deleted

## 2019-05-29 ENCOUNTER — Other Ambulatory Visit: Payer: Self-pay

## 2019-05-29 DIAGNOSIS — Z79899 Other long term (current) drug therapy: Secondary | ICD-10-CM

## 2019-05-29 DIAGNOSIS — C50511 Malignant neoplasm of lower-outer quadrant of right female breast: Secondary | ICD-10-CM | POA: Diagnosis not present

## 2019-05-29 DIAGNOSIS — Z510 Encounter for antineoplastic radiation therapy: Secondary | ICD-10-CM | POA: Diagnosis not present

## 2019-05-30 ENCOUNTER — Other Ambulatory Visit: Payer: Self-pay

## 2019-05-30 ENCOUNTER — Ambulatory Visit: Payer: PPO

## 2019-05-30 ENCOUNTER — Ambulatory Visit
Admission: RE | Admit: 2019-05-30 | Discharge: 2019-05-30 | Disposition: A | Discharge status: Home / Self Care | Payer: PPO | Source: Ambulatory Visit | Attending: Radiation Oncology | Admitting: Radiation Oncology

## 2019-05-30 DIAGNOSIS — C50511 Malignant neoplasm of lower-outer quadrant of right female breast: Secondary | ICD-10-CM | POA: Diagnosis not present

## 2019-05-30 DIAGNOSIS — Z79899 Other long term (current) drug therapy: Secondary | ICD-10-CM | POA: Diagnosis not present

## 2019-05-30 DIAGNOSIS — Z510 Encounter for antineoplastic radiation therapy: Secondary | ICD-10-CM | POA: Diagnosis not present

## 2019-05-31 ENCOUNTER — Ambulatory Visit: Payer: PPO

## 2019-05-31 ENCOUNTER — Other Ambulatory Visit: Payer: Self-pay

## 2019-05-31 ENCOUNTER — Ambulatory Visit
Admission: RE | Admit: 2019-05-31 | Discharge: 2019-05-31 | Disposition: A | Discharge status: Home / Self Care | Payer: PPO | Source: Ambulatory Visit | Attending: Radiation Oncology | Admitting: Radiation Oncology

## 2019-05-31 DIAGNOSIS — Z510 Encounter for antineoplastic radiation therapy: Secondary | ICD-10-CM | POA: Diagnosis not present

## 2019-05-31 DIAGNOSIS — C50511 Malignant neoplasm of lower-outer quadrant of right female breast: Secondary | ICD-10-CM | POA: Diagnosis not present

## 2019-05-31 LAB — CBC WITH DIFFERENTIAL/PLATELET
Absolute Monocytes: 709 cells/uL (ref 200–950)
Basophils Absolute: 41 cells/uL (ref 0–200)
Basophils Relative: 0.8 %
Eosinophils Absolute: 230 cells/uL (ref 15–500)
Eosinophils Relative: 4.5 %
HCT: 46.9 % — ABNORMAL HIGH (ref 35.0–45.0)
Hemoglobin: 15.2 g/dL (ref 11.7–15.5)
Lymphs Abs: 1734 cells/uL (ref 850–3900)
MCH: 30.5 pg (ref 27.0–33.0)
MCHC: 32.4 g/dL (ref 32.0–36.0)
MCV: 94.2 fL (ref 80.0–100.0)
MPV: 10.1 fL (ref 7.5–12.5)
Monocytes Relative: 13.9 %
Neutro Abs: 2387 cells/uL (ref 1500–7800)
Neutrophils Relative %: 46.8 %
Platelets: 333 10*3/uL (ref 140–400)
RBC: 4.98 10*6/uL (ref 3.80–5.10)
RDW: 12.5 % (ref 11.0–15.0)
Total Lymphocyte: 34 %
WBC: 5.1 10*3/uL (ref 3.8–10.8)

## 2019-05-31 LAB — COMPLETE METABOLIC PANEL WITH GFR
AG Ratio: 1.7 (calc) (ref 1.0–2.5)
ALT: 21 U/L (ref 6–29)
AST: 25 U/L (ref 10–35)
Albumin: 4.5 g/dL (ref 3.6–5.1)
Alkaline phosphatase (APISO): 60 U/L (ref 37–153)
BUN: 12 mg/dL (ref 7–25)
CO2: 29 mmol/L (ref 20–32)
Calcium: 10.4 mg/dL (ref 8.6–10.4)
Chloride: 103 mmol/L (ref 98–110)
Creat: 0.72 mg/dL (ref 0.50–0.99)
GFR, Est African American: 102 mL/min/{1.73_m2} (ref >=60)
GFR, Est Non African American: 88 mL/min/{1.73_m2} (ref >=60)
Globulin: 2.7 g/dL (calc) (ref 1.9–3.7)
Glucose, Bld: 114 mg/dL — ABNORMAL HIGH (ref 65–99)
Potassium: 3.8 mmol/L (ref 3.5–5.3)
Sodium: 143 mmol/L (ref 135–146)
Total Bilirubin: 0.4 mg/dL (ref 0.2–1.2)
Total Protein: 7.2 g/dL (ref 6.1–8.1)

## 2019-06-01 ENCOUNTER — Other Ambulatory Visit: Payer: Self-pay

## 2019-06-01 ENCOUNTER — Ambulatory Visit
Admission: RE | Admit: 2019-06-01 | Discharge: 2019-06-01 | Disposition: A | Discharge status: Home / Self Care | Payer: PPO | Source: Ambulatory Visit | Attending: Radiation Oncology | Admitting: Radiation Oncology

## 2019-06-01 ENCOUNTER — Other Ambulatory Visit: Payer: Self-pay | Admitting: *Deleted

## 2019-06-01 DIAGNOSIS — C50511 Malignant neoplasm of lower-outer quadrant of right female breast: Secondary | ICD-10-CM | POA: Diagnosis not present

## 2019-06-01 DIAGNOSIS — Z510 Encounter for antineoplastic radiation therapy: Secondary | ICD-10-CM | POA: Diagnosis not present

## 2019-06-01 MED ORDER — TRAMADOL HCL 50 MG PO TABS: ORAL_TABLET | ORAL | 0 refills | Status: DC

## 2019-06-01 NOTE — Telephone Encounter (Signed)
Refill request received via fax  Refill request received via fax  Last Visit: 04/05/19 Next Visit: 07/10/19 UDS: 12/21/18 Narc Agreement: 05/22/18  Okay to refill Tramadol?

## 2019-06-01 NOTE — Progress Notes (Signed)
[facility]   [phone] [phone]) [phone]   Clinic Follow up Note   Patient Care Team: McLean-Scocuzza, Nino Glow, MD as PCP - General (Internal Medicine) Kyung Rudd, MD as Consulting Physician (Radiation Oncology) Truitt Merle, MD as Consulting Physician (Hematology) Donnie Mesa, MD as Consulting Physician (General Surgery) Mauro Kaufmann, RN as Oncology Nurse Navigator Rockwell Germany, RN as Oncology Nurse Navigator  Date of Service:  06/04/2019  CHIEF COMPLAINT: F/u of right breast cancer   SUMMARY OF ONCOLOGIC HISTORY: Oncology History Overview Note  Cancer Staging Malignant neoplasm of lower-outer quadrant of right breast of female, estrogen receptor positive ([place]) Staging form: Breast, AJCC 8th Edition - Clinical stage from 03/14/2019: Stage IA (cT1a, cN0, cM0, G1, ER+, PR+, HER2-) - Signed by Truitt Merle, MD on 03/20/2019 - Pathologic stage from 03/28/2019: Stage IA (pT1b, pN0(sn), cM0, G1, ER+, PR+, HER2-) - Unsigned    Malignant neoplasm of lower-outer quadrant of right breast of female, estrogen receptor positive ([place])  02/22/2019 Imaging   Bone Density Scan  02/22/19 DEXA shows osteopenia with lowest T-score -1.4 at left hip   03/09/2019 Mammogram   Diagnostic Mammogram 03/09/19 IMPRESSION the 59m mass in 7:00 posiiton of right breast suspicous of malignancy.     03/14/2019 Cancer Staging   Staging form: Breast, AJCC 8th Edition - Clinical stage from 03/14/2019: Stage IA (cT1a, cN0, cM0, G1, ER+, PR+, HER2-) - Signed by [name] MD on 03/20/2019   03/14/2019 Initial Biopsy   Diagnosis 03/14/19  Breast, right, needle core biopsy, 7 o'clock, 5cmfn - INVASIVE DUCTAL CARCINOMA. SEE NOTE Diagnosis Note Carcinoma measures 0.4 cm in greatest linear dimension and appears grade 1. Dr. CJeannie Donereviewed the case and concurs with the diagnosis. A breast prognostic profile (ER, PR, Ki-67 and HER2) is pending and will be reported in an addendum. Dr.  BIsaiah Blakeswas notified on 11/12/2019.   03/14/2019 Receptors her2   PROGNOSTIC INDICATORS Results: IMMUNOHISTOCHEMICAL AND MORPHOMETRIC ANALYSIS PERFORMED MANUALLY The tumor cells are EQUIVOCAL for Her2 (2+). Her2 by FISH will be performed and results reported separately. Estrogen Receptor: 95%, POSITIVE, STRONG STAINING INTENSITY Progesterone Receptor: 90%, POSITIVE, MODERATE STAINING INTENSITY Proliferation Marker Ki67: 10%   03/19/2019 Initial Diagnosis   Malignant neoplasm of lower-outer quadrant of right breast of female, estrogen receptor positive ([place]   03/28/2019 Surgery   RIGHT BREAST LUMPECTOMY WITH RADIOACTIVE SEED AND SENTINEL LYMPH NODE BIOPSY by Dr TGeorgette Dover   03/28/2019 Pathology Results   FINAL MICROSCOPIC DIAGNOSIS:   A. BREAST, RIGHT, LUMPECTOMY:  - Invasive ductal carcinoma, 0.9 cm, Nottingham grade 1 of 3.  - Ductal carcinoma in situ, intermediate nuclear grade.  - Margins of resection are not involved.       - Invasive carcinoma, closest margin: 3 mm, superior.       - In situ carcinoma, closest margin: 3 mm, medial.  - Biopsy site.  - See oncology table.   B. SENTINEL LYMPH NODE, RIGHT AXILLARY #1, BIOPSY:  - One lymph node, negative for carcinoma (0/1).   C. SENTINEL LYMPH NODE, RIGHT AXILLARY #2, BIOPSY:  - One lymph node, negative for carcinoma (0/1).    03/28/2019 Cancer Staging   Staging form: Breast, AJCC 8th Edition - Pathologic stage from 03/28/2019: Stage IA (pT1b, pN0(sn), cM0, G1, ER+, PR+, HER2-) - Signed by [name] MD on 06/03/2019   04/26/2019 Genetic Testing   Negative genetic testing:  No pathogenic variants detected on the [facility] Common Hereditary Cancers panel. The report date  is 04/26/2019.  The Common Hereditary Cancers Panel offered by [facility] includes sequencing and/or deletion duplication testing of the following 48 genes: APC, ATM, AXIN2, BARD1, BMPR1A, BRCA1, BRCA2, BRIP1, CDH1, CDK4, CDKN2A (p14ARF), CDKN2A (p16INK4a), CHEK2, CTNNA1,  DICER1, EPCAM (Deletion/duplication testing only), GREM1 (promoter region deletion/duplication testing only), KIT, MEN1, MLH1, MSH2, MSH3, MSH6, MUTYH, NBN, NF1, NHTL1, PALB2, PDGFRA, PMS2, POLD1, POLE, PTEN, RAD50, RAD51C, RAD51D, RNF43, SDHB, SDHC, SDHD, SMAD4, SMARCA4. STK11, TP53, TSC1, TSC2, and VHL.  The following genes were evaluated for sequence changes only: SDHA and HOXB13 c.251G>A variant only.    05/08/2019 - 06/04/2019 Radiation Therapy   Adjuvant Radiation with Dr Lisbeth Renshaw    06/2019 -  Anti-estrogen oral therapy   Tamoxifen starting in 06/2019       CURRENT THERAPY:  Radiation 05/08/19-06/04/19 Tamoxifen starting in 06/2019   INTERVAL HISTORY:  Cheryl Knight is here for a follow up. She presents to the clinic alone. She notes she has been tolerating RT and started having skin redness this week. She notes she only feels fatigued when she sits down. She denies any other issues. She had an episode of epistaxis in hot shower. She attributes this to her wormseed allergies this year. She uses oral and nasal antihistamines. She notes after pat hysterectomy and BSO at age 38 she was put into early menopause. This ws due to prolapsed uterus and PCOS. She did not have many symptoms of hot flashes. She did not take hormonal replacement.    REVIEW OF SYSTEMS:   Constitutional: Denies fevers, chills or abnormal weight loss Eyes: Denies blurriness of vision Ears, nose, mouth, throat, and face: Denies mucositis or sore throat Respiratory: Denies cough, dyspnea or wheezes Cardiovascular: Denies palpitation, chest discomfort or lower extremity swelling Gastrointestinal:  Denies nausea, heartburn or change in bowel habits Skin: Denies abnormal skin rashes Lymphatics: Denies new lymphadenopathy or easy bruising Neurological:Denies numbness, tingling or new weaknesses Behavioral/Psych: Mood is stable, no new changes  Breast: (+) Skin redness from RT  All other systems were reviewed with the  patient and are negative.  MEDICAL HISTORY:  Past Medical History:  Diagnosis Date  . Cancer ([place]) 02/2019   right breast IDC  . Cataracts, bilateral    tbd surgery 2021  . Complication of anesthesia   . Cough   . Family history of breast cancer   . Family history of lung cancer   . Family history of prostate cancer   . Family history of skin cancer   . GERD (gastroesophageal reflux disease)   . Headache   . High cholesterol   . Hypertension   . Hypothyroidism   . Irritable bowel   . Osteoarthritis   . Osteoarthritis    i.e right knee   . PONV (postoperative nausea and vomiting)   . Pre-diabetes   . Rheumatoid arthritis ([place])   . Thyroid disease     SURGICAL HISTORY: Past Surgical History:  Procedure Laterality Date  . ABDOMINAL HYSTERECTOMY     age 19 ovaries out  . BREAST LUMPECTOMY WITH RADIOACTIVE SEED AND SENTINEL LYMPH NODE BIOPSY Right 03/28/2019   Procedure: RIGHT BREAST LUMPECTOMY WITH RADIOACTIVE SEED AND SENTINEL LYMPH NODE BIOPSY;  Surgeon: Donnie Mesa, MD;  Location: [facility];  Service: General;  Laterality: Right;  PEC BLOCK FOR POST OP PAIN  . CATARACT EXTRACTION, BILATERAL    . ESOPHAGEAL DILATION     Dr. Olevia Perches 15 years from 2021  . TONSILLECTOMY    . TUBAL LIGATION  I have reviewed the social history and family history with the patient and they are unchanged from previous note.  ALLERGIES:  is allergic to lisinopril; meperidine hcl; and pentazocine lactate.  MEDICATIONS:  Current Outpatient Medications  Medication Sig Dispense Refill  . albuterol (VENTOLIN HFA) 108 (90 Base) MCG/ACT inhaler Inhale 1-2 puffs into the lungs every 6 (six) hours as needed for wheezing or shortness of breath. 18 g 11  . betamethasone dipropionate (DIPROLENE) 0.05 % cream Apply topically 2 (two) times daily. 45 g 3  . budesonide-formoterol (SYMBICORT) 160-4.5 MCG/ACT inhaler Inhale 2 puffs into the lungs 2 (two) times daily. Rinse mouth 1 Inhaler  12  . Calcium Carbonate-Vitamin D (CALCIUM PLUS VITAMIN D PO) Take by mouth.    . cetirizine (ZYRTEC) 10 MG tablet Take 10 mg by mouth daily.    . chlorpheniramine-HYDROcodone (TUSSIONEX PENNKINETIC ER) 10-8 MG/5ML SUER Take 5 mLs by mouth at bedtime as needed for cough. 140 mL 0  . cyclobenzaprine (FLEXERIL) 10 MG tablet Take 1 tablet (10 mg total) by mouth daily. 90 tablet 1  . esomeprazole (NEXIUM) 40 MG capsule 1 by mouth twice a day 200 capsule 4  . fish oil-omega-3 fatty acids 1000 MG capsule Take 1 g by mouth daily.    . fluticasone (FLONASE) 50 MCG/ACT nasal spray Place 2 sprays into both nostrils daily. 16 g 11  . glucosamine-chondroitin 500-400 MG tablet Take 1 tablet by mouth 3 (three) times daily.    Marland Kitchen HYDROcodone-acetaminophen (NORCO) 7.5-325 MG tablet Take 1 tab by mouth twice daily as needed for migraine 60 tablet 0  . leflunomide (ARAVA) 20 MG tablet Take 1 tablet (20 mg total) by mouth daily. 30 tablet 2  . levothyroxine (SYNTHROID) 100 MCG tablet Daily 30 minutes before food 90 tablet 3  . nystatin (MYCOSTATIN/NYSTOP) powder Apply 1 application topically 3 (three) times daily. Apply to skin rash under right breast 45 g 0  . olmesartan-hydrochlorothiazide (BENICAR HCT) 20-12.5 MG tablet Take 1 tablet by mouth daily. In am 90 tablet 3  . pantoprazole (PROTONIX) 40 MG tablet Take 1 tablet (40 mg total) by mouth daily. 30 min before lunch or dinner 90 tablet 3  . pilocarpine (SALAGEN) 5 MG tablet TAKE 1 TABLET BY MOUTH THREE TIMES DAILY 270 tablet 0  . pravastatin (PRAVACHOL) 40 MG tablet Take 1 tablet (40 mg total) by mouth at bedtime. 90 tablet 3  . predniSONE (DELTASONE) 20 MG tablet Take 1 tablet (20 mg total) by mouth daily with breakfast. X 7 -10 days 10 tablet 0  . promethazine (PHENERGAN) 25 MG tablet Take 1 tablet (25 mg total) by mouth every 6 (six) hours as needed. 30 tablet 2  . RESTASIS 0.05 % ophthalmic emulsion daily.     . SUMAtriptan (IMITREX) 50 MG tablet TAKE 1  TABLET BY MOUTH AS NEEDED FOR HEADACHE 9 tablet 11  . tamoxifen (NOLVADEX) 20 MG tablet Take 1 tablet (20 mg total) by mouth daily. 30 tablet 5  . topiramate (TOPAMAX) 100 MG tablet Take 1 tablet (100 mg total) by mouth daily. 90 tablet 3  . traMADol (ULTRAM) 50 MG tablet Take 2 tablets by mouth twice daily as needed for pain relief. 120 tablet 0   No current facility-administered medications for this visit.    PHYSICAL EXAMINATION: ECOG PERFORMANCE STATUS: 1 - Symptomatic but completely ambulatory  Vitals:   06/04/19 1402  BP: (!) 147/88  Pulse: 97  Resp: 18  Temp: 97.8 F (36.6 C)  SpO2:  99%   Filed Weights   06/04/19 1402  Weight: 214 lb (97.1 kg)    GENERAL:alert, no distress and comfortable SKIN: skin color, texture, turgor are normal, no rashes or significant lesions EYES: normal, Conjunctiva are pink and non-injected, sclera clear  NECK: supple, thyroid normal size, non-tender, without nodularity LYMPH:  no palpable lymphadenopathy in the cervical, axillary  LUNGS: clear to auscultation and percussion with normal breathing effort HEART: regular rate & rhythm and no murmurs and no lower extremity edema ABDOMEN:abdomen soft, non-tender and normal bowel sounds Musculoskeletal:no cyanosis of digits and no clubbing  NEURO: alert & oriented x 3 with fluent speech, no focal motor/sensory deficits BREAST: S/p left lumpectomy: Surgical incision healed well (+) Skin erythema of right breast with fungal infection under breast skin fold. No palpable mass, nodules or adenopathy bilaterally. Breast exam benign.   LABORATORY DATA:  I have reviewed the data as listed CBC Latest Ref Rng & Units 05/30/2019 04/18/2019 03/21/2019  WBC 3.8 - 10.8 Thousand/uL 5.1 6.7 6.6  Hemoglobin 11.7 - 15.5 g/dL 15.2 13.3 14.4  Hematocrit 35.0 - 45.0 % 46.9(H) 40.2 44.0  Platelets 140 - 400 Thousand/uL 333 312.0 303     CMP Latest Ref Rng & Units 05/30/2019 04/18/2019 04/05/2019  Glucose 65 - 99 mg/dL  114(H) 128(H) -  BUN 7 - 25 mg/dL 12 16 -  Creatinine 0.50 - 0.99 mg/dL 0.72 0.75 -  Sodium 135 - 146 mmol/L 143 141 -  Potassium 3.5 - 5.3 mmol/L 3.8 3.5 -  Chloride 98 - 110 mmol/L 103 104 -  CO2 20 - 32 mmol/L 29 29 -  Calcium 8.6 - 10.4 mg/dL 10.4 10.0 -  Total Protein 6.1 - 8.1 g/dL 7.2 6.8 6.9  Total Bilirubin 0.2 - 1.2 mg/dL 0.4 0.2 -  Alkaline Phos 39 - 117 U/L - 54 -  AST 10 - 35 U/L 25 14 -  ALT 6 - 29 U/L 21 9 -      RADIOGRAPHIC STUDIES: I have personally reviewed the radiological images as listed and agreed with the findings in the report. No results found.   ASSESSMENT & PLAN:  [place] FLETT is a 64 y.o. female with    1. Malignant neoplasm of lower-outer quadrant of right breast, Stage IA, p(T1bN0M0), ER/PR+, HER2-, Grade I -She was diagnosed in 02/2019 with right breast invasive ductal carcinoma. She underwent right lumpectomy with SLNB on 03/28/19 by Dr Georgette Dover. Her path showed 0.9 cm mass complete resected with clear margins, node negative. Given her small tumor and low Grade, this is likely low risk disease and adjuvant chemotherapy was not recommended.  -She proceeded with Adjuvant Radiation with Dr Lisbeth Renshaw starting 05/08/19. She plans to complete on 06/04/19. She is tolerating well with mild to moderate skin toxicity.  -Labs reviewed from last week. Physical exam shows skin fungal infection under right breast and skin erythema from RT.  -Given the strong ER and PR expression in her postmenopausal status, I discussed adjuvant endocrine therapy with aromatase inhibitor or tamoxifen for a total of 5-10 years to reduce the risk of cancer recurrence. Potential benefits and side effects were discussed with patient.. Given her RA and osteopenia, I recommend Tamoxifen which has the least effect on her joints. --The potential side effects, which includes but not limited to, hot flash, skin and vaginal dryness, slightly increased risk of cardiovascular disease and cataract,  small risk of thrombosis and endometrial cancer, were discussed with her in great details. Preventive strategies for  thrombosis, such as being physically active, using compression stocks, avoid cigarette smoking, etc., were reviewed with her.  She had struck me, no risk of endometrial cancer from tamoxifen.  She voiced good understanding, and agrees to proceed.  -The goal of therapy is curative -I recommend her to start tamoxifen in about 2 weeks when she recovers well from radiation -I encouraged her to keep protected with sunscreen -Survivorship clinic in 3 months. She and f/u with Dr Prince Solian in 6 months and f/u with me in 9 months.     2. Genetic testing -She had mother with late age breast cancer and father with prostate cancer in his 72s.  -I will check to see if she is eligible for genetic testing. She is interested if so.    3. Comorbidities: Osteoarthritis, Rheumatoid arthritis, Migraines, high cholesterol, hypothyroidism, HTN, GERD -She is on Methotrexate twice a week for her arthritis which controls RA very well. She was switched to [place]-[facility]y '20mg'$ .  -Continue medication for migraines. She may use Norco once a month.  -Continue HTN, cholesterol and thyroid medication and f/u with PCP. All well controlled -She does still have cough from lisinopril which was recently stopped. She uses inhaler as needed.  -She has been able to lose weight lately. I encouraged her to continue and watch diet and weight on Tamoxifen.  -She has h/o of Esophogeal issues and required dilation. She still has mild dysphagia with carbs.   4. Osteopenia  -Her 02/22/19 DEXA showed osteopenia with lowest T-score is -1.4 at left femur, no high risk for fracture yet.  -I discussed AI can reduce her bone density and Tamoxifen can strengthen it. Will monitor with repeat DEXA scan every 2 years.  -She will continue calcium and Vit D supplements.    PLAN: -Complete RT today -I called in Nystatin and Tamoxifen, she will  start tamoxifen in early June  -Lab and f/u with NP lacie in 3 months  -Lab and F/u with me in 9 months.    No problem-specific Assessment & Plan notes found for this encounter.   No orders of the defined types were placed in this encounter.  All questions were answered. The patient knows to call the clinic with any problems, questions or concerns. No barriers to learning was detected. The total time spent in the appointment was 30 minutes.     Truitt Merle, MD 06/04/2019   I, Joslyn Devon, am acting as scribe for Truitt Merle, MD.   I have reviewed the above documentation for accuracy and completeness, and I agree with the above.

## 2019-06-02 ENCOUNTER — Ambulatory Visit: Payer: PPO

## 2019-06-04 ENCOUNTER — Encounter: Payer: Self-pay | Admitting: Hematology

## 2019-06-04 ENCOUNTER — Encounter: Payer: Self-pay | Admitting: Radiation Oncology

## 2019-06-04 ENCOUNTER — Other Ambulatory Visit: Payer: Self-pay

## 2019-06-04 ENCOUNTER — Ambulatory Visit: Payer: PPO

## 2019-06-04 ENCOUNTER — Inpatient Hospital Stay: Payer: PPO | Attending: Hematology | Admitting: Hematology

## 2019-06-04 ENCOUNTER — Encounter: Payer: Self-pay | Admitting: *Deleted

## 2019-06-04 ENCOUNTER — Ambulatory Visit
Admission: RE | Admit: 2019-06-04 | Discharge: 2019-06-04 | Disposition: A | Discharge status: Home / Self Care | Payer: PPO | Source: Ambulatory Visit | Attending: Radiation Oncology | Admitting: Radiation Oncology

## 2019-06-04 VITALS — BP 147/88 | HR 97 | Temp 97.8°F | Resp 18 | Ht 68.0 in | Wt 214.0 lb

## 2019-06-04 DIAGNOSIS — M069 Rheumatoid arthritis, unspecified: Secondary | ICD-10-CM | POA: Insufficient documentation

## 2019-06-04 DIAGNOSIS — E7800 Pure hypercholesterolemia, unspecified: Secondary | ICD-10-CM | POA: Insufficient documentation

## 2019-06-04 DIAGNOSIS — Z90710 Acquired absence of both cervix and uterus: Secondary | ICD-10-CM | POA: Insufficient documentation

## 2019-06-04 DIAGNOSIS — C50511 Malignant neoplasm of lower-outer quadrant of right female breast: Secondary | ICD-10-CM | POA: Diagnosis not present

## 2019-06-04 DIAGNOSIS — M8580 Other specified disorders of bone density and structure, unspecified site: Secondary | ICD-10-CM | POA: Insufficient documentation

## 2019-06-04 DIAGNOSIS — Z79899 Other long term (current) drug therapy: Secondary | ICD-10-CM | POA: Insufficient documentation

## 2019-06-04 DIAGNOSIS — Z803 Family history of malignant neoplasm of breast: Secondary | ICD-10-CM | POA: Diagnosis not present

## 2019-06-04 DIAGNOSIS — Z7952 Long term (current) use of systemic steroids: Secondary | ICD-10-CM | POA: Insufficient documentation

## 2019-06-04 DIAGNOSIS — Z801 Family history of malignant neoplasm of trachea, bronchus and lung: Secondary | ICD-10-CM | POA: Insufficient documentation

## 2019-06-04 DIAGNOSIS — Z8042 Family history of malignant neoplasm of prostate: Secondary | ICD-10-CM | POA: Insufficient documentation

## 2019-06-04 DIAGNOSIS — E039 Hypothyroidism, unspecified: Secondary | ICD-10-CM | POA: Insufficient documentation

## 2019-06-04 DIAGNOSIS — I10 Essential (primary) hypertension: Secondary | ICD-10-CM | POA: Insufficient documentation

## 2019-06-04 DIAGNOSIS — Z510 Encounter for antineoplastic radiation therapy: Secondary | ICD-10-CM | POA: Diagnosis not present

## 2019-06-04 DIAGNOSIS — Z9079 Acquired absence of other genital organ(s): Secondary | ICD-10-CM | POA: Diagnosis not present

## 2019-06-04 DIAGNOSIS — Z923 Personal history of irradiation: Secondary | ICD-10-CM | POA: Insufficient documentation

## 2019-06-04 DIAGNOSIS — K219 Gastro-esophageal reflux disease without esophagitis: Secondary | ICD-10-CM | POA: Diagnosis not present

## 2019-06-04 DIAGNOSIS — Z170 Estrogen receptor positive status [ER+]: Secondary | ICD-10-CM | POA: Diagnosis not present

## 2019-06-04 DIAGNOSIS — G43909 Migraine, unspecified, not intractable, without status migrainosus: Secondary | ICD-10-CM | POA: Diagnosis not present

## 2019-06-04 DIAGNOSIS — Z90722 Acquired absence of ovaries, bilateral: Secondary | ICD-10-CM | POA: Insufficient documentation

## 2019-06-04 DIAGNOSIS — Z7951 Long term (current) use of inhaled steroids: Secondary | ICD-10-CM | POA: Insufficient documentation

## 2019-06-04 MED ORDER — TAMOXIFEN CITRATE 20 MG PO TABS
20.0000 mg | ORAL_TABLET | Freq: Every day | ORAL | 5 refills | Status: DC
Start: 2019-06-04 — End: 2019-12-24

## 2019-06-04 MED ORDER — NYSTATIN 100000 UNIT/GM EX POWD
1.0000 | Freq: Three times a day (TID) | CUTANEOUS | 0 refills | Status: DC
Start: 2019-06-04 — End: 2019-07-17

## 2019-06-05 ENCOUNTER — Telehealth: Payer: Self-pay | Admitting: Hematology

## 2019-06-05 ENCOUNTER — Encounter: Payer: Self-pay | Admitting: Pulmonary Disease

## 2019-06-05 ENCOUNTER — Ambulatory Visit: Payer: PPO | Admitting: Pulmonary Disease

## 2019-06-05 ENCOUNTER — Ambulatory Visit: Payer: PPO

## 2019-06-05 ENCOUNTER — Telehealth: Payer: Self-pay | Admitting: Internal Medicine

## 2019-06-05 VITALS — BP 128/82 | HR 103 | Temp 97.7°F | Ht 68.0 in | Wt 210.6 lb

## 2019-06-05 DIAGNOSIS — C50511 Malignant neoplasm of lower-outer quadrant of right female breast: Secondary | ICD-10-CM

## 2019-06-05 DIAGNOSIS — R05 Cough: Secondary | ICD-10-CM | POA: Diagnosis not present

## 2019-06-05 DIAGNOSIS — M0579 Rheumatoid arthritis with rheumatoid factor of multiple sites without organ or systems involvement: Secondary | ICD-10-CM | POA: Diagnosis not present

## 2019-06-05 DIAGNOSIS — R059 Cough, unspecified: Secondary | ICD-10-CM

## 2019-06-05 DIAGNOSIS — J45909 Unspecified asthma, uncomplicated: Secondary | ICD-10-CM

## 2019-06-05 DIAGNOSIS — K219 Gastro-esophageal reflux disease without esophagitis: Secondary | ICD-10-CM | POA: Diagnosis not present

## 2019-06-05 DIAGNOSIS — Z6832 Body mass index (BMI) 32.0-32.9, adult: Secondary | ICD-10-CM

## 2019-06-05 DIAGNOSIS — E6609 Other obesity due to excess calories: Secondary | ICD-10-CM

## 2019-06-05 DIAGNOSIS — Z170 Estrogen receptor positive status [ER+]: Secondary | ICD-10-CM | POA: Diagnosis not present

## 2019-06-05 NOTE — Telephone Encounter (Signed)
Pt called and said that she is having swallowing issues. She thinks its do to the albuterol (VENTOLIN HFA) 108 (90 Base) MCG/ACT inhaler

## 2019-06-05 NOTE — Progress Notes (Signed)
Subjective:    Patient ID: Cheryl Knight, female    DOB: [birth year], 64 y.o.   MRN: [id number]  HPI The patient is a 64 year old remote former smoker (approximately 7 pack years) who presents for evaluation of a cough that has been going on and off for approximately 2 years.  She is kindly referred by Dr. Olivia Mackie Mclean-Scocuzza.  The patient states that the cough started approximately in 2019 around the October or November timeframe, this followed at upper/lower respiratory illness that was not well described.  And she felt lasted approximately 4 to 6 weeks.  She then subsequently started having wheezing and worsening cough.  The cough at times is dry and at other times it is productive of clear sticky sputum.  She has no hemoptysis.  She has noted gradual progression of this over a year and activity or being outside makes it worse.  She does not note significant seasonal variation.  Currently she is on Symbicort and as needed albuterol and notes that this has made her cough markedly better.  She has also noted some dyspnea particularly on exertion.  No chest pain, paroxysmal nocturnal dyspnea or orthopnea, no lower extremity edema or calf tenderness.  She has had significant issues with gastroesophageal reflux, globus sensation and dysphagia.  She has required esophageal dilatation in the past by Dr. Delfin Edis.  She has a history of rheumatoid arthritis and has been on methotrexate in the past and currently over the last month or so has been switched to [place].  The change was necessary due to need for chemotherapy and radiation needed for breast cancer.  She has not noticed any change in her symptoms since starting [place].  Notes that she did not take her Symbicort this morning.  She will start tamoxifen on June 17.  Review of Systems A 10 point review of systems was performed and it is as noted above otherwise negative.  Immunization History  Administered Date(s) Administered  . H1N1  02/27/2008  . Influenza-Unspecified 11/18/2017  . PFIZER SARS-COV-2 Vaccination 03/02/2019, 03/29/2019  . Td 01/19/2004  . Tdap 12/17/2014   Allergies  Allergen Reactions  . Lisinopril Cough  . Meperidine Hcl     REACTION: vomiting  . Pentazocine Lactate     REACTION: hallucinations   Current Meds  Medication Sig  . albuterol (VENTOLIN HFA) 108 (90 Base) MCG/ACT inhaler Inhale 1-2 puffs into the lungs every 6 (six) hours as needed for wheezing or shortness of breath.  . betamethasone dipropionate (DIPROLENE) 0.05 % cream Apply topically 2 (two) times daily. (Patient not taking: Reported on 06/06/2019)  . budesonide-formoterol (SYMBICORT) 160-4.5 MCG/ACT inhaler Inhale 2 puffs into the lungs 2 (two) times daily. Rinse mouth  . Calcium Carbonate-Vitamin D (CALCIUM PLUS VITAMIN D PO) Take by mouth.  . chlorpheniramine-HYDROcodone (TUSSIONEX PENNKINETIC ER) 10-8 MG/5ML SUER Take 5 mLs by mouth at bedtime as needed for cough.  . cyclobenzaprine (FLEXERIL) 10 MG tablet Take 1 tablet (10 mg total) by mouth daily.  Marland Kitchen esomeprazole (NEXIUM) 40 MG capsule 1 by mouth twice a day  . fish oil-omega-3 fatty acids 1000 MG capsule Take 1 g by mouth daily.  . fluticasone (FLONASE) 50 MCG/ACT nasal spray Place 2 sprays into both nostrils daily.  Marland Kitchen glucosamine-chondroitin 500-400 MG tablet Take 1 tablet by mouth 3 (three) times daily.  Marland Kitchen HYDROcodone-acetaminophen (NORCO) 7.5-325 MG tablet Take 1 tab by mouth twice daily as needed for migraine (Patient not taking: Reported on 06/06/2019)  . leflunomide (ARAVA)  20 MG tablet Take 1 tablet (20 mg total) by mouth daily.  Marland Kitchen levothyroxine (SYNTHROID) 100 MCG tablet Daily 30 minutes before food  . nystatin (MYCOSTATIN/NYSTOP) powder Apply 1 application topically 3 (three) times daily. Apply to skin rash under right breast  . olmesartan-hydrochlorothiazide (BENICAR HCT) 20-12.5 MG tablet Take 1 tablet by mouth daily. In am  . pilocarpine (SALAGEN) 5 MG tablet TAKE 1  TABLET BY MOUTH THREE TIMES DAILY  . pravastatin (PRAVACHOL) 40 MG tablet Take 1 tablet (40 mg total) by mouth at bedtime.  . promethazine (PHENERGAN) 25 MG tablet Take 1 tablet (25 mg total) by mouth every 6 (six) hours as needed. (Patient not taking: Reported on 06/06/2019)  . RESTASIS 0.05 % ophthalmic emulsion daily.   . SUMAtriptan (IMITREX) 50 MG tablet TAKE 1 TABLET BY MOUTH AS NEEDED FOR HEADACHE (Patient not taking: Reported on 06/06/2019)  . tamoxifen (NOLVADEX) 20 MG tablet Take 1 tablet (20 mg total) by mouth daily. (Patient not taking: Reported on 06/06/2019)  . topiramate (TOPAMAX) 100 MG tablet Take 1 tablet (100 mg total) by mouth daily.  . traMADol (ULTRAM) 50 MG tablet Take 2 tablets by mouth twice daily as needed for pain relief.  . [DISCONTINUED] cetirizine (ZYRTEC) 10 MG tablet Take 10 mg by mouth daily.  . [DISCONTINUED] pantoprazole (PROTONIX) 40 MG tablet Take 1 tablet (40 mg total) by mouth daily. 30 min before lunch or dinner (Patient not taking: Reported on 06/06/2019)   Past Medical History:  Diagnosis Date  . Cancer ([place]) 02/2019   right breast IDC  . Cataracts, bilateral    tbd surgery 2021  . Complication of anesthesia   . Cough   . Family history of breast cancer   . Family history of lung cancer   . Family history of prostate cancer   . Family history of skin cancer   . GERD (gastroesophageal reflux disease)   . Headache   . High cholesterol   . Hypertension   . Hypothyroidism   . Irritable bowel   . Osteoarthritis    i.e right knee   . PONV (postoperative nausea and vomiting)   . Pre-diabetes   . Rheumatoid arthritis ([facility])   . Thyroid disease    Past Surgical History:  Procedure Laterality Date  . ABDOMINAL HYSTERECTOMY     age 40 ovaries out  . BREAST LUMPECTOMY WITH RADIOACTIVE SEED AND SENTINEL LYMPH NODE BIOPSY Right 03/28/2019   Procedure: RIGHT BREAST LUMPECTOMY WITH RADIOACTIVE SEED AND SENTINEL LYMPH NODE BIOPSY;  Surgeon: Donnie Mesa,  MD;  Location: [facility];  Service: General;  Laterality: Right;  PEC BLOCK FOR POST OP PAIN  . CATARACT EXTRACTION, BILATERAL    . ESOPHAGEAL DILATION     Dr. Olevia Perches 15 years from 2021  . TONSILLECTOMY    . TUBAL LIGATION     Family History  Problem Relation Age of Onset  . Hypertension Mother   . Thyroid disease Mother   . Breast cancer Mother 65       breast dx age 87 died age 64   . Prostate cancer Father 46       metastatic  . Stroke Maternal Grandmother   . Skin cancer Maternal Uncle        dx. in his 21s  . Lung cancer Maternal Uncle        smoker, died age 6   Social History   Tobacco Use  . Smoking status: Former Smoker  Packs/day: 0.25    Years: 7.00    Pack years: 1.75    Types: Cigarettes    Quit date: 2000    Years since quitting: 21.3  . Smokeless tobacco: Never Used  Substance Use Topics  . Alcohol use: Yes    Comment: socially    As noted above the patient smoked in the past but described herself as a "social smoker".  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  She has 2 dogs in the home she has no unusual hobbies does hunt and fish.  No military time.  No travel outside of the country and no residence outside of [place].  Previously tested for tuberculosis all negative.   Objective:   Physical Exam BP 128/82 (BP Location: Left Arm, Patient Position: Sitting, Cuff Size: Large)   Pulse (!) 103   Temp 97.7 F (36.5 C) (Temporal)   Ht 5\' 8"  (1.727 m)   Wt 210 lb 9.6 oz (95.5 kg)   SpO2 96%   BMI 32.02 kg/m   GENERAL: Obese woman, no acute distress.  Fully ambulatory. HEAD: Normocephalic, atraumatic.  EYES: Pupils equal, round, reactive to light.  No scleral icterus.  MOUTH: Nose/mouth/throat not examined due to masking requirements for COVID 19. NECK: Supple. No thyromegaly. Trachea midline. No JVD.  No adenopathy. PULMONARY: Good air  entry bilaterally, some end expiratory wheezes noted particularly in the upper lung zones.  No other adventitious sounds. CARDIOVASCULAR: S1 and S2. Regular rate and rhythm.  Grade 1/6 systolic ejection murmur left sternal border. GASTROINTESTINAL: Obese, benign. MUSCULOSKELETAL: No joint deformity, no clubbing, no edema.  NEUROLOGIC: Awake, alert, oriented, fully ambulatory no gait disturbance noted.  Speech is fluent, no overt focal deficits. SKIN: Intact,warm,dry.  Limited exam shows no rashes. PSYCH: Mood and behavior appropriate.      Assessment & Plan:     ICD-10-CM   1. Persistent asthma without complication, unspecified asthma severity  J45.909 Pulmonary Function Test [facility] Only   Suspect she has persistent asthma, will obtain PFTs to assess severity Instructed her to use Symbicort twice a day, spacer provided  2. Cough  R05    Likely related to asthma but possibly aggravated by GERD  3. Gastroesophageal reflux disease, unspecified whether esophagitis present  K21.9    Quite symptomatic in this regard may need to revisit with GI On Nexium  4. Malignant neoplasm of lower-outer quadrant of right breast of female, estrogen receptor positive ([facility])  C50.511    Z17.0    This issue adds complexity to her management  5. Rheumatoid arthritis involving multiple sites with positive rheumatoid factor (HCC)  M05.79    This issue adds complexity to her management  6. Class 1 obesity due to excess calories without serious comorbidity with body mass index (BMI) of 32.0 to 32.9 in adult  E66.09    Z68.32    This issue adds complexity to her management.   Orders Placed This Encounter  Procedures  . Pulmonary Function Test [facility] Only    Standing Status:   Future    Standing Expiration Date:   06/04/2020    Order Specific Question:   Full PFT: includes the following: basic spirometry, spirometry pre & post bronchodilator, diffusion capacity (DLCO), lung volumes    Answer:   Full PFT    Order  Specific Question:   This test can only be performed at  Answer:   [facility]    Discussion:  Suspect the patient's symptoms are related to persistent asthma poorly treated.  She has several confounding variables and that she has rheumatoid arthritis and this will predispose her to other inflammatory diseases.  Previously had been on methotrexate which sometimes can cause lung toxicity.  Has now been discontinued and she is on [place].  In similar fashion her symptoms will need to be monitored on this medication as sometimes these medications can aggravate respiratory conditions.  In addition she has been diagnosed with breast cancer and is to start tamoxifen and this can also aggravate some of her symptoms.  She will need to be monitored closely.  We are obtaining pulmonary function testing to establish the severity of her asthma.  Also to determine if she has chronic obstructive lung disease features associated with this.  She was instructed to take her Symbicort twice a day and we provided her with a spacer to better deliver the medication.  She was taught on the proper use of the spacer.  Thank you for allowing me to participate in this patient's care.  Renold Don, MD [facility]   *This note was dictated using voice recognition software/Dragon.  Despite best efforts to proofread, errors can occur which can change the meaning.  Any change was purely unintentional.

## 2019-06-05 NOTE — Telephone Encounter (Signed)
Patient has appointment with PCP tomorrow @ 0800.

## 2019-06-05 NOTE — Patient Instructions (Signed)
Continue Symbicort twice a day.  We have provided you with a spacer to better deliver the medication.   We will schedule you for breathing tests.   We will see you in follow-up in 2 months time call sooner should any new difficulties arise.

## 2019-06-05 NOTE — Telephone Encounter (Signed)
Scheduled appt per 5/17 los.  Left a vm of the appt dates and time.

## 2019-06-06 ENCOUNTER — Encounter: Payer: Self-pay | Admitting: Internal Medicine

## 2019-06-06 ENCOUNTER — Encounter: Payer: Self-pay | Admitting: Pulmonary Disease

## 2019-06-06 ENCOUNTER — Other Ambulatory Visit: Payer: Self-pay

## 2019-06-06 ENCOUNTER — Telehealth (INDEPENDENT_AMBULATORY_CARE_PROVIDER_SITE_OTHER): Payer: PPO | Admitting: Internal Medicine

## 2019-06-06 VITALS — BP 144/88 | Ht 68.0 in | Wt 210.6 lb

## 2019-06-06 DIAGNOSIS — J029 Acute pharyngitis, unspecified: Secondary | ICD-10-CM | POA: Diagnosis not present

## 2019-06-06 DIAGNOSIS — B370 Candidal stomatitis: Secondary | ICD-10-CM

## 2019-06-06 DIAGNOSIS — B3781 Candidal esophagitis: Secondary | ICD-10-CM | POA: Diagnosis not present

## 2019-06-06 MED ORDER — NYSTATIN 100000 UNIT/ML MT SUSP: 5.0000 mL | Freq: Four times a day (QID) | OROMUCOSAL | 1 refills | Status: DC

## 2019-06-06 NOTE — Progress Notes (Signed)
Virtual Visit via Video Note  I connected with Cheryl Knight  on 06/06/19 at  8:15 AM EDT by a video enabled telemedicine application and verified that I am speaking with the correct person using two identifiers.  Location patient: home Location provider:work or home office Persons participating in the virtual visit: patient, provider, pts husband  I discussed the limitations of evaluation and management by telemedicine and the availability of in person appointments. The patient expressed understanding and agreed to proceed.   HPI: Sore throat left side since 06/05/19 new and nose bleeding left side w/in the last week sore throat mild to moderate hurts to swallow nothing tried, no fever, no covid exposure she is getting radiation but not chemo for breast cancer now and has not started Tamoxifen. She is s/p tonsillectomy for tonsillitis in the past.  Left side of throat is red no white exudate   ROS: See pertinent positives and negatives per HPI.  Past Medical History:  Diagnosis Date  . Cancer ([facility]) 02/2019   right breast IDC  . Cataracts, bilateral    tbd surgery 2021  . Complication of anesthesia   . Cough   . Family history of breast cancer   . Family history of lung cancer   . Family history of prostate cancer   . Family history of skin cancer   . GERD (gastroesophageal reflux disease)   . Headache   . High cholesterol   . Hypertension   . Hypothyroidism   . Irritable bowel   . Osteoarthritis   . Osteoarthritis    i.e right knee   . PONV (postoperative nausea and vomiting)   . Pre-diabetes   . Rheumatoid arthritis ([facility])   . Thyroid disease     Past Surgical History:  Procedure Laterality Date  . ABDOMINAL HYSTERECTOMY     age 24 ovaries out  . BREAST LUMPECTOMY WITH RADIOACTIVE SEED AND SENTINEL LYMPH NODE BIOPSY Right 03/28/2019   Procedure: RIGHT BREAST LUMPECTOMY WITH RADIOACTIVE SEED AND SENTINEL LYMPH NODE BIOPSY;  Surgeon: Donnie Mesa, MD;  Location:  [facility];  Service: General;  Laterality: Right;  PEC BLOCK FOR POST OP PAIN  . CATARACT EXTRACTION, BILATERAL    . ESOPHAGEAL DILATION     Dr. Olevia Perches 15 years from 2021  . TONSILLECTOMY    . TUBAL LIGATION      Family History  Problem Relation Age of Onset  . Hypertension Mother   . Thyroid disease Mother   . Breast cancer Mother 42       breast dx age 44 died age 12   . Prostate cancer Father 8       metastatic  . Stroke Maternal Grandmother   . Skin cancer Maternal Uncle        dx. in his 66s  . Lung cancer Maternal Uncle        smoker, died age 79    SOCIAL [id number] at home with husband   Current Outpatient Medications:  .  albuterol (VENTOLIN HFA) 108 (90 Base) MCG/ACT inhaler, Inhale 1-2 puffs into the lungs every 6 (six) hours as needed for wheezing or shortness of breath., Disp: 18 g, Rfl: 11 .  budesonide-formoterol (SYMBICORT) 160-4.5 MCG/ACT inhaler, Inhale 2 puffs into the lungs 2 (two) times daily. Rinse mouth, Disp: 1 Inhaler, Rfl: 12 .  Calcium Carbonate-Vitamin D (CALCIUM PLUS VITAMIN D PO), Take by mouth., Disp: , Rfl:  .  chlorpheniramine-HYDROcodone (TUSSIONEX PENNKINETIC ER) 10-8 MG/5ML SUER, Take 5 mLs  by mouth at bedtime as needed for cough., Disp: 140 mL, Rfl: 0 .  cyclobenzaprine (FLEXERIL) 10 MG tablet, Take 1 tablet (10 mg total) by mouth daily., Disp: 90 tablet, Rfl: 1 .  esomeprazole (NEXIUM) 40 MG capsule, 1 by mouth twice a day, Disp: 200 capsule, Rfl: 4 .  fish oil-omega-3 fatty acids 1000 MG capsule, Take 1 g by mouth daily., Disp: , Rfl:  .  fluticasone (FLONASE) 50 MCG/ACT nasal spray, Place 2 sprays into both nostrils daily., Disp: 16 g, Rfl: 11 .  glucosamine-chondroitin 500-400 MG tablet, Take 1 tablet by mouth 3 (three) times daily., Disp: , Rfl:  .  hydrocortisone cream 1 %, Apply 1 application topically., Disp: , Rfl:  .  leflunomide (ARAVA) 20 MG tablet, Take 1 tablet (20 mg total) by mouth daily., Disp: 30 tablet, Rfl:  2 .  levothyroxine (SYNTHROID) 100 MCG tablet, Daily 30 minutes before food, Disp: 90 tablet, Rfl: 3 .  loratadine (CLARITIN) 10 MG tablet, Take 10 mg by mouth daily., Disp: , Rfl:  .  nystatin (MYCOSTATIN/NYSTOP) powder, Apply 1 application topically 3 (three) times daily. Apply to skin rash under right breast, Disp: 45 g, Rfl: 0 .  olmesartan-hydrochlorothiazide (BENICAR HCT) 20-12.5 MG tablet, Take 1 tablet by mouth daily. In am, Disp: 90 tablet, Rfl: 3 .  omeprazole (PRILOSEC) 10 MG capsule, Take 20 mg by mouth in the morning and at bedtime., Disp: , Rfl:  .  pilocarpine (SALAGEN) 5 MG tablet, TAKE 1 TABLET BY MOUTH THREE TIMES DAILY, Disp: 270 tablet, Rfl: 0 .  pravastatin (PRAVACHOL) 40 MG tablet, Take 1 tablet (40 mg total) by mouth at bedtime., Disp: 90 tablet, Rfl: 3 .  RESTASIS 0.05 % ophthalmic emulsion, daily. , Disp: , Rfl:  .  topiramate (TOPAMAX) 100 MG tablet, Take 1 tablet (100 mg total) by mouth daily., Disp: 90 tablet, Rfl: 3 .  traMADol (ULTRAM) 50 MG tablet, Take 2 tablets by mouth twice daily as needed for pain relief., Disp: 120 tablet, Rfl: 0 .  betamethasone dipropionate (DIPROLENE) 0.05 % cream, Apply topically 2 (two) times daily. (Patient not taking: Reported on 06/06/2019), Disp: 45 g, Rfl: 3 .  HYDROcodone-acetaminophen (NORCO) 7.5-325 MG tablet, Take 1 tab by mouth twice daily as needed for migraine (Patient not taking: Reported on 06/06/2019), Disp: 60 tablet, Rfl: 0 .  nystatin (MYCOSTATIN) 100000 UNIT/ML suspension, Use as directed 5 mLs (500,000 Units total) in the mouth or throat 4 (four) times daily. Rinse hold and swallow x 7-14 days, Disp: 473 mL, Rfl: 1 .  promethazine (PHENERGAN) 25 MG tablet, Take 1 tablet (25 mg total) by mouth every 6 (six) hours as needed. (Patient not taking: Reported on 06/06/2019), Disp: 30 tablet, Rfl: 2 .  SUMAtriptan (IMITREX) 50 MG tablet, TAKE 1 TABLET BY MOUTH AS NEEDED FOR HEADACHE (Patient not taking: Reported on 06/06/2019), Disp: 9  tablet, Rfl: 11 .  tamoxifen (NOLVADEX) 20 MG tablet, Take 1 tablet (20 mg total) by mouth daily. (Patient not taking: Reported on 06/06/2019), Disp: 30 tablet, Rfl: 5  EXAM:  VITALS per patient if applicable:  GENERAL: alert, oriented, appears well and in no acute distress  HEENT: atraumatic, conjunttiva clear, no obvious abnormalities on inspection of external nose and ears  NECK: normal movements of the head and neck  LUNGS: on inspection no signs of respiratory distress, breathing rate appears normal, no obvious gross SOB, gasping or wheezing  CV: no obvious cyanosis  MS: moves all visible extremities without  noticeable abnormality  PSYCH/NEURO: pleasant and cooperative, no obvious depression or anxiety, speech and thought processing grossly intact  ASSESSMENT AND PLAN:  Discussed the following assessment and plan:  Sore throat could be viral vs thrush due to meds oral immunosuppressants vs inhalers  Thrush of mouth and esophagus (HCC) - Plan: nystatin (MYCOSTATIN) 100000 UNIT/ML suspension NS, Chloroseptic spray/cepacol, prn Tylenol, warm salt water gargles + hydrogen peroxide, Nystatin swish and swallow, hydration  otc allergy pill  If not better reassess by 5/21 for ENT referral and consideration of Abx   -we discussed possible serious and likely etiologies, options for evaluation and workup, limitations of telemedicine visit vs in person visit, treatment, treatment risks and precautions. Pt prefers to treat via telemedicine empirically rather then risking or undertaking an in person visit at this moment. Patient agrees to seek prompt in person care if worsening, new symptoms arise, or if is not improving with treatment.   I discussed the assessment and treatment plan with the patient. The patient was provided an opportunity to ask questions and all were answered. The patient agreed with the plan and demonstrated an understanding of the instructions.   The patient was  advised to call back or seek an in-person evaluation if the symptoms worsen or if the condition fails to improve as anticipated.  Time spent 20 min Delorise Jackson, MD

## 2019-06-08 ENCOUNTER — Telehealth: Payer: Self-pay | Admitting: Adult Health

## 2019-06-08 NOTE — Telephone Encounter (Signed)
Called and [facility] about AET study.  Asked patient to return my call.

## 2019-06-11 ENCOUNTER — Telehealth: Payer: Self-pay | Admitting: Adult Health

## 2019-06-11 NOTE — Telephone Encounter (Signed)
Called and [facility] with patient about AET monitoring study.  Asked that she call me back.  Wilber Bihari, NP

## 2019-06-14 DIAGNOSIS — R05 Cough: Secondary | ICD-10-CM | POA: Diagnosis not present

## 2019-06-14 DIAGNOSIS — Z01812 Encounter for preprocedural laboratory examination: Secondary | ICD-10-CM | POA: Diagnosis not present

## 2019-06-14 DIAGNOSIS — Z853 Personal history of malignant neoplasm of breast: Secondary | ICD-10-CM | POA: Diagnosis not present

## 2019-06-14 DIAGNOSIS — K219 Gastro-esophageal reflux disease without esophagitis: Secondary | ICD-10-CM | POA: Diagnosis not present

## 2019-06-14 DIAGNOSIS — J4530 Mild persistent asthma, uncomplicated: Secondary | ICD-10-CM | POA: Diagnosis not present

## 2019-06-14 DIAGNOSIS — Z1211 Encounter for screening for malignant neoplasm of colon: Secondary | ICD-10-CM | POA: Diagnosis not present

## 2019-06-14 DIAGNOSIS — R1310 Dysphagia, unspecified: Secondary | ICD-10-CM | POA: Diagnosis not present

## 2019-06-14 NOTE — Telephone Encounter (Signed)
Cheryl Knight, pt asking about getting her PFT scheduled here at [facility]. Order placed on 06/05/19. Please advise, thanks!

## 2019-06-26 ENCOUNTER — Other Ambulatory Visit: Payer: Self-pay | Admitting: *Deleted

## 2019-06-26 DIAGNOSIS — M0579 Rheumatoid arthritis with rheumatoid factor of multiple sites without organ or systems involvement: Secondary | ICD-10-CM

## 2019-06-26 MED ORDER — LEFLUNOMIDE 20 MG PO TABS: 20.0000 mg | ORAL_TABLET | Freq: Every day | ORAL | 2 refills | Status: DC

## 2019-06-26 NOTE — Progress Notes (Deleted)
Office Visit Note  Patient: Cheryl Knight             Date of Birth: 02/02/1955           MRN: [id number]             PCP: McLean-Scocuzza, Nino Glow, MD Referring: Orland Mustard * Visit Date: 07/10/2019 Occupation: @GUAROCC @  Subjective:  No chief complaint on file.   History of Present Illness: Cheryl Knight is a 64 y.o. female ***   Activities of Daily Living:  Patient reports morning stiffness for *** {minute/hour:19697}.   Patient {ACTIONS;DENIES/REPORTS:21021675::"Denies"} nocturnal pain.  Difficulty dressing/grooming: {ACTIONS;DENIES/REPORTS:21021675::"Denies"} Difficulty climbing stairs: {ACTIONS;DENIES/REPORTS:21021675::"Denies"} Difficulty getting out of chair: {ACTIONS;DENIES/REPORTS:21021675::"Denies"} Difficulty using hands for taps, buttons, cutlery, and/or writing: {ACTIONS;DENIES/REPORTS:21021675::"Denies"}  No Rheumatology ROS completed.   PMFS History:  Patient Active Problem List   Diagnosis Date Noted  . Genetic testing 05/02/2019  . Family history of breast cancer   . Family history of prostate cancer   . Family history of skin cancer   . Family history of lung cancer   . Malignant neoplasm of lower-outer quadrant of right breast of female, estrogen receptor positive ([place]) 03/19/2019  . Type 2 diabetes mellitus without complication, without long-term current use of insulin ([place]) 02/08/2019  . Allergic rhinitis 02/08/2019  . Osteoarthritis   . Primary osteoarthritis involving multiple joints 11/15/2017  . Moderate asthma with acute exacerbation 05/03/2017  . Routine general medical examination at a health care facility 02/08/2017  . Primary osteoarthritis of both feet 02/10/2016  . Hx of migraine headaches 02/07/2016  . History of hypothyroidism 02/07/2016  . History of gastroesophageal reflux (GERD) 02/07/2016  . Obese 12/30/2015  . Basal cell carcinoma of chest wall 10/05/2011  . Hypertension 09/01/2010  . Hyperlipidemia  02/27/2008  . Hypothyroidism 02/21/2007  . Migraine 02/21/2007  . VAGINITIS, ATROPHIC 02/21/2007  . Psoriasis 02/21/2007  . Sicca syndrome, unspecified ([place]) 02/21/2007  . GERD 06/29/2006  . Rheumatoid arthritis ([place]) 06/29/2006    Past Medical History:  Diagnosis Date  . Cancer ([place]) 02/2019   right breast IDC  . Cataracts, bilateral    tbd surgery 2021  . Complication of anesthesia   . Cough   . Family history of breast cancer   . Family history of lung cancer   . Family history of prostate cancer   . Family history of skin cancer   . GERD (gastroesophageal reflux disease)   . Headache   . High cholesterol   . Hypertension   . Hypothyroidism   . Irritable bowel   . Osteoarthritis    i.e right knee   . PONV (postoperative nausea and vomiting)   . Pre-diabetes   . Rheumatoid arthritis ([place])   . Thyroid disease     Family History  Problem Relation Age of Onset  . Hypertension Mother   . Thyroid disease Mother   . Breast cancer Mother 53       breast dx age 22 died age 53   . Prostate cancer Father 54       metastatic  . Stroke Maternal Grandmother   . Skin cancer Maternal Uncle        dx. in his 55s  . Lung cancer Maternal Uncle        smoker, died age 27   Past Surgical History:  Procedure Laterality Date  . ABDOMINAL HYSTERECTOMY     age 27 ovaries out  . BREAST LUMPECTOMY WITH RADIOACTIVE SEED AND SENTINEL LYMPH  NODE BIOPSY Right 03/28/2019   Procedure: RIGHT BREAST LUMPECTOMY WITH RADIOACTIVE SEED AND SENTINEL LYMPH NODE BIOPSY;  Surgeon: Donnie Mesa, MD;  Location: [facility];  Service: General;  Laterality: Right;  PEC BLOCK FOR POST OP PAIN  . CATARACT EXTRACTION, BILATERAL    . ESOPHAGEAL DILATION     Dr. Olevia Perches 15 years from 2021  . TONSILLECTOMY    . 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 H1N1  02/27/2008  . Influenza-Unspecified 11/18/2017  . PFIZER SARS-COV-2 Vaccination 03/02/2019, 03/29/2019  . Td 01/19/2004  . Tdap 12/17/2014     Objective: Vital Signs: There were no vitals taken for this visit.   Physical Exam   Musculoskeletal Exam: ***  CDAI Exam: CDAI Score: -- Patient Global: --; Provider Global: -- Swollen: --; Tender: -- Joint Exam 07/10/2019   No joint exam has been documented for this visit   There is currently no information documented on the homunculus. Go to the Rheumatology activity and complete the homunculus joint exam.  Investigation: No additional findings.  Imaging: No results found.  Recent Labs: Lab Results  Component Value Date   WBC 5.1 05/30/2019   HGB 15.2 05/30/2019   PLT 333 05/30/2019   NA 143 05/30/2019   K 3.8 05/30/2019   CL 103 05/30/2019   CO2 29 05/30/2019   GLUCOSE 114 (H) 05/30/2019   BUN 12 05/30/2019   CREATININE 0.72 05/30/2019   BILITOT 0.4 05/30/2019   ALKPHOS 54 04/18/2019   AST 25 05/30/2019   ALT 21 05/30/2019   PROT 7.2 05/30/2019   ALBUMIN 4.1 04/18/2019   CALCIUM 10.4 05/30/2019   GFRAA 102 05/30/2019   QFTBGOLDPLUS NEGATIVE 04/05/2019    Speciality Comments: No specialty comments available.  Procedures:  No procedures performed Allergies: Lisinopril, Meperidine hcl, and Pentazocine lactate   Assessment / Plan:     Visit Diagnoses: No diagnosis found.  Orders: No orders of the defined types were placed in this encounter.  No orders of the defined types were placed in this encounter.   Face-to-face time spent with patient was *** minutes. Greater than 50% of time was spent in counseling and coordination of care.  Follow-Up Instructions: No follow-ups on file.   Earnestine Mealing, CMA  Note - This record has been created using Editor, commissioning.  Chart creation errors have been sought, but may not always  have been located. Such creation errors do not reflect on  the standard of  medical care.

## 2019-06-26 NOTE — Telephone Encounter (Signed)
Refill request received via fax   Last Visit:04/05/19 Next Visit:07/10/19 Labs: 05/30/2019 Glucose is 114. Rest of CMP WNL. Hct is mildly elevated. Rest of CBC WNL.  Current Dose per office note on 04/05/2019: Arava 20 mg  Okay to refill per Dr. Estanislado Pandy

## 2019-07-02 ENCOUNTER — Other Ambulatory Visit
Admission: RE | Admit: 2019-07-02 | Discharge: 2019-07-02 | Disposition: A | Discharge status: Home / Self Care | Payer: PPO | Source: Ambulatory Visit | Attending: Pulmonary Disease | Admitting: Pulmonary Disease

## 2019-07-02 ENCOUNTER — Other Ambulatory Visit: Payer: Self-pay

## 2019-07-02 DIAGNOSIS — Z20822 Contact with and (suspected) exposure to covid-19: Secondary | ICD-10-CM | POA: Diagnosis not present

## 2019-07-02 DIAGNOSIS — Z01812 Encounter for preprocedural laboratory examination: Secondary | ICD-10-CM | POA: Insufficient documentation

## 2019-07-03 ENCOUNTER — Ambulatory Visit: Payer: PPO | Attending: Pulmonary Disease

## 2019-07-03 DIAGNOSIS — J45909 Unspecified asthma, uncomplicated: Secondary | ICD-10-CM | POA: Diagnosis not present

## 2019-07-03 LAB — SARS CORONAVIRUS 2 (TAT 6-24 HRS): SARS Coronavirus 2: NEGATIVE

## 2019-07-03 MED ORDER — ALBUTEROL SULFATE (2.5 MG/3ML) 0.083% IN NEBU
2.5000 mg | INHALATION_SOLUTION | Freq: Once | RESPIRATORY_TRACT | Status: AC
  Administered 2019-07-03: 2.5 mg via RESPIRATORY_TRACT
  Filled 2019-07-03: qty 3

## 2019-07-04 NOTE — Progress Notes (Signed)
Office Visit Note  Patient: Cheryl Knight             Date of Birth: [birth year]           MRN: [id number]             PCP: McLean-Scocuzza, Nino Glow, MD Referring: Orland Mustard * Visit Date: 07/17/2019 Occupation: @GUAROCC @  Subjective:  Joint stiffness.   History of Present Illness: Cheryl Knight is a 64 y.o. female with history of rheumatoid arthritis and osteoarthritis.  She states she has been having stiffness in her joints but not joint swelling.  She states she is much better on [place] compared to methotrexate.  She has been tolerating the medication well.  She denies any joint swelling.  She states the knee joint discomfort has improved a lot.  She continues to have some cough for which she has been seeing a pulmonologist.  She is a scheduled to get EGD.  Activities of Daily Living:  Patient reports morning stiffness for 0  minutes.   Patient Denies nocturnal pain.  Difficulty dressing/grooming: Denies Difficulty climbing stairs: Denies Difficulty getting out of chair: Denies Difficulty using hands for taps, buttons, cutlery, and/or writing: Reports  Review of Systems  Constitutional: Positive for fatigue. Negative for night sweats, weight gain and weight loss.  HENT: Positive for nosebleeds and mouth dryness. Negative for mouth sores, trouble swallowing, trouble swallowing and nose dryness.   Eyes: Negative for pain, redness, itching, visual disturbance and dryness.  Respiratory: Positive for cough and wheezing. Negative for shortness of breath and difficulty breathing.   Cardiovascular: Negative for chest pain, palpitations, hypertension, irregular heartbeat and swelling in legs/feet.  Gastrointestinal: Negative for blood in stool, constipation and diarrhea.  Endocrine: Negative for increased urination.  Genitourinary: Negative for difficulty urinating, painful urination and vaginal dryness.  Musculoskeletal: Positive for arthralgias, joint pain,  myalgias, muscle tenderness and myalgias. Negative for joint swelling, muscle weakness and morning stiffness.  Skin: Negative for color change, rash, hair loss, redness, skin tightness, ulcers and sensitivity to sunlight.  Allergic/Immunologic: Negative for susceptible to infections.  Neurological: Negative for dizziness, numbness, headaches, memory loss, night sweats and weakness.  Hematological: Negative for bruising/bleeding tendency and swollen glands.  Psychiatric/Behavioral: Negative for depressed mood, confusion and sleep disturbance. The patient is not nervous/anxious.     PMFS History:  Patient Active Problem List   Diagnosis Date Noted  . Genetic testing 05/02/2019  . Family history of breast cancer   . Family history of prostate cancer   . Family history of skin cancer   . Family history of lung cancer   . Malignant neoplasm of lower-outer quadrant of right breast of female, estrogen receptor positive ([place]) 03/19/2019  . Type 2 diabetes mellitus without complication, without long-term current use of insulin ([place]) 02/08/2019  . Allergic rhinitis 02/08/2019  . Osteoarthritis   . Primary osteoarthritis involving multiple joints 11/15/2017  . Moderate asthma with acute exacerbation 05/03/2017  . Routine general medical examination at a health care facility 02/08/2017  . Primary osteoarthritis of both feet 02/10/2016  . Hx of migraine headaches 02/07/2016  . History of hypothyroidism 02/07/2016  . History of gastroesophageal reflux (GERD) 02/07/2016  . Obese 12/30/2015  . Basal cell carcinoma of chest wall 10/05/2011  . Hypertension 09/01/2010  . Hyperlipidemia 02/27/2008  . Hypothyroidism 02/21/2007  . Migraine 02/21/2007  . VAGINITIS, ATROPHIC 02/21/2007  . Psoriasis 02/21/2007  . Sicca syndrome, unspecified ([place]) 02/21/2007  . GERD 06/29/2006  .  Rheumatoid arthritis ([place]) 06/29/2006    Past Medical History:  Diagnosis Date  . Cancer ([place]) 02/2019   right breast IDC    . Cataracts, bilateral    tbd surgery 2021  . Complication of anesthesia   . Cough   . Family history of breast cancer   . Family history of lung cancer   . Family history of prostate cancer   . Family history of skin cancer   . GERD (gastroesophageal reflux disease)   . Headache   . High cholesterol   . Hypertension   . Hypothyroidism   . Irritable bowel   . Osteoarthritis    i.e right knee   . PONV (postoperative nausea and vomiting)   . Pre-diabetes   . Rheumatoid arthritis ([place])   . Thyroid disease     Family History  Problem Relation Age of Onset  . Hypertension Mother   . Thyroid disease Mother   . Breast cancer Mother 96       breast dx age 64 died age 22   . Prostate cancer Father 47       metastatic  . Stroke Maternal Grandmother   . Skin cancer Maternal Uncle        dx. in his 8s  . Lung cancer Maternal Uncle        smoker, died age 30   Past Surgical History:  Procedure Laterality Date  . ABDOMINAL HYSTERECTOMY     age 19 ovaries out  . BREAST LUMPECTOMY WITH RADIOACTIVE SEED AND SENTINEL LYMPH NODE BIOPSY Right 03/28/2019   Procedure: RIGHT BREAST LUMPECTOMY WITH RADIOACTIVE SEED AND SENTINEL LYMPH NODE BIOPSY;  Surgeon: Donnie Mesa, MD;  Location: [facility];  Service: General;  Laterality: Right;  PEC BLOCK FOR POST OP PAIN  . CATARACT EXTRACTION, BILATERAL    . ESOPHAGEAL DILATION     Dr. Olevia Perches 15 years from 2021  . TONSILLECTOMY    . 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 H1N1 02/27/2008  . Influenza-Unspecified 11/18/2017  . PFIZER SARS-COV-2 Vaccination 03/02/2019, 03/29/2019  . Td 01/19/2004  . Tdap 12/17/2014     Objective: Vital Signs: BP 131/72 (BP Location: Left Arm, Patient Position: Sitting, Cuff Size: Normal)   Pulse (!) 106   Resp 15   Ht 5\' 8"  (1.727 m)   Wt 210 lb (95.3 kg)    BMI 31.93 kg/m    Physical Exam Vitals and nursing note reviewed.  Constitutional:      Appearance: She is well-developed.  HENT:     Head: Normocephalic and atraumatic.  Eyes:     Conjunctiva/sclera: Conjunctivae normal.  Cardiovascular:     Rate and Rhythm: Normal rate and regular rhythm.     Heart sounds: Normal heart sounds.  Pulmonary:     Effort: Pulmonary effort is normal.     Breath sounds: Normal breath sounds.  Abdominal:     General: Bowel sounds are normal.     Palpations: Abdomen is soft.  Musculoskeletal:     Cervical back: Normal range of motion.  Lymphadenopathy:     Cervical: No cervical adenopathy.  Skin:    General: Skin is warm and dry.     Capillary Refill: Capillary refill takes less than 2 seconds.  Neurological:     Mental Status: She is alert and  oriented to person, place, and time.  Psychiatric:        Behavior: Behavior normal.      Musculoskeletal Exam: C-spine thoracic and lumbar spine with good range of motion.  Shoulder joints, elbow joints, wrist joints with good range of motion.  She had no synovitis over MCPs.  She has DIP and PIP thickening in her hands and feet.  Hip joints and knee joints in good range of motion with no swelling.  CDAI Exam: CDAI Score: 0.6  Patient Global: 3 mm; Provider Global: 3 mm Swollen: 0 ; Tender: 0  Joint Exam 07/17/2019   No joint exam has been documented for this visit   There is currently no information documented on the homunculus. Go to the Rheumatology activity and complete the homunculus joint exam.  Investigation: No additional findings.  Imaging: Pulmonary Function Test [facility] Only  Result Date: 07/04/2019 Spirometry Data Is Acceptable and Reproducible No obvious evidence of Obstructive Airways disease or Restrictive Lung disease Consider outpatient follow up with Pulmonary if needed. Clinical Correlation Advised   XR Foot 2 Views Left  Result Date: 07/17/2019 First MTP, PIP and DIP narrowing  was noted.  No intertarsal or tibiotalar joint space narrowing was noted.  Juxta-articular osteopenia was noted.  Dorsal spurring was noted.  Inferior and posterior calcaneal spurs were noted.  No erosive changes were noted.  No radiographic progression was noted when compared to the x-rays of 2018. Impression: These findings are consistent with rheumatoid arthritis and osteoarthritis overlap.  XR Foot 2 Views Right  Result Date: 07/17/2019 First MTP, PIP and DIP narrowing was noted.  No MTP, intertarsal or tibiotalar joint space narrowing was noted.  Juxta-articular osteopenia was noted.  Inferior and posterior calcaneal spurs were noted.  No erosive changes were noted.  No radiographic progression was noted when compared to the x-rays of 2018. Impression: These findings are consistent with rheumatoid arthritis and osteoarthritis overlap.  XR Hand 2 View Left  Result Date: 07/17/2019 Juxta-articular osteopenia was noted.  PIP and DIP narrowing was noted.  CMC narrowing was noted.  No significant intercarpal or radiocarpal joint space narrowing was noted.  No erosive changes were noted.  No radiographic progression was noted when compared to the x-rays of 2018. Impression: These findings are consistent with rheumatoid arthritis and osteoarthritis overlap.  XR Hand 2 View Right  Result Date: 07/17/2019 Juxta-articular osteopenia was noted.  PIP and DIP narrowing was noted.  Intercarpal and radiocarpal joint space narrowing was noted.  No radiographic progression was noted when compared to the x-rays of July 13, 2016.  No erosive changes were noted. Impression: These findings are consistent with rheumatoid arthritis and osteoarthritis overlap.   Recent Labs: Lab Results  Component Value Date   WBC 5.1 05/30/2019   HGB 15.2 05/30/2019   PLT 333 05/30/2019   NA 143 05/30/2019   K 3.8 05/30/2019   CL 103 05/30/2019   CO2 29 05/30/2019   GLUCOSE 114 (H) 05/30/2019   BUN 12 05/30/2019    CREATININE 0.72 05/30/2019   BILITOT 0.4 05/30/2019   ALKPHOS 54 04/18/2019   AST 25 05/30/2019   ALT 21 05/30/2019   PROT 7.2 05/30/2019   ALBUMIN 4.1 04/18/2019   CALCIUM 10.4 05/30/2019   GFRAA 102 05/30/2019   QFTBGOLDPLUS NEGATIVE 04/05/2019    Speciality Comments: No specialty comments available.  Procedures:  No procedures performed Allergies: Demerol [meperidine], Lisinopril, Meperidine hcl, and Pentazocine lactate   Assessment / Plan:  Visit Diagnoses: Rheumatoid arthritis involving multiple sites with positive rheumatoid factor (HCC) - +RF.  She has done very well on [place].  She denies having any joint swelling.  She has had some joint stiffness.  She states she takes tramadol only once a day now.  We obtained x-rays of her hands and feet for comparison.  No radiographic progression was noted.- Plan: XR Hand 2 View Right, XR Hand 2 View Left, XR Foot 2 Views Right, XR Foot 2 Views Left  High risk medication use - Arava 20 mg p.o. daily. previously on Methotrexate 0.8 ml every 7 days and folic acid 1 mg 2 tablets daily.  Her labs are stable.  We will continue to monitor labs every 3 months.  Therapeutic drug monitoring -she takes tramadol once a day now.  Tapering prednisone as tolerated was discussed.. UDS: 12/21/2018, narcotic agreement: 05/22/2018  - Plan: DRUG MONITOR, TRAMADOL,QN, URINE, DRUG MONITOR, PANEL 5, W/CONF, URINE  Psoriasis - she had a mild psoriasis flare as she came off methotrexate.  She has 1 lesion which is been persistent.  She has been using topical agents.  Primary osteoarthritis of both knees-doing better.  Primary osteoarthritis of both feet-she is some stiffness but no discomfort.  Sicca syndrome (HCC) - pilocarpine 5 mg 1 tablet by mouth TID PRN for symptomatic relief.  Patient has been recently diagnosed with asthma.  She has been followed by Dr. Patsey Berthold.  Have advised her to discontinue pilocarpine.  History of asthma-patient has been diagnosed  with asthma by Dr. Patsey Berthold.  I have advised her to discontinue pilocarpine.  History of hypothyroidism  History of migraine  History of hyperlipidemia  History of gastroesophageal reflux (GERD)  Malignant neoplasm of lower-outer quadrant of right breast of female, estrogen receptor positive ([place])  Orders: Orders Placed This Encounter  Procedures  . XR Hand 2 View Right  . XR Hand 2 View Left  . XR Foot 2 Views Right  . XR Foot 2 Views Left  . DRUG MONITOR, TRAMADOL,QN, URINE  . DRUG MONITOR, PANEL 5, W/CONF, URINE   No orders of the defined types were placed in this encounter.    Follow-Up Instructions: Return in about 5 months (around 12/17/2019) for Rheumatoid arthritis, Osteoarthritis.   Bo Merino, MD  Note - This record has been created using Editor, commissioning.  Chart creation errors have been sought, but may not always  have been located. Such creation errors do not reflect on  the standard of medical care.

## 2019-07-10 ENCOUNTER — Ambulatory Visit: Payer: PPO | Admitting: Rheumatology

## 2019-07-16 DIAGNOSIS — H40013 Open angle with borderline findings, low risk, bilateral: Secondary | ICD-10-CM | POA: Diagnosis not present

## 2019-07-16 DIAGNOSIS — H16223 Keratoconjunctivitis sicca, not specified as Sjogren's, bilateral: Secondary | ICD-10-CM | POA: Diagnosis not present

## 2019-07-16 DIAGNOSIS — H04123 Dry eye syndrome of bilateral lacrimal glands: Secondary | ICD-10-CM | POA: Diagnosis not present

## 2019-07-16 LAB — HM DIABETES EYE EXAM

## 2019-07-17 ENCOUNTER — Ambulatory Visit: Payer: Self-pay

## 2019-07-17 ENCOUNTER — Encounter: Payer: Self-pay | Admitting: Rheumatology

## 2019-07-17 ENCOUNTER — Ambulatory Visit: Payer: PPO | Admitting: Rheumatology

## 2019-07-17 ENCOUNTER — Other Ambulatory Visit: Payer: Self-pay

## 2019-07-17 VITALS — BP 131/72 | HR 106 | Resp 15 | Ht 68.0 in | Wt 210.0 lb

## 2019-07-17 DIAGNOSIS — M0579 Rheumatoid arthritis with rheumatoid factor of multiple sites without organ or systems involvement: Secondary | ICD-10-CM | POA: Diagnosis not present

## 2019-07-17 DIAGNOSIS — M19071 Primary osteoarthritis, right ankle and foot: Secondary | ICD-10-CM | POA: Diagnosis not present

## 2019-07-17 DIAGNOSIS — Z8719 Personal history of other diseases of the digestive system: Secondary | ICD-10-CM

## 2019-07-17 DIAGNOSIS — M3500 Sicca syndrome, unspecified: Secondary | ICD-10-CM

## 2019-07-17 DIAGNOSIS — M79672 Pain in left foot: Secondary | ICD-10-CM | POA: Diagnosis not present

## 2019-07-17 DIAGNOSIS — Z5181 Encounter for therapeutic drug level monitoring: Secondary | ICD-10-CM | POA: Diagnosis not present

## 2019-07-17 DIAGNOSIS — M79671 Pain in right foot: Secondary | ICD-10-CM | POA: Diagnosis not present

## 2019-07-17 DIAGNOSIS — L409 Psoriasis, unspecified: Secondary | ICD-10-CM | POA: Diagnosis not present

## 2019-07-17 DIAGNOSIS — Z79899 Other long term (current) drug therapy: Secondary | ICD-10-CM | POA: Diagnosis not present

## 2019-07-17 DIAGNOSIS — Z8639 Personal history of other endocrine, nutritional and metabolic disease: Secondary | ICD-10-CM

## 2019-07-17 DIAGNOSIS — M79642 Pain in left hand: Secondary | ICD-10-CM | POA: Diagnosis not present

## 2019-07-17 DIAGNOSIS — M170 Bilateral primary osteoarthritis of knee: Secondary | ICD-10-CM | POA: Diagnosis not present

## 2019-07-17 DIAGNOSIS — Z170 Estrogen receptor positive status [ER+]: Secondary | ICD-10-CM

## 2019-07-17 DIAGNOSIS — C50511 Malignant neoplasm of lower-outer quadrant of right female breast: Secondary | ICD-10-CM | POA: Diagnosis not present

## 2019-07-17 DIAGNOSIS — M79641 Pain in right hand: Secondary | ICD-10-CM | POA: Diagnosis not present

## 2019-07-17 DIAGNOSIS — Z8709 Personal history of other diseases of the respiratory system: Secondary | ICD-10-CM | POA: Diagnosis not present

## 2019-07-17 DIAGNOSIS — Z8669 Personal history of other diseases of the nervous system and sense organs: Secondary | ICD-10-CM

## 2019-07-17 DIAGNOSIS — M19072 Primary osteoarthritis, left ankle and foot: Secondary | ICD-10-CM

## 2019-07-17 NOTE — Patient Instructions (Signed)
Standing Labs We placed an order today for your standing lab work.   Please have your standing labs drawn in August (CBC with differential and CMP with GFR )and every 3 months  If possible, please have your labs drawn 2 weeks prior to your appointment so that the provider can discuss your results at your appointment.  We have open lab daily Monday through Thursday from 8:30-12:30 PM and 1:30-4:30 PM and Friday from 8:30-12:30 PM and 1:30-4:00 PM at the office of Dr. Bo Merino, [facility].   You may experience shorter wait times on Monday and Friday afternoons. The office is located at [address] [place], [place], [place] [address] No appointment is necessary.   Labs are drawn by [facility].  You may receive a bill from [facility] for your lab work.  If you wish to have your labs drawn at another location, please call the office 24 hours in advance to send orders.  If you have any questions regarding directions or hours of operation,  please call [phone].   As a reminder, please drink plenty of water prior to coming for your lab work. Thanks!

## 2019-07-19 LAB — DRUG MONITOR, PANEL 5, W/CONF, URINE
Amphetamines: NEGATIVE ng/mL (ref <500)
Barbiturates: NEGATIVE ng/mL (ref <300)
Benzodiazepines: NEGATIVE ng/mL (ref <100)
Cocaine Metabolite: NEGATIVE ng/mL (ref <150)
Codeine: NEGATIVE ng/mL (ref <50)
Creatinine: 50.8 mg/dL
Hydrocodone: 216 ng/mL — ABNORMAL HIGH (ref <50)
Hydromorphone: 65 ng/mL — ABNORMAL HIGH (ref <50)
Marijuana Metabolite: NEGATIVE ng/mL (ref <20)
Methadone Metabolite: NEGATIVE ng/mL (ref <100)
Morphine: NEGATIVE ng/mL (ref <50)
Norhydrocodone: 394 ng/mL — ABNORMAL HIGH (ref <50)
Opiates: POSITIVE ng/mL — AB (ref <100)
Oxidant: NEGATIVE ug/mL
Oxycodone: NEGATIVE ng/mL (ref <100)
pH: 6.5 (ref 4.5–9.0)

## 2019-07-19 LAB — DM TEMPLATE

## 2019-07-19 LAB — DRUG MONITOR, TRAMADOL,QN, URINE
Desmethyltramadol: NEGATIVE ng/mL (ref <100)
Tramadol: 205 ng/mL — ABNORMAL HIGH (ref <100)

## 2019-07-19 NOTE — Progress Notes (Signed)
Consistent with tramadol use

## 2019-07-22 NOTE — Progress Notes (Signed)
  Radiation Oncology         [phone] ________________________________  Name: Cheryl Knight MRN: [id number]  Date: 06/04/2019  DOB: [birth year]  End of Treatment Note  Diagnosis:   right-sided breast cancer     Indication for treatment:  Curative       Radiation treatment dates:   05/08/19 - 06/04/19  Site/dose:   The patient initially received a dose of 42.56 Gy in 16 fractions to the breast using whole-breast tangent fields. This was delivered using a 3-D conformal technique. The patient then received a boost to the seroma. This delivered an additional 8 Gy in 79fractions using a 3 field photon technique due to the depth of the seroma. The total dose was 50.56 Gy.  Narrative: The patient tolerated radiation treatment relatively well.   The patient had some expected skin irritation as she progressed during treatment.   Plan: The patient has completed radiation treatment. The patient will return to radiation oncology clinic for routine followup in one month. I advised the patient to call or return sooner if they have any questions or concerns related to their recovery or treatment. ________________________________  Jodelle Gross, M.D., Ph.D.

## 2019-08-01 ENCOUNTER — Telehealth: Payer: Self-pay | Admitting: Radiation Oncology

## 2019-08-01 NOTE — Telephone Encounter (Signed)
  Radiation Oncology         [phone] ________________________________  Name: Cheryl Knight MRN: [id number]  Date of Service: 08/06/19   DOB: 13-Jan-1956  Post Treatment Telephone Note  Diagnosis:   Stage IA, pT1bN0M0 grade 1, ER/PR positive invasive ductal carcinoma of the right breast.  Interval Since Last Radiation: 8 weeks   05/08/19-06/04/19: The right breast was treated to 42.56 Gy in 16 fractions followed by an 8 Gy Boost over 4 fractions.  Narrative:  The patient was contacted today for routine follow-up. During treatment she did very well with radiotherapy and did not have significant desquamation. She reports she is doing well. She has had some concerns with dry skin in the nipple area but no other skin difficulties.  Impression/Plan: 1. Stage IA, pT1bN0M0 grade 1, ER/PR positive invasive ductal carcinoma of the right breast. The patient has been doing well since completion of radiotherapy. We discussed that we would be happy to continue to follow her as needed, but she will also continue to follow up with Dr. Burr Medico in medical oncology. She was counseled on skin care as well as measures to avoid sun exposure to this area.  2. Survivorship. We discussed the importance of survivorship evaluation and encouraged her to attend her upcoming visit with that clinic.    Carola Rhine, PAC

## 2019-08-03 ENCOUNTER — Other Ambulatory Visit: Payer: Self-pay | Admitting: Rheumatology

## 2019-08-03 NOTE — Telephone Encounter (Signed)
Last Visit: 07/17/2019 Next Visit: 12/18/2019  Okay to refill per Dr. Estanislado Pandy

## 2019-08-05 ENCOUNTER — Telehealth: Payer: Self-pay | Admitting: Internal Medicine

## 2019-08-05 ENCOUNTER — Encounter: Payer: Self-pay | Admitting: Internal Medicine

## 2019-08-05 DIAGNOSIS — H11009 Unspecified pterygium of unspecified eye: Secondary | ICD-10-CM | POA: Insufficient documentation

## 2019-08-05 DIAGNOSIS — I7090 Unspecified atherosclerosis: Secondary | ICD-10-CM | POA: Insufficient documentation

## 2019-08-05 DIAGNOSIS — H409 Unspecified glaucoma: Secondary | ICD-10-CM | POA: Insufficient documentation

## 2019-08-05 DIAGNOSIS — H35039 Hypertensive retinopathy, unspecified eye: Secondary | ICD-10-CM | POA: Insufficient documentation

## 2019-08-05 DIAGNOSIS — L718 Other rosacea: Secondary | ICD-10-CM | POA: Insufficient documentation

## 2019-08-05 DIAGNOSIS — H04129 Dry eye syndrome of unspecified lacrimal gland: Secondary | ICD-10-CM | POA: Insufficient documentation

## 2019-08-05 NOTE — Telephone Encounter (Signed)
Fax A1C to Dr. Monna Fam pt has eye exam in 3 months  Can they check for DM eye exam ?  Results for Cheryl Knight, Cheryl Knight (MRN [id number]) as of 08/05/2019 21:48  Ref. Range 04/18/2019 08:01  Hemoglobin A1C Latest Ref Range: 4.6 - 6.5 % 6.9 (H)    Fax # [phone]

## 2019-08-06 ENCOUNTER — Encounter: Payer: Self-pay | Admitting: Pulmonary Disease

## 2019-08-06 ENCOUNTER — Ambulatory Visit: Payer: PPO | Admitting: Pulmonary Disease

## 2019-08-06 ENCOUNTER — Other Ambulatory Visit: Payer: Self-pay

## 2019-08-06 VITALS — BP 130/78 | HR 95 | Temp 97.7°F | Ht 68.0 in | Wt 207.0 lb

## 2019-08-06 DIAGNOSIS — K219 Gastro-esophageal reflux disease without esophagitis: Secondary | ICD-10-CM

## 2019-08-06 DIAGNOSIS — R05 Cough: Secondary | ICD-10-CM | POA: Diagnosis not present

## 2019-08-06 DIAGNOSIS — E6609 Other obesity due to excess calories: Secondary | ICD-10-CM | POA: Diagnosis not present

## 2019-08-06 DIAGNOSIS — C50511 Malignant neoplasm of lower-outer quadrant of right female breast: Secondary | ICD-10-CM

## 2019-08-06 DIAGNOSIS — R059 Cough, unspecified: Secondary | ICD-10-CM

## 2019-08-06 DIAGNOSIS — Z170 Estrogen receptor positive status [ER+]: Secondary | ICD-10-CM | POA: Diagnosis not present

## 2019-08-06 DIAGNOSIS — M0579 Rheumatoid arthritis with rheumatoid factor of multiple sites without organ or systems involvement: Secondary | ICD-10-CM | POA: Diagnosis not present

## 2019-08-06 DIAGNOSIS — R0982 Postnasal drip: Secondary | ICD-10-CM

## 2019-08-06 DIAGNOSIS — E66811 Obesity, class 1: Secondary | ICD-10-CM

## 2019-08-06 DIAGNOSIS — Z6832 Body mass index (BMI) 32.0-32.9, adult: Secondary | ICD-10-CM

## 2019-08-06 MED ORDER — AZELASTINE HCL 0.1 % NA SOLN: 1.0000 | Freq: Two times a day (BID) | NASAL | 12 refills | Status: DC

## 2019-08-06 NOTE — Patient Instructions (Addendum)
I have sent a prescription for Astelin to your pharmacy this will be 1 spray to each nostril twice a day.  Make sure you lean forward some what while putting the medication and so it does not cause too much of a sour taste in the back of the throat.  We will check on the results of your endoscopy.  You may discontinue Symbicort.  Stop the fish oil supplementation as this may aggravate reflux.  We will see him in follow-up in 4 to 6 weeks time call sooner should any new difficulties arise.

## 2019-08-06 NOTE — Progress Notes (Signed)
Subjective:    Patient ID: Cheryl Knight, female    DOB: Sep 29, 1955, 64 y.o.   MRN: [id number]  HPI Keshana is a 63 year old smoker (7 pack years) who presents for follow-up after her initial visit here 05 Jun 2019 for evaluation of cough.  She has had a cough approximately for 2 years.  At the time of her initial evaluation she had noted that she was doing better on Symbicort and albuterol that have been prescribed to her by her primary care physician.  However after a brief respite the cough has recurred, it is usually worse at the end of the day.  Patient also notices that this is worse in association with her gastroesophageal reflux symptoms.  She is to have upper endoscopy next week.  She had pulmonary function testing performed on 04 July 2019 which was essentially normal.  She is not on ACE inhibitors.  She has been on medications for control of her rheumatoid arthritis that can sometimes create pulmonary toxicity however her pulmonary function testing was normal particularly with regards to her diffusion capacity.  This does not suggest rheumatoid lung nor medication toxicity.  She has had issues with postnasal drip.  She had previously been on steroid inhalers however these caused issues with epistaxis.  Interestingly she started tamoxifen for her breast cancer diagnosis, this medication can aggravate cough.  However, the alternatives for her would aggravate joint pain from RA.  She has not had any fevers, chills or sweats.  No sputum production and no hemoptysis.  Review of Systems A 10 point review of systems was performed and it is as noted above otherwise negative.  Allergies  Allergen Reactions  . Demerol [Meperidine]   . Lisinopril Cough  . Meperidine Hcl     REACTION: vomiting  . Pentazocine Lactate     REACTION: hallucinations   Current Meds  Medication Sig  . albuterol (VENTOLIN HFA) 108 (90 Base) MCG/ACT inhaler Inhale 1-2 puffs into the lungs every 6 (six) hours as  needed for wheezing or shortness of breath.  . betamethasone dipropionate (DIPROLENE) 0.05 % cream Apply topically 2 (two) times daily.  . budesonide-formoterol (SYMBICORT) 160-4.5 MCG/ACT inhaler Inhale 2 puffs into the lungs 2 (two) times daily. Rinse mouth  . Calcium Carbonate-Vitamin D (CALCIUM PLUS VITAMIN D PO) Take by mouth.  . chlorpheniramine-HYDROcodone (TUSSIONEX PENNKINETIC ER) 10-8 MG/5ML SUER Take 5 mLs by mouth at bedtime as needed for cough.  . cyclobenzaprine (FLEXERIL) 10 MG tablet Take 1 tablet (10 mg total) by mouth daily.  . fish oil-omega-3 fatty acids 1000 MG capsule Take 1 g by mouth daily.  Marland Kitchen glucosamine-chondroitin 500-400 MG tablet Take 1 tablet by mouth 3 (three) times daily.  . hydrocortisone cream 1 % Apply 1 application topically.  Marland Kitchen leflunomide (ARAVA) 20 MG tablet Take 1 tablet (20 mg total) by mouth daily.  Marland Kitchen levothyroxine (SYNTHROID) 100 MCG tablet Daily 30 minutes before food  . loratadine (CLARITIN) 10 MG tablet Take 10 mg by mouth daily.  Marland Kitchen olmesartan-hydrochlorothiazide (BENICAR HCT) 20-12.5 MG tablet Take 1 tablet by mouth daily. In am  . omeprazole (PRILOSEC) 10 MG capsule Take 20 mg by mouth in the morning and at bedtime.  . pravastatin (PRAVACHOL) 40 MG tablet Take 1 tablet (40 mg total) by mouth at bedtime.  . promethazine (PHENERGAN) 25 MG tablet Take 1 tablet (25 mg total) by mouth every 6 (six) hours as needed.  . RESTASIS 0.05 % ophthalmic emulsion daily.   . SUMAtriptan (  IMITREX) 50 MG tablet TAKE 1 TABLET BY MOUTH AS NEEDED FOR HEADACHE  . tamoxifen (NOLVADEX) 20 MG tablet Take 1 tablet (20 mg total) by mouth daily.  Marland Kitchen topiramate (TOPAMAX) 100 MG tablet Take 1 tablet (100 mg total) by mouth daily.  . traMADol (ULTRAM) 50 MG tablet Take 2 tablets by mouth twice daily as needed for pain relief.  . [DISCONTINUED] pilocarpine (SALAGEN) 5 MG tablet TAKE 1 TABLET BY MOUTH THREE TIMES DAILY   Immunization History  Administered Date(s) Administered    . H1N1 02/27/2008  . Influenza-Unspecified 11/18/2017  . PFIZER SARS-COV-2 Vaccination 03/02/2019, 03/29/2019  . Td 01/19/2004  . Tdap 12/17/2014      Objective:   Physical Exam BP 130/78 (BP Location: Left Arm, Cuff Size: Normal)   Pulse 95   Temp 97.7 F (36.5 C) (Temporal)   Ht 5\' 8"  (1.727 m)   Wt 207 lb (93.9 kg)   SpO2 100%   BMI 31.47 kg/m   GENERAL:  Overweight woman, no acute distress.  Fully ambulatory. HEAD: Normocephalic, atraumatic.  EYES: Pupils equal, round, reactive to light.  No scleral icterus.  MOUTH: Nose/mouth/throat not examined due to masking requirements for COVID 19. NECK: Supple. No thyromegaly. Trachea midline. No JVD.  No adenopathy. PULMONARY: Good air entry bilaterally, no adventitious sounds.   CARDIOVASCULAR: S1 and S2. Regular rate and rhythm.  Grade 1/6 systolic ejection murmur left sternal border, unchanged from prior. GASTROINTESTINAL: Obese, benign. MUSCULOSKELETAL: No joint deformity, no clubbing, no edema.  NEUROLOGIC: Awake, alert, oriented, fully ambulatory no gait disturbance noted.  Speech is fluent, no overt focal deficits. SKIN: Intact,warm,dry.  Limited exam shows no rashes. PSYCH: Mood and behavior appropriate.     Assessment & Plan:     ICD-10-CM   1. Cough  R05    Present for over 2 years Transient improvement now worse Normal PFT Query due to GERD/medications/PNDS  2. Gastroesophageal reflux disease, unspecified whether esophagitis present  K21.9    Query driver for cough To have EGD, may need esophageal dilatation Continue PPI and antireflux measures  3. Post-nasal drip  R09.82    May be aggravating cough Trial of azelastine Did not tolerate nasal corticosteroids  4. Rheumatoid arthritis involving multiple sites with positive rheumatoid factor (HCC)  M05.79    This issue adds complexity to her management No evidence of rheumatoid lung  5. Malignant neoplasm of lower-outer quadrant of right breast of female,  estrogen receptor positive ([place])  C50.511    Z17.0    This issue adds complexity to her management Tamoxifen may aggravate cough  6. Class 1 obesity due to excess calories without serious comorbidity with body mass index (BMI) of 32.0 to 32.9 in adult  E66.09    Z68.32    This issue adds complexity to her management May aggravate GERD which in turn may aggravate cough   Meds ordered this encounter  Medications  . azelastine (ASTELIN) 0.1 % nasal spray    Sig: Place 1 spray into both nostrils 2 (two) times daily.    Dispense:  30 mL    Refill:  12   Discussion:  Patient has had cough of longstanding.  Initially bronchodilators seem to help somewhat however cough has aggravated again.  In the interim however she has also started tamoxifen which has a side effect of cough.  However, the alternative for the patient is a different from a aggravated her joint pain from RA.  Pulmonary function testing was entirely normal in spirometry,  lung volumes and diffusion capacity.  She has had significant postnasal drip but does not tolerate nasal corticosteroids.  She will be given a trial of azelastine to see if this helps with her symptoms.  Not on ACE inhibitors.  She was given the go ahead to discontinue Symbicort and see how she does off of this medication.  She is to have EGD for evaluation of her severe reflux.  She has had issues with esophageal stricture previously and may need repeat dilation.  Of note Fishel supplements can aggravate gastroesophageal reflux so I have recommended her to stop taking these for now.  We will see the patient in follow-up in 4 to 6 weeks time she is to call us sooner should any new difficulties arise.   Renold Don, MD [facility]   *This note was dictated using voice recognition software/Dragon.  Despite best efforts to proofread, errors can occur which can change the meaning.  Any change was purely unintentional.

## 2019-08-06 NOTE — Telephone Encounter (Signed)
Faxed via epic routing

## 2019-08-10 DIAGNOSIS — Z01812 Encounter for preprocedural laboratory examination: Secondary | ICD-10-CM | POA: Diagnosis not present

## 2019-08-14 DIAGNOSIS — K5730 Diverticulosis of large intestine without perforation or abscess without bleeding: Secondary | ICD-10-CM | POA: Diagnosis not present

## 2019-08-14 DIAGNOSIS — K228 Other specified diseases of esophagus: Secondary | ICD-10-CM | POA: Diagnosis not present

## 2019-08-14 DIAGNOSIS — J45909 Unspecified asthma, uncomplicated: Secondary | ICD-10-CM | POA: Diagnosis not present

## 2019-08-14 DIAGNOSIS — I10 Essential (primary) hypertension: Secondary | ICD-10-CM | POA: Diagnosis not present

## 2019-08-14 DIAGNOSIS — D123 Benign neoplasm of transverse colon: Secondary | ICD-10-CM | POA: Diagnosis not present

## 2019-08-14 DIAGNOSIS — K2900 Acute gastritis without bleeding: Secondary | ICD-10-CM | POA: Diagnosis not present

## 2019-08-14 DIAGNOSIS — K208 Other esophagitis without bleeding: Secondary | ICD-10-CM | POA: Diagnosis not present

## 2019-08-14 DIAGNOSIS — Z1211 Encounter for screening for malignant neoplasm of colon: Secondary | ICD-10-CM | POA: Diagnosis not present

## 2019-08-14 DIAGNOSIS — K222 Esophageal obstruction: Secondary | ICD-10-CM | POA: Diagnosis not present

## 2019-08-14 DIAGNOSIS — R1310 Dysphagia, unspecified: Secondary | ICD-10-CM | POA: Diagnosis not present

## 2019-08-14 DIAGNOSIS — K449 Diaphragmatic hernia without obstruction or gangrene: Secondary | ICD-10-CM | POA: Diagnosis not present

## 2019-08-14 DIAGNOSIS — K635 Polyp of colon: Secondary | ICD-10-CM | POA: Diagnosis not present

## 2019-08-14 DIAGNOSIS — K209 Esophagitis, unspecified without bleeding: Secondary | ICD-10-CM | POA: Diagnosis not present

## 2019-08-14 DIAGNOSIS — K219 Gastro-esophageal reflux disease without esophagitis: Secondary | ICD-10-CM | POA: Diagnosis not present

## 2019-08-14 DIAGNOSIS — K2970 Gastritis, unspecified, without bleeding: Secondary | ICD-10-CM | POA: Diagnosis not present

## 2019-08-14 DIAGNOSIS — K640 First degree hemorrhoids: Secondary | ICD-10-CM | POA: Diagnosis not present

## 2019-08-14 DIAGNOSIS — D126 Benign neoplasm of colon, unspecified: Secondary | ICD-10-CM | POA: Diagnosis not present

## 2019-08-14 LAB — HM COLONOSCOPY

## 2019-08-16 ENCOUNTER — Ambulatory Visit (INDEPENDENT_AMBULATORY_CARE_PROVIDER_SITE_OTHER): Payer: PPO

## 2019-08-16 ENCOUNTER — Other Ambulatory Visit: Payer: Self-pay

## 2019-08-16 ENCOUNTER — Ambulatory Visit (INDEPENDENT_AMBULATORY_CARE_PROVIDER_SITE_OTHER): Payer: PPO | Admitting: Internal Medicine

## 2019-08-16 ENCOUNTER — Encounter: Payer: Self-pay | Admitting: Internal Medicine

## 2019-08-16 VITALS — BP 124/76 | HR 92 | Temp 98.0°F | Ht 68.0 in | Wt 205.8 lb

## 2019-08-16 DIAGNOSIS — M25531 Pain in right wrist: Secondary | ICD-10-CM

## 2019-08-16 DIAGNOSIS — K449 Diaphragmatic hernia without obstruction or gangrene: Secondary | ICD-10-CM | POA: Insufficient documentation

## 2019-08-16 DIAGNOSIS — R05 Cough: Secondary | ICD-10-CM

## 2019-08-16 DIAGNOSIS — M069 Rheumatoid arthritis, unspecified: Secondary | ICD-10-CM

## 2019-08-16 DIAGNOSIS — E785 Hyperlipidemia, unspecified: Secondary | ICD-10-CM

## 2019-08-16 DIAGNOSIS — K209 Esophagitis, unspecified without bleeding: Secondary | ICD-10-CM | POA: Diagnosis not present

## 2019-08-16 DIAGNOSIS — R053 Chronic cough: Secondary | ICD-10-CM | POA: Insufficient documentation

## 2019-08-16 DIAGNOSIS — M7989 Other specified soft tissue disorders: Secondary | ICD-10-CM | POA: Diagnosis not present

## 2019-08-16 DIAGNOSIS — E669 Obesity, unspecified: Secondary | ICD-10-CM | POA: Diagnosis not present

## 2019-08-16 MED ORDER — METHYLPREDNISOLONE ACETATE 40 MG/ML IJ SUSP
40.0000 mg | Freq: Once | INTRAMUSCULAR | Status: AC
  Administered 2019-08-16: 40 mg via INTRAMUSCULAR

## 2019-08-16 MED ORDER — PRAVASTATIN SODIUM 20 MG PO TABS: 20.0000 mg | ORAL_TABLET | Freq: Every day | ORAL | 3 refills | Status: DC

## 2019-08-16 MED ORDER — PREDNISONE 20 MG PO TABS: 40.0000 mg | ORAL_TABLET | Freq: Every day | ORAL | 0 refills | Status: DC

## 2019-08-16 MED ORDER — OXYCODONE-ACETAMINOPHEN 5-325 MG PO TABS: 1.0000 | ORAL_TABLET | Freq: Two times a day (BID) | ORAL | 0 refills | Status: DC | PRN

## 2019-08-16 NOTE — Progress Notes (Signed)
Chief Complaint  Patient presents with  . Wrist Pain   F/u  1. Right wrist pain 8/10 noted Wednesday after endoscopy colonoscopy she thinks they placed her on her right side and wrist is painful and swollen with movements external rotation, extension/flexion. Now pain less today 3/10 rest and 8/10 movement tried ice which helped heat not so much  2. Rheumatoid arthritis on arava per rheumatology and has helped knees with chronic cough productive and nonproductive worse at the end of the day inhalers did not help in the past and wheezing CXR 07/2017 negative and pfts 06/2019 negative I would consider high resolution CT to r/o ILD or changes from RA in lungs  F/u 09/18/19 with Dr. Patsey Berthold  3. egd saw hiatal hernia, esophagitis and polyp had 08/14/19 with GI and pending path  Review of Systems  Respiratory: Positive for cough, sputum production and wheezing.   Cardiovascular: Negative for chest pain.  Musculoskeletal: Positive for joint pain.   Past Medical History:  Diagnosis Date  . Cancer ([address]) 02/2019   right breast IDC  . Cataracts, bilateral    tbd surgery 2021  . Complication of anesthesia   . Cough   . Family history of breast cancer   . Family history of lung cancer   . Family history of prostate cancer   . Family history of skin cancer   . GERD (gastroesophageal reflux disease)   . Headache   . High cholesterol   . Hypertension   . Hypothyroidism   . Irritable bowel   . Osteoarthritis    i.e right knee   . PONV (postoperative nausea and vomiting)   . Pre-diabetes   . Rheumatoid arthritis ([place])   . Thyroid disease    Past Surgical History:  Procedure Laterality Date  . ABDOMINAL HYSTERECTOMY     age 64 ovaries out  . BREAST LUMPECTOMY WITH RADIOACTIVE SEED AND SENTINEL LYMPH NODE BIOPSY Right 03/28/2019   Procedure: RIGHT BREAST LUMPECTOMY WITH RADIOACTIVE SEED AND SENTINEL LYMPH NODE BIOPSY;  Surgeon: Donnie Mesa, MD;  Location: [facility];  Service:  General;  Laterality: Right;  PEC BLOCK FOR POST OP PAIN  . CATARACT EXTRACTION, BILATERAL    . ESOPHAGEAL DILATION     Dr. Olevia Perches 15 years from 2021  . TONSILLECTOMY    . TUBAL LIGATION     Family History  Problem Relation Age of Onset  . Hypertension Mother   . Thyroid disease Mother   . Breast cancer Mother 57       breast dx age 38 died age 76   . Prostate cancer Father 71       metastatic  . Stroke Maternal Grandmother   . Skin cancer Maternal Uncle        dx. in his 36s  . Lung cancer Maternal Uncle        smoker, died age 68   Social History   Socioeconomic History  . Marital status: Married    Spouse name: Not on file  . Number of children: 2  . Years of education: Not on file  . Highest education level: Not on file  Occupational History  . Occupation: nurse  Tobacco Use  . Smoking status: Former Smoker    Packs/day: 0.25    Years: 7.00    Pack years: 1.75    Types: Cigarettes    Quit date: 2000    Years since quitting: 21.5  . Smokeless tobacco: Never Used  Vaping Use  .  Vaping Use: Never used  Substance and Sexual Activity  . Alcohol use: Yes    Comment: socially   . Drug use: Never  . Sexual activity: Yes  Other Topics Concern  . 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 Difficulty of Paying Living Expenses:   Food Insecurity:   . Worried About Charity fundraiser in the Last Year:   . Arboriculturist in the Last Year:   Transportation Needs:   . Film/video editor (Medical):   Marland Kitchen Lack of Transportation (Non-Medical):   Physical Activity:   . Days of Exercise per Week:   . Minutes of Exercise per Session:   Stress:   . Feeling of Stress :   Social Connections:   . Frequency of Communication with Friends and Family:   . Frequency of Social Gatherings with Friends and Family:   . Attends Religious Services:   . Active Member of  Clubs or Organizations:   . Attends Archivist Meetings:   Marland Kitchen Marital Status:   Intimate Partner Violence:   . Fear of Current or Ex-Partner:   . Emotionally Abused:   Marland Kitchen Physically Abused:   . Sexually Abused:    Current Meds  Medication Sig  . azelastine (ASTELIN) 0.1 % nasal spray Place 1 spray into both nostrils 2 (two) times daily.  . betamethasone dipropionate (DIPROLENE) 0.05 % cream Apply topically 2 (two) times daily.  . Calcium Carbonate-Vitamin D (CALCIUM PLUS VITAMIN D PO) Take by mouth.  . cyclobenzaprine (FLEXERIL) 10 MG tablet Take 1 tablet (10 mg total) by mouth daily.  . Esomeprazole Magnesium (NEXIUM PO) Take by mouth.  Marland Kitchen glucosamine-chondroitin 500-400 MG tablet Take 1 tablet by mouth 3 (three) times daily.  . hydrocortisone cream 1 % Apply 1 application topically.  Marland Kitchen leflunomide (ARAVA) 20 MG tablet Take 1 tablet (20 mg total) by mouth daily.  Marland Kitchen levothyroxine (SYNTHROID) 100 MCG tablet Daily 30 minutes before food  . loratadine (CLARITIN) 10 MG tablet Take 10 mg by mouth daily.  Marland Kitchen olmesartan-hydrochlorothiazide (BENICAR HCT) 20-12.5 MG tablet Take 1 tablet by mouth daily. In am  . pravastatin (PRAVACHOL) 20 MG tablet Take 1 tablet (20 mg total) by mouth at bedtime.  . promethazine (PHENERGAN) 25 MG tablet Take 1 tablet (25 mg total) by mouth every 6 (six) hours as needed.  . RESTASIS 0.05 % ophthalmic emulsion daily.   . SUMAtriptan (IMITREX) 50 MG tablet TAKE 1 TABLET BY MOUTH AS NEEDED FOR HEADACHE  . tamoxifen (NOLVADEX) 20 MG tablet Take 1 tablet (20 mg total) by mouth daily.  Marland Kitchen topiramate (TOPAMAX) 100 MG tablet Take 1 tablet (100 mg total) by mouth daily.  . traMADol (ULTRAM) 50 MG tablet Take 2 tablets by mouth twice daily as needed for pain relief.  . [DISCONTINUED] pravastatin (PRAVACHOL) 40 MG tablet Take 1 tablet (40 mg total) by mouth at bedtime.   Allergies  Allergen Reactions  . Demerol [Meperidine]   . Lisinopril Cough  . Meperidine Hcl       REACTION: vomiting  . Pentazocine Lactate     REACTION: hallucinations   Recent Results (from the past 2160 hour(s))  COMPLETE METABOLIC PANEL WITH GFR     Status: Abnormal   Collection Time: 05/30/19 11:26 AM  Result Value Ref Range   Glucose, Bld 114 (  H) 65 - 99 mg/dL    Comment: .            Fasting reference interval . For someone without known diabetes, a glucose value between 100 and 125 mg/dL is consistent with prediabetes and should be confirmed with a follow-up test. .    BUN 12 7 - 25 mg/dL   Creat 0.72 0.50 - 0.99 mg/dL    Comment: For patients >41 years of age, the reference limit for Creatinine is approximately 13% higher for people identified as African-American. .    GFR, Est Non African American 88 > OR = 60 mL/min/1.57m   GFR, Est African American 102 > OR = 60 mL/min/1.763m  BUN/Creatinine Ratio NOT APPLICABLE 6 - 22 (calc)   Sodium 143 135 - 146 mmol/L   Potassium 3.8 3.5 - 5.3 mmol/L   Chloride 103 98 - 110 mmol/L   CO2 29 20 - 32 mmol/L   Calcium 10.4 8.6 - 10.4 mg/dL   Total Protein 7.2 6.1 - 8.1 g/dL   Albumin 4.5 3.6 - 5.1 g/dL   Globulin 2.7 1.9 - 3.7 g/dL (calc)   AG Ratio 1.7 1.0 - 2.5 (calc)   Total Bilirubin 0.4 0.2 - 1.2 mg/dL   Alkaline phosphatase (APISO) 60 37 - 153 U/L   AST 25 10 - 35 U/L   ALT 21 6 - 29 U/L  CBC with Differential/Platelet     Status: Abnormal   Collection Time: 05/30/19 11:26 AM  Result Value Ref Range   WBC 5.1 3.8 - 10.8 Thousand/uL   RBC 4.98 3.80 - 5.10 Million/uL   Hemoglobin 15.2 11.7 - 15.5 g/dL   HCT 46.9 (H) 35 - 45 %   MCV 94.2 80.0 - 100.0 fL   MCH 30.5 27.0 - 33.0 pg   MCHC 32.4 32.0 - 36.0 g/dL   RDW 12.5 11.0 - 15.0 %   Platelets 333 140 - 400 Thousand/uL   MPV 10.1 7.5 - 12.5 fL   Neutro Abs 2,387 1,500 - 7,800 cells/uL   Lymphs Abs 1,734 850 - 3,900 cells/uL   Absolute Monocytes 709 200 - 950 cells/uL   Eosinophils Absolute 230 15 - 500 cells/uL   Basophils Absolute 41 0 - 200 cells/uL    Neutrophils Relative % 46.8 %   Total Lymphocyte 34.0 %   Monocytes Relative 13.9 %   Eosinophils Relative 4.5 %   Basophils Relative 0.8 %  SARS CORONAVIRUS 2 (TAT 6-24 HRS) Nasopharyngeal Nasopharyngeal Swab     Status: None   Collection Time: 07/02/19  1:09 PM   Specimen: Nasopharyngeal Swab  Result Value Ref Range   SARS Coronavirus 2 NEGATIVE NEGATIVE    Comment: (NOTE) SARS-CoV-2 target nucleic acids are NOT DETECTED.  The SARS-CoV-2 RNA is generally detectable in upper and lower respiratory specimens during the acute phase of infection. Negative results do not preclude SARS-CoV-2 infection, do not rule out co-infections with other pathogens, and should not be used as the sole basis for treatment or other patient management decisions. Negative results must be combined with clinical observations, patient history, and epidemiological information. The expected result is Negative.  Fact Sheet for Patients: htSugarRoll.beFact Sheet for Healthcare Providers: hthttps://www.woods-mathews.com/This test is not yet approved or cleared by the [place] and  has been authorized for detection and/or diagnosis of SARS-CoV-2 by FDA under an Emergency Use Authorization (EUA). This EUA will remain  in effect (meaning this test can be used) for  the duration of the COVID-19 declaration under Se ction 564(b)(1) of the Act, 21 U.S.C. section 360bbb-3(b)(1), unless the authorization is terminated or revoked sooner.  Performed at [facility], [address], [place], [place] [address]   HM DIABETES EYE EXAM     Status: None   Collection Time: 07/16/19 12:00 AM  Result Value Ref Range   HM Diabetic Eye Exam No Retinopathy No Retinopathy    Comment: 07/16/19 [facility] f/u in 3 months comp  DRUG MONITOR, TRAMADOL,QN, URINE     Status: Abnormal   Collection Time: 07/17/19  2:58 PM  Result Value Ref Range   Desmethyltramadol NEGATIVE  <100 ng/mL   Tramadol 205 (H) <100 ng/mL   Tramadol Comments      Comment: See Tramadol Notes, LDT Notes  DRUG MONITOR, PANEL 5, W/CONF, URINE     Status: Abnormal   Collection Time: 07/17/19  2:58 PM  Result Value Ref Range   Amphetamines NEGATIVE <500 ng/mL   Barbiturates NEGATIVE <300 ng/mL   Benzodiazepines NEGATIVE <100 ng/mL   Cocaine Metabolite NEGATIVE <150 ng/mL   Marijuana Metabolite NEGATIVE <20 ng/mL   Methadone Metabolite NEGATIVE <100 ng/mL   Opiates POSITIVE (A) <100 ng/mL   Codeine NEGATIVE <50 ng/mL   Hydrocodone 216 (H) <50 ng/mL   Hydromorphone 65 (H) <50 ng/mL   Morphine NEGATIVE <50 ng/mL   Norhydrocodone 394 (H) <50 ng/mL   Opiates Comments      Comment: See Opiates Notes, LDT Notes   Oxycodone NEGATIVE <100 ng/mL   Creatinine 50.8 mg/dL   pH 6.5 4.5 - 9.0   Oxidant NEGATIVE mcg/mL  DM TEMPLATE     Status: None   Collection Time: 07/17/19  2:58 PM  Result Value Ref Range   Notes and Comments      Comment: This drug testing is for medical treatment only. Analysis was performed as non-forensic testing and these results should be used only by healthcare providers to render diagnosis or treatment, or to monitor progress of medical conditions. . Opiates Notes: Hydrocodone, Norhydrocodone, Hydromorphone detected is  consistent with the use of the drug Hydrocodone. . Hydromorphone detected is consistent with the use of  the drug Hydromorphone. . Hydromorphone can be a prescribed drug and is also a  metabolite of Hydrocodone. . Tramadol Notes: Tramadol detected is consistent with the use of the  drug Tramadol. . The metabolite Desmethyltramadol is not present at or  above the cutoff. . LDT Notes: Confirmation tests were developed and their analytical  performance characteristics have been determined by  [employer]. It has not been cleared or approved  by the FDA. This assay has been validated pursuant to  the CLIA regulations and is used  for clinical purposes.  . . Healthcare Providers needing Interpretation assistance,  please contact us at [phone] ([phone])  M-F, 8am to 10pm EST    Objective  Body mass index is 31.29 kg/m. Wt Readings from Last 3 Encounters:  08/16/19 (!) 205 lb 12.8 oz (93.4 kg)  08/06/19 207 lb (93.9 kg)  07/17/19 210 lb (95.3 kg)   Temp Readings from Last 3 Encounters:  08/16/19 98 F (36.7 C) (Oral)  08/06/19 97.7 F (36.5 C) (Temporal)  06/05/19 97.7 F (36.5 C) (Temporal)   BP Readings from Last 3 Encounters:  08/16/19 124/76  08/06/19 130/78  07/17/19 131/72   Pulse Readings from Last 3 Encounters:  08/16/19 92  08/06/19 95  07/17/19 (!) 106    Physical Exam  Vitals and nursing note reviewed.  Constitutional:      General: She is awake.     Appearance: Normal appearance. She is well-developed and well-groomed. She is obese.  HENT:     Head: Normocephalic and atraumatic.  Eyes:     Conjunctiva/sclera: Conjunctivae normal.     Pupils: Pupils are equal, round, and reactive to light.  Cardiovascular:     Rate and Rhythm: Normal rate and regular rhythm.     Heart sounds: Normal heart sounds. No murmur heard.   Pulmonary:     Effort: Pulmonary effort is normal.     Breath sounds: Wheezing present.     Comments: B/l lung fields wheezing throughout   Musculoskeletal:     Right wrist: Tenderness present. Decreased range of motion.       Arms:  Skin:    General: Skin is warm and dry.  Neurological:     General: No focal deficit present.     Mental Status: She is alert and oriented to person, place, and time. Mental status is at baseline.     Gait: Gait normal.  Psychiatric:        Attention and Perception: Attention and perception normal.        Mood and Affect: Mood and affect normal.        Speech: Speech normal.        Behavior: Behavior normal. Behavior is cooperative.        Thought Content: Thought content normal.        Cognition and Memory:  Cognition and memory normal.        Judgment: Judgment normal.     Assessment  Plan  Right wrist pain ? Wrist strain vs h/o RA with flare - Plan: DG Wrist Complete Right, oxyCODONE-acetaminophen (PERCOCET) 5-325 MG tablet, predniSONE (DELTASONE) 40 MG tablet x 1 week methylPREDNISolone acetate (DEPO-MEDROL) injection 40 mg x 1  Given exercises  Ice/heat voltaren gel prn   Hyperlipidemia, unspecified hyperlipidemia type - Plan: pravastatin (PRAVACHOL) 20 MG tablet, methylPREDNISolone acetate (DEPO-MEDROL) injection 40 mg  Rheumatoid arthritis involving multiple sites, unspecified whether rheumatoid factor present (HCC) -?persistent cough related to RA She had to stop MTX due to radiation due to breast cancer Arava is helping knee pain better than MTX rec high resolution CT lungs  Hiatal hernia Esophagitis Pending EGD colonoscopy results 08/14/19 GI   Persistent cough & wheezing ? ILD F/u pulm 09/18/19 rec high resolution CT  pfts normal 06/2019   Obesity (BMI 30-39.9)  rec healthy diet and exercise    HM Flu shotdeclines Tdap utd due in 2026  prevnardisc today could consider in the future pna 23disc today could consider in the future shingrixins wouldnt cover covid 19 vaccine 2/2 pfizer  Labsfasting 04/18/19 will [facility] rheumatology Dr. Estanislado Pandy [facility] saw summer 2020 f/u yearly h/o [facility] chest [facility] 2/4/21abnormal and repeat 03/09/19 right breast mass right breast rec biopsy did she get this?  Paps/p total hysterectomy due to uterine prolapse h/o abnormal pap remotely ovaries removed Colonoscopywill refer Dr. Hilarie Fredrickson [facility] she is due [place] upcoming 02/22/19 with mammo Former smoker quit 1 year ago in 2019/2020 but ocass. Light smoking 2x per year socially Pending cataract surgery 02/13/19 Dr. Herbert Deaner Surgery note 03/21/19 Dr. Georgette Dover   INVASIVE DUCTAL CARCINOMA OF RIGHT BREAST, STAGE 1 (C50.911) Impression: RLOQ 5 mm 5 cmfn IDC, ER/PR  +, Her2 - 04/24/19 [facility] f/u Dr. Georgette Dover 03/28/19 right radioactive seed lumpectomy and sentinel ln bx invasive ductal ca  0.9 cm with some surrounding DCIS margins negative 2 nodes negative begin radiation next week with HT after radiation  F/u in 3 months On Tamoxifen as of 08/16/19   Provider: Dr. Olivia Mackie McLean-Scocuzza-Internal Medicine

## 2019-08-16 NOTE — Patient Instructions (Signed)
Wrist and Forearm Exercises Ask your health care provider which exercises are safe for you. Do exercises exactly as told by your health care provider and adjust them as directed. It is normal to feel mild stretching, pulling, tightness, or discomfort as you do these exercises. Stop right away if you feel sudden pain or your pain gets worse. Do not begin these exercises until told by your health care provider. Range-of-motion exercises These exercises warm up your muscles and joints and improve the movement and flexibility of your injured wrist and forearm. These exercises also help to relieve pain, numbness, and tingling. These exercises are done using the muscles in your injured wrist and forearm. Wrist flexion 1. Bend your left / right elbow to a 90-degree angle (right angle) with your palm facing the floor. 2. Bend your wrist so that your fingers point toward the floor (flexion). 3. Hold this position for __________ seconds. 4. Slowly return to the starting position. Repeat __________ times. Complete this exercise __________ times a day. Wrist extension 1. Bend your left / right elbow to a 90-degree angle (right angle) with your palm facing the floor. 2. Bend your wrist so that your fingers point toward the ceiling (extension). 3. Hold this position for __________ seconds. 4. Slowly return to the starting position. Repeat __________ times. Complete this exercise __________ times a day. Ulnar deviation 1. Bend your left / right elbow to a 90-degree angle (right angle), and rest your forearm on a table with your palm facing down. 2. Keeping your hand flat on the table, bend your left /right wrist toward your small finger (pinkie). This is ulnar deviation. 3. Hold this position for __________ seconds. 4. Slowly return to the starting position. Repeat __________ times. Complete this exercise __________ times a day. Radial deviation 1. Bend your left / right elbow to a 90-degree angle (right  angle), and rest your forearm on a table with your palm facing down. 2. Keeping your hand flat on the table, bend your left /right wrist toward your thumb. This is radial deviation. 3. Hold this position for __________ seconds. 4. Slowly return to the starting position. Repeat __________ times. Complete this exercise __________ times a day. Forearm rotation, supination  1. Sit with your left / right elbow bent to a 90-degree angle (right angle). Position your forearm so that the thumb is facing the ceiling (neutral position). 2. Turn (rotate) your palm up toward the ceiling (supination), stopping when you feel a gentle stretch. 3. Hold this position for __________ seconds. 4. Slowly return to the starting position. Repeat __________ times. Complete this exercise __________ times a day. Forearm rotation, pronation  1. Sit with your left / right elbow bent to a 90-degree angle (right angle). Position your forearm so that the thumb is facing the ceiling (neutral position). 2. Rotate your palm down toward the floor (pronation), stopping when you feel a gentle stretch. 3. Hold this position for __________ seconds. 4. Slowly return to the starting position. Repeat __________ times. Complete this exercise __________ times a day. Stretching These exercises warm up your muscles and joints and improve the movement and flexibility of your injured wrist and forearm. These exercises also help to relieve pain, numbness, and tingling. These exercises are done using your healthy wrist and forearm to help stretch the muscles in your injured wrist and forearm. Wrist flexion  1. Extend your left / right arm in front of you, and turn your palm down toward the floor. ? If told by   your health care provider, bend your left / right elbow to a 90-degree angle (right angle) at your side. 2. Using your uninjured hand, gently press over the back of your left / right hand to bend your wrist and fingers toward the floor  (flexion). Go as far as you can to feel a stretch without causing pain. 3. Hold this position for __________ seconds. 4. Slowly return to the starting position. Repeat __________ times. Complete this exercise __________ times a day. Wrist extension  1. Extend your left / right arm in front of you and turn your palm up toward the ceiling. ? If told by your health care provider, bend your left / right elbow to a 90-degree angle (right angle) at your side. 2. Using your uninjured hand, gently press over the palm of your left / right hand to bend your wrist and fingers toward the floor (extension). Go as far as you can to feel a stretch without causing pain. 3. Hold this position for __________ seconds. 4. Slowly return to the starting position. Repeat __________ times. Complete this exercise __________ times a day. Forearm rotation, supination 1. Sit with your left / right elbow bent to a 90-degree angle (right angle). Position your forearm so that the thumb is facing the ceiling (neutral position). 2. Rotate your palm up toward the ceiling as far as you can on your own (supination). Then, use your uninjured hand to help turn your forearm more, stopping when you feel a gentle stretch. 3. Hold this position for __________ seconds. 4. Slowly return to the starting position. Repeat __________ times. Complete this exercise __________ times a day. Forearm rotation, pronation 1. Sit with your left / right elbow bent to a 90-degree angle (right angle). Position your forearm so that the thumb is facing the ceiling (neutral position). 2. Rotate your palm down toward the floor as far as you can on your own (pronation). Then, use your uninjured hand to help turn your forearm more, stopping when you feel a gentle stretch. 3. Hold this position for __________ seconds. 4. Slowly return to the starting position. Repeat __________ times. Complete this exercise __________ times a day. Strengthening exercises  These exercises build strength and endurance in your wrist and forearm. Endurance is the ability to use your muscles for a long time, even after they get tired. Wrist flexion  1. Sit with your left / right forearm supported on a table or other surface. Bend your elbow to a 90-degree angle (right angle), and rest your hand palm-up over the edge of the table. 2. Hold a __________ weight in your left / right hand. Or, hold an exercise band or tube in both hands, keeping your hands at the same level and hip distance apart. There should be a slight tension in the exercise band or tube. 3. Slowly curl your hand up toward the ceiling (flexion). 4. Hold this position for __________ seconds. 5. Slowly lower your hand back to the starting position. Repeat __________ times. Complete this exercise __________ times a day. Wrist extension  1. Sit with your left / right forearm supported on a table or other surface. Bend your elbow to a 90-degree angle (right angle), and rest your hand palm-down over the edge of the table. 2. Hold a __________ weight in your left / right hand. Or, hold an exercise band or tube in both hands, keeping your hands at the same level and hip distance apart. There should be a slight tension in the exercise   band or tube. 3. Slowly curl your hand up toward the ceiling (extension). 4. Hold this position for __________ seconds. 5. Slowly lower your hand back to the starting position. Repeat __________ times. Complete this exercise __________ times a day. Forearm rotation, supination  1. Sit with your left / right forearm supported on a table or other surface. Bend your elbow to a 90-degree angle (right angle). Position your forearm so that your thumb is facing the ceiling (neutral position) and your hand is resting over the edge of the table. 2. Hold a hammer in your left / right hand. ? This exercise will be easier if you hold the hammer near the head of the hammer. ? This exercise  will be harder if you hold the hammer near the end of the handle. 3. Without moving your elbow, slowly rotate your palm up toward the ceiling (supination). 4. Hold this position for __________ seconds. 5. Slowly return to the starting position. Repeat __________ times. Complete this exercise __________ times a day. Forearm rotation, pronation  1. Sit with your left / right forearm supported on a table or other surface. Bend your elbow to a 90-degree angle (right angle). Position your forearm so that the thumb is facing the ceiling (neutral position), with your hand resting over the edge of the table. 2. Hold a hammer in your left / right hand. ? This exercise will be easier if you hold the hammer near the head of the hammer. ? This exercise will be harder if you hold the hammer near the end of the handle. 3. Without moving your elbow, slowly rotate your palm down toward the floor (pronation). 4. Hold this position for __________ seconds. 5. Slowly return to the starting position. Repeat __________ times. Complete this exercise __________ times a day. Grip strengthening  1. Grasp a stress ball or other ball in the middle of your left / right hand. Start with your elbow bent to a 90-degree angle (right angle). 2. Slowly increase the pressure, squeezing the ball as hard as you can without causing pain. ? Think of bringing the tips of your fingers into the middle of your palm. All of your finger joints should bend when doing this exercise. ? To make this exercise harder, gradually try to straighten your elbow in front of you, until you can do the exercise with your elbow fully straight. 3. Hold your squeeze for __________ seconds, then relax. If instructed by your health care provider, do this exercise: ? With your forearm positioned so that the thumb is facing the ceiling (neutral position). ? With your forearm turned palm down. ? With your forearm turned palm up. Repeat __________ times.  Complete this exercise __________ times a day. This information is not intended to replace advice given to you by your health care provider. Make sure you discuss any questions you have with your health care provider. Document Revised: 02/23/2018 Document Reviewed: 02/23/2018 Elsevier Patient Education  2020 [employer] Inc[employer]  

## 2019-08-16 NOTE — Progress Notes (Signed)
Patient presenting with right sided wrist pain, swelling, and stiffness. Right wrist is painful to touch and with movement. Pain 8/10.   Onset of this past Wednesday morning. Patient had an endoscopy and colonoscopy this past Tuesday. States she believes they may have had her in a position that compromised her wrist. No other know injuries, no falls.

## 2019-08-28 ENCOUNTER — Other Ambulatory Visit: Payer: Self-pay

## 2019-08-28 ENCOUNTER — Ambulatory Visit (INDEPENDENT_AMBULATORY_CARE_PROVIDER_SITE_OTHER): Payer: PPO | Admitting: Internal Medicine

## 2019-08-28 ENCOUNTER — Encounter: Payer: Self-pay | Admitting: Internal Medicine

## 2019-08-28 VITALS — BP 114/76 | HR 104 | Temp 98.1°F | Ht 68.0 in | Wt 206.1 lb

## 2019-08-28 DIAGNOSIS — M545 Low back pain, unspecified: Secondary | ICD-10-CM

## 2019-08-28 DIAGNOSIS — E119 Type 2 diabetes mellitus without complications: Secondary | ICD-10-CM

## 2019-08-28 DIAGNOSIS — G43709 Chronic migraine without aura, not intractable, without status migrainosus: Secondary | ICD-10-CM

## 2019-08-28 DIAGNOSIS — G43009 Migraine without aura, not intractable, without status migrainosus: Secondary | ICD-10-CM | POA: Diagnosis not present

## 2019-08-28 DIAGNOSIS — E669 Obesity, unspecified: Secondary | ICD-10-CM

## 2019-08-28 MED ORDER — CYCLOBENZAPRINE HCL 10 MG PO TABS: 10.0000 mg | ORAL_TABLET | Freq: Every day | ORAL | 1 refills | Status: DC

## 2019-08-28 MED ORDER — PROMETHAZINE HCL 25 MG PO TABS: 25.0000 mg | ORAL_TABLET | Freq: Two times a day (BID) | ORAL | 5 refills | Status: DC | PRN

## 2019-08-28 NOTE — Patient Instructions (Signed)
Debrox ear wax drops 5-10 drops x 1 week let ear stand up x 5-10 minutes 1x per week 1x per month

## 2019-08-28 NOTE — Progress Notes (Signed)
Chief Complaint  Patient presents with  . Follow-up  . Medication Refill    Flexeril   F/u  1. Right wrist pain and swelling improved with steroids and completed them 08/24/19 or just stopped due to giving h/a  2. Dm 2 A1C 6.9 declines meds for now 3. Low back pain needs refill flexeril  4. Chronic migraines needs phenergan prn refilled    Review of Systems  Constitutional: Positive for weight loss.  HENT: Negative for hearing loss.   Eyes: Negative for blurred vision.  Respiratory: Negative for shortness of breath.   Cardiovascular: Negative for chest pain.  Gastrointestinal: Negative for abdominal pain.  Musculoskeletal: Negative for joint pain.  Skin: Negative.  Negative for rash.  Neurological: Negative for headaches.  Psychiatric/Behavioral: Negative for depression.   Past Medical History:  Diagnosis Date  . Cancer ([place]) 02/2019   right breast IDC  . Cataracts, bilateral    tbd surgery 2021  . Complication of anesthesia   . Cough   . Family history of breast cancer   . Family history of lung cancer   . Family history of prostate cancer   . Family history of skin cancer   . GERD (gastroesophageal reflux disease)   . Headache   . High cholesterol   . Hypertension   . Hypothyroidism   . Irritable bowel   . Osteoarthritis    i.e right knee   . PONV (postoperative nausea and vomiting)   . Pre-diabetes   . Rheumatoid arthritis ([place])   . Thyroid disease    Past Surgical History:  Procedure Laterality Date  . ABDOMINAL HYSTERECTOMY     age 85 ovaries out  . BREAST LUMPECTOMY WITH RADIOACTIVE SEED AND SENTINEL LYMPH NODE BIOPSY Right 03/28/2019   Procedure: RIGHT BREAST LUMPECTOMY WITH RADIOACTIVE SEED AND SENTINEL LYMPH NODE BIOPSY;  Surgeon: Donnie Mesa, MD;  Location: [facility];  Service: General;  Laterality: Right;  PEC BLOCK FOR POST OP PAIN  . CATARACT EXTRACTION, BILATERAL    . ESOPHAGEAL DILATION     Dr. Olevia Perches 15 years from 2021  .  TONSILLECTOMY    . TUBAL LIGATION     Family History  Problem Relation Age of Onset  . Hypertension Mother   . Thyroid disease Mother   . Breast cancer Mother 78       breast dx age 23 died age 59   . Prostate cancer Father 55       metastatic  . Stroke Maternal Grandmother   . Skin cancer Maternal Uncle        dx. in his 15s  . Lung cancer Maternal Uncle        smoker, died age 86   Social History   Socioeconomic History  . Marital status: Married    Spouse name: Not on file  . Number of children: 2  . Years of education: Not on file  . Highest education level: Not on file  Occupational History  . Occupation: nurse  Tobacco Use  . Smoking status: Former Smoker    Packs/day: 0.25    Years: 7.00    Pack years: 1.75    Types: Cigarettes    Quit date: 2000    Years since quitting: 21.6  . Smokeless tobacco: Never Used  Vaping Use  . Vaping Use: Never used  Substance and Sexual Activity  . Alcohol use: Yes    Comment: socially   . Drug use: Never  . Sexual activity: Yes  Other Topics Concern  . 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 Difficulty of Paying Living Expenses:   Food Insecurity:   . Worried About Charity fundraiser in the Last Year:   . Arboriculturist in the Last Year:   Transportation Needs:   . Film/video editor (Medical):   Marland Kitchen Lack of Transportation (Non-Medical):   Physical Activity:   . Days of Exercise per Week:   . Minutes of Exercise per Session:   Stress:   . Feeling of Stress :   Social Connections:   . Frequency of Communication with Friends and Family:   . Frequency of Social Gatherings with Friends and Family:   . Attends Religious Services:   . Active Member of Clubs or Organizations:   . Attends Archivist Meetings:   Marland Kitchen Marital Status:   Intimate Partner Violence:   . Fear of Current or Ex-Partner:   .  Emotionally Abused:   Marland Kitchen Physically Abused:   . Sexually Abused:    Current Meds  Medication Sig  . azelastine (ASTELIN) 0.1 % nasal spray Place 1 spray into both nostrils 2 (two) times daily.  . betamethasone dipropionate (DIPROLENE) 0.05 % cream Apply topically 2 (two) times daily.  . Calcium Carbonate-Vitamin D (CALCIUM PLUS VITAMIN D PO) Take by mouth.  . cyclobenzaprine (FLEXERIL) 10 MG tablet Take 1 tablet (10 mg total) by mouth daily.  . Esomeprazole Magnesium (NEXIUM PO) Take by mouth.  Marland Kitchen glucosamine-chondroitin 500-400 MG tablet Take 1 tablet by mouth 3 (three) times daily.  . hydrocortisone cream 1 % Apply 1 application topically.  Marland Kitchen leflunomide (ARAVA) 20 MG tablet Take 1 tablet (20 mg total) by mouth daily.  Marland Kitchen levothyroxine (SYNTHROID) 100 MCG tablet Daily 30 minutes before food  . loratadine (CLARITIN) 10 MG tablet Take 10 mg by mouth daily.  Marland Kitchen olmesartan-hydrochlorothiazide (BENICAR HCT) 20-12.5 MG tablet Take 1 tablet by mouth daily. In am  . oxyCODONE-acetaminophen (PERCOCET) 5-325 MG tablet Take 1 tablet by mouth 2 (two) times daily as needed for severe pain.  . pravastatin (PRAVACHOL) 20 MG tablet Take 1 tablet (20 mg total) by mouth at bedtime.  . promethazine (PHENERGAN) 25 MG tablet Take 1 tablet (25 mg total) by mouth 2 (two) times daily as needed.  . RESTASIS 0.05 % ophthalmic emulsion daily.   . SUMAtriptan (IMITREX) 50 MG tablet TAKE 1 TABLET BY MOUTH AS NEEDED FOR HEADACHE  . tamoxifen (NOLVADEX) 20 MG tablet Take 1 tablet (20 mg total) by mouth daily.  Marland Kitchen topiramate (TOPAMAX) 100 MG tablet Take 1 tablet (100 mg total) by mouth daily.  . traMADol (ULTRAM) 50 MG tablet Take 2 tablets by mouth twice daily as needed for pain relief.  . [DISCONTINUED] cyclobenzaprine (FLEXERIL) 10 MG tablet Take 1 tablet (10 mg total) by mouth daily.  . [DISCONTINUED] promethazine (PHENERGAN) 25 MG tablet Take 1 tablet (25 mg total) by mouth every 6 (six) hours as needed.   Allergies   Allergen Reactions  . Demerol [Meperidine]   . Lisinopril Cough  . Meperidine Hcl     REACTION: vomiting  . Pentazocine Lactate     REACTION: hallucinations   Recent Results (from the past 2160 hour(s))  SARS CORONAVIRUS 2 (TAT 6-24 HRS) Nasopharyngeal Nasopharyngeal Swab     Status: None   Collection  Time: 07/02/19  1:09 PM   Specimen: Nasopharyngeal Swab  Result Value Ref Range   SARS Coronavirus 2 NEGATIVE NEGATIVE    Comment: (NOTE) SARS-CoV-2 target nucleic acids are NOT DETECTED.  The SARS-CoV-2 RNA is generally detectable in upper and lower respiratory specimens during the acute phase of infection. Negative results do not preclude SARS-CoV-2 infection, do not rule out co-infections with other pathogens, and should not be used as the sole basis for treatment or other patient management decisions. Negative results must be combined with clinical observations, patient history, and epidemiological information. The expected result is Negative.  Fact Sheet for Patients: SugarRoll.be  Fact Sheet for Healthcare Providers: https://www.woods-mathews.com/  This test is not yet approved or cleared by the [place] FDA and  has been authorized for detection and/or diagnosis of SARS-CoV-2 by FDA under an Emergency Use Authorization (EUA). This EUA will remain  in effect (meaning this test can be used) for the duration of the COVID-19 declaration under Se ction 564(b)(1) of the Act, 21 U.S.C. section 360bbb-3(b)(1), unless the authorization is terminated or revoked sooner.  Performed at [facility], [place] [address], [place], [place] [address]   HM DIABETES EYE EXAM     Status: None   Collection Time: 07/16/19 12:00 AM  Result Value Ref Range   HM Diabetic Eye Exam No Retinopathy No Retinopathy    Comment: 07/16/19 [facility] f/u in 3 months comp  DRUG MONITOR, TRAMADOL,QN, URINE     Status: Abnormal   Collection Time: 07/17/19   2:58 PM  Result Value Ref Range   Desmethyltramadol NEGATIVE <100 ng/mL   Tramadol 205 (H) <100 ng/mL   Tramadol Comments      Comment: See Tramadol Notes, LDT Notes  DRUG MONITOR, PANEL 5, W/CONF, URINE     Status: Abnormal   Collection Time: 07/17/19  2:58 PM  Result Value Ref Range   Amphetamines NEGATIVE <500 ng/mL   Barbiturates NEGATIVE <300 ng/mL   Benzodiazepines NEGATIVE <100 ng/mL   Cocaine Metabolite NEGATIVE <150 ng/mL   Marijuana Metabolite NEGATIVE <20 ng/mL   Methadone Metabolite NEGATIVE <100 ng/mL   Opiates POSITIVE (A) <100 ng/mL   Codeine NEGATIVE <50 ng/mL   Hydrocodone 216 (H) <50 ng/mL   Hydromorphone 65 (H) <50 ng/mL   Morphine NEGATIVE <50 ng/mL   Norhydrocodone 394 (H) <50 ng/mL   Opiates Comments      Comment: See Opiates Notes, LDT Notes   Oxycodone NEGATIVE <100 ng/mL   Creatinine 50.8 mg/dL   pH 6.5 4.5 - 9.0   Oxidant NEGATIVE mcg/mL  DM TEMPLATE     Status: None   Collection Time: 07/17/19  2:58 PM  Result Value Ref Range   Notes and Comments      Comment: This drug testing is for medical treatment only. Analysis was performed as non-forensic testing and these results should be used only by healthcare providers to render diagnosis or treatment, or to monitor progress of medical conditions. . Opiates Notes: Hydrocodone, Norhydrocodone, Hydromorphone detected is  consistent with the use of the drug Hydrocodone. . Hydromorphone detected is consistent with the use of  the drug Hydromorphone. . Hydromorphone can be a prescribed drug and is also a  metabolite of Hydrocodone. . Tramadol Notes: Tramadol detected is consistent with the use of the  drug Tramadol. . The metabolite Desmethyltramadol is not present at or  above the cutoff. . LDT Notes: Confirmation tests were developed and their analytical  performance characteristics have been  determined by  [facility]. It has not been cleared or approved  by the FDA. This assay has  been validated pursuant to  the CLIA regulations and is used for clinical purposes.  . . Healthcare Providers needing Interpretation assistance,  please contact us at [phone] ([phone])  M-F, 8am to 10pm EST    Objective  Body mass index is 31.34 kg/m. Wt Readings from Last 3 Encounters:  08/28/19 206 lb 1.9 oz (93.5 kg)  08/16/19 (!) 205 lb 12.8 oz (93.4 kg)  08/06/19 207 lb (93.9 kg)   Temp Readings from Last 3 Encounters:  08/28/19 98.1 F (36.7 C) (Oral)  08/16/19 98 F (36.7 C) (Oral)  08/06/19 97.7 F (36.5 C) (Temporal)   BP Readings from Last 3 Encounters:  08/28/19 114/76  08/16/19 124/76  08/06/19 130/78   Pulse Readings from Last 3 Encounters:  08/28/19 (!) 104  08/16/19 92  08/06/19 95    Physical Exam Vitals and nursing note reviewed.  Constitutional:      Appearance: Normal appearance. She is well-developed and well-groomed. She is obese.  HENT:     Head: Normocephalic and atraumatic.  Eyes:     Conjunctiva/sclera: Conjunctivae normal.     Pupils: Pupils are equal, round, and reactive to light.  Cardiovascular:     Rate and Rhythm: Normal rate and regular rhythm.     Heart sounds: Normal heart sounds. No murmur heard.   Pulmonary:     Effort: Pulmonary effort is normal.     Breath sounds: Normal breath sounds.  Skin:    General: Skin is warm and dry.  Neurological:     General: No focal deficit present.     Mental Status: She is alert and oriented to person, place, and time. Mental status is at baseline.     Gait: Gait normal.  Psychiatric:        Attention and Perception: Attention and perception normal.        Mood and Affect: Mood and affect normal.        Speech: Speech normal.        Behavior: Behavior normal. Behavior is cooperative.        Thought Content: Thought content normal.        Cognition and Memory: Cognition and memory normal.        Judgment: Judgment normal.     Assessment  Plan  Type 2 diabetes  mellitus without complication, without long-term current use of insulin (HCC) - Plan: Hemoglobin A1c  Low back pain, unspecified back pain laterality, unspecified chronicity, unspecified whether sciatica present - Plan: cyclobenzaprine (FLEXERIL) 10 MG tablet  Chronic migraine without aura without status migrainosus, not intractable - Plan: promethazine (PHENERGAN) 25 MG tablet  Migraine without aura and without status migrainosus, not intractable - Plan: promethazine (PHENERGAN) 25 MG tablet  Obesity (BMI 30-39.9)  rec healthy diet and exercise   HM Flu shotdeclines Tdap utd due in 2026  prevnargiven Rx today  pna 23disc today could consider in the future shingrixins wouldnt cover covid 19 vaccine 2/2 pfizer  Labsfasting 04/18/19 will [facility]ology Dr. Estanislado Pandy [facility] saw summer 2020 f/u yearly h/o [facility] chestappt scheduled as of 08/28/19 [facility] 2/4/21abnormal and repeat 03/09/19 right breast mass right breast rec biopsy did she get this  Paps/p total hysterectomy due to uterine prolapse h/o abnormal pap remotely ovaries removed Colonoscopywill refer Dr. Alice Reichert had and EGD 08/14/19 with bxs dexaget solis osteopenia 02/22/19 with mammo Former smoker quit 1 year ago  in 2019/2020 but ocass. Light smoking 2x per year socially Eyes doing after cataract surgery 02/13/19 Dr. Herbert Deaner Surgery note 03/21/19 Dr. Georgette Dover   INVASIVE DUCTAL CARCINOMA OF RIGHT BREAST, STAGE 1 (C50.911) Impression: RLOQ 5 mm 5 cmfn IDC, ER/PR +, Her2 - 04/24/19 [facility] f/u Dr. Georgette Dover 03/28/19 right radioactive seed lumpectomy and sentinel ln bx invasive ductal ca 0.9 cm with some surrounding DCIS margins negative 2 nodes negative begin radiation next week with HT after radiation  F/u in 3 months On Tamoxifen as of 08/16/19 appt Dr. Darnell Level lung 09/18/19 to consider CT chest   Provider: Dr. Olivia Mackie McLean-Scocuzza-Internal Medicine

## 2019-08-31 ENCOUNTER — Other Ambulatory Visit: Payer: Self-pay

## 2019-08-31 DIAGNOSIS — C50511 Malignant neoplasm of lower-outer quadrant of right female breast: Secondary | ICD-10-CM

## 2019-09-04 ENCOUNTER — Inpatient Hospital Stay: Payer: PPO

## 2019-09-04 ENCOUNTER — Encounter: Payer: Self-pay | Admitting: Nurse Practitioner

## 2019-09-04 ENCOUNTER — Inpatient Hospital Stay: Payer: PPO | Attending: Hematology | Admitting: Nurse Practitioner

## 2019-09-04 ENCOUNTER — Other Ambulatory Visit: Payer: Self-pay

## 2019-09-04 ENCOUNTER — Other Ambulatory Visit: Payer: Self-pay | Admitting: Internal Medicine

## 2019-09-04 VITALS — BP 142/71 | HR 95 | Temp 98.5°F | Resp 17 | Ht 68.0 in | Wt 205.7 lb

## 2019-09-04 DIAGNOSIS — Z8042 Family history of malignant neoplasm of prostate: Secondary | ICD-10-CM | POA: Diagnosis not present

## 2019-09-04 DIAGNOSIS — C50511 Malignant neoplasm of lower-outer quadrant of right female breast: Secondary | ICD-10-CM

## 2019-09-04 DIAGNOSIS — Z803 Family history of malignant neoplasm of breast: Secondary | ICD-10-CM | POA: Diagnosis not present

## 2019-09-04 DIAGNOSIS — E119 Type 2 diabetes mellitus without complications: Secondary | ICD-10-CM | POA: Diagnosis not present

## 2019-09-04 DIAGNOSIS — Z90722 Acquired absence of ovaries, bilateral: Secondary | ICD-10-CM | POA: Insufficient documentation

## 2019-09-04 DIAGNOSIS — M85852 Other specified disorders of bone density and structure, left thigh: Secondary | ICD-10-CM | POA: Insufficient documentation

## 2019-09-04 DIAGNOSIS — I10 Essential (primary) hypertension: Secondary | ICD-10-CM | POA: Diagnosis not present

## 2019-09-04 DIAGNOSIS — Z8349 Family history of other endocrine, nutritional and metabolic diseases: Secondary | ICD-10-CM | POA: Diagnosis not present

## 2019-09-04 DIAGNOSIS — Z90710 Acquired absence of both cervix and uterus: Secondary | ICD-10-CM | POA: Insufficient documentation

## 2019-09-04 DIAGNOSIS — Z8249 Family history of ischemic heart disease and other diseases of the circulatory system: Secondary | ICD-10-CM | POA: Insufficient documentation

## 2019-09-04 DIAGNOSIS — E039 Hypothyroidism, unspecified: Secondary | ICD-10-CM | POA: Diagnosis not present

## 2019-09-04 DIAGNOSIS — Z79899 Other long term (current) drug therapy: Secondary | ICD-10-CM | POA: Insufficient documentation

## 2019-09-04 DIAGNOSIS — Z923 Personal history of irradiation: Secondary | ICD-10-CM | POA: Diagnosis not present

## 2019-09-04 DIAGNOSIS — Z170 Estrogen receptor positive status [ER+]: Secondary | ICD-10-CM | POA: Diagnosis not present

## 2019-09-04 DIAGNOSIS — Z79810 Long term (current) use of selective estrogen receptor modulators (SERMs): Secondary | ICD-10-CM | POA: Diagnosis not present

## 2019-09-04 DIAGNOSIS — Z801 Family history of malignant neoplasm of trachea, bronchus and lung: Secondary | ICD-10-CM | POA: Insufficient documentation

## 2019-09-04 LAB — CMP (CANCER CENTER ONLY)
ALT: 12 U/L (ref 0–44)
AST: 18 U/L (ref 15–41)
Albumin: 3.6 g/dL (ref 3.5–5.0)
Alkaline Phosphatase: 42 U/L (ref 38–126)
Anion gap: 9 (ref 5–15)
BUN: 13 mg/dL (ref 8–23)
CO2: 26 mmol/L (ref 22–32)
Calcium: 10.5 mg/dL — ABNORMAL HIGH (ref 8.9–10.3)
Chloride: 107 mmol/L (ref 98–111)
Creatinine: 0.76 mg/dL (ref 0.44–1.00)
GFR, Est AFR Am: >60 mL/min (ref >60)
GFR, Estimated: >60 mL/min (ref >60)
Glucose, Bld: 122 mg/dL — ABNORMAL HIGH (ref 70–99)
Potassium: 3.4 mmol/L — ABNORMAL LOW (ref 3.5–5.1)
Sodium: 142 mmol/L (ref 135–145)
Total Bilirubin: 0.4 mg/dL (ref 0.3–1.2)
Total Protein: 7 g/dL (ref 6.5–8.1)

## 2019-09-04 LAB — CBC WITH DIFFERENTIAL (CANCER CENTER ONLY)
Abs Immature Granulocytes: 0.02 10*3/uL (ref 0.00–0.07)
Basophils Absolute: 0.1 10*3/uL (ref 0.0–0.1)
Basophils Relative: 2 %
Eosinophils Absolute: 0.2 10*3/uL (ref 0.0–0.5)
Eosinophils Relative: 5 %
HCT: 41.7 % (ref 36.0–46.0)
Hemoglobin: 13.8 g/dL (ref 12.0–15.0)
Immature Granulocytes: 0 %
Lymphocytes Relative: 44 %
Lymphs Abs: 2.1 10*3/uL (ref 0.7–4.0)
MCH: 30.6 pg (ref 26.0–34.0)
MCHC: 33.1 g/dL (ref 30.0–36.0)
MCV: 92.5 fL (ref 80.0–100.0)
Monocytes Absolute: 0.6 10*3/uL (ref 0.1–1.0)
Monocytes Relative: 13 %
Neutro Abs: 1.7 10*3/uL (ref 1.7–7.7)
Neutrophils Relative %: 36 %
Platelet Count: 258 10*3/uL (ref 150–400)
RBC: 4.51 MIL/uL (ref 3.87–5.11)
RDW: 13.2 % (ref 11.5–15.5)
WBC Count: 4.7 10*3/uL (ref 4.0–10.5)
nRBC: 0 % (ref 0.0–0.2)

## 2019-09-04 NOTE — Progress Notes (Signed)
CLINIC:  Survivorship   Patient Care Team: McLean-Scocuzza, Nino Glow, MD as PCP - General (Internal Medicine) Kyung Rudd, MD as Consulting Physician (Radiation Oncology) Truitt Merle, MD as Consulting Physician (Hematology) Donnie Mesa, MD as Consulting Physician (General Surgery) Mauro Kaufmann, RN as Oncology Nurse Navigator Rockwell Germany, RN as Oncology Nurse Navigator Alla Feeling, NP as Nurse Practitioner (Nurse Practitioner)   REASON FOR VISIT:  Routine follow-up post-treatment for a recent history of breast cancer.  BRIEF ONCOLOGIC HISTORY:  Oncology History Overview Note  Cancer Staging Malignant neoplasm of lower-outer quadrant of right breast of female, estrogen receptor positive ([place]) Staging form: Breast, AJCC 8th Edition - Clinical stage from 03/14/2019: Stage IA (cT1a, cN0, cM0, G1, ER+, PR+, HER2-) - Signed by Truitt Merle, MD on 03/20/2019 - Pathologic stage from 03/28/2019: Stage IA (pT1b, pN0(sn), cM0, G1, ER+, PR+, HER2-) - Unsigned    Malignant neoplasm of lower-outer quadrant of right breast of female, estrogen receptor positive ([place])  02/22/2019 Imaging   Bone Density Scan  02/22/19 DEXA shows osteopenia with lowest T-score -1.4 at left hip   03/09/2019 Mammogram   Diagnostic Mammogram 03/09/19 IMPRESSION the 67m mass in 7:00 posiiton of right breast suspicous of malignancy.     03/14/2019 Cancer Staging   Staging form: Breast, AJCC 8th Edition - Clinical stage from 03/14/2019: Stage IA (cT1a, cN0, cM0, G1, ER+, PR+, HER2-) - Signed by [name] MD on 03/20/2019   03/14/2019 Initial Biopsy   Diagnosis 03/14/19  Breast, right, needle core biopsy, 7 o'clock, 5cmfn - INVASIVE DUCTAL CARCINOMA. SEE NOTE Diagnosis Note Carcinoma measures 0.4 cm in greatest linear dimension and appears grade 1. Dr. CJeannie Donereviewed the case and concurs with the diagnosis. A breast prognostic profile (ER, PR, Ki-67 and HER2) is pending and will be reported in an addendum. Dr.  BIsaiah Blakeswas notified on 11/12/2019.   03/14/2019 Receptors her2   PROGNOSTIC INDICATORS Results: IMMUNOHISTOCHEMICAL AND MORPHOMETRIC ANALYSIS PERFORMED MANUALLY The tumor cells are EQUIVOCAL for Her2 (2+). Her2 by FISH will be performed and results reported separately. Estrogen Receptor: 95%, POSITIVE, STRONG STAINING INTENSITY Progesterone Receptor: 90%, POSITIVE, MODERATE STAINING INTENSITY Proliferation Marker Ki67: 10%   03/19/2019 Initial Diagnosis   Malignant neoplasm of lower-outer quadrant of right breast of [place], estrogen receptor positive ([place]   03/28/2019 Surgery   RIGHT BREAST LUMPECTOMY WITH RADIOACTIVE SEED AND SENTINEL LYMPH NODE BIOPSY by Dr TGeorgette Dover   03/28/2019 Pathology Results   FINAL MICROSCOPIC DIAGNOSIS:   A. BREAST, RIGHT, LUMPECTOMY:  - Invasive ductal carcinoma, 0.9 cm, Nottingham grade 1 of 3.  - Ductal carcinoma in situ, intermediate nuclear grade.  - Margins of resection are not involved.       - Invasive carcinoma, closest margin: 3 mm, superior.       - In situ carcinoma, closest margin: 3 mm, medial.  - Biopsy site.  - See oncology table.   B. SENTINEL LYMPH NODE, RIGHT AXILLARY #1, BIOPSY:  - One lymph node, negative for carcinoma (0/1).   C. SENTINEL LYMPH NODE, RIGHT AXILLARY #2, BIOPSY:  - One lymph node, negative for carcinoma (0/1).    03/28/2019 Cancer Staging   Staging form: Breast, AJCC 8th Edition - Pathologic stage from 03/28/2019: Stage IA (pT1b, pN0(sn), cM0, G1, ER+, PR+, HER2-) - Signed by [name] MD on 06/03/2019   04/26/2019 Genetic Testing   Negative genetic testing:  No pathogenic variants detected on the [facility] Common Hereditary Cancers panel. The report date is 04/26/2019.  The Common Hereditary Cancers Panel offered by [facility] includes sequencing and/or deletion duplication testing of the following 48 genes: APC, ATM, AXIN2, BARD1, BMPR1A, BRCA1, BRCA2, BRIP1, CDH1, CDK4, CDKN2A (p14ARF), CDKN2A (p16INK4a), CHEK2, CTNNA1,  DICER1, EPCAM (Deletion/duplication testing only), GREM1 (promoter region deletion/duplication testing only), KIT, MEN1, MLH1, MSH2, MSH3, MSH6, MUTYH, NBN, NF1, NHTL1, PALB2, PDGFRA, PMS2, POLD1, POLE, PTEN, RAD50, RAD51C, RAD51D, RNF43, SDHB, SDHC, SDHD, SMAD4, SMARCA4. STK11, TP53, TSC1, TSC2, and VHL.  The following genes were evaluated for sequence changes only: SDHA and HOXB13 c.251G>A variant only.    05/08/2019 - 06/04/2019 Radiation Therapy   Adjuvant Radiation with Dr Lisbeth Renshaw    06/2019 -  Anti-estrogen oral therapy   Tamoxifen starting in 06/2019    09/04/2019 Survivorship   SCP delivered by Cira Rue, NP      INTERVAL HISTORY:  [name] presents to the Survivorship Clinic today for our initial meeting to review her survivorship care plan detailing her treatment course for breast cancer, as well as monitoring long-term side effects of that treatment, education regarding health maintenance, screening, and overall wellness and health promotion.     [name] presents for survivorship visit as scheduled.  She feels "great," recovered well from radiation and tolerating tamoxifen which she started 05/2019.  She has some tingling in the hands and face but she started new RA injection around the same time.  This is helping, denies RA related pain.  She has minimal but tolerable hot flashes, good energy and appetite.  She has a cough and "asthma type symptoms" that is being worked up by pulmonology and PCP.  She had a recent endoscopy that showed hiatal hernia and upper GI inflammation and 1 precancerous polyp was removed during colonoscopy.  This was done at [facility] in 07/2019.  Denies concerns in her breast such as new lump/mass/nipple discharge or pain.  Denies change in her bowel habits, GI/GYN bleeding, fever, chills, chest pain.     ONCOLOGY TREATMENT TEAM:  1. Surgeon:  Dr. Georgette Dover at [facility] 2. Medical Oncologist: Dr. Burr Medico 3. Radiation Oncologist: Dr. Lisbeth Renshaw    PAST  MEDICAL/SURGICAL HISTORY:  Past Medical History:  Diagnosis Date  . Cancer ([place]) 02/2019   right breast IDC  . Cataracts, bilateral    tbd surgery 2021  . Complication of anesthesia   . Cough   . Family history of breast cancer   . Family history of lung cancer   . Family history of prostate cancer   . Family history of skin cancer   . GERD (gastroesophageal reflux disease)   . Headache   . High cholesterol   . Hypertension   . Hypothyroidism   . Irritable bowel   . Osteoarthritis    i.e right knee   . PONV (postoperative nausea and vomiting)   . Pre-diabetes   . Rheumatoid arthritis ([place])   . Thyroid disease    Past Surgical History:  Procedure Laterality Date  . ABDOMINAL HYSTERECTOMY     age 51 ovaries out  . BREAST LUMPECTOMY WITH RADIOACTIVE SEED AND SENTINEL LYMPH NODE BIOPSY Right 03/28/2019   Procedure: RIGHT BREAST LUMPECTOMY WITH RADIOACTIVE SEED AND SENTINEL LYMPH NODE BIOPSY;  Surgeon: Donnie Mesa, MD;  Location: [facility];  Service: General;  Laterality: Right;  PEC BLOCK FOR POST OP PAIN  . CATARACT EXTRACTION, BILATERAL    . ESOPHAGEAL DILATION     Dr. Olevia Perches 15 years from 2021  . TONSILLECTOMY    . TUBAL LIGATION  ALLERGIES:  Allergies  Allergen Reactions  . Demerol [Meperidine]   . Lisinopril Cough  . Meperidine Hcl     REACTION: vomiting  . Pentazocine Lactate     REACTION: hallucinations     CURRENT MEDICATIONS:  Outpatient Encounter Medications as of 09/04/2019  Medication Sig  . azelastine (ASTELIN) 0.1 % nasal spray Place 1 spray into both nostrils 2 (two) times daily.  . betamethasone dipropionate (DIPROLENE) 0.05 % cream Apply topically 2 (two) times daily.  . Calcium Carbonate-Vitamin D (CALCIUM PLUS VITAMIN D PO) Take by mouth.  . cyclobenzaprine (FLEXERIL) 10 MG tablet Take 1 tablet (10 mg total) by mouth daily.  . Esomeprazole Magnesium (NEXIUM PO) Take by mouth.  Marland Kitchen glucosamine-chondroitin 500-400 MG tablet  Take 1 tablet by mouth 3 (three) times daily.  . hydrocortisone cream 1 % Apply 1 application topically.  Marland Kitchen leflunomide (ARAVA) 20 MG tablet Take 1 tablet (20 mg total) by mouth daily.  Marland Kitchen levothyroxine (SYNTHROID) 100 MCG tablet Daily 30 minutes before food  . olmesartan-hydrochlorothiazide (BENICAR HCT) 20-12.5 MG tablet Take 1 tablet by mouth daily. In am  . oxyCODONE-acetaminophen (PERCOCET) 5-325 MG tablet Take 1 tablet by mouth 2 (two) times daily as needed for severe pain.  . pravastatin (PRAVACHOL) 20 MG tablet Take 1 tablet (20 mg total) by mouth at bedtime.  . promethazine (PHENERGAN) 25 MG tablet Take 1 tablet (25 mg total) by mouth 2 (two) times daily as needed.  . RESTASIS 0.05 % ophthalmic emulsion daily.   . SUMAtriptan (IMITREX) 50 MG tablet TAKE 1 TABLET BY MOUTH AS NEEDED FOR HEADACHE  . tamoxifen (NOLVADEX) 20 MG tablet Take 1 tablet (20 mg total) by mouth daily.  Marland Kitchen topiramate (TOPAMAX) 100 MG tablet Take 1 tablet (100 mg total) by mouth daily.  . traMADol (ULTRAM) 50 MG tablet Take 2 tablets by mouth twice daily as needed for pain relief.  . loratadine (CLARITIN) 10 MG tablet Take 10 mg by mouth daily.   No facility-administered encounter medications on file as of 09/04/2019.     ONCOLOGIC FAMILY HISTORY:  Family History  Problem Relation Age of Onset  . Hypertension Mother   . Thyroid disease Mother   . Breast cancer Mother 43       breast dx age 87 died age 82   . Prostate cancer Father 61       metastatic  . Stroke Maternal Grandmother   . Skin cancer Maternal Uncle        dx. in his 33s  . Lung cancer Maternal Uncle        smoker, died age 66     GENETIC COUNSELING/TESTING: Yes, negative 04/10/19   SOCIAL HISTORY:  Cheryl Knight is married and lives alone/with her spouse in [place].  She smoked cigarettes socially on and off for years, never a full pack ; quit in the last 2 years.  PHYSICAL EXAMINATION:  Vital Signs:   Vitals:   09/04/19  0927  BP: (!) 142/71  Pulse: 95  Resp: 17  Temp: 98.5 F (36.9 C)  SpO2: 98%   Filed Weights   09/04/19 0927  Weight: 205 lb 11.2 oz (93.3 kg)   General: Well-nourished, well-appearing female in no acute distress.   HEENT:  Sclerae anicteric Lymph: No cervical, supraclavicular, or infraclavicular lymphadenopathy  Cardiovascular: Regular rate and rhythm. Respiratory: Clear bilaterally. breathing non-labored.  Neuro: No focal deficits. Steady gait.  Psych: Mood and affect normal and appropriate for situation.  Extremities:  No edema. MSK: No focal spinal tenderness to palpation.  Full range of motion in bilateral upper extremities Skin: Warm and dry. Breast: S/p right lumpectomy and radiation, incisions completely healed.  Mild erythema and hyperpigmentation with skin thickening to the right breast.  No palpable mass in either breast or axilla that I could appreciate.  LABORATORY DATA:  CBC Latest Ref Rng & Units 09/04/2019 05/30/2019 04/18/2019  WBC 4.0 - 10.5 K/uL 4.7 5.1 6.7  Hemoglobin 12.0 - 15.0 g/dL 13.8 15.2 13.3  Hematocrit 36 - 46 % 41.7 46.9(H) 40.2  Platelets 150 - 400 K/uL 258 333 312.0   CMP Latest Ref Rng & Units 09/04/2019 05/30/2019 04/18/2019  Glucose 70 - 99 mg/dL 122(H) 114(H) 128(H)  BUN 8 - 23 mg/dL _0 Creatinine 0.44 - 1.00 mg/dL 0.76 0.72 0.75  Sodium 135 - 145 mmol/L 142 143 141  Potassium 3.5 - 5.1 mmol/L 3.4(L) 3.8 3.5  Chloride 98 - 111 mmol/L 107 103 104  CO2 22 - 32 mmol/L _1 Calcium 8.9 - 10.3 mg/dL 10.5(H) 10.4 10.0  Total Protein 6.5 - 8.1 g/dL 7.0 7.2 6.8  Total Bilirubin 0.3 - 1.2 mg/dL 0.4 0.4 0.2  Alkaline Phos 38 - 126 U/L 42 - 54  AST 15 - 41 U/L _2 ALT 0 - 44 U/L _3 DIAGNOSTIC IMAGING:  None for this visit.      ASSESSMENT AND PLAN:  Ms.. Pegues is a pleasant 63 y.o. female with Stage 1A right breast invasive ductal carcinoma, ER+/PR+/HER2-, diagnosed in 03/2019 treated with lumpectomy, adjuvant  radiation therapy, and anti-estrogen therapy with tamoxifen beginning in 05/2019.  She presents to the Survivorship Clinic for our initial meeting and routine follow-up post-completion of treatment for breast cancer.    1. Stage 1A right breast cancer:  Ms. Sergent has recovered well from definitive treatment for breast cancer.  Breast exam is benign, no clinical concern for recurrence.  She will follow-up with her medical oncologist, Dr. Burr Medico in 02/2020 with history and physical exam per surveillance protocol.  She will continue her anti-estrogen therapy with tamoxifen. Thus far, she is tolerating well, with minimal side effects. She was instructed to make Dr. Burr Medico or myself aware if she begins to experience any worsening side effects of the medication and I could see her back in clinic to help manage those side effects, as needed. Today, a comprehensive survivorship care plan and treatment summary was reviewed with the patient today detailing her breast cancer diagnosis, treatment course, potential late/long-term effects of treatment, appropriate follow-up care with recommendations for the future, and patient education resources.  A copy of this summary, along with a letter will be sent to the patient's primary care provider via In Basket message after today's visit.    2. Bone health:  Given Ms. Nephew's history of osteopenia, we chose tamoxifen as her preferred antiestrogen medication.  Her last DEXA scan was 02/2019, which showed osteopenia.  She takes 2 calcium with vitamin D daily, she has mild hypercalcemia at 10.5 today.  I recommend to separate calcium with vitamin D, take calcium only once daily and take 2 vitamin D. She was also encouraged to increase her weight-bearing activities.  She was given education on specific activities to promote bone health.  3. Cancer screening:  Due to Ms. Diem's history and her age, she should receive screening for skin cancers, colon cancer, and gynecologic  cancers. She follows up with  derm. She is up to date with upper GI and colonoscopy at [facility] on 08/14/19. She is s/p hysterectomy.  The information and recommendations are listed on the patient's comprehensive care plan/treatment summary and were reviewed in detail with the patient.   4. Health maintenance and wellness promotion: Ms. Dockendorf was encouraged to consume 5-7 servings of fruits and vegetables per day. We reviewed the "Nutrition Rainbow" handout, as well as the handout "Take Control of Your Health and Reduce Your Cancer Risk" from the [employer].  She was also encouraged to engage in moderate to vigorous exercise for 30 minutes per day most days of the week. We discussed the [employer] fitness program, which is designed for cancer survivors to help them become more physically fit after cancer treatments.  She was instructed to limit her alcohol consumption and continue to abstain from tobacco use.   5. Support services/counseling: It is not uncommon for this period of the patient's cancer care trajectory to be one of many emotions and stressors.  We discussed an opportunity for her to participate in the next session of [facility] ("Finding Your New Normal") support group series designed for patients after they have completed treatment.   Ms. Kochan was encouraged to take advantage of our many other support services programs, support groups, and/or counseling in coping with her new life as a cancer survivor after completing anti-cancer treatment.  She was offered support today through active listening and expressive supportive counseling.  She was given information regarding our available services and encouraged to contact me with any questions or for help enrolling in any of our support group/programs.    Dispo:   -Return to cancer center 02/2020 -Mammogram due in 02/2020 -Follow up with surgery 11/2019  -She is welcome to return back to the Survivorship Clinic at any time; no  additional follow-up needed at this time.  -Consider referral back to survivorship as a long-term survivor for continued surveillance  A total of (30) minutes of face-to-face time was spent with this patient with greater than 50% of that time in counseling and care-coordination.   Cira Rue, NP Survivorship Program [facility] [phone]   Note: PRIMARY CARE PROVIDER [place], Nino Glow, [place] [phone]

## 2019-09-06 ENCOUNTER — Telehealth: Payer: Self-pay | Admitting: Nurse Practitioner

## 2019-09-06 LAB — HGB A1C W/O EAG: Hgb A1c MFr Bld: 6.9 % — ABNORMAL HIGH (ref 4.8–5.6)

## 2019-09-06 NOTE — Telephone Encounter (Signed)
No 8/17 los

## 2019-09-18 ENCOUNTER — Encounter: Payer: Self-pay | Admitting: Primary Care

## 2019-09-18 ENCOUNTER — Other Ambulatory Visit: Payer: Self-pay

## 2019-09-18 ENCOUNTER — Ambulatory Visit: Payer: PPO | Admitting: Primary Care

## 2019-09-18 DIAGNOSIS — R05 Cough: Secondary | ICD-10-CM

## 2019-09-18 DIAGNOSIS — R059 Cough, unspecified: Secondary | ICD-10-CM

## 2019-09-18 DIAGNOSIS — R053 Chronic cough: Secondary | ICD-10-CM

## 2019-09-18 NOTE — Assessment & Plan Note (Signed)
-   Patient has had worsening cough x 1 year. Spirometry and diffusion capacity normal with no bronchodilator response. She has seen no improvement on ICS/LABA inhaler. Cough and upper airway wheezing likely d.t underlying reflux and possibly due to hiatal hernia. We will change Nexium to Dexilant 30mg  once daily with option to increase dose to 60mg  if needed. Encourage patient to follow strict GERD diet and wean down on coffee if possible. She does have hx of RA so we will get a high resolution CT to rule out interstitial lung disease.   Plan: Start Dexilant 30mg  once daily in morning (stop Nexium) Continue Pepcid at bedtime  Continue Astelin nasal spray for PND  Stay off fish oil Ok to stop Symbicort  Checking High resolution CT to rule out RA-ILD

## 2019-09-18 NOTE — Patient Instructions (Addendum)
Recommendations: Follow GERD diet, cut out coffee if possible Start Dexilant 30mg  once daily in morning (stop Nexium) Continue Pepcid at bedtime  Continue Astelin nasal spray  Stay off fish oil Ok to stop Symbicort  Checking High resolution CT to rule out RA-ILD   Orders:  HRCT re: persistent cough   Follow-up: 3 months with Dr. Patsey Berthold    Food Choices for Gastroesophageal Reflux Disease, Adult When you have gastroesophageal reflux disease (GERD), the foods you eat and your eating habits are very important. Choosing the right foods can help ease your discomfort. Think about working with a nutrition specialist (dietitian) to help you make good choices. What are tips for following this plan?  Meals  Choose healthy foods that are low in fat, such as fruits, vegetables, whole grains, low-fat dairy products, and lean meat, fish, and poultry.  Eat small meals often instead of 3 large meals a day. Eat your meals slowly, and in a place where you are relaxed. Avoid bending over or lying down until 2-3 hours after eating.  Avoid eating meals 2-3 hours before bed.  Avoid drinking a lot of liquid with meals.  Cook foods using methods other than frying. Bake, grill, or broil food instead.  Avoid or limit: ? Chocolate. ? Peppermint or spearmint. ? Alcohol. ? Pepper. ? Black and decaffeinated coffee. ? Black and decaffeinated tea. ? Bubbly (carbonated) soft drinks. ? Caffeinated energy drinks and soft drinks.  Limit high-fat foods such as: ? Fatty meat or fried foods. ? Whole milk, cream, butter, or ice cream. ? Nuts and nut butters. ? Pastries, donuts, and sweets made with butter or shortening.  Avoid foods that cause symptoms. These foods may be different for everyone. Common foods that cause symptoms include: ? Tomatoes. ? Oranges, lemons, and limes. ? Peppers. ? Spicy food. ? Onions and garlic. ? Vinegar. Lifestyle  Maintain a healthy weight. Ask your doctor what  weight is healthy for you. If you need to lose weight, work with your doctor to do so safely.  Exercise for at least 30 minutes for 5 or more days each week, or as told by your doctor.  Wear loose-fitting clothes.  Do not smoke. If you need help quitting, ask your doctor.  Sleep with the head of your bed higher than your feet. Use a wedge under the mattress or blocks under the bed frame to raise the head of the bed. Summary  When you have gastroesophageal reflux disease (GERD), food and lifestyle choices are very important in easing your symptoms.  Eat small meals often instead of 3 large meals a day. Eat your meals slowly, and in a place where you are relaxed.  Limit high-fat foods such as fatty meat or fried foods.  Avoid bending over or lying down until 2-3 hours after eating.  Avoid peppermint and spearmint, caffeine, alcohol, and chocolate. This information is not intended to replace advice given to you by your health care provider. Make sure you discuss any questions you have with your health care provider. Document Revised: 04/27/2018 Document Reviewed: 02/10/2016 Elsevier Patient Education  [employer].    Hiatal Hernia  A hiatal hernia occurs when part of the stomach slides above the muscle that separates the abdomen from the chest (diaphragm). A person can be born with a hiatal hernia (congenital), or it may develop over time. In almost all cases of hiatal hernia, only the top part of the stomach pushes through the diaphragm. Many people have a hiatal  hernia with no symptoms. The larger the hernia, the more likely it is that you will have symptoms. In some cases, a hiatal hernia allows stomach acid to flow back into the tube that carries food from your mouth to your stomach (esophagus). This may cause heartburn symptoms. Severe heartburn symptoms may mean that you have developed a condition called gastroesophageal reflux disease (GERD). What are the causes? This  condition is caused by a weakness in the opening (hiatus) where the esophagus passes through the diaphragm to attach to the upper part of the stomach. A person may be born with a weakness in the hiatus, or a weakness can develop over time. What increases the risk? This condition is more likely to develop in:  Older people. Age is a major risk factor for a hiatal hernia, especially if you are over the age of 32.  Pregnant women.  People who are overweight.  People who have frequent constipation. What are the signs or symptoms? Symptoms of this condition usually develop in the form of GERD symptoms. Symptoms include:  Heartburn.  Belching.  Indigestion.  Trouble swallowing.  Coughing or wheezing.  Sore throat.  Hoarseness.  Chest pain.  Nausea and vomiting. How is this diagnosed? This condition may be diagnosed during testing for GERD. Tests that may be done include:  X-rays of your stomach or chest.  An upper gastrointestinal (GI) series. This is an X-ray exam of your GI tract that is taken after you swallow a chalky liquid that shows up clearly on the X-ray.  Endoscopy. This is a procedure to look into your stomach using a thin, flexible tube that has a tiny camera and light on the end of it. How is this treated? This condition may be treated by:  Dietary and lifestyle changes to help reduce GERD symptoms.  Medicines. These may include: ? Over-the-counter antacids. ? Medicines that make your stomach empty more quickly. ? Medicines that block the production of stomach acid (H2 blockers). ? Stronger medicines to reduce stomach acid (proton pump inhibitors).  Surgery to repair the hernia, if other treatments are not helping. If you have no symptoms, you may not need treatment. Follow these instructions at home: Lifestyle and activity  Do not use any products that contain nicotine or tobacco, such as cigarettes and e-cigarettes. If you need help quitting, ask your  health care provider.  Try to achieve and maintain a healthy body weight.  Avoid putting pressure on your abdomen. Anything that puts pressure on your abdomen increases the amount of acid that may be pushed up into your esophagus. ? Avoid bending over, especially after eating. ? Raise the head of your bed by putting blocks under the legs. This keeps your head and esophagus higher than your stomach. ? Do not wear tight clothing around your chest or stomach. ? Try not to strain when having a bowel movement, when urinating, or when lifting heavy objects. Eating and drinking  Avoid foods that can worsen GERD symptoms. These may include: ? Fatty foods, like fried foods. ? Citrus fruits, like oranges or lemon. ? Other foods and drinks that contain acid, like orange juice or tomatoes. ? Spicy food. ? Chocolate.  Eat frequent small meals instead of three large meals a day. This helps prevent your stomach from getting too full. ? Eat slowly. ? Do not lie down right after eating. ? Do not eat 1-2 hours before bed.  Do not drink beverages with caffeine. These include cola, coffee, cocoa, and  tea.  Do not drink alcohol. General instructions  Take over-the-counter and prescription medicines only as told by your health care provider.  Keep all follow-up visits as told by your health care provider. This is important. Contact a health care provider if:  Your symptoms are not controlled with medicines or lifestyle changes.  You are having trouble swallowing.  You have coughing or wheezing that will not go away. Get help right away if:  Your pain is getting worse.  Your pain spreads to your arms, neck, jaw, teeth, or back.  You have shortness of breath.  You sweat for no reason.  You feel sick to your stomach (nauseous) or you vomit.  You vomit blood.  You have bright red blood in your stools.  You have black, tarry stools. This information is not intended to replace advice given  to you by your health care provider. Make sure you discuss any questions you have with your health care provider. Document Revised: 12/17/2016 Document Reviewed: 08/09/2016 Elsevier Patient Education  [place].

## 2019-09-18 NOTE — Progress Notes (Signed)
@Patient  ID: Cheryl Knight, female    DOB: June 23, 1955, 64 y.o.   MRN: [id number]  Chief Complaint  Patient presents with  . Follow-up    Cough seems worse since the last visit and she has noticed some wheezing- symptoms start around 2 pm. Cough is prod at times with thick, clear sputum. She only uses the symbicort prn maybe once per wk.     Referring provider: McLean-Scocuzza, Olivia Mackie *  HPI: 64 year old female, former smoker quit in 2000 (1.75-pack-year history).  Past medical history significant for moderate asthma, allergic rhinitis, hypertension, GERD, esophagitis, hypothyroidism, type 2 diabetes, rheumatoid arthritis, psoriasis, osteoarthritis, malignant neoplasm right breast cancer .  Patient of Dr. Patsey Berthold, last seen on 08/06/2019 for chronic cough.  Initially bronchodilators seemed somewhat helpful however cough recently aggravated again.  In the interim patient has been started on tamoxifen which has a side effect of cough.  Pulmonary function testing appears normal.  She has associated significant postnasal drip but does not tolerate nasal corticosteroids.  Okay to discontinue Symbicort see how she does off medication.  Given trial of azelastine nasal spray.  Planning for EGD to evaluate reflux.  Recommended to stop fish oil supplements as this can aggravate cough.  09/18/2019 Patient presents today for 4 week follow-up for cough. She has had cough for over a year which has progressively worsened. Her cough is worse in the afternoon. She has two cups of coffee at breakfast. During last visit patient was started on Astelin nasal spray, advised to stop fish oil and ok to come off Symbicort. Endoscopy showed inflammation in stomach and esophagus and hiatal hernia. No barrette's esophagus or H.pylori. She has been on Tomoxifen since June 2021.   Allergies  Allergen Reactions  . Demerol [Meperidine]   . Lisinopril Cough  . Meperidine Hcl     REACTION: vomiting  . Pentazocine Lactate       REACTION: hallucinations    Immunization History  Administered Date(s) Administered  . H1N1 02/27/2008  . Influenza-Unspecified 11/18/2017  . PFIZER SARS-COV-2 Vaccination 03/02/2019, 03/29/2019  . Td 01/19/2004  . Tdap 12/17/2014    Past Medical History:  Diagnosis Date  . Cancer ([place]) 02/2019   right breast IDC  . Cataracts, bilateral    tbd surgery 2021  . Complication of anesthesia   . Cough   . Family history of breast cancer   . Family history of lung cancer   . Family history of prostate cancer   . Family history of skin cancer   . GERD (gastroesophageal reflux disease)   . Headache   . High cholesterol   . Hypertension   . Hypothyroidism   . Irritable bowel   . Osteoarthritis    i.e right knee   . PONV (postoperative nausea and vomiting)   . Pre-diabetes   . Rheumatoid arthritis ([place])   . Thyroid disease     Tobacco History: Social History   Tobacco Use  Smoking Status Former Smoker  . Packs/day: 0.25  . Years: 7.00  . Pack years: 1.75  . Types: Cigarettes  . Quit date: 2000  . Years since quitting: 21.6  Smokeless Tobacco Never Used   Counseling given: Not Answered   Outpatient Medications Prior to Visit  Medication Sig Dispense Refill  . azelastine (ASTELIN) 0.1 % nasal spray Place 1 spray into both nostrils 2 (two) times daily. 30 mL 12  . betamethasone dipropionate (DIPROLENE) 0.05 % cream Apply topically 2 (two) times daily. 45 g 3  .  budesonide-formoterol (SYMBICORT) 160-4.5 MCG/ACT inhaler Inhale 2 puffs into the lungs 2 (two) times daily as needed.    . Calcium Carbonate-Vitamin D (CALCIUM PLUS VITAMIN D PO) Take by mouth.    . cyclobenzaprine (FLEXERIL) 10 MG tablet Take 1 tablet (10 mg total) by mouth daily. 90 tablet 1  . glucosamine-chondroitin 500-400 MG tablet Take 1 tablet by mouth 3 (three) times daily.    Marland Kitchen HYDROcodone-acetaminophen (NORCO/VICODIN) 5-325 MG tablet Take 1 tablet by mouth every 6 (six) hours as needed for  moderate pain.    . hydrocortisone cream 1 % Apply 1 application topically.    Marland Kitchen leflunomide (ARAVA) 20 MG tablet Take 1 tablet (20 mg total) by mouth daily. 30 tablet 2  . levothyroxine (SYNTHROID) 100 MCG tablet Daily 30 minutes before food 90 tablet 3  . loratadine (CLARITIN) 10 MG tablet Take 10 mg by mouth daily.    Marland Kitchen olmesartan-hydrochlorothiazide (BENICAR HCT) 20-12.5 MG tablet Take 1 tablet by mouth daily. In am 90 tablet 3  . pravastatin (PRAVACHOL) 20 MG tablet Take 1 tablet (20 mg total) by mouth at bedtime. 90 tablet 3  . promethazine (PHENERGAN) 25 MG tablet Take 1 tablet (25 mg total) by mouth 2 (two) times daily as needed. 30 tablet 5  . RESTASIS 0.05 % ophthalmic emulsion daily.     . SUMAtriptan (IMITREX) 50 MG tablet TAKE 1 TABLET BY MOUTH AS NEEDED FOR HEADACHE 9 tablet 11  . tamoxifen (NOLVADEX) 20 MG tablet Take 1 tablet (20 mg total) by mouth daily. 30 tablet 5  . topiramate (TOPAMAX) 100 MG tablet Take 1 tablet (100 mg total) by mouth daily. 90 tablet 3  . traMADol (ULTRAM) 50 MG tablet Take 2 tablets by mouth twice daily as needed for pain relief. 120 tablet 0  . Esomeprazole Magnesium (NEXIUM PO) Take by mouth.    . oxyCODONE-acetaminophen (PERCOCET) 5-325 MG tablet Take 1 tablet by mouth 2 (two) times daily as needed for severe pain. 10 tablet 0   No facility-administered medications prior to visit.    Review of Systems  Review of Systems  Constitutional: Negative.   Respiratory: Positive for cough and wheezing. Negative for shortness of breath.     Physical Exam  BP 126/86 (BP Location: Left Arm, Cuff Size: Normal)   Pulse 93   Temp 97.7 F (36.5 C) (Temporal)   Ht 5\' 8"  (1.727 m)   Wt 203 lb 12.8 oz (92.4 kg)   SpO2 95% Comment: on RA  BMI 30.99 kg/m  Physical Exam Constitutional:      Appearance: Normal appearance.  HENT:     Head: Normocephalic and atraumatic.     Mouth/Throat:     Comments: Deferred d/t masking Cardiovascular:     Rate and  Rhythm: Normal rate and regular rhythm.  Pulmonary:     Effort: Pulmonary effort is normal.     Breath sounds: Normal breath sounds. No rhonchi.     Comments: Upper airway wheeze Skin:    General: Skin is warm and dry.  Neurological:     General: No focal deficit present.     Mental Status: She is alert and oriented to person, place, and time. Mental status is at baseline.  Psychiatric:        Mood and Affect: Mood normal.        Behavior: Behavior normal.        Thought Content: Thought content normal.        Judgment: Judgment normal.  Lab Results:  CBC    Component Value Date/Time   WBC 4.7 09/04/2019 0906   WBC 5.1 05/30/2019 1126   RBC 4.51 09/04/2019 0906   HGB 13.8 09/04/2019 0906   HGB 14.3 12/21/2018 1014   HCT 41.7 09/04/2019 0906   HCT 42.3 12/21/2018 1014   PLT 258 09/04/2019 0906   PLT 325 12/21/2018 1014   MCV 92.5 09/04/2019 0906   MCV 92 12/21/2018 1014   MCH 30.6 09/04/2019 0906   MCHC 33.1 09/04/2019 0906   RDW 13.2 09/04/2019 0906   RDW 13.2 12/21/2018 1014   LYMPHSABS 2.1 09/04/2019 0906   LYMPHSABS 3.4 (H) 12/21/2018 1014   MONOABS 0.6 09/04/2019 0906   EOSABS 0.2 09/04/2019 0906   EOSABS 0.2 12/21/2018 1014   BASOSABS 0.1 09/04/2019 0906   BASOSABS 0.1 12/21/2018 1014    BMET    Component Value Date/Time   NA 142 09/04/2019 0906   NA 144 12/21/2018 1014   K 3.4 (L) 09/04/2019 0906   CL 107 09/04/2019 0906   CO2 26 09/04/2019 0906   GLUCOSE 122 (H) 09/04/2019 0906   GLUCOSE 87 01/20/2006 1243   BUN 13 09/04/2019 0906   BUN 17 12/21/2018 1014   CREATININE 0.76 09/04/2019 0906   CREATININE 0.72 05/30/2019 1126   CALCIUM 10.5 (H) 09/04/2019 0906   GFRNONAA >60 09/04/2019 0906   GFRNONAA 88 05/30/2019 1126   GFRAA >60 09/04/2019 0906   GFRAA 102 05/30/2019 1126    BNP No results found for: BNP  ProBNP No results found for: PROBNP  Imaging: No results found.   Assessment & Plan:   Persistent cough - Patient has had  worsening cough x 1 year. Spirometry and diffusion capacity normal with no bronchodilator response. She has seen no improvement on ICS/LABA inhaler. Cough and upper airway wheezing likely d.t underlying reflux and possibly due to hiatal hernia. We will change Nexium to Dexilant 30mg  once daily with option to increase dose to 60mg  if needed. Encourage patient to follow strict GERD diet and wean down on coffee if possible. She does have hx of RA so we will get a high resolution CT to rule out interstitial lung disease.   Plan: Start Dexilant 30mg  once daily in morning (stop Nexium) Continue Pepcid at bedtime  Continue Astelin nasal spray for PND  Stay off fish oil Ok to stop Symbicort  Checking High resolution CT to rule out RA-ILD     Martyn Ehrich, NP 09/18/2019

## 2019-09-20 MED ORDER — DEXILANT 30 MG PO CPDR: 30.0000 mg | DELAYED_RELEASE_CAPSULE | Freq: Every day | ORAL | 3 refills | Status: DC

## 2019-09-23 ENCOUNTER — Encounter: Payer: Self-pay | Admitting: Internal Medicine

## 2019-09-26 ENCOUNTER — Ambulatory Visit
Admission: RE | Admit: 2019-09-26 | Discharge: 2019-09-26 | Disposition: A | Discharge status: Home / Self Care | Payer: PPO | Source: Ambulatory Visit | Attending: Primary Care | Admitting: Primary Care

## 2019-09-26 ENCOUNTER — Other Ambulatory Visit: Payer: Self-pay

## 2019-09-26 ENCOUNTER — Other Ambulatory Visit: Payer: Self-pay | Admitting: Rheumatology

## 2019-09-26 DIAGNOSIS — M0579 Rheumatoid arthritis with rheumatoid factor of multiple sites without organ or systems involvement: Secondary | ICD-10-CM

## 2019-09-26 DIAGNOSIS — J84112 Idiopathic pulmonary fibrosis: Secondary | ICD-10-CM | POA: Diagnosis not present

## 2019-09-26 DIAGNOSIS — I700 Atherosclerosis of aorta: Secondary | ICD-10-CM | POA: Diagnosis not present

## 2019-09-26 DIAGNOSIS — R05 Cough: Secondary | ICD-10-CM | POA: Diagnosis not present

## 2019-09-26 DIAGNOSIS — R918 Other nonspecific abnormal finding of lung field: Secondary | ICD-10-CM | POA: Diagnosis not present

## 2019-09-26 DIAGNOSIS — R059 Cough, unspecified: Secondary | ICD-10-CM

## 2019-09-26 DIAGNOSIS — J479 Bronchiectasis, uncomplicated: Secondary | ICD-10-CM | POA: Diagnosis not present

## 2019-09-26 NOTE — Telephone Encounter (Signed)
Last Visit: 07/17/2019 Next Visit: 12/18/2019 Labs: 09/04/2019 Potassium 3.4, Glucose 122, Calcium 10.5   Current Dose per office note on 07/17/2019: Arava 20 mg p.o. daily. Dx: Rheumatoid arthritis involving multiple sites with positive rheumatoid factor   Okay to refill Arava?

## 2019-09-28 ENCOUNTER — Encounter: Payer: Self-pay | Admitting: Internal Medicine

## 2019-09-28 ENCOUNTER — Telehealth (INDEPENDENT_AMBULATORY_CARE_PROVIDER_SITE_OTHER): Payer: PPO | Admitting: Internal Medicine

## 2019-09-28 ENCOUNTER — Other Ambulatory Visit: Payer: Self-pay

## 2019-09-28 VITALS — Ht 68.0 in | Wt 203.8 lb

## 2019-09-28 DIAGNOSIS — J479 Bronchiectasis, uncomplicated: Secondary | ICD-10-CM | POA: Diagnosis not present

## 2019-09-28 DIAGNOSIS — E1165 Type 2 diabetes mellitus with hyperglycemia: Secondary | ICD-10-CM

## 2019-09-28 DIAGNOSIS — I10 Essential (primary) hypertension: Secondary | ICD-10-CM | POA: Diagnosis not present

## 2019-09-28 DIAGNOSIS — R918 Other nonspecific abnormal finding of lung field: Secondary | ICD-10-CM

## 2019-09-28 DIAGNOSIS — I700 Atherosclerosis of aorta: Secondary | ICD-10-CM | POA: Diagnosis not present

## 2019-09-28 DIAGNOSIS — I152 Hypertension secondary to endocrine disorders: Secondary | ICD-10-CM

## 2019-09-28 DIAGNOSIS — J8410 Pulmonary fibrosis, unspecified: Secondary | ICD-10-CM | POA: Diagnosis not present

## 2019-09-28 DIAGNOSIS — R053 Chronic cough: Secondary | ICD-10-CM

## 2019-09-28 DIAGNOSIS — R202 Paresthesia of skin: Secondary | ICD-10-CM | POA: Diagnosis not present

## 2019-09-28 DIAGNOSIS — R05 Cough: Secondary | ICD-10-CM

## 2019-09-28 DIAGNOSIS — E1159 Type 2 diabetes mellitus with other circulatory complications: Secondary | ICD-10-CM

## 2019-09-28 MED ORDER — SITAGLIPTIN PHOSPHATE 25 MG PO TABS: 25.0000 mg | ORAL_TABLET | Freq: Every day | ORAL | 3 refills | Status: DC

## 2019-09-28 NOTE — Progress Notes (Signed)
Telephone Note  I connected with Cheryl Knight  on 09/28/19 at  4:25 PM EDT by telephone and verified that I am speaking with the correct person using two identifiers.  Location patient: home Location provider:work or home office Persons participating in the virtual visit: patient, provider  I discussed the limitations of evaluation and management by telemedicine and the availability of in person appointments. The patient expressed understanding and agreed to proceed.   HPI: 1. Review of CT scan bronchiectasis, mild pulm fibrosis with h/o RA DDx NSIP/UIP she had this 09/26/19 but has not heard results from pulm yet. Also with pulm nodules noted on CT chest  2. Due to # 1 She still has coughing and wheezing and with cough reports b/l arms tingling worse and night in arms and hands and sides of face b/l worse x 1 week  Nothing tried   3. DM 2 A1C 6.9 agreeable to Januvia 25  ROS: See pertinent positives and negatives per HPI.  Past Medical History:  Diagnosis Date  . Cancer ([place]) 02/2019   right breast IDC  . Cataracts, bilateral    tbd surgery 2021  . Complication of anesthesia   . Cough   . Family history of breast cancer   . Family history of lung cancer   . Family history of prostate cancer   . Family history of skin cancer   . GERD (gastroesophageal reflux disease)   . Headache   . High cholesterol   . Hypertension   . Hypothyroidism   . Irritable bowel   . Osteoarthritis    i.e right knee   . PONV (postoperative nausea and vomiting)   . Pre-diabetes   . Rheumatoid arthritis ([place])   . Thyroid disease     Past Surgical History:  Procedure Laterality Date  . ABDOMINAL HYSTERECTOMY     age 24 ovaries out  . BREAST LUMPECTOMY WITH RADIOACTIVE SEED AND SENTINEL LYMPH NODE BIOPSY Right 03/28/2019   Procedure: RIGHT BREAST LUMPECTOMY WITH RADIOACTIVE SEED AND SENTINEL LYMPH NODE BIOPSY;  Surgeon: Donnie Mesa, MD;  Location: [place];  Service:  General;  Laterality: Right;  PEC BLOCK FOR POST OP PAIN  . CATARACT EXTRACTION, BILATERAL    . ESOPHAGEAL DILATION     Dr. Olevia Perches 15 years from 2021  . TONSILLECTOMY    . TUBAL LIGATION      Family History  Problem Relation Age of Onset  . Hypertension Mother   . Thyroid disease Mother   . Breast cancer Mother 1       breast dx age 26 died age 59   . Prostate cancer Father 42       metastatic  . Stroke Maternal Grandmother   . Skin cancer Maternal Uncle        dx. in his 80s  . Lung cancer Maternal Uncle        smoker, died age 39    SOCIAL HX: married lives at home, retired    Current Outpatient Medications:  .  azelastine (ASTELIN) 0.1 % nasal spray, Place 1 spray into both nostrils 2 (two) times daily., Disp: 30 mL, Rfl: 12 .  budesonide-formoterol (SYMBICORT) 160-4.5 MCG/ACT inhaler, Inhale 2 puffs into the lungs 2 (two) times daily as needed., Disp: , Rfl:  .  Calcium Carbonate-Vitamin D (CALCIUM PLUS VITAMIN D PO), Take by mouth., Disp: , Rfl:  .  cyclobenzaprine (FLEXERIL) 10 MG tablet, Take 1 tablet (10 mg total) by mouth daily., Disp: 90  tablet, Rfl: 1 .  glucosamine-chondroitin 500-400 MG tablet, Take 1 tablet by mouth 3 (three) times daily., Disp: , Rfl:  .  leflunomide (ARAVA) 20 MG tablet, TAKE 1 TABLET(20 MG) BY MOUTH DAILY, Disp: 30 tablet, Rfl: 2 .  levothyroxine (SYNTHROID) 100 MCG tablet, Daily 30 minutes before food, Disp: 90 tablet, Rfl: 3 .  loratadine (CLARITIN) 10 MG tablet, Take 10 mg by mouth daily., Disp: , Rfl:  .  olmesartan-hydrochlorothiazide (BENICAR HCT) 20-12.5 MG tablet, Take 1 tablet by mouth daily. In am, Disp: 90 tablet, Rfl: 3 .  pravastatin (PRAVACHOL) 20 MG tablet, Take 1 tablet (20 mg total) by mouth at bedtime., Disp: 90 tablet, Rfl: 3 .  RESTASIS 0.05 % ophthalmic emulsion, daily. , Disp: , Rfl:  .  tamoxifen (NOLVADEX) 20 MG tablet, Take 1 tablet (20 mg total) by mouth daily., Disp: 30 tablet, Rfl: 5 .  topiramate (TOPAMAX) 100 MG  tablet, Take 1 tablet (100 mg total) by mouth daily., Disp: 90 tablet, Rfl: 3 .  betamethasone dipropionate (DIPROLENE) 0.05 % cream, Apply topically 2 (two) times daily. (Patient not taking: Reported on 09/28/2019), Disp: 45 g, Rfl: 3 .  Dexlansoprazole (DEXILANT) 30 MG capsule, Take 1 capsule (30 mg total) by mouth daily. (Patient not taking: Reported on 09/28/2019), Disp: 30 capsule, Rfl: 3 .  hydrocortisone cream 1 %, Apply 1 application topically. (Patient not taking: Reported on 09/28/2019), Disp: , Rfl:  .  promethazine (PHENERGAN) 25 MG tablet, Take 1 tablet (25 mg total) by mouth 2 (two) times daily as needed. (Patient not taking: Reported on 09/28/2019), Disp: 30 tablet, Rfl: 5 .  sitaGLIPtin (JANUVIA) 25 MG tablet, Take 1 tablet (25 mg total) by mouth daily., Disp: 90 tablet, Rfl: 3 .  SUMAtriptan (IMITREX) 50 MG tablet, TAKE 1 TABLET BY MOUTH AS NEEDED FOR HEADACHE (Patient not taking: Reported on 09/28/2019), Disp: 9 tablet, Rfl: 11 .  traMADol (ULTRAM) 50 MG tablet, Take 2 tablets by mouth twice daily as needed for pain relief. (Patient not taking: Reported on 09/28/2019), Disp: 120 tablet, Rfl: 0  EXAM:  VITALS per patient if applicable:  GENERAL: alert, oriented, appears well and in no acute distress  PSYCH/NEURO: pleasant and cooperative, no obvious depression or anxiety, speech and thought processing grossly intact  ASSESSMENT AND PLAN:  Discussed the following assessment and plan:  Type 2 diabetes mellitus with hyperglycemia, without long-term current use of insulin ([place]) - Plan: sitaGLIPtin (JANUVIA) 25 MG tablet  Persistent cough likely due to below Bronchiectasis without complication (HCC) Pulmonary fibrosis ([place]) -disc nintedanib/pirfenidone for PF can disc with pulm vs also disc with rheum given RA Azathioprine, cellcept, Ritximab for NSIP/UIP noted on CT chest  Pulmonary nodules F/u pulm  Disc consider echo   Aortic atherosclerosis ([place]) Hypertension associated with  diabetes ([place]) Control risk factors  cont meds Disc consider echo and cards referral in future pt to let me know   Paresthesia of upper extremities b/l and b/l face  Consider MRI with and w/o contrast bran Ncs/emg with neurology in the future  Pt to get back with me on this if sx's persist Advised this could be related to medication as she is on anti breast cancer meds with side effects neuropathy   -we discussed possible serious and likely etiologies, options for evaluation and workup, limitations of telemedicine visit vs in person visit, treatment, treatment risks and precautions. Pt prefers to treat via telemedicine empirically rather then risking or undertaking an in person visit at this  moment.  Work/School slipped offered: Advised to seek prompt follow up telemedicine visit or in person care if worsening, new symptoms arise, or if is not improving with treatment. Did let her know that I only do telemedicine on Tuesdays and Thursdays for [facility] and advised follow up visit with PCP or [facility] if needs follow up or if any further questions arise to avoid any delays.   I discussed the assessment and treatment plan with the patient. The patient was provided an opportunity to ask questions and all were answered. The patient agreed with the plan and demonstrated an understanding of the instructions.   The patient was advised to call back or seek an in-person evaluation if the symptoms worsen or if the condition fails to improve as anticipated.  Time spent 20 minutes Delorise Jackson, MD

## 2019-09-28 NOTE — Patient Instructions (Signed)
2 medications below for pulmonary fibrosis  Nintedanib  Pirfenidone    Treatment for nonspecific interstitial pneumonia  Azathioprine cellcept  Rituximab    Consider echo of heart  Consider MRI of your brain and Neurology consult for tingling in arms/face    Peripheral Neuropathy Peripheral neuropathy is a type of nerve damage. It affects nerves that carry signals between the spinal cord and the arms, legs, and the rest of the body (peripheral nerves). It does not affect nerves in the spinal cord or brain. In peripheral neuropathy, one nerve or a group of nerves may be damaged. Peripheral neuropathy is a broad category that includes many specific nerve disorders, like diabetic neuropathy, hereditary neuropathy, and carpal tunnel syndrome. What are the causes? This condition may be caused by:  Diabetes. This is the most common cause of peripheral neuropathy.  Nerve injury.  Pressure or stress on a nerve that lasts a long time.  Lack (deficiency) of B vitamins. This can result from alcoholism, poor diet, or a restricted diet.  Infections.  Autoimmune diseases, such as rheumatoid arthritis and systemic lupus erythematosus.  Nerve diseases that are passed from parent to child (inherited).  Some medicines, such as cancer medicines (chemotherapy).  Poisonous (toxic) substances, such as lead and mercury.  Too little blood flowing to the legs.  Kidney disease.  Thyroid disease. In some cases, the cause of this condition is not known. What are the signs or symptoms? Symptoms of this condition depend on which of your nerves is damaged. Common symptoms include:  Loss of feeling (numbness) in the feet, hands, or both.  Tingling in the feet, hands, or both.  Burning pain.  Very sensitive skin.  Weakness.  Not being able to move a part of the body (paralysis).  Muscle twitching.  Clumsiness or poor coordination.  Loss of balance.  Not being able to control your  bladder.  Feeling dizzy.  Sexual problems. How is this diagnosed? Diagnosing and finding the cause of peripheral neuropathy can be difficult. Your health care provider will take your medical history and do a physical exam. A neurological exam will also be done. This involves checking things that are affected by your brain, spinal cord, and nerves (nervous system). For example, your health care provider will check your reflexes, how you move, and what you can feel. You may have other tests, such as:  Blood tests.  Electromyogram (EMG) and nerve conduction tests. These tests check nerve function and how well the nerves are controlling the muscles.  Imaging tests, such as CT scans or MRI to rule out other causes of your symptoms.  Removing a small piece of nerve to be examined in a lab (nerve biopsy). This is rare.  Removing and examining a small amount of the fluid that surrounds the brain and spinal cord (lumbar puncture). This is rare. How is this treated? Treatment for this condition may involve:  Treating the underlying cause of the neuropathy, such as diabetes, kidney disease, or vitamin deficiencies.  Stopping medicines that can cause neuropathy, such as chemotherapy.  Medicine to relieve pain. Medicines may include: ? Prescription or over-the-counter pain medicine. ? Antiseizure medicine. ? Antidepressants. ? Pain-relieving patches that are applied to painful areas of skin.  Surgery to relieve pressure on a nerve or to destroy a nerve that is causing pain.  Physical therapy to help improve movement and balance.  Devices to help you move around (assistive devices). Follow these instructions at home: Medicines  Take over-the-counter and prescription  medicines only as told by your health care provider. Do not take any other medicines without first asking your health care provider.  Do not drive or use heavy machinery while taking prescription pain  medicine. Lifestyle   Do not use any products that contain nicotine or tobacco, such as cigarettes and e-cigarettes. Smoking keeps blood from reaching damaged nerves. If you need help quitting, ask your health care provider.  Avoid or limit alcohol. Too much alcohol can cause a vitamin B deficiency, and vitamin B is needed for healthy nerves.  Eat a healthy diet. This includes: ? Eating foods that are high in fiber, such as fresh fruits and vegetables, whole grains, and beans. ? Limiting foods that are high in fat and processed sugars, such as fried or sweet foods. General instructions   If you have diabetes, work closely with your health care provider to keep your blood sugar under control.  If you have numbness in your feet: ? Check every day for signs of injury or infection. Watch for redness, warmth, and swelling. ? Wear padded socks and comfortable shoes. These help protect your feet.  Develop a good support system. Living with peripheral neuropathy can be stressful. Consider talking with a mental health specialist or joining a support group.  Use assistive devices and attend physical therapy as told by your health care provider. This may include using a walker or a cane.  Keep all follow-up visits as told by your health care provider. This is important. Contact a health care provider if:  You have new signs or symptoms of peripheral neuropathy.  You are struggling emotionally from dealing with peripheral neuropathy.  Your pain is not well-controlled. Get help right away if:  You have an injury or infection that is not healing normally.  You develop new weakness in an arm or leg.  You fall frequently. Summary  Peripheral neuropathy is when the nerves in the arms, or legs are damaged, resulting in numbness, weakness, or pain.  There are many causes of peripheral neuropathy, including diabetes, pinched nerves, vitamin deficiencies, autoimmune disease, and hereditary  conditions.  Diagnosing and finding the cause of peripheral neuropathy can be difficult. Your health care provider will take your medical history, do a physical exam, and do tests, including blood tests and nerve function tests.  Treatment involves treating the underlying cause of the neuropathy and taking medicines to help control pain. Physical therapy and assistive devices may also help. This information is not intended to replace advice given to you by your health care provider. Make sure you discuss any questions you have with your health care provider. Document Revised: 12/17/2016 Document Reviewed: 03/15/2016 Elsevier Patient Education  2020 [place].  Neuropathic Pain Neuropathic pain is pain caused by damage to the nerves that are responsible for certain sensations in your body (sensory nerves). The pain can be caused by:  Damage to the sensory nerves that send signals to your spinal cord and brain (peripheral nervous system).  Damage to the sensory nerves in your brain or spinal cord (central nervous system). Neuropathic pain can make you more sensitive to pain. Even a minor sensation can feel very painful. This is usually a long-term condition that can be difficult to treat. The type of pain differs from person to person. It may:  Start suddenly (acute), or it may develop slowly and last for a long time (chronic).  Come and go as damaged nerves heal, or it may stay at the same level for years.  Cause emotional distress, loss of sleep, and a lower quality of life. What are the causes? The most common cause of this condition is diabetes. Many other diseases and conditions can also cause neuropathic pain. Causes of neuropathic pain can be classified as:  Toxic. This is caused by medicines and chemicals. The most common cause of toxic neuropathic pain is damage from cancer treatments (chemotherapy).  Metabolic. This can be caused by: ? Diabetes. This is the most common disease  that damages the nerves. ? Lack of vitamin B from long-term alcohol abuse.  Traumatic. Any injury that cuts, crushes, or stretches a nerve can cause damage and pain. A common example is feeling pain after losing an arm or leg (phantom limb pain).  Compression-related. If a sensory nerve gets trapped or compressed for a long period of time, the blood supply to the nerve can be cut off.  Vascular. Many blood vessel diseases can cause neuropathic pain by decreasing blood supply and oxygen to nerves.  Autoimmune. This type of pain results from diseases in which the body's defense system (immune system) mistakenly attacks sensory nerves. Examples of autoimmune diseases that can cause neuropathic pain include lupus and multiple sclerosis.  Infectious. Many types of viral infections can damage sensory nerves and cause pain. Shingles infection is a common cause of this type of pain.  Inherited. Neuropathic pain can be a symptom of many diseases that are passed down through families (genetic). What increases the risk? You are more likely to develop this condition if:  You have diabetes.  You smoke.  You drink too much alcohol.  You are taking certain medicines, including medicines that kill cancer cells (chemotherapy) or that treat immune system disorders. What are the signs or symptoms? The main symptom is pain. Neuropathic pain is often described as:  Burning.  Shock-like.  Stinging.  Hot or cold.  Itching. How is this diagnosed? No single test can diagnose neuropathic pain. It is diagnosed based on:  Physical exam and your symptoms. Your health care provider will ask you about your pain. You may be asked to use a pain scale to describe how bad your pain is.  Tests. These may be done to see if you have a high sensitivity to pain and to help find the cause and location of any sensory nerve damage. They include: ? Nerve conduction studies to test how well nerve signals travel through  your sensory nerves (electrodiagnostic testing). ? Stimulating your sensory nerves through electrodes on your skin and measuring the response in your spinal cord and brain (somatosensory evoked potential).  Imaging studies, such as: ? X-rays. ? CT scan. ? MRI. How is this treated? Treatment for neuropathic pain may change over time. You may need to try different treatment options or a combination of treatments. Some options include:  Treating the underlying cause of the neuropathy, such as diabetes, kidney disease, or vitamin deficiencies.  Stopping medicines that can cause neuropathy, such as chemotherapy.  Medicine to relieve pain. Medicines may include: ? Prescription or over-the-counter pain medicine. ? Anti-seizure medicine. ? Antidepressant medicines. ? Pain-relieving patches that are applied to painful areas of skin. ? A medicine to numb the area (local anesthetic), which can be injected as a nerve block.  Transcutaneous nerve stimulation. This uses electrical currents to block painful nerve signals. The treatment is painless.  Alternative treatments, such as: ? Acupuncture. ? Meditation. ? Massage. ? Physical therapy. ? Pain management programs. ? Counseling. Follow these instructions at home: Medicines  Take over-the-counter and prescription medicines only as told by your health care provider.  Do not drive or use heavy machinery while taking prescription pain medicine.  If you are taking prescription pain medicine, take actions to prevent or treat constipation. Your health care provider may recommend that you: ? Drink enough fluid to keep your urine pale yellow. ? Eat foods that are high in fiber, such as fresh fruits and vegetables, whole grains, and beans. ? Limit foods that are high in fat and processed sugars, such as fried or sweet foods. ? Take an over-the-counter or prescription medicine for constipation. Lifestyle   Have a good support system at  home.  Consider joining a chronic pain support group.  Do not use any products that contain nicotine or tobacco, such as cigarettes and e-cigarettes. If you need help quitting, ask your health care provider.  Do not drink alcohol. General instructions  Learn as much as you can about your condition.  Work closely with all your health care providers to find the treatment plan that works best for you.  Ask your health care provider what activities are safe for you.  Keep all follow-up visits as told by your health care provider. This is important. Contact a health care provider if:  Your pain treatments are not working.  You are having side effects from your medicines.  You are struggling with tiredness (fatigue), mood changes, depression, or anxiety. Summary  Neuropathic pain is pain caused by damage to the nerves that are responsible for certain sensations in your body (sensory nerves).  Neuropathic pain may come and go as damaged nerves heal, or it may stay at the same level for years.  Neuropathic pain is usually a long-term condition that can be difficult to treat. Consider joining a chronic pain support group. This information is not intended to replace advice given to you by your health care provider. Make sure you discuss any questions you have with your health care provider. Document Revised: 04/27/2018 Document Reviewed: 01/21/2017 Elsevier Patient Education  2020 [place].  Pulmonary Fibrosis  Pulmonary fibrosis is a type of lung disease that causes scarring. Over time, the scar tissue builds up in the air sacs of your lungs (alveoli). This makes it hard for you to breathe. Less oxygen can get into your blood. Scarring from pulmonary fibrosis gets worse over time. This damage is permanent and may lead to other serious health problems. What are the causes? There are many different causes of pulmonary fibrosis. Sometimes the cause is not known. This is called  idiopathic pulmonary fibrosis. Other causes include:  Exposure to chemicals and substances found in agricultural, farm, Architect, or factory work. These include mold, asbestos, silica, metal dusts, and toxic fumes.  Sarcoidosis. In this disease, areas of inflammatory cells (granulomas) form and most often affect the lungs.  Autoimmune diseases. These include diseases such as rheumatoid arthritis, systemic sclerosis, or connective tissue disease.  Taking certain medicines. These include drugs used in radiation therapy or used to treat seizures, heart problems, and some infections. What increases the risk? You are more likely to develop this condition if:  You have a family history of the disease.  You are older. The condition is more common in older adults.  You have a history of smoking.  You have a job that exposes you to certain chemicals.  You have gastroesophageal reflux disease (GERD). What are the signs or symptoms? Symptoms of this condition include:  Difficulty breathing that gets worse with  activity.  Shortness of breath (dyspnea).  Dry, hacking cough.  Rapid, shallow breathing during exercise or while at rest.  Bluish skin and lips.  Loss of appetite.  Weakness.  Weight loss and fatigue.  Rounded and enlarged fingertips (clubbing). How is this diagnosed? This condition may be diagnosed based on:  Your symptoms and medical history.  A physical exam. You may also have tests, including:  A test that involves looking inside your lungs with an instrument (bronchoscopy).  Imaging studies of your lungs and heart.  Tests to measure how well you are breathing (pulmonary function tests).  Blood tests.  Tests to see how well your lungs work while you are walking (pulmonary stress test).  A procedure to remove a lung tissue sample to look at it under a microscope (biopsy). How is this treated? There is no cure for pulmonary fibrosis. Treatment focuses on  managing symptoms and preventing scarring from getting worse. This may include:  Medicines, such as: ? Steroids to prevent permanent lung changes. ? Medicines to suppress your body's defense system (immune system). ? Medicines to help with lung function by reducing inflammation or scarring.  Ongoing monitoring with X-rays and lab work.  Oxygen therapy.  Pulmonary rehabilitation.  Surgery. In some cases, a lung transplant is possible. Follow these instructions at home:     Medicines  Take over-the-counter and prescription medicines only as told by your health care provider.  Keep your vaccinations up to date as recommended by your health care provider. General instructions  Do not use any products that contain nicotine or tobacco, such as cigarettes and e-cigarettes. If you need help quitting, ask your health care provider.  Get regular exercise, but do not overexert yourself. Ask your health care provider to suggest some activities that are safe for you to do. ? If you have physical limitations, you may get exercise by walking, using a stationary bike, or doing chair exercises. ? Ask your health care provider about using oxygen while exercising.  If you are exposed to chemicals and substances at work, make sure that you wear a mask or respirator at all times.  Join a pulmonary rehabilitation program or a support group for people with pulmonary fibrosis.  Eat small meals often so you do not get too full. Overeating can make breathing trouble worse.  Maintain a healthy weight. Lose weight if you need to.  Do breathing exercises as directed by your health care provider.  Keep all follow-up visits as told by your health care provider. This is important. Contact a health care provider if you:  Have symptoms that do not get better with medicines.  Are not able to be as active as usual.  Have trouble taking a deep breath.  Have a fever or chills.  Have blue lips or  skin.  Have clubbing of your fingers. Get help right away if you:  Have a sudden worsening of your symptoms.  Have chest pain.  Cough up mucus that is dark in color.  Have a lot of headaches.  Get very confused or sleepy. Summary  Pulmonary fibrosis is a type of lung disease that causes scar tissue to build up in the air sacs of your lungs (alveoli) over time. Less oxygen can get into your blood. This makes it hard for you to breathe.  Scarring from pulmonary fibrosis gets worse over time. This damage is permanent and may lead to other serious health problems.  You are more likely to develop this condition  if you have a family history of the condition or a job that exposes you to certain chemicals.  There is no cure for pulmonary fibrosis. Treatment focuses on managing symptoms and preventing scarring from getting worse. This information is not intended to replace advice given to you by your health care provider. Make sure you discuss any questions you have with your health care provider. Document Revised: 02/09/2017 Document Reviewed: 02/09/2017 Elsevier Patient Education  [place].  Sitagliptin oral tablet What is this medicine? SITAGLIPTIN (sit a GLIP tin) helps to treat type 2 diabetes. It helps to control blood sugar. Treatment is combined with diet and exercise. This medicine may be used for other purposes; ask your health care provider or pharmacist if you have questions. COMMON BRAND NAME(S): Januvia What should I tell my health care provider before I take this medicine? They need to know if you have any of these conditions:  diabetic ketoacidosis  kidney disease  pancreatitis  previous swelling of the tongue, face, or lips with difficulty breathing, difficulty swallowing, hoarseness, or tightening of the throat  type 1 diabetes  an unusual or allergic reaction to sitagliptin, other medicines, foods, dyes, or preservatives  pregnant or trying to get  pregnant  breast-feeding How should I use this medicine? Take this medicine by mouth with a glass of water. Follow the directions on the prescription label. You can take it with or without food. Do not cut, crush or chew this medicine. Take your dose at the same time each day. Do not take more often than directed. Do not stop taking except on your doctor's advice. A special MedGuide will be given to you by the pharmacist with each prescription and refill. Be sure to read this information carefully each time. Talk to your pediatrician regarding the use of this medicine in children. Special care may be needed. Overdosage: If you think you have taken too much of this medicine contact a poison control center or emergency room at once. NOTE: This medicine is only for you. Do not share this medicine with others. What if I miss a dose? If you miss a dose, take it as soon as you can. If it is almost time for your next dose, take only that dose. Do not take double or extra doses. What may interact with this medicine? Do not take this medicine with any of the following medications:  gatifloxacin This medicine may also interact with the following medications:  alcohol  digoxin  insulin  sulfonylureas like glimepiride, glipizide, glyburide This list may not describe all possible interactions. Give your health care provider a list of all the medicines, herbs, non-prescription drugs, or dietary supplements you use. Also tell them if you smoke, drink alcohol, or use illegal drugs. Some items may interact with your medicine. What should I watch for while using this medicine? Visit your doctor or health care professional for regular checks on your progress. A test called the HbA1C (A1C) will be monitored. This is a simple blood test. It measures your blood sugar control over the last 2 to 3 months. You will receive this test every 3 to 6 months. Learn how to check your blood sugar. Learn the symptoms of  low and high blood sugar and how to manage them. Always carry a quick-source of sugar with you in case you have symptoms of low blood sugar. Examples include hard sugar candy or glucose tablets. Make sure others know that you can choke if you eat or drink  when you develop serious symptoms of low blood sugar, such as seizures or unconsciousness. They must get medical help at once. Tell your doctor or health care professional if you have high blood sugar. You might need to change the dose of your medicine. If you are sick or exercising more than usual, you might need to change the dose of your medicine. Do not skip meals. Ask your doctor or health care professional if you should avoid alcohol. Many nonprescription cough and cold products contain sugar or alcohol. These can affect blood sugar. Wear a medical ID bracelet or chain, and carry a card that describes your disease and details of your medicine and dosage times. What side effects may I notice from receiving this medicine? Side effects that you should report to your doctor or health care professional as soon as possible:  allergic reactions like skin rash, itching or hives, swelling of the face, lips, or tongue  breathing problems  general ill feeling or flu-like symptoms  joint pain  loss of appetite  redness, blistering, peeling or loosening of the skin, including inside the mouth  signs and symptoms of heart failure like breathing problems, fast, irregular heartbeat, sudden weight gain; swelling of the ankles, feet, hands; unusually weak or tired  signs and symptoms of low blood sugar such as feeling anxious, confusion, dizziness, increased hunger, unusually weak or tired, sweating, shakiness, cold, irritable, headache, blurred vision, fast heartbeat, loss of consciousness  signs and symptoms of muscle injury like dark urine; trouble passing urine or change in the amount of urine; unusually weak or tired; muscle pain; back  pain  unusual stomach upset or pain  vomiting Side effects that usually do not require medical attention (report to your doctor or health care professional if they continue or are bothersome):  diarrhea  headache  sore throat  stomach upset  stuffy or runny nose This list may not describe all possible side effects. Call your doctor for medical advice about side effects. You may report side effects to FDA at 1-800-FDA-1088. Where should I keep my medicine? Keep out of the reach of children. Store at room temperature between 15 and 30 degrees C (59 and 86 degrees F). Throw away any unused medicine after the expiration date. NOTE: This sheet is a summary. It may not cover all possible information. If you have questions about this medicine, talk to your doctor, pharmacist, or health care provider.  2020 Elsevier/[employer] (2017-08-09 16:38:43)

## 2019-10-01 DIAGNOSIS — J479 Bronchiectasis, uncomplicated: Secondary | ICD-10-CM | POA: Insufficient documentation

## 2019-10-01 DIAGNOSIS — I700 Atherosclerosis of aorta: Secondary | ICD-10-CM | POA: Insufficient documentation

## 2019-10-01 DIAGNOSIS — E1159 Type 2 diabetes mellitus with other circulatory complications: Secondary | ICD-10-CM | POA: Insufficient documentation

## 2019-10-01 DIAGNOSIS — J8410 Pulmonary fibrosis, unspecified: Secondary | ICD-10-CM | POA: Insufficient documentation

## 2019-10-01 DIAGNOSIS — R918 Other nonspecific abnormal finding of lung field: Secondary | ICD-10-CM | POA: Insufficient documentation

## 2019-10-01 DIAGNOSIS — R202 Paresthesia of skin: Secondary | ICD-10-CM | POA: Insufficient documentation

## 2019-10-02 ENCOUNTER — Telehealth: Payer: Self-pay | Admitting: Rheumatology

## 2019-10-02 ENCOUNTER — Encounter: Payer: Self-pay | Admitting: Rheumatology

## 2019-10-02 NOTE — Telephone Encounter (Signed)
Please see rheumatology note and CT chest

## 2019-10-02 NOTE — Telephone Encounter (Signed)
Beth please advise on patient mychart message   Please give me a call at [phone] to go over a plan for treatment. Had CT scan done on 09/26/19

## 2019-10-02 NOTE — Telephone Encounter (Signed)
I called patient back after I received a note from her PCP.  I reviewed high-resolution CT results with her.  She is currently on leflunomide.  I discussed possible use of Imuran with her.  I would like the input from her pulmonologist.  We will schedule an appointment this week to discuss change in therapy.  Bo Merino, MD

## 2019-10-02 NOTE — Telephone Encounter (Signed)
Early findings of fibrosis, may be from underlying RA disease. Make sure she is following with rheumatology.  Recommend she follow-up with MR in [place] in 3-6 months.

## 2019-10-03 ENCOUNTER — Telehealth: Payer: Self-pay | Admitting: *Deleted

## 2019-10-03 NOTE — Telephone Encounter (Signed)
Patient scheduled for 10/04/2019 at 8:00 am.

## 2019-10-03 NOTE — Progress Notes (Signed)
Office Visit Note  Patient: Cheryl Knight             Date of Birth: 1955/11/17           MRN: [id number]             PCP: McLean-Scocuzza, Nino Glow, MD Referring: Orland Mustard * Visit Date: 10/04/2019 Occupation: @GUAROCC @  Subjective:  Medication management.   History of Present Illness: Cheryl Knight is a 64 y.o. female history of rheumatoid arthritis and osteoarthritis overlap.  She states she has been having a cough for last 1 year.  She recently had high-resolution CT done by pulmonologist.  Which showed some interstitial lung disease changes.  She states her rheumatoid arthritis is very well controlled on Arava.  She has no joint pain or joint swelling.  Has difficulty breathing especially in the afternoon.  Activities of Daily Living:  Patient reports morning stiffness for 0  minutes.   Patient Denies nocturnal pain.  Difficulty dressing/grooming: Denies Difficulty climbing stairs: Denies Difficulty getting out of chair: Denies Difficulty using hands for taps, buttons, cutlery, and/or writing: Denies  Review of Systems  Constitutional: Negative for fatigue.  HENT: Positive for mouth dryness and nose dryness. Negative for mouth sores.   Eyes: Positive for pain and dryness.  Respiratory: Positive for shortness of breath, wheezing and difficulty breathing.   Cardiovascular: Negative for chest pain and palpitations.  Gastrointestinal: Positive for constipation and diarrhea. Negative for blood in stool.  Endocrine: Positive for increased urination.  Genitourinary: Negative for difficulty urinating and painful urination.  Musculoskeletal: Negative for arthralgias, joint pain, joint swelling, myalgias, morning stiffness, muscle tenderness and myalgias.  Skin: Negative for color change, rash and redness.  Allergic/Immunologic: Negative for susceptible to infections.  Neurological: Positive for parasthesias. Negative for dizziness, numbness, headaches, memory  loss and weakness.  Hematological: Negative for bruising/bleeding tendency.  Psychiatric/Behavioral: Negative for confusion and sleep disturbance.    PMFS History:  Patient Active Problem List   Diagnosis Date Noted  . Pulmonary fibrosis ([place]) 10/01/2019  . Pulmonary nodules 10/01/2019  . Bronchiectasis without complication ([place]) 93/57/0177  . Aortic atherosclerosis ([place]) 10/01/2019  . Hypertension associated with diabetes ([place]) 10/01/2019  . Paresthesia of upper extremity 10/01/2019  . Hiatal hernia 08/16/2019  . Esophagitis 08/16/2019  . Persistent cough 08/16/2019  . Dry eye syndrome 08/05/2019  . Glaucoma 08/05/2019  . Hypertensive retinopathy 08/05/2019  . Arteriosclerosis 08/05/2019  . Pterygium 08/05/2019  . Ocular rosacea 08/05/2019  . Genetic testing 05/02/2019  . Family history of breast cancer   . Family history of prostate cancer   . Family history of skin cancer   . Family history of lung cancer   . Malignant neoplasm of lower-outer quadrant of right breast of female, estrogen receptor positive ([place]) 03/19/2019  . Type 2 diabetes mellitus with hyperglycemia, without long-term current use of insulin ([place]) 02/08/2019  . Allergic rhinitis 02/08/2019  . Osteoarthritis   . Primary osteoarthritis involving multiple joints 11/15/2017  . Moderate asthma with acute exacerbation 05/03/2017  . Routine general medical examination at a health care facility 02/08/2017  . Primary osteoarthritis of both feet 02/10/2016  . Hx of migraine headaches 02/07/2016  . History of hypothyroidism 02/07/2016  . History of gastroesophageal reflux (GERD) 02/07/2016  . Obesity (BMI 30-39.9) 12/30/2015  . Basal cell carcinoma of chest wall 10/05/2011  . Hypertension 09/01/2010  . Hyperlipidemia 02/27/2008  . Hypothyroidism 02/21/2007  . Migraine 02/21/2007  . VAGINITIS, ATROPHIC 02/21/2007  .  Psoriasis 02/21/2007  . Sicca syndrome, unspecified ([place]) 02/21/2007  . GERD 06/29/2006  .  Rheumatoid arthritis ([place]) 06/29/2006    Past Medical History:  Diagnosis Date  . Cancer ([place]) 02/2019   right breast IDC  . Cataracts, bilateral    tbd surgery 2021  . Complication of anesthesia   . Cough   . Family history of breast cancer   . Family history of lung cancer   . Family history of prostate cancer   . Family history of skin cancer   . GERD (gastroesophageal reflux disease)   . Headache   . High cholesterol   . Hypertension   . Hypothyroidism   . Irritable bowel   . Osteoarthritis    i.e right knee   . PONV (postoperative nausea and vomiting)   . Pre-diabetes   . Rheumatoid arthritis ([place])   . Thyroid disease     Family History  Problem Relation Age of Onset  . Hypertension Mother   . Thyroid disease Mother   . Breast cancer Mother 51       breast dx age 71 died age 79   . Prostate cancer Father 10       metastatic  . Stroke Maternal Grandmother   . Skin cancer Maternal Uncle        dx. in his 85s  . Lung cancer Maternal Uncle        smoker, died age 25   Past Surgical History:  Procedure Laterality Date  . ABDOMINAL HYSTERECTOMY     age 61 ovaries out  . BREAST LUMPECTOMY WITH RADIOACTIVE SEED AND SENTINEL LYMPH NODE BIOPSY Right 03/28/2019   Procedure: RIGHT BREAST LUMPECTOMY WITH RADIOACTIVE SEED AND SENTINEL LYMPH NODE BIOPSY;  Surgeon: Donnie Mesa, MD;  Location: [facility];  Service: General;  Laterality: Right;  PEC BLOCK FOR POST OP PAIN  . CATARACT EXTRACTION, BILATERAL    . ESOPHAGEAL DILATION     Dr. Olevia Perches 15 years from 2021  . TONSILLECTOMY    . 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 H1N1 02/27/2008  . Influenza-Unspecified 11/18/2017  . PFIZER SARS-COV-2 Vaccination 03/02/2019, 03/29/2019  . Td 01/19/2004  . Tdap 12/17/2014     Objective: Vital Signs: BP 137/83 (BP Location: Left  Arm, Patient Position: Sitting, Cuff Size: Normal)   Pulse (!) 111   Resp 16   Ht 5\' 8"  (1.727 m)   Wt 208 lb (94.3 kg)   BMI 31.63 kg/m    Physical Exam Vitals and nursing note reviewed.  Constitutional:      Appearance: She is well-developed.  HENT:     Head: Normocephalic and atraumatic.  Eyes:     Conjunctiva/sclera: Conjunctivae normal.  Cardiovascular:     Rate and Rhythm: Normal rate and regular rhythm.     Heart sounds: Normal heart sounds.  Pulmonary:     Effort: Pulmonary effort is normal.     Breath sounds: Normal breath sounds.  Abdominal:     General: Bowel sounds are normal.     Palpations: Abdomen is soft.  Musculoskeletal:     Cervical back: Normal range of motion.  Lymphadenopathy:     Cervical: No cervical adenopathy.  Skin:    General: Skin is warm and dry.     Capillary Refill: Capillary refill takes less than  2 seconds.  Neurological:     Mental Status: She is alert and oriented to person, place, and time.  Psychiatric:        Behavior: Behavior normal.      Musculoskeletal Exam: C-spine thoracic and lumbar spine with good range of motion.  Shoulder joints, elbow joints, wrist joints with good range of motion.  She has bilateral PIP and DIP thickening with no synovitis.  Hip joints, knee joints, ankles, MTPs with good range of motion.  She is some thickening of her left ankle joint.  CDAI Exam: CDAI Score: 0.2  Patient Global: 1 mm; Provider Global: 1 mm Swollen: 0 ; Tender: 0  Joint Exam 10/04/2019   No joint exam has been documented for this visit   There is currently no information documented on the homunculus. Go to the Rheumatology activity and complete the homunculus joint exam.  Investigation: No additional findings.  Imaging: CT CHEST HIGH RESOLUTION  Result Date: 09/26/2019 CLINICAL DATA:  Persistent cough, rule out ILD, history of rheumatoid arthritis, smoking history EXAM: CT CHEST WITHOUT CONTRAST TECHNIQUE: Multidetector CT  imaging of the chest was performed following the standard protocol without intravenous contrast. High resolution imaging of the lungs, as well as inspiratory and expiratory imaging, was performed. COMPARISON:  None. FINDINGS: Cardiovascular: Scattered aortic atherosclerosis. Normal heart size. No pericardial effusion. Mediastinum/Nodes: No enlarged mediastinal, hilar, or axillary lymph nodes. Thyroid gland, trachea, and esophagus demonstrate no significant findings. Lungs/Pleura: There is mild, tubular bronchiectasis in the lower lobes and occasional bronchial plugging (e.g. Series 16, image 204). There is minimal peripheral interstitial opacity at the lung bases, including non dependent findings in the right middle lobe and lingula, and some suggestion of subpleural sparing (series 16, image 223). Segmental air trapping on expiratory phase imaging. There are innumerable tiny centrilobular pulmonary nodules, most concentrated in the upper lobes. No pleural effusion or pneumothorax. Upper Abdomen: No acute abnormality. Musculoskeletal: No chest wall mass or suspicious bone lesions identified. IMPRESSION: 1. There is minimal peripheral interstitial opacity at the lung bases, including non dependent findings in the right middle lobe and lingula, and some suggestion of subpleural sparing. Mild, tubular bronchiectasis in the lower lobes. Segmental air trapping on expiratory phase imaging. Constellation of findings is suggestive of mild pulmonary fibrosis in an early "indeterminate for UIP" pattern by ATS pulmonary fibrosis criteria, differential considerations including both NSIP and UIP. Consider follow-up ILD protocol CT in 1 year to assess for stability of fibrotic findings and pattern. Findings are indeterminate for UIP per consensus guidelines: Diagnosis of Idiopathic Pulmonary Fibrosis: An Official ATS/ERS/JRS/ALAT Clinical Practice Guideline. [place], Iss 5, [phone], Sep 18 2016. 2.  Innumerable tiny centrilobular pulmonary nodules, most concentrated in the upper lobes, most commonly seen in smoking-related respiratory bronchiolitis. 3. Aortic Atherosclerosis (ICD10-I70.0). Electronically Signed   By: Eddie Candle M.D.   On: 09/26/2019 17:00    Recent Labs: Lab Results  Component Value Date   WBC 4.7 09/04/2019   HGB 13.8 09/04/2019   PLT 258 09/04/2019   NA 142 09/04/2019   K 3.4 (L) 09/04/2019   CL 107 09/04/2019   CO2 26 09/04/2019   GLUCOSE 122 (H) 09/04/2019   BUN 13 09/04/2019   CREATININE 0.76 09/04/2019   BILITOT 0.4 09/04/2019   ALKPHOS 42 09/04/2019   AST 18 09/04/2019   ALT 12 09/04/2019   PROT 7.0 09/04/2019   ALBUMIN 3.6 09/04/2019   CALCIUM 10.5 (H) 09/04/2019   GFRAA >  60 09/04/2019   QFTBGOLDPLUS NEGATIVE 04/05/2019    Speciality Comments: No specialty comments available.  Procedures:  No procedures performed Allergies: Demerol [meperidine], Lisinopril, Meperidine hcl, and Pentazocine lactate   Assessment / Plan:     Visit Diagnoses: Rheumatoid arthritis involving multiple sites with positive rheumatoid factor (HCC) - +RF.  Patient is clinically doing very well on leflunomide.  She had no synovitis on examination.  High risk medication use - Arava 20 mg p.o. daily. previously on Methotrexate 0.8 ml every 7 days and folic acid 1 mg 2 tablets daily.  - Plan: Thiopurine methyltransferase(tpmt)rbc  ILD (interstitial lung disease) (HCC)-recently diagnosed on the basis of high-resolution CT.  She had been seeing Dr. Patsey Berthold for asthma.  Patient is unable to schedule an appointment with Dr. Patsey Berthold.  I had a discussion with Dr. Chase Caller regarding her high-resolution CT.  He was suggested doing a bronchoscopy before making any medication changes.  He will review the high-resolution CT and make suggestions.  He suggested holding off Imuran at this point.  History of asthma - patient has been diagnosed with asthma by Dr.  Patsey Berthold.  Psoriasis  Primary osteoarthritis of both knees-chronic pain  Primary osteoarthritis of both feet-doing well.  Sicca syndrome (HCC) -over-the-counter medications have been helpful.  History of hypothyroidism  History of hyperlipidemia  History of migraine  History of gastroesophageal reflux (GERD)  Malignant neoplasm of lower-outer quadrant of right breast of female, estrogen receptor positive ([place])  Former smoker-she quit smoking 3 to 4 years ago.  Educated about COVID-19 virus infection-all the information was placed in the AVS.   Orders: Orders Placed This Encounter  Procedures  . Thiopurine methyltransferase(tpmt)rbc   No orders of the defined types were placed in this encounter.     Follow-Up Instructions: Return in about 2 months (around 12/04/2019) for Rheumatoid arthritis,ILD.   Bo Merino, MD  Note - This record has been created using Editor, commissioning.  Chart creation errors have been sought, but may not always  have been located. Such creation errors do not reflect on  the standard of medical care.

## 2019-10-03 NOTE — Telephone Encounter (Signed)
I called patient back after I received a note from her PCP.  I reviewed high-resolution CT results with her.  She is currently on leflunomide.  I discussed possible use of Imuran with her.  I would like the input from her pulmonologist.  We will schedule an appointment this week to discuss change in therapy.  Bo Merino, MD

## 2019-10-04 ENCOUNTER — Encounter: Payer: Self-pay | Admitting: Rheumatology

## 2019-10-04 ENCOUNTER — Other Ambulatory Visit: Payer: Self-pay

## 2019-10-04 ENCOUNTER — Ambulatory Visit: Payer: PPO | Admitting: Rheumatology

## 2019-10-04 VITALS — BP 137/83 | HR 111 | Resp 16 | Ht 68.0 in | Wt 208.0 lb

## 2019-10-04 DIAGNOSIS — M170 Bilateral primary osteoarthritis of knee: Secondary | ICD-10-CM | POA: Diagnosis not present

## 2019-10-04 DIAGNOSIS — C50511 Malignant neoplasm of lower-outer quadrant of right female breast: Secondary | ICD-10-CM

## 2019-10-04 DIAGNOSIS — Z79899 Other long term (current) drug therapy: Secondary | ICD-10-CM

## 2019-10-04 DIAGNOSIS — L409 Psoriasis, unspecified: Secondary | ICD-10-CM | POA: Diagnosis not present

## 2019-10-04 DIAGNOSIS — Z8639 Personal history of other endocrine, nutritional and metabolic disease: Secondary | ICD-10-CM

## 2019-10-04 DIAGNOSIS — M0579 Rheumatoid arthritis with rheumatoid factor of multiple sites without organ or systems involvement: Secondary | ICD-10-CM | POA: Diagnosis not present

## 2019-10-04 DIAGNOSIS — Z8719 Personal history of other diseases of the digestive system: Secondary | ICD-10-CM

## 2019-10-04 DIAGNOSIS — J849 Interstitial pulmonary disease, unspecified: Secondary | ICD-10-CM

## 2019-10-04 DIAGNOSIS — Z8669 Personal history of other diseases of the nervous system and sense organs: Secondary | ICD-10-CM

## 2019-10-04 DIAGNOSIS — Z7189 Other specified counseling: Secondary | ICD-10-CM

## 2019-10-04 DIAGNOSIS — Z170 Estrogen receptor positive status [ER+]: Secondary | ICD-10-CM

## 2019-10-04 DIAGNOSIS — M3500 Sicca syndrome, unspecified: Secondary | ICD-10-CM | POA: Diagnosis not present

## 2019-10-04 DIAGNOSIS — M19071 Primary osteoarthritis, right ankle and foot: Secondary | ICD-10-CM | POA: Diagnosis not present

## 2019-10-04 DIAGNOSIS — Z8709 Personal history of other diseases of the respiratory system: Secondary | ICD-10-CM | POA: Diagnosis not present

## 2019-10-04 DIAGNOSIS — M19072 Primary osteoarthritis, left ankle and foot: Secondary | ICD-10-CM

## 2019-10-04 NOTE — Patient Instructions (Addendum)
Azathioprine tablets What is this medicine? AZATHIOPRINE (ay za THYE oh preen) suppresses the immune system. It is used to prevent organ rejection after a transplant. It is also used to treat rheumatoid arthritis. This medicine may be used for other purposes; ask your health care provider or pharmacist if you have questions. COMMON BRAND NAME(S): Azasan, Imuran What should I tell my health care provider before I take this medicine? They need to know if you have any of these conditions:  infection  kidney disease  liver disease  an unusual or allergic reaction to azathioprine, other medicines, lactose, foods, dyes, or preservatives  pregnant or trying to get pregnant  breast feeding How should I use this medicine? Take this medicine by mouth with a full glass of water. Follow the directions on the prescription label. Take your medicine at regular intervals. Do not take your medicine more often than directed. Continue to take your medicine even if you feel better. Do not stop taking except on your doctor's advice. Talk to your pediatrician regarding the use of this medicine in children. Special care may be needed. Overdosage: If you think you have taken too much of this medicine contact a poison control center or emergency room at once. NOTE: This medicine is only for you. Do not share this medicine with others. What if I miss a dose? If you miss a dose, take it as soon as you can. If it is almost time for your next dose, take only that dose. Do not take double or extra doses. What may interact with this medicine? Do not take this medicine with any of the following medications:  febuxostat  mercaptopurine This medicine may also interact with the following medications:  allopurinol  aminosalicylates like sulfasalazine, mesalamine, balsalazide, and olsalazine  leflunomide  medicines called ACE inhibitors like benazepril, captopril, enalapril, fosinopril, quinapril, lisinopril,  ramipril, and trandolapril  mycophenolate  sulfamethoxazole; trimethoprim  vaccines  warfarin This list may not describe all possible interactions. Give your health care provider a list of all the medicines, herbs, non-prescription drugs, or dietary supplements you use. Also tell them if you smoke, drink alcohol, or use illegal drugs. Some items may interact with your medicine. What should I watch for while using this medicine? Visit your doctor or health care professional for regular checks on your progress. You will need frequent blood checks during the first few months you are receiving the medicine. If you get a cold or other infection while receiving this medicine, call your doctor or health care professional. Do not treat yourself. The medicine may increase your risk of getting an infection. Women should inform their doctor if they wish to become pregnant or think they might be pregnant. There is a potential for serious side effects to an unborn child. Talk to your health care professional or pharmacist for more information. Men may have a reduced sperm count while they are taking this medicine. Talk to your health care professional for more information. This medicine may increase your risk of getting certain kinds of cancer. Talk to your doctor about healthy lifestyle choices, important screenings, and your risk. What side effects may I notice from receiving this medicine? Side effects that you should report to your doctor or health care professional as soon as possible:  allergic reactions like skin rash, itching or hives, swelling of the face, lips, or tongue  changes in vision  confusion  fever, chills, or any other sign of infection  loss of balance or coordination  severe stomach pain  unusual bleeding, bruising  unusually weak or tired  vomiting  yellowing of the eyes or skin Side effects that usually do not require medical attention (report to your doctor or health  care professional if they continue or are bothersome):  hair loss  nausea This list may not describe all possible side effects. Call your doctor for medical advice about side effects. You may report side effects to FDA at 1-800-FDA-1088. Where should I keep my medicine? Keep out of the reach of children. Store at room temperature between 15 and 25 degrees C (59 and 77 degrees F). Protect from light. Throw away any unused medicine after the expiration date. NOTE: This sheet is a summary. It may not cover all possible information. If you have questions about this medicine, talk to your doctor, pharmacist, or health care provider.  2020 Elsevier/Gold Standard (2013-05-01 12:00:31)  COVID-19 vaccine recommendations:   COVID-19 vaccine is recommended for everyone (unless you are allergic to a vaccine component), even if you are on a medication that suppresses your immune system.   If you are on Methotrexate, Cellcept (mycophenolate), Rinvoq, Morrie Sheldon, and Olumiant- hold the medication for 1 week after each vaccine. Hold Methotrexate for 2 weeks after the single dose COVID-19 vaccine.   If you are on Orencia subcutaneous injection - hold medication one week prior to and one week after the first COVID-19 vaccine dose (only).   If you are on Orencia IV infusions- time vaccination administration so that the first COVID-19 vaccination will occur four weeks after the infusion and postpone the subsequent infusion by one week.   If you are on Cyclophosphamide or Rituxan infusions please contact your doctor prior to receiving the COVID-19 vaccine.   Do not take Tylenol or any anti-inflammatory medications (NSAIDs) 24 hours prior to the COVID-19 vaccination.   There is no direct evidence about the efficacy of the COVID-19 vaccine in individuals who are on medications that suppress the immune system.   Even if you are fully vaccinated, and you are on any medications that suppress your immune system,  please continue to wear a mask, maintain at least six feet social distance and practice hand hygiene.   If you develop a COVID-19 infection, please contact your PCP or our office to determine if you need antibody infusion.  The booster vaccine is now available for immunocompromised patients. It is advised that if you had Pfizer vaccine you should get Coca-Cola booster.  If you had a Moderna vaccine then you should get a Moderna booster. Johnson and Wynetta Emery does not have a booster vaccine at this time.  Please see the following web sites for updated information.   [email or web address]://[email or web address]/[email or web address]/[email or web address]-19-Vaccination-Patient-Resources.[email or web address]  [email or web address]://[email or web address]/About-[email or web address]-[email or web address]/ID/[email or web address] Standing Labs We placed an order today for your standing lab work.   Please have your standing labs drawn in November and every 3 months  If possible, please have your labs drawn 2 weeks prior to your appointment so that the provider can discuss your results at your appointment.  We have open lab daily Monday through Thursday from 8:30-12:30 PM and 1:30-4:30 PM and Friday from 8:30-12:30 PM and 1:30-4:00 PM at the office of Dr. Bo Merino, [facility].   Please be advised, patients with office appointments requiring lab work will take precedents over walk-in lab work.  If possible, please come for your lab work on Monday and Friday afternoons, as you may experience shorter wait times. The office is located at [address], [place], [place], [place] [address]  No appointment is necessary.   Labs are drawn by [facility]. Please bring your co-pay at the time of your lab draw.  You may receive a bill from [facility] for your lab work.  If you wish to have your labs drawn at another location, please call the office 24 hours in advance to send orders.  If you have any questions regarding directions or hours of operation,  please call [phone].   As a reminder,  please drink plenty of water prior to coming for your lab work. Thanks!

## 2019-10-05 ENCOUNTER — Telehealth: Payer: Self-pay | Admitting: Internal Medicine

## 2019-10-05 NOTE — Progress Notes (Signed)
Agree with the details of the visit as noted by Elizabeth Walsh, NP.  C. Laura Kaidin Boehle, MD [facility] 

## 2019-10-05 NOTE — Telephone Encounter (Signed)
Faxed authorization confirmation fax form for prior to service-standard to [facility] [employer] faxed on 10-05-19

## 2019-10-05 NOTE — Telephone Encounter (Signed)
Aware of all the findings.  She has an appointment with me on Monday.  The case was discussed with Dr. Chase Caller who is not her primary pulmonologist however he conveyed the conversation with me.  I will be setting her up for bronchoscopy.

## 2019-10-08 ENCOUNTER — Encounter: Payer: Self-pay | Admitting: Pulmonary Disease

## 2019-10-08 ENCOUNTER — Other Ambulatory Visit: Payer: Self-pay

## 2019-10-08 ENCOUNTER — Ambulatory Visit: Payer: PPO | Admitting: Pulmonary Disease

## 2019-10-08 ENCOUNTER — Telehealth: Payer: Self-pay

## 2019-10-08 VITALS — BP 136/78 | HR 99 | Temp 97.8°F | Ht 68.0 in | Wt 206.8 lb

## 2019-10-08 DIAGNOSIS — M0579 Rheumatoid arthritis with rheumatoid factor of multiple sites without organ or systems involvement: Secondary | ICD-10-CM | POA: Diagnosis not present

## 2019-10-08 DIAGNOSIS — R918 Other nonspecific abnormal finding of lung field: Secondary | ICD-10-CM | POA: Diagnosis not present

## 2019-10-08 DIAGNOSIS — R053 Chronic cough: Secondary | ICD-10-CM

## 2019-10-08 DIAGNOSIS — R05 Cough: Secondary | ICD-10-CM

## 2019-10-08 DIAGNOSIS — K219 Gastro-esophageal reflux disease without esophagitis: Secondary | ICD-10-CM

## 2019-10-08 MED ORDER — SPIRIVA RESPIMAT 1.25 MCG/ACT IN AERS
2.0000 | INHALATION_SPRAY | Freq: Every day | RESPIRATORY_TRACT | 0 refills | Status: DC
Start: 2019-10-08 — End: 2019-10-09

## 2019-10-08 MED ORDER — SPIRIVA RESPIMAT 1.25 MCG/ACT IN AERS
2.0000 | INHALATION_SPRAY | Freq: Every day | RESPIRATORY_TRACT | 0 refills | Status: DC
Start: 2019-10-08 — End: 2020-04-08

## 2019-10-08 NOTE — Telephone Encounter (Signed)
Patient has been scheduled for bronchoscopy with BAL and transbronchal biopsy.  CPT [id number], [phone] DX: Pulmonary fibrosis   Rodena Piety, please see bronch info. Thanks

## 2019-10-08 NOTE — Patient Instructions (Signed)
We will proceed with a bronchoscopy for bronchial lavage and biopsies on 27 September.  We will see him in follow-up in 2 to 3 weeks with me or an APP.

## 2019-10-08 NOTE — Progress Notes (Signed)
Subjective:    Patient ID: Cheryl Knight, female    DOB: Mar 01, 1955, 64 y.o.   MRN: [id number]  HPI Patient is a 64 year old remote former smoker (total of approximately 7 pack years) who follows up for the issue of cough and recent high-resolution CT scan of the chest.  Patient has rheumatoid arthritis.  Patient was initially seen in May 2021 initially treated for GERD with PPI twice a day and initially cough got somewhat better.  She was also treated for presumed asthma however PFTs failed to show significant convincing finding of asthma.  Symbicort was subsequently discontinued.  Was last seen by me on July 2021 and referred to GI at that time for evaluation.  She did not follow up in August with our APP Derl Barrow, NP and a CT scan high resolution was obtained.  CT scan was performed on 26 September 2019.  This shows some mild interstitial changes of tubular bronchiectasis in the lower lobes.  There is evidence of tiny centrilobular pulmonary nodules most concentrated in the upper lobes.  There is minimal peripheral interstitial opacities in the lung.  All the findings are subtle.  During her visit with our APP she also had PPI switch to [place].  She has noted that her cough is now worse in the evenings but that overall, off is better since switched to Dexilant.  Because of her CT findings she will need further evaluation of potential interstitial lung disease particularly since she does have issues with rheumatoid arthritis.  Cylindrical bronchiectasis also raises the question of potential MAC.  Review of Systems A 10 point review of systems was performed and it is as noted above otherwise negative.  Allergies  Allergen Reactions  . Demerol [Meperidine]   . Lisinopril Cough  . Meperidine Hcl     REACTION: vomiting  . Pentazocine Lactate     REACTION: hallucinations   Current Meds  Medication Sig  . azelastine (ASTELIN) 0.1 % nasal spray Place 1 spray into both nostrils 2 (two)  times daily.  . betamethasone dipropionate (DIPROLENE) 0.05 % cream Apply topically 2 (two) times daily. (Patient taking differently: Apply topically as needed. )  . Calcium Carbonate-Vitamin D (CALCIUM PLUS VITAMIN D PO) Take by mouth.  . cyclobenzaprine (FLEXERIL) 10 MG tablet Take 1 tablet (10 mg total) by mouth daily.  Marland Kitchen Dexlansoprazole (DEXILANT) 30 MG capsule Take 1 capsule (30 mg total) by mouth daily.  Marland Kitchen glucosamine-chondroitin 500-400 MG tablet Take 1 tablet by mouth 3 (three) times daily.  . hydrocortisone cream 1 % Apply 1 application topically.   Marland Kitchen leflunomide (ARAVA) 20 MG tablet TAKE 1 TABLET(20 MG) BY MOUTH DAILY  . levothyroxine (SYNTHROID) 100 MCG tablet Daily 30 minutes before food  . loratadine (CLARITIN) 10 MG tablet Take 10 mg by mouth daily.  Marland Kitchen olmesartan-hydrochlorothiazide (BENICAR HCT) 20-12.5 MG tablet Take 1 tablet by mouth daily. In am  . pravastatin (PRAVACHOL) 20 MG tablet Take 1 tablet (20 mg total) by mouth at bedtime.  . promethazine (PHENERGAN) 25 MG tablet Take 1 tablet (25 mg total) by mouth 2 (two) times daily as needed.  . RESTASIS 0.05 % ophthalmic emulsion daily.   . SUMAtriptan (IMITREX) 50 MG tablet TAKE 1 TABLET BY MOUTH AS NEEDED FOR HEADACHE  . tamoxifen (NOLVADEX) 20 MG tablet Take 1 tablet (20 mg total) by mouth daily.  Marland Kitchen topiramate (TOPAMAX) 100 MG tablet Take 1 tablet (100 mg total) by mouth daily.   Immunization History  Administered Date(s)  Administered  . H1N1 02/27/2008  . Influenza-Unspecified 11/18/2017  . PFIZER SARS-COV-2 Vaccination 03/02/2019, 03/29/2019  . Pneumococcal Conjugate-13 10/03/2019  . Td 01/19/2004  . Tdap 12/17/2014   Social History   Tobacco Use  . Smoking status: Former Smoker    Packs/day: 0.25    Years: 7.00    Pack years: 1.75    Types: Cigarettes    Quit date: 2000    Years since quitting: 21.7  . Smokeless tobacco: Never Used  Substance Use Topics  . Alcohol use: Yes    Comment: socially     .     Objective:   Physical Exam BP 136/78 (BP Location: Left Arm, Cuff Size: Normal)   Pulse 99   Temp 97.8 F (36.6 C) (Temporal)   Ht _0  (1.727 m)   Wt 206 lb 12.8 oz (93.8 kg)   SpO2 96%   BMI 31.44 kg/m   GENERAL: Overweight woman, no acute distress. Fully ambulatory. HEAD: Normocephalic, atraumatic.  EYES: Pupils equal, round, reactive to light. No scleral icterus.  MOUTH:Nose/mouth/throat not examined due to masking requirements for COVID 19. NECK: Supple. No thyromegaly. Trachea midline. No JVD. No adenopathy. PULMONARY:Good air entry bilaterally,  coarse breath sounds otherwise, no other adventitious sounds.   CARDIOVASCULAR: S1 and S2. Regular rate and rhythm.Grade 1/6 systolic ejection murmur left sternal border, unchanged from prior. GASTROINTESTINAL:Obese, benign. MUSCULOSKELETAL: No joint deformity, no clubbing, no edema.  NEUROLOGIC:Awake, alert, oriented, fully ambulatory no gait disturbance noted. Speech is fluent, no overt focal deficits. SKIN: Intact,warm,dry.Limited exam shows no rashes. PSYCH:Mood and behavior appropriate  CT scan of the chest was independently reviewed findings are as noted above.     Assessment & Plan:     ICD-10-CM   1. Abnormal findings on diagnostic imaging of lung  R91.8    Findings consistent with early fibrosis Patient has history of rheumatoid arthritis Query UIP versus RA lung Bronchoscopy with BAL and transbronchial biopsie  2. Persistent cough  R05    Multifactorial ILD, GERD, tamoxifen among others Trial of Spiriva 1.25 mcg, 2 inhalations daily  3. Gastroesophageal reflux disease, unspecified whether esophagitis present  K21.9    Continue Dexilant Team antireflux measures  4. Rheumatoid arthritis involving multiple sites with positive rheumatoid factor (HCC)  M05.79    This issue adds complexity to her management Followed by Dr. Estanislado Pandy   Meds ordered this encounter  Medications  . Tiotropium Bromide  Monohydrate (SPIRIVA RESPIMAT) 1.25 MCG/ACT AERS    Sig: Inhale 2 puffs into the lungs daily.    Dispense:  4 g    Refill:  0    Order Specific Question:   Lot Number?    Answer:   [id number]    Order Specific Question:   Expiration Date?    Answer:   04/18/2020    Order Specific Question:   Manufacturer?    Answer:   [employer] [[id number]]    Order Specific Question:   Quantity    Answer:   1  . Tiotropium Bromide Monohydrate (SPIRIVA RESPIMAT) 1.25 MCG/ACT AERS    Sig: Inhale 2 puffs into the lungs daily.    Dispense:  4 g    Refill:  0    Order Specific Question:   Lot Number?    Answer:   [id number]    Order Specific Question:   Expiration Date?    Answer:   01/18/2021    Order Specific Question:   Quantity    Answer:   1   .  Discussion:  Patient has noted CT chest changes consistent with indeterminate findings for UIP the potential considerations would include NSIP.  Given the bronchiectasis potential for MAC is also a possibility.  To better assess this issue recommend bronchoscopy with bronchoalveolar lavage and transbronchial biopsies with endomicroscopy guidance and fluoroscopy.  Understands the potential benefits, limitations and complications of procedure agrees to go ahead.  This is been scheduled tentatively for 27 September at 1 PM.  Patient will see [place] in follow-up 2 to 3 weeks after that time.  She is to contact us prior to that time should any new difficulties arise.    Renold Don, MD [facility]   *This note was dictated using voice recognition software/Dragon.  Despite best efforts to proofread, errors can occur which can change the meaning.  Any change was purely unintentional.

## 2019-10-09 NOTE — Telephone Encounter (Signed)
Prior Auth Not Required for Codes # [id number], [phone] Refer # Marlou Starks 10/09/2019

## 2019-10-10 ENCOUNTER — Ambulatory Visit (INDEPENDENT_AMBULATORY_CARE_PROVIDER_SITE_OTHER): Payer: PPO

## 2019-10-10 VITALS — Ht 68.0 in | Wt 206.0 lb

## 2019-10-10 DIAGNOSIS — Z0000 Encounter for general adult medical examination without abnormal findings: Secondary | ICD-10-CM

## 2019-10-10 NOTE — Patient Instructions (Addendum)
Cheryl Knight , Thank you for taking time to come for your Medicare Wellness Visit. I appreciate your ongoing commitment to your health goals. Please review the following plan we discussed and let me know if I can assist you in the future.   These are the goals we discussed: Goals    . Follow up with Primary Care Provider       This is a list of the screening recommended for you and due dates:  Health Maintenance  Topic Date Due  . Flu Shot  08/19/2019  . Hemoglobin A1C  03/06/2020  . Eye exam for diabetics  07/15/2020  . Complete foot exam   08/27/2020  . Pneumonia vaccines (2 of 2 - PPSV23) 10/02/2020  . Mammogram  03/13/2021  . Tetanus Vaccine  12/16/2024  . Colon Cancer Screening  08/13/2029  . DEXA scan (bone density measurement)  Completed  . COVID-19 Vaccine  Completed  .  Hepatitis C: One time screening is recommended by Center for Disease Control  (CDC) for  adults born from [birth year] through 1967.   Completed   Immunizations Immunization History  Administered Date(s) Administered  . H1N1 02/27/2008  . Influenza-Unspecified 11/18/2017  . PFIZER SARS-COV-2 Vaccination 03/02/2019, 03/29/2019  . Pneumococcal Conjugate-13 10/03/2019  . Td 01/19/2004  . Tdap 12/17/2014   Keep all routine maintenance appointments.   Follow up 02/28/20 @ 9:30  Advanced directives: mailed per patient request.   Conditions/risks identified: none new.  Follow up in one year for your annual wellness visit.   Preventive Care 29 Years and Older, Female Preventive care refers to lifestyle choices and visits with your health care provider that can promote health and wellness. What does preventive care include?  A yearly physical exam. This is also called an annual well check.  Dental exams once or twice a year.  Routine eye exams. Ask your health care provider how often you should have your eyes checked.  Personal lifestyle choices, including:  Daily care of your teeth and  gums.  Regular physical activity.  Eating a healthy diet.  Avoiding tobacco and drug use.  Limiting alcohol use.  Practicing safe sex.  Taking low-dose aspirin every day.  Taking vitamin and mineral supplements as recommended by your health care provider. What happens during an annual well check? The services and screenings done by your health care provider during your annual well check will depend on your age, overall health, lifestyle risk factors, and family history of disease. Counseling  Your health care provider may ask you questions about your:  Alcohol use.  Tobacco use.  Drug use.  Emotional well-being.  Home and relationship well-being.  Sexual activity.  Eating habits.  History of falls.  Memory and ability to understand (cognition).  Work and work Statistician.  Reproductive health. Screening  You may have the following tests or measurements:  Height, weight, and BMI.  Blood pressure.  Lipid and cholesterol levels. These may be checked every 5 years, or more frequently if you are over 68 years old.  Skin check.  Lung cancer screening. You may have this screening every year starting at age 25 if you have a 30-pack-year history of smoking and currently smoke or have quit within the past 15 years.  Fecal occult blood test (FOBT) of the stool. You may have this test every year starting at age 6.  Flexible sigmoidoscopy or colonoscopy. You may have a sigmoidoscopy every 5 years or a colonoscopy every 10 years starting at age 3.  Hepatitis C blood test.  Hepatitis B blood test.  Sexually transmitted disease (STD) testing.  Diabetes screening. This is done by checking your blood sugar (glucose) after you have not eaten for a while (fasting). You may have this done every 1-3 years.  Bone density scan. This is done to screen for osteoporosis. You may have this done starting at age 68.  Mammogram. This may be done every 1-2 years. Talk to your  health care provider about how often you should have regular mammograms. Talk with your health care provider about your test results, treatment options, and if necessary, the need for more tests. Vaccines  Your health care provider may recommend certain vaccines, such as:  Influenza vaccine. This is recommended every year.  Tetanus, diphtheria, and acellular pertussis (Tdap, Td) vaccine. You may need a Td booster every 10 years.  Zoster vaccine. You may need this after age 75.  Pneumococcal 13-valent conjugate (PCV13) vaccine. One dose is recommended after age 40.  Pneumococcal polysaccharide (PPSV23) vaccine. One dose is recommended after age 43. Talk to your health care provider about which screenings and vaccines you need and how often you need them. This information is not intended to replace advice given to you by your health care provider. Make sure you discuss any questions you have with your health care provider. Document Released: 01/31/2015 Document Revised: 09/24/2015 Document Reviewed: 11/05/2014 Elsevier Interactive Patient Education  2017 [place] Prevention in the Home Falls can cause injuries. They can happen to people of all ages. There are many things you can do to make your home safe and to help prevent falls. What can I do on the outside of my home?  Regularly fix the edges of walkways and driveways and fix any cracks.  Remove anything that might make you trip as you walk through a door, such as a raised step or threshold.  Trim any bushes or trees on the path to your home.  Use bright outdoor lighting.  Clear any walking paths of anything that might make someone trip, such as rocks or tools.  Regularly check to see if handrails are loose or broken. Make sure that both sides of any steps have handrails.  Any raised decks and porches should have guardrails on the edges.  Have any leaves, snow, or ice cleared regularly.  Use sand or salt on walking  paths during winter.  Clean up any spills in your garage right away. This includes oil or grease spills. What can I do in the bathroom?  Use night lights.  Install grab bars by the toilet and in the tub and shower. Do not use towel bars as grab bars.  Use non-skid mats or decals in the tub or shower.  If you need to sit down in the shower, use a plastic, non-slip stool.  Keep the floor dry. Clean up any water that spills on the floor as soon as it happens.  Remove soap buildup in the tub or shower regularly.  Attach bath mats securely with double-sided non-slip rug tape.  Do not have throw rugs and other things on the floor that can make you trip. What can I do in the bedroom?  Use night lights.  Make sure that you have a light by your bed that is easy to reach.  Do not use any sheets or blankets that are too big for your bed. They should not hang down onto the floor.  Have a firm chair that has side  arms. You can use this for support while you get dressed.  Do not have throw rugs and other things on the floor that can make you trip. What can I do in the kitchen?  Clean up any spills right away.  Avoid walking on wet floors.  Keep items that you use a lot in easy-to-reach places.  If you need to reach something above you, use a strong step stool that has a grab bar.  Keep electrical cords out of the way.  Do not use floor polish or wax that makes floors slippery. If you must use wax, use non-skid floor wax.  Do not have throw rugs and other things on the floor that can make you trip. What can I do with my stairs?  Do not leave any items on the stairs.  Make sure that there are handrails on both sides of the stairs and use them. Fix handrails that are broken or loose. Make sure that handrails are as long as the stairways.  Check any carpeting to make sure that it is firmly attached to the stairs. Fix any carpet that is loose or worn.  Avoid having throw rugs at  the top or bottom of the stairs. If you do have throw rugs, attach them to the floor with carpet tape.  Make sure that you have a light switch at the top of the stairs and the bottom of the stairs. If you do not have them, ask someone to add them for you. What else can I do to help prevent falls?  Wear shoes that:  Do not have high heels.  Have rubber bottoms.  Are comfortable and fit you well.  Are closed at the toe. Do not wear sandals.  If you use a stepladder:  Make sure that it is fully opened. Do not climb a closed stepladder.  Make sure that both sides of the stepladder are locked into place.  Ask someone to hold it for you, if possible.  Clearly mark and make sure that you can see:  Any grab bars or handrails.  First and last steps.  Where the edge of each step is.  Use tools that help you move around (mobility aids) if they are needed. These include:  Canes.  Walkers.  Scooters.  Crutches.  Turn on the lights when you go into a dark area. Replace any light bulbs as soon as they burn out.  Set up your furniture so you have a clear path. Avoid moving your furniture around.  If any of your floors are uneven, fix them.  If there are any pets around you, be aware of where they are.  Review your medicines with your doctor. Some medicines can make you feel dizzy. This can increase your chance of falling. Ask your doctor what other things that you can do to help prevent falls. This information is not intended to replace advice given to you by your health care provider. Make sure you discuss any questions you have with your health care provider. Document Released: 10/31/2008 Document Revised: 06/12/2015 Document Reviewed: 02/08/2014 Elsevier Interactive Patient Education  2017 [employer].

## 2019-10-10 NOTE — Telephone Encounter (Addendum)
Pre admit phone visit 10/11/2019 between 8-1 and covid test 10/11/2019 between 8-1. Lm to make patient aware of date/time.

## 2019-10-10 NOTE — Telephone Encounter (Signed)
Lm x2 for patient.   

## 2019-10-10 NOTE — Telephone Encounter (Signed)
Patient is aware of below message and voiced her understanding.  Lm with pre admit testing to verify date of covid test. Patient is schedule covid test tomorrow.  Typically covid test is 3 days prior to bronch.  Will await call back to clarify.

## 2019-10-10 NOTE — Progress Notes (Signed)
Subjective:   Cheryl Knight is a 64 y.o. female who presents for an Initial Medicare Annual Wellness Visit.  Review of Systems    No ROS.  Medicare Wellness Virtual Visit.    Cardiac Risk Factors include: advanced age (>24men, >19 women);diabetes mellitus;hypertension     Objective:    Today's Vitals   10/10/19 1333  Weight: 206 lb (93.4 kg)  Height: 5\' 8"  (1.727 m)   Body mass index is 31.32 kg/m.  Advanced Directives 10/10/2019 03/28/2019 03/23/2019 03/21/2019 03/21/2019  Does Patient Have a Medical Advance Directive? No No No No No  Would patient like information on creating a medical advance directive? Yes (MAU/Ambulatory/Procedural Areas - Information given) No - Patient declined No - Patient declined No - Patient declined No - Patient declined    Current Medications (verified) Outpatient Encounter Medications as of 10/10/2019  Medication Sig  . aspirin-acetaminophen-caffeine (EXCEDRIN MIGRAINE) 250-250-65 MG tablet Take 2 tablets by mouth every 6 (six) hours as needed for headache.  Marland Kitchen azelastine (ASTELIN) 0.1 % nasal spray Place 1 spray into both nostrils 2 (two) times daily.  . betamethasone dipropionate (DIPROLENE) 0.05 % cream Apply topically 2 (two) times daily. (Patient taking differently: Apply 1 application topically 2 (two) times daily as needed (psoriasis / eczema). )  . Calcium Carbonate Antacid (TUMS CHEWY BITES PO) Take 1 tablet by mouth at bedtime as needed (acid reflux).  . Calcium Carbonate-Vitamin D (CALCIUM PLUS VITAMIN D PO) Take 1 tablet by mouth daily.   . cyclobenzaprine (FLEXERIL) 10 MG tablet Take 1 tablet (10 mg total) by mouth daily. (Patient taking differently: Take 10 mg by mouth at bedtime. )  . Dexlansoprazole (DEXILANT) 30 MG capsule Take 1 capsule (30 mg total) by mouth daily.  . famotidine-calcium carbonate-magnesium hydroxide (PEPCID COMPLETE) 10-800-165 MG chewable tablet Chew 1 tablet by mouth daily as needed (acid reflux).  Marland Kitchen  glucosamine-chondroitin 500-400 MG tablet Take 2 tablets by mouth daily.   . Guaifenesin 1200 MG TB12 Take 1,200 mg by mouth daily.  Marland Kitchen HYDROcodone-acetaminophen (NORCO) 7.5-325 MG tablet Take 1 tablet by mouth every 6 (six) hours as needed for moderate pain.  . hydrocortisone cream 1 % Apply 1 application topically daily as needed (rash).   Marland Kitchen leflunomide (ARAVA) 20 MG tablet TAKE 1 TABLET(20 MG) BY MOUTH DAILY (Patient taking differently: Take 20 mg by mouth daily. )  . levothyroxine (SYNTHROID) 100 MCG tablet Daily 30 minutes before food (Patient taking differently: Take 100 mcg by mouth daily before breakfast. )  . loratadine (CLARITIN) 10 MG tablet Take 10 mg by mouth every other day.   . loratadine-pseudoephedrine (CLARITIN-D 24-HOUR) 10-240 MG 24 hr tablet Take 1 tablet by mouth every other day.  . Multiple Vitamin (MULTIVITAMIN WITH MINERALS) TABS tablet Take 1 tablet by mouth daily.  Marland Kitchen olmesartan-hydrochlorothiazide (BENICAR HCT) 20-12.5 MG tablet Take 1 tablet by mouth daily. In am  . pravastatin (PRAVACHOL) 20 MG tablet Take 1 tablet (20 mg total) by mouth at bedtime.  . promethazine (PHENERGAN) 25 MG tablet Take 1 tablet (25 mg total) by mouth 2 (two) times daily as needed. (Patient taking differently: Take 25 mg by mouth 2 (two) times daily as needed for nausea or vomiting. )  . RESTASIS 0.05 % ophthalmic emulsion Place 1 drop into both eyes 2 (two) times daily.   . sitaGLIPtin (JANUVIA) 25 MG tablet Take 1 tablet (25 mg total) by mouth daily.  . SUMAtriptan (IMITREX) 50 MG tablet TAKE 1 TABLET BY MOUTH  AS NEEDED FOR HEADACHE (Patient taking differently: Take 50 mg by mouth every 2 (two) hours as needed for migraine. )  . tamoxifen (NOLVADEX) 20 MG tablet Take 1 tablet (20 mg total) by mouth daily.  . Tiotropium Bromide Monohydrate (SPIRIVA RESPIMAT) 1.25 MCG/ACT AERS Inhale 2 puffs into the lungs daily.  Marland Kitchen topiramate (TOPAMAX) 100 MG tablet Take 1 tablet (100 mg total) by mouth daily.    No facility-administered encounter medications on file as of 10/10/2019.    Allergies (verified) Demerol [meperidine], Lisinopril, and Pentazocine lactate   History: Past Medical History:  Diagnosis Date  . Cancer ([place]) 02/2019   right breast IDC  . Cataracts, bilateral    tbd surgery 2021  . Complication of anesthesia   . Cough   . Family history of breast cancer   . Family history of lung cancer   . Family history of prostate cancer   . Family history of skin cancer   . GERD (gastroesophageal reflux disease)   . Headache   . High cholesterol   . Hypertension   . Hypothyroidism   . Irritable bowel   . Osteoarthritis    i.e right knee   . PONV (postoperative nausea and vomiting)   . Pre-diabetes   . Rheumatoid arthritis ([place])   . Thyroid disease    Past Surgical History:  Procedure Laterality Date  . ABDOMINAL HYSTERECTOMY     age 30 ovaries out  . BREAST LUMPECTOMY WITH RADIOACTIVE SEED AND SENTINEL LYMPH NODE BIOPSY Right 03/28/2019   Procedure: RIGHT BREAST LUMPECTOMY WITH RADIOACTIVE SEED AND SENTINEL LYMPH NODE BIOPSY;  Surgeon: Donnie Mesa, MD;  Location: [facility];  Service: General;  Laterality: Right;  PEC BLOCK FOR POST OP PAIN  . CATARACT EXTRACTION, BILATERAL    . ESOPHAGEAL DILATION     Dr. Olevia Perches 15 years from 2021  . TONSILLECTOMY    . TUBAL LIGATION     Family History  Problem Relation Age of Onset  . Hypertension Mother   . Thyroid disease Mother   . Breast cancer Mother 17       breast dx age 19 died age 43   . Prostate cancer Father 72       metastatic  . Stroke Maternal Grandmother   . Skin cancer Maternal Uncle        dx. in his 40s  . Lung cancer Maternal Uncle        smoker, died age 29   Social History   Socioeconomic History  . Marital status: Married    Spouse name: Not on file  . Number of children: 2  . Years of education: Not on file  . Highest education level: Not on file  Occupational History  .  Occupation: nurse  Tobacco Use  . Smoking status: Former Smoker    Packs/day: 0.25    Years: 7.00    Pack years: 1.75    Types: Cigarettes    Quit date: 2000    Years since quitting: 21.7  . Smokeless tobacco: Never Used  Vaping Use  . Vaping Use: Never used  Substance and Sexual Activity  . Alcohol use: Yes    Comment: socially   . Drug use: Never  . Sexual activity: Yes  Other Topics Concern  . Not on file  Social History Narrative   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   Has a beach house    Social Determinants of Health   Financial Resource Strain: Low Risk   .  Difficulty of Paying Living Expenses: Not hard at all  Food Insecurity: No Food Insecurity  . Worried About Charity fundraiser in the Last Year: Never true  . Ran Out of Food in the Last Year: Never true  Transportation Needs: No Transportation Needs  . Lack of Transportation (Medical): No  . Lack of Transportation (Non-Medical): No  Physical Activity:   . Days of Exercise per Week: Not on file  . Minutes of Exercise per Session: Not on file  Stress: No Stress Concern Present  . Feeling of Stress : Not at all  Social Connections: Unknown  . Frequency of Communication with Friends and Family: Not on file  . Frequency of Social Gatherings with Friends and Family: Not on file  . Attends Religious Services: Not on file  . Active Member of Clubs or Organizations: Not on file  . Attends Archivist Meetings: Not on file  . Marital Status: Married    Tobacco Counseling Counseling given: Not Answered   Clinical Intake:  Pre-visit preparation completed: Yes        Diabetes: Yes (Followed by pcp)  How often do you need to have someone help you when you read instructions, pamphlets, or other written materials from your doctor or pharmacy?: 1 - Never   Interpreter Needed?: No      Activities of Daily Living In your present state of health, do you have any difficulty performing the following activities:  10/10/2019 03/28/2019  Hearing? N N  Vision? N N  Difficulty concentrating or making decisions? N N  Walking or climbing stairs? N N  Dressing or bathing? N N  Doing errands, shopping? N -  Preparing Food and eating ? N -  Using the Toilet? N -  In the past six months, have you accidently leaked urine? N -  Do you have problems with loss of bowel control? N -  Managing your Medications? N -  Managing your Finances? N -  Housekeeping or managing your Housekeeping? N -  Some recent data might be hidden    Patient Care Team: McLean-Scocuzza, Nino Glow, MD as PCP - General (Internal Medicine) Kyung Rudd, MD as Consulting Physician (Radiation Oncology) Truitt Merle, MD as Consulting Physician (Hematology) Donnie Mesa, MD as Consulting Physician (General Surgery) Mauro Kaufmann, RN as Oncology Nurse Navigator Rockwell Germany, RN as Oncology Nurse Navigator Alla Feeling, NP as Nurse Practitioner (Nurse Practitioner)  Indicate any recent Medical Services you may have received from other than [facility] providers in the past year (date may be approximate).     Assessment:   This is a routine wellness examination for Cheryl Knight.  I connected with Teena today by telephone and verified that I am speaking with the correct person using two identifiers. Location patient: home Location provider: work Persons participating in the virtual visit: patient, Marine scientist.    I discussed the limitations, risks, security and privacy concerns of performing an evaluation and management service by telephone and the availability of in person appointments. The patient expressed understanding and verbally consented to this telephonic visit.    Interactive audio and video telecommunications were attempted between this provider and patient, however failed, due to patient having technical difficulties OR patient did not have access to video capability.  We continued and completed visit with audio only.  Some vital signs  may be absent or patient reported.   Hearing/Vision screen  Hearing Screening   125Hz  250Hz  500Hz  1000Hz  2000Hz  3000Hz  4000Hz  6000Hz  8000Hz   Right ear:           Left ear:           Comments: Patient is able to hear conversational tones without difficulty.  No issues reported.  Vision Screening Comments: Cataract extraction, bilateral Visual acuity not assessed, virtual visit.  They have seen their ophthalmologist in the last 12 months.     Dietary issues and exercise activities discussed: Current Exercise Habits: Home exercise routine, Intensity: Mild  Goals    . Follow up with Primary Care Provider      Depression Screen [facility] 2/9 Scores 10/10/2019 08/28/2019 08/16/2019 04/27/2019 02/08/2019 08/23/2018 07/03/2018  PHQ - 2 Score 0 0 0 0 0 0 0  PHQ- 9 Score - - - - - 0 0    Fall Risk Fall Risk  10/10/2019 09/28/2019 08/28/2019 08/16/2019 06/06/2019  Falls in the past year? 0 0 0 0 0  Number falls in past yr: 0 0 0 - 0  Injury with Fall? - 0 0 0 0  Risk for fall due to : - History of fall(s) - - -  Follow up Falls evaluation completed Falls evaluation completed Falls evaluation completed Falls evaluation completed Falls evaluation completed   Handrails in use when climbing stairs? Yes Home free of loose throw rugs in walkways, pet beds, electrical cords, etc? Yes  Adequate lighting in your home to reduce risk of falls? Yes   ASSISTIVE DEVICES UTILIZED TO PREVENT FALLS: Use of a cane, walker or w/c? No   TIMED UP AND GO:  Was the test performed? No . Virtual visit.   Cognitive Function:  Patient is alert and oriented x3.  Denies difficulty focusing, making decisions, memory loss.  Enjoys brain challenging games and activities.     Immunizations Immunization History  Administered Date(s) Administered  . H1N1 02/27/2008  . Influenza-Unspecified 11/18/2017  . PFIZER SARS-COV-2 Vaccination 03/02/2019, 03/29/2019  . Pneumococcal Conjugate-13 10/03/2019  . Td 01/19/2004  . Tdap  12/17/2014    Health Maintenance Health Maintenance  Topic Date Due  . INFLUENZA VACCINE  08/19/2019  . HEMOGLOBIN A1C  03/06/2020  . OPHTHALMOLOGY EXAM  07/15/2020  . FOOT EXAM  08/27/2020  . PNA vac Low Risk Adult (2 of 2 - PPSV23) 10/02/2020  . MAMMOGRAM  03/13/2021  . TETANUS/TDAP  12/16/2024  . COLONOSCOPY  08/13/2029  . DEXA SCAN  Completed  . COVID-19 Vaccine  Completed  . Hepatitis C Screening  Completed    Dental Screening: Recommended annual dental exams for proper oral hygiene.  Community Resource Referral / Chronic Care Management: CRR required this visit?  No   CCM required this visit?  No      Plan:   Keep all routine maintenance appointments.   Follow up 02/28/20 @ 9:30  Nurse note: Patient has not yet received Januvia. Awaiting contact/delivery from mail in pharmacy service.   I have personally reviewed and noted the following in the patient's chart:   . Medical and social history . Use of alcohol, tobacco or illicit drugs  . Current medications and supplements . Functional ability and status . Nutritional status . Physical activity . Advanced directives . List of other physicians . Hospitalizations, surgeries, and ER visits in previous 12 months . Vitals . Screenings to include cognitive, depression, and falls . Referrals and appointments  In addition, I have reviewed and discussed with patient certain preventive protocols, quality metrics, and best practice recommendations. A written personalized care plan for preventive services as well  as general preventive health recommendations were provided to patient via mychart.     Varney Biles, LPN   01/29/1622

## 2019-10-10 NOTE — Telephone Encounter (Signed)
I have not received appointment info from pre admit testing for coivd test or pre admit visit.  I have contacted pre admit and requested that these appointments be scheduled. Appointment confirmation will be sent tour office via fax.

## 2019-10-11 ENCOUNTER — Encounter: Payer: Self-pay | Admitting: Urgent Care

## 2019-10-11 ENCOUNTER — Other Ambulatory Visit: Payer: PPO

## 2019-10-11 ENCOUNTER — Other Ambulatory Visit: Payer: Self-pay

## 2019-10-11 ENCOUNTER — Encounter
Admission: RE | Admit: 2019-10-11 | Discharge: 2019-10-11 | Disposition: A | Discharge status: Home / Self Care | Payer: PPO | Source: Ambulatory Visit | Attending: Pulmonary Disease | Admitting: Pulmonary Disease

## 2019-10-11 HISTORY — DX: Personal history of other diseases of the digestive system: Z87.19

## 2019-10-11 HISTORY — DX: Dyspnea, unspecified: R06.00

## 2019-10-11 HISTORY — DX: Failed or difficult intubation, initial encounter: T88.4XXA

## 2019-10-11 NOTE — Telephone Encounter (Signed)
Clarified with pre admit testing that covid test should tomorrow 10/12/2019. Patient has been scheduled for covid test 10/12/2019 between 8-1. Patient is aware and voiced her understanding.  Nothing further needed.

## 2019-10-11 NOTE — Patient Instructions (Signed)
Your procedure is scheduled on: 10/15/19 Report to [facility]. To find out your arrival time please call [phone] between 1PM - 3PM on 10/12/19.  Remember: Instructions that are not followed completely may result in serious medical risk, up to and including death, or upon the discretion of your surgeon and anesthesiologist your surgery may need to be rescheduled.     _X__ 1. Do not eat food after midnight the night before your procedure.                 No gum chewing or hard candies. You may drink clear liquids up to 2 hours                 before you are scheduled to arrive for your surgery- DO not drink clear                 liquids within 2 hours of the start of your surgery.                 Clear Liquids include:  water, apple juice without pulp, clear carbohydrate                 drink such as Clearfast or Gatorade, Black Coffee or Tea (Do not add                 anything to coffee or tea). Diabetics water only  __X__2.  On the morning of surgery brush your teeth with toothpaste and water, you                 may rinse your mouth with mouthwash if you wish.  Do not swallow any              toothpaste of mouthwash.     _X__ 3.  No Alcohol for 24 hours before or after surgery.   _X__ 4.  Do Not Smoke or use e-cigarettes For 24 Hours Prior to Your Surgery.                 Do not use any chewable tobacco products for at least 6 hours prior to                 surgery.  ____  5.  Bring all medications with you on the day of surgery if instructed.   __X__  6.  Notify your doctor if there is any change in your medical condition      (cold, fever, infections).     Do not wear jewelry, make-up, hairpins, clips or nail polish. Do not wear lotions, powders, or perfumes.  Do not shave 48 hours prior to surgery. Men may shave face and neck. Do not bring valuables to the hospital.    [facility] is not responsible for any belongings or  valuables.  Contacts, dentures/partials or body piercings may not be worn into surgery. Bring a case for your contacts, glasses or hearing aids, a denture cup will be supplied. Leave your suitcase in the car. After surgery it may be brought to your room. For patients admitted to the hospital, discharge time is determined by your treatment team.   Patients discharged the day of surgery will not be allowed to drive home.   Please read over the following fact sheets that you were given:   MRSA Information  __X__ Take these medicines the morning of surgery with A SIP OF WATER:  1. topiramate (TOPAMAX) 100 MG tablet  2. loratadine (CLARITIN) 10 MG tablet  3. levothyroxine (SYNTHROID) 100 MCG tablet  4. Guaifenesin 1200 MG TB12  5. Dexlansoprazole (DEXILANT) 30 MG capsule  6. azelastine (ASTELIN) 0.1 % nasal spray  ____ Fleet Enema (as directed)   ____ Use CHG Soap/SAGE wipes as directed  __X__ Use inhalers on the day of surgery  ____ Stop metformin/Janumet/Farxiga 2 days prior to surgery    ____ Take 1/2 of usual insulin dose the night before surgery. No insulin the morning          of surgery.   ____ Stop Blood Thinners Coumadin/Plavix/Xarelto/Pleta/Pradaxa/Eliquis/Effient/Aspirin  on   Or contact your Surgeon, Cardiologist or Medical Doctor regarding  ability to stop your blood thinners  __X__ Stop Anti-inflammatories 7 days before surgery such as Advil, Ibuprofen, Motrin,  BC or Goodies Powder, Naprosyn, Naproxen, Aleve, Aspirin    __X__ Stop all herbal supplements, fish oil or vitamin E until after surgery.    ____ Bring C-Pap to the hospital.

## 2019-10-12 ENCOUNTER — Other Ambulatory Visit
Admission: RE | Admit: 2019-10-12 | Discharge: 2019-10-12 | Disposition: A | Discharge status: Home / Self Care | Payer: PPO | Source: Ambulatory Visit | Attending: Pulmonary Disease | Admitting: Pulmonary Disease

## 2019-10-12 DIAGNOSIS — Z01812 Encounter for preprocedural laboratory examination: Secondary | ICD-10-CM | POA: Insufficient documentation

## 2019-10-12 DIAGNOSIS — Z20822 Contact with and (suspected) exposure to covid-19: Secondary | ICD-10-CM | POA: Insufficient documentation

## 2019-10-12 LAB — THIOPURINE METHYLTRANSFERASE (TPMT), RBC: Thiopurine Methyltransferase, RBC: 16 nmol/hr/mL RBC

## 2019-10-13 LAB — SARS CORONAVIRUS 2 (TAT 6-24 HRS): SARS Coronavirus 2: NEGATIVE

## 2019-10-14 DIAGNOSIS — R300 Dysuria: Secondary | ICD-10-CM | POA: Diagnosis not present

## 2019-10-14 NOTE — Progress Notes (Signed)
TPMT normal. Holding any change in treatment until pulmonary work up is complete.

## 2019-10-15 ENCOUNTER — Encounter: Admission: RE | Payer: Self-pay | Source: Home / Self Care

## 2019-10-15 ENCOUNTER — Telehealth: Payer: Self-pay | Admitting: Pulmonary Disease

## 2019-10-15 ENCOUNTER — Ambulatory Visit: Admission: RE | Admit: 2019-10-15 | Payer: PPO | Source: Home / Self Care | Admitting: Pulmonary Disease

## 2019-10-15 SURGERY — BRONCHOSCOPY, WITH FLUOROSCOPY
Anesthesia: General

## 2019-10-15 MED ORDER — SODIUM CHLORIDE 0.9 % IV SOLN: Freq: Once | INTRAVENOUS | Status: DC

## 2019-10-15 MED ORDER — ORAL CARE MOUTH RINSE: 15.0000 mL | Freq: Once | OROMUCOSAL | Status: DC

## 2019-10-15 MED ORDER — SODIUM CHLORIDE 0.9 % IV SOLN: INTRAVENOUS | Status: DC

## 2019-10-15 MED ORDER — CHLORHEXIDINE GLUCONATE 0.12 % MT SOLN: 15.0000 mL | Freq: Once | OROMUCOSAL | Status: DC

## 2019-10-15 MED ORDER — BUTAMBEN-TETRACAINE-BENZOCAINE 2-2-14 % EX AERO
1.0000 | INHALATION_SPRAY | Freq: Once | CUTANEOUS | Status: DC
  Filled 2019-10-15: qty 20

## 2019-10-15 NOTE — Telephone Encounter (Signed)
Called and spoke to patient.  Patient would like to reschedule bronch for today due to UTI and swollen knee.  Will with OR has been made aware of cancellation.  Dr. Patsey Berthold has been notified via telephone.  Patient is aware that our office will contact her to reschedule once Dr. Patsey Berthold Is back in office and can look at her schedule.

## 2019-10-22 NOTE — Telephone Encounter (Signed)
Spoke to patient, who stated that she received a notification on mychart to reschedule bronch.  Patient stated that she is still on abx for bladder infection.   Dr. Patsey Berthold, please advise.

## 2019-10-22 NOTE — Telephone Encounter (Signed)
Pt states she received a msg in mychart to call and reschedule bronch. Please advise.

## 2019-10-22 NOTE — Telephone Encounter (Signed)
Patient is aware of below message and voiced her understanding.  She will call back once she is completed with abx and bladder infection has cleared.  Nothing further needed at this time.

## 2019-10-22 NOTE — Telephone Encounter (Signed)
This should not need to be done in a hurry.  I would recommend she get over her infection first.  She can let us know when everything is clear and and then we can reschedule.  Tell her that the stuff on my chart just automatically populate's.

## 2019-10-24 DIAGNOSIS — H35033 Hypertensive retinopathy, bilateral: Secondary | ICD-10-CM | POA: Diagnosis not present

## 2019-10-24 DIAGNOSIS — H40013 Open angle with borderline findings, low risk, bilateral: Secondary | ICD-10-CM | POA: Diagnosis not present

## 2019-10-24 DIAGNOSIS — Z961 Presence of intraocular lens: Secondary | ICD-10-CM | POA: Diagnosis not present

## 2019-10-24 DIAGNOSIS — H04123 Dry eye syndrome of bilateral lacrimal glands: Secondary | ICD-10-CM | POA: Diagnosis not present

## 2019-10-24 LAB — HM DIABETES EYE EXAM

## 2019-10-29 ENCOUNTER — Emergency Department (HOSPITAL_COMMUNITY): Payer: PPO

## 2019-10-29 ENCOUNTER — Encounter (HOSPITAL_COMMUNITY): Payer: Self-pay | Admitting: Emergency Medicine

## 2019-10-29 ENCOUNTER — Other Ambulatory Visit: Payer: Self-pay

## 2019-10-29 DIAGNOSIS — R1013 Epigastric pain: Secondary | ICD-10-CM | POA: Diagnosis not present

## 2019-10-29 DIAGNOSIS — E039 Hypothyroidism, unspecified: Secondary | ICD-10-CM | POA: Insufficient documentation

## 2019-10-29 DIAGNOSIS — Z87891 Personal history of nicotine dependence: Secondary | ICD-10-CM | POA: Insufficient documentation

## 2019-10-29 DIAGNOSIS — J4541 Moderate persistent asthma with (acute) exacerbation: Secondary | ICD-10-CM | POA: Diagnosis not present

## 2019-10-29 DIAGNOSIS — R072 Precordial pain: Secondary | ICD-10-CM | POA: Diagnosis not present

## 2019-10-29 DIAGNOSIS — I10 Essential (primary) hypertension: Secondary | ICD-10-CM | POA: Insufficient documentation

## 2019-10-29 DIAGNOSIS — Z79899 Other long term (current) drug therapy: Secondary | ICD-10-CM | POA: Insufficient documentation

## 2019-10-29 DIAGNOSIS — R079 Chest pain, unspecified: Secondary | ICD-10-CM | POA: Diagnosis not present

## 2019-10-29 DIAGNOSIS — E1165 Type 2 diabetes mellitus with hyperglycemia: Secondary | ICD-10-CM | POA: Diagnosis not present

## 2019-10-29 DIAGNOSIS — R000 Tachycardia, unspecified: Secondary | ICD-10-CM | POA: Diagnosis not present

## 2019-10-29 LAB — CBC
HCT: 40.7 % (ref 36.0–46.0)
Hemoglobin: 13.5 g/dL (ref 12.0–15.0)
MCH: 31.1 pg (ref 26.0–34.0)
MCHC: 33.2 g/dL (ref 30.0–36.0)
MCV: 93.8 fL (ref 80.0–100.0)
Platelets: 292 10*3/uL (ref 150–400)
RBC: 4.34 MIL/uL (ref 3.87–5.11)
RDW: 13.1 % (ref 11.5–15.5)
WBC: 11.3 10*3/uL — ABNORMAL HIGH (ref 4.0–10.5)
nRBC: 0 % (ref 0.0–0.2)

## 2019-10-29 LAB — BASIC METABOLIC PANEL
Anion gap: 12 (ref 5–15)
BUN: 11 mg/dL (ref 8–23)
CO2: 24 mmol/L (ref 22–32)
Calcium: 9 mg/dL (ref 8.9–10.3)
Chloride: 101 mmol/L (ref 98–111)
Creatinine, Ser: 0.59 mg/dL (ref 0.44–1.00)
GFR, Estimated: >60 mL/min (ref >60)
Glucose, Bld: 160 mg/dL — ABNORMAL HIGH (ref 70–99)
Potassium: 3.4 mmol/L — ABNORMAL LOW (ref 3.5–5.1)
Sodium: 137 mmol/L (ref 135–145)

## 2019-10-29 LAB — TROPONIN I (HIGH SENSITIVITY): Troponin I (High Sensitivity): <2 ng/L (ref <18)

## 2019-10-29 NOTE — ED Triage Notes (Signed)
Pt c/o pain between shoulder blades that radiates to central chest.

## 2019-10-30 ENCOUNTER — Emergency Department (HOSPITAL_COMMUNITY)
Admission: EM | Admit: 2019-10-30 | Discharge: 2019-10-30 | Disposition: A | Discharge status: Home / Self Care | Payer: PPO | Attending: Emergency Medicine | Admitting: Emergency Medicine

## 2019-10-30 ENCOUNTER — Telehealth: Payer: Self-pay

## 2019-10-30 DIAGNOSIS — R1013 Epigastric pain: Secondary | ICD-10-CM

## 2019-10-30 LAB — HEPATIC FUNCTION PANEL
ALT: 14 U/L (ref 0–44)
AST: 21 U/L (ref 15–41)
Albumin: 3.9 g/dL (ref 3.5–5.0)
Alkaline Phosphatase: 32 U/L — ABNORMAL LOW (ref 38–126)
Bilirubin, Direct: 0.1 mg/dL (ref 0.0–0.2)
Indirect Bilirubin: 0.4 mg/dL (ref 0.3–0.9)
Total Bilirubin: 0.5 mg/dL (ref 0.3–1.2)
Total Protein: 7.3 g/dL (ref 6.5–8.1)

## 2019-10-30 LAB — LIPASE, BLOOD: Lipase: 27 U/L (ref 11–51)

## 2019-10-30 LAB — TROPONIN I (HIGH SENSITIVITY): Troponin I (High Sensitivity): <2 ng/L (ref <18)

## 2019-10-30 MED ORDER — LIDOCAINE VISCOUS HCL 2 % MT SOLN
15.0000 mL | Freq: Once | OROMUCOSAL | Status: AC
  Administered 2019-10-30: 15 mL via OROMUCOSAL
  Filled 2019-10-30: qty 15

## 2019-10-30 MED ORDER — ALUM & MAG HYDROXIDE-SIMETH 200-200-20 MG/5ML PO SUSP
30.0000 mL | Freq: Once | ORAL | Status: AC
  Administered 2019-10-30: 30 mL via ORAL
  Filled 2019-10-30: qty 30

## 2019-10-30 MED ORDER — LIDOCAINE VISCOUS HCL 2 % MT SOLN: 15.0000 mL | OROMUCOSAL | 0 refills | Status: DC | PRN

## 2019-10-30 MED ORDER — FAMOTIDINE 20 MG PO TABS
20.0000 mg | ORAL_TABLET | Freq: Once | ORAL | Status: AC
  Administered 2019-10-30: 20 mg via ORAL
  Filled 2019-10-30: qty 1

## 2019-10-30 NOTE — ED Provider Notes (Signed)
[facility] EMERGENCY DEPARTMENT Provider Note   CSN: [id number] Arrival date & time: 10/29/19  2146     History Chief Complaint  Patient presents with  . Chest Pain    Cheryl Knight is a 64 y.o. female.   Chest Pain Pain location:  Epigastric and substernal area Pain quality: sharp and shooting   Pain radiates to:  Does not radiate Pain severity:  Mild Duration:  3 days Timing:  Constant Progression:  Worsening Chronicity:  New Context: eating   Relieved by:  None tried Worsened by:  Nothing Ineffective treatments:  None tried Associated symptoms: no abdominal pain        Past Medical History:  Diagnosis Date  . Cancer ([place]) 02/2019   right breast IDC  . Cataracts, bilateral    tbd surgery 2021  . Complication of anesthesia   . Cough   . Difficult intubation    TRACH AT 19  . Dyspnea    W/ COUGHING  . Family history of breast cancer   . Family history of lung cancer   . Family history of prostate cancer   . Family history of skin cancer   . GERD (gastroesophageal reflux disease)   . Headache   . High cholesterol   . History of hiatal hernia   . Hypertension   . Hypothyroidism   . Irritable bowel   . Osteoarthritis    i.e right knee   . PONV (postoperative nausea and vomiting)   . Pre-diabetes   . Rheumatoid arthritis ([place])   . Thyroid disease     Patient Active Problem List   Diagnosis Date Noted  . Pulmonary fibrosis ([place]) 10/01/2019  . Pulmonary nodules 10/01/2019  . Bronchiectasis without complication ([place]) 29/47/6546  . Aortic atherosclerosis ([place]) 10/01/2019  . Hypertension associated with diabetes ([place]) 10/01/2019  . Paresthesia of upper extremity 10/01/2019  . Hiatal hernia 08/16/2019  . Esophagitis 08/16/2019  . Persistent cough 08/16/2019  . Dry eye syndrome 08/05/2019  . Glaucoma 08/05/2019  . Hypertensive retinopathy 08/05/2019  . Arteriosclerosis 08/05/2019  . Pterygium 08/05/2019  . Ocular rosacea 08/05/2019  .  Genetic testing 05/02/2019  . Family history of breast cancer   . Family history of prostate cancer   . Family history of skin cancer   . Family history of lung cancer   . Malignant neoplasm of lower-outer quadrant of right breast of female, estrogen receptor positive ([place]) 03/19/2019  . Type 2 diabetes mellitus with hyperglycemia, without long-term current use of insulin ([place]) 02/08/2019  . Allergic rhinitis 02/08/2019  . Osteoarthritis   . Primary osteoarthritis involving multiple joints 11/15/2017  . Moderate asthma with acute exacerbation 05/03/2017  . Routine general medical examination at a health care facility 02/08/2017  . Primary osteoarthritis of both feet 02/10/2016  . Hx of migraine headaches 02/07/2016  . History of hypothyroidism 02/07/2016  . History of gastroesophageal reflux (GERD) 02/07/2016  . Obesity (BMI 30-39.9) 12/30/2015  . Basal cell carcinoma of chest wall 10/05/2011  . Hypertension 09/01/2010  . Hyperlipidemia 02/27/2008  . Hypothyroidism 02/21/2007  . Migraine 02/21/2007  . VAGINITIS, ATROPHIC 02/21/2007  . Psoriasis 02/21/2007  . Sicca syndrome, unspecified 02/21/2007  . GERD 06/29/2006  . Rheumatoid arthritis ([place]) 06/29/2006    Past Surgical History:  Procedure Laterality Date  . ABDOMINAL HYSTERECTOMY     age 28 ovaries out  . BREAST LUMPECTOMY WITH RADIOACTIVE SEED AND SENTINEL LYMPH NODE BIOPSY Right 03/28/2019   Procedure: RIGHT BREAST LUMPECTOMY WITH RADIOACTIVE  SEED AND SENTINEL LYMPH NODE BIOPSY;  Surgeon: Donnie Mesa, MD;  Location: [place];  Service: General;  Laterality: Right;  PEC BLOCK FOR POST OP PAIN  . BREAST SURGERY    . CATARACT EXTRACTION, BILATERAL    . ESOPHAGEAL DILATION     Dr. Olevia Perches 15 years from 2021  . TONSILLECTOMY    . TUBAL LIGATION       OB History   No obstetric history on file.     Family History  Problem Relation Age of Onset  . Hypertension Mother   . Thyroid disease Mother   .  Breast cancer Mother 59       breast dx age 74 died age 57   . Prostate cancer Father 33       metastatic  . Stroke Maternal Grandmother   . Skin cancer Maternal Uncle        dx. in his 9s  . Lung cancer Maternal Uncle        smoker, died age 15    Social History   Tobacco Use  . Smoking status: Former Smoker    Packs/day: 0.25    Years: 7.00    Pack years: 1.75    Types: Cigarettes    Quit date: 2000    Years since quitting: 21.7  . Smokeless tobacco: Never Used  Vaping Use  . Vaping Use: Never used  Substance Use Topics  . Alcohol use: Yes    Comment: socially   . Drug use: Never    Home Medications Prior to Admission medications   Medication Sig Start Date End Date Taking? Authorizing Provider  aspirin-acetaminophen-caffeine (EXCEDRIN MIGRAINE) [phone] MG tablet Take 2 tablets by mouth every 6 (six) hours as needed for headache.    [provider]  azelastine (ASTELIN) 0.1 % nasal spray Place 1 spray into both nostrils 2 (two) times daily. 08/06/19   Tyler Pita, MD  betamethasone dipropionate (DIPROLENE) 0.05 % cream Apply topically 2 (two) times daily. Patient taking differently: Apply 1 application topically 2 (two) times daily as needed (psoriasis / eczema).  01/04/14   Dorena Cookey, MD  Calcium Carbonate Antacid (TUMS CHEWY BITES PO) Take 1 tablet by mouth at bedtime as needed (acid reflux).    [provider]  Calcium Carbonate-Vitamin D (CALCIUM PLUS VITAMIN D PO) Take 1 tablet by mouth daily.     [provider]  cyclobenzaprine (FLEXERIL) 10 MG tablet Take 1 tablet (10 mg total) by mouth daily. Patient taking differently: Take 10 mg by mouth at bedtime.  08/28/19   McLean-Scocuzza, Nino Glow, MD  Dexlansoprazole (DEXILANT) 30 MG capsule Take 1 capsule (30 mg total) by mouth daily. 09/20/19   Martyn Ehrich, NP  famotidine-calcium carbonate-magnesium hydroxide (PEPCID COMPLETE) 10-800-165 MG chewable tablet Chew 1 tablet by  mouth daily as needed (acid reflux).    [provider]  glucosamine-chondroitin 500-400 MG tablet Take 2 tablets by mouth daily.     [provider]  Guaifenesin 1200 MG TB12 Take 1,200 mg by mouth in the morning and at bedtime.     [provider]  HYDROcodone-acetaminophen (NORCO) 7.5-325 MG tablet Take 1 tablet by mouth every 6 (six) hours as needed for moderate pain.    [provider]  hydrocortisone cream 1 % Apply 1 application topically daily as needed (rash).     [provider]  leflunomide (ARAVA) 20 MG tablet TAKE 1 TABLET(20 MG) BY MOUTH DAILY Patient taking  differently: Take 20 mg by mouth daily.  09/26/19   Ofilia Neas, PA-C  levothyroxine (SYNTHROID) 100 MCG tablet Daily 30 minutes before food Patient taking differently: Take 100 mcg by mouth daily before breakfast.  02/08/19   McLean-Scocuzza, Nino Glow, MD  lidocaine (XYLOCAINE) 2 % solution Use as directed 15 mLs in the mouth or throat as needed for mouth pain. 10/30/19   [place]oyte Ziebell, Corene Cornea, MD  loratadine (CLARITIN) 10 MG tablet Take 10 mg by mouth every other day.     [provider]  loratadine-pseudoephedrine (CLARITIN-D 24-HOUR) 10-240 MG 24 hr tablet Take 1 tablet by mouth every other day.    [provider]  Multiple Vitamin (MULTIVITAMIN WITH MINERALS) TABS tablet Take 1 tablet by mouth daily.    [provider]  olmesartan-hydrochlorothiazide (BENICAR HCT) 20-12.5 MG tablet Take 1 tablet by mouth daily. In am 02/08/19   McLean-Scocuzza, Nino Glow, MD  pravastatin (PRAVACHOL) 20 MG tablet Take 1 tablet (20 mg total) by mouth at bedtime. 08/16/19   McLean-Scocuzza, Nino Glow, MD  promethazine (PHENERGAN) 25 MG tablet Take 1 tablet (25 mg total) by mouth 2 (two) times daily as needed. Patient taking differently: Take 25 mg by mouth 2 (two) times daily as needed for nausea or vomiting.  08/28/19   McLean-Scocuzza, Nino Glow, MD  RESTASIS 0.05 % ophthalmic emulsion Place  1 drop into both eyes 2 (two) times daily.  01/30/16   [provider]  sitaGLIPtin (JANUVIA) 25 MG tablet Take 1 tablet (25 mg total) by mouth daily. 09/28/19   McLean-Scocuzza, Nino Glow, MD  SUMAtriptan (IMITREX) 50 MG tablet TAKE 1 TABLET BY MOUTH AS NEEDED FOR HEADACHE Patient taking differently: Take 50 mg by mouth every 2 (two) hours as needed for migraine.  02/08/19   McLean-Scocuzza, Nino Glow, MD  tamoxifen (NOLVADEX) 20 MG tablet Take 1 tablet (20 mg total) by mouth daily. 06/04/19   Truitt Merle, MD  Tiotropium Bromide Monohydrate (SPIRIVA RESPIMAT) 1.25 MCG/ACT AERS Inhale 2 puffs into the lungs daily. 10/08/19   Tyler Pita, MD  topiramate (TOPAMAX) 100 MG tablet Take 1 tablet (100 mg total) by mouth daily. 02/08/19   McLean-Scocuzza, Nino Glow, MD    Allergies    Demerol [meperidine], Lisinopril, and Pentazocine lactate  Review of Systems   Review of Systems  Cardiovascular: Positive for chest pain.  Gastrointestinal: Negative for abdominal pain.  All other systems reviewed and are negative.   Physical Exam Updated Vital Signs BP 126/81   Pulse (!) 109   Temp 98.1 F (36.7 C)   Resp (!) 23   Ht 5\' 8"  (1.727 m)   Wt 92.1 kg   SpO2 98%   BMI 30.87 kg/m   Physical Exam Vitals and nursing note reviewed.  Constitutional:      Appearance: She is well-developed.  HENT:     Head: Normocephalic and atraumatic.     Mouth/Throat:     Mouth: Mucous membranes are moist.     Pharynx: Oropharynx is clear.  Eyes:     Pupils: Pupils are equal, round, and reactive to light.  Cardiovascular:     Rate and Rhythm: Normal rate and regular rhythm.  Pulmonary:     Effort: No respiratory distress.     Breath sounds: No stridor.  Abdominal:     General: There is no distension.  Musculoskeletal:        General: No swelling or tenderness. Normal range of motion.     Cervical  back: Normal range of motion.  Skin:    General: Skin is warm and dry.  Neurological:     General:  No focal deficit present.     Mental Status: She is alert.     ED Results / Procedures / Treatments   Labs (all labs ordered are listed, but only abnormal results are displayed) Labs Reviewed  BASIC METABOLIC PANEL - Abnormal; Notable for the following components:      Result Value   Potassium 3.4 (*)    Glucose, Bld 160 (*)    All other components within normal limits  CBC - Abnormal; Notable for the following components:   WBC 11.3 (*)    All other components within normal limits  HEPATIC FUNCTION PANEL - Abnormal; Notable for the following components:   Alkaline Phosphatase 32 (*)    All other components within normal limits  LIPASE, BLOOD  TROPONIN I (HIGH SENSITIVITY)  TROPONIN I (HIGH SENSITIVITY)    EKG EKG Interpretation  Date/Time:  Monday October 29 2019 21:49:53 EDT Ventricular Rate:  102 PR Interval:  170 QRS Duration: 76 QT Interval:  360 QTC Calculation: 469 R Axis:   -22 Text Interpretation: Sinus tachycardia Low voltage QRS Cannot rule out Anteroseptal infarct , age undetermined Abnormal ECG No significant change since last tracing Confirmed by Fredia Sorrow [phone]) on 10/29/2019 10:02:43 PM   Radiology DG Chest 2 View  Result Date: 10/29/2019 CLINICAL DATA:  Chest pain. patient states pain between her shoulder blades that radiates into the middle of her chest. Hx of breast cancer, HTN, pre-diabetes, and former smoker of 0.25 packs a day x 7 years. Not being tested for covid-19. EXAM: CHEST - 2 VIEW COMPARISON:  Chest x-ray 07/18/2017, CT chest 09/26/2019 FINDINGS: The heart size and mediastinal contours are within normal limits. No focal consolidation. No pulmonary edema. No pleural effusion. No pneumothorax. Retrosternal subcentimeter peripherally calcified lesion likely benign in etiology. No acute osseous abnormality. Multilevel mild degenerative changes of the spine. IMPRESSION: No active cardiopulmonary disease. Electronically Signed   By: Iven Finn M.D.   On: 10/29/2019 22:30    Procedures Procedures (including critical care time)  Medications Ordered in ED Medications  lidocaine (XYLOCAINE) 2 % viscous mouth solution 15 mL (15 mLs Mouth/Throat Given 10/30/19 0155)  alum & mag hydroxide-simeth (MAALOX/MYLANTA) 200-200-20 MG/5ML suspension 30 mL (30 mLs Oral Given 10/30/19 0155)  famotidine (PEPCID) tablet 20 mg (20 mg Oral Given 10/30/19 0323)    ED Course  I have reviewed the triage vital signs and the nursing notes.  Pertinent labs & imaging results that were available during my care of the patient were reviewed by me and considered in my medical decision making (see chart for details).    MDM Rules/Calculators/A&P                          Suspect GI related cause for symptoms such as peptic ulcer, indigestion or other related issues however she does have some risk factors for other abnormalities I think is unlikely to be a pulmonary embolus with her normal vital signs.  Also unlikely be ACS with a reassuring EKG and negative troponins.  Considered aortic dissection since she did have pain above and below however after the GI cocktail her pain improved significantly and she felt like this was similar to her previous episodes of indigestion and defer doing a CT scan this time.  Her vital signs were normal.  Her  pulses were symmetric and she had no neurologic symptoms to make me push for get a CT scan tonight.  She prefers to follow-up with her primary doctor tomorrow for further management. Her and husband in agreement.   Final Clinical Impression(s) / ED Diagnoses Final diagnoses:  Epigastric pain    Rx / DC Orders ED Discharge Orders         Ordered    lidocaine (XYLOCAINE) 2 % solution  As needed        10/30/19 0309           Eliyohu Class, Corene Cornea, MD 10/30/19 [phone]

## 2019-10-30 NOTE — Telephone Encounter (Signed)
Pt was went to ED last night and pt wants Dr. Olivia Mackie to look over notes from hospital and see if she wanted to order anymore test? She said she still has some discomfort in chest but has improved.

## 2019-10-30 NOTE — Telephone Encounter (Signed)
Please advise 

## 2019-11-01 ENCOUNTER — Encounter: Payer: Self-pay | Admitting: Emergency Medicine

## 2019-11-01 ENCOUNTER — Emergency Department: Payer: PPO

## 2019-11-01 ENCOUNTER — Ambulatory Visit
Admission: EM | Admit: 2019-11-01 | Discharge: 2019-11-01 | Disposition: A | Discharge status: Home / Self Care | Payer: PPO

## 2019-11-01 ENCOUNTER — Telehealth: Payer: Self-pay | Admitting: Internal Medicine

## 2019-11-01 ENCOUNTER — Encounter: Payer: Self-pay | Admitting: *Deleted

## 2019-11-01 ENCOUNTER — Other Ambulatory Visit: Payer: Self-pay

## 2019-11-01 ENCOUNTER — Inpatient Hospital Stay
Admission: EM | Admit: 2019-11-01 | Discharge: 2019-11-03 | DRG: 419 | Disposition: A | Discharge status: Home / Self Care | Payer: PPO | Attending: Surgery | Admitting: Surgery

## 2019-11-01 DIAGNOSIS — E039 Hypothyroidism, unspecified: Secondary | ICD-10-CM | POA: Diagnosis not present

## 2019-11-01 DIAGNOSIS — I10 Essential (primary) hypertension: Secondary | ICD-10-CM | POA: Diagnosis not present

## 2019-11-01 DIAGNOSIS — Z7984 Long term (current) use of oral hypoglycemic drugs: Secondary | ICD-10-CM

## 2019-11-01 DIAGNOSIS — Z8349 Family history of other endocrine, nutritional and metabolic diseases: Secondary | ICD-10-CM | POA: Diagnosis not present

## 2019-11-01 DIAGNOSIS — Z20822 Contact with and (suspected) exposure to covid-19: Secondary | ICD-10-CM | POA: Diagnosis present

## 2019-11-01 DIAGNOSIS — Z90710 Acquired absence of both cervix and uterus: Secondary | ICD-10-CM

## 2019-11-01 DIAGNOSIS — Z79899 Other long term (current) drug therapy: Secondary | ICD-10-CM

## 2019-11-01 DIAGNOSIS — R1011 Right upper quadrant pain: Secondary | ICD-10-CM

## 2019-11-01 DIAGNOSIS — K219 Gastro-esophageal reflux disease without esophagitis: Secondary | ICD-10-CM | POA: Diagnosis not present

## 2019-11-01 DIAGNOSIS — Z8669 Personal history of other diseases of the nervous system and sense organs: Secondary | ICD-10-CM

## 2019-11-01 DIAGNOSIS — Z8042 Family history of malignant neoplasm of prostate: Secondary | ICD-10-CM | POA: Diagnosis not present

## 2019-11-01 DIAGNOSIS — Z9841 Cataract extraction status, right eye: Secondary | ICD-10-CM | POA: Diagnosis not present

## 2019-11-01 DIAGNOSIS — Z7982 Long term (current) use of aspirin: Secondary | ICD-10-CM

## 2019-11-01 DIAGNOSIS — Z808 Family history of malignant neoplasm of other organs or systems: Secondary | ICD-10-CM | POA: Diagnosis not present

## 2019-11-01 DIAGNOSIS — Z8249 Family history of ischemic heart disease and other diseases of the circulatory system: Secondary | ICD-10-CM | POA: Diagnosis not present

## 2019-11-01 DIAGNOSIS — Z90722 Acquired absence of ovaries, bilateral: Secondary | ICD-10-CM

## 2019-11-01 DIAGNOSIS — Z888 Allergy status to other drugs, medicaments and biological substances status: Secondary | ICD-10-CM

## 2019-11-01 DIAGNOSIS — E7800 Pure hypercholesterolemia, unspecified: Secondary | ICD-10-CM | POA: Diagnosis not present

## 2019-11-01 DIAGNOSIS — Z79890 Hormone replacement therapy (postmenopausal): Secondary | ICD-10-CM

## 2019-11-01 DIAGNOSIS — Z803 Family history of malignant neoplasm of breast: Secondary | ICD-10-CM

## 2019-11-01 DIAGNOSIS — Z801 Family history of malignant neoplasm of trachea, bronchus and lung: Secondary | ICD-10-CM | POA: Diagnosis not present

## 2019-11-01 DIAGNOSIS — Z9842 Cataract extraction status, left eye: Secondary | ICD-10-CM | POA: Diagnosis not present

## 2019-11-01 DIAGNOSIS — K82A1 Gangrene of gallbladder in cholecystitis: Secondary | ICD-10-CM | POA: Diagnosis not present

## 2019-11-01 DIAGNOSIS — K8000 Calculus of gallbladder with acute cholecystitis without obstruction: Secondary | ICD-10-CM | POA: Diagnosis not present

## 2019-11-01 DIAGNOSIS — E1165 Type 2 diabetes mellitus with hyperglycemia: Secondary | ICD-10-CM | POA: Diagnosis not present

## 2019-11-01 DIAGNOSIS — K810 Acute cholecystitis: Principal | ICD-10-CM | POA: Diagnosis present

## 2019-11-01 DIAGNOSIS — Z823 Family history of stroke: Secondary | ICD-10-CM

## 2019-11-01 DIAGNOSIS — Z87891 Personal history of nicotine dependence: Secondary | ICD-10-CM

## 2019-11-01 DIAGNOSIS — K7689 Other specified diseases of liver: Secondary | ICD-10-CM | POA: Diagnosis not present

## 2019-11-01 LAB — LACTIC ACID, PLASMA: Lactic Acid, Venous: 1.2 mmol/L (ref 0.5–1.9)

## 2019-11-01 LAB — URINALYSIS, COMPLETE (UACMP) WITH MICROSCOPIC
Glucose, UA: NEGATIVE mg/dL
Hgb urine dipstick: NEGATIVE
Ketones, ur: 5 mg/dL — AB
Leukocytes,Ua: NEGATIVE
Nitrite: NEGATIVE
Protein, ur: >=300 mg/dL — AB
Specific Gravity, Urine: 1.025 (ref 1.005–1.030)
pH: 5 (ref 5.0–8.0)

## 2019-11-01 LAB — CBC
HCT: 40.1 % (ref 36.0–46.0)
Hemoglobin: 13.4 g/dL (ref 12.0–15.0)
MCH: 30.5 pg (ref 26.0–34.0)
MCHC: 33.4 g/dL (ref 30.0–36.0)
MCV: 91.1 fL (ref 80.0–100.0)
Platelets: 318 10*3/uL (ref 150–400)
RBC: 4.4 MIL/uL (ref 3.87–5.11)
RDW: 13 % (ref 11.5–15.5)
WBC: 13.5 10*3/uL — ABNORMAL HIGH (ref 4.0–10.5)
nRBC: 0 % (ref 0.0–0.2)

## 2019-11-01 LAB — RESPIRATORY PANEL BY RT PCR (FLU A&B, COVID)
Influenza A by PCR: NEGATIVE
Influenza B by PCR: NEGATIVE
SARS Coronavirus 2 by RT PCR: NEGATIVE

## 2019-11-01 LAB — COMPREHENSIVE METABOLIC PANEL
ALT: 12 U/L (ref 0–44)
AST: 18 U/L (ref 15–41)
Albumin: 3.7 g/dL (ref 3.5–5.0)
Alkaline Phosphatase: 41 U/L (ref 38–126)
Anion gap: 12 (ref 5–15)
BUN: 14 mg/dL (ref 8–23)
CO2: 22 mmol/L (ref 22–32)
Calcium: 9.1 mg/dL (ref 8.9–10.3)
Chloride: 99 mmol/L (ref 98–111)
Creatinine, Ser: 0.99 mg/dL (ref 0.44–1.00)
GFR, Estimated: 59 mL/min — ABNORMAL LOW (ref >60)
Glucose, Bld: 124 mg/dL — ABNORMAL HIGH (ref 70–99)
Potassium: 3.3 mmol/L — ABNORMAL LOW (ref 3.5–5.1)
Sodium: 133 mmol/L — ABNORMAL LOW (ref 135–145)
Total Bilirubin: 0.9 mg/dL (ref 0.3–1.2)
Total Protein: 7.7 g/dL (ref 6.5–8.1)

## 2019-11-01 LAB — LIPASE, BLOOD: Lipase: 27 U/L (ref 11–51)

## 2019-11-01 LAB — GLUCOSE, CAPILLARY: Glucose-Capillary: 103 mg/dL — ABNORMAL HIGH (ref 70–99)

## 2019-11-01 MED ORDER — SODIUM CHLORIDE 0.9 % IV BOLUS
1000.0000 mL | Freq: Once | INTRAVENOUS | Status: AC
  Administered 2019-11-01: 1000 mL via INTRAVENOUS

## 2019-11-01 MED ORDER — MORPHINE SULFATE (PF) 4 MG/ML IV SOLN
4.0000 mg | Freq: Once | INTRAVENOUS | Status: AC
  Administered 2019-11-01: 4 mg via INTRAVENOUS
  Filled 2019-11-01: qty 1

## 2019-11-01 NOTE — H&P (Signed)
Subjective:   CC: acute cholecystitis  HPI:  Cheryl Knight is a 64 y.o. female who is consulted by Archie Balboa for evaluation of above cc.  Symptoms were first noted several days ago. Pain is sharp, intially as chest pain, now localized to RUQ.  Associated with nausea, exacerbated by nothing specific.     Past Medical History:  has a past medical history of Cancer ([place]) (02/2019), Cataracts, bilateral, Complication of anesthesia, Cough, Difficult intubation, Dyspnea, Family history of breast cancer, Family history of lung cancer, Family history of prostate cancer, Family history of skin cancer, GERD (gastroesophageal reflux disease), Headache, High cholesterol, History of hiatal hernia, Hypertension, Hypothyroidism, Irritable bowel, Osteoarthritis, PONV (postoperative nausea and vomiting), Pre-diabetes, Rheumatoid arthritis ([place]), and Thyroid disease.  Past Surgical History:  has a past surgical history that includes Tonsillectomy; Tubal ligation; Abdominal hysterectomy; Esophageal dilation; Breast lumpectomy with radioactive seed and sentinel lymph node biopsy (Right, 03/28/2019); Cataract extraction, bilateral; and Breast surgery.  Family History: family history includes Breast cancer (age of onset: 59) in her mother; Hypertension in her mother; Lung cancer in her maternal uncle; Prostate cancer (age of onset: 30) in her father; Skin cancer in her maternal uncle; Stroke in her maternal grandmother; Thyroid disease in her mother.  Social History:  reports that she quit smoking about 21 years ago. Her smoking use included cigarettes. She has a 1.75 pack-year smoking history. She has never used smokeless tobacco. She reports current alcohol use. She reports that she does not use drugs.  Current Medications:  Prior to Admission medications   Medication Sig Start Date End Date Taking? Authorizing Provider  aspirin-acetaminophen-caffeine (EXCEDRIN MIGRAINE) [phone] MG tablet Take 2 tablets by mouth  every 6 (six) hours as needed for headache.    [provider]  azelastine (ASTELIN) 0.1 % nasal spray Place 1 spray into both nostrils 2 (two) times daily. 08/06/19   Tyler Pita, MD  Calcium Carbonate Antacid (TUMS CHEWY BITES PO) Take 1 tablet by mouth at bedtime as needed (acid reflux).    [provider]  cyclobenzaprine (FLEXERIL) 10 MG tablet Take 1 tablet (10 mg total) by mouth daily. Patient taking differently: Take 10 mg by mouth at bedtime.  08/28/19   McLean-Scocuzza, Nino Glow, MD  Dexlansoprazole (DEXILANT) 30 MG capsule Take 1 capsule (30 mg total) by mouth daily. 09/20/19   Martyn Ehrich, NP  famotidine-calcium carbonate-magnesium hydroxide (PEPCID COMPLETE) 10-800-165 MG chewable tablet Chew 1 tablet by mouth daily as needed (acid reflux).    [provider]  glucosamine-chondroitin 500-400 MG tablet Take 2 tablets by mouth daily.     [provider]  Guaifenesin 1200 MG TB12 Take 1,200 mg by mouth in the morning and at bedtime.     [provider]  HYDROcodone-acetaminophen (NORCO) 7.5-325 MG tablet Take 1 tablet by mouth every 6 (six) hours as needed for moderate pain.    [provider]  leflunomide (ARAVA) 20 MG tablet TAKE 1 TABLET(20 MG) BY MOUTH DAILY Patient taking differently: Take 20 mg by mouth daily.  09/26/19   Ofilia Neas, PA-C  levothyroxine (SYNTHROID) 100 MCG tablet Daily 30 minutes before food Patient taking differently: Take 100 mcg by mouth daily before breakfast.  02/08/19   McLean-Scocuzza, Nino Glow, MD  lidocaine (XYLOCAINE) 2 % solution Use as directed 15 mLs in the mouth or throat as needed for mouth pain. 10/30/19   [place]ner, Corene Cornea, MD  loratadine (CLARITIN) 10 MG tablet Take 10 mg by mouth  every other day.     [provider]  loratadine-pseudoephedrine (CLARITIN-D 24-HOUR) 10-240 MG 24 hr tablet Take 1 tablet by mouth every other day.    [provider]  Multiple Vitamin  (MULTIVITAMIN WITH MINERALS) TABS tablet Take 1 tablet by mouth daily.    [provider]  olmesartan-hydrochlorothiazide (BENICAR HCT) 20-12.5 MG tablet Take 1 tablet by mouth daily. In am 02/08/19   McLean-Scocuzza, Nino Glow, MD  pantoprazole (PROTONIX) 40 MG tablet Take 40 mg by mouth daily. 10/20/19   [provider]  pilocarpine (SALAGEN) 5 MG tablet Take 5 mg by mouth 3 (three) times daily. 10/29/19   [provider]  pravastatin (PRAVACHOL) 40 MG tablet Take 40 mg by mouth at bedtime. 08/17/19   [provider]  promethazine (PHENERGAN) 25 MG tablet Take 1 tablet (25 mg total) by mouth 2 (two) times daily as needed. Patient taking differently: Take 25 mg by mouth 2 (two) times daily as needed for nausea or vomiting.  08/28/19   McLean-Scocuzza, Nino Glow, MD  RESTASIS 0.05 % ophthalmic emulsion Place 1 drop into both eyes 2 (two) times daily.  01/30/16   [provider]  sitaGLIPtin (JANUVIA) 25 MG tablet Take 1 tablet (25 mg total) by mouth daily. 09/28/19   McLean-Scocuzza, Nino Glow, MD  SUMAtriptan (IMITREX) 50 MG tablet TAKE 1 TABLET BY MOUTH AS NEEDED FOR HEADACHE Patient taking differently: Take 50 mg by mouth every 2 (two) hours as needed for migraine.  02/08/19   McLean-Scocuzza, Nino Glow, MD  tamoxifen (NOLVADEX) 20 MG tablet Take 1 tablet (20 mg total) by mouth daily. 06/04/19   Truitt Merle, MD  Tiotropium Bromide Monohydrate (SPIRIVA RESPIMAT) 1.25 MCG/ACT AERS Inhale 2 puffs into the lungs daily. 10/08/19   Tyler Pita, MD  topiramate (TOPAMAX) 100 MG tablet Take 1 tablet (100 mg total) by mouth daily. 02/08/19   McLean-Scocuzza, Nino Glow, MD    Allergies:  Allergies as of 11/01/2019 - Review Complete 11/01/2019  Allergen Reaction Noted  . Pentazocine lactate Other (See Comments) 02/21/2007  . Demerol [meperidine] Nausea And Vomiting 07/17/2019  . Lisinopril Cough 03/23/2019    ROS:  General: Denies weight loss, weight gain, fatigue,  fevers, chills, and night sweats. Eyes: Denies blurry vision, double vision, eye pain, itchy eyes, and tearing. Ears: Denies hearing loss, earache, and ringing in ears. Nose: Denies sinus pain, congestion, infections, runny nose, and nosebleeds. Mouth/throat: Denies hoarseness, sore throat, bleeding gums, and difficulty swallowing. Heart: Denies chest pain, palpitations, racing heart, irregular heartbeat, leg pain or swelling, and decreased activity tolerance. Respiratory: Denies breathing difficulty, shortness of breath, wheezing, cough, and sputum. GI: Denies change in appetite, heartburn,  vomiting, constipation, diarrhea, and blood in stool. GU: Denies difficulty urinating, pain with urinating, urgency, frequency, blood in urine. Musculoskeletal: Denies joint stiffness, pain, swelling, muscle weakness. Skin: Denies rash, itching, mass, tumors, sores, and boils Neurologic: Denies headache, fainting, dizziness, seizures, numbness, and tingling. Psychiatric: Denies depression, anxiety, difficulty sleeping, and memory loss. Endocrine: Denies heat or cold intolerance, and increased thirst or urination. Blood/lymph: Denies easy bruising, and swollen glands     Objective:     BP 107/65   Pulse (!) 103   Temp 99.9 F (37.7 C) (Oral)   Resp 18   Ht 5\' 8"  (1.727 m)   Wt 92.1 kg   SpO2 97%   BMI 30.87 kg/m    Constitutional :  alert, cooperative, appears stated age and no distress  Lymphatics/Throat:  no asymmetry, masses, or scars  Respiratory:  clear to auscultation bilaterally  Cardiovascular:  regular rate and rhythm  Gastrointestinal: soft, no guarding, focal TTP in RUQ.   Musculoskeletal: Steady movement  Skin: Cool and moist  Psychiatric: Normal affect, non-agitated, not confused       LABS:  CMP Latest Ref Rng & Units 11/01/2019 10/29/2019 09/04/2019  Glucose 70 - 99 mg/dL 124(H) 160(H) 122(H)  BUN 8 - 23 mg/dL 14 11 13   Creatinine 0.44 - 1.00 mg/dL 0.99 0.59 0.76   Sodium 135 - 145 mmol/L 133(L) 137 142  Potassium 3.5 - 5.1 mmol/L 3.3(L) 3.4(L) 3.4(L)  Chloride 98 - 111 mmol/L 99 101 107  CO2 22 - 32 mmol/L 22 24 26   Calcium 8.9 - 10.3 mg/dL 9.1 9.0 10.5(H)  Total Protein 6.5 - 8.1 g/dL 7.7 7.3 7.0  Total Bilirubin 0.3 - 1.2 mg/dL 0.9 0.5 0.4  Alkaline Phos 38 - 126 U/L 41 32(L) 42  AST 15 - 41 U/L 18 21 18   ALT 0 - 44 U/L 12 14 12    CBC Latest Ref Rng & Units 11/01/2019 10/29/2019 09/04/2019  WBC 4.0 - 10.5 K/uL 13.5(H) 11.3(H) 4.7  Hemoglobin 12.0 - 15.0 g/dL 13.4 13.5 13.8  Hematocrit 36 - 46 % 40.1 40.7 41.7  Platelets 150 - 400 K/uL 318 292 258     RADS: CLINICAL DATA:  Right upper quadrant pain for 2 days  EXAM: ULTRASOUND ABDOMEN LIMITED RIGHT UPPER QUADRANT  COMPARISON:  None.  FINDINGS: Gallbladder:  Wall thickening is noted to 5.4 mm. Positive sonographic Murphy's sign is seen. Mild gallbladder sludge is noted. No definitive calculi are seen.  Common bile duct:  Diameter: 4.1 mm.  Liver:  Mildly heterogeneous likely related to fatty infiltration. No focal mass is seen. Portal vein is patent on color Doppler imaging with normal direction of blood flow towards the liver.  Other: None.  IMPRESSION: Dilated gallbladder with wall thickening and positive sonographic Murphy's sign. These findings suggest acute cholecystitis. No calculi are seen although some gallbladder sludge is noted.   Electronically Signed   By: Inez Catalina M.D.   On: 11/01/2019 15:26 Assessment:      Acute cholecystitis  Plan:      Discussed the risk of surgery including post-op infxn, seroma, biloma, chronic pain, poor-delayed wound healing, retained gallstone, conversion to open procedure, post-op SBO or ileus, and need for additional procedures to address said risks.  The risks of general anesthetic including MI, CVA, sudden death or even reaction to anesthetic medications also discussed. Alternatives include continued  observation.  Benefits include possible symptom relief, prevention of complications including acute cholecystitis, pancreatitis.  Typical post operative recovery of 3-5 days rest, continued pain in area and incision sites, possible loose stools up to 4-6 weeks, also discussed.  The patient understands the risks, any and all questions were answered to the patient's satisfaction.  Admit for IV abx, to OR for robotic lap chole.  Continue home meds in the meantime.

## 2019-11-01 NOTE — ED Provider Notes (Signed)
Roderic Palau    CSN: [id number] Arrival date & time: 11/01/19  1103      History   Chief Complaint Chief Complaint  Patient presents with  . Abdominal Pain    HPI Cheryl Knight is a 64 y.o. female.   Patient presents with 2-day history of RUQ abdominal pain and low grade fever.  T-max 101; treatment at home with Tylenol.  RUQ abdominal pain is constant, worse with movement, 10/10 with movement.  She reports associated diarrhea but no emesis.  She was seen at [facility] ED on 10/30/2019; diagnosed with epigastric pain; Per the medical record, her symptoms improved with a GI cocktail so CT scan was deferred; she was discharged with instructions to follow-up with her PCP.  She states she has been unable to get an appointment with her PCP and was advised to come here.  The history is provided by the patient and medical records.    Past Medical History:  Diagnosis Date  . Cancer ([place]) 02/2019   right breast IDC  . Cataracts, bilateral    tbd surgery 2021  . Complication of anesthesia   . Cough   . Difficult intubation    TRACH AT 21  . Dyspnea    W/ COUGHING  . Family history of breast cancer   . Family history of lung cancer   . Family history of prostate cancer   . Family history of skin cancer   . GERD (gastroesophageal reflux disease)   . Headache   . High cholesterol   . History of hiatal hernia   . Hypertension   . Hypothyroidism   . Irritable bowel   . Osteoarthritis    i.e right knee   . PONV (postoperative nausea and vomiting)   . Pre-diabetes   . Rheumatoid arthritis ([place])   . Thyroid disease     Patient Active Problem List   Diagnosis Date Noted  . Pulmonary fibrosis ([place]) 10/01/2019  . Pulmonary nodules 10/01/2019  . Bronchiectasis without complication ([place]) 16/38/4665  . Aortic atherosclerosis ([place]) 10/01/2019  . Hypertension associated with diabetes ([place]) 10/01/2019  . Paresthesia of upper extremity 10/01/2019  . Hiatal hernia  08/16/2019  . Esophagitis 08/16/2019  . Persistent cough 08/16/2019  . Dry eye syndrome 08/05/2019  . Glaucoma 08/05/2019  . Hypertensive retinopathy 08/05/2019  . Arteriosclerosis 08/05/2019  . Pterygium 08/05/2019  . Ocular rosacea 08/05/2019  . Genetic testing 05/02/2019  . Family history of breast cancer   . Family history of prostate cancer   . Family history of skin cancer   . Family history of lung cancer   . Malignant neoplasm of lower-outer quadrant of right breast of female, estrogen receptor positive ([place]) 03/19/2019  . Type 2 diabetes mellitus with hyperglycemia, without long-term current use of insulin ([place]) 02/08/2019  . Allergic rhinitis 02/08/2019  . Osteoarthritis   . Primary osteoarthritis involving multiple joints 11/15/2017  . Moderate asthma with acute exacerbation 05/03/2017  . Routine general medical examination at a health care facility 02/08/2017  . Primary osteoarthritis of both feet 02/10/2016  . Hx of migraine headaches 02/07/2016  . History of hypothyroidism 02/07/2016  . History of gastroesophageal reflux (GERD) 02/07/2016  . Obesity (BMI 30-39.9) 12/30/2015  . Basal cell carcinoma of chest wall 10/05/2011  . Hypertension 09/01/2010  . Hyperlipidemia 02/27/2008  . Hypothyroidism 02/21/2007  . Migraine 02/21/2007  . VAGINITIS, ATROPHIC 02/21/2007  . Psoriasis 02/21/2007  . Sicca syndrome, unspecified 02/21/2007  . GERD 06/29/2006  .  Rheumatoid arthritis ([place]) 06/29/2006    Past Surgical History:  Procedure Laterality Date  . ABDOMINAL HYSTERECTOMY     age 74 ovaries out  . BREAST LUMPECTOMY WITH RADIOACTIVE SEED AND SENTINEL LYMPH NODE BIOPSY Right 03/28/2019   Procedure: RIGHT BREAST LUMPECTOMY WITH RADIOACTIVE SEED AND SENTINEL LYMPH NODE BIOPSY;  Surgeon: Donnie Mesa, MD;  Location: [facility];  Service: General;  Laterality: Right;  PEC BLOCK FOR POST OP PAIN  . BREAST SURGERY    . CATARACT EXTRACTION, BILATERAL    .  ESOPHAGEAL DILATION     Dr. Olevia Perches 15 years from 2021  . TONSILLECTOMY    . TUBAL LIGATION      OB History   No obstetric history on file.      Home Medications    Prior to Admission medications   Medication Sig Start Date End Date Taking? Authorizing Provider  aspirin-acetaminophen-caffeine (EXCEDRIN MIGRAINE) [phone] MG tablet Take 2 tablets by mouth every 6 (six) hours as needed for headache.    [provider]  azelastine (ASTELIN) 0.1 % nasal spray Place 1 spray into both nostrils 2 (two) times daily. 08/06/19   Tyler Pita, MD  betamethasone dipropionate (DIPROLENE) 0.05 % cream Apply topically 2 (two) times daily. Patient taking differently: Apply 1 application topically 2 (two) times daily as needed (psoriasis / eczema).  01/04/14   Dorena Cookey, MD  Calcium Carbonate Antacid (TUMS CHEWY BITES PO) Take 1 tablet by mouth at bedtime as needed (acid reflux).    [provider]  Calcium Carbonate-Vitamin D (CALCIUM PLUS VITAMIN D PO) Take 1 tablet by mouth daily.     [provider]  cyclobenzaprine (FLEXERIL) 10 MG tablet Take 1 tablet (10 mg total) by mouth daily. Patient taking differently: Take 10 mg by mouth at bedtime.  08/28/19   McLean-Scocuzza, Nino Glow, MD  Dexlansoprazole (DEXILANT) 30 MG capsule Take 1 capsule (30 mg total) by mouth daily. 09/20/19   Martyn Ehrich, NP  famotidine-calcium carbonate-magnesium hydroxide (PEPCID COMPLETE) 10-800-165 MG chewable tablet Chew 1 tablet by mouth daily as needed (acid reflux).    [provider]  glucosamine-chondroitin 500-400 MG tablet Take 2 tablets by mouth daily.     [provider]  Guaifenesin 1200 MG TB12 Take 1,200 mg by mouth in the morning and at bedtime.     [provider]  HYDROcodone-acetaminophen (NORCO) 7.5-325 MG tablet Take 1 tablet by mouth every 6 (six) hours as needed for moderate pain.    [provider]  hydrocortisone cream 1 %  Apply 1 application topically daily as needed (rash).     [provider]  leflunomide (ARAVA) 20 MG tablet TAKE 1 TABLET(20 MG) BY MOUTH DAILY Patient taking differently: Take 20 mg by mouth daily.  09/26/19   Ofilia Neas, PA-C  levothyroxine (SYNTHROID) 100 MCG tablet Daily 30 minutes before food Patient taking differently: Take 100 mcg by mouth daily before breakfast.  02/08/19   McLean-Scocuzza, Nino Glow, MD  lidocaine (XYLOCAINE) 2 % solution Use as directed 15 mLs in the mouth or throat as needed for mouth pain. 10/30/19   Mesner, Corene Cornea, MD  loratadine (CLARITIN) 10 MG tablet Take 10 mg by mouth every other day.     [provider]  loratadine-pseudoephedrine (CLARITIN-D 24-HOUR) 10-240 MG 24 hr tablet Take 1 tablet by mouth every other day.    [provider]  Multiple Vitamin (MULTIVITAMIN WITH MINERALS) TABS tablet Take 1 tablet by  mouth daily.    [provider]  olmesartan-hydrochlorothiazide (BENICAR HCT) 20-12.5 MG tablet Take 1 tablet by mouth daily. In am 02/08/19   McLean-Scocuzza, Nino Glow, MD  pravastatin (PRAVACHOL) 20 MG tablet Take 1 tablet (20 mg total) by mouth at bedtime. 08/16/19   McLean-Scocuzza, Nino Glow, MD  promethazine (PHENERGAN) 25 MG tablet Take 1 tablet (25 mg total) by mouth 2 (two) times daily as needed. Patient taking differently: Take 25 mg by mouth 2 (two) times daily as needed for nausea or vomiting.  08/28/19   McLean-Scocuzza, Nino Glow, MD  RESTASIS 0.05 % ophthalmic emulsion Place 1 drop into both eyes 2 (two) times daily.  01/30/16   [provider]  sitaGLIPtin (JANUVIA) 25 MG tablet Take 1 tablet (25 mg total) by mouth daily. 09/28/19   McLean-Scocuzza, Nino Glow, MD  SUMAtriptan (IMITREX) 50 MG tablet TAKE 1 TABLET BY MOUTH AS NEEDED FOR HEADACHE Patient taking differently: Take 50 mg by mouth every 2 (two) hours as needed for migraine.  02/08/19   McLean-Scocuzza, Nino Glow, MD  tamoxifen (NOLVADEX) 20 MG tablet Take 1  tablet (20 mg total) by mouth daily. 06/04/19   Truitt Merle, MD  Tiotropium Bromide Monohydrate (SPIRIVA RESPIMAT) 1.25 MCG/ACT AERS Inhale 2 puffs into the lungs daily. 10/08/19   Tyler Pita, MD  topiramate (TOPAMAX) 100 MG tablet Take 1 tablet (100 mg total) by mouth daily. 02/08/19   McLean-Scocuzza, Nino Glow, MD    Family History Family History  Problem Relation Age of Onset  . Hypertension Mother   . Thyroid disease Mother   . Breast cancer Mother 82       breast dx age 76 died age 68   . Prostate cancer Father 79       metastatic  . Stroke Maternal Grandmother   . Skin cancer Maternal Uncle        dx. in his 66s  . Lung cancer Maternal Uncle        smoker, died age 22    Social History Social History   Tobacco Use  . Smoking status: Former Smoker    Packs/day: 0.25    Years: 7.00    Pack years: 1.75    Types: Cigarettes    Quit date: 2000    Years since quitting: 21.8  . Smokeless tobacco: Never Used  Vaping Use  . Vaping Use: Never used  Substance Use Topics  . Alcohol use: Yes    Comment: socially   . Drug use: Never     Allergies   Demerol [meperidine], Lisinopril, and Pentazocine lactate   Review of Systems Review of Systems  Constitutional: Positive for fever. Negative for chills.  HENT: Negative for ear pain and sore throat.   Eyes: Negative for pain and visual disturbance.  Respiratory: Negative for cough and shortness of breath.   Cardiovascular: Negative for chest pain and palpitations.  Gastrointestinal: Positive for diarrhea. Negative for vomiting.  Genitourinary: Negative for dysuria and hematuria.  Musculoskeletal: Negative for arthralgias and back pain.  Skin: Negative for color change and rash.  Neurological: Negative for seizures and syncope.  All other systems reviewed and are negative.    Physical Exam Triage Vital Signs ED Triage Vitals  Enc Vitals Group     BP 11/01/19 1201 90/63     Pulse Rate 11/01/19 1201 (!) 117      Resp 11/01/19 1201 16     Temp 11/01/19 1201 99.4 F (37.4 C)  Temp Source 11/01/19 1201 Oral     SpO2 11/01/19 1201 94 %     Weight --      Height --      Head Circumference --      Peak Flow --      Pain Score 11/01/19 1159 1     Pain Loc --      Pain Edu? --      Excl. in [place]? --    No data found.  Updated Vital Signs BP 90/63 (BP Location: Left Arm)   Pulse (!) 117   Temp 99.4 F (37.4 C) (Oral)   Resp 16   SpO2 94% Comment: nail polish  Visual Acuity Right Eye Distance:   Left Eye Distance:   Bilateral Distance:    Right Eye Near:   Left Eye Near:    Bilateral Near:     Physical Exam Vitals and nursing note reviewed.  Constitutional:      General: She is not in acute distress.    Appearance: She is well-developed. She is ill-appearing.  HENT:     Head: Normocephalic and atraumatic.     Mouth/Throat:     Mouth: Mucous membranes are moist.  Eyes:     Conjunctiva/sclera: Conjunctivae normal.  Cardiovascular:     Rate and Rhythm: Normal rate and regular rhythm.     Heart sounds: No murmur heard.   Pulmonary:     Effort: Pulmonary effort is normal. No respiratory distress.     Breath sounds: Normal breath sounds.  Abdominal:     Palpations: Abdomen is soft.     Tenderness: There is abdominal tenderness in the right upper quadrant. There is no guarding or rebound. Positive signs include Murphy's sign.  Musculoskeletal:     Cervical back: Neck supple.  Skin:    General: Skin is warm and dry.  Neurological:     General: No focal deficit present.     Mental Status: She is alert and oriented to person, place, and time.     Gait: Gait normal.  Psychiatric:        Mood and Affect: Mood normal.        Behavior: Behavior normal.      UC Treatments / Results  Labs (all labs ordered are listed, but only abnormal results are displayed) Labs Reviewed - No data to display  EKG   Radiology No results found.  Procedures Procedures (including critical  care time)  Medications Ordered in UC Medications - No data to display  Initial Impression / Assessment and Plan / UC Course  I have reviewed the triage vital signs and the nursing notes.  Pertinent labs & imaging results that were available during my care of the patient were reviewed by me and considered in my medical decision making (see chart for details).    RUQ abdominal pain.  Patient is acutely tender in her right upper abdomen.  Sending her to the ED for evaluation.  Patient declines EMS and feels stable to drive herself.      Final Clinical Impressions(s) / UC Diagnoses   Final diagnoses:  Right upper quadrant abdominal pain     Discharge Instructions     Go to the emergency department for evaluation of your right upper abdominal pain.        ED Prescriptions    None     PDMP not reviewed this encounter.   Sharion Balloon, NP 11/01/19 1243

## 2019-11-01 NOTE — Telephone Encounter (Signed)
For your information  

## 2019-11-01 NOTE — ED Triage Notes (Signed)
Pt in via POV, reports RUQ pain with fever since Wednesday.  Also reports N/V/D.  Pt appears uncomfortable.

## 2019-11-01 NOTE — Telephone Encounter (Signed)
See messages below.  

## 2019-11-01 NOTE — Telephone Encounter (Signed)
Patient called in stated that she had temp of 101 and chest pain ,diarrhea she was transferred to Access nurse on 11-01-19

## 2019-11-01 NOTE — ED Triage Notes (Addendum)
Patient states went to the ED on Monday for chest pain/GERD pain. Ruled out heart and pancreas. Was told that she may need CT/Ultrasound scans for further management.   States is now having right upper quadrant pain, has had temp and taken tylenol. States pain increases with movement. Has not been able to eat or sleep since Monday due to pain.

## 2019-11-01 NOTE — ED Provider Notes (Signed)
[facility] Emergency Department Provider Note   ____________________________________________   I have reviewed the triage vital signs and the nursing notes.   HISTORY  Chief Complaint Abdominal Pain   History limited by: Not Limited   HPI Cheryl Knight is a 64 y.o. female who presents to the emergency department today because of concern for abdominal pain. The patient states that she first started having pain 3 days ago. Was actually seen at on OSH ED where she was treated for gerd. Continued to have pain and then developed fevers. Also started having diarrhea. Stated that eating or drinking anything would make her diarrhea and bloating worse. The patient denies any known history of gallbladder disease.   Records reviewed. Per medical record review patient has a history of GERD  Past Medical History:  Diagnosis Date  . Cancer ([place]) 02/2019   right breast IDC  . Cataracts, bilateral    tbd surgery 2021  . Complication of anesthesia   . Cough   . Difficult intubation    TRACH AT 19  . Dyspnea    W/ COUGHING  . Family history of breast cancer   . Family history of lung cancer   . Family history of prostate cancer   . Family history of skin cancer   . GERD (gastroesophageal reflux disease)   . Headache   . High cholesterol   . History of hiatal hernia   . Hypertension   . Hypothyroidism   . Irritable bowel   . Osteoarthritis    i.e right knee   . PONV (postoperative nausea and vomiting)   . Pre-diabetes   . Rheumatoid arthritis ([place])   . Thyroid disease     Patient Active Problem List   Diagnosis Date Noted  . Pulmonary fibrosis ([place]) 10/01/2019  . Pulmonary nodules 10/01/2019  . Bronchiectasis without complication ([place]) 02/63/7858  . Aortic atherosclerosis ([place]) 10/01/2019  . Hypertension associated with diabetes ([place]) 10/01/2019  . Paresthesia of upper extremity 10/01/2019  . Hiatal hernia 08/16/2019  . Esophagitis 08/16/2019   . Persistent cough 08/16/2019  . Dry eye syndrome 08/05/2019  . Glaucoma 08/05/2019  . Hypertensive retinopathy 08/05/2019  . Arteriosclerosis 08/05/2019  . Pterygium 08/05/2019  . Ocular rosacea 08/05/2019  . Genetic testing 05/02/2019  . Family history of breast cancer   . Family history of prostate cancer   . Family history of skin cancer   . Family history of lung cancer   . Malignant neoplasm of lower-outer quadrant of right breast of female, estrogen receptor positive ([place]) 03/19/2019  . Type 2 diabetes mellitus with hyperglycemia, without long-term current use of insulin ([place]) 02/08/2019  . Allergic rhinitis 02/08/2019  . Osteoarthritis   . Primary osteoarthritis involving multiple joints 11/15/2017  . Moderate asthma with acute exacerbation 05/03/2017  . Routine general medical examination at a health care facility 02/08/2017  . Primary osteoarthritis of both feet 02/10/2016  . Hx of migraine headaches 02/07/2016  . History of hypothyroidism 02/07/2016  . History of gastroesophageal reflux (GERD) 02/07/2016  . Obesity (BMI 30-39.9) 12/30/2015  . Basal cell carcinoma of chest wall 10/05/2011  . Hypertension 09/01/2010  . Hyperlipidemia 02/27/2008  . Hypothyroidism 02/21/2007  . Migraine 02/21/2007  . VAGINITIS, ATROPHIC 02/21/2007  . Psoriasis 02/21/2007  . Sicca syndrome, unspecified 02/21/2007  . GERD 06/29/2006  . Rheumatoid arthritis ([place]) 06/29/2006    Past Surgical History:  Procedure Laterality Date  . ABDOMINAL HYSTERECTOMY     age 36 ovaries out  .  BREAST LUMPECTOMY WITH RADIOACTIVE SEED AND SENTINEL LYMPH NODE BIOPSY Right 03/28/2019   Procedure: RIGHT BREAST LUMPECTOMY WITH RADIOACTIVE SEED AND SENTINEL LYMPH NODE BIOPSY;  Surgeon: Donnie Mesa, MD;  Location: [place];  Service: General;  Laterality: Right;  PEC BLOCK FOR POST OP PAIN  . BREAST SURGERY    . CATARACT EXTRACTION, BILATERAL    . ESOPHAGEAL DILATION     Dr. Olevia Perches 15  years from 2021  . TONSILLECTOMY    . TUBAL LIGATION      Prior to Admission medications   Medication Sig Start Date End Date Taking? Authorizing Provider  aspirin-acetaminophen-caffeine (EXCEDRIN MIGRAINE) [phone] MG tablet Take 2 tablets by mouth every 6 (six) hours as needed for headache.    [provider]  azelastine (ASTELIN) 0.1 % nasal spray Place 1 spray into both nostrils 2 (two) times daily. 08/06/19   Tyler Pita, MD  betamethasone dipropionate (DIPROLENE) 0.05 % cream Apply topically 2 (two) times daily. Patient taking differently: Apply 1 application topically 2 (two) times daily as needed (psoriasis / eczema).  01/04/14   Dorena Cookey, MD  Calcium Carbonate Antacid (TUMS CHEWY BITES PO) Take 1 tablet by mouth at bedtime as needed (acid reflux).    [provider]  Calcium Carbonate-Vitamin D (CALCIUM PLUS VITAMIN D PO) Take 1 tablet by mouth daily.     [provider]  cyclobenzaprine (FLEXERIL) 10 MG tablet Take 1 tablet (10 mg total) by mouth daily. Patient taking differently: Take 10 mg by mouth at bedtime.  08/28/19   McLean-Scocuzza, Nino Glow, MD  Dexlansoprazole (DEXILANT) 30 MG capsule Take 1 capsule (30 mg total) by mouth daily. 09/20/19   Martyn Ehrich, NP  famotidine-calcium carbonate-magnesium hydroxide (PEPCID COMPLETE) 10-800-165 MG chewable tablet Chew 1 tablet by mouth daily as needed (acid reflux).    [provider]  glucosamine-chondroitin 500-400 MG tablet Take 2 tablets by mouth daily.     [provider]  Guaifenesin 1200 MG TB12 Take 1,200 mg by mouth in the morning and at bedtime.     [provider]  HYDROcodone-acetaminophen (NORCO) 7.5-325 MG tablet Take 1 tablet by mouth every 6 (six) hours as needed for moderate pain.    [provider]  hydrocortisone cream 1 % Apply 1 application topically daily as needed (rash).     [provider]  leflunomide (ARAVA) 20 MG tablet  TAKE 1 TABLET(20 MG) BY MOUTH DAILY Patient taking differently: Take 20 mg by mouth daily.  09/26/19   Ofilia Neas, PA-C  levothyroxine (SYNTHROID) 100 MCG tablet Daily 30 minutes before food Patient taking differently: Take 100 mcg by mouth daily before breakfast.  02/08/19   McLean-Scocuzza, Nino Glow, MD  lidocaine (XYLOCAINE) 2 % solution Use as directed 15 mLs in the mouth or throat as needed for mouth pain. 10/30/19   Mesner, Corene Cornea, MD  loratadine (CLARITIN) 10 MG tablet Take 10 mg by mouth every other day.     [provider]  loratadine-pseudoephedrine (CLARITIN-D 24-HOUR) 10-240 MG 24 hr tablet Take 1 tablet by mouth every other day.    [provider]  Multiple Vitamin (MULTIVITAMIN WITH MINERALS) TABS tablet Take 1 tablet by mouth daily.    [provider]  olmesartan-hydrochlorothiazide (BENICAR HCT) 20-12.5 MG tablet Take 1 tablet by mouth daily. In am 02/08/19   McLean-Scocuzza, Nino Glow, MD  pravastatin (PRAVACHOL) 20 MG tablet Take 1 tablet (20 mg total) by mouth at bedtime. 08/16/19  McLean-Scocuzza, Nino Glow, MD  promethazine (PHENERGAN) 25 MG tablet Take 1 tablet (25 mg total) by mouth 2 (two) times daily as needed. Patient taking differently: Take 25 mg by mouth 2 (two) times daily as needed for nausea or vomiting.  08/28/19   McLean-Scocuzza, Nino Glow, MD  RESTASIS 0.05 % ophthalmic emulsion Place 1 drop into both eyes 2 (two) times daily.  01/30/16   [provider]  sitaGLIPtin (JANUVIA) 25 MG tablet Take 1 tablet (25 mg total) by mouth daily. 09/28/19   McLean-Scocuzza, Nino Glow, MD  SUMAtriptan (IMITREX) 50 MG tablet TAKE 1 TABLET BY MOUTH AS NEEDED FOR HEADACHE Patient taking differently: Take 50 mg by mouth every 2 (two) hours as needed for migraine.  02/08/19   McLean-Scocuzza, Nino Glow, MD  tamoxifen (NOLVADEX) 20 MG tablet Take 1 tablet (20 mg total) by mouth daily. 06/04/19   Truitt Merle, MD  Tiotropium Bromide Monohydrate (SPIRIVA RESPIMAT) 1.25  MCG/ACT AERS Inhale 2 puffs into the lungs daily. 10/08/19   Tyler Pita, MD  topiramate (TOPAMAX) 100 MG tablet Take 1 tablet (100 mg total) by mouth daily. 02/08/19   McLean-Scocuzza, Nino Glow, MD    Allergies Pentazocine lactate, Demerol [meperidine], and Lisinopril  Family History  Problem Relation Age of Onset  . Hypertension Mother   . Thyroid disease Mother   . Breast cancer Mother 64       breast dx age 53 died age 60   . Prostate cancer Father 70       metastatic  . Stroke Maternal Grandmother   . Skin cancer Maternal Uncle        dx. in his 7s  . Lung cancer Maternal Uncle        smoker, died age 71    Social History Social History   Tobacco Use  . Smoking status: Former Smoker    Packs/day: 0.25    Years: 7.00    Pack years: 1.75    Types: Cigarettes    Quit date: 2000    Years since quitting: 21.8  . Smokeless tobacco: Never Used  Vaping Use  . Vaping Use: Never used  Substance Use Topics  . Alcohol use: Yes  . Drug use: Never    Review of Systems Constitutional: No fever/chills Eyes: No visual changes. ENT: No sore throat. Cardiovascular: Denies chest pain. Respiratory: Denies shortness of breath. Gastrointestinal: Positive for abdominal pain. Positive for diarrhea.  Genitourinary: Negative for dysuria. Musculoskeletal: Negative for back pain. Skin: Negative for rash. Neurological: Negative for headaches, focal weakness or numbness.  ____________________________________________   PHYSICAL EXAM:  VITAL SIGNS: ED Triage Vitals  Enc Vitals Group     BP 11/01/19 1329 (!) 102/55     Pulse Rate 11/01/19 1329 (!) 119     Resp 11/01/19 1329 20     Temp 11/01/19 1329 99.9 F (37.7 C)     Temp Source 11/01/19 1329 Oral     SpO2 11/01/19 1329 96 %     Weight 11/01/19 1352 203 lb (92.1 kg)     Height 11/01/19 1352 5\' 8"  (1.727 m)     Head Circumference --      Peak Flow --      Pain Score 11/01/19 1352 2   Constitutional: Alert and  oriented.  Eyes: Conjunctivae are normal.  ENT      Head: Normocephalic and atraumatic.      Nose: No congestion/rhinnorhea.      Mouth/Throat: Mucous membranes are moist.  Neck: No stridor. Hematological/Lymphatic/Immunilogical: No cervical lymphadenopathy. Cardiovascular: Normal rate, regular rhythm.  No murmurs, rubs, or gallops. Respiratory: Normal respiratory effort without tachypnea nor retractions. Breath sounds are clear and equal bilaterally. No wheezes/rales/rhonchi. Gastrointestinal: Soft and tender to palpation in the right upper quadrant.  Genitourinary: Deferred Musculoskeletal: Normal range of motion in all extremities. No lower extremity edema. Neurologic:  Normal speech and language. No gross focal neurologic deficits are appreciated.  Skin:  Skin is warm, dry and intact. No rash noted. Psychiatric: Mood and affect are normal. Speech and behavior are normal. Patient exhibits appropriate insight and judgment.  ____________________________________________    LABS (pertinent positives/negatives)  Lipase 27 Lactic acid 1.2 CMP na 133, k 3.3, glu 124, cr 0.99 CBC wbc 13.5, hgb 13.4, plt 318 UA cloudy, small bili, nitrite negative, 0-5 rbc, 21-50 wbc  ____________________________________________   EKG  None  ____________________________________________    RADIOLOGY  [place] RUQ Consistent with acute cholecystitis  ____________________________________________   PROCEDURES  Procedures  ____________________________________________   INITIAL IMPRESSION / ASSESSMENT AND PLAN / ED COURSE  Pertinent labs & imaging results that were available during my care of the patient were reviewed by me and considered in my medical decision making (see chart for details).   Patient presented to the emergency department today because of concern for RUQ pain. Work up is concerning for acute cholecystitis. Discussed this with the patient. Will admit to surgical service.    ___________________________________________   FINAL CLINICAL IMPRESSION(S) / ED DIAGNOSES  Final diagnoses:  RUQ pain  Acute cholecystitis     Note: This dictation was prepared with Dragon dictation. Any transcriptional errors that result from this process are unintentional     Nance Pear, MD 11/01/19 [phone]

## 2019-11-01 NOTE — ED Notes (Signed)
Patient is being discharged from the Urgent Care and sent to the Emergency Department via POV . Per Hall Busing NP, patient is in need of higher level of care due to acute RUQ pain. Patient is aware and verbalizes understanding of plan of care.  Vitals:   11/01/19 1201  BP: 90/63  Pulse: (!) 117  Resp: 16  Temp: 99.4 F (37.4 C)  SpO2: 94%

## 2019-11-01 NOTE — Telephone Encounter (Signed)
[place] RECORD AccessNurse Patient Name: Cheryl Knight Gender: Female DOB: 08/05/1955 Age: 64 Y 34 M 23 D Return Phone Number: [phone] (Primary) Address: City/State/Zip: [place] [address] Client [facility] - [facility] Client Site [place] - Day Physician McClean-Scocuzza, Olivia Mackie Contact Type Call Who Is Calling Patient / Member / Family / Caregiver Call Type Triage / Clinical Relationship To Patient Self Return Phone Number [phone] (Primary) Chief Complaint CHEST PAIN (>=21 years) - pain, pressure, heaviness or tightness Reason for Call Symptomatic / Request for [place] reports that she has a fever of 101, diarrhea and chest pain. Translation No Nurse Assessment Nurse: Clovis Riley, RN, Georgina Peer Date/Time (Eastern Time): 11/01/2019 9:56:17 AM Confirm and document reason for call. If symptomatic, describe symptoms. ---Caller reports that she has a fever of 101 that started yesterday, diarrhea and chest pain. Chest pain was on monday and she went to the ER. States she is having pain in her right upper quad. Pain is constant and unbearable with movement 10/10. Diarrhea is still present and she has had 3 stools in the last 24hours. Urinating good. Drinking good. Does the patient have any new or worsening symptoms? ---Yes Will a triage be completed? ---Yes Related visit to physician within the last 2 weeks? ---Yes Does the PT have any chronic conditions? (i.e. diabetes, asthma, this includes High risk factors for pregnancy, etc.) ---Yes List chronic conditions. ---diabetes, thyroid, hx breast cancer, HTn Is this a behavioral health or substance abuse call? ---No Guidelines Guideline Title Affirmed Question Affirmed Notes Nurse Date/Time Eilene Ghazi Time) Abdominal Pain - Upper [1] Fever > 101 F (38.3 C) AND [2] age >  73 years Clovis Riley, Dianna Limbo 11/01/2019 9:59:44 AM Disp. Time Eilene Ghazi Time) Disposition Final User 11/01/2019 9:55:15 AM Send to Urgent Corey Harold, Harlon Flor NOTE: All timestamps contained within this report are represented as [place] Standard Time. CONFIDENTIALTY NOTICE: This fax transmission is intended only for the addressee. It contains information that is legally privileged, confidential or otherwise protected from use or disclosure. If you are not the intended recipient, you are strictly prohibited from reviewing, disclosing, copying using or disseminating any of this information or taking any action in reliance on or regarding this information. If you have received this fax in error, please notify us immediately by telephone so that we can arrange for its return to [place]. Phone: [phone], Toll-Free: [phone], Fax: [phone] Page: 2 of 2 Call Id: [id number] 11/01/2019 10:02:25 AM See HCP within 4 Hours (or PCP triage) Yes Clovis Riley, RN, Leilani Merl Disagree/Comply Comply Caller Understands Yes PreDisposition Did not know what to do Care Advice Given Per Guideline SEE HCP (OR PCP TRIAGE) WITHIN 4 HOURS: * IF OFFICE WILL BE OPEN: You need to be seen within the next 3 or 4 hours. Call your doctor (or NP/PA) now or as soon as the office opens. FEVER MEDICINE - ACETAMINOPHEN: * Fever above 101 F (38.3 C) should be treated with acetaminophen (e.g., Tylenol). This can be taken by mouth as pills or per rectum using a suppository. Both are available over the counter. Usual adult dose is 650 mg by mouth or per rectum every 6 hours. * The goal of fever therapy is to bring the fever down to a comfortable level. Remember that fever medicine usually lowers fever 2-3 F (1-1.5 C). CARE ADVICE given per Abdominal Pain, Upper (Adult) guideline. CALL BACK IF: * You become  worse Referrals Warm transfer to backline

## 2019-11-01 NOTE — Discharge Instructions (Addendum)
Go to the emergency department for evaluation of your right upper abdominal pain.

## 2019-11-01 NOTE — Telephone Encounter (Signed)
Spoken with nurse and informed her that patient would need to go to UC/ED due to no appointments available until tomorrow. Their recommendation is to be evaluated within 4 hours due to her sx and age.

## 2019-11-01 NOTE — Telephone Encounter (Signed)
Nurse Line called stating that pt needed to be seen in the next 4 hours  Transferred to [facility]

## 2019-11-02 ENCOUNTER — Encounter
Admission: EM | Disposition: A | Discharge status: Home / Self Care | Payer: Self-pay | Source: Home / Self Care | Attending: Surgery

## 2019-11-02 ENCOUNTER — Inpatient Hospital Stay: Payer: PPO | Admitting: Certified Registered"

## 2019-11-02 ENCOUNTER — Encounter: Payer: Self-pay | Admitting: Surgery

## 2019-11-02 LAB — CBC
HCT: 35.2 % — ABNORMAL LOW (ref 36.0–46.0)
Hemoglobin: 11.6 g/dL — ABNORMAL LOW (ref 12.0–15.0)
MCH: 30.6 pg (ref 26.0–34.0)
MCHC: 33 g/dL (ref 30.0–36.0)
MCV: 92.9 fL (ref 80.0–100.0)
Platelets: 284 10*3/uL (ref 150–400)
RBC: 3.79 MIL/uL — ABNORMAL LOW (ref 3.87–5.11)
RDW: 13.1 % (ref 11.5–15.5)
WBC: 8.4 10*3/uL (ref 4.0–10.5)
nRBC: 0 % (ref 0.0–0.2)

## 2019-11-02 LAB — GLUCOSE, CAPILLARY
Glucose-Capillary: 158 mg/dL — ABNORMAL HIGH (ref 70–99)
Glucose-Capillary: 200 mg/dL — ABNORMAL HIGH (ref 70–99)

## 2019-11-02 LAB — BASIC METABOLIC PANEL
Anion gap: 10 (ref 5–15)
BUN: 11 mg/dL (ref 8–23)
CO2: 24 mmol/L (ref 22–32)
Calcium: 8.4 mg/dL — ABNORMAL LOW (ref 8.9–10.3)
Chloride: 103 mmol/L (ref 98–111)
Creatinine, Ser: 0.69 mg/dL (ref 0.44–1.00)
GFR, Estimated: >60 mL/min (ref >60)
Glucose, Bld: 107 mg/dL — ABNORMAL HIGH (ref 70–99)
Potassium: 3.1 mmol/L — ABNORMAL LOW (ref 3.5–5.1)
Sodium: 137 mmol/L (ref 135–145)

## 2019-11-02 LAB — HEPATIC FUNCTION PANEL
ALT: 12 U/L (ref 0–44)
AST: 16 U/L (ref 15–41)
Albumin: 3.1 g/dL — ABNORMAL LOW (ref 3.5–5.0)
Alkaline Phosphatase: 39 U/L (ref 38–126)
Bilirubin, Direct: 0.1 mg/dL (ref 0.0–0.2)
Indirect Bilirubin: 0.5 mg/dL (ref 0.3–0.9)
Total Bilirubin: 0.6 mg/dL (ref 0.3–1.2)
Total Protein: 6.6 g/dL (ref 6.5–8.1)

## 2019-11-02 LAB — MAGNESIUM: Magnesium: 2.4 mg/dL (ref 1.7–2.4)

## 2019-11-02 LAB — HIV ANTIBODY (ROUTINE TESTING W REFLEX): HIV Screen 4th Generation wRfx: NONREACTIVE

## 2019-11-02 LAB — PHOSPHORUS: Phosphorus: 2 mg/dL — ABNORMAL LOW (ref 2.5–4.6)

## 2019-11-02 SURGERY — CHOLECYSTECTOMY, ROBOT-ASSISTED, LAPAROSCOPIC
Anesthesia: General

## 2019-11-02 MED ORDER — CYCLOSPORINE 0.05 % OP EMUL
1.0000 [drp] | Freq: Two times a day (BID) | OPHTHALMIC | Status: DC
  Administered 2019-11-02 – 2019-11-03 (×2): 1 [drp] via OPHTHALMIC
  Filled 2019-11-02 (×4): qty 1

## 2019-11-02 MED ORDER — PROPOFOL 10 MG/ML IV BOLUS
INTRAVENOUS | Status: DC | PRN
  Administered 2019-11-02: 50 mg via INTRAVENOUS
  Administered 2019-11-02: 150 mg via INTRAVENOUS

## 2019-11-02 MED ORDER — OLMESARTAN MEDOXOMIL-HCTZ 20-12.5 MG PO TABS: 1.0000 | ORAL_TABLET | Freq: Every day | ORAL | Status: DC

## 2019-11-02 MED ORDER — INDOCYANINE GREEN 25 MG IV SOLR
1.2500 mg | Freq: Once | INTRAVENOUS | Status: AC
  Administered 2019-11-02: 1.25 mg via INTRAVENOUS
  Filled 2019-11-02: qty 0.5

## 2019-11-02 MED ORDER — SUGAMMADEX SODIUM 500 MG/5ML IV SOLN
INTRAVENOUS | Status: AC
  Filled 2019-11-02: qty 5

## 2019-11-02 MED ORDER — ENOXAPARIN SODIUM 40 MG/0.4ML ~~LOC~~ SOLN
40.0000 mg | SUBCUTANEOUS | Status: DC
  Administered 2019-11-03: 40 mg via SUBCUTANEOUS
  Filled 2019-11-02: qty 0.4

## 2019-11-02 MED ORDER — FAMOTIDINE 20 MG PO TABS: 10.0000 mg | ORAL_TABLET | Freq: Every day | ORAL | Status: DC | PRN

## 2019-11-02 MED ORDER — HYDROCODONE-ACETAMINOPHEN 5-325 MG PO TABS
1.0000 | ORAL_TABLET | Freq: Four times a day (QID) | ORAL | 0 refills | Status: DC | PRN
Start: 2019-11-02 — End: 2019-11-02

## 2019-11-02 MED ORDER — LIDOCAINE-EPINEPHRINE (PF) 1 %-1:200000 IJ SOLN
INTRAMUSCULAR | Status: AC
  Filled 2019-11-02: qty 30

## 2019-11-02 MED ORDER — MORPHINE SULFATE (PF) 2 MG/ML IV SOLN
2.0000 mg | INTRAVENOUS | Status: DC | PRN
  Administered 2019-11-02: 2 mg via INTRAVENOUS
  Filled 2019-11-02: qty 1

## 2019-11-02 MED ORDER — ONDANSETRON HCL 4 MG/2ML IJ SOLN
INTRAMUSCULAR | Status: DC | PRN
  Administered 2019-11-02 (×2): 4 mg via INTRAVENOUS

## 2019-11-02 MED ORDER — BUPIVACAINE HCL (PF) 0.5 % IJ SOLN
INTRAMUSCULAR | Status: AC
  Filled 2019-11-02: qty 30

## 2019-11-02 MED ORDER — ADULT MULTIVITAMIN W/MINERALS CH
1.0000 | ORAL_TABLET | Freq: Every day | ORAL | Status: DC
  Administered 2019-11-03: 1 via ORAL
  Filled 2019-11-02: qty 1

## 2019-11-02 MED ORDER — LIDOCAINE-EPINEPHRINE (PF) 1 %-1:200000 IJ SOLN
INTRAMUSCULAR | Status: DC | PRN
  Administered 2019-11-02: 10 mL

## 2019-11-02 MED ORDER — SUMATRIPTAN SUCCINATE 50 MG PO TABS
50.0000 mg | ORAL_TABLET | ORAL | Status: DC | PRN
  Filled 2019-11-02: qty 1

## 2019-11-02 MED ORDER — OXYCODONE HCL 5 MG PO TABS: 5.0000 mg | ORAL_TABLET | Freq: Once | ORAL | Status: DC | PRN

## 2019-11-02 MED ORDER — HYDROCODONE-ACETAMINOPHEN 5-325 MG PO TABS
1.0000 | ORAL_TABLET | Freq: Four times a day (QID) | ORAL | 0 refills | Status: DC | PRN
Start: 2019-11-02 — End: 2019-12-18

## 2019-11-02 MED ORDER — TAMOXIFEN CITRATE 10 MG PO TABS
20.0000 mg | ORAL_TABLET | Freq: Every day | ORAL | Status: DC
  Administered 2019-11-03: 20 mg via ORAL
  Filled 2019-11-02 (×2): qty 2

## 2019-11-02 MED ORDER — MIDAZOLAM HCL 2 MG/2ML IJ SOLN
INTRAMUSCULAR | Status: AC
  Filled 2019-11-02: qty 2

## 2019-11-02 MED ORDER — LEVOTHYROXINE SODIUM 100 MCG PO TABS
100.0000 ug | ORAL_TABLET | Freq: Every day | ORAL | Status: DC
  Administered 2019-11-03: 100 ug via ORAL
  Filled 2019-11-02 (×2): qty 1

## 2019-11-02 MED ORDER — DOCUSATE SODIUM 100 MG PO CAPS: 100.0000 mg | ORAL_CAPSULE | Freq: Two times a day (BID) | ORAL | Status: DC | PRN

## 2019-11-02 MED ORDER — OXYCODONE HCL 5 MG/5ML PO SOLN: 5.0000 mg | Freq: Once | ORAL | Status: DC | PRN

## 2019-11-02 MED ORDER — GLYCOPYRROLATE 0.2 MG/ML IJ SOLN
INTRAMUSCULAR | Status: AC
  Filled 2019-11-02: qty 1

## 2019-11-02 MED ORDER — GLUCOSAMINE-CHONDROITIN 500-400 MG PO TABS: 2.0000 | ORAL_TABLET | Freq: Every day | ORAL | Status: DC

## 2019-11-02 MED ORDER — LIDOCAINE-EPINEPHRINE (PF) 2 %-1:200000 IJ SOLN: INTRAMUSCULAR | Status: DC | PRN

## 2019-11-02 MED ORDER — PROMETHAZINE HCL 25 MG/ML IJ SOLN: 6.2500 mg | INTRAMUSCULAR | Status: DC | PRN

## 2019-11-02 MED ORDER — CALCIUM CARBONATE ANTACID 500 MG PO CHEW: 750.0000 mg | CHEWABLE_TABLET | Freq: Every day | ORAL | Status: DC | PRN

## 2019-11-02 MED ORDER — FENTANYL CITRATE (PF) 100 MCG/2ML IJ SOLN
INTRAMUSCULAR | Status: AC
Start: 2019-11-02 — End: ?
  Filled 2019-11-02: qty 2

## 2019-11-02 MED ORDER — LIDOCAINE HCL (CARDIAC) PF 100 MG/5ML IV SOSY
PREFILLED_SYRINGE | INTRAVENOUS | Status: DC | PRN
  Administered 2019-11-02: 100 mg via INTRAVENOUS

## 2019-11-02 MED ORDER — ONDANSETRON HCL 4 MG/2ML IJ SOLN
4.0000 mg | Freq: Four times a day (QID) | INTRAMUSCULAR | Status: DC | PRN
  Administered 2019-11-02: 4 mg via INTRAVENOUS
  Filled 2019-11-02: qty 2

## 2019-11-02 MED ORDER — DEXMEDETOMIDINE (PRECEDEX) IN NS 20 MCG/5ML (4 MCG/ML) IV SYRINGE
PREFILLED_SYRINGE | INTRAVENOUS | Status: DC | PRN
  Administered 2019-11-02: 4 ug via INTRAVENOUS

## 2019-11-02 MED ORDER — HYDROCHLOROTHIAZIDE 12.5 MG PO CAPS
12.5000 mg | ORAL_CAPSULE | Freq: Every day | ORAL | Status: DC
  Administered 2019-11-03: 12.5 mg via ORAL
  Filled 2019-11-02: qty 1

## 2019-11-02 MED ORDER — ROCURONIUM BROMIDE 10 MG/ML (PF) SYRINGE
PREFILLED_SYRINGE | INTRAVENOUS | Status: AC
  Filled 2019-11-02: qty 20

## 2019-11-02 MED ORDER — DOCUSATE SODIUM 100 MG PO CAPS: 100.0000 mg | ORAL_CAPSULE | Freq: Two times a day (BID) | ORAL | 0 refills | Status: AC | PRN

## 2019-11-02 MED ORDER — FENTANYL CITRATE (PF) 100 MCG/2ML IJ SOLN
25.0000 ug | INTRAMUSCULAR | Status: DC | PRN
  Administered 2019-11-02 (×2): 25 ug via INTRAVENOUS

## 2019-11-02 MED ORDER — MIDAZOLAM HCL 2 MG/2ML IJ SOLN
INTRAMUSCULAR | Status: DC | PRN
  Administered 2019-11-02: 2 mg via INTRAVENOUS

## 2019-11-02 MED ORDER — FENTANYL CITRATE (PF) 100 MCG/2ML IJ SOLN
INTRAMUSCULAR | Status: DC | PRN
Start: 2019-11-02 — End: 2020-04-16
  Administered 2019-11-02 (×3): 50 ug via INTRAVENOUS

## 2019-11-02 MED ORDER — ROCURONIUM BROMIDE 100 MG/10ML IV SOLN
INTRAVENOUS | Status: DC | PRN
  Administered 2019-11-02 (×3): 20 mg via INTRAVENOUS
  Administered 2019-11-02: 50 mg via INTRAVENOUS

## 2019-11-02 MED ORDER — TRAMADOL HCL 50 MG PO TABS: 50.0000 mg | ORAL_TABLET | Freq: Four times a day (QID) | ORAL | Status: DC | PRN

## 2019-11-02 MED ORDER — PANTOPRAZOLE SODIUM 40 MG PO TBEC: 40.0000 mg | DELAYED_RELEASE_TABLET | Freq: Every day | ORAL | Status: DC

## 2019-11-02 MED ORDER — ACETAMINOPHEN 325 MG PO TABS: 650.0000 mg | ORAL_TABLET | Freq: Three times a day (TID) | ORAL | 0 refills | Status: AC | PRN

## 2019-11-02 MED ORDER — IRBESARTAN 150 MG PO TABS
150.0000 mg | ORAL_TABLET | Freq: Every day | ORAL | Status: DC
  Administered 2019-11-03: 150 mg via ORAL
  Filled 2019-11-02 (×2): qty 1

## 2019-11-02 MED ORDER — PRAVASTATIN SODIUM 20 MG PO TABS
40.0000 mg | ORAL_TABLET | Freq: Every day | ORAL | Status: DC
  Administered 2019-11-02: 40 mg via ORAL
  Filled 2019-11-02: qty 2

## 2019-11-02 MED ORDER — LEFLUNOMIDE 20 MG PO TABS
20.0000 mg | ORAL_TABLET | Freq: Every day | ORAL | Status: DC
  Administered 2019-11-03: 20 mg via ORAL
  Filled 2019-11-02 (×2): qty 1

## 2019-11-02 MED ORDER — INSULIN ASPART 100 UNIT/ML ~~LOC~~ SOLN
0.0000 [IU] | Freq: Three times a day (TID) | SUBCUTANEOUS | Status: DC
  Administered 2019-11-02: 3 [IU] via SUBCUTANEOUS
  Filled 2019-11-02: qty 1

## 2019-11-02 MED ORDER — FENTANYL CITRATE (PF) 100 MCG/2ML IJ SOLN
INTRAMUSCULAR | Status: AC
  Filled 2019-11-02: qty 2

## 2019-11-02 MED ORDER — TIOTROPIUM BROMIDE MONOHYDRATE 18 MCG IN CAPS
1.0000 | ORAL_CAPSULE | Freq: Every day | RESPIRATORY_TRACT | Status: DC
  Filled 2019-11-02: qty 5

## 2019-11-02 MED ORDER — SUMATRIPTAN SUCCINATE 50 MG PO TABS: 50.0000 mg | ORAL_TABLET | ORAL | Status: DC | PRN

## 2019-11-02 MED ORDER — LIDOCAINE VISCOUS HCL 2 % MT SOLN
15.0000 mL | OROMUCOSAL | Status: DC | PRN
  Filled 2019-11-02: qty 15

## 2019-11-02 MED ORDER — AZELASTINE HCL 0.1 % NA SOLN
1.0000 | Freq: Two times a day (BID) | NASAL | Status: DC | PRN
  Filled 2019-11-02: qty 30

## 2019-11-02 MED ORDER — TOPIRAMATE 100 MG PO TABS
100.0000 mg | ORAL_TABLET | Freq: Every day | ORAL | Status: DC
  Administered 2019-11-03: 100 mg via ORAL
  Filled 2019-11-02: qty 1

## 2019-11-02 MED ORDER — EPHEDRINE 5 MG/ML INJ
INTRAVENOUS | Status: AC
  Filled 2019-11-02: qty 10

## 2019-11-02 MED ORDER — PILOCARPINE HCL 5 MG PO TABS: 5.0000 mg | ORAL_TABLET | Freq: Three times a day (TID) | ORAL | Status: DC

## 2019-11-02 MED ORDER — HYDROCODONE-ACETAMINOPHEN 5-325 MG PO TABS
1.0000 | ORAL_TABLET | ORAL | Status: DC | PRN
  Administered 2019-11-02: 1 via ORAL
  Administered 2019-11-02 – 2019-11-03 (×2): 2 via ORAL
  Filled 2019-11-02 (×2): qty 2
  Filled 2019-11-02: qty 1

## 2019-11-02 MED ORDER — PANTOPRAZOLE SODIUM 40 MG PO TBEC
40.0000 mg | DELAYED_RELEASE_TABLET | Freq: Every day | ORAL | Status: DC
  Administered 2019-11-03: 40 mg via ORAL
  Filled 2019-11-02: qty 1

## 2019-11-02 MED ORDER — PROMETHAZINE HCL 25 MG PO TABS: 25.0000 mg | ORAL_TABLET | Freq: Two times a day (BID) | ORAL | Status: DC | PRN

## 2019-11-02 MED ORDER — SODIUM CHLORIDE 0.9 % IV SOLN
2.0000 g | INTRAVENOUS | Status: DC
  Administered 2019-11-02: 2 g via INTRAVENOUS
  Filled 2019-11-02 (×2): qty 20

## 2019-11-02 MED ORDER — BUPIVACAINE HCL 0.5 % IJ SOLN: INTRAMUSCULAR | Status: DC | PRN

## 2019-11-02 MED ORDER — CYCLOBENZAPRINE HCL 10 MG PO TABS
10.0000 mg | ORAL_TABLET | Freq: Every day | ORAL | Status: DC
  Administered 2019-11-02 (×2): 10 mg via ORAL
  Filled 2019-11-02 (×2): qty 1

## 2019-11-02 MED ORDER — DEXAMETHASONE SODIUM PHOSPHATE 10 MG/ML IJ SOLN
INTRAMUSCULAR | Status: DC | PRN
  Administered 2019-11-02: 10 mg via INTRAVENOUS

## 2019-11-02 MED ORDER — FAMOTIDINE-CA CARB-MAG HYDROX 10-800-165 MG PO CHEW: 1.0000 | CHEWABLE_TABLET | Freq: Every day | ORAL | Status: DC | PRN

## 2019-11-02 MED ORDER — ONDANSETRON HCL 4 MG/2ML IJ SOLN
INTRAMUSCULAR | Status: AC
  Filled 2019-11-02: qty 4

## 2019-11-02 MED ORDER — DEXMEDETOMIDINE (PRECEDEX) IN NS 20 MCG/5ML (4 MCG/ML) IV SYRINGE
PREFILLED_SYRINGE | INTRAVENOUS | Status: AC
  Filled 2019-11-02: qty 5

## 2019-11-02 MED ORDER — LACTATED RINGERS IV SOLN: INTRAVENOUS | Status: DC

## 2019-11-02 MED ORDER — IBUPROFEN 800 MG PO TABS: 800.0000 mg | ORAL_TABLET | Freq: Three times a day (TID) | ORAL | 0 refills | Status: DC | PRN

## 2019-11-02 MED ORDER — SUGAMMADEX SODIUM 500 MG/5ML IV SOLN
INTRAVENOUS | Status: DC | PRN
  Administered 2019-11-02: 480 mg via INTRAVENOUS

## 2019-11-02 MED ORDER — SODIUM CHLORIDE 0.9 % IV SOLN
INTRAVENOUS | Status: DC | PRN
  Administered 2019-11-02: 25 ug/min via INTRAVENOUS

## 2019-11-02 MED ORDER — PHENYLEPHRINE HCL (PRESSORS) 10 MG/ML IV SOLN
INTRAVENOUS | Status: DC | PRN
  Administered 2019-11-02 (×2): 200 ug via INTRAVENOUS
  Administered 2019-11-02 (×2): 100 ug via INTRAVENOUS
  Administered 2019-11-02: 200 ug via INTRAVENOUS

## 2019-11-02 MED ORDER — GUAIFENESIN ER 600 MG PO TB12: 1200.0000 mg | ORAL_TABLET | Freq: Two times a day (BID) | ORAL | Status: DC | PRN

## 2019-11-02 MED ORDER — DEXAMETHASONE SODIUM PHOSPHATE 10 MG/ML IJ SOLN
INTRAMUSCULAR | Status: AC
  Filled 2019-11-02: qty 2

## 2019-11-02 MED ORDER — ACETAMINOPHEN 10 MG/ML IV SOLN
INTRAVENOUS | Status: DC | PRN
  Administered 2019-11-02: 1000 mg via INTRAVENOUS

## 2019-11-02 MED ORDER — GLYCOPYRROLATE 0.2 MG/ML IJ SOLN
INTRAMUSCULAR | Status: DC | PRN
  Administered 2019-11-02: .2 mg via INTRAVENOUS

## 2019-11-02 MED ORDER — LIDOCAINE HCL (PF) 2 % IJ SOLN
INTRAMUSCULAR | Status: AC
  Filled 2019-11-02: qty 5

## 2019-11-02 MED ORDER — ONDANSETRON 4 MG PO TBDP: 4.0000 mg | ORAL_TABLET | Freq: Four times a day (QID) | ORAL | Status: DC | PRN

## 2019-11-02 SURGICAL SUPPLY — 73 items
ADH SKN CLS APL DERMABOND .7 (GAUZE/BANDAGES/DRESSINGS) ×1
ANCHOR TIS RET SYS 235ML (MISCELLANEOUS) ×2 IMPLANT
APL PRP STRL LF DISP 70% ISPRP (MISCELLANEOUS) ×1
BAG INFUSER PRESSURE 100CC (MISCELLANEOUS) ×1 IMPLANT
BAG TISS RTRVL C235 10X14 (MISCELLANEOUS) ×1
BLADE SURG SZ11 CARB STEEL (BLADE) ×2 IMPLANT
CANISTER SUCT 1200ML W/VALVE (MISCELLANEOUS) ×2 IMPLANT
CANNULA REDUC XI 12-8 STAPL (CANNULA) ×2
CANNULA REDUCER 12-8 DVNC XI (CANNULA) ×1 IMPLANT
CATH REDDICK CHOLANGI 4FR 50CM (CATHETERS) IMPLANT
CHLORAPREP W/TINT 26 (MISCELLANEOUS) ×2 IMPLANT
CLIP VESOLOCK MED LG 6/CT (CLIP) ×2 IMPLANT
COVER TIP SHEARS 8 DVNC (MISCELLANEOUS) ×1 IMPLANT
COVER TIP SHEARS 8MM DA VINCI (MISCELLANEOUS) ×2
COVER WAND RF STERILE (DRAPES) ×2 IMPLANT
DECANTER SPIKE VIAL GLASS SM (MISCELLANEOUS) ×4 IMPLANT
DEFOGGER SCOPE WARMER CLEARIFY (MISCELLANEOUS) ×2 IMPLANT
DERMABOND ADVANCED (GAUZE/BANDAGES/DRESSINGS) ×1
DERMABOND ADVANCED .7 DNX12 (GAUZE/BANDAGES/DRESSINGS) ×1 IMPLANT
DRAPE ARM DVNC X/XI (DISPOSABLE) ×4 IMPLANT
DRAPE C-ARM XRAY 36X54 (DRAPES) IMPLANT
DRAPE COLUMN DVNC XI (DISPOSABLE) ×1 IMPLANT
DRAPE DA VINCI XI ARM (DISPOSABLE) ×8
DRAPE DA VINCI XI COLUMN (DISPOSABLE) ×2
DRSG TEGADERM 4X4.75 (GAUZE/BANDAGES/DRESSINGS) ×1 IMPLANT
ELECT CAUTERY BLADE 6.4 (BLADE) ×2 IMPLANT
ELECT REM PT RETURN 9FT ADLT (ELECTROSURGICAL) ×2
ELECTRODE REM PT RTRN 9FT ADLT (ELECTROSURGICAL) ×1 IMPLANT
GLOVE BIOGEL PI IND STRL 7.0 (GLOVE) ×2 IMPLANT
GLOVE BIOGEL PI INDICATOR 7.0 (GLOVE) ×2
GLOVE SURG SYN 6.5 ES PF (GLOVE) ×4 IMPLANT
GLOVE SURG SYN 6.5 PF PI (GLOVE) ×2 IMPLANT
GOWN STRL REUS W/ TWL LRG LVL3 (GOWN DISPOSABLE) ×3 IMPLANT
GOWN STRL REUS W/TWL LRG LVL3 (GOWN DISPOSABLE) ×6
GRASPER SUT TROCAR 14GX15 (MISCELLANEOUS) IMPLANT
IRRIGATOR SUCT 8 DISP DVNC XI (IRRIGATION / IRRIGATOR) IMPLANT
IRRIGATOR SUCTION 8MM XI DISP (IRRIGATION / IRRIGATOR) ×2
IV NS 1000ML (IV SOLUTION)
IV NS 1000ML BAXH (IV SOLUTION) IMPLANT
JACKSON PRATT 10 (INSTRUMENTS) ×1 IMPLANT
LABEL OR SOLS (LABEL) ×2 IMPLANT
NDL INSUFFLATION 14GA 120MM (NEEDLE) ×1 IMPLANT
NEEDLE HYPO 22GX1.5 SAFETY (NEEDLE) ×2 IMPLANT
NEEDLE INSUFFLATION 14GA 120MM (NEEDLE) ×2 IMPLANT
NS IRRIG 500ML POUR BTL (IV SOLUTION) ×2 IMPLANT
OBTURATOR OPTICAL STANDARD 8MM (TROCAR) ×2
OBTURATOR OPTICAL STND 8 DVNC (TROCAR) ×1
OBTURATOR OPTICALSTD 8 DVNC (TROCAR) ×1 IMPLANT
PACK LAP CHOLECYSTECTOMY (MISCELLANEOUS) ×2 IMPLANT
PENCIL ELECTRO HAND CTR (MISCELLANEOUS) ×2 IMPLANT
RELOAD STAPLE 45 2.0 GRY DVNC (STAPLE) IMPLANT
RELOAD STAPLE 45 3.5 BLU DVNC (STAPLE) IMPLANT
RELOAD STAPLER 3.5X45 BLU DVNC (STAPLE) ×1 IMPLANT
SEAL CANN UNIV 5-8 DVNC XI (MISCELLANEOUS) ×3 IMPLANT
SEAL XI 5MM-8MM UNIVERSAL (MISCELLANEOUS) ×6
SET TUBE SMOKE EVAC HIGH FLOW (TUBING) ×2 IMPLANT
SOLUTION ELECTROLUBE (MISCELLANEOUS) ×2 IMPLANT
SPONGE DRAIN TRACH 4X4 STRL 2S (GAUZE/BANDAGES/DRESSINGS) ×1 IMPLANT
STAPLE RELOAD 45 2.0 GRAY (STAPLE)
STAPLE RELOAD 45 2.0 GRAY DVNC (STAPLE) IMPLANT
STAPLER 45 DA VINCI SURE FORM (STAPLE) ×2
STAPLER 45 SUREFORM DVNC (STAPLE) IMPLANT
STAPLER CANNULA SEAL DVNC XI (STAPLE) ×1 IMPLANT
STAPLER CANNULA SEAL XI (STAPLE) ×2
STAPLER RELOAD 3.5X45 BLU DVNC (STAPLE) ×1
STAPLER RELOAD 3.5X45 BLUE (STAPLE) ×2
SUT MNCRL 4-0 (SUTURE) ×4
SUT MNCRL 4-0 27XMFL (SUTURE) ×2
SUT VIC AB 3-0 SH 27 (SUTURE) ×2
SUT VIC AB 3-0 SH 27X BRD (SUTURE) IMPLANT
SUT VICRYL 0 AB UR-6 (SUTURE) ×3 IMPLANT
SUTURE MNCRL 4-0 27XMF (SUTURE) ×2 IMPLANT
SYR 30ML LL (SYRINGE) IMPLANT

## 2019-11-02 NOTE — Op Note (Signed)
Preoperative diagnosis:  acute and cholecystitis  Postoperative diagnosis: same as above  Procedure: Robotic assisted Laparoscopic Cholecystectomy.   Anesthesia: GETA   Surgeon: Benjamine Sprague  Specimen: Gallbladder  Complications: None  EBL: 2mL  Wound Classification: Clean Contaminated  Indications: see HPI  Findings: Gallbladder infundibulum with tapering towards duct noted, however the cystic duct was unable to be completely dissected free.  See below for further details.  Adequate hemostasis  Description of procedure:  The patient was placed on the operating table in the supine position. SCDs placed, pre-op abx administered.  General anesthesia was induced and OG tube placed by anesthesia. A time-out was completed verifying correct patient, procedure, site, positioning, and implant(s) and/or special equipment prior to beginning this procedure. The abdomen was prepped and draped in the usual sterile fashion.    Veress needle was placed at the Palmer's point and insufflation was started after confirming a positive saline drop test and no immediate increase in abdominal pressure.  After reaching 15 mm, the Veress needle was removed and a 8 mm port was placed via optiview technique under umbilicus measured 62BJ from gallbladder.  The abdomen was inspected and no abnormalities or injuries were found.  Under direct vision, ports were placed in the following locations: One 12 mm patient left of the umbilicus, 8cm from the optiviewed port, one 8 mm port placed to the patient right of the umbilical port 8 cm apart.  1 additional 8 mm port placed lateral to the 61mm port.  Once ports were placed, The table was placed in the reverse Trendelenburg position with the right side up. The Xi platform was brought into the operative field and docked to the ports successfully.  An endoscope was placed through the umbilical port, fenestrated grasper through the adjacent patient right port, prograsp to the  far patient left port, and then a hook cautery in the left port.  Extensively thickened gallbladder noted.  The dome of the gallbladder was grasped with prograsp, passed and retracted over the dome of the liver. Adhesions between the gallbladder and omentum, duodenum and transverse colon were lysed via hook cautery. The infundibulum was grasped with the fenestrated grasper and retracted toward the right lower quadrant. This maneuver exposed Calot's triangle. The very thick peritoneum overlying the gallbladder infundibulum was then meticulously dissected using combination of [place] dissector and electrocautery hook and the cystic artery gallbladder infundibulum eventually identified.    Cystic artery clipped and divided close to the gallbladder and further dissection to isolate the cystic duct attempted.  However due to the extensive inflammation, the duct was unable to be completely isolated from the surrounding tissue including the common bile duct which it was densely adhered.  Decision was made at this point to use a stapler to transect at the level of the infundibulum to avoid any injury to the common bile duct.  45 mm blue load stapler was then inserted and used to transect the infundibulum as close to the cystic duct as possible.  The gallbladder was then dissected from its peritoneal and liver bed attachments by electrocautery. Hemostasis was checked prior to removing the hook cautery and the Endo Catch bag was then placed through the 12 mm port and the gallbladder was removed. A 10 mm JP drain placed through the right lower quadrant port due to the extensive dissection and inflamed tissue throughout the entire procedure.  The gallbladder was passed off the table as a specimen. There was no evidence of bleeding from the gallbladder fossa  or cystic artery or leakage of the bile from the cystic duct stump. The 12 mm port site that was extended to accommodate removal of the very thick and large  gallbladder was closed using 0 Vicryl under direct visualization. Abdomen desufflated and secondary trocars were removed under direct vision. No bleeding was noted. All skin incisions then closed with subcuticular sutures of 4-0 monocryl and dressed with topical skin adhesive. The orogastric tube was removed and patient extubated.  The patient tolerated the procedure well and was taken to the postanesthesia care unit in stable condition.  All sponge and instrument count correct at end of procedure.

## 2019-11-02 NOTE — Anesthesia Preprocedure Evaluation (Signed)
Anesthesia Evaluation  Patient identified by MRN, date of birth, ID band  History of Anesthesia Complications (+) PONV, DIFFICULT AIRWAY and history of anesthetic complications (had trach at age 64 for tonsilitis, was told had difficult intubation during a subsequent procedure years later, ?tracheal narrowing)  Airway Mallampati: III  TM Distance: >3 FB Neck ROM: Full    Dental  (+) Implants   Pulmonary asthma , neg sleep apnea, former smoker,    breath sounds clear to auscultation- rhonchi (-) wheezing      Cardiovascular Exercise Tolerance: Good hypertension, Pt. on medications (-) CAD, (-) Past MI, (-) Cardiac Stents and (-) CABG  Rhythm:Regular Rate:Normal - Systolic murmurs and - Diastolic murmurs    Neuro/Psych  Headaches, neg Seizures negative psych ROS   GI/Hepatic Neg liver ROS, hiatal hernia, GERD  ,  Endo/Other  diabetesHypothyroidism   Renal/GU negative Renal ROS     Musculoskeletal  (+) Arthritis ,   Abdominal (+) + obese,   Peds  Hematology negative hematology ROS (+)   Anesthesia Other Findings Past Medical History: 02/2019: Cancer ([place])     Comment:  right breast IDC No date: Cataracts, bilateral     Comment:  tbd surgery 2355 No date: Complication of anesthesia No date: Cough No date: Difficult intubation     Comment:  TRACH AT 19 No date: Dyspnea     Comment:  W/ COUGHING No date: Family history of breast cancer No date: Family history of lung cancer No date: Family history of prostate cancer No date: Family history of skin cancer No date: GERD (gastroesophageal reflux disease) No date: Headache No date: High cholesterol No date: History of hiatal hernia No date: Hypertension No date: Hypothyroidism No date: Irritable bowel No date: Osteoarthritis     Comment:  i.e right knee  No date: PONV (postoperative nausea and vomiting) No date: Pre-diabetes No date: Rheumatoid arthritis (HCC) No  date: Thyroid disease   Reproductive/Obstetrics                             Anesthesia Physical Anesthesia Plan  ASA: III  Anesthesia Plan: General   Post-op Pain Management:    Induction: Intravenous  PONV Risk Score and Plan: 3 and Ondansetron, Dexamethasone and Midazolam  Airway Management Planned: Oral ETT and Video Laryngoscope Planned  Additional Equipment:   Intra-op Plan:   Post-operative Plan: Extubation in OR  Informed Consent: I have reviewed the patients History and Physical, chart, labs and discussed the procedure including the risks, benefits and alternatives for the proposed anesthesia with the patient or authorized representative who has indicated his/her understanding and acceptance.     Dental advisory given  Plan Discussed with: CRNA and Anesthesiologist  Anesthesia Plan Comments:         Anesthesia Quick Evaluation

## 2019-11-02 NOTE — Interval H&P Note (Signed)
History and Physical Interval Note:  11/02/2019 11:06 AM  Cheryl Knight  has presented today for surgery, with the diagnosis of Gallbladder Removal.  The various methods of treatment have been discussed with the patient and family. After consideration of risks, benefits and other options for treatment, the patient has consented to  Procedure(s): XI ROBOTIC [place] (N/A) as a surgical intervention.  The patient's history has been reviewed, patient examined, no change in status, stable for surgery.  I have reviewed the patient's chart and labs.  Questions were answered to the patient's satisfaction.     [name]

## 2019-11-02 NOTE — ED Notes (Signed)
ED TO INPATIENT HANDOFF REPORT  ED Nurse Name and Phone #: Valetta Fuller [phone]  S Name/Age/Gender Cheryl Knight 64 y.o. female Room/Bed: ED33A/ED33A  Code Status   Code Status: Full Code  Home/SNF/Other Home Patient oriented to: self, place, time and situation Is this baseline? Yes   Triage Complete: Triage complete  Chief Complaint Acute cholecystitis [K81.0]  Triage Note Pt in via POV, reports RUQ pain with fever since Wednesday.  Also reports N/V/D.  Pt appears uncomfortable.      Allergies Allergies  Allergen Reactions  . Pentazocine Lactate Other (See Comments)    Hallucinations  . Demerol [Meperidine] Nausea And Vomiting  . Lisinopril Cough    Level of Care/Admitting Diagnosis ED Disposition    ED Disposition Condition Comment   Admit  Hospital Area: [facility] [[id number]]  Level of Care: Med-Surg [16]  Covid Evaluation: Confirmed COVID Negative  Diagnosis: Acute cholecystitis [575.0.ICD-9-CM]  Admitting Physician: Benjamine Sprague [[id number]]  Attending Physician: Benjamine Sprague [[id number]]  Estimated length of stay: past midnight tomorrow  Certification:: I certify this patient will need inpatient services for at least 2 midnights       B Medical/Surgery History Past Medical History:  Diagnosis Date  . Cancer ([place]) 02/2019   right breast IDC  . Cataracts, bilateral    tbd surgery 2021  . Complication of anesthesia   . Cough   . Difficult intubation    TRACH AT 21  . Dyspnea    W/ COUGHING  . Family history of breast cancer   . Family history of lung cancer   . Family history of prostate cancer   . Family history of skin cancer   . GERD (gastroesophageal reflux disease)   . Headache   . High cholesterol   . History of hiatal hernia   . Hypertension   . Hypothyroidism   . Irritable bowel   . Osteoarthritis    i.e right knee   . PONV (postoperative nausea and vomiting)   . Pre-diabetes   . Rheumatoid arthritis ([place])   .  Thyroid disease    Past Surgical History:  Procedure Laterality Date  . ABDOMINAL HYSTERECTOMY     age 56 ovaries out  . BREAST LUMPECTOMY WITH RADIOACTIVE SEED AND SENTINEL LYMPH NODE BIOPSY Right 03/28/2019   Procedure: RIGHT BREAST LUMPECTOMY WITH RADIOACTIVE SEED AND SENTINEL LYMPH NODE BIOPSY;  Surgeon: Donnie Mesa, MD;  Location: [facility];  Service: General;  Laterality: Right;  PEC BLOCK FOR POST OP PAIN  . BREAST SURGERY    . CATARACT EXTRACTION, BILATERAL    . ESOPHAGEAL DILATION     Dr. Olevia Perches 15 years from 2021  . TONSILLECTOMY    . TUBAL LIGATION       A IV Location/Drains/Wounds Patient Lines/Drains/Airways Status    Active Line/Drains/Airways    Name Placement date Placement time Site Days   Peripheral IV 11/01/19 Left Hand 11/01/19  1407  Hand  1   Incision (Closed) 03/28/19 Breast Right 03/28/19  1010   219   Incision (Closed) 03/28/19 Axilla Right 03/28/19  1010   219          Intake/Output Last 24 hours  Intake/Output Summary (Last 24 hours) at 11/02/2019 0023 Last data filed at 11/01/2019 1849 Gross per 24 hour  Intake 1845 ml  Output --  Net 1845 ml    Labs/Imaging Results for orders placed or performed during the hospital encounter of 11/01/19 (from the past 48 hour(s))  Lipase, blood     Status: None   Collection Time: 11/01/19  1:57 PM  Result Value Ref Range   Lipase 27 11 - 51 U/L    Comment: Performed at [facility], [address] [place], [place] [address]  Comprehensive metabolic panel     Status: Abnormal   Collection Time: 11/01/19  1:57 PM  Result Value Ref Range   Sodium 133 (L) 135 - 145 mmol/L   Potassium 3.3 (L) 3.5 - 5.1 mmol/L   Chloride 99 98 - 111 mmol/L   CO2 22 22 - 32 mmol/L   Glucose, Bld 124 (H) 70 - 99 mg/dL    Comment: Glucose reference range applies only to samples taken after fasting for at least 8 hours.   BUN 14 8 - 23 mg/dL   Creatinine, Ser 0.99 0.44 - 1.00 mg/dL   Calcium 9.1  8.9 - 10.3 mg/dL   Total Protein 7.7 6.5 - 8.1 g/dL   Albumin 3.7 3.5 - 5.0 g/dL   AST 18 15 - 41 U/L   ALT 12 0 - 44 U/L   Alkaline Phosphatase 41 38 - 126 U/L   Total Bilirubin 0.9 0.3 - 1.2 mg/dL   GFR, Estimated 59 (L) >60 mL/min   Anion gap 12 5 - 15    Comment: Performed at [facility], [address] [place], [place] [address]  CBC     Status: Abnormal   Collection Time: 11/01/19  1:57 PM  Result Value Ref Range   WBC 13.5 (H) 4.0 - 10.5 K/uL   RBC 4.40 3.87 - 5.11 MIL/uL   Hemoglobin 13.4 12.0 - 15.0 g/dL   HCT 40.1 36 - 46 %   MCV 91.1 80.0 - 100.0 fL   MCH 30.5 26.0 - 34.0 pg   MCHC 33.4 30.0 - 36.0 g/dL   RDW 13.0 11.5 - 15.5 %   Platelets 318 150 - 400 K/uL   nRBC 0.0 0.0 - 0.2 %    Comment: Performed at [facility], [address] [place], [place] [address]  Urinalysis, Complete w Microscopic Urine, Clean Catch     Status: Abnormal   Collection Time: 11/01/19  1:57 PM  Result Value Ref Range   Color, Urine AMBER (A) YELLOW    Comment: BIOCHEMICALS MAY BE AFFECTED BY COLOR   APPearance CLOUDY (A) CLEAR   Specific Gravity, Urine 1.025 1.005 - 1.030   pH 5.0 5.0 - 8.0   Glucose, UA NEGATIVE NEGATIVE mg/dL   Hgb urine dipstick NEGATIVE NEGATIVE   Bilirubin Urine SMALL (A) NEGATIVE   Ketones, ur 5 (A) NEGATIVE mg/dL   Protein, ur >=300 (A) NEGATIVE mg/dL   Nitrite NEGATIVE NEGATIVE   Leukocytes,Ua NEGATIVE NEGATIVE   RBC / HPF 0-5 0 - 5 RBC/hpf   WBC, UA 21-50 0 - 5 WBC/hpf   Bacteria, UA RARE (A) NONE SEEN   Squamous Epithelial / LPF 0-5 0 - 5   Mucus PRESENT    Hyaline Casts, UA PRESENT     Comment: Performed at [facility], [address] [place], [place] [address]  Lactic acid, plasma     Status: None   Collection Time: 11/01/19  1:57 PM  Result Value Ref Range   Lactic Acid, Venous 1.2 0.5 - 1.9 mmol/L    Comment: Performed at [facility], [address] [place], [place] [address]  Respiratory Panel by RT  PCR (Flu A&B, Covid) - Nasopharyngeal Swab  Status: None   Collection Time: 11/01/19  4:18 PM   Specimen: Nasopharyngeal Swab  Result Value Ref Range   SARS Coronavirus 2 by RT PCR NEGATIVE NEGATIVE    Comment: (NOTE) SARS-CoV-2 target nucleic acids are NOT DETECTED.  The SARS-CoV-2 RNA is generally detectable in upper respiratoy specimens during the acute phase of infection. The lowest concentration of SARS-CoV-2 viral copies this assay can detect is 131 copies/mL. A negative result does not preclude SARS-Cov-2 infection and should not be used as the sole basis for treatment or other patient management decisions. A negative result may occur with  improper specimen collection/handling, submission of specimen other than nasopharyngeal swab, presence of viral mutation(s) within the areas targeted by this assay, and inadequate number of viral copies (<131 copies/mL). A negative result must be combined with clinical observations, patient history, and epidemiological information. The expected result is Negative.  Fact Sheet for Patients:  PinkCheek.be  Fact Sheet for Healthcare Providers:  GravelBags.it  This test is no t yet approved or cleared by the [place] FDA and  has been authorized for detection and/or diagnosis of SARS-CoV-2 by FDA under an Emergency Use Authorization (EUA). This EUA will remain  in effect (meaning this test can be used) for the duration of the COVID-19 declaration under Section 564(b)(1) of the Act, 21 U.S.C. section 360bbb-3(b)(1), unless the authorization is terminated or revoked sooner.     Influenza A by PCR NEGATIVE NEGATIVE   Influenza B by PCR NEGATIVE NEGATIVE    Comment: (NOTE) The Xpert Xpress SARS-CoV-2/FLU/RSV assay is intended as an aid in  the diagnosis of influenza from Nasopharyngeal swab specimens and  should not be used as a sole basis for treatment. Nasal washings and   aspirates are unacceptable for Xpert Xpress SARS-CoV-2/FLU/RSV  testing.  Fact Sheet for Patients: PinkCheek.be  Fact Sheet for Healthcare Providers: GravelBags.it  This test is not yet approved or cleared by the [place] FDA and  has been authorized for detection and/or diagnosis of SARS-CoV-2 by  FDA under an Emergency Use Authorization (EUA). This EUA will remain  in effect (meaning this test can be used) for the duration of the  Covid-19 declaration under Section 564(b)(1) of the Act, 21  U.S.C. section 360bbb-3(b)(1), unless the authorization is  terminated or revoked. Performed at [facility], [address] [place], [place] [address]   Glucose, capillary     Status: Abnormal   Collection Time: 11/01/19 10:06 PM  Result Value Ref Range   Glucose-Capillary 103 (H) 70 - 99 mg/dL    Comment: Glucose reference range applies only to samples taken after fasting for at least 8 hours.   US ABDOMEN LIMITED RUQ  Result Date: 11/01/2019 CLINICAL DATA:  Right upper quadrant pain for 2 days EXAM: ULTRASOUND ABDOMEN LIMITED RIGHT UPPER QUADRANT COMPARISON:  None. FINDINGS: Gallbladder: Wall thickening is noted to 5.4 mm. Positive sonographic Murphy's sign is seen. Mild gallbladder sludge is noted. No definitive calculi are seen. Common bile duct: Diameter: 4.1 mm. Liver: Mildly heterogeneous likely related to fatty infiltration. No focal mass is seen. Portal vein is patent on color Doppler imaging with normal direction of blood flow towards the liver. Other: None. IMPRESSION: Dilated gallbladder with wall thickening and positive sonographic Murphy's sign. These findings suggest acute cholecystitis. No calculi are seen although some gallbladder sludge is noted. Electronically Signed   By: Inez Catalina M.D.   On: 11/01/2019 15:26    Pending Labs Unresulted Labs (From admission, onward)  Start     Ordered    11/02/19 2025  Basic metabolic panel  Daily,   STAT      11/02/19 0021   11/02/19 0500  Magnesium  Daily,   STAT      11/02/19 0021   11/02/19 0500  Phosphorus  Daily,   STAT      11/02/19 0021   11/02/19 0500  CBC  Daily,   STAT      11/02/19 0021   11/02/19 0500  Hepatic function panel  Daily,   STAT      11/02/19 0021   11/02/19 0500  Magnesium  Daily,   STAT      11/02/19 0021   11/02/19 0500  Phosphorus  Daily,   STAT      11/02/19 0021   11/02/19 0022  HIV Antibody (routine testing w rflx)  (HIV Antibody (Routine testing w reflex) panel)  Once,   STAT        11/02/19 0021          Vitals/Pain Today's Vitals   11/01/19 2000 11/01/19 2119 11/01/19 2126 11/01/19 2330  BP: 113/65 103/60    Pulse: 95 97    Resp: 18 16    Temp:  99.4 F (37.4 C)    TempSrc:  Oral    SpO2: 97% 97%    Weight:      Height:      PainSc:  0-No pain 0-No pain 0-No pain    Isolation Precautions No active isolations  Medications Medications  leflunomide (ARAVA) tablet 20 mg (has no administration in time range)  SUMAtriptan (IMITREX) tablet 50 mg (has no administration in time range)  levothyroxine (SYNTHROID) tablet 100 mcg (has no administration in time range)  cyclobenzaprine (FLEXERIL) tablet 10 mg (has no administration in time range)  promethazine (PHENERGAN) tablet 25 mg (has no administration in time range)  olmesartan-hydrochlorothiazide (BENICAR HCT) 20-12.5 MG per tablet 1 tablet (has no administration in time range)  pravastatin (PRAVACHOL) tablet 40 mg (has no administration in time range)  tamoxifen (NOLVADEX) tablet 20 mg (has no administration in time range)  glucosamine-chondroitin 500-400 MG tablet 2 tablet (has no administration in time range)  cycloSPORINE (RESTASIS) 0.05 % ophthalmic emulsion 1 drop (has no administration in time range)  topiramate (TOPAMAX) tablet 100 mg (has no administration in time range)  azelastine (ASTELIN) 0.1 % nasal spray 1 spray (has no  administration in time range)  pantoprazole (PROTONIX) EC tablet 40 mg (has no administration in time range)  Tiotropium Bromide Monohydrate AERS 2 puff (has no administration in time range)  multivitamin with minerals tablet 1 tablet (has no administration in time range)  calcium carbonate (TUMS EX) chewable tablet 750 mg (has no administration in time range)  famotidine-calcium carbonate-magnesium hydroxide (PEPCID COMPLETE) 10-800-165 MG chewable tablet 1 tablet (has no administration in time range)  Guaifenesin TB12 1,200 mg (has no administration in time range)  lidocaine (XYLOCAINE) 2 % viscous mouth solution 15 mL (has no administration in time range)  pantoprazole (PROTONIX) EC tablet 40 mg (has no administration in time range)  pilocarpine (SALAGEN) tablet 5 mg (has no administration in time range)  traMADol (ULTRAM) tablet 50 mg (has no administration in time range)  HYDROcodone-acetaminophen (NORCO/VICODIN) 5-325 MG per tablet 1-2 tablet (has no administration in time range)  morphine 2 MG/ML injection 2 mg (has no administration in time range)  docusate sodium (COLACE) capsule 100 mg (has no administration in time range)  ondansetron (ZOFRAN-ODT) disintegrating  tablet 4 mg (has no administration in time range)    Or  ondansetron (ZOFRAN) injection 4 mg (has no administration in time range)  lactated ringers infusion (has no administration in time range)  cefTRIAXone (ROCEPHIN) 2 g in sodium chloride 0.9 % 100 mL IVPB (has no administration in time range)  insulin aspart (novoLOG) injection 0-15 Units (has no administration in time range)  sodium chloride 0.9 % bolus 1,000 mL (0 mLs Intravenous Stopped 11/01/19 1849)  morphine 4 MG/ML injection 4 mg (4 mg Intravenous Given 11/01/19 1702)    Mobility walks Low fall risk   Focused Assessments Cardiac Assessment Handoff:    Lab Results  Component Value Date   CKTOTAL 70 01/20/2006   No results found for: DDIMER Does the  Patient currently have chest pain? No     R Recommendations: See Admitting Provider Note  Report given to:   Additional Notes: Independent, walkie-talkie, ambulates to bathroom, has a 22(L) hand.

## 2019-11-02 NOTE — Anesthesia Procedure Notes (Addendum)
Procedure Name: Intubation Performed by: Kelton Pillar, CRNA Pre-anesthesia Checklist: Patient identified, Emergency Drugs available, Suction available and Patient being monitored Patient Re-evaluated:Patient Re-evaluated prior to induction Oxygen Delivery Method: Circle system utilized Preoxygenation: Pre-oxygenation with 100% oxygen Induction Type: IV induction Ventilation: Mask ventilation without difficulty Laryngoscope Size: McGraph and 3 Grade View: Grade I Tube type: Oral Tube size: 6.0 mm Number of attempts: 1 Airway Equipment and Method: Stylet and Oral airway Placement Confirmation: ETT inserted through vocal cords under direct vision,  positive ETCO2,  breath sounds checked- equal and bilateral and CO2 detector Secured at: 21 cm Tube secured with: Tape Dental Injury: Teeth and Oropharynx as per pre-operative assessment  Difficulty Due To: Difficulty was anticipated Comments: 6.0 ETT elective, pt w/ hx trach. TFH

## 2019-11-02 NOTE — ED Notes (Signed)
PT to OR at this time

## 2019-11-03 LAB — BASIC METABOLIC PANEL
Anion gap: 9 (ref 5–15)
BUN: 9 mg/dL (ref 8–23)
CO2: 26 mmol/L (ref 22–32)
Calcium: 8.5 mg/dL — ABNORMAL LOW (ref 8.9–10.3)
Chloride: 106 mmol/L (ref 98–111)
Creatinine, Ser: 0.59 mg/dL (ref 0.44–1.00)
GFR, Estimated: >60 mL/min (ref >60)
Glucose, Bld: 124 mg/dL — ABNORMAL HIGH (ref 70–99)
Potassium: 3.7 mmol/L (ref 3.5–5.1)
Sodium: 141 mmol/L (ref 135–145)

## 2019-11-03 LAB — MAGNESIUM: Magnesium: 2.3 mg/dL (ref 1.7–2.4)

## 2019-11-03 LAB — HEPATIC FUNCTION PANEL
ALT: 11 U/L (ref 0–44)
AST: 23 U/L (ref 15–41)
Albumin: 2.9 g/dL — ABNORMAL LOW (ref 3.5–5.0)
Alkaline Phosphatase: 37 U/L — ABNORMAL LOW (ref 38–126)
Bilirubin, Direct: <0.1 mg/dL (ref 0.0–0.2)
Total Bilirubin: 0.4 mg/dL (ref 0.3–1.2)
Total Protein: 6.4 g/dL — ABNORMAL LOW (ref 6.5–8.1)

## 2019-11-03 LAB — CBC
HCT: 34.4 % — ABNORMAL LOW (ref 36.0–46.0)
Hemoglobin: 11.4 g/dL — ABNORMAL LOW (ref 12.0–15.0)
MCH: 30.5 pg (ref 26.0–34.0)
MCHC: 33.1 g/dL (ref 30.0–36.0)
MCV: 92 fL (ref 80.0–100.0)
Platelets: 292 10*3/uL (ref 150–400)
RBC: 3.74 MIL/uL — ABNORMAL LOW (ref 3.87–5.11)
RDW: 12.7 % (ref 11.5–15.5)
WBC: 11.9 10*3/uL — ABNORMAL HIGH (ref 4.0–10.5)
nRBC: 0 % (ref 0.0–0.2)

## 2019-11-03 LAB — GLUCOSE, CAPILLARY: Glucose-Capillary: 102 mg/dL — ABNORMAL HIGH (ref 70–99)

## 2019-11-03 LAB — PHOSPHORUS: Phosphorus: 2 mg/dL — ABNORMAL LOW (ref 2.5–4.6)

## 2019-11-03 MED ORDER — POTASSIUM & SODIUM PHOSPHATES 280-160-250 MG PO PACK
1.0000 | PACK | Freq: Three times a day (TID) | ORAL | Status: DC
  Filled 2019-11-03 (×3): qty 1

## 2019-11-03 NOTE — Progress Notes (Signed)
Discharge instructions given to patient FTF at bedside. Patient verbalized understanding. No further questions or concerns at this time.   Thresa Ross, RN

## 2019-11-03 NOTE — Discharge Summary (Addendum)
Patient ID: Cheryl Knight MRN: [id number] DOB/AGE: [birth year] 64 y.o.  Admit date: 11/01/2019 Discharge date: 11/03/2019   Discharge Diagnoses:  Active Problems:   Acute cholecystitis   Procedures:  Robotic assisted cholecystectomy  Hospital Course:  Patient was admitted on 10/14 with acute cholecystitis and underwent robotic cholecystectomy on 10/15.  POD#1 she has been doing very well, with well controlled pain, no nausea, tolerating diet, with drain having serosanguinous output only.  Will d/c home today in drain in place and close follow up with Dr. Lysle Pearl.  On exam, she's in no acute distress with stable vital signs.  Her abdomen is soft, non-distended, appropriately tender.  Incisions are clean, dry, intact.  JP drain with serosanguinous fluid.   Consults: None  Disposition: Discharge disposition: 01-Home or Self Care       Discharge Instructions    Call MD for:  difficulty breathing, headache or visual disturbances   Complete by: As directed    Call MD for:  persistant nausea and vomiting   Complete by: As directed    Call MD for:  redness, tenderness, or signs of infection (pain, swelling, redness, odor or green/yellow discharge around incision site)   Complete by: As directed    Call MD for:  severe uncontrolled pain   Complete by: As directed    Call MD for:  temperature >100.4   Complete by: As directed    Change dressing (specify)   Complete by: As directed    Drain dressing:  Dry gauze around drain, covered with tape.  Change once daily.   Diet - low sodium heart healthy   Complete by: As directed    Discharge instructions   Complete by: As directed    1.  Patient may shower, but do not scrub wounds heavily and dab dry only. 2.  Do not submerge wounds in pool/tub for 1 week. 3.  Do not apply ointments or hydrogen peroxide to the wounds. 4.  Empty and record drain output daily.  Bring output record with you to office visit.   Discharge patient    Complete by: As directed    Discharge disposition: 01-Home or Self Care   Discharge patient date: 11/02/2019   Driving Restrictions   Complete by: As directed    Do not drive while taking narcotics for pain control.   Increase activity slowly   Complete by: As directed    Lifting restrictions   Complete by: As directed    No heavy lifting or pushing of more than 10-15 lbs for 4 weeks.     Allergies as of 11/03/2019      Reactions   Pentazocine Lactate Other (See Comments)   Hallucinations   Demerol [meperidine] Nausea And Vomiting   Lisinopril Cough      Medication List    STOP taking these medications   aspirin-acetaminophen-caffeine 250-250-65 MG tablet Commonly known as: EXCEDRIN MIGRAINE   Dexilant 30 MG capsule Generic drug: Dexlansoprazole   HYDROcodone-acetaminophen 7.5-325 MG tablet Commonly known as: NORCO Replaced by: HYDROcodone-acetaminophen 5-325 MG tablet   lidocaine 2 % solution Commonly known as: XYLOCAINE   pantoprazole 40 MG tablet Commonly known as: PROTONIX   pilocarpine 5 MG tablet Commonly known as: SALAGEN     TAKE these medications   acetaminophen 325 MG tablet Commonly known as: Tylenol Take 2 tablets (650 mg total) by mouth every 8 (eight) hours as needed for mild pain.   azelastine 0.1 % nasal spray Commonly known as: ASTELIN Place 1  spray into both nostrils 2 (two) times daily.   budesonide-formoterol 160-4.5 MCG/ACT inhaler Commonly known as: SYMBICORT Inhale 1 puff into the lungs daily.   cyclobenzaprine 10 MG tablet Commonly known as: FLEXERIL Take 1 tablet (10 mg total) by mouth daily. What changed: when to take this   docusate sodium 100 MG capsule Commonly known as: Colace Take 1 capsule (100 mg total) by mouth 2 (two) times daily as needed for up to 10 days for mild constipation.   famotidine-calcium carbonate-magnesium hydroxide 10-800-165 MG chewable tablet Commonly known as: PEPCID COMPLETE Chew 1 tablet by  mouth daily as needed (acid reflux).   glucosamine-chondroitin 500-400 MG tablet Take 2 tablets by mouth daily.   Guaifenesin 1200 MG Tb12 Take 1,200 mg by mouth in the morning and at bedtime.   HYDROcodone-acetaminophen 5-325 MG tablet Commonly known as: Norco Take 1 tablet by mouth every 6 (six) hours as needed for up to 6 doses for moderate pain. Replaces: HYDROcodone-acetaminophen 7.5-325 MG tablet   ibuprofen 800 MG tablet Commonly known as: ADVIL Take 1 tablet (800 mg total) by mouth every 8 (eight) hours as needed for mild pain or moderate pain.   leflunomide 20 MG tablet Commonly known as: ARAVA TAKE 1 TABLET(20 MG) BY MOUTH DAILY What changed: See the new instructions.   levothyroxine 100 MCG tablet Commonly known as: SYNTHROID Daily 30 minutes before food What changed:   how much to take  how to take this  when to take this  additional instructions   loratadine 10 MG tablet Commonly known as: CLARITIN Take 10 mg by mouth every other day.   loratadine-pseudoephedrine 10-240 MG 24 hr tablet Commonly known as: CLARITIN-D 24-hour Take 1 tablet by mouth every other day.   multivitamin with minerals Tabs tablet Take 1 tablet by mouth daily.   olmesartan-hydrochlorothiazide 20-12.5 MG tablet Commonly known as: Benicar HCT Take 1 tablet by mouth daily. In am   omeprazole 40 MG capsule Commonly known as: PRILOSEC Take 40 mg by mouth daily.   pravastatin 20 MG tablet Commonly known as: PRAVACHOL Take 20 mg by mouth at bedtime.   promethazine 25 MG tablet Commonly known as: PHENERGAN Take 1 tablet (25 mg total) by mouth 2 (two) times daily as needed. What changed: reasons to take this   Restasis 0.05 % ophthalmic emulsion Generic drug: cycloSPORINE Place 1 drop into both eyes 2 (two) times daily.   sitaGLIPtin 25 MG tablet Commonly known as: Januvia Take 1 tablet (25 mg total) by mouth daily.   Spiriva Respimat 1.25 MCG/ACT Aers Generic drug:  Tiotropium Bromide Monohydrate Inhale 2 puffs into the lungs daily.   SUMAtriptan 50 MG tablet Commonly known as: IMITREX Take 1 tablet (50 mg total) by mouth every 2 (two) hours as needed for migraine.   tamoxifen 20 MG tablet Commonly known as: NOLVADEX Take 1 tablet (20 mg total) by mouth daily.   topiramate 100 MG tablet Commonly known as: TOPAMAX Take 1 tablet (100 mg total) by mouth daily.   TUMS CHEWY BITES PO Take 1 tablet by mouth at bedtime as needed (acid reflux).            Discharge Care Instructions  (From admission, onward)         Start     Ordered   11/03/19 0000  Change dressing (specify)       Comments: Drain dressing:  Dry gauze around drain, covered with tape.  Change once daily.   11/03/19 0957  Follow-up Information    Sakai, Isami, DO Follow up in 1 week(s).   Specialty: Surgery Contact information: [address] [place] [address] [phone]

## 2019-11-05 NOTE — Telephone Encounter (Signed)
Left message to return call 

## 2019-11-05 NOTE — Telephone Encounter (Signed)
Call pt advise we hope she is doing well saw she had her gallbladder removed and I wish she feels better soon

## 2019-11-06 LAB — SURGICAL PATHOLOGY

## 2019-11-06 NOTE — Telephone Encounter (Signed)
Pt called and states that is doing well since surgery and they just removed the drain today.

## 2019-11-06 NOTE — Telephone Encounter (Signed)
For your information  

## 2019-11-08 ENCOUNTER — Encounter (HOSPITAL_COMMUNITY): Payer: Self-pay

## 2019-11-13 ENCOUNTER — Ambulatory Visit: Payer: PPO | Admitting: Pulmonary Disease

## 2019-11-19 ENCOUNTER — Telehealth: Payer: Self-pay | Admitting: Internal Medicine

## 2019-11-19 NOTE — Telephone Encounter (Signed)
Patient called in stated that the other medication januvia for diabetes her insurance will not cover it and would like try metform

## 2019-11-20 ENCOUNTER — Other Ambulatory Visit: Payer: Self-pay | Admitting: Internal Medicine

## 2019-11-20 DIAGNOSIS — E119 Type 2 diabetes mellitus without complications: Secondary | ICD-10-CM

## 2019-11-20 MED ORDER — METFORMIN HCL ER 500 MG PO TB24: 500.0000 mg | ORAL_TABLET | Freq: Every day | ORAL | 3 refills | Status: DC

## 2019-11-20 NOTE — Telephone Encounter (Signed)
Sent metformin xr daily with breakfast to pharmacy

## 2019-11-20 NOTE — Telephone Encounter (Signed)
Please advise 

## 2019-11-22 NOTE — Telephone Encounter (Signed)
Patient informed and verbalized understanding.  She just started this medication today.

## 2019-11-26 NOTE — Anesthesia Postprocedure Evaluation (Signed)
Anesthesia Post Note  Patient: Cheryl Knight  Procedure(s) Performed: XI ROBOTIC ASSISTED LAPAROSCOPIC CHOLECYSTECTOMY (N/A )  Patient location during evaluation: PACU Anesthesia Type: General Level of consciousness: awake and alert Pain management: pain level controlled Vital Signs Assessment: post-procedure vital signs reviewed and stable Respiratory status: spontaneous breathing, nonlabored ventilation, respiratory function stable and patient connected to nasal cannula oxygen Cardiovascular status: blood pressure returned to baseline and stable Postop Assessment: no apparent nausea or vomiting Anesthetic complications: no   No complications documented.   Last Vitals:  Vitals:   11/03/19 0523 11/03/19 0738  BP: 107/61 112/60  Pulse: 90 93  Resp: 20 18  Temp: 36.8 C 36.8 C  SpO2: 95% 96%    Last Pain:  Vitals:   11/03/19 1000  TempSrc:   PainSc: 0-No pain                 Molli Barrows

## 2019-11-29 DIAGNOSIS — L814 Other melanin hyperpigmentation: Secondary | ICD-10-CM | POA: Diagnosis not present

## 2019-11-29 DIAGNOSIS — L821 Other seborrheic keratosis: Secondary | ICD-10-CM | POA: Diagnosis not present

## 2019-11-29 DIAGNOSIS — Z85828 Personal history of other malignant neoplasm of skin: Secondary | ICD-10-CM | POA: Diagnosis not present

## 2019-11-29 DIAGNOSIS — D1801 Hemangioma of skin and subcutaneous tissue: Secondary | ICD-10-CM | POA: Diagnosis not present

## 2019-11-29 DIAGNOSIS — L570 Actinic keratosis: Secondary | ICD-10-CM | POA: Diagnosis not present

## 2019-11-30 DIAGNOSIS — C50911 Malignant neoplasm of unspecified site of right female breast: Secondary | ICD-10-CM | POA: Diagnosis not present

## 2019-12-05 NOTE — Progress Notes (Signed)
Office Visit Note  Patient: Cheryl Knight             Date of Birth: December 03, 1955           MRN: [id number]             PCP: McLean-Scocuzza, Nino Glow, MD Referring: Orland Mustard * Visit Date: 12/18/2019 Occupation: @GUAROCC @  Subjective:  Medication management   History of Present Illness: Cheryl Knight is a 64 y.o. female with history of rheumatoid arthritis, osteoarthritis and interstitial lung disease.  She states she was hospitalized for cholecystectomy November 01, 2019.  She has done well after the cholecystectomy.  She has some diarrhea since then.  She has been taking Arava 20mg  on a daily basis.  She denies having a flare of rheumatoid arthritis.  She states since the weather has become colder she has been having some discomfort in her knee joints.  She has not had any cough since her last visit.  She states she will see pulmonologist will make a decision about further treatment after bronchoscopy.  Activities of Daily Living:  Patient reports morning stiffness for 0 minute.   Patient Denies nocturnal pain.  Difficulty dressing/grooming: Denies Difficulty climbing stairs: Denies Difficulty getting out of chair: Denies Difficulty using hands for taps, buttons, cutlery, and/or writing: Denies  Review of Systems  Constitutional: Negative for fatigue, night sweats, weight gain and weight loss.  HENT: Positive for mouth dryness. Negative for mouth sores, trouble swallowing, trouble swallowing and nose dryness.   Eyes: Positive for dryness. Negative for pain, redness and visual disturbance.  Respiratory: Negative for cough, shortness of breath and difficulty breathing.   Cardiovascular: Negative for chest pain, palpitations, hypertension, irregular heartbeat and swelling in legs/feet.  Gastrointestinal: Positive for diarrhea. Negative for blood in stool and constipation.  Endocrine: Negative for increased urination.  Genitourinary: Negative for vaginal dryness.   Musculoskeletal: Positive for arthralgias and joint pain. Negative for joint swelling, myalgias, muscle weakness, morning stiffness, muscle tenderness and myalgias.  Skin: Negative for color change, rash, hair loss, skin tightness, ulcers and sensitivity to sunlight.  Allergic/Immunologic: Negative for susceptible to infections.  Neurological: Negative for dizziness, memory loss, night sweats and weakness.  Hematological: Negative for swollen glands.  Psychiatric/Behavioral: Negative for depressed mood and sleep disturbance. The patient is not nervous/anxious.     PMFS History:  Patient Active Problem List   Diagnosis Date Noted  . Acute cholecystitis 11/01/2019  . Pulmonary fibrosis ([place]) 10/01/2019  . Pulmonary nodules 10/01/2019  . Bronchiectasis without complication ([place]) 24/23/5361  . Aortic atherosclerosis ([place]) 10/01/2019  . Hypertension associated with diabetes ([place]) 10/01/2019  . Paresthesia of upper extremity 10/01/2019  . Hiatal hernia 08/16/2019  . Esophagitis 08/16/2019  . Persistent cough 08/16/2019  . Dry eye syndrome 08/05/2019  . Glaucoma 08/05/2019  . Hypertensive retinopathy 08/05/2019  . Arteriosclerosis 08/05/2019  . Pterygium 08/05/2019  . Ocular rosacea 08/05/2019  . Genetic testing 05/02/2019  . Family history of breast cancer   . Family history of prostate cancer   . Family history of skin cancer   . Family history of lung cancer   . Malignant neoplasm of lower-outer quadrant of right breast of female, estrogen receptor positive ([place]) 03/19/2019  . Type 2 diabetes mellitus with hyperglycemia, without long-term current use of insulin ([place]) 02/08/2019  . Allergic rhinitis 02/08/2019  . Osteoarthritis   . Primary osteoarthritis involving multiple joints 11/15/2017  . Moderate asthma with acute exacerbation 05/03/2017  . Routine general  medical examination at a health care facility 02/08/2017  . Primary osteoarthritis of both feet 02/10/2016  . Hx of  migraine headaches 02/07/2016  . History of hypothyroidism 02/07/2016  . History of gastroesophageal reflux (GERD) 02/07/2016  . Obesity (BMI 30-39.9) 12/30/2015  . Basal cell carcinoma of chest wall 10/05/2011  . Hypertension 09/01/2010  . Hyperlipidemia 02/27/2008  . Hypothyroidism 02/21/2007  . Migraine 02/21/2007  . VAGINITIS, ATROPHIC 02/21/2007  . Psoriasis 02/21/2007  . Sicca syndrome, unspecified 02/21/2007  . GERD 06/29/2006  . Rheumatoid arthritis ([place]) 06/29/2006    Past Medical History:  Diagnosis Date  . Cancer ([place]) 02/2019   right breast IDC  . Cataracts, bilateral    tbd surgery 2021  . Complication of anesthesia   . Cough   . Difficult intubation    TRACH AT 19  . Dyspnea    W/ COUGHING  . Family history of breast cancer   . Family history of lung cancer   . Family history of prostate cancer   . Family history of skin cancer   . GERD (gastroesophageal reflux disease)   . Headache   . High cholesterol   . History of hiatal hernia   . Hypertension   . Hypothyroidism   . Irritable bowel   . Osteoarthritis    i.e right knee   . PONV (postoperative nausea and vomiting)   . Pre-diabetes   . Rheumatoid arthritis ([place])   . Thyroid disease     Family History  Problem Relation Age of Onset  . Hypertension Mother   . Thyroid disease Mother   . Breast cancer Mother 22       breast dx age 93 died age 6   . Prostate cancer Father 18       metastatic  . Stroke Maternal Grandmother   . Skin cancer Maternal Uncle        dx. in his 54s  . Lung cancer Maternal Uncle        smoker, died age 87   Past Surgical History:  Procedure Laterality Date  . ABDOMINAL HYSTERECTOMY     age 65 ovaries out  . BREAST LUMPECTOMY WITH RADIOACTIVE SEED AND SENTINEL LYMPH NODE BIOPSY Right 03/28/2019   Procedure: RIGHT BREAST LUMPECTOMY WITH RADIOACTIVE SEED AND SENTINEL LYMPH NODE BIOPSY;  Surgeon: Donnie Mesa, MD;  Location: [facility];  Service: General;   Laterality: Right;  PEC BLOCK FOR POST OP PAIN  . BREAST SURGERY    . CATARACT EXTRACTION, BILATERAL    . ESOPHAGEAL DILATION     Dr. Olevia Perches 15 years from 2021  . TONSILLECTOMY    . 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 H1N1 02/27/2008  . Influenza-Unspecified 11/18/2017  . PFIZER SARS-COV-2 Vaccination 03/02/2019, 03/29/2019  . Pneumococcal Conjugate-13 10/03/2019  . Td 01/19/2004  . Tdap 12/17/2014     Objective: Vital Signs: BP 117/79 (BP Location: Left Arm, Patient Position: Sitting, Cuff Size: Small)   Pulse 99   Ht 5\' 8"  (1.727 m)   Wt 195 lb (88.5 kg)   BMI 29.65 kg/m    Physical Exam Vitals and nursing note reviewed.  Constitutional:      Appearance: She is well-developed.  HENT:     Head: Normocephalic and atraumatic.  Eyes:     Conjunctiva/sclera: Conjunctivae normal.  Cardiovascular:     Rate and Rhythm: Normal rate and regular rhythm.     Heart sounds: Normal heart sounds.  Pulmonary:     Effort: Pulmonary effort is normal.     Breath sounds: Normal breath sounds.  Abdominal:     General: Bowel sounds are normal.     Palpations: Abdomen is soft.  Musculoskeletal:     Cervical back: Normal range of motion.  Lymphadenopathy:     Cervical: No cervical adenopathy.  Skin:    General: Skin is warm and dry.     Capillary Refill: Capillary refill takes less than 2 seconds.  Neurological:     Mental Status: She is alert and oriented to person, place, and time.  Psychiatric:        Behavior: Behavior normal.      Musculoskeletal Exam: C-spine thoracic and lumbar spine with good range of motion.  Shoulder joints, elbow joints, wrist joints, MCPs PIPs and DIPs with good range of motion with no synovitis.  Hip joints, knee joints, ankles, MTPs and PIPs with good range of motion with no synovitis.  CDAI Exam: CDAI Score: 0.4   Patient Global: 2 mm; Provider Global: 2 mm Swollen: 0 ; Tender: 0  Joint Exam 12/18/2019   No joint exam has been documented for this visit   There is currently no information documented on the homunculus. Go to the Rheumatology activity and complete the homunculus joint exam.  Investigation: No additional findings.  Imaging: No results found.  Recent Labs: Lab Results  Component Value Date   WBC 11.9 (H) 11/03/2019   HGB 11.4 (L) 11/03/2019   PLT 292 11/03/2019   NA 141 11/03/2019   K 3.7 11/03/2019   CL 106 11/03/2019   CO2 26 11/03/2019   GLUCOSE 124 (H) 11/03/2019   BUN 9 11/03/2019   CREATININE 0.59 11/03/2019   BILITOT 0.4 11/03/2019   ALKPHOS 37 (L) 11/03/2019   AST 23 11/03/2019   ALT 11 11/03/2019   PROT 6.4 (L) 11/03/2019   ALBUMIN 2.9 (L) 11/03/2019   CALCIUM 8.5 (L) 11/03/2019   GFRAA >60 09/04/2019   QFTBGOLDPLUS NEGATIVE 04/05/2019    Speciality Comments: No specialty comments available.  Procedures:  No procedures performed Allergies: Pentazocine lactate, Demerol [meperidine], and Lisinopril   Assessment / Plan:     Visit Diagnoses: Rheumatoid arthritis involving multiple sites with positive rheumatoid factor (HCC) - +RF.  Patient is doing well on [place].  She denies any joint swelling.  She had no synovitis on my examination.  High risk medication use - Arava 20 mg p.o. daily. previously on Methotrexate 0.8 ml every 7 days and folic acid 1 mg 2 tablets daily.  Labs are stable.  She had elevated WBC count at the time of cholecystectomy.  We will check labs again in December.  ILD (interstitial lung disease) ([place]) - diagnosed on the basis of high-resolution CT. patient is followed by Dr. Patsey Berthold.  Patient states since the cholecystectomy the shortness of breath and cough has resolved.  She is asymptomatic currently.  History of asthma - patient has been diagnosed with asthma by Dr. Patsey Berthold.  Psoriasis-she does not have any active lesions  currently.  Primary osteoarthritis of both knees-she has some discomfort with the weather change.  Weight loss diet and exercise was discussed.  Primary osteoarthritis of both feet-proper fitting shoes were discussed.  Sicca syndrome (HCC)-over-the-counter products were discussed.  Other medical problems are listed as follows:  History of hyperlipidemia  History  of hypothyroidism  History of migraine  Malignant neoplasm of lower-outer quadrant of right breast of female, estrogen receptor positive ([place])  History of gastroesophageal reflux (GERD)  Educated about COVID-19 virus infection-patient is fully vaccinated against COVID-19.  Have advised her to get a booster.  Use of mask, social distancing and hand hygiene was discussed.  Orders: No orders of the defined types were placed in this encounter.  No orders of the defined types were placed in this encounter.   Follow-Up Instructions: Return in about 5 months (around 05/17/2020) for Rheumatoid arthritis.   Bo Merino, MD  Note - This record has been created using Editor, commissioning.  Chart creation errors have been sought, but may not always  have been located. Such creation errors do not reflect on  the standard of medical care.

## 2019-12-18 ENCOUNTER — Encounter: Payer: Self-pay | Admitting: Rheumatology

## 2019-12-18 ENCOUNTER — Other Ambulatory Visit: Payer: Self-pay

## 2019-12-18 ENCOUNTER — Ambulatory Visit: Payer: PPO | Admitting: Rheumatology

## 2019-12-18 VITALS — BP 117/79 | HR 99 | Ht 68.0 in | Wt 195.0 lb

## 2019-12-18 DIAGNOSIS — M19071 Primary osteoarthritis, right ankle and foot: Secondary | ICD-10-CM

## 2019-12-18 DIAGNOSIS — Z8639 Personal history of other endocrine, nutritional and metabolic disease: Secondary | ICD-10-CM

## 2019-12-18 DIAGNOSIS — Z8719 Personal history of other diseases of the digestive system: Secondary | ICD-10-CM | POA: Diagnosis not present

## 2019-12-18 DIAGNOSIS — Z8709 Personal history of other diseases of the respiratory system: Secondary | ICD-10-CM

## 2019-12-18 DIAGNOSIS — M0579 Rheumatoid arthritis with rheumatoid factor of multiple sites without organ or systems involvement: Secondary | ICD-10-CM

## 2019-12-18 DIAGNOSIS — M170 Bilateral primary osteoarthritis of knee: Secondary | ICD-10-CM | POA: Diagnosis not present

## 2019-12-18 DIAGNOSIS — M3500 Sicca syndrome, unspecified: Secondary | ICD-10-CM

## 2019-12-18 DIAGNOSIS — C50511 Malignant neoplasm of lower-outer quadrant of right female breast: Secondary | ICD-10-CM | POA: Diagnosis not present

## 2019-12-18 DIAGNOSIS — Z7189 Other specified counseling: Secondary | ICD-10-CM

## 2019-12-18 DIAGNOSIS — L409 Psoriasis, unspecified: Secondary | ICD-10-CM | POA: Diagnosis not present

## 2019-12-18 DIAGNOSIS — M19072 Primary osteoarthritis, left ankle and foot: Secondary | ICD-10-CM

## 2019-12-18 DIAGNOSIS — Z79899 Other long term (current) drug therapy: Secondary | ICD-10-CM

## 2019-12-18 DIAGNOSIS — Z8669 Personal history of other diseases of the nervous system and sense organs: Secondary | ICD-10-CM

## 2019-12-18 DIAGNOSIS — J849 Interstitial pulmonary disease, unspecified: Secondary | ICD-10-CM

## 2019-12-18 DIAGNOSIS — Z170 Estrogen receptor positive status [ER+]: Secondary | ICD-10-CM

## 2019-12-18 NOTE — Patient Instructions (Signed)
Standing Labs We placed an order today for your standing lab work.   Please have your standing labs drawn in December and every 3 months  If possible, please have your labs drawn 2 weeks prior to your appointment so that the provider can discuss your results at your appointment.  We have open lab daily Monday through Thursday from 8:30-12:30 PM and 1:30-4:30 PM and Friday from 8:30-12:30 PM and 1:30-4:00 PM at the office of Dr. Bo Merino, [facility].   Please be advised, patients with office appointments requiring lab work will take precedents over walk-in lab work.  If possible, please come for your lab work on Monday and Friday afternoons, as you may experience shorter wait times. The office is located at [address], [place], [place], [place] [address] No appointment is necessary.   Labs are drawn by [facility]. Please bring your co-pay at the time of your lab draw.  You may receive a bill from [facility] for your lab work.  If you wish to have your labs drawn at another location, please call the office 24 hours in advance to send orders.  If you have any questions regarding directions or hours of operation,  please call [phone].   As a reminder, please drink plenty of water prior to coming for your lab work. Thanks!  COVID-19 vaccine recommendations:   COVID-19 vaccine is recommended for everyone (unless you are allergic to a vaccine component), even if you are on a medication that suppresses your immune system.   Do not take Tylenol or any anti-inflammatory medications (NSAIDs) 24 hours prior to the COVID-19 vaccination.   There is no direct evidence about the efficacy of the COVID-19 vaccine in individuals who are on medications that suppress the immune system.   Even if you are fully vaccinated, and you are on any medications that suppress your immune system, please continue to wear a mask, maintain at least six feet social distance and practice hand hygiene.    If you develop a COVID-19 infection, please contact your PCP or our office to determine if you need monoclonal antibody infusion.  The booster vaccine is now available for immunocompromised patients.   Please see the following web sites for updated information.   [email or web address]

## 2019-12-21 ENCOUNTER — Other Ambulatory Visit: Payer: Self-pay | Admitting: Hematology

## 2019-12-26 ENCOUNTER — Other Ambulatory Visit: Payer: Self-pay | Admitting: Physician Assistant

## 2019-12-26 DIAGNOSIS — M0579 Rheumatoid arthritis with rheumatoid factor of multiple sites without organ or systems involvement: Secondary | ICD-10-CM

## 2019-12-26 NOTE — Telephone Encounter (Signed)
Last Visit: 12/18/2019 Next Visit: 05/13/2020 Labs: 11/03/2019 WBC 11.9, RBC 3.74, Hgb 11.4, Hct 34.4, Total Protein 6.4, Albumin 2.9, Alk phos 37, Glucose 124, calcium 8.5  Current Dose per office note 12/18/2019: Arava 20 mg p.o. daily DX: Rheumatoid arthritis involving multiple sites with positive rheumatoid factor   Okay to refill Arava?

## 2019-12-30 ENCOUNTER — Encounter: Payer: Self-pay | Admitting: Rheumatology

## 2020-01-03 ENCOUNTER — Other Ambulatory Visit: Payer: Self-pay

## 2020-01-03 DIAGNOSIS — Z79899 Other long term (current) drug therapy: Secondary | ICD-10-CM | POA: Diagnosis not present

## 2020-01-04 LAB — COMPLETE METABOLIC PANEL WITH GFR
AG Ratio: 1.8 (calc) (ref 1.0–2.5)
ALT: 13 U/L (ref 6–29)
AST: 20 U/L (ref 10–35)
Albumin: 4.3 g/dL (ref 3.6–5.1)
Alkaline phosphatase (APISO): 33 U/L — ABNORMAL LOW (ref 37–153)
BUN: 14 mg/dL (ref 7–25)
CO2: 28 mmol/L (ref 20–32)
Calcium: 10.1 mg/dL (ref 8.6–10.4)
Chloride: 105 mmol/L (ref 98–110)
Creat: 0.65 mg/dL (ref 0.50–0.99)
GFR, Est African American: 107 mL/min/{1.73_m2} (ref >=60)
GFR, Est Non African American: 93 mL/min/{1.73_m2} (ref >=60)
Globulin: 2.4 g/dL (calc) (ref 1.9–3.7)
Glucose, Bld: 94 mg/dL (ref 65–99)
Potassium: 4.5 mmol/L (ref 3.5–5.3)
Sodium: 140 mmol/L (ref 135–146)
Total Bilirubin: 0.4 mg/dL (ref 0.2–1.2)
Total Protein: 6.7 g/dL (ref 6.1–8.1)

## 2020-01-04 LAB — CBC WITH DIFFERENTIAL/PLATELET
Absolute Monocytes: 635 cells/uL (ref 200–950)
Basophils Absolute: 60 cells/uL (ref 0–200)
Basophils Relative: 1.2 %
Eosinophils Absolute: 100 cells/uL (ref 15–500)
Eosinophils Relative: 2 %
HCT: 43.6 % (ref 35.0–45.0)
Hemoglobin: 14.3 g/dL (ref 11.7–15.5)
Lymphs Abs: 2265 cells/uL (ref 850–3900)
MCH: 30.1 pg (ref 27.0–33.0)
MCHC: 32.8 g/dL (ref 32.0–36.0)
MCV: 91.8 fL (ref 80.0–100.0)
MPV: 10.4 fL (ref 7.5–12.5)
Monocytes Relative: 12.7 %
Neutro Abs: 1940 cells/uL (ref 1500–7800)
Neutrophils Relative %: 38.8 %
Platelets: 299 10*3/uL (ref 140–400)
RBC: 4.75 10*6/uL (ref 3.80–5.10)
RDW: 12.8 % (ref 11.0–15.0)
Total Lymphocyte: 45.3 %
WBC: 5 10*3/uL (ref 3.8–10.8)

## 2020-02-07 ENCOUNTER — Telehealth: Payer: Self-pay | Admitting: Internal Medicine

## 2020-02-07 DIAGNOSIS — I10 Essential (primary) hypertension: Secondary | ICD-10-CM

## 2020-02-07 MED ORDER — OLMESARTAN MEDOXOMIL-HCTZ 20-12.5 MG PO TABS: 1.0000 | ORAL_TABLET | Freq: Every day | ORAL | 3 refills | Status: DC

## 2020-02-07 NOTE — Telephone Encounter (Signed)
Patient called in for refill for olmesartan-hydrochlorothiazide (BENICAR HCT) 20-12.5 MG tablet

## 2020-02-11 DIAGNOSIS — H11041 Peripheral pterygium, stationary, right eye: Secondary | ICD-10-CM | POA: Diagnosis not present

## 2020-02-11 DIAGNOSIS — Z961 Presence of intraocular lens: Secondary | ICD-10-CM | POA: Diagnosis not present

## 2020-02-11 DIAGNOSIS — H04123 Dry eye syndrome of bilateral lacrimal glands: Secondary | ICD-10-CM | POA: Diagnosis not present

## 2020-02-11 DIAGNOSIS — H40011 Open angle with borderline findings, low risk, right eye: Secondary | ICD-10-CM | POA: Diagnosis not present

## 2020-02-12 DIAGNOSIS — M545 Low back pain, unspecified: Secondary | ICD-10-CM

## 2020-02-22 MED ORDER — CYCLOBENZAPRINE HCL 10 MG PO TABS: 10.0000 mg | ORAL_TABLET | Freq: Every day | ORAL | 1 refills | Status: DC

## 2020-02-25 ENCOUNTER — Other Ambulatory Visit: Payer: Self-pay | Admitting: Internal Medicine

## 2020-02-25 DIAGNOSIS — Z8669 Personal history of other diseases of the nervous system and sense organs: Secondary | ICD-10-CM

## 2020-02-28 ENCOUNTER — Other Ambulatory Visit (HOSPITAL_COMMUNITY)
Admission: RE | Admit: 2020-02-28 | Discharge: 2020-02-28 | Disposition: A | Discharge status: Home / Self Care | Payer: Medicare HMO | Source: Ambulatory Visit | Attending: Internal Medicine | Admitting: Internal Medicine

## 2020-02-28 ENCOUNTER — Other Ambulatory Visit: Payer: Self-pay

## 2020-02-28 ENCOUNTER — Ambulatory Visit (INDEPENDENT_AMBULATORY_CARE_PROVIDER_SITE_OTHER): Payer: Medicare HMO | Admitting: Internal Medicine

## 2020-02-28 ENCOUNTER — Encounter: Payer: Self-pay | Admitting: Internal Medicine

## 2020-02-28 VITALS — BP 130/110 | HR 98 | Temp 98.3°F | Ht 68.0 in | Wt 190.1 lb

## 2020-02-28 DIAGNOSIS — N760 Acute vaginitis: Secondary | ICD-10-CM | POA: Diagnosis present

## 2020-02-28 DIAGNOSIS — B373 Candidiasis of vulva and vagina: Secondary | ICD-10-CM | POA: Diagnosis not present

## 2020-02-28 DIAGNOSIS — I152 Hypertension secondary to endocrine disorders: Secondary | ICD-10-CM | POA: Diagnosis not present

## 2020-02-28 DIAGNOSIS — N3000 Acute cystitis without hematuria: Secondary | ICD-10-CM | POA: Diagnosis not present

## 2020-02-28 DIAGNOSIS — J3489 Other specified disorders of nose and nasal sinuses: Secondary | ICD-10-CM

## 2020-02-28 DIAGNOSIS — E039 Hypothyroidism, unspecified: Secondary | ICD-10-CM | POA: Diagnosis not present

## 2020-02-28 DIAGNOSIS — E1159 Type 2 diabetes mellitus with other circulatory complications: Secondary | ICD-10-CM

## 2020-02-28 DIAGNOSIS — B3731 Acute candidiasis of vulva and vagina: Secondary | ICD-10-CM

## 2020-02-28 DIAGNOSIS — L309 Dermatitis, unspecified: Secondary | ICD-10-CM

## 2020-02-28 DIAGNOSIS — B9689 Other specified bacterial agents as the cause of diseases classified elsewhere: Secondary | ICD-10-CM | POA: Insufficient documentation

## 2020-02-28 LAB — LIPID PANEL
Cholesterol: 193 mg/dL (ref 0–200)
HDL: 40.4 mg/dL (ref >39.00)
LDL Cholesterol: 114 mg/dL — ABNORMAL HIGH (ref 0–99)
NonHDL: 152.66
Total CHOL/HDL Ratio: 5
Triglycerides: 194 mg/dL — ABNORMAL HIGH (ref 0.0–149.0)
VLDL: 38.8 mg/dL (ref 0.0–40.0)

## 2020-02-28 LAB — HEMOGLOBIN A1C: Hgb A1c MFr Bld: 6.3 % (ref 4.6–6.5)

## 2020-02-28 LAB — TSH: TSH: 0.88 u[IU]/mL (ref 0.35–4.50)

## 2020-02-28 LAB — PHOSPHORUS: Phosphorus: 3.5 mg/dL (ref 2.3–4.6)

## 2020-02-28 MED ORDER — FLUCONAZOLE 150 MG PO TABS: 150.0000 mg | ORAL_TABLET | Freq: Once | ORAL | 0 refills | Status: DC

## 2020-02-28 MED ORDER — MOMETASONE FUROATE 0.1 % EX OINT: TOPICAL_OINTMENT | Freq: Two times a day (BID) | CUTANEOUS | 0 refills | Status: DC

## 2020-02-28 MED ORDER — MUPIROCIN 2 % EX OINT: 1.0000 "application " | TOPICAL_OINTMENT | Freq: Two times a day (BID) | CUTANEOUS | 0 refills | Status: DC

## 2020-02-28 MED ORDER — TACROLIMUS 0.1 % EX OINT: TOPICAL_OINTMENT | Freq: Two times a day (BID) | CUTANEOUS | 0 refills | Status: DC

## 2020-02-28 NOTE — Patient Instructions (Addendum)
vanicream for your lips protopic  Elocon  bactroban to nose   Please review foods high in phosphorus    Hypophosphatemia Hypophosphatemia is when the level of phosphate in a person's blood is low. Phosphate is an important mineral (electrolyte) for the strength and structure of bones and teeth. Phosphate is also important for muscle functioning. Low blood phosphate levels can cause a variety of symptoms and problems. What are the causes? Rapid (acute) onset of this condition may be caused by:  Alcoholism.  Severe burns.  Being fed nutrition through an IV (total parenteral nutrition).  Refeeding after a long period of starvation or poor nutrition.  A complication of diabetes that involves high blood glucose levels (diabetic ketoacidosis). Slower (chronic) onset of this condition may be caused by:  Chronic diarrhea.  Vitamin D deficiency.  Excessive use of antacids containing aluminum.  Excessive use of diuretics.  Overactive parathyroid glands (hyperparathyroidism).  Use of steroid medicines.  Underactive thyroid glands (hypothyroidism).  Low levels of other electrolytes, such as magnesium (hypomagnesemia) or potassium (hypokalemia).  Genetic kidney problems, such as autosomal dominant hypophosphatemic rickets.  A condition caused by certain types of tumors (oncogenic osteomalacia). What are the signs or symptoms? Symptoms of this condition include:  Bone pain.  Bowed legs.  Growth problems, such as short height.  Weak muscles.  Confusion.  Shortness of breath.  Seizures. How is this diagnosed? This condition is usually diagnosed through blood tests.   How is this treated? Treatment for this condition may include:  Phosphate given by mouth (orally) or given through an IV inserted into one of your veins. The method used for giving phosphate will depend on the severity of the condition.  Monitoring of your phosphate and other electrolyte levels. You may  need to be monitored in the hospital during treatment. Other treatments will depend on the underlying cause of the condition. Follow these instructions at home:  Follow diet instructions from your health care provider or dietitian.  Keep all follow-up visits as told by your health care provider. This is important. Contact a health care provider if you:  Develop increased muscle weakness. Get help right away if you:  Have chest pain.  Have difficulty breathing.  Think you may have a bone fracture.  Have severe pain in your joints or bones. Summary  Hypophosphatemia is when the level of phosphate in a person's blood is low.  Low blood phosphate levels can cause a variety of symptoms and problems.  Treatment includes phosphate given by mouth or IV, along with monitoring your phosphate and other electrolyte levels. You may need to be monitored in the hospital during treatment.  Contact a health care provider if you develop increased muscle weakness. This information is not intended to replace advice given to you by your health care provider. Make sure you discuss any questions you have with your health care provider. Document Revised: 04/27/2018 Document Reviewed: 09/27/2017 Elsevier Patient Education  2021 [place].  Vaginal Yeast Infection, Adult  Vaginal yeast infection is a condition that causes vaginal discharge as well as soreness, swelling, and redness (inflammation) of the vagina. This is a common condition. Some women get this infection frequently. What are the causes? This condition is caused by a change in the normal balance of the yeast (candida) and bacteria that live in the vagina. This change causes an overgrowth of yeast, which causes the inflammation. What increases the risk? The condition is more likely to develop in women who:  Take antibiotic  medicines.  Have diabetes.  Take birth control pills.  Are pregnant.  Douche often.  Have a weak body  defense system (immune system).  Have been taking steroid medicines for a long time.  Frequently wear tight clothing. What are the signs or symptoms? Symptoms of this condition include:  White, thick, creamy vaginal discharge.  Swelling, itching, redness, and irritation of the vagina. The lips of the vagina (vulva) may be affected as well.  Pain or a burning feeling while urinating.  Pain during sex. How is this diagnosed? This condition is diagnosed based on:  Your medical history.  A physical exam.  A pelvic exam. Your health care provider will examine a sample of your vaginal discharge under a microscope. Your health care provider may send this sample for testing to confirm the diagnosis. How is this treated? This condition is treated with medicine. Medicines may be over-the-counter or prescription. You may be told to use one or more of the following:  Medicine that is taken by mouth (orally).  Medicine that is applied as a cream (topically).  Medicine that is inserted directly into the vagina (suppository). Follow these instructions at home: Lifestyle  Do not have sex until your health care provider approves. Tell your sex partner that you have a yeast infection. That person should go to his or her health care provider and ask if they should also be treated.  Do not wear tight clothes, such as pantyhose or tight pants.  Wear breathable cotton underwear. General instructions  Take or apply over-the-counter and prescription medicines only as told by your health care provider.  Eat more yogurt. This may help to keep your yeast infection from returning.  Do not use tampons until your health care provider approves.  Try taking a sitz bath to help with discomfort. This is a warm water bath that is taken while you are sitting down. The water should only come up to your hips and should cover your buttocks. Do this 3-4 times per day or as told by your health care  provider.  Do not douche.  If you have diabetes, keep your blood sugar levels under control.  Keep all follow-up visits as told by your health care provider. This is important.   Contact a health care provider if:  You have a fever.  Your symptoms go away and then return.  Your symptoms do not get better with treatment.  Your symptoms get worse.  You have new symptoms.  You develop blisters in or around your vagina.  You have blood coming from your vagina and it is not your menstrual period.  You develop pain in your abdomen. Summary  Vaginal yeast infection is a condition that causes discharge as well as soreness, swelling, and redness (inflammation) of the vagina.  This condition is treated with medicine. Medicines may be over-the-counter or prescription.  Take or apply over-the-counter and prescription medicines only as told by your health care provider.  Do not douche. Do not have sex or use tampons until your health care provider approves.  Contact a health care provider if your symptoms do not get better with treatment or your symptoms go away and then return. This information is not intended to replace advice given to you by your health care provider. Make sure you discuss any questions you have with your health care provider. Document Revised: 08/04/2018 Document Reviewed: 05/23/2017 Elsevier Patient Education  [place].

## 2020-02-28 NOTE — Progress Notes (Signed)
Chief Complaint  Patient presents with  . Vaginal Discharge    Ongoing 1 month, white appearance  . Skin Problem    Pt complained lips feel raw and burned, appear to be red/swollen; has tried different medicated chapsticks without any resolution.    F/u  1. Lip dermatitis lips fraw and painful tried chapstick vasoline, blistex, abreva, and no help ongoing x 2 weeks  2. Hypothyroidism on levo 100 mcg qd  3. DM 2  4. Vaginal discharge new and irritation and pain  5. S/p GB removal 10/2019 lost 30 lbs doing better no diarrhea, no ab pain and breathing better so no longer doing spiriva and symbicort  6. C/o dry mouth so tried pilocarpine 5 mg tid  7. HTN BP elevated today will recheck on benicar hctz 20.12.5 mg qd    Review of Systems  Constitutional: Negative for weight loss.  HENT: Negative for hearing loss.   Eyes: Negative for blurred vision.  Respiratory: Negative for shortness of breath.   Cardiovascular: Negative for chest pain.  Genitourinary:       +vaginal discharge   Musculoskeletal: Negative for falls and joint pain.  Skin: Positive for rash.  Neurological: Negative for headaches.  Psychiatric/Behavioral: Negative for depression.   Past Medical History:  Diagnosis Date  . Cancer ([place]) 02/2019   right breast IDC  . Cataracts, bilateral    tbd surgery 2021  . Complication of anesthesia   . Cough   . Difficult intubation    TRACH AT 19  . Dyspnea    W/ COUGHING  . Family history of breast cancer   . Family history of lung cancer   . Family history of prostate cancer   . Family history of skin cancer   . GERD (gastroesophageal reflux disease)   . Headache   . High cholesterol   . History of hiatal hernia   . Hypertension   . Hypothyroidism   . Irritable bowel   . Osteoarthritis    i.e right knee   . PONV (postoperative nausea and vomiting)   . Pre-diabetes   . Rheumatoid arthritis ([place])   . Thyroid disease    Past Surgical History:  Procedure  Laterality Date  . ABDOMINAL HYSTERECTOMY     age 70 ovaries out  . BREAST LUMPECTOMY WITH RADIOACTIVE SEED AND SENTINEL LYMPH NODE BIOPSY Right 03/28/2019   Procedure: RIGHT BREAST LUMPECTOMY WITH RADIOACTIVE SEED AND SENTINEL LYMPH NODE BIOPSY;  Surgeon: Donnie Mesa, MD;  [facility]  Service: General;  Laterality: Right;  PEC BLOCK FOR POST OP PAIN  . BREAST SURGERY    . CATARACT EXTRACTION, BILATERAL    . CHOLECYSTECTOMY     10/2019  . ESOPHAGEAL DILATION     Dr. Olevia Perches 15 years from 2021  . TONSILLECTOMY    . TUBAL LIGATION     Family History  Problem Relation Age of Onset  . Hypertension Mother   . Thyroid disease Mother   . Breast cancer Mother 8       breast dx age 30 died age 15   . Prostate cancer Father 76       metastatic  . Stroke Maternal Grandmother   . Skin cancer Maternal Uncle        dx. in his 87s  . Lung cancer Maternal Uncle        smoker, died age 31   Social History   Socioeconomic History  . Marital status: Married  Spouse name: Not on file  . Number of children: 2  . Years of education: Not on file  . Highest education level: Not on file  Occupational History  . Occupation: nurse  Tobacco Use  . Smoking status: Former Smoker    Packs/day: 0.25    Years: 7.00    Pack years: 1.75    Types: Cigarettes    Quit date: 2000    Years since quitting: 22.1  . Smokeless tobacco: Never Used  Vaping Use  . Vaping Use: Never used  Substance and Sexual Activity  . Alcohol use: Yes  . Drug use: Never  . Sexual activity: Yes  Other Topics Concern  . Not on file  Social History Narrative   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   Has a beach house    Social Determinants of Health   Financial Resource Strain: Low Risk   . Difficulty of Paying Living Expenses: Not hard at all  Food Insecurity: No Food Insecurity  . Worried About Charity fundraiser in the Last Year: Never true  . Ran Out of Food in the Last Year: Never true   Transportation Needs: No Transportation Needs  . Lack of Transportation (Medical): No  . Lack of Transportation (Non-Medical): No  Physical Activity: Not on file  Stress: No Stress Concern Present  . Feeling of Stress : Not at all  Social Connections: Unknown  . Frequency of Communication with Friends and Family: Not on file  . Frequency of Social Gatherings with Friends and Family: Not on file  . Attends Religious Services: Not on file  . Active Member of Clubs or Organizations: Not on file  . Attends Archivist Meetings: Not on file  . Marital Status: Married  Human resources officer Violence: Not At Risk  . Fear of Current or Ex-Partner: No  . Emotionally Abused: No  . Physically Abused: No  . Sexually Abused: No   Current Meds  Medication Sig  . azelastine (ASTELIN) 0.1 % nasal spray Place 1 spray into both nostrils 2 (two) times daily.  . Calcium Carbonate Antacid (TUMS CHEWY BITES PO) Take 1 tablet by mouth at bedtime as needed (acid reflux).  . cyclobenzaprine (FLEXERIL) 10 MG tablet Take 1 tablet (10 mg total) by mouth daily.  . famotidine-calcium carbonate-magnesium hydroxide (PEPCID COMPLETE) 10-800-165 MG chewable tablet Chew 1 tablet by mouth daily as needed (acid reflux).  . fluconazole (DIFLUCAN) 150 MG tablet Take 1 tablet (150 mg total) by mouth once for 1 dose.  Marland Kitchen glucosamine-chondroitin 500-400 MG tablet Take 2 tablets by mouth daily.   Marland Kitchen leflunomide (ARAVA) 20 MG tablet TAKE 1 TABLET(20 MG) BY MOUTH DAILY  . levothyroxine (SYNTHROID) 100 MCG tablet Daily 30 minutes before food (Patient taking differently: Take 100 mcg by mouth daily before breakfast.)  . loratadine (CLARITIN) 10 MG tablet Take 10 mg by mouth every other day.   . loratadine-pseudoephedrine (CLARITIN-D 24-HOUR) 10-240 MG 24 hr tablet Take 1 tablet by mouth every other day.  . metFORMIN (GLUCOPHAGE XR) 500 MG 24 hr tablet Take 1 tablet (500 mg total) by mouth daily with breakfast.  . mometasone  (ELOCON) 0.1 % ointment Apply topically in the morning and at bedtime. X 7-10 days  . Multiple Vitamin (MULTIVITAMIN WITH MINERALS) TABS tablet Take 1 tablet by mouth daily.  . mupirocin ointment (BACTROBAN) 2 % Apply 1 application topically 2 (two) times daily. Nose  . olmesartan-hydrochlorothiazide (BENICAR HCT) 20-12.5 MG tablet Take 1 tablet by mouth daily.  In am  . omeprazole (PRILOSEC) 40 MG capsule Take 40 mg by mouth daily.  . pravastatin (PRAVACHOL) 20 MG tablet Take 20 mg by mouth at bedtime.   . promethazine (PHENERGAN) 25 MG tablet Take 1 tablet (25 mg total) by mouth 2 (two) times daily as needed. (Patient taking differently: Take 25 mg by mouth 2 (two) times daily as needed for nausea or vomiting.)  . RESTASIS 0.05 % ophthalmic emulsion Place 1 drop into both eyes 2 (two) times daily.   . SUMAtriptan (IMITREX) 50 MG tablet Take 1 tablet (50 mg total) by mouth every 2 (two) hours as needed for migraine.  . tacrolimus (PROTOPIC) 0.1 % ointment Apply topically 2 (two) times daily. Bid until better  . tamoxifen (NOLVADEX) 20 MG tablet TAKE 1 TABLET(20 MG) BY MOUTH DAILY  . topiramate (TOPAMAX) 100 MG tablet TAKE 1 TABLET(100 MG) BY MOUTH DAILY   Allergies  Allergen Reactions  . Pentazocine Lactate Other (See Comments)    Hallucinations  . Demerol [Meperidine] Nausea And Vomiting  . Lisinopril Cough   Recent Results (from the past 2160 hour(s))  COMPLETE METABOLIC PANEL WITH GFR     Status: Abnormal   Collection Time: 01/03/20 11:55 AM  Result Value Ref Range   Glucose, Bld 94 65 - 99 mg/dL    Comment: .            Fasting reference interval .    BUN 14 7 - 25 mg/dL   Creat 0.65 0.50 - 0.99 mg/dL    Comment: For patients >81 years of age, the reference limit for Creatinine is approximately 13% higher for people identified as African-American. .    GFR, Est Non African American 93 > OR = 60 mL/min/1.74m2   GFR, Est African American 107 > OR = 60 mL/min/1.44m2    BUN/Creatinine Ratio NOT APPLICABLE 6 - 22 (calc)   Sodium 140 135 - 146 mmol/L   Potassium 4.5 3.5 - 5.3 mmol/L   Chloride 105 98 - 110 mmol/L   CO2 28 20 - 32 mmol/L   Calcium 10.1 8.6 - 10.4 mg/dL   Total Protein 6.7 6.1 - 8.1 g/dL   Albumin 4.3 3.6 - 5.1 g/dL   Globulin 2.4 1.9 - 3.7 g/dL (calc)   AG Ratio 1.8 1.0 - 2.5 (calc)   Total Bilirubin 0.4 0.2 - 1.2 mg/dL   Alkaline phosphatase (APISO) 33 (L) 37 - 153 U/L   AST 20 10 - 35 U/L   ALT 13 6 - 29 U/L  CBC with Differential/Platelet     Status: None   Collection Time: 01/03/20 11:55 AM  Result Value Ref Range   WBC 5.0 3.8 - 10.8 Thousand/uL   RBC 4.75 3.80 - 5.10 Million/uL   Hemoglobin 14.3 11.7 - 15.5 g/dL   HCT 43.6 35.0 - 45.0 %   MCV 91.8 80.0 - 100.0 fL   MCH 30.1 27.0 - 33.0 pg   MCHC 32.8 32.0 - 36.0 g/dL   RDW 12.8 11.0 - 15.0 %   Platelets 299 140 - 400 Thousand/uL   MPV 10.4 7.5 - 12.5 fL   Neutro Abs 1,940 1,500 - 7,800 cells/uL   Lymphs Abs 2,265 850 - 3,900 cells/uL   Absolute Monocytes 635 200 - 950 cells/uL   Eosinophils Absolute 100 15 - 500 cells/uL   Basophils Absolute 60 0 - 200 cells/uL   Neutrophils Relative % 38.8 %   Total Lymphocyte 45.3 %   Monocytes Relative 12.7 %  Eosinophils Relative 2.0 %   Basophils Relative 1.2 %   Objective  Body mass index is 28.91 kg/m. Wt Readings from Last 3 Encounters:  02/28/20 190 lb 2 oz (86.2 kg)  12/18/19 195 lb (88.5 kg)  11/02/19 200 lb (90.7 kg)   Temp Readings from Last 3 Encounters:  02/28/20 98.3 F (36.8 C)  11/03/19 98.2 F (36.8 C)  11/01/19 99.4 F (37.4 C) (Oral)   BP Readings from Last 3 Encounters:  02/28/20 (!) 130/110  12/18/19 117/79  11/03/19 112/60   Pulse Readings from Last 3 Encounters:  02/28/20 98  12/18/19 99  11/03/19 93    Physical Exam Vitals and nursing note reviewed. Exam conducted with a chaperone present.  Constitutional:      Appearance: Normal appearance. She is well-developed, well-groomed and  overweight.  HENT:     Head: Normocephalic and atraumatic.  Eyes:     Conjunctiva/sclera: Conjunctivae normal.     Pupils: Pupils are equal, round, and reactive to light.  Cardiovascular:     Rate and Rhythm: Normal rate and regular rhythm.     Heart sounds: Normal heart sounds. No murmur heard.   Pulmonary:     Effort: Pulmonary effort is normal.     Breath sounds: Normal breath sounds.  Abdominal:     Tenderness: There is no abdominal tenderness.  Genitourinary:    Pubic Area: Rash present.     Labia:        Right: Rash present.        Left: Rash present.      Vagina: Normal.     Comments: Vaginal labia inner redness b/l ? Yeast or dryness  Absent cervix yellow white discharge   Skin:    General: Skin is warm and dry.  Neurological:     General: No focal deficit present.     Mental Status: She is alert and oriented to person, place, and time. Mental status is at baseline.     Gait: Gait normal.  Psychiatric:        Attention and Perception: Attention and perception normal.        Mood and Affect: Mood and affect normal.        Speech: Speech normal.        Behavior: Behavior normal. Behavior is cooperative.        Thought Content: Thought content normal.        Cognition and Memory: Cognition and memory normal.        Judgment: Judgment normal.     Assessment  Plan  Dermatitis of lip - Plan: mometasone (ELOCON) 0.1 % ointment bid x 7-10 days, tacrolimus (PROTOPIC) 0.1 % ointment rec vanicream otc   Hypophosphatemia - Plan: Phosphorus  Hypertension associated with diabetes ([place]) - Plan: Lipid panel, Hemoglobin A1c No BP meds today elevated   Hypothyroidism, unspecified type - Plan: TSH On levo 100 mcg qd   Acute vaginitis - Plan: Cervicovaginal ancillary only( [place]), fluconazole (DIFLUCAN) 150 MG tablet Pelvic exam today   Acute cystitis without hematuria - Plan: Urinalysis, Routine w reflex microscopic, Urine Culture  Yeast vaginitis - Plan:  fluconazole (DIFLUCAN) 150 MG tablet  Sore in nose - Plan: mupirocin ointment (BACTROBAN) 2 %   HM Flu shotdeclinesconsider in future  Tdap utd due in 2026  prevnarutd 10/03/19 pna 23disc today could consider in the future shingrixins wouldnt cover covid 19 vaccine2/2 pfizerhad booster had 3/3   Labsfasting 04/18/19 will [facility] rheumatology Dr. Estanislado Pandy Skindermatology Dr.  [place]ax est saw summer 2020 f/u yearly h/o [facility] chestappt scheduled as of 08/28/19 [facility] 2/4/21abnormal and repeat 03/09/19 right breast mass right breast rec biopsy did she get this Dr. Burr Medico onc appt 03/06/20  Paps/p total hysterectomy due to uterine prolapse h/o abnormal pap remotely ovaries removed  Colonoscopywill refer Dr. Alice Reichert had and EGD 08/14/19 with bxs  dexaget [facility] osteopenia 02/22/19 with mammo  Former smoker quit 1 year ago in 2019/2020 but ocass. Light smoking 2x per year socially Eyes doing after cataract surgery 02/13/19 Dr. Herbert Deaner Surgery note 03/21/19 Dr. Georgette Dover   INVASIVE DUCTAL CARCINOMA OF RIGHT BREAST, STAGE 1 (C50.911) Impression: RLOQ 5 mm 5 cmfn IDC, ER/PR +, Her2 - 04/24/19 [facility] f/u Dr. Georgette Dover 03/28/19 right radioactive seed lumpectomy and sentinel ln bx invasive ductal ca 0.9 cm with some surrounding DCIS margins negative 2 nodes negative begin radiation next week with HT after radiation  F/u in 3 months On Tamoxifen as of 08/16/19 appt Dr. Darnell Level lung 09/18/19 to consider CT chest   Provider: Dr. Olivia Mackie McLean-Scocuzza-Internal Medicine

## 2020-02-29 ENCOUNTER — Other Ambulatory Visit: Payer: Self-pay | Admitting: Internal Medicine

## 2020-02-29 DIAGNOSIS — B9689 Other specified bacterial agents as the cause of diseases classified elsewhere: Secondary | ICD-10-CM

## 2020-02-29 DIAGNOSIS — B3731 Acute candidiasis of vulva and vagina: Secondary | ICD-10-CM

## 2020-02-29 DIAGNOSIS — N760 Acute vaginitis: Secondary | ICD-10-CM

## 2020-02-29 DIAGNOSIS — B373 Candidiasis of vulva and vagina: Secondary | ICD-10-CM

## 2020-02-29 LAB — URINALYSIS, ROUTINE W REFLEX MICROSCOPIC
Bilirubin, UA: NEGATIVE
Glucose, UA: NEGATIVE
Ketones, UA: NEGATIVE
Nitrite, UA: NEGATIVE
Protein,UA: NEGATIVE
RBC, UA: NEGATIVE
Specific Gravity, UA: 1.02 (ref 1.005–1.030)
Urobilinogen, Ur: 0.2 mg/dL (ref 0.2–1.0)
pH, UA: 5 (ref 5.0–7.5)

## 2020-02-29 LAB — MICROSCOPIC EXAMINATION: Casts: NONE SEEN /lpf

## 2020-02-29 LAB — CERVICOVAGINAL ANCILLARY ONLY
Bacterial Vaginitis (gardnerella): POSITIVE — AB
Candida Glabrata: NEGATIVE
Candida Vaginitis: POSITIVE — AB
Comment: NEGATIVE
Comment: NEGATIVE
Comment: NEGATIVE

## 2020-02-29 MED ORDER — FLUCONAZOLE 150 MG PO TABS
150.0000 mg | ORAL_TABLET | Freq: Once | ORAL | 0 refills | Status: AC
Start: 2020-02-29 — End: 2020-02-29

## 2020-02-29 MED ORDER — METRONIDAZOLE 500 MG PO TABS: 500.0000 mg | ORAL_TABLET | Freq: Two times a day (BID) | ORAL | 0 refills | Status: DC

## 2020-03-01 LAB — URINE CULTURE

## 2020-03-03 ENCOUNTER — Encounter: Payer: Self-pay | Admitting: *Deleted

## 2020-03-03 NOTE — Progress Notes (Signed)
[place]   [phone] [phone]   Clinic Follow up Note   Patient Care Team: McLean-Scocuzza, Nino Glow, MD as PCP - General (Internal Medicine) Kyung Rudd, MD as Consulting Physician (Radiation Oncology) Truitt Merle, MD as Consulting Physician (Hematology) Donnie Mesa, MD as Consulting Physician (General Surgery) Mauro Kaufmann, RN as Oncology Nurse Navigator Rockwell Germany, RN as Oncology Nurse Navigator Alla Feeling, NP as Nurse Practitioner (Nurse Practitioner)  Date of Service:  03/06/2020  CHIEF COMPLAINT: F/u of right breast cancer   SUMMARY OF ONCOLOGIC HISTORY: Oncology History Overview Note  Cancer Staging Malignant neoplasm of lower-outer quadrant of right breast of female, estrogen receptor positive ([place]) Staging form: Breast, AJCC 8th Edition - Clinical stage from 03/14/2019: Stage IA (cT1a, cN0, cM0, G1, ER+, PR+, HER2-) - Signed by Truitt Merle, MD on 03/20/2019 - Pathologic stage from 03/28/2019: Stage IA (pT1b, pN0(sn), cM0, G1, ER+, PR+, HER2-) - Unsigned    Malignant neoplasm of lower-outer quadrant of right breast of female, estrogen receptor positive ([place])  02/22/2019 Imaging   Bone Density Scan  02/22/19 DEXA shows osteopenia with lowest T-score -1.4 at left hip   03/09/2019 Mammogram   Diagnostic Mammogram 03/09/19 IMPRESSION the 85m mass in 7:00 posiiton of right breast suspicous of malignancy.     03/14/2019 Cancer Staging   Staging form: Breast, AJCC 8th Edition - Clinical stage from 03/14/2019: Stage IA (cT1a, cN0, cM0, G1, ER+, PR+, HER2-) - Signed by [name] MD on 03/20/2019   03/14/2019 Initial Biopsy   Diagnosis 03/14/19  Breast, right, needle core biopsy, 7 o'clock, 5cmfn - INVASIVE DUCTAL CARCINOMA. SEE NOTE Diagnosis Note Carcinoma measures 0.4 cm in greatest linear dimension and appears grade 1. Dr. CJeannie Donereviewed the case and concurs with the diagnosis. A breast prognostic profile (ER, PR, Ki-67 and  HER2) is pending and will be reported in an addendum. Dr. BIsaiah Blakeswas notified on 11/12/2019.   03/14/2019 Receptors her2   PROGNOSTIC INDICATORS Results: IMMUNOHISTOCHEMICAL AND MORPHOMETRIC ANALYSIS PERFORMED MANUALLY The tumor cells are EQUIVOCAL for Her2 (2+). Her2 by FISH will be performed and results reported separately. Estrogen Receptor: 95%, POSITIVE, STRONG STAINING INTENSITY Progesterone Receptor: 90%, POSITIVE, MODERATE STAINING INTENSITY Proliferation Marker Ki67: 10%   03/19/2019 Initial Diagnosis   Malignant neoplasm of lower-outer quadrant of right breast of female, estrogen receptor positive ([place]   03/28/2019 Surgery   RIGHT BREAST LUMPECTOMY WITH RADIOACTIVE SEED AND SENTINEL LYMPH NODE BIOPSY by Dr TGeorgette Dover   03/28/2019 Pathology Results   FINAL MICROSCOPIC DIAGNOSIS:   A. BREAST, RIGHT, LUMPECTOMY:  - Invasive ductal carcinoma, 0.9 cm, Nottingham grade 1 of 3.  - Ductal carcinoma in situ, intermediate nuclear grade.  - Margins of resection are not involved.       - Invasive carcinoma, closest margin: 3 mm, superior.       - In situ carcinoma, closest margin: 3 mm, medial.  - Biopsy site.  - See oncology table.   B. SENTINEL LYMPH NODE, RIGHT AXILLARY #1, BIOPSY:  - One lymph node, negative for carcinoma (0/1).   C. SENTINEL LYMPH NODE, RIGHT AXILLARY #2, BIOPSY:  - One lymph node, negative for carcinoma (0/1).    03/28/2019 Cancer Staging   Staging form: Breast, AJCC 8th Edition - Pathologic stage from 03/28/2019: Stage IA (pT1b, pN0(sn), cM0, G1, ER+, PR+, HER2-) - Signed by [name] MD on 06/03/2019   04/26/2019 Genetic Testing   Negative genetic testing:  No pathogenic variants detected on  the [facility] Common Hereditary Cancers panel. The report date is 04/26/2019.  The Common Hereditary Cancers Panel offered by [facility] includes sequencing and/or deletion duplication testing of the following 48 genes: APC, ATM, AXIN2, BARD1, BMPR1A, BRCA1, BRCA2, BRIP1, CDH1,  CDK4, CDKN2A (p14ARF), CDKN2A (p16INK4a), CHEK2, CTNNA1, DICER1, EPCAM (Deletion/duplication testing only), GREM1 (promoter region deletion/duplication testing only), KIT, MEN1, MLH1, MSH2, MSH3, MSH6, MUTYH, NBN, NF1, NHTL1, PALB2, PDGFRA, PMS2, POLD1, POLE, PTEN, RAD50, RAD51C, RAD51D, RNF43, SDHB, SDHC, SDHD, SMAD4, SMARCA4. STK11, TP53, TSC1, TSC2, and VHL.  The following genes were evaluated for sequence changes only: SDHA and HOXB13 c.251G>A variant only.    05/08/2019 - 06/04/2019 Radiation Therapy   Adjuvant Radiation with Dr Moody    06/2019 -  Anti-estrogen oral therapy   Tamoxifen starting in 06/2019    09/04/2019 Survivorship   SCP delivered by Lacie Burton, NP       CURRENT THERAPY:  Tamoxifen starting in 06/2019   INTERVAL HISTORY:  Cheryl Knight is here for a follow up of right breast cancer. She as last seen by me 9 months ago and by NP Lacie 6 moths ago in interim. She presents to the clinic alone. Had gall bladder infection in 10/2019,had surgery, cough and SOB resolved after surgery  She is doing well overall, tolerating tamoxifen very well  Vaginal yeast infection lately being treated by GYN  No join pain or other pain  She has lost some weight around her gall bladder surgery   All other systems were reviewed with the patient and are negative.  MEDICAL HISTORY:  Past Medical History:  Diagnosis Date  . Cancer (HCC) 02/2019   right breast IDC  . Cataracts, bilateral    tbd surgery 2021  . Complication of anesthesia   . Cough   . Difficult intubation    TRACH AT 19  . Dyspnea    W/ COUGHING  . Family history of breast cancer   . Family history of lung cancer   . Family history of prostate cancer   . Family history of skin cancer   . GERD (gastroesophageal reflux disease)   . Headache   . High cholesterol   . History of hiatal hernia   . Hypertension   . Hypothyroidism   . Irritable bowel   . Osteoarthritis    i.e right knee   . PONV (postoperative  nausea and vomiting)   . Pre-diabetes   . Rheumatoid arthritis (HCC)   . Thyroid disease     SURGICAL HISTORY: Past Surgical History:  Procedure Laterality Date  . ABDOMINAL HYSTERECTOMY     age 39 ovaries out  . BREAST LUMPECTOMY WITH RADIOACTIVE SEED AND SENTINEL LYMPH NODE BIOPSY Right 03/28/2019   Procedure: RIGHT BREAST LUMPECTOMY WITH RADIOACTIVE SEED AND SENTINEL LYMPH NODE BIOPSY;  Surgeon: Tsuei, Matthew, MD;  Location: [facility];  Service: General;  Laterality: Right;  PEC BLOCK FOR POST OP PAIN  . BREAST SURGERY    . CATARACT EXTRACTION, BILATERAL    . CHOLECYSTECTOMY     10/2019  . ESOPHAGEAL DILATION     Dr. Brodie 15 years from 2021  . TONSILLECTOMY    . TUBAL LIGATION      I have reviewed the social history and family history with the patient and they are unchanged from previous note.  ALLERGIES:  is allergic to pentazocine lactate, demerol [meperidine], and lisinopril.  MEDICATIONS:  Current Outpatient Medications  Medication Sig Dispense Refill  . azelastine (ASTELIN) 0.1 %   nasal spray Place 1 spray into both nostrils 2 (two) times daily. 30 mL 12  . budesonide-formoterol (SYMBICORT) 160-4.5 MCG/ACT inhaler Inhale 1 puff into the lungs daily. (Patient not taking: No sig reported)    . Calcium Carbonate Antacid (TUMS CHEWY BITES PO) Take 1 tablet by mouth at bedtime as needed (acid reflux).    . cyclobenzaprine (FLEXERIL) 10 MG tablet Take 1 tablet (10 mg total) by mouth daily. 90 tablet 1  . famotidine-calcium carbonate-magnesium hydroxide (PEPCID COMPLETE) 10-800-165 MG chewable tablet Chew 1 tablet by mouth daily as needed (acid reflux).    Marland Kitchen glucosamine-chondroitin 500-400 MG tablet Take 2 tablets by mouth daily.     . Guaifenesin 1200 MG TB12 Take 1,200 mg by mouth in the morning and at bedtime.  (Patient not taking: No sig reported)    . leflunomide (ARAVA) 20 MG tablet TAKE 1 TABLET(20 MG) BY MOUTH DAILY 30 tablet 2  . levothyroxine  (SYNTHROID) 100 MCG tablet Daily 30 minutes before food (Patient taking differently: Take 100 mcg by mouth daily before breakfast.) 90 tablet 3  . loratadine (CLARITIN) 10 MG tablet Take 10 mg by mouth every other day.     . loratadine-pseudoephedrine (CLARITIN-D 24-HOUR) 10-240 MG 24 hr tablet Take 1 tablet by mouth every other day.    . metFORMIN (GLUCOPHAGE XR) 500 MG 24 hr tablet Take 1 tablet (500 mg total) by mouth daily with breakfast. 90 tablet 3  . metroNIDAZOLE (FLAGYL) 500 MG tablet Take 1 tablet (500 mg total) by mouth 2 (two) times daily. With food 14 tablet 0  . mometasone (ELOCON) 0.1 % ointment Apply topically in the morning and at bedtime. X 7-10 days 15 g 0  . Multiple Vitamin (MULTIVITAMIN WITH MINERALS) TABS tablet Take 1 tablet by mouth daily.    . mupirocin ointment (BACTROBAN) 2 % Apply 1 application topically 2 (two) times daily. Nose 30 g 0  . olmesartan-hydrochlorothiazide (BENICAR HCT) 20-12.5 MG tablet Take 1 tablet by mouth daily. In am 90 tablet 3  . omeprazole (PRILOSEC) 40 MG capsule Take 40 mg by mouth daily.    . pravastatin (PRAVACHOL) 40 MG tablet Take 1 tablet (40 mg total) by mouth at bedtime. 90 tablet 1  . promethazine (PHENERGAN) 25 MG tablet Take 1 tablet (25 mg total) by mouth 2 (two) times daily as needed. (Patient taking differently: Take 25 mg by mouth 2 (two) times daily as needed for nausea or vomiting.) 30 tablet 5  . RESTASIS 0.05 % ophthalmic emulsion Place 1 drop into both eyes 2 (two) times daily.     . SUMAtriptan (IMITREX) 50 MG tablet Take 1 tablet (50 mg total) by mouth every 2 (two) hours as needed for migraine.    . tacrolimus (PROTOPIC) 0.1 % ointment Apply topically 2 (two) times daily. Bid until better 30 g 0  . tamoxifen (NOLVADEX) 20 MG tablet TAKE 1 TABLET(20 MG) BY MOUTH DAILY 30 tablet 5  . Tiotropium Bromide Monohydrate (SPIRIVA RESPIMAT) 1.25 MCG/ACT AERS Inhale 2 puffs into the lungs daily. (Patient not taking: No sig reported) 4 g  0  . topiramate (TOPAMAX) 100 MG tablet TAKE 1 TABLET(100 MG) BY MOUTH DAILY 90 tablet 3   No current facility-administered medications for this visit.   Facility-Administered Medications Ordered in Other Visits  Medication Dose Route Frequency Provider Last Rate Last Admin  . acetaminophen (OFIRMEV) IV   Intravenous Anesthesia Intra-op Fletcher-Harrison, Tawana, CRNA   1,000 mg at 11/02/19 1226  . dexamethasone (  DECADRON) injection   Intravenous Anesthesia Intra-op Fletcher-Harrison, Tawana, CRNA   10 mg at 11/02/19 1147  . dexmedetomidine (PRECEDEX) in NS 20 mcg/5mL (4 mcg/mL) IV Syringe   Intravenous Anesthesia Intra-op Fletcher-Harrison, Tawana, CRNA   4 mcg at 11/02/19 1352  . fentaNYL (SUBLIMAZE) injection   Intravenous Anesthesia Intra-op Fletcher-Harrison, Tawana, CRNA   50 mcg at 11/02/19 1305  . glycopyrrolate (ROBINUL) injection   Intravenous Anesthesia Intra-op Fletcher-Harrison, Tawana, CRNA   0.2 mg at 11/02/19 1147  . lidocaine (cardiac) 100 mg/5mL (XYLOCAINE) injection 2%   Intravenous Anesthesia Intra-op Fletcher-Harrison, Tawana, CRNA   100 mg at 11/02/19 1130  . midazolam (VERSED) injection   Intravenous Anesthesia Intra-op Fletcher-Harrison, Tawana, CRNA   2 mg at 11/02/19 1120  . ondansetron (ZOFRAN) injection   Intravenous Anesthesia Intra-op Fletcher-Harrison, Tawana, CRNA   4 mg at 11/02/19 1412  . phenylephrine (NEO-SYNEPHRINE) 100 mcg/mL in sodium chloride 0.9 % 100 mL infusion   Intravenous Continuous PRN Brown, Kristina C, CRNA   Stopped at 11/02/19 1420  . phenylephrine (NEO-SYNEPHRINE) injection   Intravenous Anesthesia Intra-op Fletcher-Harrison, Tawana, CRNA   200 mcg at 11/02/19 1435  . propofol (DIPRIVAN) 10 mg/mL bolus/IV push   Intravenous Anesthesia Intra-op Fletcher-Harrison, Tawana, CRNA   50 mg at 11/02/19 1131  . rocuronium (ZEMURON) injection   Intravenous Anesthesia Intra-op Fletcher-Harrison, Tawana, CRNA   20 mg at 11/02/19 1347  . sugammadex sodium  (BRIDION) injection   Intravenous Anesthesia Intra-op Fletcher-Harrison, Tawana, CRNA   480 mg at 11/02/19 1447    PHYSICAL EXAMINATION: ECOG PERFORMANCE STATUS: 0 - Asymptomatic  Vitals:   03/06/20 1348  BP: 104/88  Pulse: (!) 105  Resp: 17  Temp: 98.1 F (36.7 C)  SpO2: 100%   Filed Weights   03/06/20 1348  Weight: 189 lb 11.2 oz (86 kg)    GENERAL:alert, no distress and comfortable SKIN: skin color, texture, turgor are normal, no rashes or significant lesions EYES: normal, Conjunctiva are pink and non-injected, sclera clear NECK: supple, thyroid normal size, non-tender, without nodularity LYMPH:  no palpable lymphadenopathy in the cervical, axillary  LUNGS: clear to auscultation and percussion with normal breathing effort HEART: regular rate & rhythm and no murmurs and no lower extremity edema ABDOMEN:abdomen soft, non-tender and normal bowel sounds Musculoskeletal:no cyanosis of digits and no clubbing  NEURO: alert & oriented x 3 with fluent speech, no focal motor/sensory deficits Breasts: Breast inspection showed them to be symmetrical with no nipple discharge. Palpation of the breasts and axilla revealed no obvious mass that I could appreciate, (+) mild skin hyperpigmentation and mild lymphedema of the right breast.   LABORATORY DATA:  I have reviewed the data as listed CBC Latest Ref Rng & Units 03/06/2020 01/03/2020 11/03/2019  WBC 4.0 - 10.5 K/uL 4.4 5.0 11.9(H)  Hemoglobin 12.0 - 15.0 g/dL 13.6 14.3 11.4(L)  Hematocrit 36.0 - 46.0 % 42.2 43.6 34.4(L)  Platelets 150 - 400 K/uL 279 299 292     CMP Latest Ref Rng & Units 03/06/2020 01/03/2020 11/03/2019  Glucose 70 - 99 mg/dL 115(H) 94 124(H)  BUN 8 - 23 mg/dL 12 14 9  Creatinine 0.44 - 1.00 mg/dL 0.79 0.65 0.59  Sodium 135 - 145 mmol/L 143 140 141  Potassium 3.5 - 5.1 mmol/L 3.8 4.5 3.7  Chloride 98 - 111 mmol/L 109 105 106  CO2 22 - 32 mmol/L 25 28 26  Calcium 8.9 - 10.3 mg/dL 9.6 10.1 8.5(L)  Total Protein  6.5 - 8.1 g/dL 7.2   6.7 6.4(L)  Total Bilirubin 0.3 - 1.2 mg/dL 0.3 0.4 0.4  Alkaline Phos 38 - 126 U/L 37(L) - 37(L)  AST 15 - 41 U/L _0 ALT 0 - 44 U/L _1 RADIOGRAPHIC STUDIES: I have personally reviewed the radiological images as listed and agreed with the findings in the report. No results found.   ASSESSMENT & PLAN:  Cheryl Knight is a 65 y.o. female with   1. Malignant neoplasm of lower-outer quadrant of right breast, StageIA, p(T1bN0M0), ER/PR+, HER2-,Grade I -She was diagnosed in 02/2019 with right breast invasive ductal carcinoma. She underwent right lumpectomy with SLNB on 03/28/19 by Dr Georgette Dover. Her path showed 0.9 cm mass complete resected with clear margins, node negative. Given her small tumor and low Grade, this is likely low risk disease and adjuvant chemotherapy was not recommended. She completed adjuvant radiation.  -She started antiestrogen therapy with Tamoxifen 92m once daily in 06/2019.  She has been tolerating very well -She is clinically doing well, asymptomatic, exam was unremarkable, no clinical concern for cancer recurrence -Continue tamoxifen and breast cancer surveillance. -[facility] -Lab and follow-up with NP Lacie in 6 months     2. Genetic testing -She had mother with late age breast cancer and father with prostate cancer in his 535s  -I will check to see if she is eligible for genetic testing. She is interested if so.   3. Comorbidities: Osteoarthritis, Rheumatoid arthritis, Migraines, high cholesterol, hypothyroidism, HTN, GERD -She is on Methotrexate twice a week for her arthritis which controls RA very well. She was switched to Arava 238m  -Continue medication for migraines. She may use Norco once a month.  -Continue HTN, cholesterol and thyroid medication and f/u with PCP. All well controlled -She does still have cough from lisinopril which was recently stopped. She uses  inhaler as needed.  -She has been able to lose weight lately. I encouraged her to continue and watch diet and weight on Tamoxifen.  -She has h/o of Esophogeal issues and required dilation. She still has mild dysphagia with carbs.   4. Osteopenia  -Her 02/22/19 DEXA showed osteopenia with lowest T-score is -1.4 at left femur, no high risk for fracture yet.  -I discussed AI can reduce her bone density and Tamoxifen can strengthen it. Will monitor with repeat DEXA scan every 2 years.  -She will continue calcium and Vit D supplements.    PLAN: -Continue tamoxifen -[facility] -Lab and follow-up with Lacie in 6 months   No problem-specific Assessment & Plan notes found for this encounter.   No orders of the defined types were placed in this encounter.  All questions were answered. The patient knows to call the clinic with any problems, questions or concerns. No barriers to learning was detected. The total time spent in the appointment was 30 minutes.     YaTruitt MerleMD 03/06/2020   I, AmJoslyn Devonam acting as scribe for YaTruitt MerleMD.   I have reviewed the above documentation for accuracy and completeness, and I agree with the above.

## 2020-03-04 ENCOUNTER — Other Ambulatory Visit: Payer: Self-pay

## 2020-03-04 MED ORDER — PRAVASTATIN SODIUM 40 MG PO TABS: 40.0000 mg | ORAL_TABLET | Freq: Every evening | ORAL | 1 refills | Status: DC

## 2020-03-06 ENCOUNTER — Inpatient Hospital Stay: Payer: Medicare HMO | Attending: Hematology | Admitting: Hematology

## 2020-03-06 ENCOUNTER — Inpatient Hospital Stay: Payer: Medicare HMO

## 2020-03-06 ENCOUNTER — Ambulatory Visit: Payer: Medicare HMO

## 2020-03-06 ENCOUNTER — Encounter: Payer: Self-pay | Admitting: Hematology

## 2020-03-06 ENCOUNTER — Other Ambulatory Visit: Payer: Self-pay

## 2020-03-06 VITALS — BP 104/88 | HR 105 | Temp 98.1°F | Resp 17 | Ht 68.0 in | Wt 189.7 lb

## 2020-03-06 DIAGNOSIS — I10 Essential (primary) hypertension: Secondary | ICD-10-CM | POA: Diagnosis not present

## 2020-03-06 DIAGNOSIS — K219 Gastro-esophageal reflux disease without esophagitis: Secondary | ICD-10-CM | POA: Insufficient documentation

## 2020-03-06 DIAGNOSIS — Z170 Estrogen receptor positive status [ER+]: Secondary | ICD-10-CM

## 2020-03-06 DIAGNOSIS — C50511 Malignant neoplasm of lower-outer quadrant of right female breast: Secondary | ICD-10-CM | POA: Insufficient documentation

## 2020-03-06 DIAGNOSIS — Z79899 Other long term (current) drug therapy: Secondary | ICD-10-CM | POA: Diagnosis not present

## 2020-03-06 DIAGNOSIS — M069 Rheumatoid arthritis, unspecified: Secondary | ICD-10-CM | POA: Diagnosis not present

## 2020-03-06 DIAGNOSIS — M85852 Other specified disorders of bone density and structure, left thigh: Secondary | ICD-10-CM | POA: Diagnosis not present

## 2020-03-06 LAB — CMP (CANCER CENTER ONLY)
ALT: 12 U/L (ref 0–44)
AST: 19 U/L (ref 15–41)
Albumin: 3.8 g/dL (ref 3.5–5.0)
Alkaline Phosphatase: 37 U/L — ABNORMAL LOW (ref 38–126)
Anion gap: 9 (ref 5–15)
BUN: 12 mg/dL (ref 8–23)
CO2: 25 mmol/L (ref 22–32)
Calcium: 9.6 mg/dL (ref 8.9–10.3)
Chloride: 109 mmol/L (ref 98–111)
Creatinine: 0.79 mg/dL (ref 0.44–1.00)
GFR, Estimated: >60 mL/min (ref >60)
Glucose, Bld: 115 mg/dL — ABNORMAL HIGH (ref 70–99)
Potassium: 3.8 mmol/L (ref 3.5–5.1)
Sodium: 143 mmol/L (ref 135–145)
Total Bilirubin: 0.3 mg/dL (ref 0.3–1.2)
Total Protein: 7.2 g/dL (ref 6.5–8.1)

## 2020-03-06 LAB — CBC WITH DIFFERENTIAL (CANCER CENTER ONLY)
Abs Immature Granulocytes: 0.01 10*3/uL (ref 0.00–0.07)
Basophils Absolute: 0.1 10*3/uL (ref 0.0–0.1)
Basophils Relative: 2 %
Eosinophils Absolute: 0.2 10*3/uL (ref 0.0–0.5)
Eosinophils Relative: 4 %
HCT: 42.2 % (ref 36.0–46.0)
Hemoglobin: 13.6 g/dL (ref 12.0–15.0)
Immature Granulocytes: 0 %
Lymphocytes Relative: 50 %
Lymphs Abs: 2.2 10*3/uL (ref 0.7–4.0)
MCH: 29.6 pg (ref 26.0–34.0)
MCHC: 32.2 g/dL (ref 30.0–36.0)
MCV: 91.9 fL (ref 80.0–100.0)
Monocytes Absolute: 0.6 10*3/uL (ref 0.1–1.0)
Monocytes Relative: 13 %
Neutro Abs: 1.4 10*3/uL — ABNORMAL LOW (ref 1.7–7.7)
Neutrophils Relative %: 31 %
Platelet Count: 279 10*3/uL (ref 150–400)
RBC: 4.59 MIL/uL (ref 3.87–5.11)
RDW: 13.2 % (ref 11.5–15.5)
WBC Count: 4.4 10*3/uL (ref 4.0–10.5)
nRBC: 0 % (ref 0.0–0.2)

## 2020-03-10 DIAGNOSIS — Z853 Personal history of malignant neoplasm of breast: Secondary | ICD-10-CM | POA: Diagnosis not present

## 2020-03-10 LAB — HM MAMMOGRAPHY

## 2020-03-11 ENCOUNTER — Telehealth: Payer: Self-pay | Admitting: Internal Medicine

## 2020-03-11 ENCOUNTER — Ambulatory Visit (INDEPENDENT_AMBULATORY_CARE_PROVIDER_SITE_OTHER): Payer: Medicare HMO

## 2020-03-11 ENCOUNTER — Encounter: Payer: Self-pay | Admitting: Internal Medicine

## 2020-03-11 ENCOUNTER — Other Ambulatory Visit: Payer: Self-pay

## 2020-03-11 DIAGNOSIS — Z23 Encounter for immunization: Secondary | ICD-10-CM

## 2020-03-11 DIAGNOSIS — I10 Essential (primary) hypertension: Secondary | ICD-10-CM

## 2020-03-11 MED ORDER — AMLODIPINE BESYLATE 2.5 MG PO TABS: 2.5000 mg | ORAL_TABLET | Freq: Every day | ORAL | 3 refills | Status: DC

## 2020-03-11 NOTE — Telephone Encounter (Signed)
BP elevated on benicar 20-12.5 mg daily  I want to add norvasc 2.5 mg daily to the above BP 106-154/70s-80s    Monitor BP and mail form back in 2 weeks again

## 2020-03-12 ENCOUNTER — Telehealth: Payer: Self-pay | Admitting: Internal Medicine

## 2020-03-12 NOTE — Telephone Encounter (Signed)
Patient responded via mychart and was notified in Patient message encounter

## 2020-03-12 NOTE — Telephone Encounter (Signed)
Prior authorization has been submitted for patient's Flexeril.   Awaiting approval or denial.

## 2020-03-13 ENCOUNTER — Other Ambulatory Visit: Payer: Self-pay | Admitting: Internal Medicine

## 2020-03-13 ENCOUNTER — Encounter: Payer: Self-pay | Admitting: Hematology

## 2020-03-13 DIAGNOSIS — E039 Hypothyroidism, unspecified: Secondary | ICD-10-CM

## 2020-03-18 ENCOUNTER — Encounter: Payer: Self-pay | Admitting: Internal Medicine

## 2020-03-19 ENCOUNTER — Encounter: Payer: Self-pay | Admitting: Internal Medicine

## 2020-03-21 ENCOUNTER — Other Ambulatory Visit: Payer: Self-pay | Admitting: Internal Medicine

## 2020-03-21 DIAGNOSIS — B370 Candidal stomatitis: Secondary | ICD-10-CM

## 2020-03-21 MED ORDER — NYSTATIN 100000 UNIT/ML MT SUSP
5.0000 mL | Freq: Four times a day (QID) | OROMUCOSAL | 0 refills | Status: DC
Start: 2020-03-21 — End: 2020-04-08

## 2020-03-21 MED ORDER — FLUCONAZOLE 150 MG PO TABS: 150.0000 mg | ORAL_TABLET | Freq: Once | ORAL | 0 refills | Status: AC

## 2020-03-24 ENCOUNTER — Other Ambulatory Visit: Payer: Self-pay | Admitting: Physician Assistant

## 2020-03-24 DIAGNOSIS — M0579 Rheumatoid arthritis with rheumatoid factor of multiple sites without organ or systems involvement: Secondary | ICD-10-CM

## 2020-03-24 NOTE — Telephone Encounter (Signed)
Last Visit: 12/18/2019 Next Visit: 05/13/2020 Labs: 03/06/2020, Glucose 115, Alkaline Phosphatase 37, Neutro Abs 1.4   Current Dose per office note 12/18/2019, Arava 20 mg p.o. daily DX:  Rheumatoid arthritis involving multiple sites with positive rheumatoid factor   Last Fill: 12/26/2019  Okay to refill Arava?

## 2020-03-30 ENCOUNTER — Encounter: Payer: Self-pay | Admitting: Internal Medicine

## 2020-04-04 ENCOUNTER — Encounter: Payer: Self-pay | Admitting: Rheumatology

## 2020-04-08 ENCOUNTER — Ambulatory Visit: Payer: Medicare HMO | Admitting: Pulmonary Disease

## 2020-04-08 ENCOUNTER — Encounter: Payer: Self-pay | Admitting: Pulmonary Disease

## 2020-04-08 ENCOUNTER — Other Ambulatory Visit: Payer: Self-pay

## 2020-04-08 VITALS — BP 124/68 | HR 65 | Temp 97.5°F | Ht 68.0 in | Wt 188.4 lb

## 2020-04-08 DIAGNOSIS — J301 Allergic rhinitis due to pollen: Secondary | ICD-10-CM

## 2020-04-08 DIAGNOSIS — R059 Cough, unspecified: Secondary | ICD-10-CM

## 2020-04-08 DIAGNOSIS — R918 Other nonspecific abnormal finding of lung field: Secondary | ICD-10-CM | POA: Diagnosis not present

## 2020-04-08 DIAGNOSIS — M0579 Rheumatoid arthritis with rheumatoid factor of multiple sites without organ or systems involvement: Secondary | ICD-10-CM

## 2020-04-08 NOTE — Patient Instructions (Signed)
I am glad that you are doing much better.  Consider Zyrtec if Claritin is not working for your allergies.  We will see you as needed.  Continue follow-up with Dr. Terese Door.

## 2020-04-08 NOTE — Progress Notes (Signed)
Subjective:    Patient ID: Cheryl Knight, female    DOB: [birth year], 65 y.o.   MRN: [id number]  Chief Complaint  Patient presents with  . Follow-up    No current sx.    HPI Patient is a 65 year old remote former smoker (total of approximately 7 pack years) who follows up for the issue of cough and recent high-resolution CT scan of the chest.  Patient has rheumatoid arthritis.  Patient was initially seen in May 2021 initially treated for GERD with PPI twice a day and initially cough got somewhat better.  She was also treated for presumed asthma however PFTs failed to show significant convincing finding of asthma.  Symbicort was subsequently discontinued.  Was last seen by me on July 2021 and referred to GI at that time for evaluation.  She followed up in August with our APP Derl Barrow, NP and a CT scan high resolution was obtained.  CT scan was performed on 26 September 2019.  This showed some mild interstitial changes of tubular bronchiectasis in the lower lobes.  There is evidence of tiny centrilobular pulmonary nodules most concentrated in the upper lobes.  There is minimal peripheral interstitial opacities in the lung.  All the findings are subtle.  During her visit with our APP she also had PPI switched to [place].    She continues to have issues with cough despite being on Dexilant.  I last saw her 08 October 2019 and schedule her at that time for bronchoscopy to evaluate her lung findings further.  She was scheduled for 27 September at 1 PM.  She subsequently canceled the appointment at the time because she was not feeling well.  She was with urinary tract infection and subsequently in mid October was noted to have acute cholecystitis and required robotic assisted cholecystectomy.  Since that time her cough has resolved.  He has not had any reflux symptoms.  She is off of PPI.  She has not had any fevers, chills or sweats.  She has not had any dyspnea.  Had issues with allergic rhinitis but  no other major issues.  Claritin occasionally helps, has not tried Zyrtec.  She is not enthusiastic about proceeding with bronchoscopy that was previously scheduled and she canceled due to illness.  She feels well and looks well today.   Review of Systems A 10 point review of systems was performed and it is as noted above otherwise negative.  Patient Active Problem List   Diagnosis Date Noted  . Hypophosphatemia 02/28/2020  . Acute cholecystitis 11/01/2019  . Pulmonary fibrosis ([place]) 10/01/2019  . Pulmonary nodules 10/01/2019  . Bronchiectasis without complication ([place]) 34/19/3790  . Aortic atherosclerosis ([place]) 10/01/2019  . Hypertension associated with diabetes ([place]) 10/01/2019  . Paresthesia of upper extremity 10/01/2019  . Hiatal hernia 08/16/2019  . Esophagitis 08/16/2019  . Persistent cough 08/16/2019  . Dry eye syndrome 08/05/2019  . Glaucoma 08/05/2019  . Hypertensive retinopathy 08/05/2019  . Arteriosclerosis 08/05/2019  . Pterygium 08/05/2019  . Ocular rosacea 08/05/2019  . Genetic testing 05/02/2019  . Family history of breast cancer   . Family history of prostate cancer   . Family history of skin cancer   . Family history of lung cancer   . Malignant neoplasm of lower-outer quadrant of right breast of female, estrogen receptor positive ([place]) 03/19/2019  . Type 2 diabetes mellitus with hyperglycemia, without long-term current use of insulin ([place]) 02/08/2019  . Allergic rhinitis 02/08/2019  . Osteoarthritis   .  Primary osteoarthritis involving multiple joints 11/15/2017  . Moderate asthma with acute exacerbation 05/03/2017  . Routine general medical examination at a health care facility 02/08/2017  . Primary osteoarthritis of both feet 02/10/2016  . Hx of migraine headaches 02/07/2016  . History of hypothyroidism 02/07/2016  . History of gastroesophageal reflux (GERD) 02/07/2016  . Obesity (BMI 30-39.9) 12/30/2015  . Basal cell carcinoma of chest wall  10/05/2011  . Hypertension 09/01/2010  . Hyperlipidemia 02/27/2008  . Hypothyroidism 02/21/2007  . Migraine 02/21/2007  . VAGINITIS, ATROPHIC 02/21/2007  . Psoriasis 02/21/2007  . Sicca syndrome, unspecified 02/21/2007  . GERD 06/29/2006  . Rheumatoid arthritis ([place]) 06/29/2006   Allergies  Allergen Reactions  . Pentazocine Lactate Other (See Comments)    Hallucinations  . Demerol [Meperidine] Nausea And Vomiting  . Lisinopril Cough    Current Meds  Medication Sig  . amLODipine (NORVASC) 2.5 MG tablet Take 1 tablet (2.5 mg total) by mouth daily.  Marland Kitchen azelastine (ASTELIN) 0.1 % nasal spray Place 1 spray into both nostrils 2 (two) times daily.  . Calcium Carbonate Antacid (TUMS CHEWY BITES PO) Take 1 tablet by mouth at bedtime as needed (acid reflux).  . cyclobenzaprine (FLEXERIL) 10 MG tablet Take 1 tablet (10 mg total) by mouth daily.  . famotidine-calcium carbonate-magnesium hydroxide (PEPCID COMPLETE) 10-800-165 MG chewable tablet Chew 1 tablet by mouth daily as needed (acid reflux).  Marland Kitchen glucosamine-chondroitin 500-400 MG tablet Take 2 tablets by mouth daily.   Marland Kitchen leflunomide (ARAVA) 20 MG tablet TAKE 1 TABLET(20 MG) BY MOUTH DAILY  . levothyroxine (SYNTHROID) 100 MCG tablet TAKE 1 TABLET BY MOUTH 30 MINUTES WITH FOOD  . loratadine (CLARITIN) 10 MG tablet Take 10 mg by mouth every other day.   . loratadine-pseudoephedrine (CLARITIN-D 24-HOUR) 10-240 MG 24 hr tablet Take 1 tablet by mouth every other day.  . metFORMIN (GLUCOPHAGE XR) 500 MG 24 hr tablet Take 1 tablet (500 mg total) by mouth daily with breakfast.  . mometasone (ELOCON) 0.1 % ointment Apply topically in the morning and at bedtime. X 7-10 days  . Multiple Vitamin (MULTIVITAMIN WITH MINERALS) TABS tablet Take 1 tablet by mouth daily.  . mupirocin ointment (BACTROBAN) 2 % Apply 1 application topically 2 (two) times daily. Nose  . olmesartan-hydrochlorothiazide (BENICAR HCT) 20-12.5 MG tablet Take 1 tablet by mouth daily.  In am  . omeprazole (PRILOSEC) 40 MG capsule Take 40 mg by mouth daily.  . pravastatin (PRAVACHOL) 40 MG tablet Take 1 tablet (40 mg total) by mouth at bedtime.  . promethazine (PHENERGAN) 25 MG tablet Take 1 tablet (25 mg total) by mouth 2 (two) times daily as needed. (Patient taking differently: Take 25 mg by mouth 2 (two) times daily as needed for nausea or vomiting.)  . RESTASIS 0.05 % ophthalmic emulsion Place 1 drop into both eyes 2 (two) times daily.   . SUMAtriptan (IMITREX) 50 MG tablet Take 1 tablet (50 mg total) by mouth every 2 (two) hours as needed for migraine.  . tacrolimus (PROTOPIC) 0.1 % ointment Apply topically 2 (two) times daily. Bid until better  . tamoxifen (NOLVADEX) 20 MG tablet TAKE 1 TABLET(20 MG) BY MOUTH DAILY  . topiramate (TOPAMAX) 100 MG tablet TAKE 1 TABLET(100 MG) BY MOUTH DAILY   Immunization History  Administered Date(s) Administered  . Fluad Quad(high Dose 65+) 03/11/2020  . H1N1 02/27/2008  . Influenza-Unspecified 11/18/2017  . PFIZER(Purple Top)SARS-COV-2 Vaccination 03/02/2019, 03/29/2019, 01/01/2020  . Pneumococcal Conjugate-13 10/03/2019  . Td 01/19/2004  . Tdap  12/17/2014       Objective:   Physical Exam BP 124/68 (BP Location: Left Arm, Cuff Size: Normal)   Pulse 65   Temp (!) 97.5 F (36.4 C) (Temporal)   Ht 5\' 8"  (1.727 m)   Wt 188 lb 6.4 oz (85.5 kg)   SpO2 97%   BMI 28.65 kg/m  GENERAL:Overweightwoman, no acute distress. Fully ambulatory. HEAD: Normocephalic, atraumatic.  EYES: Pupils equal, round, reactive to light. No scleral icterus.  MOUTH:Nose/mouth/throat not examined due to masking requirements for COVID 19. NECK: Supple. No thyromegaly. Trachea midline. No JVD. No adenopathy. PULMONARY:Good air entry bilaterally,no adventitious sounds.  CARDIOVASCULAR: S1 and S2. Regular rate and rhythm.Grade 1/6 systolic ejection murmur left sternal border, unchanged from prior. GASTROINTESTINAL:Obese,  benign. MUSCULOSKELETAL: No joint deformity, no clubbing, no edema.  NEUROLOGIC:Awake, alert, oriented, fully ambulatory no gait disturbance noted. Speech is fluent, no overt focal deficits. SKIN: Intact,warm,dry.Limited exam shows no rashes. PSYCH:Mood and behavior appropriate     Assessment & Plan:     ICD-10-CM   1. Cough  R05.9    Resolved after cholecystectomy Not an issue currently  2. Abnormal findings on diagnostic imaging of lung  R91.8    Patient wants to continue to defer bronchoscopy  3. Rheumatoid arthritis involving multiple sites with positive rheumatoid factor (HCC)  M05.79    This issue adds complexity to her management  4. Seasonal allergic rhinitis due to pollen  J30.1    Trial of Zyrtec if Claritin is not effective   We will see the patient as needed.  Currently she wants to continue to defer bronchoscopy.  She does not endorse any respiratory symptoms.  She will continue follow-up with her primary care physician Dr. Terese Door and with her rheumatologist Dr. Estanislado Pandy.  Renold Don, MD [facility]   *This note was dictated using voice recognition software/Dragon.  Despite best efforts to proofread, errors can occur which can change the meaning.  Any change was purely unintentional.

## 2020-04-16 ENCOUNTER — Encounter: Payer: Self-pay | Admitting: Surgery

## 2020-04-16 NOTE — Transfer of Care (Addendum)
Immediate Anesthesia Transfer of Care Note  Patient: Cheryl Knight  Procedure(s) Performed: XI ROBOTIC ASSISTED LAPAROSCOPIC CHOLECYSTECTOMY (N/A )  Patient Location: PACU  Anesthesia Type:General  Level of Consciousness: awake, alert  and oriented  Airway & Oxygen Therapy: Patient Spontanous Breathing and Patient connected to nasal cannula oxygen  Post-op Assessment: Report given to RN, Post -op Vital signs reviewed and stable and Patient moving all extremities  Post vital signs: stable  Last Vitals:  Vitals Value Taken Time  BP 124/68 04/08/20 0833  Temp 36.4 C 04/08/20 0833  Pulse 65 04/08/20 0833  Resp 17 03/06/20 1348  SpO2 97 % 04/08/20 0833    Last Pain:  Vitals:   11/03/19 1000  TempSrc:   PainSc: 0-No pain         Complications: No complications documented.

## 2020-04-21 ENCOUNTER — Encounter: Payer: Self-pay | Admitting: Rheumatology

## 2020-04-22 ENCOUNTER — Other Ambulatory Visit: Payer: Self-pay

## 2020-04-22 DIAGNOSIS — Z79899 Other long term (current) drug therapy: Secondary | ICD-10-CM | POA: Diagnosis not present

## 2020-04-23 LAB — COMPLETE METABOLIC PANEL WITH GFR
AG Ratio: 2 (calc) (ref 1.0–2.5)
ALT: 35 U/L — ABNORMAL HIGH (ref 6–29)
AST: 26 U/L (ref 10–35)
Albumin: 4.2 g/dL (ref 3.6–5.1)
Alkaline phosphatase (APISO): 32 U/L — ABNORMAL LOW (ref 37–153)
BUN: 13 mg/dL (ref 7–25)
CO2: 28 mmol/L (ref 20–32)
Calcium: 9.7 mg/dL (ref 8.6–10.4)
Chloride: 106 mmol/L (ref 98–110)
Creat: 0.64 mg/dL (ref 0.50–0.99)
GFR, Est African American: 108 mL/min/{1.73_m2} (ref >=60)
GFR, Est Non African American: 93 mL/min/{1.73_m2} (ref >=60)
Globulin: 2.1 g/dL (calc) (ref 1.9–3.7)
Glucose, Bld: 111 mg/dL — ABNORMAL HIGH (ref 65–99)
Potassium: 4.2 mmol/L (ref 3.5–5.3)
Sodium: 142 mmol/L (ref 135–146)
Total Bilirubin: 0.3 mg/dL (ref 0.2–1.2)
Total Protein: 6.3 g/dL (ref 6.1–8.1)

## 2020-04-23 LAB — CBC WITH DIFFERENTIAL/PLATELET
Absolute Monocytes: 530 cells/uL (ref 200–950)
Basophils Absolute: 61 cells/uL (ref 0–200)
Basophils Relative: 1.2 %
Eosinophils Absolute: 189 cells/uL (ref 15–500)
Eosinophils Relative: 3.7 %
HCT: 40.9 % (ref 35.0–45.0)
Hemoglobin: 13.3 g/dL (ref 11.7–15.5)
Lymphs Abs: 2331 cells/uL (ref 850–3900)
MCH: 29.8 pg (ref 27.0–33.0)
MCHC: 32.5 g/dL (ref 32.0–36.0)
MCV: 91.5 fL (ref 80.0–100.0)
MPV: 10.5 fL (ref 7.5–12.5)
Monocytes Relative: 10.4 %
Neutro Abs: 1989 cells/uL (ref 1500–7800)
Neutrophils Relative %: 39 %
Platelets: 318 10*3/uL (ref 140–400)
RBC: 4.47 10*6/uL (ref 3.80–5.10)
RDW: 12.3 % (ref 11.0–15.0)
Total Lymphocyte: 45.7 %
WBC: 5.1 10*3/uL (ref 3.8–10.8)

## 2020-04-23 NOTE — Progress Notes (Signed)
CBC is normal, ALT (liver function) is elevated at 35.  She is on Lucky and statins which could be contributing to elevated liver function.  We will continue to monitor.

## 2020-04-29 NOTE — Progress Notes (Signed)
Office Visit Note  Patient: Cheryl Knight             Date of Birth: 1955-06-23           MRN: [id number]             PCP: McLean-Scocuzza, Nino Glow, MD Referring: Orland Mustard * Visit Date: 05/13/2020 Occupation: @GUAROCC @  Subjective:  Medication management.   History of Present Illness: Cheryl Knight is a 65 y.o. female of seropositive rheumatoid arthritis, osteoarthritis, ILD, psoriasis.  She denies any increased joint pain or joint swelling currently.  She was seen by Dr. Patsey Berthold for ILD who felt that her disease is stable and she does not require follow-up visit.  She states her asthma is not bothering her this year.  She has few psoriasis patches for which she uses topical agents.  She has occasional tingling and numbness in her hands and also some lower back pain off and on.  Activities of Daily Living:  Patient reports morning stiffness for 5 minutes.   Patient Denies nocturnal pain.  Difficulty dressing/grooming: Denies Difficulty climbing stairs: Reports Difficulty getting out of chair: Denies Difficulty using hands for taps, buttons, cutlery, and/or writing: Reports  Review of Systems  Constitutional: Negative for fatigue, night sweats, weight gain and weight loss.  HENT: Positive for mouth dryness and nose dryness. Negative for mouth sores, trouble swallowing and trouble swallowing.   Eyes: Positive for itching and dryness. Negative for pain, redness and visual disturbance.  Respiratory: Negative for cough, shortness of breath and difficulty breathing.   Cardiovascular: Negative for chest pain, palpitations, hypertension, irregular heartbeat and swelling in legs/feet.  Gastrointestinal: Negative for blood in stool, constipation and diarrhea.  Endocrine: Negative for increased urination.  Genitourinary: Negative for difficulty urinating and vaginal dryness.  Musculoskeletal: Positive for morning stiffness. Negative for arthralgias, joint pain, joint  swelling, myalgias, muscle weakness, muscle tenderness and myalgias.  Skin: Negative for color change, rash, hair loss, redness, skin tightness, ulcers and sensitivity to sunlight.  Allergic/Immunologic: Positive for susceptible to infections.  Neurological: Positive for numbness and parasthesias. Negative for dizziness, headaches, memory loss, night sweats and weakness.  Hematological: Negative for bruising/bleeding tendency and swollen glands.  Psychiatric/Behavioral: Negative for depressed mood, confusion and sleep disturbance. The patient is not nervous/anxious.     PMFS History:  Patient Active Problem List   Diagnosis Date Noted  . Hypophosphatemia 02/28/2020  . Acute cholecystitis 11/01/2019  . Pulmonary fibrosis ([place]) 10/01/2019  . Pulmonary nodules 10/01/2019  . Bronchiectasis without complication ([place]) 63/01/6008  . Aortic atherosclerosis ([place]) 10/01/2019  . Hypertension associated with diabetes ([place]) 10/01/2019  . Paresthesia of upper extremity 10/01/2019  . Hiatal hernia 08/16/2019  . Esophagitis 08/16/2019  . Persistent cough 08/16/2019  . Dry eye syndrome 08/05/2019  . Glaucoma 08/05/2019  . Hypertensive retinopathy 08/05/2019  . Arteriosclerosis 08/05/2019  . Pterygium 08/05/2019  . Ocular rosacea 08/05/2019  . Genetic testing 05/02/2019  . Family history of breast cancer   . Family history of prostate cancer   . Family history of skin cancer   . Family history of lung cancer   . Malignant neoplasm of lower-outer quadrant of right breast of female, estrogen receptor positive ([place]) 03/19/2019  . Type 2 diabetes mellitus with hyperglycemia, without long-term current use of insulin ([place]) 02/08/2019  . Allergic rhinitis 02/08/2019  . Osteoarthritis   . Primary osteoarthritis involving multiple joints 11/15/2017  . Moderate asthma with acute exacerbation 05/03/2017  . Routine general medical  examination at a health care facility 02/08/2017  . Primary osteoarthritis  of both feet 02/10/2016  . Hx of migraine headaches 02/07/2016  . History of hypothyroidism 02/07/2016  . History of gastroesophageal reflux (GERD) 02/07/2016  . Obesity (BMI 30-39.9) 12/30/2015  . Basal cell carcinoma of chest wall 10/05/2011  . Hypertension 09/01/2010  . Hyperlipidemia 02/27/2008  . Hypothyroidism 02/21/2007  . Migraine 02/21/2007  . VAGINITIS, ATROPHIC 02/21/2007  . Psoriasis 02/21/2007  . Sicca syndrome, unspecified 02/21/2007  . GERD 06/29/2006  . Rheumatoid arthritis ([place]) 06/29/2006    Past Medical History:  Diagnosis Date  . Cancer ([place]) 02/2019   right breast IDC  . Cataracts, bilateral    tbd surgery 2021  . Complication of anesthesia   . Cough   . Difficult intubation    TRACH AT 19  . Dyspnea    W/ COUGHING  . Family history of breast cancer   . Family history of lung cancer   . Family history of prostate cancer   . Family history of skin cancer   . GERD (gastroesophageal reflux disease)   . Headache   . High cholesterol   . History of hiatal hernia   . Hypertension   . Hypothyroidism   . Irritable bowel   . Osteoarthritis    i.e right knee   . PONV (postoperative nausea and vomiting)   . Pre-diabetes   . Rheumatoid arthritis ([place])   . Thyroid disease     Family History  Problem Relation Age of Onset  . Hypertension Mother   . Thyroid disease Mother   . Breast cancer Mother 83       breast dx age 41 died age 60   . Prostate cancer Father 25       metastatic  . Stroke Maternal Grandmother   . Skin cancer Maternal Uncle        dx. in his 51s  . Lung cancer Maternal Uncle        smoker, died age 23   Past Surgical History:  Procedure Laterality Date  . ABDOMINAL HYSTERECTOMY     age 47 ovaries out  . BREAST LUMPECTOMY WITH RADIOACTIVE SEED AND SENTINEL LYMPH NODE BIOPSY Right 03/28/2019   Procedure: RIGHT BREAST LUMPECTOMY WITH RADIOACTIVE SEED AND SENTINEL LYMPH NODE BIOPSY;  Surgeon: Donnie Mesa, MD;  Location: [facility];  Service: General;  Laterality: Right;  PEC BLOCK FOR POST OP PAIN  . BREAST SURGERY    . CATARACT EXTRACTION, BILATERAL    . CHOLECYSTECTOMY     10/2019  . ESOPHAGEAL DILATION     Dr. Olevia Perches 15 years from 2021  . TONSILLECTOMY    . 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 Fluad Quad(high Dose 65+) 03/11/2020  . H1N1 02/27/2008  . Influenza-Unspecified 11/18/2017  . PFIZER(Purple Top)SARS-COV-2 Vaccination 03/02/2019, 03/29/2019, 01/01/2020  . Pneumococcal Conjugate-13 10/03/2019  . Td 01/19/2004  . Tdap 12/17/2014     Objective: Vital Signs: BP 111/74 (BP Location: Left Arm, Patient Position: Sitting, Cuff Size: Normal)   Pulse 99   Resp 15   Ht 5\' 8"  (1.727 m)   Wt 186 lb 6.4 oz (84.6 kg)   BMI 28.34 kg/m    Physical Exam Vitals and nursing note reviewed.  Constitutional:      Appearance: She is  well-developed.  HENT:     Head: Normocephalic and atraumatic.  Eyes:     Conjunctiva/sclera: Conjunctivae normal.  Cardiovascular:     Rate and Rhythm: Normal rate and regular rhythm.     Heart sounds: Normal heart sounds.  Pulmonary:     Effort: Pulmonary effort is normal.     Breath sounds: Normal breath sounds.  Abdominal:     General: Bowel sounds are normal.     Palpations: Abdomen is soft.  Musculoskeletal:     Cervical back: Normal range of motion.  Lymphadenopathy:     Cervical: No cervical adenopathy.  Skin:    General: Skin is warm and dry.     Capillary Refill: Capillary refill takes less than 2 seconds.  Neurological:     Mental Status: She is alert and oriented to person, place, and time.  Psychiatric:        Behavior: Behavior normal.      Musculoskeletal Exam: C-spine was in good range of motion.  She has some discomfort raising her arms.  Shoulder joints, elbow joints, wrist joints, MCPs PIPs and DIPs  with good range of motion with no synovitis.  She has prominence of PIP and DIP joints.  Hip joints, knee joints, ankles, MTPs and PIPs with good range of motion with no synovitis.  CDAI Exam: CDAI Score: 0.4  Patient Global: 2 mm; Provider Global: 2 mm Swollen: 0 ; Tender: 0  Joint Exam 05/13/2020   No joint exam has been documented for this visit   There is currently no information documented on the homunculus. Go to the Rheumatology activity and complete the homunculus joint exam.  Investigation: No additional findings.  Imaging: No results found.  Recent Labs: Lab Results  Component Value Date   WBC 5.1 04/22/2020   HGB 13.3 04/22/2020   PLT 318 04/22/2020   NA 142 04/22/2020   K 4.2 04/22/2020   CL 106 04/22/2020   CO2 28 04/22/2020   GLUCOSE 111 (H) 04/22/2020   BUN 13 04/22/2020   CREATININE 0.64 04/22/2020   BILITOT 0.3 04/22/2020   ALKPHOS 37 (L) 03/06/2020   AST 26 04/22/2020   ALT 35 (H) 04/22/2020   PROT 6.3 04/22/2020   ALBUMIN 3.8 03/06/2020   CALCIUM 9.7 04/22/2020   GFRAA 108 04/22/2020   QFTBGOLDPLUS NEGATIVE 04/05/2019    Speciality Comments: No specialty comments available.  Procedures:  No procedures performed Allergies: Pentazocine lactate, Demerol [meperidine], and Lisinopril   Assessment / Plan:     Visit Diagnoses: Rheumatoid arthritis involving multiple sites with positive rheumatoid factor (HCC) - +RF.  Continues to have some stiffness but had no synovitis on my examination today.  She denies any history of joint swelling.  High risk medication use - Arava 20 mg p.o. daily.  (Previously on Methotrexate 0.8 ml every 7 days and folic acid 1 mg 2 tablets daily.)  Labs from April 22, 2020 were unremarkable except for mild elevation of LFTs.  Patient believes is due to tamoxifen use.  She will get labs in 3 months and then every 3 months to monitor for drug toxicity.  She has been advised to stop leflunomide in case she develops an infection and  restart the medication if in the infection resolves.  She is fully vaccinated against COVID-19, and pneumococcal vaccine.  She has been advised to get Shingrix vaccine.  ILD (interstitial lung disease) ([place]) - diagnosed on the basis of high-resolution CT. patient is followed by Dr. Patsey Berthold.  Patient  states that Dr. Patsey Berthold advised her to come on.  Basis and did not feel her disease is active.  Patient denies any shortness of breath  Sicca syndrome (HCC)-she continues to have sicca symptoms.  Over-the-counter products have been helpful.  She requested a prescription for pilocarpine which was filled today.  Primary osteoarthritis of both knees-currently not symptomatic.  Primary osteoarthritis of both feet-modifying her shoes has been helpful.  Psoriasis-I did not notice any psoriasis patches on examination.  She only had some dry skin.  History of asthma - patient has been diagnosed with asthma by Dr. Patsey Berthold.  History of hyperlipidemia-increased risk of heart disease with rheumatoid arthritis was discussed.  Instructions regarding dietary modifications and exercise were placed in the AVS.  History of migraine  History of hypothyroidism  History of gastroesophageal reflux (GERD)  Malignant neoplasm of lower-outer quadrant of right breast of female, estrogen receptor positive ([place])  Orders: No orders of the defined types were placed in this encounter.  Meds ordered this encounter  Medications  . pilocarpine (SALAGEN) 5 MG tablet    Sig: Take 1 tablet (5 mg total) by mouth 3 (three) times daily.    Dispense:  90 tablet    Refill:  2     Follow-Up Instructions: Return in about 5 months (around 10/13/2020) for Rheumatoid arthritis.   Bo Merino, MD  Note - This record has been created using Editor, commissioning.  Chart creation errors have been sought, but may not always  have been located. Such creation errors do not reflect on  the standard of medical care.

## 2020-05-13 ENCOUNTER — Encounter: Payer: Self-pay | Admitting: Rheumatology

## 2020-05-13 ENCOUNTER — Other Ambulatory Visit: Payer: Self-pay

## 2020-05-13 ENCOUNTER — Ambulatory Visit: Payer: Medicare HMO | Admitting: Rheumatology

## 2020-05-13 VITALS — BP 111/74 | HR 99 | Resp 15 | Ht 68.0 in | Wt 186.4 lb

## 2020-05-13 DIAGNOSIS — M0579 Rheumatoid arthritis with rheumatoid factor of multiple sites without organ or systems involvement: Secondary | ICD-10-CM

## 2020-05-13 DIAGNOSIS — Z8639 Personal history of other endocrine, nutritional and metabolic disease: Secondary | ICD-10-CM | POA: Diagnosis not present

## 2020-05-13 DIAGNOSIS — C50511 Malignant neoplasm of lower-outer quadrant of right female breast: Secondary | ICD-10-CM

## 2020-05-13 DIAGNOSIS — M19072 Primary osteoarthritis, left ankle and foot: Secondary | ICD-10-CM

## 2020-05-13 DIAGNOSIS — M3500 Sicca syndrome, unspecified: Secondary | ICD-10-CM | POA: Diagnosis not present

## 2020-05-13 DIAGNOSIS — M170 Bilateral primary osteoarthritis of knee: Secondary | ICD-10-CM

## 2020-05-13 DIAGNOSIS — L409 Psoriasis, unspecified: Secondary | ICD-10-CM

## 2020-05-13 DIAGNOSIS — J849 Interstitial pulmonary disease, unspecified: Secondary | ICD-10-CM

## 2020-05-13 DIAGNOSIS — Z8669 Personal history of other diseases of the nervous system and sense organs: Secondary | ICD-10-CM | POA: Diagnosis not present

## 2020-05-13 DIAGNOSIS — Z79899 Other long term (current) drug therapy: Secondary | ICD-10-CM

## 2020-05-13 DIAGNOSIS — M19071 Primary osteoarthritis, right ankle and foot: Secondary | ICD-10-CM

## 2020-05-13 DIAGNOSIS — Z8709 Personal history of other diseases of the respiratory system: Secondary | ICD-10-CM | POA: Diagnosis not present

## 2020-05-13 DIAGNOSIS — Z8719 Personal history of other diseases of the digestive system: Secondary | ICD-10-CM

## 2020-05-13 DIAGNOSIS — Z170 Estrogen receptor positive status [ER+]: Secondary | ICD-10-CM

## 2020-05-13 MED ORDER — PILOCARPINE HCL 5 MG PO TABS: 5.0000 mg | ORAL_TABLET | Freq: Three times a day (TID) | ORAL | 2 refills | Status: DC

## 2020-05-13 NOTE — Patient Instructions (Signed)
Standing Labs We placed an order today for your standing lab work.   Please have your standing labs drawn in July and every 3 months  If possible, please have your labs drawn 2 weeks prior to your appointment so that the provider can discuss your results at your appointment.  We have open lab daily Monday through Thursday from 1:30-4:30 PM and Friday from 1:30-4:00 PM at the office of Dr. Nathon Stefanski, [facility].   Please be advised, all patients with office appointments requiring lab work will take precedents over walk-in lab work.  If possible, please come for your lab work on Monday and Friday afternoons, as you may experience shorter wait times. The office is located at [address], [address], [place], [place] [address] No appointment is necessary.   Labs are drawn by [facility]. Please bring your co-pay at the time of your lab draw.  You may receive a bill from [facility] for your lab work.  If you wish to have your labs drawn at another location, please call the office 24 hours in advance to send orders.  If you have any questions regarding directions or hours of operation,  please call [phone].   As a reminder, please drink plenty of water prior to coming for your lab work. Thanks!  Vaccines You are taking a medication(s) that can suppress your immune system.  The following immunizations are recommended: . Flu annually . Covid-19  . Pneumonia (Pneumovax 23 and Prevnar 13 spaced at least 1 year apart) . Shingrix (after age 50)  Please check with your PCP to make sure you are up to date.  Heart Disease Prevention   Your inflammatory disease increases your risk of heart disease which includes heart attack, stroke, atrial fibrillation (irregular heartbeats), high blood pressure, heart failure and atherosclerosis (plaque in the arteries).  It is important to reduce your risk by:   . Keep blood pressure, cholesterol, and blood sugar at healthy levels   . Smoking  Cessation   . Maintain a healthy weight  o BMI 20-25   . Eat a healthy diet  o Plenty of fresh fruit, vegetables, and whole grains  o Limit saturated fats, foods high in sodium, and added sugars  o DASH and Mediterranean diet   . Increase physical activity  o Recommend moderate physically activity for 150 minutes per week/ 30 minutes a day for five days a week These can be broken up into three separate ten-minute sessions during the day.   . Reduce Stress  . Meditation, slow breathing exercises, yoga, coloring books  . Dental visits twice a year   

## 2020-05-16 ENCOUNTER — Encounter: Payer: Self-pay | Admitting: Pulmonary Disease

## 2020-06-17 ENCOUNTER — Telehealth: Payer: Self-pay | Admitting: Internal Medicine

## 2020-06-17 ENCOUNTER — Other Ambulatory Visit: Payer: Self-pay | Admitting: Hematology

## 2020-06-17 NOTE — Telephone Encounter (Signed)
Prior authorization has been submitted for patient's Cyclobenzaprine   Awaiting approval or denial.

## 2020-06-21 ENCOUNTER — Other Ambulatory Visit: Payer: Self-pay | Admitting: Physician Assistant

## 2020-06-21 DIAGNOSIS — M0579 Rheumatoid arthritis with rheumatoid factor of multiple sites without organ or systems involvement: Secondary | ICD-10-CM

## 2020-06-23 NOTE — Telephone Encounter (Signed)
Next Visit: 10/14/2020  Last Visit: 05/13/2020  Last Fill: 03/24/2020  DX: Rheumatoid arthritis involving multiple sites with positive rheumatoid factor   Current Dose per office note 05/13/2020: Arava 20 mg p.o. daily  Labs: 04/22/2020 CBC is normal, ALT (liver function) is elevated at 35  Okay to refill Arava?

## 2020-06-24 ENCOUNTER — Other Ambulatory Visit: Payer: Self-pay

## 2020-06-24 ENCOUNTER — Encounter: Payer: Self-pay | Admitting: Internal Medicine

## 2020-06-24 ENCOUNTER — Ambulatory Visit (INDEPENDENT_AMBULATORY_CARE_PROVIDER_SITE_OTHER): Payer: Medicare HMO | Admitting: Internal Medicine

## 2020-06-24 VITALS — BP 118/70 | HR 102 | Temp 98.2°F | Ht 68.0 in | Wt 181.6 lb

## 2020-06-24 DIAGNOSIS — R110 Nausea: Secondary | ICD-10-CM

## 2020-06-24 DIAGNOSIS — K760 Fatty (change of) liver, not elsewhere classified: Secondary | ICD-10-CM | POA: Insufficient documentation

## 2020-06-24 DIAGNOSIS — E785 Hyperlipidemia, unspecified: Secondary | ICD-10-CM | POA: Diagnosis not present

## 2020-06-24 DIAGNOSIS — G43009 Migraine without aura, not intractable, without status migrainosus: Secondary | ICD-10-CM | POA: Diagnosis not present

## 2020-06-24 DIAGNOSIS — I10 Essential (primary) hypertension: Secondary | ICD-10-CM

## 2020-06-24 DIAGNOSIS — R748 Abnormal levels of other serum enzymes: Secondary | ICD-10-CM

## 2020-06-24 DIAGNOSIS — E039 Hypothyroidism, unspecified: Secondary | ICD-10-CM

## 2020-06-24 DIAGNOSIS — Z0000 Encounter for general adult medical examination without abnormal findings: Secondary | ICD-10-CM | POA: Diagnosis not present

## 2020-06-24 DIAGNOSIS — G43709 Chronic migraine without aura, not intractable, without status migrainosus: Secondary | ICD-10-CM

## 2020-06-24 DIAGNOSIS — I152 Hypertension secondary to endocrine disorders: Secondary | ICD-10-CM

## 2020-06-24 DIAGNOSIS — E1159 Type 2 diabetes mellitus with other circulatory complications: Secondary | ICD-10-CM

## 2020-06-24 MED ORDER — PRAVASTATIN SODIUM 40 MG PO TABS: 40.0000 mg | ORAL_TABLET | Freq: Every evening | ORAL | 3 refills | Status: DC

## 2020-06-24 MED ORDER — PROMETHAZINE HCL 25 MG PO TABS: 25.0000 mg | ORAL_TABLET | Freq: Two times a day (BID) | ORAL | 5 refills | Status: DC | PRN

## 2020-06-24 NOTE — Patient Instructions (Addendum)
Consider CT abdomen pelvis in the future   Nonalcoholic Fatty Liver Disease Diet, Adult Nonalcoholic fatty liver disease is a condition that causes fat to build up in and around the liver. The disease makes it harder for the liver to work the way that it should. Following a healthy diet can help to keep nonalcoholic fatty liver disease under control. It can also help to prevent or improve conditions that are associated with the disease, such as heart disease, diabetes, high blood pressure, and abnormal cholesterol levels. Along with regular exercise, this diet:  Promotes weight loss.  Helps to control blood sugar levels.  Helps to improve the way that the body uses insulin. What are tips for following this plan? Reading food labels Always check food labels for:  The amount of saturated fat in a food. You should limit your intake of saturated fat. Saturated fat is found in foods that come from animals, including meat and dairy products such as butter, cheese, and whole milk.  The amount of fiber in a food. You should choose high-fiber foods such as fruits, vegetables, and whole grains. Try to get 25-30 grams (g) of fiber a day.   Cooking  When cooking, use heart-healthy oils that are high in monounsaturated fats. These include olive oil, canola oil, and avocado oil.  Limit frying or deep-frying foods. Cook foods using healthy methods such as baking, boiling, steaming, and grilling instead. Meal planning  You may want to keep track of how many calories you take in. Eating the right amount of calories will help you achieve a healthy weight. Meeting with a registered dietitian can help you get started.  Limit how often you eat takeout and fast food. These foods are usually very high in fat, salt, and sugar.  Use the glycemic index (GI) to plan your meals. The index tells you how quickly a food will raise your blood sugar. Choose low-GI foods (GI less than 55). These foods take a longer time  to raise blood sugar. A registered dietitian can help you identify foods lower on the GI scale. Lifestyle  You may want to follow a Mediterranean diet. This diet includes a lot of vegetables, lean meats or fish, whole grains, fruits, and healthy oils and fats. What foods can I eat? Fruits Bananas. Apples. Oranges. Grapes. Papaya. Mango. Pomegranate. Kiwi. Grapefruit. Cherries. Vegetables Lettuce. Spinach. Peas. Beets. Cauliflower. Cabbage. Broccoli. Carrots. Tomatoes. Squash. Eggplant. Herbs. Peppers. Onions. Cucumbers. Brussels sprouts. Yams and sweet potatoes. Beans. Lentils. Grains Whole wheat or whole-grain foods, including breads, crackers, cereals, and pasta. Stone-ground whole wheat. Unsweetened oatmeal. Bulgur. Barley. Quinoa. Brown or wild rice. Corn or whole wheat flour tortillas. Meats and other proteins Lean meats. Poultry. Tofu. Seafood and shellfish. Dairy Low-fat or fat-free dairy products, such as yogurt, cottage cheese, or cheese. Beverages Water. Sugar-free drinks. Tea. Coffee. Low-fat or skim milk. Milk alternatives, such as soy or almond milk. Real fruit juice. Fats and oils Avocado. Canola or olive oil. Nuts and nut butters. Seeds. Seasonings and condiments Mustard. Relish. Low-fat, low-sugar ketchup and barbecue sauce. Low-fat or fat-free mayonnaise. Sweets and desserts Sugar-free sweets. The items listed above may not be a complete list of foods and beverages you can eat. Contact a dietitian for more information.   What foods should I limit or avoid? Meats and other proteins Limit red meat to 1-2 times a week. Dairy [employer]. Fats and oils Palm oil and coconut oil. Fried foods. Other foods Processed foods. Foods that contain a  lot of salt or sodium. Sweets and desserts Sweets that contain sugar. Beverages Sweetened drinks, such as sweet tea, milkshakes, iced sweet drinks, and sodas. Alcohol. The items listed above may not be a complete list of foods  and beverages you should avoid. Contact a dietitian for more information. Where to find more information The [employer] of Diabetes and Digestive and Kidney Diseases: [employer] Summary  Nonalcoholic fatty liver disease is a condition that causes fat to build up in and around the liver.  Following a healthy diet can help to keep nonalcoholic fatty liver disease under control. Your diet should be rich in fruits, vegetables, whole grains, and lean proteins.  Limit your intake of saturated fat. Saturated fat is found in foods that come from animals, including meat and dairy products such as butter, cheese, and whole milk.  This diet promotes weight loss, helps to control blood sugar levels, and helps to improve the way that the body uses insulin. This information is not intended to replace advice given to you by your health care provider. Make sure you discuss any questions you have with your health care provider. Document Revised: 04/28/2018 Document Reviewed: 01/26/2018 Elsevier Patient Education  2021 [employer].  Fatty Liver Disease  The liver converts food into energy, removes toxic material from the blood, makes important proteins, and absorbs necessary vitamins from food. Fatty liver disease occurs when too much fat has built up in your liver cells. Fatty liver disease is also called hepatic steatosis. In many cases, fatty liver disease does not cause symptoms or problems. It is often diagnosed when tests are being done for other reasons. However, over time, fatty liver can cause inflammation that may lead to more serious liver problems, such as scarring of the liver (cirrhosis) and liver failure. Fatty liver is associated with insulin resistance, increased body fat, high blood pressure (hypertension), and high cholesterol. These are features of metabolic syndrome and increase your risk for stroke, diabetes, and heart disease. What are the causes? This condition may be caused  by components of metabolic syndrome:  Obesity.  Insulin resistance.  High cholesterol. Other causes:  Alcohol abuse.  Poor nutrition.  Cushing syndrome.  Pregnancy.  Certain drugs.  Poisons.  Some viral infections. What increases the risk? You are more likely to develop this condition if you:  Abuse alcohol.  Are overweight.  Have diabetes.  Have hepatitis.  Have a high triglyceride level.  Are pregnant. What are the signs or symptoms? Fatty liver disease often does not cause symptoms. If symptoms do develop, they can include:  Fatigue and weakness.  Weight loss.  Confusion.  Nausea, vomiting, or abdominal pain.  Yellowing of your skin and the white parts of your eyes (jaundice).  Itchy skin. How is this diagnosed? This condition may be diagnosed by:  A physical exam and your medical history.  Blood tests.  Imaging tests, such as an ultrasound, CT scan, or MRI.  A liver biopsy. A small sample of liver tissue is removed using a needle. The sample is then looked at under a microscope. How is this treated? Fatty liver disease is often caused by other health conditions. Treatment for fatty liver may involve medicines and lifestyle changes to manage conditions such as:  Alcoholism.  High cholesterol.  Diabetes.  Being overweight or obese. Follow these instructions at home:  Do not drink alcohol. If you have trouble quitting, ask your health care provider how to safely quit with the help of medicine or a  supervised program. This is important to keep your condition from getting worse.  Eat a healthy diet as told by your health care provider. Ask your health care provider about working with a dietitian to develop an eating plan.  Exercise regularly. This can help you lose weight and control your cholesterol and diabetes. Talk to your health care provider about an exercise plan and which activities are best for you.  Take over-the-counter and  prescription medicines only as told by your health care provider.  Keep all follow-up visits. This is important.   Contact a health care provider if:  You have trouble controlling your: ? Blood sugar. This is especially important if you have diabetes. ? Cholesterol. ? Drinking of alcohol. Get help right away if:  You have abdominal pain.  You have jaundice.  You have nausea and are vomiting.  You vomit blood or material that looks like coffee grounds.  You have stools that are black, tar-like, or bloody. Summary  Fatty liver disease develops when too much fat builds up in the cells of your liver.  Fatty liver disease often causes no symptoms or problems. However, over time, fatty liver can cause inflammation that may lead to more serious liver problems, such as scarring of the liver (cirrhosis).  You are more likely to develop this condition if you abuse alcohol, are pregnant, are overweight, have diabetes, have hepatitis, or have high triglyceride or cholesterol levels.  Contact your health care provider if you have trouble controlling your blood sugar, cholesterol, or drinking of alcohol. This information is not intended to replace advice given to you by your health care provider. Make sure you discuss any questions you have with your health care provider. Document Revised: 10/18/2019 Document Reviewed: 10/18/2019 Elsevier Patient Education  [employer].

## 2020-06-24 NOTE — Progress Notes (Signed)
Chief Complaint  Patient presents with  . Follow-up   Annual  1. bp controlled on norvasc 2.5 mg qd benicar hctz 20-12.5 mg qd  2. C/o early satiety eating less losing weight trying, and c/o nausea ? If related to meds vs other has h/o breast cancer on tamoxifen and using phenerghan for nausea  3. Hypothyroidism on synthroid 100 mcg qd will check labs A1c, TSH, CMET, lipid, phos with labs 08/28/20 Dr. Burr Medico h/o  Review of Systems  Constitutional: Positive for weight loss.  HENT: Negative for hearing loss.   Eyes: Negative for blurred vision.  Respiratory: Negative for shortness of breath.   Cardiovascular: Negative for chest pain.  Gastrointestinal: Negative for abdominal pain.  Musculoskeletal: Negative for falls and joint pain.  Skin: Negative for rash.  Neurological: Negative for headaches.  Psychiatric/Behavioral: Negative for depression.   Past Medical History:  Diagnosis Date  . Cancer ([place]) 02/2019   right breast IDC  . Cataracts, bilateral    tbd surgery 2021  . Complication of anesthesia   . Cough   . Difficult intubation    TRACH AT 19  . Dyspnea    W/ COUGHING  . Family history of breast cancer   . Family history of lung cancer   . Family history of prostate cancer   . Family history of skin cancer   . GERD (gastroesophageal reflux disease)   . Headache   . High cholesterol   . History of hiatal hernia   . Hypertension   . Hypothyroidism   . Irritable bowel   . Osteoarthritis    i.e right knee   . PONV (postoperative nausea and vomiting)   . Pre-diabetes   . Rheumatoid arthritis ([place])   . Thyroid disease    Past Surgical History:  Procedure Laterality Date  . ABDOMINAL HYSTERECTOMY     age 65 ovaries out  . BREAST LUMPECTOMY WITH RADIOACTIVE SEED AND SENTINEL LYMPH NODE BIOPSY Right 03/28/2019   Procedure: RIGHT BREAST LUMPECTOMY WITH RADIOACTIVE SEED AND SENTINEL LYMPH NODE BIOPSY;  Surgeon: Donnie Mesa, MD;  Location: [facility];   Service: General;  Laterality: Right;  PEC BLOCK FOR POST OP PAIN  . BREAST SURGERY    . CATARACT EXTRACTION, BILATERAL    . CHOLECYSTECTOMY     10/2019  . ESOPHAGEAL DILATION     Dr. Olevia Perches 15 years from 2021  . TONSILLECTOMY    . TUBAL LIGATION     Family History  Problem Relation Age of Onset  . Hypertension Mother   . Thyroid disease Mother   . Breast cancer Mother 73       breast dx age 45 died age 17   . Prostate cancer Father 59       metastatic  . Stroke Maternal Grandmother   . Skin cancer Maternal Uncle        dx. in his 67s  . Lung cancer Maternal Uncle        smoker, died age 34   Social History   Socioeconomic History  . Marital status: Married    Spouse name: Not on file  . Number of children: 2  . Years of education: Not on file  . Highest education level: Not on file  Occupational History  . Occupation: nurse  Tobacco Use  . Smoking status: Former Smoker    Packs/day: 0.25    Years: 7.00    Pack years: 1.75    Types: Cigarettes    Quit  date: 2000    Years since quitting: 22.4  . Smokeless tobacco: Never Used  Vaping Use  . Vaping Use: Never used  Substance and Sexual Activity  . Alcohol use: Yes    Comment: occ  . Drug use: Never  . Sexual activity: Yes  Other Topics Concern  . Not on file  Social History Narrative   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   Has a beach house    Social Determinants of Health   Financial Resource Strain: Low Risk   . Difficulty of Paying Living Expenses: Not hard at all  Food Insecurity: No Food Insecurity  . Worried About Charity fundraiser in the Last Year: Never true  . Ran Out of Food in the Last Year: Never true  Transportation Needs: No Transportation Needs  . Lack of Transportation (Medical): No  . Lack of Transportation (Non-Medical): No  Physical Activity: Not on file  Stress: No Stress Concern Present  . Feeling of Stress : Not at all  Social Connections: Unknown  . Frequency of Communication with Friends  and Family: Not on file  . Frequency of Social Gatherings with Friends and Family: Not on file  . Attends Religious Services: Not on file  . Active Member of Clubs or Organizations: Not on file  . Attends Archivist Meetings: Not on file  . Marital Status: Married  Human resources officer Violence: Not At Risk  . Fear of Current or Ex-Partner: No  . Emotionally Abused: No  . Physically Abused: No  . Sexually Abused: No   Current Meds  Medication Sig  . amLODipine (NORVASC) 2.5 MG tablet Take 1 tablet (2.5 mg total) by mouth daily.  . Calcium Carbonate Antacid (TUMS CHEWY BITES PO) Take 1 tablet by mouth at bedtime as needed (acid reflux).  . cyclobenzaprine (FLEXERIL) 10 MG tablet Take 1 tablet (10 mg total) by mouth daily.  . Esomeprazole Magnesium (NEXIUM PO) Take by mouth in the morning.  . famotidine-calcium carbonate-magnesium hydroxide (PEPCID COMPLETE) 10-800-165 MG chewable tablet Chew 1 tablet by mouth at bedtime.  Marland Kitchen glucosamine-chondroitin 500-400 MG tablet Take 2 tablets by mouth daily.   Marland Kitchen leflunomide (ARAVA) 20 MG tablet TAKE 1 TABLET(20 MG) BY MOUTH DAILY  . levothyroxine (SYNTHROID) 100 MCG tablet TAKE 1 TABLET BY MOUTH 30 MINUTES WITH FOOD  . loratadine (CLARITIN) 10 MG tablet Take 10 mg by mouth every other day.   . loratadine-pseudoephedrine (CLARITIN-D 24-HOUR) 10-240 MG 24 hr tablet Take 1 tablet by mouth every other day.  . metFORMIN (GLUCOPHAGE XR) 500 MG 24 hr tablet Take 1 tablet (500 mg total) by mouth daily with breakfast.  . Multiple Vitamin (MULTIVITAMIN WITH MINERALS) TABS tablet Take 1 tablet by mouth daily.  Marland Kitchen olmesartan-hydrochlorothiazide (BENICAR HCT) 20-12.5 MG tablet Take 1 tablet by mouth daily. In am  . pilocarpine (SALAGEN) 5 MG tablet Take 1 tablet (5 mg total) by mouth 3 (three) times daily.  . RESTASIS 0.05 % ophthalmic emulsion Place 1 drop into both eyes 2 (two) times daily.   . SUMAtriptan (IMITREX) 50 MG tablet Take 1 tablet (50 mg total)  by mouth every 2 (two) hours as needed for migraine.  . tamoxifen (NOLVADEX) 20 MG tablet TAKE 1 TABLET(20 MG) BY MOUTH DAILY  . topiramate (TOPAMAX) 100 MG tablet TAKE 1 TABLET(100 MG) BY MOUTH DAILY  . [DISCONTINUED] pravastatin (PRAVACHOL) 40 MG tablet Take 1 tablet (40 mg total) by mouth at bedtime.  . [DISCONTINUED] promethazine (PHENERGAN) 25 MG tablet  Take 1 tablet (25 mg total) by mouth 2 (two) times daily as needed. (Patient taking differently: Take 25 mg by mouth 2 (two) times daily as needed for nausea or vomiting.)   Allergies  Allergen Reactions  . Pentazocine Lactate Other (See Comments)    Hallucinations  . Demerol [Meperidine] Nausea And Vomiting  . Lisinopril Cough   Recent Results (from the past 2160 hour(s))  COMPLETE METABOLIC PANEL WITH GFR     Status: Abnormal   Collection Time: 04/22/20 11:59 AM  Result Value Ref Range   Glucose, Bld 111 (H) 65 - 99 mg/dL    Comment: .            Fasting reference interval . For someone without known diabetes, a glucose value between 100 and 125 mg/dL is consistent with prediabetes and should be confirmed with a follow-up test. .    BUN 13 7 - 25 mg/dL   Creat 0.64 0.50 - 0.99 mg/dL    Comment: For patients >22 years of age, the reference limit for Creatinine is approximately 13% higher for people identified as African-American. .    GFR, Est Non African American 93 > OR = 60 mL/min/1.5m   GFR, Est African American 108 > OR = 60 mL/min/1.749m  BUN/Creatinine Ratio NOT APPLICABLE 6 - 22 (calc)   Sodium 142 135 - 146 mmol/L   Potassium 4.2 3.5 - 5.3 mmol/L   Chloride 106 98 - 110 mmol/L   CO2 28 20 - 32 mmol/L   Calcium 9.7 8.6 - 10.4 mg/dL   Total Protein 6.3 6.1 - 8.1 g/dL   Albumin 4.2 3.6 - 5.1 g/dL   Globulin 2.1 1.9 - 3.7 g/dL (calc)   AG Ratio 2.0 1.0 - 2.5 (calc)   Total Bilirubin 0.3 0.2 - 1.2 mg/dL   Alkaline phosphatase (APISO) 32 (L) 37 - 153 U/L   AST 26 10 - 35 U/L   ALT 35 (H) 6 - 29 U/L  CBC  with Differential/Platelet     Status: None   Collection Time: 04/22/20 11:59 AM  Result Value Ref Range   WBC 5.1 3.8 - 10.8 Thousand/uL   RBC 4.47 3.80 - 5.10 Million/uL   Hemoglobin 13.3 11.7 - 15.5 g/dL   HCT 40.9 35.0 - 45.0 %   MCV 91.5 80.0 - 100.0 fL   MCH 29.8 27.0 - 33.0 pg   MCHC 32.5 32.0 - 36.0 g/dL   RDW 12.3 11.0 - 15.0 %   Platelets 318 140 - 400 Thousand/uL   MPV 10.5 7.5 - 12.5 fL   Neutro Abs 1,989 1,500 - 7,800 cells/uL   Lymphs Abs 2,331 850 - 3,900 cells/uL   Absolute Monocytes 530 200 - 950 cells/uL   Eosinophils Absolute 189 15 - 500 cells/uL   Basophils Absolute 61 0 - 200 cells/uL   Neutrophils Relative % 39 %   Total Lymphocyte 45.7 %   Monocytes Relative 10.4 %   Eosinophils Relative 3.7 %   Basophils Relative 1.2 %   Objective  Body mass index is 27.61 kg/m. Wt Readings from Last 3 Encounters:  06/24/20 181 lb 9.6 oz (82.4 kg)  05/13/20 186 lb 6.4 oz (84.6 kg)  04/08/20 188 lb 6.4 oz (85.5 kg)   Temp Readings from Last 3 Encounters:  06/24/20 98.2 F (36.8 C) (Oral)  04/08/20 (!) 97.5 F (36.4 C) (Temporal)  03/06/20 98.1 F (36.7 C) (Tympanic)   BP Readings from Last 3 Encounters:  06/24/20 118/70  05/13/20 111/74  04/08/20 124/68   Pulse Readings from Last 3 Encounters:  06/24/20 (!) 102  05/13/20 99  04/08/20 65    Physical Exam Vitals and nursing note reviewed.  Constitutional:      Appearance: Normal appearance. She is well-developed, well-groomed and overweight.  HENT:     Head: Normocephalic and atraumatic.  Eyes:     Conjunctiva/sclera: Conjunctivae normal.     Pupils: Pupils are equal, round, and reactive to light.  Cardiovascular:     Rate and Rhythm: Normal rate and regular rhythm.     Heart sounds: Normal heart sounds. No murmur heard.   Pulmonary:     Effort: Pulmonary effort is normal.     Breath sounds: Normal breath sounds.  Abdominal:     Tenderness: There is no abdominal tenderness.  Skin:     General: Skin is warm and dry.  Neurological:     General: No focal deficit present.     Mental Status: She is alert and oriented to person, place, and time. Mental status is at baseline.     Gait: Gait normal.  Psychiatric:        Attention and Perception: Attention and perception normal.        Mood and Affect: Mood and affect normal.        Speech: Speech normal.        Behavior: Behavior normal. Behavior is cooperative.        Thought Content: Thought content normal.        Cognition and Memory: Cognition and memory normal.        Judgment: Judgment normal.     Assessment  Plan  Annual physical exam Flu shotdeclinesconsider in future  Tdap utd due in 2026  prevnarutd 10/03/19 pna 23disc today could consider in the future shingrixrx today 06/24/20 covid 19 vaccine3/3 pfizer consider booster   rheumatology Dr. Estanislado Pandy labd 04/2020  Skindermatology Dr. Ubaldo Glassing est saw summer 2020 f/u yearly h/o nmsc chestappt scheduledas of 08/28/19 [facility] 2/4/21abnormal and repeat 03/09/19 right breast mass right breast rec biopsy did she get this Dr. Burr Medico onc appt 03/06/20, 03/10/20 negative [facility]   Paps/p total hysterectomy due to uterine prolapse h/o abnormal pap remotely ovaries removed  Colonoscopywill referDr. Alice Reichert had and EGD 08/14/19 with bxsneg barrete colonoscopy 5 years  dexaget solisosteopenia2/4/21 with mammo  Former smoker quit 1 year ago in 2019/2020 but ocass. Light smoking 2x per year socially Eyes doing aftercataract surgery 02/13/19 Dr. Herbert Deaner Surgery note 03/21/19 Dr. Georgette Dover   INVASIVE DUCTAL CARCINOMA OF RIGHT BREAST, STAGE 1 (C50.911) Impression: RLOQ 5 mm 5 cmfn IDC, ER/PR +, Her2 - 04/24/19 [facility] f/u Dr. Georgette Dover 03/28/19 right radioactive seed lumpectomy and sentinel ln bx invasive ductal ca 0.9 cm with some surrounding DCIS margins negative 2 nodes negative begin radiation next week with HT after radiation  F/u in 3 months On Tamoxifen as of  08/16/19 appt Dr. Darnell Level lung 09/18/19 to consider CT chest Hypertension, unspecified type - Plan: Comprehensive metabolic panel, CBC w/Diff  rec healthy diet and exercise   Fatty liver - Plan: Comprehensive metabolic panel,  Elevated liver enzymes - Consider CT ab pelvis with wt loss and early satiety in the future   Hypophosphatemia - Plan: Phosphorus  Hypothyroidism, unspecified type - Plan: TSH Levo 100 mcg qd   Chronic migraine without aura without status migrainosus, not intractable - Plan: promethazine (PHENERGAN) 25 MG tablet Migraine without aura and without status migrainosus, not intractable - Plan: promethazine (PHENERGAN)  25 MG tablet  Chronic nausea ? Related tamoxifen vs other- Plan: promethazine (PHENERGAN) 25 MG tablet See above consider CT ab/pelvis in the future   Hyperlipidemia, unspecified hyperlipidemia type - Plan: Lipid panel On pravachol 40 mg qhs  Labs check 08/28/20 with h/o appt labs 08/28/20   Hypertension associated with diabetes ([place]) - Plan: Hemoglobin A1c  BP controlled on norvasc 2.5 mg qd, benicar hct 20-12.5 mg qd and on metformin xr 500 mg qd  F/u eye MD as scheduled   Provider: Dr. Olivia Mackie McLean-Scocuz[facility]

## 2020-06-30 ENCOUNTER — Encounter: Payer: Self-pay | Admitting: Internal Medicine

## 2020-06-30 DIAGNOSIS — H16223 Keratoconjunctivitis sicca, not specified as Sjogren's, bilateral: Secondary | ICD-10-CM | POA: Diagnosis not present

## 2020-06-30 DIAGNOSIS — H40013 Open angle with borderline findings, low risk, bilateral: Secondary | ICD-10-CM | POA: Diagnosis not present

## 2020-06-30 DIAGNOSIS — H04123 Dry eye syndrome of bilateral lacrimal glands: Secondary | ICD-10-CM | POA: Diagnosis not present

## 2020-06-30 LAB — HM DIABETES EYE EXAM

## 2020-07-04 ENCOUNTER — Encounter: Payer: Self-pay | Admitting: Internal Medicine

## 2020-07-05 ENCOUNTER — Other Ambulatory Visit: Payer: Self-pay | Admitting: Rheumatology

## 2020-07-07 NOTE — Telephone Encounter (Signed)
Next Visit: 10/14/2020   Last Visit: 05/13/2020   Last Fill: 05/13/2020  Current Dose per office note on 05/13/2020, not discussed   Okay to refill per Dr. Estanislado Pandy

## 2020-08-06 ENCOUNTER — Telehealth: Payer: Self-pay | Admitting: Nurse Practitioner

## 2020-08-06 NOTE — Telephone Encounter (Signed)
Rescheduled upcoming appointment due to provider's template. Patient is aware of changes. 

## 2020-08-17 ENCOUNTER — Other Ambulatory Visit: Payer: Self-pay | Admitting: Internal Medicine

## 2020-08-17 DIAGNOSIS — M545 Low back pain, unspecified: Secondary | ICD-10-CM

## 2020-08-17 MED ORDER — CYCLOBENZAPRINE HCL 10 MG PO TABS: 10.0000 mg | ORAL_TABLET | Freq: Every day | ORAL | 1 refills | Status: DC

## 2020-08-28 ENCOUNTER — Telehealth: Payer: Medicare HMO | Admitting: Nurse Practitioner

## 2020-08-28 ENCOUNTER — Other Ambulatory Visit: Payer: Medicare HMO

## 2020-08-29 ENCOUNTER — Telehealth: Payer: Self-pay | Admitting: Hematology

## 2020-08-29 NOTE — Telephone Encounter (Signed)
Changed upcoming provider visit per 8/11 secure chat. Patient is aware of changes.

## 2020-09-01 ENCOUNTER — Encounter: Payer: Self-pay | Admitting: Nurse Practitioner

## 2020-09-01 ENCOUNTER — Inpatient Hospital Stay: Payer: Medicare HMO | Admitting: Nurse Practitioner

## 2020-09-01 ENCOUNTER — Other Ambulatory Visit: Payer: Self-pay

## 2020-09-01 ENCOUNTER — Inpatient Hospital Stay: Payer: Medicare HMO | Attending: Nurse Practitioner

## 2020-09-01 VITALS — BP 126/88 | HR 96 | Temp 98.0°F | Resp 18 | Wt 178.8 lb

## 2020-09-01 DIAGNOSIS — Z170 Estrogen receptor positive status [ER+]: Secondary | ICD-10-CM | POA: Diagnosis not present

## 2020-09-01 DIAGNOSIS — C50511 Malignant neoplasm of lower-outer quadrant of right female breast: Secondary | ICD-10-CM

## 2020-09-01 LAB — CMP (CANCER CENTER ONLY)
ALT: 13 U/L (ref 0–44)
AST: 18 U/L (ref 15–41)
Albumin: 4 g/dL (ref 3.5–5.0)
Alkaline Phosphatase: 37 U/L — ABNORMAL LOW (ref 38–126)
Anion gap: 13 (ref 5–15)
BUN: 19 mg/dL (ref 8–23)
CO2: 24 mmol/L (ref 22–32)
Calcium: 9.9 mg/dL (ref 8.9–10.3)
Chloride: 109 mmol/L (ref 98–111)
Creatinine: 0.73 mg/dL (ref 0.44–1.00)
GFR, Estimated: >60 mL/min (ref >60)
Glucose, Bld: 112 mg/dL — ABNORMAL HIGH (ref 70–99)
Potassium: 3.9 mmol/L (ref 3.5–5.1)
Sodium: 146 mmol/L — ABNORMAL HIGH (ref 135–145)
Total Bilirubin: 0.4 mg/dL (ref 0.3–1.2)
Total Protein: 7.2 g/dL (ref 6.5–8.1)

## 2020-09-01 LAB — CBC WITH DIFFERENTIAL (CANCER CENTER ONLY)
Abs Immature Granulocytes: 0.01 10*3/uL (ref 0.00–0.07)
Basophils Absolute: 0.1 10*3/uL (ref 0.0–0.1)
Basophils Relative: 2 %
Eosinophils Absolute: 0.2 10*3/uL (ref 0.0–0.5)
Eosinophils Relative: 3 %
HCT: 42.2 % (ref 36.0–46.0)
Hemoglobin: 13.8 g/dL (ref 12.0–15.0)
Immature Granulocytes: 0 %
Lymphocytes Relative: 47 %
Lymphs Abs: 2.3 10*3/uL (ref 0.7–4.0)
MCH: 30.1 pg (ref 26.0–34.0)
MCHC: 32.7 g/dL (ref 30.0–36.0)
MCV: 92.1 fL (ref 80.0–100.0)
Monocytes Absolute: 0.6 10*3/uL (ref 0.1–1.0)
Monocytes Relative: 13 %
Neutro Abs: 1.6 10*3/uL — ABNORMAL LOW (ref 1.7–7.7)
Neutrophils Relative %: 35 %
Platelet Count: 268 10*3/uL (ref 150–400)
RBC: 4.58 MIL/uL (ref 3.87–5.11)
RDW: 13 % (ref 11.5–15.5)
WBC Count: 4.7 10*3/uL (ref 4.0–10.5)
nRBC: 0 % (ref 0.0–0.2)

## 2020-09-01 MED ORDER — TAMOXIFEN CITRATE 20 MG PO TABS: ORAL_TABLET | ORAL | 3 refills | Status: DC

## 2020-09-01 NOTE — Progress Notes (Addendum)
[place]   [phone] [phone]   Clinic Follow up Note   Patient Care Team: McLean-Scocuzza, Cheryl Glow, MD as PCP - General (Internal Medicine) Cheryl Rudd, MD as Consulting Physician (Radiation Oncology) Cheryl Merle, MD as Consulting Physician (Hematology) Cheryl Mesa, MD as Consulting Physician (General Surgery) Cheryl Kaufmann, RN as Oncology Nurse Navigator Cheryl Germany, RN as Oncology Nurse Navigator Cheryl Feeling, NP as Nurse Practitioner (Nurse Practitioner) Date of Service: 09/01/2020  I connected with Cheryl Knight on 09/02/20 at 10:00 AM EDT by video enabled telemedicine visit and verified that I am speaking with the correct person using two identifiers.   I discussed the limitations, risks, security and privacy concerns of performing an evaluation and management service by telemedicine and the availability of in-person appointments. I also discussed with the patient that there may be a patient responsible charge related to this service. The patient expressed understanding and agreed to proceed.   Other persons participating in the visit and their role in the encounter: None   Patient's location: [facility] exam room Provider's location: home  CHIEF COMPLAINT: Follow up right breast cancer   SUMMARY OF ONCOLOGIC HISTORY: Oncology History Overview Note  Cancer Staging Malignant neoplasm of lower-outer quadrant of right breast of female, estrogen receptor positive ([place]) Staging form: Breast, AJCC 8th Edition - Clinical stage from 03/14/2019: Stage IA (cT1a, cN0, cM0, G1, ER+, PR+, HER2-) - Signed by Cheryl Merle, MD on 03/20/2019 - Pathologic stage from 03/28/2019: Stage IA (pT1b, pN0(sn), cM0, G1, ER+, PR+, HER2-) - Unsigned    Malignant neoplasm of lower-outer quadrant of right breast of female, estrogen receptor positive ([place])  02/22/2019 Imaging   Bone Density Scan  02/22/19 DEXA shows osteopenia with lowest T-score -1.4 at left  hip   03/09/2019 Mammogram   Diagnostic Mammogram 03/09/19 IMPRESSION the 90m mass in 7:00 posiiton of right breast suspicous of malignancy.     03/14/2019 Cancer Staging   Staging form: Breast, AJCC 8th Edition - Clinical stage from 03/14/2019: Stage IA (cT1a, cN0, cM0, G1, ER+, PR+, HER2-) - Signed by [name] MD on 03/20/2019   03/14/2019 Initial Biopsy   Diagnosis 03/14/19  Breast, right, needle core biopsy, 7 o'clock, 5cmfn - INVASIVE DUCTAL CARCINOMA. SEE NOTE Diagnosis Note Carcinoma measures 0.4 cm in greatest linear dimension and appears grade 1. Dr. CJeannie Donereviewed the case and concurs with the diagnosis. A breast prognostic profile (ER, PR, Ki-67 and HER2) is pending and will be reported in an addendum. Dr. BIsaiah Blakeswas notified on 11/12/2019.   03/14/2019 Receptors her2   PROGNOSTIC INDICATORS Results: IMMUNOHISTOCHEMICAL AND MORPHOMETRIC ANALYSIS PERFORMED MANUALLY The tumor cells are EQUIVOCAL for Her2 (2+). Her2 by FISH will be performed and results reported separately. Estrogen Receptor: 95%, POSITIVE, STRONG STAINING INTENSITY Progesterone Receptor: 90%, POSITIVE, MODERATE STAINING INTENSITY Proliferation Marker Ki67: 10%   03/19/2019 Initial Diagnosis   Malignant neoplasm of lower-outer quadrant of right breast of female, estrogen receptor positive ([place]   03/28/2019 Surgery   RIGHT BREAST LUMPECTOMY WITH RADIOACTIVE SEED AND SENTINEL LYMPH NODE BIOPSY by Dr Cheryl Knight   03/28/2019 Pathology Results   FINAL MICROSCOPIC DIAGNOSIS:   A. BREAST, RIGHT, LUMPECTOMY:  - Invasive ductal carcinoma, 0.9 cm, Nottingham grade 1 of 3.  - Ductal carcinoma in situ, intermediate nuclear grade.  - Margins of resection are not involved.       - Invasive carcinoma, closest margin: 3 mm, superior.       - In  situ carcinoma, closest margin: 3 mm, medial.  - Biopsy site.  - See oncology table.   B. SENTINEL LYMPH NODE, RIGHT AXILLARY #1, BIOPSY:  - One lymph node, negative for  carcinoma (0/1).   C. SENTINEL LYMPH NODE, RIGHT AXILLARY #2, BIOPSY:  - One lymph node, negative for carcinoma (0/1).    03/28/2019 Cancer Staging   Staging form: Breast, AJCC 8th Edition - Pathologic stage from 03/28/2019: Stage IA (pT1b, pN0(sn), cM0, G1, ER+, PR+, HER2-) - Signed by Cheryl Merle, MD on 06/03/2019   04/26/2019 Genetic Testing   Negative genetic testing:  No pathogenic variants detected on the [facility] Common Hereditary Cancers panel. The report date is 04/26/2019.  The Common Hereditary Cancers Panel offered by [facility] includes sequencing and/or deletion duplication testing of the following 48 genes: APC, ATM, AXIN2, BARD1, BMPR1A, BRCA1, BRCA2, BRIP1, CDH1, CDK4, CDKN2A (p14ARF), CDKN2A (p16INK4a), CHEK2, CTNNA1, DICER1, EPCAM (Deletion/duplication testing only), GREM1 (promoter region deletion/duplication testing only), KIT, MEN1, MLH1, MSH2, MSH3, MSH6, MUTYH, NBN, NF1, NHTL1, PALB2, PDGFRA, PMS2, POLD1, POLE, PTEN, RAD50, RAD51C, RAD51D, RNF43, SDHB, SDHC, SDHD, SMAD4, SMARCA4. STK11, TP53, TSC1, TSC2, and VHL.  The following genes were evaluated for sequence changes only: SDHA and HOXB13 c.251G>A variant only.    05/08/2019 - 06/04/2019 Radiation Therapy   Adjuvant Radiation with Dr Cheryl Knight    06/2019 -  Anti-estrogen oral therapy   Tamoxifen starting in 06/2019    09/04/2019 Survivorship   SCP delivered by Cheryl Rue, NP      CURRENT THERAPY: Tamoxifen starting 06/2019   INTERVAL HISTORY: Cheryl Knight presents for f/up as scheduled. She is doing well. Tolerating tamoxifen. She has joint aches that fluctuate with the weather. Hot flashes are "minor." Denies vaginal bleeding or signs of thrombosis. She does self breast exams and nothing is out of the norm, denies new lump/mass, nipple discharge, or skin changes. She continues RA treatment. She plans to get COVID booster soon.   All other systems were reviewed with the patient and are negative.  MEDICAL HISTORY:  Past Medical  History:  Diagnosis Date   Cancer ([place]) 02/2019   right breast IDC   Cataracts, bilateral    tbd surgery 6644   Complication of anesthesia    Cough    Difficult intubation    TRACH AT 21   Dyspnea    W/ COUGHING   Family history of breast cancer    Family history of lung cancer    Family history of prostate cancer    Family history of skin cancer    GERD (gastroesophageal reflux disease)    Headache    High cholesterol    History of hiatal hernia    Hypertension    Hypothyroidism    Irritable bowel    Osteoarthritis    i.e right knee    PONV (postoperative nausea and vomiting)    Pre-diabetes    Rheumatoid arthritis ([place])    Thyroid disease     SURGICAL HISTORY: Past Surgical History:  Procedure Laterality Date   ABDOMINAL HYSTERECTOMY     age 55 ovaries out   BREAST LUMPECTOMY WITH RADIOACTIVE SEED AND SENTINEL LYMPH NODE BIOPSY Right 03/28/2019   Procedure: RIGHT BREAST LUMPECTOMY WITH RADIOACTIVE SEED AND SENTINEL LYMPH NODE BIOPSY;  Surgeon: Cheryl Mesa, MD;  Location: [facility];  Service: General;  Laterality: Right;  PEC BLOCK FOR POST OP PAIN   BREAST SURGERY     CATARACT EXTRACTION, BILATERAL     CHOLECYSTECTOMY  10/2019   ESOPHAGEAL DILATION     Dr. Olevia Perches 15 years from [place]      I have reviewed the social history and family history with the patient and they are unchanged from previous note.  ALLERGIES:  is allergic to pentazocine lactate, demerol [meperidine], and lisinopril.  MEDICATIONS:  Current Outpatient Medications  Medication Sig Dispense Refill   pilocarpine (SALAGEN) 5 MG tablet TAKE 1 TABLET(5 MG) BY MOUTH THREE TIMES DAILY 90 tablet 2   amLODipine (NORVASC) 2.5 MG tablet Take 1 tablet (2.5 mg total) by mouth daily. 90 tablet 3   Calcium Carbonate Antacid (TUMS CHEWY BITES PO) Take 1 tablet by mouth at bedtime as needed (acid reflux).     cyclobenzaprine (FLEXERIL) 10 MG tablet Take 1  tablet (10 mg total) by mouth daily. prn 90 tablet 1   Esomeprazole Magnesium (NEXIUM PO) Take by mouth in the morning.     famotidine-calcium carbonate-magnesium hydroxide (PEPCID COMPLETE) 10-800-165 MG chewable tablet Chew 1 tablet by mouth at bedtime.     glucosamine-chondroitin 500-400 MG tablet Take 2 tablets by mouth daily.      leflunomide (ARAVA) 20 MG tablet TAKE 1 TABLET(20 MG) BY MOUTH DAILY 30 tablet 2   levothyroxine (SYNTHROID) 100 MCG tablet TAKE 1 TABLET BY MOUTH 30 MINUTES WITH FOOD 90 tablet 3   loratadine (CLARITIN) 10 MG tablet Take 10 mg by mouth every other day.      loratadine-pseudoephedrine (CLARITIN-D 24-HOUR) 10-240 MG 24 hr tablet Take 1 tablet by mouth every other day.     metFORMIN (GLUCOPHAGE XR) 500 MG 24 hr tablet Take 1 tablet (500 mg total) by mouth daily with breakfast. 90 tablet 3   Multiple Vitamin (MULTIVITAMIN WITH MINERALS) TABS tablet Take 1 tablet by mouth daily.     olmesartan-hydrochlorothiazide (BENICAR HCT) 20-12.5 MG tablet Take 1 tablet by mouth daily. In am 90 tablet 3   pravastatin (PRAVACHOL) 40 MG tablet Take 1 tablet (40 mg total) by mouth at bedtime. 90 tablet 3   promethazine (PHENERGAN) 25 MG tablet Take 1 tablet (25 mg total) by mouth 2 (two) times daily as needed for nausea or vomiting. 60 tablet 5   RESTASIS 0.05 % ophthalmic emulsion Place 1 drop into both eyes 2 (two) times daily.      SUMAtriptan (IMITREX) 50 MG tablet Take 1 tablet (50 mg total) by mouth every 2 (two) hours as needed for migraine.     tamoxifen (NOLVADEX) 20 MG tablet TAKE 1 TABLET(20 MG) BY MOUTH DAILY 90 tablet 3   topiramate (TOPAMAX) 100 MG tablet TAKE 1 TABLET(100 MG) BY MOUTH DAILY 90 tablet 3   No current facility-administered medications for this visit.    PHYSICAL EXAMINATION: ECOG PERFORMANCE STATUS: 0 - Asymptomatic  Vitals:   09/01/20 1059  BP: 126/88  Pulse: 96  Resp: 18  Temp: 98 F (36.7 C)  SpO2: 98%   Filed Weights   09/01/20 1059   Weight: 178 lb 12.8 oz (81.1 kg)    GENERAL:alert, no distress and comfortable SKIN: no rashes or significant lesions to exposed skin EYES: sclera clear LUNGS:  normal breathing effort NEURO: alert & oriented x 3 with fluent speech Breast: deferred  Limited exam for virtual visit   LABORATORY DATA:  I have reviewed the data as listed CBC Latest Ref Rng & Units 09/01/2020 04/22/2020 03/06/2020  WBC 4.0 - 10.5 K/uL 4.7 5.1 4.4  Hemoglobin 12.0 - 15.0  g/dL 13.8 13.3 13.6  Hematocrit 36.0 - 46.0 % 42.2 40.9 42.2  Platelets 150 - 400 K/uL 268 318 279     CMP Latest Ref Rng & Units 09/01/2020 04/22/2020 03/06/2020  Glucose 70 - 99 mg/dL 112(H) 111(H) 115(H)  BUN 8 - 23 mg/dL _0 Creatinine 0.44 - 1.00 mg/dL 0.73 0.64 0.79  Sodium 135 - 145 mmol/L 146(H) 142 143  Potassium 3.5 - 5.1 mmol/L 3.9 4.2 3.8  Chloride 98 - 111 mmol/L 109 106 109  CO2 22 - 32 mmol/L _1 Calcium 8.9 - 10.3 mg/dL 9.9 9.7 9.6  Total Protein 6.5 - 8.1 g/dL 7.2 6.3 7.2  Total Bilirubin 0.3 - 1.2 mg/dL 0.4 0.3 0.3  Alkaline Phos 38 - 126 U/L 37(L) - 37(L)  AST 15 - 41 U/L _2 ALT 0 - 44 U/L 13 35(H) 12      RADIOGRAPHIC STUDIES: I have personally reviewed the radiological images as listed and agreed with the findings in the report. No results found.   ASSESSMENT & PLAN: Cheryl Knight is a 65 y.o. female with    1. Malignant neoplasm of lower-outer quadrant of right breast, Stage IA, p(T1bN0M0), ER/PR+, HER2-, Grade I -Diagnosed in 02/2019, s/p right lumpectomy with SLNB on 03/28/19 by Dr Cheryl Knight. Her path showed 0.9 cm mass complete resected with clear margins, node negative. Given her small tumor and low Grade, this is likely low risk disease and adjuvant chemotherapy was not recommended. She completed adjuvant radiation.  -She started antiestrogen therapy with Tamoxifen 68m once daily in 06/2019. tolerating well -mammogram 02/2020 negative -continue surveillance and tamoxifen (refilled)    2. Genetic testing -She had mother with late age breast cancer and father with prostate cancer in his 526s  -[facility] panel 04/26/19, negative    3. Comorbidities: Osteoarthritis, Rheumatoid arthritis, Migraines, high cholesterol, hypothyroidism, HTN, GERD -On Arava 276mfor RA -continue per PCP and medical team -losing weight intentionally (09/01/20), down 10 lbs in 6 months    4. Osteopenia  -Her 02/22/19 DEXA showed osteopenia with lowest T-score is -1.4 at left femur, no high risk for fracture yet.  -tamoxifen can strengthen bone, continue calcium and Vit D supplements.  -Repeat DEXA 02/2021 with mammo  Disposition:  Ms. CaBerkos clinically doing well. Tolerating tamoxifen without significant side effects. Today's labs are unremarkable. 03/10/20 mammogram is negative, she also performs self exams diligently. Overall no clinical concern for recurrence.   She is 1.5 years from initial diagnosis, we reviewed s/sx of recurrence. Continue surveillance and tamoxifen. Will ask PCP to do breast exam at wellness visit this Fall. She will return for lab and f/up in 6 months, or sooner if needed.    I discussed the assessment and treatment plan with the patient who was provided an opportunity to ask questions and all were answered. She agreed with the plan and demonstrated an understanding of the instructions. Patient was advised to call back or seek an in-person evaluation if the symptoms worsen or if the condition fails to improve as anticipated. Total encounter time was 15 minutes.      LaAlla FeelingNP 09/02/20

## 2020-09-23 ENCOUNTER — Other Ambulatory Visit: Payer: Self-pay | Admitting: Physician Assistant

## 2020-09-23 DIAGNOSIS — M0579 Rheumatoid arthritis with rheumatoid factor of multiple sites without organ or systems involvement: Secondary | ICD-10-CM

## 2020-09-23 NOTE — Telephone Encounter (Signed)
Next Visit: 10/14/2020  Last Visit: 05/13/2020  Last Fill: 06/23/2020  DX: Rheumatoid arthritis involving multiple sites with positive rheumatoid factor   Current Dose per office note 05/13/2020: Arava 20 mg p.o. daily  Labs: 09/01/2020 Sodium 146, Glucose 112, Alk. Phos 37, Neutro Abs. 1.6  Okay to refill Arava?

## 2020-09-29 DIAGNOSIS — H40011 Open angle with borderline findings, low risk, right eye: Secondary | ICD-10-CM | POA: Diagnosis not present

## 2020-09-29 DIAGNOSIS — H04123 Dry eye syndrome of bilateral lacrimal glands: Secondary | ICD-10-CM | POA: Diagnosis not present

## 2020-09-29 DIAGNOSIS — H11041 Peripheral pterygium, stationary, right eye: Secondary | ICD-10-CM | POA: Diagnosis not present

## 2020-09-29 DIAGNOSIS — Z961 Presence of intraocular lens: Secondary | ICD-10-CM | POA: Diagnosis not present

## 2020-09-30 NOTE — Progress Notes (Signed)
Office Visit Note  Patient: Cheryl Knight             Date of Birth: December 11, 1955           MRN: [id number]             PCP: McLean-Scocuzza, Nino Glow, MD Referring: Orland Mustard * Visit Date: 10/14/2020 Occupation: '@GUAROCC'$ @  Subjective:  Sicca symptoms   History of Present Illness: Cheryl Knight is a 65 y.o. female with history of seropositive rheumatoid arthritis and ILD.  She is currently taking arava 20 mg 1 tablet by mouth daily.  She continues to tolerate Arava without any side effects and has not missed any doses recently.  She denies any recent rheumatoid arthritis flares.  She states she is having some increased aching and stiffness in her hands, knees, and ankles today which she attributes to weather changes.  She denies any joint swelling.  She remains active though hunting on a regular basis without difficulty.  She denies any increased shortness of breath and has occasional coughing which she attributes to GERD.  She continues to have chronic sicca symptoms.  She has found pilocarpine to be effective at managing her symptoms in the past but is not currently taking it due to cost. She denies any recent infections.  She is planning on receiving the annual influenza vaccination.     Activities of Daily Living:  Patient reports morning stiffness for several hours.   Patient Denies nocturnal pain.  Difficulty dressing/grooming: Denies Difficulty climbing stairs: Reports Difficulty getting out of chair: Denies Difficulty using hands for taps, buttons, cutlery, and/or writing: Reports  Review of Systems  Constitutional:  Negative for fatigue.  HENT:  Positive for mouth dryness and nose dryness. Negative for mouth sores.   Eyes:  Positive for pain and dryness.  Respiratory:  Negative for shortness of breath and difficulty breathing.   Cardiovascular:  Negative for chest pain and palpitations.  Gastrointestinal:  Positive for constipation and diarrhea.  Negative for blood in stool.  Endocrine: Negative for increased urination.  Genitourinary:  Negative for difficulty urinating.  Musculoskeletal:  Positive for morning stiffness. Negative for joint pain, joint pain, joint swelling, myalgias, muscle tenderness and myalgias.  Skin:  Negative for color change, rash and redness.  Allergic/Immunologic: Negative for susceptible to infections.  Neurological:  Positive for numbness. Negative for dizziness, headaches, memory loss and weakness.  Hematological:  Negative for bruising/bleeding tendency.  Psychiatric/Behavioral:  Negative for confusion.    PMFS History:  Patient Active Problem List   Diagnosis Date Noted   Fatty liver 06/24/2020   Annual physical exam 06/24/2020   Elevated liver enzymes 06/24/2020   Hypophosphatemia 02/28/2020   Acute cholecystitis 11/01/2019   Pulmonary fibrosis ([place]) 10/01/2019   Pulmonary nodules 10/01/2019   Bronchiectasis without complication ([place]) [id number]   Aortic atherosclerosis ([place]) 10/01/2019   Hypertension associated with diabetes ([place]) 10/01/2019   Paresthesia of upper extremity 10/01/2019   Hiatal hernia 08/16/2019   Esophagitis 08/16/2019   Persistent cough 08/16/2019   Dry eye syndrome 08/05/2019   Glaucoma 08/05/2019   Hypertensive retinopathy 08/05/2019   Arteriosclerosis 08/05/2019   Pterygium 08/05/2019   Ocular rosacea 08/05/2019   Genetic testing 05/02/2019   Family history of breast cancer    Family history of prostate cancer    Family history of skin cancer    Family history of lung cancer    Malignant neoplasm of lower-outer quadrant of right breast of female, estrogen receptor positive ([place])  03/19/2019   Type 2 diabetes mellitus with hyperglycemia, without long-term current use of insulin ([place]) 02/08/2019   Allergic rhinitis 02/08/2019   Osteoarthritis    Primary osteoarthritis involving multiple joints 11/15/2017   Moderate asthma with acute exacerbation 05/03/2017   Routine  general medical examination at a health care facility 02/08/2017   Primary osteoarthritis of both feet 02/10/2016   Hx of migraine headaches 02/07/2016   History of hypothyroidism 02/07/2016   History of gastroesophageal reflux (GERD) 02/07/2016   Obesity (BMI 30-39.9) 12/30/2015   Basal cell carcinoma of chest wall 10/05/2011   Hypertension 09/01/2010   Hyperlipidemia 02/27/2008   Hypothyroidism 02/21/2007   Migraine 02/21/2007   VAGINITIS, ATROPHIC 02/21/2007   Psoriasis 02/21/2007   Sicca syndrome, unspecified 02/21/2007   GERD 06/29/2006   Rheumatoid arthritis ([place]) 06/29/2006    Past Medical History:  Diagnosis Date   Cancer ([place]) 02/2019   right breast IDC   Cataracts, bilateral    tbd surgery [id number]   Complication of anesthesia    Cough    Difficult intubation    TRACH AT 19   Dyspnea    W/ COUGHING   Family history of breast cancer    Family history of lung cancer    Family history of prostate cancer    Family history of skin cancer    GERD (gastroesophageal reflux disease)    Headache    High cholesterol    History of hiatal hernia    Hypertension    Hypothyroidism    Irritable bowel    Osteoarthritis    i.e right knee    PONV (postoperative nausea and vomiting)    Pre-diabetes    Rheumatoid arthritis ([place])    Thyroid disease     Family History  Problem Relation Age of Onset   Hypertension Mother    Thyroid disease Mother    Breast cancer Mother 41       breast dx age 40 died age 74    Prostate cancer Father 40       metastatic   Stroke Maternal Grandmother    Skin cancer Maternal Uncle        dx. in his 31s   Lung cancer Maternal Uncle        smoker, died age 25   Past Surgical History:  Procedure Laterality Date   ABDOMINAL HYSTERECTOMY     age 61 ovaries out   BREAST LUMPECTOMY WITH RADIOACTIVE SEED AND SENTINEL LYMPH NODE BIOPSY Right 03/28/2019   Procedure: RIGHT BREAST LUMPECTOMY WITH RADIOACTIVE SEED AND SENTINEL LYMPH NODE BIOPSY;   Surgeon: Donnie Mesa, MD;  Location: [facility];  Service: General;  Laterality: Right;  PEC BLOCK FOR POST OP PAIN   BREAST SURGERY     CATARACT EXTRACTION, BILATERAL     CHOLECYSTECTOMY     10/2019   ESOPHAGEAL DILATION     Dr. 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  Constitutional:      Appearance: She is well-developed.  HENT:     Head: Normocephalic and atraumatic.  Eyes:     Conjunctiva/sclera: Conjunctivae normal.  Pulmonary:     Effort: Pulmonary effort is normal.  Abdominal:     Palpations: Abdomen is soft.  Musculoskeletal:     Cervical back: Normal range of motion.  Skin:    General: Skin is warm and dry.     Capillary Refill: Capillary refill takes less than 2 seconds.  Neurological:     Mental Status: She is alert and oriented to person, place, and time.  Psychiatric:        Behavior: Behavior normal.     Musculoskeletal Exam: C-spine has good ROM.  Shoulder joints, elbow joints, wrist joints, MCPs, PIPs, and DIPs good ROM with no synovitis.  PIP and DIP thickening consistent with osteoarthritis of both hands.  Hip joints, knee joints, and ankle joints have good ROM with no discomfort.  No warmth or effusion of knee joints. Some tenderness on the lateral aspect of the right ankle.  No tenderness  over MTP joints.   CDAI Exam: CDAI Score: 0.6  Patient Global: 3 mm; Provider Global: 3 mm Swollen: 0 ; Tender: 0  Joint Exam 10/14/2020   No joint exam has been documented for this visit   There is currently no information documented on the homunculus. Go to the Rheumatology activity and complete the homunculus joint exam.  Investigation: No additional findings.  Imaging: No results found.  Recent Labs: Lab Results  Component Value Date   WBC 4.7 09/01/2020   HGB 13.8 09/01/2020   PLT 268 09/01/2020   NA 146 (H) 09/01/2020   K 3.9 09/01/2020   CL 109 09/01/2020   CO2 24 09/01/2020   GLUCOSE 112 (H) 09/01/2020   BUN 19 09/01/2020   CREATININE 0.73 09/01/2020   BILITOT 0.4 09/01/2020   ALKPHOS 37 (L) 09/01/2020   AST 18 09/01/2020   ALT 13 09/01/2020   PROT 7.2 09/01/2020   ALBUMIN 4.0 09/01/2020   CALCIUM 9.9 09/01/2020   GFRAA 108 04/22/2020   QFTBGOLDPLUS NEGATIVE 04/05/2019    Speciality Comments: No specialty comments available.  Procedures:  No procedures performed Allergies: Pentazocine lactate, Demerol [meperidine], and Lisinopril   Assessment / Plan:     Visit Diagnoses: Rheumatoid arthritis involving multiple sites with positive rheumatoid factor ([place]) -  +RF: She has no synovitis on examination today.  She has not had any signs or symptoms of a rheumatoid arthritis flare recently. She is currently experiencing some aching and stiffness in both hands which she attributes to cooler weather temperatures.  She has PIP and DIP thickening consistent with osteoarthritis of both hands, but no inflammation was noted on examination today.  She is clinically doing well taking arava 20 mg 1 tablet by mouth daily.  She will remain on the current treatment regimen.  She was advised to notify us if she develops increased joint pain or joint swelling.  She will follow up in 5 months.   High risk medication use - Arava 20 mg 1 tablet by mouth daily.  (Previously on  Methotrexate 0.8 ml every 7 days and folic acid 1 mg 2 tablets daily.)  CBC and CMP updated on 09/01/20-results reviewed today in the office.  She is due to update lab work in November and every 3 months.  Standing orders for CBC and CMP remain in place.  She has not had any recent infections.  We discussed the importance of holding [place] if she develops signs or symptoms of an infection and to resume once the infection has completely cleared.  She voiced understanding.  She is planning on receiving the annual influenza vaccination as well as the Shingrix vaccination in the future.  ILD (interstitial lung disease) ([employer]) - Mild pulmonary fibrosis in an early "indeterminate for UIP" pattern.  Diagnosed on the basis of high-resolution CT. On 09/26/19. Followed by Dr. Patsey Berthold. Reviewed office visit note from 04/08/20.  Pt declined proceeding with bronchoscopy at that time since she was not experiencing any respiratory symptoms. She plans to follow up with Dr. Patsey Berthold as needed.  A follow-up ILD protocol CT was recommended 1 year after her previous scan to assess for stability of fibrotic changes and pattern.  Discussed these recommendations with the patient.  She is not experiencing any new or worsening symptoms.   Sicca syndrome ([place]): She continues to have ongoing sicca symptoms especially dry mouth.  She previously noticed improvement in her symptoms while taking pilocarpine 5 mg 3 times daily as needed for symptomatic relief.  She has not been taking pilocarpine recently due to cost.  A refill of pilocarpine and a goodrx coupon were provided to the patient today.  Discussed the importance of proper oral health.  Primary osteoarthritis of both knees: She has good ROM of both knee joints with no discomfort.  No warmth or effusion noted.   Primary osteoarthritis of both feet: She experiences intermittent pain and stiffness in both feet.  She has occasional numbness in her toes but has no diminished sensation at  this time.  She has good range of motion of both ankle joints with some tenderness on the lateral aspect of the right ankle.  No tenderness over MTP joints.  Discussed the importance of wearing proper fitting shoes.  Psoriasis: She has no active psoriasis at this time.   Other medical conditions are listed as follows:   History of hyperlipidemia  History of hypothyroidism  History of migraine  History of asthma - Diagnosed with asthma by Dr. Patsey Berthold.  Malignant neoplasm of lower-outer quadrant of right breast of female, estrogen receptor positive ([place])  History of gastroesophageal reflux (GERD)  Orders: No orders of the defined types were placed in this encounter.  Meds ordered this encounter  Medications   pilocarpine (SALAGEN) 5 MG tablet    Sig: TAKE 1 TABLET(5 MG) BY MOUTH THREE TIMES DAILY    Dispense:  90 tablet    Refill:  2      Follow-Up Instructions: Return in about 5 months (around 03/16/2021) for Rheumatoid arthritis, ILD.   Ofilia Neas, PA-C  Note - This record has been created using Dragon software.  Chart creation errors have been sought, but may not always  have been located. Such creation errors do not reflect on  the standard of medical care.

## 2020-10-10 ENCOUNTER — Ambulatory Visit (INDEPENDENT_AMBULATORY_CARE_PROVIDER_SITE_OTHER): Payer: Medicare HMO

## 2020-10-10 VITALS — Ht 68.0 in | Wt 178.0 lb

## 2020-10-10 DIAGNOSIS — Z0000 Encounter for general adult medical examination without abnormal findings: Secondary | ICD-10-CM | POA: Diagnosis not present

## 2020-10-10 NOTE — Patient Instructions (Addendum)
Cheryl Knight , Thank you for taking time to come for your Medicare Wellness Visit. I appreciate your ongoing commitment to your health goals. Please review the following plan we discussed and let me know if I can assist you in the future.   These are the goals we discussed:  Goals      Follow up with Primary Care Provider     As needed        This is a list of the screening recommended for you and due dates:  Health Maintenance  Topic Date Due   Hemoglobin A1C  08/27/2020   Complete foot exam   12/18/2020*   Zoster (Shingles) Vaccine (1 of 2) 01/09/2021*   Flu Shot  04/17/2021*   COVID-19 Vaccine (4 - Booster for Pfizer series) 06/18/2021*   Eye exam for diabetics  10/23/2020   Mammogram  03/10/2022   Tetanus Vaccine  12/16/2024   Colon Cancer Screening  08/13/2029   DEXA scan (bone density measurement)  Completed   Hepatitis C Screening: USPSTF Recommendation to screen - Ages 45-79 yo.  Completed   HPV Vaccine  Aged Out  *Topic was postponed. The date shown is not the original due date.    Advanced directives: not yet on file  Conditions/risks identified: none new  Follow up in one year for your annual wellness visit    Preventive Care 65 Years and Older, Female Preventive care refers to lifestyle choices and visits with your health care provider that can promote health and wellness. What does preventive care include? A yearly physical exam. This is also called an annual well check. Dental exams once or twice a year. Routine eye exams. Ask your health care provider how often you should have your eyes checked. Personal lifestyle choices, including: Daily care of your teeth and gums. Regular physical activity. Eating a healthy diet. Avoiding tobacco and drug use. Limiting alcohol use. Practicing safe sex. Taking low-dose aspirin every day. Taking vitamin and mineral supplements as recommended by your health care provider. What happens during an annual well  check? The services and screenings done by your health care provider during your annual well check will depend on your age, overall health, lifestyle risk factors, and family history of disease. Counseling  Your health care provider may ask you questions about your: Alcohol use. Tobacco use. Drug use. Emotional well-being. Home and relationship well-being. Sexual activity. Eating habits. History of falls. Memory and ability to understand (cognition). Work and work Statistician. Reproductive health. Screening  You may have the following tests or measurements: Height, weight, and BMI. Blood pressure. Lipid and cholesterol levels. These may be checked every 5 years, or more frequently if you are over 2 years old. Skin check. Lung cancer screening. You may have this screening every year starting at age 60 if you have a 30-pack-year history of smoking and currently smoke or have quit within the past 15 years. Fecal occult blood test (FOBT) of the stool. You may have this test every year starting at age 25. Flexible sigmoidoscopy or colonoscopy. You may have a sigmoidoscopy every 5 years or a colonoscopy every 10 years starting at age 4. Hepatitis C blood test. Hepatitis B blood test. Sexually transmitted disease (STD) testing. Diabetes screening. This is done by checking your blood sugar (glucose) after you have not eaten for a while (fasting). You may have this done every 1-3 years. Bone density scan. This is done to screen for osteoporosis. You may have this done starting at age  65. Mammogram. This may be done every 1-2 years. Talk to your health care provider about how often you should have regular mammograms. Talk with your health care provider about your test results, treatment options, and if necessary, the need for more tests. Vaccines  Your health care provider may recommend certain vaccines, such as: Influenza vaccine. This is recommended every year. Tetanus, diphtheria, and  acellular pertussis (Tdap, Td) vaccine. You may need a Td booster every 10 years. Zoster vaccine. You may need this after age 47. Pneumococcal 13-valent conjugate (PCV13) vaccine. One dose is recommended after age 89. Pneumococcal polysaccharide (PPSV23) vaccine. One dose is recommended after age 86. Talk to your health care provider about which screenings and vaccines you need and how often you need them. This information is not intended to replace advice given to you by your health care provider. Make sure you discuss any questions you have with your health care provider. Document Released: 01/31/2015 Document Revised: 09/24/2015 Document Reviewed: 11/05/2014 Elsevier Interactive Patient Education  2017 [place] Prevention in the Home Falls can cause injuries. They can happen to people of all ages. There are many things you can do to make your home safe and to help prevent falls. What can I do on the outside of my home? Regularly fix the edges of walkways and driveways and fix any cracks. Remove anything that might make you trip as you walk through a door, such as a raised step or threshold. Trim any bushes or trees on the path to your home. Use bright outdoor lighting. Clear any walking paths of anything that might make someone trip, such as rocks or tools. Regularly check to see if handrails are loose or broken. Make sure that both sides of any steps have handrails. Any raised decks and porches should have guardrails on the edges. Have any leaves, snow, or ice cleared regularly. Use sand or salt on walking paths during winter. Clean up any spills in your garage right away. This includes oil or grease spills. What can I do in the bathroom? Use night lights. Install grab bars by the toilet and in the tub and shower. Do not use towel bars as grab bars. Use non-skid mats or decals in the tub or shower. If you need to sit down in the shower, use a plastic, non-slip stool. Keep  the floor dry. Clean up any water that spills on the floor as soon as it happens. Remove soap buildup in the tub or shower regularly. Attach bath mats securely with double-sided non-slip rug tape. Do not have throw rugs and other things on the floor that can make you trip. What can I do in the bedroom? Use night lights. Make sure that you have a light by your bed that is easy to reach. Do not use any sheets or blankets that are too big for your bed. They should not hang down onto the floor. Have a firm chair that has side arms. You can use this for support while you get dressed. Do not have throw rugs and other things on the floor that can make you trip. What can I do in the kitchen? Clean up any spills right away. Avoid walking on wet floors. Keep items that you use a lot in easy-to-reach places. If you need to reach something above you, use a strong step stool that has a grab bar. Keep electrical cords out of the way. Do not use floor polish or wax that makes floors slippery.  If you must use wax, use non-skid floor wax. Do not have throw rugs and other things on the floor that can make you trip. What can I do with my stairs? Do not leave any items on the stairs. Make sure that there are handrails on both sides of the stairs and use them. Fix handrails that are broken or loose. Make sure that handrails are as long as the stairways. Check any carpeting to make sure that it is firmly attached to the stairs. Fix any carpet that is loose or worn. Avoid having throw rugs at the top or bottom of the stairs. If you do have throw rugs, attach them to the floor with carpet tape. Make sure that you have a light switch at the top of the stairs and the bottom of the stairs. If you do not have them, ask someone to add them for you. What else can I do to help prevent falls? Wear shoes that: Do not have high heels. Have rubber bottoms. Are comfortable and fit you well. Are closed at the toe. Do not  wear sandals. If you use a stepladder: Make sure that it is fully opened. Do not climb a closed stepladder. Make sure that both sides of the stepladder are locked into place. Ask someone to hold it for you, if possible. Clearly mark and make sure that you can see: Any grab bars or handrails. First and last steps. Where the edge of each step is. Use tools that help you move around (mobility aids) if they are needed. These include: Canes. Walkers. Scooters. Crutches. Turn on the lights when you go into a dark area. Replace any light bulbs as soon as they burn out. Set up your furniture so you have a clear path. Avoid moving your furniture around. If any of your floors are uneven, fix them. If there are any pets around you, be aware of where they are. Review your medicines with your doctor. Some medicines can make you feel dizzy. This can increase your chance of falling. Ask your doctor what other things that you can do to help prevent falls. This information is not intended to replace advice given to you by your health care provider. Make sure you discuss any questions you have with your health care provider. Document Released: 10/31/2008 Document Revised: 06/12/2015 Document Reviewed: 02/08/2014 Elsevier Interactive Patient Education  2017 [employer].

## 2020-10-10 NOTE — Progress Notes (Signed)
Subjective:   Cheryl Knight is a 65 y.o. female who presents for Medicare Annual (Subsequent) preventive examination.  Review of Systems    No ROS.  Medicare Wellness Virtual Visit.  Visual/audio telehealth visit, UTA vital signs.   See social history for additional risk factors.   Cardiac Risk Factors include: advanced age (>18men, >62 women);hypertension;diabetes mellitus     Objective:    Today's Vitals   10/10/20 1448  Weight: 178 lb (80.7 kg)  Height: 5\' 8"  (1.727 m)   Body mass index is 27.06 kg/m.  Advanced Directives 10/10/2020 11/02/2019 11/01/2019 10/29/2019 10/11/2019 10/10/2019 03/28/2019  Does Patient Have a Medical Advance Directive? No No No No No No No  Would patient like information on creating a medical advance directive? No - Patient declined No - Patient declined No - Patient declined No - Patient declined No - Patient declined Yes (MAU/Ambulatory/Procedural Areas - Information given) No - Patient declined    Current Medications (verified) Outpatient Encounter Medications as of 10/10/2020  Medication Sig   pilocarpine (SALAGEN) 5 MG tablet TAKE 1 TABLET(5 MG) BY MOUTH THREE TIMES DAILY   amLODipine (NORVASC) 2.5 MG tablet Take 1 tablet (2.5 mg total) by mouth daily.   Calcium Carbonate Antacid (TUMS CHEWY BITES PO) Take 1 tablet by mouth at bedtime as needed (acid reflux).   cyclobenzaprine (FLEXERIL) 10 MG tablet Take 1 tablet (10 mg total) by mouth daily. prn   Esomeprazole Magnesium (NEXIUM PO) Take by mouth in the morning.   famotidine-calcium carbonate-magnesium hydroxide (PEPCID COMPLETE) 10-800-165 MG chewable tablet Chew 1 tablet by mouth at bedtime.   glucosamine-chondroitin 500-400 MG tablet Take 2 tablets by mouth daily.    leflunomide (ARAVA) 20 MG tablet TAKE 1 TABLET(20 MG) BY MOUTH DAILY   levothyroxine (SYNTHROID) 100 MCG tablet TAKE 1 TABLET BY MOUTH 30 MINUTES WITH FOOD   loratadine (CLARITIN) 10 MG tablet Take 10 mg by mouth every other  day.    loratadine-pseudoephedrine (CLARITIN-D 24-HOUR) 10-240 MG 24 hr tablet Take 1 tablet by mouth every other day.   metFORMIN (GLUCOPHAGE XR) 500 MG 24 hr tablet Take 1 tablet (500 mg total) by mouth daily with breakfast.   Multiple Vitamin (MULTIVITAMIN WITH MINERALS) TABS tablet Take 1 tablet by mouth daily.   olmesartan-hydrochlorothiazide (BENICAR HCT) 20-12.5 MG tablet Take 1 tablet by mouth daily. In am   pravastatin (PRAVACHOL) 40 MG tablet Take 1 tablet (40 mg total) by mouth at bedtime.   promethazine (PHENERGAN) 25 MG tablet Take 1 tablet (25 mg total) by mouth 2 (two) times daily as needed for nausea or vomiting.   RESTASIS 0.05 % ophthalmic emulsion Place 1 drop into both eyes 2 (two) times daily.    SUMAtriptan (IMITREX) 50 MG tablet Take 1 tablet (50 mg total) by mouth every 2 (two) hours as needed for migraine.   tamoxifen (NOLVADEX) 20 MG tablet TAKE 1 TABLET(20 MG) BY MOUTH DAILY   topiramate (TOPAMAX) 100 MG tablet TAKE 1 TABLET(100 MG) BY MOUTH DAILY   No facility-administered encounter medications on file as of 10/10/2020.    Allergies (verified) Pentazocine lactate, Demerol [meperidine], and Lisinopril   History: Past Medical History:  Diagnosis Date   Cancer ([place]) 02/2019   right breast IDC   Cataracts, bilateral    tbd surgery 7408   Complication of anesthesia    Cough    Difficult intubation    TRACH AT 19   Dyspnea    W/ COUGHING   Family history  of breast cancer    Family history of lung cancer    Family history of prostate cancer    Family history of skin cancer    GERD (gastroesophageal reflux disease)    Headache    High cholesterol    History of hiatal hernia    Hypertension    Hypothyroidism    Irritable bowel    Osteoarthritis    i.e right knee    PONV (postoperative nausea and vomiting)    Pre-diabetes    Rheumatoid arthritis ([place])    Thyroid disease    Past Surgical History:  Procedure Laterality Date   ABDOMINAL HYSTERECTOMY      age 42 ovaries out   BREAST LUMPECTOMY WITH RADIOACTIVE SEED AND SENTINEL LYMPH NODE BIOPSY Right 03/28/2019   Procedure: RIGHT BREAST LUMPECTOMY WITH RADIOACTIVE SEED AND SENTINEL LYMPH NODE BIOPSY;  Surgeon: Donnie Mesa, MD;  Location: [facility];  Service: General;  Laterality: Right;  PEC BLOCK FOR POST OP PAIN   BREAST SURGERY     CATARACT EXTRACTION, BILATERAL     CHOLECYSTECTOMY     10/2019   ESOPHAGEAL DILATION     Dr. Olevia Perches 15 years from 2021   TONSILLECTOMY     TUBAL LIGATION     Family History  Problem Relation Age of Onset   Hypertension Mother    Thyroid disease Mother    Breast cancer Mother 70       breast dx age 81 died age 3    Prostate cancer Father 18       metastatic   Stroke Maternal Grandmother    Skin cancer Maternal Uncle        dx. in his 31s   Lung cancer Maternal Uncle        smoker, died age 43   Social History   Socioeconomic History   Marital status: Married    Spouse name: Not on file   Number of children: 2   Years of education: Not on file   Highest education level: Not on file  Occupational History   Occupation: nurse  Tobacco Use   Smoking status: Former    Packs/day: 0.25    Years: 7.00    Pack years: 1.75    Types: Cigarettes    Quit date: 2000    Years since quitting: 22.7   Smokeless tobacco: Never  Vaping Use   Vaping Use: Never used  Substance and Sexual Activity   Alcohol use: Yes    Comment: occ   Drug use: Never   Sexual activity: Yes  Other Topics Concern   Not on file  Social History Narrative   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   Has a beach house    Social Determinants of Health   Financial Resource Strain: Low Risk    Difficulty of Paying Living Expenses: Not hard at all  Food Insecurity: No Food Insecurity   Worried About Charity fundraiser in the Last Year: Never true   [place] in the Last Year: Never true  Transportation Needs: No Transportation Needs   Lack of Transportation  (Medical): No   Lack of Transportation (Non-Medical): No  Physical Activity: Not on file  Stress: No Stress Concern Present   Feeling of Stress : Not at all  Social Connections: Unknown   Frequency of Communication with Friends and Family: Not on file   Frequency of Social Gatherings with Friends and Family: Not on file   Attends Religious  Services: Not on file   Active Member of Clubs or Organizations: Not on file   Attends Archivist Meetings: Not on file   Marital Status: Married    Tobacco Counseling Counseling given: Not Answered   Clinical Intake:  Pre-visit preparation completed: Yes        Diabetes: No  How often do you need to have someone help you when you read instructions, pamphlets, or other written materials from your doctor or pharmacy?: 1 - Never  Diabetes Management: Does the patient want to be seen by Chronic Care Management for management of their diabetes?  No Would the patient like to be referred to a Nutritionist or for Diabetic Management?  No   Interpreter Needed?: No      Activities of Daily Living In your present state of health, do you have any difficulty performing the following activities: 10/10/2020 11/02/2019  Hearing? N -  Vision? N -  Difficulty concentrating or making decisions? N -  Walking or climbing stairs? N -  Dressing or bathing? N -  Doing errands, shopping? N N  Preparing Food and eating ? N -  Using the Toilet? N -  In the past six months, have you accidently leaked urine? N -  Do you have problems with loss of bowel control? N -  Managing your Medications? N -  Managing your Finances? N -  Housekeeping or managing your Housekeeping? N -  Some recent data might be hidden    Patient Care Team: McLean-Scocuzza, Nino Glow, MD as PCP - General (Internal Medicine) Kyung Rudd, MD as Consulting Physician (Radiation Oncology) Truitt Merle, MD as Consulting Physician (Hematology) Donnie Mesa, MD as Consulting  Physician (General Surgery) Mauro Kaufmann, RN as Oncology Nurse Navigator Rockwell Germany, RN as Oncology Nurse Navigator Alla Feeling, NP as Nurse Practitioner (Nurse Practitioner)  Indicate any recent Medical Services you may have received from other than [facility] providers in the past year (date may be approximate).     Assessment:   This is a routine wellness examination for Cheryl Knight.  I connected with [place] today by telephone and verified that I am speaking with the correct person using two identifiers. Location patient: home Location provider: work Persons participating in the virtual visit: patient, Marine scientist.    I discussed the limitations, risks, security and privacy concerns of performing an evaluation and management service by telephone and the availability of in person appointments. The patient expressed understanding and verbally consented to this telephonic visit.    Interactive audio and video telecommunications were attempted between this provider and patient, however failed, due to patient having technical difficulties OR patient did not have access to video capability.  We continued and completed visit with audio only.  Some vital signs may be absent or patient reported.   Hearing/Vision screen Hearing Screening - Comments:: Patient is able to hear conversational tones without difficulty.  No issues reported. Vision Screening - Comments:: Cataract extraction, bilateral They have seen their ophthalmologist in the last 12 months.   Dietary issues and exercise activities discussed:   Healthy diet Good water intake   Goals Addressed             This Visit's Progress    Follow up with Primary Care Provider       As needed       Depression Screen [facility] 2/9 Scores 10/10/2020 10/10/2019 08/28/2019 08/16/2019 04/27/2019 02/08/2019 08/23/2018  PHQ - 2 Score 0 0 0 0 0 0  0  PHQ- 9 Score - - - - - - 0    Fall Risk Fall Risk  10/10/2020 06/24/2020 10/10/2019 09/28/2019 08/28/2019   Falls in the past year? 0 0 0 0 0  Number falls in past yr: - 0 0 0 0  Injury with Fall? - 0 - 0 0  Risk for fall due to : - - - History of fall(s) -  Follow up Falls evaluation completed Falls evaluation completed Falls evaluation completed Falls evaluation completed Falls evaluation completed    [place]: Adequate lighting in your home to reduce risk of falls? Yes   ASSISTIVE DEVICES UTILIZED TO PREVENT FALLS: Use of a cane, walker or w/c? No   TIMED UP AND GO: Was the test performed? No .   Cognitive Function:   Patient is alert and oriented 3x. MMSE/6CIT deferred. Normal by direct communication/observation.     Immunizations Immunization History  Administered Date(s) Administered   Fluad Quad(high Dose 65+) 03/11/2020   H1N1 02/27/2008   Influenza-Unspecified 11/18/2017   PFIZER(Purple Top)SARS-COV-2 Vaccination 03/02/2019, 03/29/2019, 01/01/2020   Pneumococcal Conjugate-13 10/03/2019   Td 01/19/2004   Tdap 12/17/2014   Shingrix vaccine- Due, Education has been provided regarding the importance of this vaccine. Advised may receive this vaccine at local pharmacy or Health Dept. Aware to provide a copy of the vaccination record if obtained from local pharmacy or Health Dept. Verbalized acceptance and understanding. Deferred.   Influenza vaccine- plans to receive later in the season.   Health Maintenance Health Maintenance  Topic Date Due   HEMOGLOBIN A1C  08/27/2020   FOOT EXAM  12/18/2020 (Originally 08/27/2020)   Zoster Vaccines- Shingrix (1 of 2) 01/09/2021 (Originally 09/07/1974)   INFLUENZA VACCINE  04/17/2021 (Originally 08/18/2020)   COVID-19 Vaccine (4 - Booster for [place] series) 06/18/2021 (Originally 03/25/2020)   OPHTHALMOLOGY EXAM  10/23/2020   MAMMOGRAM  03/10/2022   TETANUS/TDAP  12/16/2024   COLONOSCOPY (Pts 45-38yrs Insurance coverage will need to be confirmed)  08/13/2029   DEXA SCAN  Completed   Hepatitis C Screening   Completed   HPV VACCINES  Aged Out   Vision Screening: Recommended annual ophthalmology exams for early detection of glaucoma and other disorders of the eye.  Dental Screening: Recommended annual dental exams for proper oral hygiene.  Community Resource Referral / Chronic Care Management: CRR required this visit?  No   CCM required this visit?  No      Plan:   Keep all routine maintenance appointments.   I have personally reviewed and noted the following in the patient's chart:   Medical and social history Use of alcohol, tobacco or illicit drugs  Current medications and supplements including opioid prescriptions. Not taking opioids.  Functional ability and status Nutritional status Physical activity Advanced directives List of other physicians Hospitalizations, surgeries, and ER visits in previous 12 months Vitals Screenings to include cognitive, depression, and falls Referrals and appointments  In addition, I have reviewed and discussed with patient certain preventive protocols, quality metrics, and best practice recommendations. A written personalized care plan for preventive services as well as general preventive health recommendations were provided to patient via mychart.     Varney Biles, LPN   1/32/4401

## 2020-10-14 ENCOUNTER — Telehealth: Payer: Self-pay | Admitting: Physician Assistant

## 2020-10-14 ENCOUNTER — Ambulatory Visit: Payer: Medicare HMO | Admitting: Physician Assistant

## 2020-10-14 ENCOUNTER — Other Ambulatory Visit: Payer: Self-pay

## 2020-10-14 ENCOUNTER — Encounter: Payer: Self-pay | Admitting: Physician Assistant

## 2020-10-14 VITALS — BP 122/75 | HR 85 | Ht 68.0 in | Wt 182.0 lb

## 2020-10-14 DIAGNOSIS — Z8709 Personal history of other diseases of the respiratory system: Secondary | ICD-10-CM

## 2020-10-14 DIAGNOSIS — Z170 Estrogen receptor positive status [ER+]: Secondary | ICD-10-CM

## 2020-10-14 DIAGNOSIS — M0579 Rheumatoid arthritis with rheumatoid factor of multiple sites without organ or systems involvement: Secondary | ICD-10-CM

## 2020-10-14 DIAGNOSIS — Z79899 Other long term (current) drug therapy: Secondary | ICD-10-CM | POA: Diagnosis not present

## 2020-10-14 DIAGNOSIS — M170 Bilateral primary osteoarthritis of knee: Secondary | ICD-10-CM

## 2020-10-14 DIAGNOSIS — L409 Psoriasis, unspecified: Secondary | ICD-10-CM | POA: Diagnosis not present

## 2020-10-14 DIAGNOSIS — J849 Interstitial pulmonary disease, unspecified: Secondary | ICD-10-CM

## 2020-10-14 DIAGNOSIS — M3500 Sicca syndrome, unspecified: Secondary | ICD-10-CM

## 2020-10-14 DIAGNOSIS — Z8719 Personal history of other diseases of the digestive system: Secondary | ICD-10-CM

## 2020-10-14 DIAGNOSIS — M19072 Primary osteoarthritis, left ankle and foot: Secondary | ICD-10-CM

## 2020-10-14 DIAGNOSIS — Z8639 Personal history of other endocrine, nutritional and metabolic disease: Secondary | ICD-10-CM | POA: Diagnosis not present

## 2020-10-14 DIAGNOSIS — M19071 Primary osteoarthritis, right ankle and foot: Secondary | ICD-10-CM | POA: Diagnosis not present

## 2020-10-14 DIAGNOSIS — Z8669 Personal history of other diseases of the nervous system and sense organs: Secondary | ICD-10-CM

## 2020-10-14 DIAGNOSIS — C50511 Malignant neoplasm of lower-outer quadrant of right female breast: Secondary | ICD-10-CM

## 2020-10-14 MED ORDER — PILOCARPINE HCL 5 MG PO TABS: ORAL_TABLET | ORAL | 2 refills | Status: DC

## 2020-10-14 NOTE — Telephone Encounter (Signed)
I attempted to call the patient to encourage her to follow up with her pulmonologist to discuss ordering an updated chest CT.  She has a high resolution chest CT on 09/26/19 revealing findings consistent with pulmonary fibrosis.  It was recommended to have a repeat CT in 1 year to assess for any progression. Although she is not currently experiencing any new or worsening pulmonary symptom I would recommend repeating the CT as recommended.   Hazel Sams, PA-C

## 2020-10-14 NOTE — Telephone Encounter (Signed)
Patient advised Cheryl Knight would like to encourage her to follow up with her pulmonologist to discuss ordering an updated chest CT.  She has a high resolution chest CT on 09/26/19 revealing findings consistent with pulmonary fibrosis.  It was recommended to have a repeat CT in 1 year to assess for any progression. Although she is not currently experiencing any new or worsening pulmonary symptom I would recommend repeating the CT as recommended. Patient expressed understanding.

## 2020-10-14 NOTE — Patient Instructions (Addendum)
Standing Labs We placed an order today for your standing lab work.   Please have your standing labs drawn in November and every 3 months   CBC and CMP    If possible, please have your labs drawn 2 weeks prior to your appointment so that the provider can discuss your results at your appointment.  Please note that you may see your imaging and lab results in [place] before we have reviewed them. We may be awaiting multiple results to interpret others before contacting you. Please allow our office up to 72 hours to thoroughly review all of the results before contacting the office for clarification of your results.  We have open lab daily: Monday through Thursday from 1:30-4:30 PM and Friday from 1:30-4:00 PM at the office of Dr. Bo Merino, [facility].   Please be advised, all patients with office appointments requiring lab work will take precedent over walk-in lab work.  If possible, please come for your lab work on Monday and Friday afternoons, as you may experience shorter wait times. The office is located at [address] [place], [place], [place] [address] No appointment is necessary.   Labs are drawn by [facility]. Please bring your co-pay at the time of your lab draw.  You may receive a bill from [facility] for your lab work.  If you wish to have your labs drawn at another location, please call the office 24 hours in advance to send orders.  If you have any questions regarding directions or hours of operation,  please call [phone].   As a reminder, please drink plenty of water prior to coming for your lab work. Thanks!

## 2020-11-11 ENCOUNTER — Other Ambulatory Visit: Payer: Self-pay | Admitting: Internal Medicine

## 2020-11-11 DIAGNOSIS — E119 Type 2 diabetes mellitus without complications: Secondary | ICD-10-CM

## 2020-11-14 ENCOUNTER — Ambulatory Visit: Payer: Medicare HMO

## 2020-11-15 ENCOUNTER — Other Ambulatory Visit: Payer: Self-pay | Admitting: Internal Medicine

## 2020-11-15 DIAGNOSIS — I10 Essential (primary) hypertension: Secondary | ICD-10-CM

## 2020-11-18 ENCOUNTER — Ambulatory Visit: Payer: Medicare HMO

## 2020-11-24 ENCOUNTER — Telehealth: Payer: Self-pay | Admitting: Internal Medicine

## 2020-11-24 NOTE — Telephone Encounter (Signed)
Prior authorization has been submitted for patient's Cyclobenzaprine   Awaiting approval or denial.

## 2020-12-10 ENCOUNTER — Telehealth (INDEPENDENT_AMBULATORY_CARE_PROVIDER_SITE_OTHER): Payer: Medicare HMO | Admitting: Family Medicine

## 2020-12-10 ENCOUNTER — Other Ambulatory Visit: Payer: Self-pay

## 2020-12-10 ENCOUNTER — Encounter: Payer: Self-pay | Admitting: Family Medicine

## 2020-12-10 ENCOUNTER — Other Ambulatory Visit: Payer: Self-pay | Admitting: Family Medicine

## 2020-12-10 ENCOUNTER — Other Ambulatory Visit (INDEPENDENT_AMBULATORY_CARE_PROVIDER_SITE_OTHER): Payer: Medicare HMO

## 2020-12-10 VITALS — BP 130/78 | Ht 68.0 in | Wt 182.0 lb

## 2020-12-10 DIAGNOSIS — J111 Influenza due to unidentified influenza virus with other respiratory manifestations: Secondary | ICD-10-CM | POA: Insufficient documentation

## 2020-12-10 DIAGNOSIS — J029 Acute pharyngitis, unspecified: Secondary | ICD-10-CM

## 2020-12-10 LAB — POCT INFLUENZA A/B
Influenza A, POC: NEGATIVE
Influenza B, POC: NEGATIVE

## 2020-12-10 LAB — POCT RAPID STREP A (OFFICE): Rapid Strep A Screen: NEGATIVE

## 2020-12-10 MED ORDER — HYDROCOD POLST-CPM POLST ER 10-8 MG/5ML PO SUER
5.0000 mL | Freq: Two times a day (BID) | ORAL | 0 refills | Status: DC | PRN
Start: 2020-12-10 — End: 2020-12-25

## 2020-12-10 MED ORDER — OSELTAMIVIR PHOSPHATE 75 MG PO CAPS: 75.0000 mg | ORAL_CAPSULE | Freq: Two times a day (BID) | ORAL | 0 refills | Status: DC

## 2020-12-10 NOTE — Assessment & Plan Note (Signed)
The patient presumably has influenza given her recent exposure and symptoms.  She did have a negative rapid flu test though that could be a false negative.  She had a negative strep test.  COVID testing is pending.  She will remain at home.  I discussed I would call her with her COVID test if it is positive.  We will treat for influenza with Tamiflu.  Discussed the risk of neuropsychiatric issues with this medication and if those were to occur she would need to contact us right away.  Discussed staying adequately hydrated and resting.  Tussionex provided for cough.  She was advised to monitor for drowsiness with this and discontinue it if there was excessive drowsiness.  She will not drive while taking this medication.  She will seek medical attention in person if her symptoms worsen.

## 2020-12-10 NOTE — Progress Notes (Signed)
Patient taking dayquil is helping with symptoms. Mainly sore throat and cough still bothering the Patient.   Onset of symptoms yesterday. Her 2 grandsons and daughter in law tested positive for the flu. Grandsons are 38 and 11.

## 2020-12-10 NOTE — Progress Notes (Signed)
Virtual Visit via video Note  This visit type was conducted due to national recommendations for restrictions regarding the COVID-19 pandemic (e.g. social distancing).  This format is felt to be most appropriate for this patient at this time.  All issues noted in this document were discussed and addressed.  No physical exam was performed (except for noted visual exam findings with Video Visits).   I connected with Cheryl Knight today at  3:30 PM EST by a video enabled telemedicine application and verified that I am speaking with the correct person using two identifiers. Location patient: home Location provider: work Persons participating in the virtual visit: patient, provider  I discussed the limitations, risks, security and privacy concerns of performing an evaluation and management service by telephone and the availability of in person appointments. I also discussed with the patient that there may be a patient responsible charge related to this service. The patient expressed understanding and agreed to proceed.  Reason for visit: Same-day visit.  HPI: Sore throat: Patient notes onset of symptoms yesterday.  Started with sore throat.  Then developed some sinus congestion.  She has started to develop some cough.  Describes a throat pain as an 8 out of 10.  She does not have much of an appetite.  No fever.  No shortness of breath.  Not much taste.  No smell disturbance.  She is been using DayQuil.  No COVID exposures.  She does report influenza exposure and her grandchildren and daughter-in-law.  Patient also reports some watery diarrhea with some abdominal cramping though no blood in her stool or vomiting.  She reports she is urinating normally.   ROS: See pertinent positives and negatives per HPI.  Past Medical History:  Diagnosis Date   Cancer ([facility]) 02/2019   right breast IDC   Cataracts, bilateral    tbd surgery 4818   Complication of anesthesia    Cough    Difficult intubation     TRACH AT 19   Dyspnea    W/ COUGHING   Family history of breast cancer    Family history of lung cancer    Family history of prostate cancer    Family history of skin cancer    GERD (gastroesophageal reflux disease)    Headache    High cholesterol    History of hiatal hernia    Hypertension    Hypothyroidism    Irritable bowel    Osteoarthritis    i.e right knee    PONV (postoperative nausea and vomiting)    Pre-diabetes    Rheumatoid arthritis (HCC)    Thyroid disease     Past Surgical History:  Procedure Laterality Date   ABDOMINAL HYSTERECTOMY     age 14 ovaries out   BREAST LUMPECTOMY WITH RADIOACTIVE SEED AND SENTINEL LYMPH NODE BIOPSY Right 03/28/2019   Procedure: RIGHT BREAST LUMPECTOMY WITH RADIOACTIVE SEED AND SENTINEL LYMPH NODE BIOPSY;  Surgeon: Donnie Mesa, MD;  Location: [facility];  Service: General;  Laterality: Right;  PEC BLOCK FOR POST OP PAIN   BREAST SURGERY     CATARACT EXTRACTION, BILATERAL     CHOLECYSTECTOMY     10/2019   ESOPHAGEAL DILATION     Dr. Olevia Perches 15 years from 2021   TONSILLECTOMY     TUBAL LIGATION      Family History  Problem Relation Age of Onset   Hypertension Mother    Thyroid disease Mother    Breast cancer Mother 19  breast dx age 76 died age 47    Prostate cancer Father 56       metastatic   Stroke Maternal Grandmother    Skin cancer Maternal Uncle        dx. in his 55s   Lung cancer Maternal Uncle        smoker, died age 27    SOCIAL HX: Former smoker   Current Outpatient Medications:    amLODipine (NORVASC) 2.5 MG tablet, Take 1 tablet (2.5 mg total) by mouth daily., Disp: 90 tablet, Rfl: 3   Calcium Carbonate Antacid (TUMS CHEWY BITES PO), Take 1 tablet by mouth at bedtime as needed (acid reflux)., Disp: , Rfl:    chlorpheniramine-HYDROcodone (TUSSIONEX PENNKINETIC ER) 10-8 MG/5ML SUER, Take 5 mLs by mouth every 12 (twelve) hours as needed for cough., Disp: 70 mL, Rfl: 0   cyclobenzaprine  (FLEXERIL) 10 MG tablet, Take 1 tablet (10 mg total) by mouth daily. prn, Disp: 90 tablet, Rfl: 1   Esomeprazole Magnesium (NEXIUM PO), Take by mouth in the morning., Disp: , Rfl:    famotidine-calcium carbonate-magnesium hydroxide (PEPCID COMPLETE) 10-800-165 MG chewable tablet, Chew 1 tablet by mouth at bedtime., Disp: , Rfl:    glucosamine-chondroitin 500-400 MG tablet, Take 2 tablets by mouth daily. , Disp: , Rfl:    leflunomide (ARAVA) 20 MG tablet, TAKE 1 TABLET(20 MG) BY MOUTH DAILY, Disp: 30 tablet, Rfl: 2   levothyroxine (SYNTHROID) 100 MCG tablet, TAKE 1 TABLET BY MOUTH 30 MINUTES WITH FOOD, Disp: 90 tablet, Rfl: 3   metFORMIN (GLUCOPHAGE-XR) 500 MG 24 hr tablet, TAKE 1 TABLET(500 MG) BY MOUTH DAILY WITH BREAKFAST, Disp: 90 tablet, Rfl: 3   Multiple Vitamin (MULTIVITAMIN WITH MINERALS) TABS tablet, Take 1 tablet by mouth daily., Disp: , Rfl:    olmesartan-hydrochlorothiazide (BENICAR HCT) 20-12.5 MG tablet, TAKE 1 TABLET BY MOUTH DAILY IN THE MORNING, Disp: 90 tablet, Rfl: 3   oseltamivir (TAMIFLU) 75 MG capsule, Take 1 capsule (75 mg total) by mouth 2 (two) times daily., Disp: 10 capsule, Rfl: 0   pilocarpine (SALAGEN) 5 MG tablet, TAKE 1 TABLET(5 MG) BY MOUTH THREE TIMES DAILY, Disp: 90 tablet, Rfl: 2   pravastatin (PRAVACHOL) 40 MG tablet, Take 1 tablet (40 mg total) by mouth at bedtime., Disp: 90 tablet, Rfl: 3   promethazine (PHENERGAN) 25 MG tablet, Take 1 tablet (25 mg total) by mouth 2 (two) times daily as needed for nausea or vomiting., Disp: 60 tablet, Rfl: 5   RESTASIS 0.05 % ophthalmic emulsion, Place 1 drop into both eyes 2 (two) times daily. , Disp: , Rfl:    SUMAtriptan (IMITREX) 50 MG tablet, Take 1 tablet (50 mg total) by mouth every 2 (two) hours as needed for migraine., Disp: , Rfl:    tamoxifen (NOLVADEX) 20 MG tablet, TAKE 1 TABLET(20 MG) BY MOUTH DAILY, Disp: 90 tablet, Rfl: 3   topiramate (TOPAMAX) 100 MG tablet, TAKE 1 TABLET(100 MG) BY MOUTH DAILY, Disp: 90 tablet,  Rfl: 3   loratadine (CLARITIN) 10 MG tablet, Take 10 mg by mouth every other day.  (Patient not taking: Reported on 12/10/2020), Disp: , Rfl:    loratadine-pseudoephedrine (CLARITIN-D 24-HOUR) 10-240 MG 24 hr tablet, Take 1 tablet by mouth every other day. (Patient not taking: Reported on 12/10/2020), Disp: , Rfl:   EXAM:  VITALS per patient if applicable:  GENERAL: alert, oriented, appears well and in no acute distress  HEENT: atraumatic, conjunttiva clear, no obvious abnormalities on inspection of external nose and  ears  NECK: normal movements of the head and neck  LUNGS: on inspection no signs of respiratory distress, breathing rate appears normal, no obvious gross SOB, gasping or wheezing  CV: no obvious cyanosis  MS: moves all visible extremities without noticeable abnormality  PSYCH/NEURO: pleasant and cooperative, no obvious depression or anxiety, speech and thought processing grossly intact  ASSESSMENT AND PLAN:  Discussed the following assessment and plan:  Problem List Items Addressed This Visit     Influenza - Primary    The patient presumably has influenza given her recent exposure and symptoms.  She did have a negative rapid flu test though that could be a false negative.  She had a negative strep test.  COVID testing is pending.  She will remain at home.  I discussed I would call her with her COVID test if it is positive.  We will treat for influenza with Tamiflu.  Discussed the risk of neuropsychiatric issues with this medication and if those were to occur she would need to contact us right away.  Discussed staying adequately hydrated and resting.  Tussionex provided for cough.  She was advised to monitor for drowsiness with this and discontinue it if there was excessive drowsiness.  She will not drive while taking this medication.  She will seek medical attention in person if her symptoms worsen.      Relevant Medications   chlorpheniramine-HYDROcodone (TUSSIONEX  PENNKINETIC ER) 10-8 MG/5ML SUER   oseltamivir (TAMIFLU) 75 MG capsule    Return if symptoms worsen or fail to improve.   I discussed the assessment and treatment plan with the patient. The patient was provided an opportunity to ask questions and all were answered. The patient agreed with the plan and demonstrated an understanding of the instructions.   The patient was advised to call back or seek an in-person evaluation if the symptoms worsen or if the condition fails to improve as anticipated.   Tommi Rumps, MD

## 2020-12-11 LAB — SARS-COV-2, NAA 2 DAY TAT

## 2020-12-11 LAB — NOVEL CORONAVIRUS, NAA: SARS-CoV-2, NAA: NOT DETECTED

## 2020-12-19 ENCOUNTER — Other Ambulatory Visit: Payer: Self-pay

## 2020-12-19 ENCOUNTER — Telehealth: Payer: Self-pay | Admitting: Internal Medicine

## 2020-12-19 MED ORDER — ESOMEPRAZOLE MAGNESIUM 40 MG PO CPDR: 40.0000 mg | DELAYED_RELEASE_CAPSULE | Freq: Every day | ORAL | 1 refills | Status: DC

## 2020-12-19 NOTE — Telephone Encounter (Signed)
Pt is needing a refill for Esomeprazole Magnesium (NEXIUM PO) Pt uses [facility] in [place]  Pt also has appt scheduled for 12/8 at 11 with PCP.

## 2020-12-19 NOTE — Telephone Encounter (Signed)
I called patient & per Dr. Olivia Mackie okay to refill. Pt stated that she was taking esomeprazole 40mg  daily. I have refilled for patient.

## 2020-12-25 ENCOUNTER — Ambulatory Visit (INDEPENDENT_AMBULATORY_CARE_PROVIDER_SITE_OTHER): Payer: Medicare HMO | Admitting: Internal Medicine

## 2020-12-25 ENCOUNTER — Encounter: Payer: Self-pay | Admitting: Internal Medicine

## 2020-12-25 ENCOUNTER — Other Ambulatory Visit: Payer: Self-pay

## 2020-12-25 VITALS — BP 124/86 | HR 91 | Temp 97.2°F | Ht 68.0 in | Wt 182.0 lb

## 2020-12-25 DIAGNOSIS — K429 Umbilical hernia without obstruction or gangrene: Secondary | ICD-10-CM

## 2020-12-25 DIAGNOSIS — Z170 Estrogen receptor positive status [ER+]: Secondary | ICD-10-CM

## 2020-12-25 DIAGNOSIS — C50511 Malignant neoplasm of lower-outer quadrant of right female breast: Secondary | ICD-10-CM

## 2020-12-25 DIAGNOSIS — I10 Essential (primary) hypertension: Secondary | ICD-10-CM | POA: Diagnosis not present

## 2020-12-25 DIAGNOSIS — E1159 Type 2 diabetes mellitus with other circulatory complications: Secondary | ICD-10-CM | POA: Diagnosis not present

## 2020-12-25 DIAGNOSIS — I152 Hypertension secondary to endocrine disorders: Secondary | ICD-10-CM | POA: Diagnosis not present

## 2020-12-25 DIAGNOSIS — E039 Hypothyroidism, unspecified: Secondary | ICD-10-CM

## 2020-12-25 MED ORDER — LEVOTHYROXINE SODIUM 100 MCG PO TABS: ORAL_TABLET | ORAL | 3 refills | Status: DC

## 2020-12-25 MED ORDER — AMLODIPINE BESYLATE 2.5 MG PO TABS: 2.5000 mg | ORAL_TABLET | Freq: Every day | ORAL | 3 refills | Status: DC

## 2020-12-25 NOTE — Patient Instructions (Addendum)
High dose flu for above 65 in another 1 week  Pfizer bilavent next shot in 1 month  Then  Pneumonia 23 vaccine can get at [facility] mammogram and bone density make sure after 03/10/21    Pneumococcal Polysaccharide Vaccine (PPSV23): What You Need to Know 1. Why get vaccinated? Pneumococcal polysaccharide vaccine (PPSV23) can prevent pneumococcal disease. Pneumococcal disease refers to any illness caused by pneumococcal bacteria. These bacteria can cause many types of illnesses, including pneumonia, which is an infection of the lungs. Pneumococcal bacteria are one of the most common causes of pneumonia. Besides pneumonia, pneumococcal bacteria can also cause: Ear infections Sinus infections Meningitis (infection of the tissue covering the brain and spinal cord) Bacteremia (bloodstream infection) Anyone can get pneumococcal disease, but children under 23 years of age, people with certain medical conditions, adults 57 years or older, and cigarette smokers are at the highest risk. Most pneumococcal infections are mild. However, some can result in long-term problems, such as brain damage or hearing loss. Meningitis, bacteremia, and pneumonia caused by pneumococcal disease can be fatal. 2. PPSV23 PPSV23 protects against 23 types of bacteria that cause pneumococcal disease. PPSV23 is recommended for: All adults 68 years or older, Anyone 2 years or older with certain medical conditions that can lead to an increased risk for pneumococcal disease. Most people need only one dose of PPSV23. A second dose of PPSV23, and another type of pneumococcal vaccine called PCV13, are recommended for certain high-risk groups. Your health care provider can give you more information. People 65 years or older should get a dose of PPSV23 even if they have already gotten one or more doses of the vaccine before they turned 49. 3. Talk with your health care provider Tell your vaccine provider if the person  getting the vaccine: Has had an allergic reaction after a previous dose of PPSV23, or has any severe, life-threatening allergies. In some cases, your health care provider may decide to postpone PPSV23 vaccination to a future visit. People with minor illnesses, such as a cold, may be vaccinated. People who are moderately or severely ill should usually wait until they recover before getting PPSV23. Your health care provider can give you more information. 4. Risks of a vaccine reaction Redness or pain where the shot is given, feeling tired, fever, or muscle aches can happen after PPSV23. People sometimes faint after medical procedures, including vaccination. Tell your provider if you feel dizzy or have vision changes or ringing in the ears. As with any medicine, there is a very remote chance of a vaccine causing a severe allergic reaction, other serious injury, or death. 5. What if there is a serious problem? An allergic reaction could occur after the vaccinated person leaves the clinic. If you see signs of a severe allergic reaction (hives, swelling of the face and throat, difficulty breathing, a fast heartbeat, dizziness, or weakness), call 9-1-1 and get the person to the nearest hospital. For other signs that concern you, call your health care provider. Adverse reactions should be reported to the Vaccine Adverse Event Reporting System (VAERS). Your health care provider will usually file this report, or you can do it yourself. Visit the VAERS website at www.vaers.SamedayNews.es or call 413-466-2557. VAERS is only for reporting reactions, and VAERS staff do not give medical advice. 6. How can I learn more? Ask your health care provider. Call your local or state health department. Contact the Centers for Disease Control and Prevention (CDC): Call 938-204-4501 (1-800-CDC-INFO) or Visit  CDC's website at http://hunter.com/ Vaccine Information Statement PPSV23 Vaccine (11/16/2017) This information is not  intended to replace advice given to you by your health care provider. Make sure you discuss any questions you have with your health care provider. Document Revised: 09/06/2019 Document Reviewed: 09/07/2019 Elsevier Patient Education  [place].

## 2020-12-25 NOTE — Progress Notes (Addendum)
Chief Complaint  Patient presents with   Follow-up   F/u  1. Getting over illness had h/a ab cramps, diarrhea now resolved no sick contacts tested neg flu, covid and sx's resolved after 1 weeks feeling better this week  2. Right breast cancer will do breast exam today and ordered dx mammogram and dexa [facility] 3. Dm 2 check a1c, lipid on metformin xr 500  Eye exam [facility] 01/2021 BP controlled on norvasc 2.5 and benicar hct 20-12.5 4. Hypothyroidism on levo 100 mcg   Review of Systems  Constitutional:  Negative for weight loss.  HENT:  Negative for hearing loss.   Eyes:  Negative for blurred vision.  Respiratory:  Negative for shortness of breath.   Cardiovascular:  Negative for chest pain.  Gastrointestinal:  Negative for abdominal pain and blood in stool.  Genitourinary:  Negative for dysuria.  Musculoskeletal:  Negative for falls and joint pain.  Skin:  Negative for rash.  Neurological:  Negative for headaches.  Psychiatric/Behavioral:  Negative for depression.   Past Medical History:  Diagnosis Date   Cancer ([place]) 02/2019   right breast IDC   Cataracts, bilateral    tbd surgery 6195   Complication of anesthesia    Cough    Difficult intubation    TRACH AT 19   Dyspnea    W/ COUGHING   Family history of breast cancer    Family history of lung cancer    Family history of prostate cancer    Family history of skin cancer    GERD (gastroesophageal reflux disease)    Headache    High cholesterol    History of hiatal hernia    Hypertension    Hypothyroidism    Irritable bowel    Osteoarthritis    i.e right knee    PONV (postoperative nausea and vomiting)    Pre-diabetes    Rheumatoid arthritis (HCC)    Thyroid disease    Past Surgical History:  Procedure Laterality Date   ABDOMINAL HYSTERECTOMY     age 22 ovaries out   BREAST LUMPECTOMY WITH RADIOACTIVE SEED AND SENTINEL LYMPH NODE BIOPSY Right 03/28/2019   Procedure: RIGHT BREAST LUMPECTOMY WITH RADIOACTIVE SEED  AND SENTINEL LYMPH NODE BIOPSY;  Surgeon: Donnie Mesa, MD;  Location: [facility];  Service: General;  Laterality: Right;  PEC BLOCK FOR POST OP PAIN   BREAST SURGERY     CATARACT EXTRACTION, BILATERAL     CHOLECYSTECTOMY     10/2019   ESOPHAGEAL DILATION     Dr. Olevia Perches 15 years from [place]     Family History  Problem Relation Age of Onset   Hypertension Mother    Thyroid disease Mother    Breast cancer Mother 51       breast dx age 67 died age 39    Prostate cancer Father 91       metastatic   Stroke Maternal Grandmother    Skin cancer Maternal Uncle        dx. in his 38s   Lung cancer Maternal Uncle        smoker, died age 62   Social History   Socioeconomic History   Marital status: Married    Spouse name: Not on file   Number of children: 2   Years of education: Not on file   Highest education level: Not on file  Occupational History   Occupation: nurse  Tobacco Use  Smoking status: Former    Packs/day: 0.25    Years: 7.00    Pack years: 1.75    Types: Cigarettes    Quit date: 2000    Years since quitting: 22.9   Smokeless tobacco: Never  Vaping Use   Vaping Use: Never used  Substance and Sexual Activity   Alcohol use: Yes    Comment: occ   Drug use: Never   Sexual activity: Yes  Other Topics Concern   Not on file  Social History Narrative   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   Has a beach house    Social Determinants of Health   Financial Resource Strain: Low Risk    Difficulty of Paying Living Expenses: Not hard at all  Food Insecurity: No Food Insecurity   Worried About Charity fundraiser in the Last Year: Never true   [place] in the Last Year: Never true  Transportation Needs: No Transportation Needs   Lack of Transportation (Medical): No   Lack of Transportation (Non-Medical): No  Physical Activity: Not on file  Stress: No Stress Concern Present   Feeling of Stress : Not at all  Social  Connections: Unknown   Frequency of Communication with Friends and Family: Not on file   Frequency of Social Gatherings with Friends and Family: Not on file   Attends Religious Services: Not on file   Active Member of Clubs or Organizations: Not on file   Attends Archivist Meetings: Not on file   Marital Status: Married  Human resources officer Violence: Not At Risk   Fear of Current or Ex-Partner: No   Emotionally Abused: No   Physically Abused: No   Sexually Abused: No   Current Meds  Medication Sig   Calcium Carbonate Antacid (TUMS CHEWY BITES PO) Take 1 tablet by mouth at bedtime as needed (acid reflux).   cyclobenzaprine (FLEXERIL) 10 MG tablet Take 1 tablet (10 mg total) by mouth daily. prn   esomeprazole (NEXIUM) 40 MG capsule Take 1 capsule (40 mg total) by mouth daily.   famotidine-calcium carbonate-magnesium hydroxide (PEPCID COMPLETE) 10-800-165 MG chewable tablet Chew 1 tablet by mouth at bedtime.   glucosamine-chondroitin 500-400 MG tablet Take 2 tablets by mouth daily.    leflunomide (ARAVA) 20 MG tablet TAKE 1 TABLET(20 MG) BY MOUTH DAILY   metFORMIN (GLUCOPHAGE-XR) 500 MG 24 hr tablet TAKE 1 TABLET(500 MG) BY MOUTH DAILY WITH BREAKFAST   Multiple Vitamin (MULTIVITAMIN WITH MINERALS) TABS tablet Take 1 tablet by mouth daily.   olmesartan-hydrochlorothiazide (BENICAR HCT) 20-12.5 MG tablet TAKE 1 TABLET BY MOUTH DAILY IN THE MORNING   pilocarpine (SALAGEN) 5 MG tablet TAKE 1 TABLET(5 MG) BY MOUTH THREE TIMES DAILY   pravastatin (PRAVACHOL) 40 MG tablet Take 1 tablet (40 mg total) by mouth at bedtime.   promethazine (PHENERGAN) 25 MG tablet Take 1 tablet (25 mg total) by mouth 2 (two) times daily as needed for nausea or vomiting.   RESTASIS 0.05 % ophthalmic emulsion Place 1 drop into both eyes 2 (two) times daily.    SUMAtriptan (IMITREX) 50 MG tablet Take 1 tablet (50 mg total) by mouth every 2 (two) hours as needed for migraine.   tamoxifen (NOLVADEX) 20 MG tablet  TAKE 1 TABLET(20 MG) BY MOUTH DAILY   topiramate (TOPAMAX) 100 MG tablet TAKE 1 TABLET(100 MG) BY MOUTH DAILY   [DISCONTINUED] amLODipine (NORVASC) 2.5 MG tablet Take 1 tablet (2.5 mg total) by mouth daily.   [DISCONTINUED] levothyroxine (SYNTHROID) 100  MCG tablet TAKE 1 TABLET BY MOUTH 30 MINUTES WITH FOOD   Allergies  Allergen Reactions   Pentazocine Lactate Other (See Comments)    Hallucinations   Demerol [Meperidine] Nausea And Vomiting   Lisinopril Cough   Recent Results (from the past 2160 hour(s))  Novel Coronavirus, NAA ([facility])     Status: None   Collection Time: 12/10/20 10:26 AM   Specimen: Nasopharyngeal(NP) swabs in vial transport medium   Nasopharynge  Previous  Result Value Ref Range   SARS-CoV-2, NAA Not Detected Not Detected    Comment: This nucleic acid amplification test was developed and its performance characteristics determined by [employer]. Nucleic acid amplification tests include RT-PCR and TMA. This test has not been FDA cleared or approved. This test has been authorized by FDA under an Emergency Use Authorization (EUA). This test is only authorized for the duration of time the declaration that circumstances exist justifying the authorization of the emergency use of in vitro diagnostic tests for detection of SARS-CoV-2 virus and/or diagnosis of COVID-19 infection under section 564(b)(1) of the Act, 21 U.S.C. 614ERX-5(Q) (1), unless the authorization is terminated or revoked sooner. When diagnostic testing is negative, the possibility of a false negative result should be considered in the context of a patient's recent exposures and the presence of clinical signs and symptoms consistent with COVID-19. An individual without symptoms of COVID-19 and who is not shedding SARS-CoV-2 virus wo uld expect to have a negative (not detected) result in this assay.   SARS-COV-2, NAA 2 DAY TAT     Status: None   Collection Time: 12/10/20 10:26 AM    Nasopharynge  Previous  Result Value Ref Range   SARS-CoV-2, NAA 2 DAY TAT Performed   POCT rapid strep A     Status: None   Collection Time: 12/10/20  1:12 PM  Result Value Ref Range   Rapid Strep A Screen Negative Negative  POCT Influenza A/B     Status: None   Collection Time: 12/10/20  1:13 PM  Result Value Ref Range   Influenza A, POC Negative Negative   Influenza B, POC Negative Negative   Objective  Body mass index is 27.67 kg/m. Wt Readings from Last 3 Encounters:  12/25/20 182 lb (82.6 kg)  12/10/20 182 lb (82.6 kg)  10/14/20 182 lb (82.6 kg)   Temp Readings from Last 3 Encounters:  12/25/20 (!) 97.2 F (36.2 C) (Temporal)  09/01/20 98 F (36.7 C) (Oral)  06/24/20 98.2 F (36.8 C) (Oral)   BP Readings from Last 3 Encounters:  12/25/20 124/86  12/10/20 130/78  10/14/20 122/75   Pulse Readings from Last 3 Encounters:  12/25/20 91  10/14/20 85  09/01/20 96    Physical Exam Vitals and nursing note reviewed.  Constitutional:      Appearance: Normal appearance. She is well-developed and well-groomed.  HENT:     Head: Normocephalic and atraumatic.  Eyes:     Conjunctiva/sclera: Conjunctivae normal.     Pupils: Pupils are equal, round, and reactive to light.  Cardiovascular:     Rate and Rhythm: Normal rate and regular rhythm.     Heart sounds: Normal heart sounds. No murmur heard. Pulmonary:     Effort: Pulmonary effort is normal.     Breath sounds: Normal breath sounds.  Chest:     Chest wall: No mass.  Breasts:    Breasts are symmetrical.     Left: Normal.       Comments: See note right  breast  Abdominal:     General: Abdomen is flat. Bowel sounds are normal.     Tenderness: There is no abdominal tenderness.     Hernia: A hernia is present. Hernia is present in the umbilical area.  Musculoskeletal:        General: No tenderness.  Lymphadenopathy:     Upper Body:     Right upper body: No axillary adenopathy.     Left upper body: No axillary  adenopathy.  Skin:    General: Skin is warm and dry.  Neurological:     General: No focal deficit present.     Mental Status: She is alert and oriented to person, place, and time. Mental status is at baseline.     Cranial Nerves: Cranial nerves 2-12 are intact.     Gait: Gait is intact.  Psychiatric:        Attention and Perception: Attention and perception normal.        Mood and Affect: Mood and affect normal.        Speech: Speech normal.        Behavior: Behavior normal. Behavior is cooperative.        Thought Content: Thought content normal.        Cognition and Memory: Cognition and memory normal.        Judgment: Judgment normal.    Assessment  Plan  Hypothyroidism, unspecified type - Plan: TSH, levothyroxine (SYNTHROID) 100 MCG tablet  Hypertension associated with diabetes ([place]) controlled - Plan: Hemoglobin A1c, Lipid panel, cont meds norvasc 2.5 benicar 20-12.5 mg qd metformin xr 500 pravachol 40 mg qhs   Malignant neoplasm of lower-outer quadrant of right breast of female, estrogen receptor positive ([place]) - Plan: MM DIAG BREAST TOMO BILATERAL  HM Flu shot  do in 1 week Tdap utd due in 2026  prevnar utd 10/03/19 pna 23 disc today could consider in the future  shingrix rxprev 06/24/20 covid 19 vaccine 3/3 pfizer consider booster    rheumatology Dr. Estanislado Pandy labd 04/2020  Skin dermatology Dr. Ubaldo Glassing est saw summer 2020 f/u yearly h/o nmsc chest appt scheduled as of 08/28/19   mammo [facility] 02/22/19 abnormal and repeat 03/09/19 right breast mass right breast rec biopsy did she get this Dr. Burr Medico onc appt 03/06/20 Mammogram 03/10/20 negative [facility]    Pap s/p total hysterectomy due to uterine prolapse h/o abnormal pap remotely ovaries removed    Colonoscopy will refer Dr. Alice Reichert had and EGD 08/14/19 with bxs neg barrete colonoscopy 5 years   dexa get [facility] osteopenia 02/22/19 with mammo  03/10/20 [facility] mammogram negatvie     Former smoker quit 1 year ago in 2019/2020 but ocass.  Light smoking 2x per year socially  Eyes doing after cataract surgery 02/13/19 Dr. Herbert Deaner  Surgery note 03/21/19 Dr. Georgette Dover    INVASIVE DUCTAL CARCINOMA OF RIGHT BREAST, STAGE 1 (C50.911) Impression: RLOQ 5 mm 5 cmfn IDC, ER/PR +, Her2 - 04/24/19 [facility] f/u Dr. Georgette Dover 03/28/19 right radioactive seed lumpectomy and sentinel ln bx invasive ductal ca 0.9 cm with some surrounding DCIS margins negative 2 nodes negative begin radiation next week with HT after radiation  F/u in 3 months On Tamoxifen as of 08/16/19  appt Dr. Darnell Level lung 09/18/19 to consider CT chest     rec healthy diet and exercise     Provider: Dr. Olivia Mackie McLean-Scocuzza-[facility] Medicine

## 2020-12-29 ENCOUNTER — Other Ambulatory Visit: Payer: Self-pay | Admitting: Physician Assistant

## 2020-12-29 DIAGNOSIS — M0579 Rheumatoid arthritis with rheumatoid factor of multiple sites without organ or systems involvement: Secondary | ICD-10-CM

## 2020-12-29 DIAGNOSIS — Z79899 Other long term (current) drug therapy: Secondary | ICD-10-CM

## 2020-12-29 NOTE — Addendum Note (Signed)
Addended by: Thressa Sheller on: 12/29/2020 12:01 PM   Modules accepted: Orders

## 2020-12-29 NOTE — Addendum Note (Signed)
Addended by: Thressa Sheller on: 12/29/2020 12:06 PM   Modules accepted: Orders

## 2020-12-29 NOTE — Telephone Encounter (Signed)
Next Visit: 03/17/2021  Last Visit: 10/14/2020  Last Fill: 09/23/2020  DX: Rheumatoid arthritis involving multiple sites with positive rheumatoid factor   Current Dose per office note 10/14/2020: Arava 20 mg 1 tablet by mouth daily  Labs: 09/01/2020 Neutro Abs 1.6, Sodium 146, Glucose 112, Alk. Phos 37  Patient advised she is due to update labs.   Okay to refill Arava?

## 2020-12-30 ENCOUNTER — Other Ambulatory Visit: Payer: Self-pay | Admitting: *Deleted

## 2020-12-30 ENCOUNTER — Telehealth: Payer: Self-pay

## 2020-12-30 DIAGNOSIS — Z79899 Other long term (current) drug therapy: Secondary | ICD-10-CM

## 2020-12-30 DIAGNOSIS — E1159 Type 2 diabetes mellitus with other circulatory complications: Secondary | ICD-10-CM | POA: Diagnosis not present

## 2020-12-30 DIAGNOSIS — I152 Hypertension secondary to endocrine disorders: Secondary | ICD-10-CM | POA: Diagnosis not present

## 2020-12-30 DIAGNOSIS — E039 Hypothyroidism, unspecified: Secondary | ICD-10-CM | POA: Diagnosis not present

## 2020-12-30 NOTE — Telephone Encounter (Signed)
Lab Orders released.  

## 2020-12-30 NOTE — Telephone Encounter (Signed)
Erica from [facility] in [place] called stating patient came to her facility at 7:30 am, but she didn't have any orders.  She states she took patient's blood and is requesting labwork orders be released in the computer.  If you have any questions, please call back at [phone]

## 2020-12-31 LAB — CBC WITH DIFFERENTIAL/PLATELET
Basophils Absolute: 0.1 10*3/uL (ref 0.0–0.2)
Basos: 2 %
EOS (ABSOLUTE): 0.3 10*3/uL (ref 0.0–0.4)
Eos: 6 %
Hematocrit: 42.7 % (ref 34.0–46.6)
Hemoglobin: 13.7 g/dL (ref 11.1–15.9)
Immature Grans (Abs): 0 10*3/uL (ref 0.0–0.1)
Immature Granulocytes: 0 %
Lymphocytes Absolute: 2.3 10*3/uL (ref 0.7–3.1)
Lymphs: 48 %
MCH: 29.8 pg (ref 26.6–33.0)
MCHC: 32.1 g/dL (ref 31.5–35.7)
MCV: 93 fL (ref 79–97)
Monocytes Absolute: 0.5 10*3/uL (ref 0.1–0.9)
Monocytes: 11 %
Neutrophils Absolute: 1.5 10*3/uL (ref 1.4–7.0)
Neutrophils: 33 %
Platelets: 301 10*3/uL (ref 150–450)
RBC: 4.59 x10E6/uL (ref 3.77–5.28)
RDW: 12.4 % (ref 11.7–15.4)
WBC: 4.7 10*3/uL (ref 3.4–10.8)

## 2020-12-31 LAB — CMP14+EGFR
ALT: 11 IU/L (ref 0–32)
AST: 14 IU/L (ref 0–40)
Albumin/Globulin Ratio: 1.8 (ref 1.2–2.2)
Albumin: 4.3 g/dL (ref 3.8–4.8)
Alkaline Phosphatase: 45 IU/L (ref 44–121)
BUN/Creatinine Ratio: 23 (ref 12–28)
BUN: 15 mg/dL (ref 8–27)
Bilirubin Total: 0.2 mg/dL (ref 0.0–1.2)
CO2: 24 mmol/L (ref 20–29)
Calcium: 10 mg/dL (ref 8.7–10.3)
Chloride: 105 mmol/L (ref 96–106)
Creatinine, Ser: 0.64 mg/dL (ref 0.57–1.00)
Globulin, Total: 2.4 g/dL (ref 1.5–4.5)
Glucose: 109 mg/dL — ABNORMAL HIGH (ref 70–99)
Potassium: 4 mmol/L (ref 3.5–5.2)
Sodium: 144 mmol/L (ref 134–144)
Total Protein: 6.7 g/dL (ref 6.0–8.5)
eGFR: 97 mL/min/{1.73_m2} (ref >59)

## 2020-12-31 LAB — LIPID PANEL
Chol/HDL Ratio: 4.1 ratio (ref 0.0–4.4)
Cholesterol, Total: 193 mg/dL (ref 100–199)
HDL: 47 mg/dL
LDL Chol Calc (NIH): 107 mg/dL — ABNORMAL HIGH (ref 0–99)
Triglycerides: 224 mg/dL — ABNORMAL HIGH (ref 0–149)
VLDL Cholesterol Cal: 39 mg/dL (ref 5–40)

## 2020-12-31 LAB — HEMOGLOBIN A1C
Est. average glucose Bld gHb Est-mCnc: 123 mg/dL
Hgb A1c MFr Bld: 5.9 % — ABNORMAL HIGH (ref 4.8–5.6)

## 2020-12-31 LAB — TSH: TSH: 1.54 u[IU]/mL (ref 0.450–4.500)

## 2020-12-31 NOTE — Progress Notes (Signed)
Glucose is 109.  Rest of CMP WNL.  CBC WNL.

## 2021-01-02 ENCOUNTER — Other Ambulatory Visit: Payer: Self-pay | Admitting: Internal Medicine

## 2021-01-02 DIAGNOSIS — E785 Hyperlipidemia, unspecified: Secondary | ICD-10-CM

## 2021-01-02 MED ORDER — SIMVASTATIN 40 MG PO TABS: 40.0000 mg | ORAL_TABLET | Freq: Every day | ORAL | 3 refills | Status: DC

## 2021-01-19 DIAGNOSIS — H16223 Keratoconjunctivitis sicca, not specified as Sjogren's, bilateral: Secondary | ICD-10-CM | POA: Diagnosis not present

## 2021-01-19 DIAGNOSIS — H35033 Hypertensive retinopathy, bilateral: Secondary | ICD-10-CM | POA: Diagnosis not present

## 2021-01-19 DIAGNOSIS — H40013 Open angle with borderline findings, low risk, bilateral: Secondary | ICD-10-CM | POA: Diagnosis not present

## 2021-01-19 DIAGNOSIS — H04123 Dry eye syndrome of bilateral lacrimal glands: Secondary | ICD-10-CM | POA: Diagnosis not present

## 2021-01-19 LAB — HM DIABETES EYE EXAM

## 2021-01-21 ENCOUNTER — Other Ambulatory Visit: Payer: Self-pay | Admitting: *Deleted

## 2021-01-21 MED ORDER — PILOCARPINE HCL 5 MG PO TABS: ORAL_TABLET | ORAL | 2 refills | Status: DC

## 2021-01-21 NOTE — Telephone Encounter (Signed)
Refill request received via fax  Next Visit: 03/17/2021   Last Visit: 10/14/2020   Last Fill: 10/14/2020  Current Dose per office note 10/14/2020: pilocarpine 5 mg 3 times daily as needed for symptomatic relief.   Dx: Sicca syndrome   Okay to refill pilocarpine?

## 2021-02-01 ENCOUNTER — Other Ambulatory Visit: Payer: Self-pay | Admitting: Physician Assistant

## 2021-02-01 DIAGNOSIS — M0579 Rheumatoid arthritis with rheumatoid factor of multiple sites without organ or systems involvement: Secondary | ICD-10-CM

## 2021-02-02 NOTE — Telephone Encounter (Signed)
Next Visit: 03/17/2021   Last Visit: 10/14/2020   Last Fill: 12/29/2020 (30 day supply)  Current Dose per office note 10/14/2020: Arava 20 mg 1 tablet by mouth daily  Labs: 12/30/2020 Glucose is 109.  Rest of CMP WNL.  CBC WNL.    Dx: Rheumatoid arthritis involving multiple sites with positive rheumatoid factor

## 2021-02-05 ENCOUNTER — Encounter: Payer: Self-pay | Admitting: Internal Medicine

## 2021-02-05 DIAGNOSIS — H04121 Dry eye syndrome of right lacrimal gland: Secondary | ICD-10-CM | POA: Diagnosis not present

## 2021-02-05 DIAGNOSIS — H04122 Dry eye syndrome of left lacrimal gland: Secondary | ICD-10-CM | POA: Diagnosis not present

## 2021-02-05 LAB — HM DIABETES EYE EXAM

## 2021-02-10 ENCOUNTER — Other Ambulatory Visit: Payer: Self-pay | Admitting: Internal Medicine

## 2021-02-10 DIAGNOSIS — M545 Low back pain, unspecified: Secondary | ICD-10-CM

## 2021-02-10 DIAGNOSIS — E039 Hypothyroidism, unspecified: Secondary | ICD-10-CM

## 2021-02-10 DIAGNOSIS — Z8669 Personal history of other diseases of the nervous system and sense organs: Secondary | ICD-10-CM

## 2021-02-10 DIAGNOSIS — I10 Essential (primary) hypertension: Secondary | ICD-10-CM

## 2021-03-04 NOTE — Progress Notes (Signed)
Office Visit Note  Patient: Cheryl Knight             Date of Birth: September 16, 1955           MRN: [id number]             PCP: McLean-Scocuzza, Nino Glow, MD Referring: Orland Mustard * Visit Date: 03/17/2021 Occupation: @GUAROCC @  Subjective:  Medication management  History of Present Illness: Cheryl Knight is a 66 y.o. female with history of rheumatoid arthritis and osteoarthritis.  She states her arthritis is quite well controlled on leflunomide 20 mg p.o. daily.  She has been taking leflunomide on a regular basis without any side effects.  She continues to have discomfort in her knee joints off and on.  She states increased activity causes increased pain.  She was not advised for a follow-up visit by the pulmonologist per patient.  She plans to schedule a follow-up visit.  She continues to have dry mouth and dry eye symptoms.  She had left eye lacrimal plugging.  She has noticed inflammation in her right eye lacrimal duct and she will follow-up with the ophthalmologist.  She has not noticed any psoriasis lesions recently.  Activities of Daily Living:  Patient reports morning stiffness for a few minutes.   Patient Denies nocturnal pain.  Difficulty dressing/grooming: Denies Difficulty climbing stairs: Denies Difficulty getting out of chair: Denies Difficulty using hands for taps, buttons, cutlery, and/or writing: Reports  Review of Systems  Constitutional:  Negative for fatigue.  HENT:  Positive for mouth dryness and nose dryness. Negative for mouth sores.   Eyes:  Positive for pain, itching and dryness.  Respiratory:  Negative for shortness of breath and difficulty breathing.   Cardiovascular:  Negative for chest pain and palpitations.  Gastrointestinal:  Negative for blood in stool, constipation and diarrhea.  Endocrine: Negative for increased urination.  Genitourinary:  Negative for difficulty urinating.  Musculoskeletal:  Positive for morning stiffness. Negative  for joint pain, joint pain, joint swelling, myalgias, muscle tenderness and myalgias.  Skin:  Positive for rash. Negative for color change, redness and sensitivity to sunlight.  Allergic/Immunologic: Negative for susceptible to infections.  Neurological:  Positive for parasthesias. Negative for dizziness, numbness, headaches, memory loss and weakness.  Hematological:  Negative for bruising/bleeding tendency.  Psychiatric/Behavioral:  Negative for confusion.    PMFS History:  Patient Active Problem List   Diagnosis Date Noted   Umbilical hernia without obstruction and without gangrene 12/25/2020   Influenza 12/10/2020   Fatty liver 06/24/2020   Annual physical exam 06/24/2020   Elevated liver enzymes 06/24/2020   Hypophosphatemia 02/28/2020   Acute cholecystitis 11/01/2019   Pulmonary fibrosis ([place]) 10/01/2019   Pulmonary nodules 10/01/2019   Bronchiectasis without complication ([place]) 28/41/3244   Aortic atherosclerosis ([place]) 10/01/2019   Hypertension associated with diabetes ([place]) 10/01/2019   Paresthesia of upper extremity 10/01/2019   Hiatal hernia 08/16/2019   Esophagitis 08/16/2019   Persistent cough 08/16/2019   Dry eye syndrome 08/05/2019   Glaucoma 08/05/2019   Hypertensive retinopathy 08/05/2019   Arteriosclerosis 08/05/2019   Pterygium 08/05/2019   Ocular rosacea 08/05/2019   Genetic testing 05/02/2019   Family history of breast cancer    Family history of prostate cancer    Family history of skin cancer    Family history of lung cancer    Malignant neoplasm of lower-outer quadrant of right breast of female, estrogen receptor positive ([place]) 03/19/2019   Type 2 diabetes mellitus with hyperglycemia, without long-term current  use of insulin ([place]) 02/08/2019   Allergic rhinitis 02/08/2019   Osteoarthritis    Primary osteoarthritis involving multiple joints 11/15/2017   Moderate asthma with acute exacerbation 05/03/2017   Routine general medical examination at a health  care facility 02/08/2017   Primary osteoarthritis of both feet 02/10/2016   Hx of migraine headaches 02/07/2016   History of hypothyroidism 02/07/2016   History of gastroesophageal reflux (GERD) 02/07/2016   Obesity (BMI 30-39.9) 12/30/2015   Basal cell carcinoma of chest wall 10/05/2011   Hypertension 09/01/2010   Hyperlipidemia 02/27/2008   Hypothyroidism 02/21/2007   Migraine 02/21/2007   VAGINITIS, ATROPHIC 02/21/2007   Psoriasis 02/21/2007   Sicca syndrome, unspecified 02/21/2007   GERD 06/29/2006   Rheumatoid arthritis ([place]) 06/29/2006    Past Medical History:  Diagnosis Date   Cancer ([place]) 02/2019   right breast IDC   Cataracts, bilateral    tbd surgery 8280   Complication of anesthesia    Cough    Difficult intubation    TRACH AT 19   Dyspnea    W/ COUGHING   Family history of breast cancer    Family history of lung cancer    Family history of prostate cancer    Family history of skin cancer    GERD (gastroesophageal reflux disease)    Headache    High cholesterol    History of hiatal hernia    Hypertension    Hypothyroidism    Irritable bowel    Osteoarthritis    i.e right knee    PONV (postoperative nausea and vomiting)    Pre-diabetes    Rheumatoid arthritis ([place])    Thyroid disease     Family History  Problem Relation Age of Onset   Hypertension Mother    Thyroid disease Mother    Breast cancer Mother 43       breast dx age 91 died age 25    Prostate cancer Father 49       metastatic   Stroke Maternal Grandmother    Skin cancer Maternal Uncle        dx. in his 24s   Lung cancer Maternal Uncle        smoker, died age 65   Past Surgical History:  Procedure Laterality Date   ABDOMINAL HYSTERECTOMY     age 56 ovaries out   BREAST LUMPECTOMY WITH RADIOACTIVE SEED AND SENTINEL LYMPH NODE BIOPSY Right 03/28/2019   Procedure: RIGHT BREAST LUMPECTOMY WITH RADIOACTIVE SEED AND SENTINEL LYMPH NODE BIOPSY;  Surgeon: Donnie Mesa, MD;  Location: [facility];  Service: General;  Laterality: Right;  PEC BLOCK FOR POST OP PAIN   BREAST SURGERY     CATARACT EXTRACTION, BILATERAL     CHOLECYSTECTOMY     10/2019   ESOPHAGEAL DILATION     Dr. 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  Constitutional:      Appearance: She is well-developed.  HENT:     Head: Normocephalic and atraumatic.  Eyes:     Conjunctiva/sclera: Conjunctivae normal.  Cardiovascular:     Rate and Rhythm: Normal rate and regular rhythm.     Heart sounds: Normal heart sounds.  Pulmonary:     Effort: Pulmonary effort is normal.     Breath sounds: Normal breath sounds.  Abdominal:     General: Bowel sounds are normal.     Palpations: Abdomen is soft.  Musculoskeletal:     Cervical back: Normal range of motion.  Lymphadenopathy:     Cervical: No cervical adenopathy.  Skin:    General: Skin is warm and dry.     Capillary Refill: Capillary refill takes less than 2 seconds.  Neurological:     Mental Status: She is alert and oriented to person, place, and time.  Psychiatric:        Behavior: Behavior normal.     Musculoskeletal Exam: C-spine was in good range of motion.  Shoulder joints, elbow joints, wrist joints, MCPs PIPs and DIPs with good range of motion.  She had bilateral PIP and DIP thickening.   No synovitis was noted.  Hip joints and knee joints in good range of motion without any warmth swelling or effusion.  There was no tenderness over ankles or MTPs.  CDAI Exam: CDAI Score: 0.6  Patient Global: 3 mm; Provider Global: 3 mm Swollen: 0 ; Tender: 0  Joint Exam 03/17/2021   No joint exam has been documented for this visit   There is currently no information documented on the homunculus. Go to the Rheumatology activity and complete the homunculus joint exam.  Investigation: No additional findings.  Imaging: No results found.  Recent Labs: Lab Results  Component Value Date   WBC 4.8 03/05/2021   HGB 13.4 03/05/2021   PLT 268 03/05/2021   NA 143 03/05/2021   K 4.3 03/05/2021   CL 108 03/05/2021   CO2 29 03/05/2021   GLUCOSE 120 (H) 03/05/2021   BUN 16 03/05/2021   CREATININE 0.64 03/05/2021   BILITOT 0.4 03/05/2021   ALKPHOS 37 (L) 03/05/2021   AST 17 03/05/2021   ALT 10 03/05/2021   PROT 6.8 03/05/2021   ALBUMIN 4.2 03/05/2021   CALCIUM 9.8 03/05/2021   GFRAA 108 04/22/2020   QFTBGOLDPLUS NEGATIVE 04/05/2019    Speciality Comments: No specialty comments available.  Procedures:  No procedures performed Allergies: Pentazocine lactate, Demerol [meperidine], and Lisinopril   Assessment / Plan:     Visit Diagnoses: Rheumatoid arthritis involving multiple sites with positive rheumatoid factor ([place]) - +RF: She had no synovitis on examination today.  She has intermittent stiffness otherwise she is doing well.  She has been tolerating leflunomide without any side effects.  I plan to obtain x-rays of her bilateral hands and her bilateral feet at the follow-up visit.  High risk medication use - Arava 20 mg 1 tablet by mouth daily.  (Previously on Methotrexate 0.8 ml every 7 days and folic acid 1 mg 2 tablets daily.)  Labs from February 2023 were reviewed which were within normal limits.  She was advised to get labs every 3 months.  Information on immunization was placed  in the AVS.  She was also advised to hold leflunomide in case she develops an infection.  ILD (  interstitial lung disease) ([place]) - Mild pulmonary fibrosis in an early "indeterminate for UIP" pattern.  Diagnosed on the basis of high-resolution CT. On 09/26/19.  I advised her to schedule a follow-up appointment with Dr. Patsey Berthold.  Sicca syndrome (HCC)-she continues to have dry mouth and dry eyes.  Over-the-counter products were discussed.  She was also advised to schedule appoint with her ophthalmologist if she is having irritation in her left eye.  Primary osteoarthritis of both knees-she had good response to Visco supplement injections in the past.  She has not had knee joint x-rays in a while.  Patient would like to do x-rays at the follow-up visit.  We will schedule viscosupplement injections after that.  Lower extremity muscle strengthening exercises were discussed.  Primary osteoarthritis of both feet-she denies any discomfort.  Psoriasis-she denies any active lesions.  History of hyperlipidemia-increased risk of heart disease with rheumatoid arthritis was discussed.  Dietary modifications and exercise was emphasized.  History of hypothyroidism  History of asthma - Diagnosed with asthma by Dr. Patsey Berthold.  History of migraine  Malignant neoplasm of lower-outer quadrant of right breast of female, estrogen receptor positive (HCC)-followed by oncologist.  History of gastroesophageal reflux (GERD)  Orders: No orders of the defined types were placed in this encounter.  No orders of the defined types were placed in this encounter.   Follow-Up Instructions: Return in about 5 months (around 08/14/2021) for Rheumatoid arthritis, Osteoarthritis.   Bo Merino, MD  Note - This record has been created using Editor, commissioning.  Chart creation errors have been sought, but may not always  have been located. Such creation errors do not reflect on  the standard of medical care.

## 2021-03-05 ENCOUNTER — Other Ambulatory Visit: Payer: Self-pay

## 2021-03-05 ENCOUNTER — Encounter: Payer: Self-pay | Admitting: Hematology

## 2021-03-05 ENCOUNTER — Inpatient Hospital Stay: Payer: Medicare HMO | Admitting: Hematology

## 2021-03-05 ENCOUNTER — Inpatient Hospital Stay: Payer: Medicare HMO | Attending: Nurse Practitioner | Admitting: Hematology

## 2021-03-05 VITALS — BP 125/77 | HR 88 | Temp 99.1°F | Resp 18 | Ht 68.0 in | Wt 184.8 lb

## 2021-03-05 DIAGNOSIS — Z79810 Long term (current) use of selective estrogen receptor modulators (SERMs): Secondary | ICD-10-CM | POA: Diagnosis not present

## 2021-03-05 DIAGNOSIS — Z170 Estrogen receptor positive status [ER+]: Secondary | ICD-10-CM

## 2021-03-05 DIAGNOSIS — E039 Hypothyroidism, unspecified: Secondary | ICD-10-CM | POA: Diagnosis not present

## 2021-03-05 DIAGNOSIS — E7800 Pure hypercholesterolemia, unspecified: Secondary | ICD-10-CM | POA: Insufficient documentation

## 2021-03-05 DIAGNOSIS — Z9049 Acquired absence of other specified parts of digestive tract: Secondary | ICD-10-CM | POA: Diagnosis not present

## 2021-03-05 DIAGNOSIS — M069 Rheumatoid arthritis, unspecified: Secondary | ICD-10-CM | POA: Insufficient documentation

## 2021-03-05 DIAGNOSIS — I10 Essential (primary) hypertension: Secondary | ICD-10-CM | POA: Insufficient documentation

## 2021-03-05 DIAGNOSIS — C50511 Malignant neoplasm of lower-outer quadrant of right female breast: Secondary | ICD-10-CM | POA: Diagnosis not present

## 2021-03-05 DIAGNOSIS — Z79899 Other long term (current) drug therapy: Secondary | ICD-10-CM | POA: Diagnosis not present

## 2021-03-05 DIAGNOSIS — Z7984 Long term (current) use of oral hypoglycemic drugs: Secondary | ICD-10-CM | POA: Diagnosis not present

## 2021-03-05 DIAGNOSIS — Z803 Family history of malignant neoplasm of breast: Secondary | ICD-10-CM | POA: Insufficient documentation

## 2021-03-05 DIAGNOSIS — Z8042 Family history of malignant neoplasm of prostate: Secondary | ICD-10-CM | POA: Insufficient documentation

## 2021-03-05 DIAGNOSIS — Z923 Personal history of irradiation: Secondary | ICD-10-CM | POA: Diagnosis not present

## 2021-03-05 DIAGNOSIS — G43909 Migraine, unspecified, not intractable, without status migrainosus: Secondary | ICD-10-CM | POA: Insufficient documentation

## 2021-03-05 LAB — CBC WITH DIFFERENTIAL/PLATELET
Abs Immature Granulocytes: 0.01 10*3/uL (ref 0.00–0.07)
Basophils Absolute: 0.1 10*3/uL (ref 0.0–0.1)
Basophils Relative: 1 %
Eosinophils Absolute: 0.2 10*3/uL (ref 0.0–0.5)
Eosinophils Relative: 3 %
HCT: 41.6 % (ref 36.0–46.0)
Hemoglobin: 13.4 g/dL (ref 12.0–15.0)
Immature Granulocytes: 0 %
Lymphocytes Relative: 53 %
Lymphs Abs: 2.6 10*3/uL (ref 0.7–4.0)
MCH: 30 pg (ref 26.0–34.0)
MCHC: 32.2 g/dL (ref 30.0–36.0)
MCV: 93.3 fL (ref 80.0–100.0)
Monocytes Absolute: 0.6 10*3/uL (ref 0.1–1.0)
Monocytes Relative: 12 %
Neutro Abs: 1.5 10*3/uL — ABNORMAL LOW (ref 1.7–7.7)
Neutrophils Relative %: 31 %
Platelets: 268 10*3/uL (ref 150–400)
RBC: 4.46 MIL/uL (ref 3.87–5.11)
RDW: 13.1 % (ref 11.5–15.5)
WBC: 4.8 10*3/uL (ref 4.0–10.5)
nRBC: 0 % (ref 0.0–0.2)

## 2021-03-05 LAB — COMPREHENSIVE METABOLIC PANEL
ALT: 10 U/L (ref 0–44)
AST: 17 U/L (ref 15–41)
Albumin: 4.2 g/dL (ref 3.5–5.0)
Alkaline Phosphatase: 37 U/L — ABNORMAL LOW (ref 38–126)
Anion gap: 6 (ref 5–15)
BUN: 16 mg/dL (ref 8–23)
CO2: 29 mmol/L (ref 22–32)
Calcium: 9.8 mg/dL (ref 8.9–10.3)
Chloride: 108 mmol/L (ref 98–111)
Creatinine, Ser: 0.64 mg/dL (ref 0.44–1.00)
GFR, Estimated: >60 mL/min (ref >60)
Glucose, Bld: 120 mg/dL — ABNORMAL HIGH (ref 70–99)
Potassium: 4.3 mmol/L (ref 3.5–5.1)
Sodium: 143 mmol/L (ref 135–145)
Total Bilirubin: 0.4 mg/dL (ref 0.3–1.2)
Total Protein: 6.8 g/dL (ref 6.5–8.1)

## 2021-03-05 MED ORDER — NYSTATIN 100000 UNIT/GM EX POWD: 1.0000 "application " | Freq: Three times a day (TID) | CUTANEOUS | 1 refills | Status: DC

## 2021-03-05 NOTE — Progress Notes (Signed)
Cheryl Knight   [phone] [phone]   Clinic Follow up Note   Patient Care Team: McLean-Scocuzza, Nino Glow, MD as PCP - General (Internal Medicine) Kyung Rudd, MD as Consulting Physician (Radiation Oncology) Truitt Merle, MD as Consulting Physician (Hematology) Donnie Mesa, MD as Consulting Physician (General Surgery) Mauro Kaufmann, RN as Oncology Nurse Navigator Rockwell Germany, RN as Oncology Nurse Navigator Alla Feeling, NP as Nurse Practitioner (Nurse Practitioner)  Date of Service:  03/05/2021  CHIEF COMPLAINT: f/u of right breast cancer  CURRENT THERAPY:  Tamoxifen starting 06/2019   ASSESSMENT & PLAN:  Cheryl Knight is a 66 y.o. female with   1. Malignant neoplasm of lower-outer quadrant of right breast, Stage IA, p(T1bN0M0), ER/PR+, HER2-, Grade I -Diagnosed in 02/2019, s/p right lumpectomy with SLNB on 03/28/19 by Dr Georgette Dover. Her path showed 0.9 cm mass complete resected with clear margins, node negative. She completed adjuvant radiation.  -She started antiestrogen therapy with Tamoxifen $RemoveBefore'20mg'PFyYGYxZBdpUj$  once daily in 06/2019. tolerating well overall. -she is clinically doing very well. Labs reviewed, no concern. Physical exam was unremarkable. She does have a mild fungal infection under her breast; I will call in nystatin powder for her. -she notes she is scheduled for mammogram and DEXA this week at [facility]. -continue surveillance and tamoxifen   2. Genetic testing -She had mother with late age breast cancer and father with prostate cancer in his 88s.  -[facility] panel 04/26/19, negative    3. Comorbidities: Osteoarthritis, Rheumatoid arthritis, Migraines, high cholesterol, hypothyroidism, HTN, GERD -On Arava $RemoveB'20mg'NAPcIhfc$  for RA -continue per PCP and medical team -she is s/p cholecystectomy 10/2019   4. Osteopenia  -Her 02/22/19 DEXA showed osteopenia with lowest T-score is -1.4 at left femur, no high risk for fracture yet.  -tamoxifen can strengthen  bone, continue calcium and Vit D supplements.  -Repeat DEXA 02/2021 with mammo   PLAN: -continue tamoxifen -mammogram and DEXA at [facility] this week -lab and f/u with NP Lacie in 1 year   No problem-specific Assessment & Plan notes found for this encounter.   SUMMARY OF ONCOLOGIC HISTORY: Oncology History Overview Note  Cancer Staging Malignant neoplasm of lower-outer quadrant of right breast of female, estrogen receptor positive ([place]) Staging form: Breast, AJCC 8th Edition - Clinical stage from 03/14/2019: Stage IA (cT1a, cN0, cM0, G1, ER+, PR+, HER2-) - Signed by Truitt Merle, MD on 03/20/2019 - Pathologic stage from 03/28/2019: Stage IA (pT1b, pN0(sn), cM0, G1, ER+, PR+, HER2-) - Unsigned    Malignant neoplasm of lower-outer quadrant of right breast of female, estrogen receptor positive ([place])  02/22/2019 Imaging   Bone Density Scan  02/22/19 DEXA shows osteopenia with lowest T-score -1.4 at left hip   03/09/2019 Mammogram   Diagnostic Mammogram 03/09/19 IMPRESSION the 28mm mass in 7:00 posiiton of right breast suspicous of malignancy.     03/14/2019 Cancer Staging   Staging form: Breast, AJCC 8th Edition - Clinical stage from 03/14/2019: Stage IA (cT1a, cN0, cM0, G1, ER+, PR+, HER2-) - Signed by Truitt Merle, MD on 03/20/2019    03/14/2019 Initial Biopsy   Diagnosis 03/14/19  Breast, right, needle core biopsy, 7 o'clock, 5cmfn - INVASIVE DUCTAL CARCINOMA. SEE NOTE Diagnosis Note Carcinoma measures 0.4 cm in greatest linear dimension and appears grade 1. Dr. Jeannie Done reviewed the case and concurs with the diagnosis. A breast prognostic profile (ER, PR, Ki-67 and HER2) is pending and will be reported in an addendum. Dr. Isaiah Blakes was notified on 11/12/2019.  03/14/2019 Receptors her2   PROGNOSTIC INDICATORS Results: IMMUNOHISTOCHEMICAL AND MORPHOMETRIC ANALYSIS PERFORMED MANUALLY The tumor cells are EQUIVOCAL for Her2 (2+). Her2 by FISH will be performed and results reported  separately. Estrogen Receptor: 95%, POSITIVE, STRONG STAINING INTENSITY Progesterone Receptor: 90%, POSITIVE, MODERATE STAINING INTENSITY Proliferation Marker Ki67: 10%   03/19/2019 Initial Diagnosis   Malignant neoplasm of lower-outer quadrant of right breast of female, estrogen receptor positive ([place])   03/28/2019 Surgery   RIGHT BREAST LUMPECTOMY WITH RADIOACTIVE SEED AND SENTINEL LYMPH NODE BIOPSY by Dr Georgette Dover    03/28/2019 Pathology Results   FINAL MICROSCOPIC DIAGNOSIS:   A. BREAST, RIGHT, LUMPECTOMY:  - Invasive ductal carcinoma, 0.9 cm, Nottingham grade 1 of 3.  - Ductal carcinoma in situ, intermediate nuclear grade.  - Margins of resection are not involved.       - Invasive carcinoma, closest margin: 3 mm, superior.       - In situ carcinoma, closest margin: 3 mm, medial.  - Biopsy site.  - See oncology table.   B. SENTINEL LYMPH NODE, RIGHT AXILLARY #1, BIOPSY:  - One lymph node, negative for carcinoma (0/1).   C. SENTINEL LYMPH NODE, RIGHT AXILLARY #2, BIOPSY:  - One lymph node, negative for carcinoma (0/1).    03/28/2019 Cancer Staging   Staging form: Breast, AJCC 8th Edition - Pathologic stage from 03/28/2019: Stage IA (pT1b, pN0(sn), cM0, G1, ER+, PR+, HER2-) - Signed by Truitt Merle, MD on 06/03/2019    04/26/2019 Genetic Testing   Negative genetic testing:  No pathogenic variants detected on the [facility] Common Hereditary Cancers panel. The report date is 04/26/2019.  The Common Hereditary Cancers Panel offered by [facility] includes sequencing and/or deletion duplication testing of the following 48 genes: APC, ATM, AXIN2, BARD1, BMPR1A, BRCA1, BRCA2, BRIP1, CDH1, CDK4, CDKN2A (p14ARF), CDKN2A (p16INK4a), CHEK2, CTNNA1, DICER1, EPCAM (Deletion/duplication testing only), GREM1 (promoter region deletion/duplication testing only), KIT, MEN1, MLH1, MSH2, MSH3, MSH6, MUTYH, NBN, NF1, NHTL1, PALB2, PDGFRA, PMS2, POLD1, POLE, PTEN, RAD50, RAD51C, RAD51D, RNF43, SDHB, SDHC, SDHD, SMAD4,  SMARCA4. STK11, TP53, TSC1, TSC2, and VHL.  The following genes were evaluated for sequence changes only: SDHA and HOXB13 c.251G>A variant only.    05/08/2019 - 06/04/2019 Radiation Therapy   Adjuvant Radiation with Dr Lisbeth Renshaw    06/2019 -  Anti-estrogen oral therapy   Tamoxifen starting in 06/2019    09/04/2019 Survivorship   SCP delivered by Cira Rue, NP       INTERVAL HISTORY:  Cheryl Knight is here for a follow up of breast cancer. She was last seen by me on 09/02/20. She presents to the clinic alone. She reports she had her gallbladder removed since her last visit (chart review shows this was performed in 10/2019). She notes this caused her to lose a lot of weight.  She notes she is having intermittent pain in her right breast, but this resolves on its own. She adds it is located near her incision and could be affected by her bra.   All other systems were reviewed with the patient and are negative.  MEDICAL HISTORY:  Past Medical History:  Diagnosis Date   Cancer ([place]) 02/2019   right breast IDC   Cataracts, bilateral    tbd surgery 8938   Complication of anesthesia    Cough    Difficult intubation    TRACH AT 21   Dyspnea    W/ COUGHING   Family history of breast cancer    Family history of lung cancer  Family history of prostate cancer    Family history of skin cancer    GERD (gastroesophageal reflux disease)    Headache    High cholesterol    History of hiatal hernia    Hypertension    Hypothyroidism    Irritable bowel    Osteoarthritis    i.e right knee    PONV (postoperative nausea and vomiting)    Pre-diabetes    Rheumatoid arthritis ([place])    Thyroid disease     SURGICAL HISTORY: Past Surgical History:  Procedure Laterality Date   ABDOMINAL HYSTERECTOMY     age 70 ovaries out   BREAST LUMPECTOMY WITH RADIOACTIVE SEED AND SENTINEL LYMPH NODE BIOPSY Right 03/28/2019   Procedure: RIGHT BREAST LUMPECTOMY WITH RADIOACTIVE SEED AND SENTINEL LYMPH NODE  BIOPSY;  Surgeon: Donnie Mesa, MD;  Location: [facility];  Service: General;  Laterality: Right;  PEC BLOCK FOR POST OP PAIN   BREAST SURGERY     CATARACT EXTRACTION, BILATERAL     CHOLECYSTECTOMY     10/2019   ESOPHAGEAL DILATION     Dr. Olevia Perches 15 years from [place]      I have reviewed the social history and family history with the patient and they are unchanged from previous note.  ALLERGIES:  is allergic to pentazocine lactate, demerol [meperidine], and lisinopril.  MEDICATIONS:  Current Outpatient Medications  Medication Sig Dispense Refill   leflunomide (ARAVA) 20 MG tablet TAKE 1 TABLET(20 MG) BY MOUTH DAILY 90 tablet 0   nystatin (MYCOSTATIN/NYSTOP) powder Apply 1 application topically 3 (three) times daily. To skin rashes underneath breasts 30 g 1   amLODipine (NORVASC) 2.5 MG tablet TAKE 1 TABLET(2.5 MG) BY MOUTH DAILY 90 tablet 3   Calcium Carbonate Antacid (TUMS CHEWY BITES PO) Take 1 tablet by mouth at bedtime as needed (acid reflux).     cyclobenzaprine (FLEXERIL) 10 MG tablet TAKE 1 TABLET(10 MG) BY MOUTH DAILY AS NEEDED 90 tablet 1   esomeprazole (NEXIUM) 40 MG capsule Take 1 capsule (40 mg total) by mouth daily. 90 capsule 1   famotidine-calcium carbonate-magnesium hydroxide (PEPCID COMPLETE) 10-800-165 MG chewable tablet Chew 1 tablet by mouth at bedtime.     glucosamine-chondroitin 500-400 MG tablet Take 2 tablets by mouth daily.      levothyroxine (SYNTHROID) 100 MCG tablet TAKE 1 TABLET BY MOUTH EVERY DAY 30 MINUTES BEFORE FOOD 90 tablet 3   metFORMIN (GLUCOPHAGE-XR) 500 MG 24 hr tablet TAKE 1 TABLET(500 MG) BY MOUTH DAILY WITH BREAKFAST 90 tablet 3   Multiple Vitamin (MULTIVITAMIN WITH MINERALS) TABS tablet Take 1 tablet by mouth daily.     olmesartan-hydrochlorothiazide (BENICAR HCT) 20-12.5 MG tablet TAKE 1 TABLET BY MOUTH DAILY IN THE MORNING 90 tablet 3   promethazine (PHENERGAN) 25 MG tablet Take 1 tablet (25  mg total) by mouth 2 (two) times daily as needed for nausea or vomiting. 60 tablet 5   RESTASIS 0.05 % ophthalmic emulsion Place 1 drop into both eyes 2 (two) times daily.      simvastatin (ZOCOR) 40 MG tablet Take 1 tablet (40 mg total) by mouth at bedtime. After 6 pm d/c pravachol 40 90 tablet 3   SUMAtriptan (IMITREX) 50 MG tablet Take 1 tablet (50 mg total) by mouth every 2 (two) hours as needed for migraine.     tamoxifen (NOLVADEX) 20 MG tablet TAKE 1 TABLET(20 MG) BY MOUTH DAILY 90 tablet 3   topiramate (  TOPAMAX) 100 MG tablet TAKE 1 TABLET(100 MG) BY MOUTH DAILY 90 tablet 3   No current facility-administered medications for this visit.    PHYSICAL EXAMINATION: ECOG PERFORMANCE STATUS: 1 - Symptomatic but completely ambulatory  Vitals:   03/05/21 1122  BP: 125/77  Pulse: 88  Resp: 18  Temp: 99.1 F (37.3 C)  SpO2: 97%   Wt Readings from Last 3 Encounters:  03/05/21 184 lb 12.8 oz (83.8 kg)  12/25/20 182 lb (82.6 kg)  12/10/20 182 lb (82.6 kg)     GENERAL:alert, no distress and comfortable SKIN: skin color, texture, turgor are normal, no rashes or significant lesions EYES: normal, Conjunctiva are pink and non-injected, sclera clear  NECK: supple, thyroid normal size, non-tender, without nodularity LYMPH:  no palpable lymphadenopathy in the cervical, axillary  LUNGS: clear to auscultation and percussion with normal breathing effort HEART: regular rate & rhythm and no murmurs and no lower extremity edema NEURO: alert & oriented x 3 with fluent speech, no focal motor/sensory deficits BREAST: (+) incision well healed, mild erythema and lymphedema present, as well as scar tissue. (+) mild fungal infection under right breast. No palpable mass, nodules or adenopathy bilaterally. Breast exam benign.   LABORATORY DATA:  I have reviewed the data as listed CBC Latest Ref Rng & Units 03/05/2021 12/30/2020 09/01/2020  WBC 4.0 - 10.5 K/uL 4.8 4.7 4.7  Hemoglobin 12.0 - 15.0 g/dL 13.4  13.7 13.8  Hematocrit 36.0 - 46.0 % 41.6 42.7 42.2  Platelets 150 - 400 K/uL 268 301 268     CMP Latest Ref Rng & Units 03/05/2021 12/30/2020 09/01/2020  Glucose 70 - 99 mg/dL 120(H) 109(H) 112(H)  BUN 8 - 23 mg/dL $Remove'16 15 19  'JfdvAVD$ Creatinine 0.44 - 1.00 mg/dL 0.64 0.64 0.73  Sodium 135 - 145 mmol/L 143 144 146(H)  Potassium 3.5 - 5.1 mmol/L 4.3 4.0 3.9  Chloride 98 - 111 mmol/L 108 105 109  CO2 22 - 32 mmol/L $RemoveB'29 24 24  'iVBnXrTP$ Calcium 8.9 - 10.3 mg/dL 9.8 10.0 9.9  Total Protein 6.5 - 8.1 g/dL 6.8 6.7 7.2  Total Bilirubin 0.3 - 1.2 mg/dL 0.4 0.2 0.4  Alkaline Phos 38 - 126 U/L 37(L) 45 37(L)  AST 15 - 41 U/L $Remo'17 14 18  'RXwzO$ ALT 0 - 44 U/L $Remo'10 11 13      'nwQfo$ RADIOGRAPHIC STUDIES: I have personally reviewed the radiological images as listed and agreed with the findings in the report. No results found.    No orders of the defined types were placed in this encounter.  All questions were answered. The patient knows to call the clinic with any problems, questions or concerns. No barriers to learning was detected. The total time spent in the appointment was 25 minutes.     Truitt Merle, MD 03/05/2021   I, Wilburn Mylar, am acting as scribe for Truitt Merle, MD.   I have reviewed the above documentation for accuracy and completeness, and I agree with the above.

## 2021-03-06 NOTE — Progress Notes (Signed)
Okay to continue on current dose of her arava.  LFTs WNL.

## 2021-03-11 DIAGNOSIS — Z853 Personal history of malignant neoplasm of breast: Secondary | ICD-10-CM | POA: Diagnosis not present

## 2021-03-11 LAB — HM MAMMOGRAPHY

## 2021-03-12 ENCOUNTER — Encounter: Payer: Self-pay | Admitting: Internal Medicine

## 2021-03-17 ENCOUNTER — Ambulatory Visit: Payer: Medicare HMO | Admitting: Rheumatology

## 2021-03-17 ENCOUNTER — Encounter: Payer: Self-pay | Admitting: Rheumatology

## 2021-03-17 ENCOUNTER — Other Ambulatory Visit: Payer: Self-pay

## 2021-03-17 VITALS — BP 137/79 | HR 98 | Ht 68.0 in | Wt 181.0 lb

## 2021-03-17 DIAGNOSIS — Z8709 Personal history of other diseases of the respiratory system: Secondary | ICD-10-CM

## 2021-03-17 DIAGNOSIS — M0579 Rheumatoid arthritis with rheumatoid factor of multiple sites without organ or systems involvement: Secondary | ICD-10-CM

## 2021-03-17 DIAGNOSIS — Z8719 Personal history of other diseases of the digestive system: Secondary | ICD-10-CM

## 2021-03-17 DIAGNOSIS — J849 Interstitial pulmonary disease, unspecified: Secondary | ICD-10-CM

## 2021-03-17 DIAGNOSIS — M170 Bilateral primary osteoarthritis of knee: Secondary | ICD-10-CM

## 2021-03-17 DIAGNOSIS — Z8639 Personal history of other endocrine, nutritional and metabolic disease: Secondary | ICD-10-CM | POA: Diagnosis not present

## 2021-03-17 DIAGNOSIS — Z170 Estrogen receptor positive status [ER+]: Secondary | ICD-10-CM

## 2021-03-17 DIAGNOSIS — M19071 Primary osteoarthritis, right ankle and foot: Secondary | ICD-10-CM

## 2021-03-17 DIAGNOSIS — M3500 Sicca syndrome, unspecified: Secondary | ICD-10-CM | POA: Diagnosis not present

## 2021-03-17 DIAGNOSIS — Z79899 Other long term (current) drug therapy: Secondary | ICD-10-CM | POA: Diagnosis not present

## 2021-03-17 DIAGNOSIS — Z8669 Personal history of other diseases of the nervous system and sense organs: Secondary | ICD-10-CM | POA: Diagnosis not present

## 2021-03-17 DIAGNOSIS — L409 Psoriasis, unspecified: Secondary | ICD-10-CM | POA: Diagnosis not present

## 2021-03-17 DIAGNOSIS — M19072 Primary osteoarthritis, left ankle and foot: Secondary | ICD-10-CM

## 2021-03-17 DIAGNOSIS — C50511 Malignant neoplasm of lower-outer quadrant of right female breast: Secondary | ICD-10-CM

## 2021-03-17 NOTE — Patient Instructions (Signed)
Standing Labs We placed an order today for your standing lab work.   Please have your standing labs drawn in May and every 3 months  If possible, please have your labs drawn 2 weeks prior to your appointment so that the provider can discuss your results at your appointment.  Please note that you may see your imaging and lab results in [place] before we have reviewed them. We may be awaiting multiple results to interpret others before contacting you. Please allow our office up to 72 hours to thoroughly review all of the results before contacting the office for clarification of your results.  We have open lab daily: Monday through Thursday from 1:30-4:30 PM and Friday from 1:30-4:00 PM at the office of Dr. Bo Merino, [facility].   Please be advised, all patients with office appointments requiring lab work will take precedent over walk-in lab work.  If possible, please come for your lab work on Monday and Friday afternoons, as you may experience shorter wait times. The office is located at [address] [place], [place], [place] [address] No appointment is necessary.   Labs are drawn by [facility]. Please bring your co-pay at the time of your lab draw.  You may receive a bill from [place] for your lab work.  Please note if you are on Hydroxychloroquine and and an order has been placed for a Hydroxychloroquine level, you will need to have it drawn 4 hours or more after your last dose.  If you wish to have your labs drawn at another location, please call the office 24 hours in advance to send orders.  If you have any questions regarding directions or hours of operation,  please call [phone].   As a reminder, please drink plenty of water prior to coming for your lab work. Thanks!   Vaccines You are taking a medication(s) that can suppress your immune system.  The following immunizations are recommended: Flu annually Covid-19  Td/Tdap (tetanus, diphtheria, pertussis)  every 10 years Pneumonia (Prevnar 15 then Pneumovax 23 at least 1 year apart.  Alternatively, can take Prevnar 20 without needing additional dose) Shingrix: 2 doses from 4 weeks to 6 months apart  Please check with your PCP to make sure you are up to date.   If you have signs or symptoms of an infection or start antibiotics: First, call your PCP for workup of your infection. Hold your medication through the infection, until you complete your antibiotics, and until symptoms resolve if you take the following: Injectable medication (Actemra, Benlysta, Cimzia, Cosentyx, Enbrel, Humira, Kevzara, Orencia, Remicade, Simponi, Stelara, Taltz, Tremfya) Methotrexate Leflunomide (Arava) Mycophenolate (Cellcept) Morrie Sheldon, Olumiant, or Rinvoq

## 2021-03-19 DIAGNOSIS — Z780 Asymptomatic menopausal state: Secondary | ICD-10-CM | POA: Diagnosis not present

## 2021-03-19 DIAGNOSIS — M8589 Other specified disorders of bone density and structure, multiple sites: Secondary | ICD-10-CM | POA: Diagnosis not present

## 2021-03-19 LAB — HM DEXA SCAN

## 2021-03-20 ENCOUNTER — Encounter: Payer: Self-pay | Admitting: Internal Medicine

## 2021-03-23 NOTE — Telephone Encounter (Signed)
Please advise on Dexa from 03/19/21. Could not see full report in chart  ?

## 2021-03-24 ENCOUNTER — Other Ambulatory Visit: Payer: Self-pay

## 2021-03-25 DIAGNOSIS — D2261 Melanocytic nevi of right upper limb, including shoulder: Secondary | ICD-10-CM | POA: Diagnosis not present

## 2021-03-25 DIAGNOSIS — L720 Epidermal cyst: Secondary | ICD-10-CM | POA: Diagnosis not present

## 2021-03-25 DIAGNOSIS — L814 Other melanin hyperpigmentation: Secondary | ICD-10-CM | POA: Diagnosis not present

## 2021-03-25 DIAGNOSIS — L821 Other seborrheic keratosis: Secondary | ICD-10-CM | POA: Diagnosis not present

## 2021-03-25 DIAGNOSIS — L304 Erythema intertrigo: Secondary | ICD-10-CM | POA: Diagnosis not present

## 2021-03-25 DIAGNOSIS — D485 Neoplasm of uncertain behavior of skin: Secondary | ICD-10-CM | POA: Diagnosis not present

## 2021-03-25 DIAGNOSIS — Z85828 Personal history of other malignant neoplasm of skin: Secondary | ICD-10-CM | POA: Diagnosis not present

## 2021-03-25 DIAGNOSIS — L570 Actinic keratosis: Secondary | ICD-10-CM | POA: Diagnosis not present

## 2021-03-27 ENCOUNTER — Encounter: Payer: Self-pay | Admitting: Internal Medicine

## 2021-03-30 ENCOUNTER — Other Ambulatory Visit: Payer: Self-pay | Admitting: Internal Medicine

## 2021-03-30 DIAGNOSIS — Z8669 Personal history of other diseases of the nervous system and sense organs: Secondary | ICD-10-CM

## 2021-03-30 MED ORDER — SUMATRIPTAN SUCCINATE 50 MG PO TABS: 50.0000 mg | ORAL_TABLET | ORAL | 11 refills | Status: DC | PRN

## 2021-03-30 NOTE — Telephone Encounter (Signed)
Medication last sent in by [place], Robie Ridge, DO. Are you taking over this medication?  ?

## 2021-04-06 ENCOUNTER — Telehealth: Payer: Self-pay | Admitting: Internal Medicine

## 2021-04-06 NOTE — Telephone Encounter (Signed)
Insurance will not cover Sumatriptan see message attached for alternatives , Rizatriptan is the alternative. ?

## 2021-04-08 ENCOUNTER — Other Ambulatory Visit: Payer: Self-pay | Admitting: Internal Medicine

## 2021-04-08 DIAGNOSIS — G43911 Migraine, unspecified, intractable, with status migrainosus: Secondary | ICD-10-CM

## 2021-04-08 MED ORDER — RIZATRIPTAN BENZOATE 10 MG PO TABS: 10.0000 mg | ORAL_TABLET | ORAL | 5 refills | Status: DC | PRN

## 2021-04-09 ENCOUNTER — Other Ambulatory Visit: Payer: Self-pay | Admitting: Internal Medicine

## 2021-04-09 DIAGNOSIS — Z8669 Personal history of other diseases of the nervous system and sense organs: Secondary | ICD-10-CM

## 2021-04-09 MED ORDER — SUMATRIPTAN SUCCINATE 50 MG PO TABS: 50.0000 mg | ORAL_TABLET | ORAL | 11 refills | Status: DC | PRN

## 2021-04-13 ENCOUNTER — Encounter: Payer: Self-pay | Admitting: Internal Medicine

## 2021-04-14 NOTE — Telephone Encounter (Signed)
Okay for you to take both? ?

## 2021-05-06 ENCOUNTER — Other Ambulatory Visit: Payer: Self-pay | Admitting: Rheumatology

## 2021-05-06 DIAGNOSIS — M0579 Rheumatoid arthritis with rheumatoid factor of multiple sites without organ or systems involvement: Secondary | ICD-10-CM

## 2021-05-07 NOTE — Telephone Encounter (Signed)
Next Visit: 08/14/2021 ? ?Last Visit: 03/17/2021 ? ?Last Fill: 02/02/2021 ? ?DX: Rheumatoid arthritis involving multiple sites with positive rheumatoid factor  ? ?Current Dose per office note 03/17/2021: Arava 20 mg 1 tablet by mouth daily.  ? ?Labs: Glucose 120, Alk. Phos. 37, Neutro Abs. 1.5,  ? ?Okay to refill Arava?  ?

## 2021-06-13 ENCOUNTER — Other Ambulatory Visit: Payer: Self-pay | Admitting: Internal Medicine

## 2021-06-25 ENCOUNTER — Ambulatory Visit: Payer: Medicare HMO | Admitting: Internal Medicine

## 2021-07-22 ENCOUNTER — Ambulatory Visit (INDEPENDENT_AMBULATORY_CARE_PROVIDER_SITE_OTHER): Payer: Medicare HMO | Admitting: Internal Medicine

## 2021-07-22 ENCOUNTER — Encounter: Payer: Self-pay | Admitting: Internal Medicine

## 2021-07-22 VITALS — BP 130/80 | HR 70 | Temp 97.9°F | Resp 14 | Ht 68.0 in | Wt 186.2 lb

## 2021-07-22 DIAGNOSIS — Z23 Encounter for immunization: Secondary | ICD-10-CM

## 2021-07-22 DIAGNOSIS — E1165 Type 2 diabetes mellitus with hyperglycemia: Secondary | ICD-10-CM | POA: Diagnosis not present

## 2021-07-22 DIAGNOSIS — G43911 Migraine, unspecified, intractable, with status migrainosus: Secondary | ICD-10-CM

## 2021-07-22 DIAGNOSIS — I10 Essential (primary) hypertension: Secondary | ICD-10-CM | POA: Diagnosis not present

## 2021-07-22 DIAGNOSIS — M79621 Pain in right upper arm: Secondary | ICD-10-CM

## 2021-07-22 DIAGNOSIS — Z0000 Encounter for general adult medical examination without abnormal findings: Secondary | ICD-10-CM

## 2021-07-22 DIAGNOSIS — M545 Low back pain, unspecified: Secondary | ICD-10-CM | POA: Diagnosis not present

## 2021-07-22 DIAGNOSIS — I152 Hypertension secondary to endocrine disorders: Secondary | ICD-10-CM

## 2021-07-22 DIAGNOSIS — E785 Hyperlipidemia, unspecified: Secondary | ICD-10-CM

## 2021-07-22 DIAGNOSIS — Z1231 Encounter for screening mammogram for malignant neoplasm of breast: Secondary | ICD-10-CM

## 2021-07-22 DIAGNOSIS — E119 Type 2 diabetes mellitus without complications: Secondary | ICD-10-CM

## 2021-07-22 DIAGNOSIS — E559 Vitamin D deficiency, unspecified: Secondary | ICD-10-CM | POA: Diagnosis not present

## 2021-07-22 DIAGNOSIS — E1159 Type 2 diabetes mellitus with other circulatory complications: Secondary | ICD-10-CM | POA: Diagnosis not present

## 2021-07-22 DIAGNOSIS — E039 Hypothyroidism, unspecified: Secondary | ICD-10-CM | POA: Diagnosis not present

## 2021-07-22 DIAGNOSIS — Z8669 Personal history of other diseases of the nervous system and sense organs: Secondary | ICD-10-CM | POA: Diagnosis not present

## 2021-07-22 LAB — CBC WITH DIFFERENTIAL/PLATELET
Basophils Absolute: 0 10*3/uL (ref 0.0–0.1)
Basophils Relative: 1.1 % (ref 0.0–3.0)
Eosinophils Absolute: 0.2 10*3/uL (ref 0.0–0.7)
Eosinophils Relative: 5.5 % — ABNORMAL HIGH (ref 0.0–5.0)
HCT: 40.6 % (ref 36.0–46.0)
Hemoglobin: 13.3 g/dL (ref 12.0–15.0)
Lymphocytes Relative: 46.1 % — ABNORMAL HIGH (ref 12.0–46.0)
Lymphs Abs: 1.8 10*3/uL (ref 0.7–4.0)
MCHC: 32.8 g/dL (ref 30.0–36.0)
MCV: 92.7 fl (ref 78.0–100.0)
Monocytes Absolute: 0.5 10*3/uL (ref 0.1–1.0)
Monocytes Relative: 13.3 % — ABNORMAL HIGH (ref 3.0–12.0)
Neutro Abs: 1.3 10*3/uL — ABNORMAL LOW (ref 1.4–7.7)
Neutrophils Relative %: 34 % — ABNORMAL LOW (ref 43.0–77.0)
Platelets: 258 10*3/uL (ref 150.0–400.0)
RBC: 4.38 Mil/uL (ref 3.87–5.11)
RDW: 13.4 % (ref 11.5–15.5)
WBC: 4 10*3/uL (ref 4.0–10.5)

## 2021-07-22 LAB — LIPID PANEL
Cholesterol: 170 mg/dL (ref 0–200)
HDL: 43.6 mg/dL (ref >39.00)
LDL Cholesterol: 92 mg/dL (ref 0–99)
NonHDL: 126.27
Total CHOL/HDL Ratio: 4
Triglycerides: 172 mg/dL — ABNORMAL HIGH (ref 0.0–149.0)
VLDL: 34.4 mg/dL (ref 0.0–40.0)

## 2021-07-22 LAB — COMPREHENSIVE METABOLIC PANEL
ALT: 13 U/L (ref 0–35)
AST: 19 U/L (ref 0–37)
Albumin: 4.4 g/dL (ref 3.5–5.2)
Alkaline Phosphatase: 33 U/L — ABNORMAL LOW (ref 39–117)
BUN: 14 mg/dL (ref 6–23)
CO2: 27 mEq/L (ref 19–32)
Calcium: 9.9 mg/dL (ref 8.4–10.5)
Chloride: 106 mEq/L (ref 96–112)
Creatinine, Ser: 0.62 mg/dL (ref 0.40–1.20)
GFR: 91.86 mL/min (ref >60.00)
Glucose, Bld: 115 mg/dL — ABNORMAL HIGH (ref 70–99)
Potassium: 4.2 mEq/L (ref 3.5–5.1)
Sodium: 143 mEq/L (ref 135–145)
Total Bilirubin: 0.4 mg/dL (ref 0.2–1.2)
Total Protein: 6.7 g/dL (ref 6.0–8.3)

## 2021-07-22 LAB — VITAMIN D 25 HYDROXY (VIT D DEFICIENCY, FRACTURES): VITD: 52.84 ng/mL (ref 30.00–100.00)

## 2021-07-22 LAB — HEMOGLOBIN A1C: Hgb A1c MFr Bld: 6.4 % (ref 4.6–6.5)

## 2021-07-22 LAB — TSH: TSH: 0.45 u[IU]/mL (ref 0.35–5.50)

## 2021-07-22 MED ORDER — CYCLOBENZAPRINE HCL 10 MG PO TABS: ORAL_TABLET | ORAL | 3 refills | Status: DC

## 2021-07-22 MED ORDER — OLMESARTAN MEDOXOMIL-HCTZ 20-12.5 MG PO TABS: 1.0000 | ORAL_TABLET | Freq: Every morning | ORAL | 3 refills | Status: DC

## 2021-07-22 MED ORDER — LEVOTHYROXINE SODIUM 100 MCG PO TABS: ORAL_TABLET | ORAL | 3 refills | Status: DC

## 2021-07-22 MED ORDER — METFORMIN HCL ER 500 MG PO TB24: ORAL_TABLET | ORAL | 3 refills | Status: DC

## 2021-07-22 MED ORDER — SHINGRIX 50 MCG/0.5ML IM SUSR: 0.5000 mL | Freq: Once | INTRAMUSCULAR | 1 refills | Status: AC

## 2021-07-22 MED ORDER — SIMVASTATIN 40 MG PO TABS
40.0000 mg | ORAL_TABLET | Freq: Every day | ORAL | 3 refills | Status: DC
Start: 2021-07-22 — End: 2022-01-15

## 2021-07-22 MED ORDER — ESOMEPRAZOLE MAGNESIUM 40 MG PO CPDR: DELAYED_RELEASE_CAPSULE | ORAL | 3 refills | Status: DC

## 2021-07-22 MED ORDER — AMLODIPINE BESYLATE 2.5 MG PO TABS: ORAL_TABLET | ORAL | 3 refills | Status: DC

## 2021-07-22 MED ORDER — TOPIRAMATE 100 MG PO TABS: ORAL_TABLET | ORAL | 3 refills | Status: DC

## 2021-07-22 NOTE — Patient Instructions (Addendum)
Shingrix x 2 doses  Consider covid shot in fall 2023   Consider right underarm [place]    Pneumococcal Conjugate Vaccine (Prevnar 20) Suspension for Injection What is this medication? PNEUMOCOCCAL VACCINE (NEU mo KOK al vak SEEN) is a vaccine. It prevents pneumococcus bacterial infections. These bacteria can cause serious infections like pneumonia, meningitis, and blood infections. This vaccine will not treat an infection and will not cause infection. This vaccine is recommended for adults 18 years and older. This medicine may be used for other purposes; ask your health care provider or pharmacist if you have questions. COMMON BRAND NAME(S): Prevnar 20 What should I tell my care team before I take this medication? They need to know if you have any of these conditions: bleeding disorder fever immune system problems an unusual or allergic reaction to pneumococcal vaccine, diphtheria toxoid, other vaccines, other medicines, foods, dyes, or preservatives pregnant or trying to get pregnant breast-feeding How should I use this medication? This vaccine is injected into a muscle. It is given by a health care provider. A copy of Vaccine Information Statements will be given before each vaccination. Be sure to read this information carefully each time. This sheet may change often. Talk to your health care provider about the use of this medicine in children. Special care may be needed. Overdosage: If you think you have taken too much of this medicine contact a poison control center or emergency room at once. NOTE: This medicine is only for you. Do not share this medicine with others. What if I miss a dose? This does not apply. This medicine is not for regular use. What may interact with this medication? medicines for cancer chemotherapy medicines that suppress your immune function steroid medicines like prednisone or cortisone This list may not describe all possible interactions. Give your health care  provider a list of all the medicines, herbs, non-prescription drugs, or dietary supplements you use. Also tell them if you smoke, drink alcohol, or use illegal drugs. Some items may interact with your medicine. What should I watch for while using this medication? Mild fever and pain should go away in 3 days or less. Report any unusual symptoms to your health care provider. What side effects may I notice from receiving this medication? Side effects that you should report to your doctor or health care professional as soon as possible: allergic reactions (skin rash, itching or hives; swelling of the face, lips, or tongue) confusion fast, irregular heartbeat fever over 102 degrees F muscle weakness seizures trouble breathing unusual bruising or bleeding Side effects that usually do not require medical attention (report to your doctor or health care professional if they continue or are bothersome): fever of 102 degrees F or less headache joint pain muscle cramps, pain pain, tender at site where injected This list may not describe all possible side effects. Call your doctor for medical advice about side effects. You may report side effects to FDA at 1-800-FDA-1088. Where should I keep my medication? This vaccine is only given by a health care provider. It will not be stored at home. NOTE: This sheet is a summary. It may not cover all possible information. If you have questions about this medicine, talk to your doctor, pharmacist, or health care provider.  2023 Elsevier/Gold Standard (2019-09-20 00:00:00)  Zoster Vaccine, Recombinant injection What is this medication? ZOSTER VACCINE (ZOS ter vak SEEN) is a vaccine used to reduce the risk of getting shingles. This vaccine is not used to treat shingles or nerve  pain from shingles. This medicine may be used for other purposes; ask your health care provider or pharmacist if you have questions. COMMON BRAND NAME(S): [facility] What should I tell my  care team before I take this medication? They need to know if you have any of these conditions: cancer immune system problems an unusual or allergic reaction to Zoster vaccine, other medications, foods, dyes, or preservatives pregnant or trying to get pregnant breast-feeding How should I use this medication? This vaccine is injected into a muscle. It is given by a health care provider. A copy of Vaccine Information Statements will be given before each vaccination. Be sure to read this information carefully each time. This sheet may change often. Talk to your health care provider about the use of this vaccine in children. This vaccine is not approved for use in children. Overdosage: If you think you have taken too much of this medicine contact a poison control center or emergency room at once. NOTE: This medicine is only for you. Do not share this medicine with others. What if I miss a dose? Keep appointments for follow-up (booster) doses. It is important not to miss your dose. Call your health care provider if you are unable to keep an appointment. What may interact with this medication? medicines that suppress your immune system medicines to treat cancer steroid medicines like prednisone or cortisone This list may not describe all possible interactions. Give your health care provider a list of all the medicines, herbs, non-prescription drugs, or dietary supplements you use. Also tell them if you smoke, drink alcohol, or use illegal drugs. Some items may interact with your medicine. What should I watch for while using this medication? Visit your health care provider regularly. This vaccine, like all vaccines, may not fully protect everyone. What side effects may I notice from receiving this medication? Side effects that you should report to your doctor or health care professional as soon as possible: allergic reactions (skin rash, itching or hives; swelling of the face, lips, or  tongue) trouble breathing Side effects that usually do not require medical attention (report these to your doctor or health care professional if they continue or are bothersome): chills headache fever nausea pain, redness, or irritation at site where injected tiredness vomiting This list may not describe all possible side effects. Call your doctor for medical advice about side effects. You may report side effects to FDA at 1-800-FDA-1088. Where should I keep my medication? This vaccine is only given by a health care provider. It will not be stored at home. NOTE: This sheet is a summary. It may not cover all possible information. If you have questions about this medicine, talk to your doctor, pharmacist, or health care provider.  2023 Elsevier/Gold Standard (2020-12-05 00:00:00)

## 2021-07-22 NOTE — Progress Notes (Signed)
Chief Complaint  Patient presents with   Follow-up    6 mon, fasting this morning, denies any concerns or unusual pain.   Annual Exam   Annual  1. Dm 1 with HTN controlled on norvasc 2.5 mg qd benicar 20-12.5 mg qd metformin xr 500 mg zocor 40 mg  2. Migraines controlled on topamax and imitrex  3. Right breast cancer having right axillary ttp s/p lymph node dissection x months will do [place]    Review of Systems  Constitutional:  Negative for weight loss.  HENT:  Negative for hearing loss.   Eyes:  Negative for blurred vision.  Respiratory:  Negative for shortness of breath.   Cardiovascular:  Negative for chest pain.  Gastrointestinal:  Negative for abdominal pain and blood in stool.  Genitourinary:  Negative for dysuria.  Musculoskeletal:  Negative for falls and joint pain.  Skin:  Negative for rash.  Neurological:  Negative for headaches.  Psychiatric/Behavioral:  Negative for depression.    Past Medical History:  Diagnosis Date   Cancer ([place]) 02/2019   right breast IDC   Cataracts, bilateral    tbd surgery 0539   Complication of anesthesia    Cough    Difficult intubation    TRACH AT 19   Dyspnea    W/ COUGHING   Family history of breast cancer    Family history of lung cancer    Family history of prostate cancer    Family history of skin cancer    GERD (gastroesophageal reflux disease)    Headache    High cholesterol    History of hiatal hernia    Hypertension    Hypothyroidism    Irritable bowel    Osteoarthritis    i.e right knee    PONV (postoperative nausea and vomiting)    Pre-diabetes    Rheumatoid arthritis (HCC)    Thyroid disease    Past Surgical History:  Procedure Laterality Date   ABDOMINAL HYSTERECTOMY     age 57 ovaries out   BREAST LUMPECTOMY WITH RADIOACTIVE SEED AND SENTINEL LYMPH NODE BIOPSY Right 03/28/2019   Procedure: RIGHT BREAST LUMPECTOMY WITH RADIOACTIVE SEED AND SENTINEL LYMPH NODE BIOPSY;  Surgeon: Donnie Mesa, MD;  Location:  [place];  Service: General;  Laterality: Right;  PEC BLOCK FOR POST OP PAIN   BREAST SURGERY     CATARACT EXTRACTION, BILATERAL     CHOLECYSTECTOMY     10/2019   ESOPHAGEAL DILATION     Dr. Olevia Perches 15 years from [place]     Family History  Problem Relation Age of Onset   Hypertension Mother    Thyroid disease Mother    Breast cancer Mother 74       breast dx age 38 died age 55    Prostate cancer Father 90       metastatic   Stroke Maternal Grandmother    Skin cancer Maternal Uncle        dx. in his 30s   Lung cancer Maternal Uncle        smoker, died age 67   Social History   Socioeconomic History   Marital status: Married    Spouse name: Not on file   Number of children: 2   Years of education: Not on file   Highest education level: Not on file  Occupational History   Occupation: nurse  Tobacco Use   Smoking status: Former  Packs/day: 0.25    Years: 7.00    Total pack years: 1.75    Types: Cigarettes    Quit date: 2000    Years since quitting: 23.5    Passive exposure: Past   Smokeless tobacco: Never  Vaping Use   Vaping Use: Never used  Substance and Sexual Activity   Alcohol use: Yes    Comment: occ   Drug use: Never   Sexual activity: Yes  Other Topics Concern   Not on file  Social History Narrative   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   Has a beach house    Social Determinants of Health   Financial Resource Strain: Low Risk  (10/10/2020)   Overall Financial Resource Strain (CARDIA)    Difficulty of Paying Living Expenses: Not hard at all  Food Insecurity: No Food Insecurity (10/10/2020)   Hunger Vital Sign    Worried About Running Out of Food in the Last Year: Never true    Ran Out of Food in the Last Year: Never true  Transportation Needs: No Transportation Needs (10/10/2020)   PRAPARE - Hydrologist (Medical): No    Lack of Transportation (Non-Medical): No  Physical Activity: Not  on file  Stress: No Stress Concern Present (10/10/2020)   Cheryl Knight    Feeling of Stress : Not at all  Social Connections: Unknown (10/10/2020)   Social Connection and Isolation Panel [NHANES]    Frequency of Communication with Friends and Family: Not on file    Frequency of Social Gatherings with Friends and Family: Not on file    Attends Religious Services: Not on file    Active Member of Clubs or Organizations: Not on file    Attends Archivist Meetings: Not on file    Marital Status: Married  Intimate Partner Violence: Not At Risk (10/10/2020)   Humiliation, Afraid, Rape, and Kick questionnaire    Fear of Current or Ex-Partner: No    Emotionally Abused: No    Physically Abused: No    Sexually Abused: No   Current Meds  Medication Sig   Calcium Carbonate Antacid (TUMS CHEWY BITES PO) Take 1 tablet by mouth at bedtime as needed (acid reflux).   Docusate Calcium (STOOL SOFTENER PO) Take by mouth daily.   famotidine-calcium carbonate-magnesium hydroxide (PEPCID COMPLETE) 10-800-165 MG chewable tablet Chew 1 tablet by mouth at bedtime.   glucosamine-chondroitin 500-400 MG tablet Take 2 tablets by mouth daily.    leflunomide (ARAVA) 20 MG tablet TAKE 1 TABLET(20 MG) BY MOUTH DAILY   Multiple Vitamin (MULTIVITAMIN WITH MINERALS) TABS tablet Take 1 tablet by mouth daily.   promethazine (PHENERGAN) 25 MG tablet Take 1 tablet (25 mg total) by mouth 2 (two) times daily as needed for nausea or vomiting.   RESTASIS 0.05 % ophthalmic emulsion Place 1 drop into both eyes 2 (two) times daily.    SUMAtriptan (IMITREX) 50 MG tablet Take 1 tablet (50 mg total) by mouth every 2 (two) hours as needed for migraine. Max dose 200 mg/24 hours use rec as needed only   tamoxifen (NOLVADEX) 20 MG tablet TAKE 1 TABLET(20 MG) BY MOUTH DAILY   Zoster Vaccine Adjuvanted [facility]) injection Inject 0.5 mLs into the muscle once for 1 dose.    [DISCONTINUED] amLODipine (NORVASC) 2.5 MG tablet TAKE 1 TABLET(2.5 MG) BY MOUTH DAILY   [DISCONTINUED] cyclobenzaprine (FLEXERIL) 10 MG tablet TAKE 1 TABLET(10 MG) BY MOUTH DAILY AS NEEDED   [  DISCONTINUED] esomeprazole (NEXIUM) 40 MG capsule TAKE 1 CAPSULE(40 MG) BY MOUTH DAILY   [DISCONTINUED] levothyroxine (SYNTHROID) 100 MCG tablet TAKE 1 TABLET BY MOUTH EVERY DAY 30 MINUTES BEFORE FOOD   [DISCONTINUED] metFORMIN (GLUCOPHAGE-XR) 500 MG 24 hr tablet TAKE 1 TABLET(500 MG) BY MOUTH DAILY WITH BREAKFAST   [DISCONTINUED] olmesartan-hydrochlorothiazide (BENICAR HCT) 20-12.5 MG tablet TAKE 1 TABLET BY MOUTH DAILY IN THE MORNING   [DISCONTINUED] rizatriptan (MAXALT) 10 MG tablet Take 1 tablet (10 mg total) by mouth as needed for migraine. May repeat in 2 hours if needed max dose 20 mg/24 hours limit use to <10 days per month   [DISCONTINUED] simvastatin (ZOCOR) 40 MG tablet Take 1 tablet (40 mg total) by mouth at bedtime. After 6 pm d/c pravachol 40   [DISCONTINUED] topiramate (TOPAMAX) 100 MG tablet TAKE 1 TABLET(100 MG) BY MOUTH DAILY   Allergies  Allergen Reactions   Pentazocine Lactate Other (See Comments)    Hallucinations   Demerol [Meperidine] Nausea And Vomiting   Lisinopril Cough   No results found for this or any previous visit (from the past 2160 hour(s)). Objective  Body mass index is 28.31 kg/m. Wt Readings from Last 3 Encounters:  07/22/21 186 lb 3.2 oz (84.5 kg)  03/17/21 181 lb (82.1 kg)  03/05/21 184 lb 12.8 oz (83.8 kg)   Temp Readings from Last 3 Encounters:  07/22/21 97.9 F (36.6 C) (Oral)  03/05/21 99.1 F (37.3 C) (Oral)  12/25/20 (!) 97.2 F (36.2 C) (Temporal)   BP Readings from Last 3 Encounters:  07/22/21 130/80  03/17/21 137/79  03/05/21 125/77   Pulse Readings from Last 3 Encounters:  07/22/21 70  03/17/21 98  03/05/21 88    Physical Exam Vitals and nursing note reviewed.  Constitutional:      Appearance: Normal appearance. She is  well-developed and well-groomed.  HENT:     Head: Normocephalic and atraumatic.  Eyes:     Conjunctiva/sclera: Conjunctivae normal.     Pupils: Pupils are equal, round, and reactive to light.  Cardiovascular:     Rate and Rhythm: Normal rate and regular rhythm.     Heart sounds: Normal heart sounds. No murmur heard. Pulmonary:     Effort: Pulmonary effort is normal.     Breath sounds: Normal breath sounds.  Abdominal:     General: Abdomen is flat. Bowel sounds are normal.     Tenderness: There is no abdominal tenderness.  Musculoskeletal:        General: No tenderness.  Skin:    General: Skin is warm and dry.  Neurological:     General: No focal deficit present.     Mental Status: She is alert and oriented to person, place, and time. Mental status is at baseline.     Cranial Nerves: Cranial nerves 2-12 are intact.     Motor: Motor function is intact.     Coordination: Coordination is intact.     Gait: Gait is intact.  Psychiatric:        Attention and Perception: Attention and perception normal.        Mood and Affect: Mood and affect normal.        Speech: Speech normal.        Behavior: Behavior normal. Behavior is cooperative.        Thought Content: Thought content normal.        Cognition and Memory: Cognition and memory normal.        Judgment: Judgment normal.  Assessment  Plan  Annual physical exam See below  Hyperlipidemia, unspecified hyperlipidemia type - Plan: simvastatin (ZOCOR) 40 MG tablet  Hypothyroidism, unspecified type - Plan: TSH, levothyroxine (SYNTHROID) 100 MCG tablet   Hypertension associated with diabetes ([place]) - Plan: Comprehensive metabolic panel, Lipid panel, Hemoglobin A1c, CBC with Differential/Platelet, Urinalysis, Routine w reflex microscopic, Microalbumin / creatinine urine ratio norvasc 2.5 mg qd benicar 20-12.5 mg qd metformin xr 500 mg zocor 40 mg   Vitamin D deficiency - Plan: Vitamin D (25 hydroxy)  Low back pain,  unspecified back pain laterality, unspecified chronicity, unspecified whether sciatica present - Plan: cyclobenzaprine (FLEXERIL) 10 MG tablet  Intractable migraine with status migrainosus, unspecified migraine type  History of migraine headaches controlled - Plan: topiramate (TOPAMAX) 100 MG tablet  Pain in right axilla  s/p right breast cancer- Plan: US BREAST LTD UNI RIGHT INC AXILLA   HM Flu shot  do in 1 week Tdap utd due in 2026  prevnar utd 10/03/19  pna 20 given today   shingrix rx today covid 19 vaccine 3/3 pfizer consider booster    rheumatology Dr. Estanislado Pandy labd 04/2020  Skin dermatology Dr. Ubaldo Glassing est saw summer 2020 f/u yearly h/o [facility] chest appt scheduled as of 08/28/19     [facility]amm[facility] 02/22/19 abnormal and repeat 03/09/19 right breast mass right breast rec biopsy did she get this Dr. Burr Medico onc appt 03/06/20 Mammogram 03/11/21 negative [facility]    Pap s/p total hysterectomy due to uterine prolapse h/o abnormal pap remotely ovaries removed    Colonoscopy will refer Dr. Alice Reichert had and EGD 08/14/19 with bxs neg [facility] colonoscopy 5 years   dexa get [facility] osteopenia 02/22/19 with mammo  03/10/20 [facility] mammogram negatvie      Former smoker quit 1 year ago in 2019/2020 but ocass. Light smoking 2x per year socially  Eyes doing after cataract surgery 02/13/19 Dr. Herbert Deaner  Surgery note 03/21/19 Dr. Georgette Dover    INVASIVE DUCTAL CARCINOMA OF RIGHT BREAST, STAGE 1 (C50.911) Impression: RLOQ 5 mm 5 cmfn IDC, ER/PR +, Her2 - 04/24/19 [facility] f/u Dr. Georgette Dover 03/28/19 right radioactive seed lumpectomy and sentinel ln bx invasive ductal ca 0.9 cm with some surrounding DCIS margins negative 2 nodes negative begin radiation next week with HT after radiation  F/u in 3 months On Tamoxifen as of 08/16/19  appt Dr. Darnell Level lung 09/18/19 to consider CT chest      rec healthy diet and exercise   Provider: Dr. Olivia Mackie McLean-Scocuzza-Internal Medicine

## 2021-07-23 ENCOUNTER — Encounter: Payer: Self-pay | Admitting: Internal Medicine

## 2021-07-23 LAB — MICROALBUMIN / CREATININE URINE RATIO
Creatinine, Urine: 157 mg/dL (ref 20–275)
Microalb Creat Ratio: 7 mcg/mg creat (ref <30)
Microalb, Ur: 1.1 mg/dL

## 2021-07-23 LAB — URINALYSIS, ROUTINE W REFLEX MICROSCOPIC
Bilirubin Urine: NEGATIVE
Glucose, UA: NEGATIVE
Hgb urine dipstick: NEGATIVE
Ketones, ur: NEGATIVE
Leukocytes,Ua: NEGATIVE
Nitrite: NEGATIVE
Protein, ur: NEGATIVE
Specific Gravity, Urine: 1.019 (ref 1.001–1.035)
pH: 6 (ref 5.0–8.0)

## 2021-07-29 ENCOUNTER — Encounter: Payer: Self-pay | Admitting: Internal Medicine

## 2021-07-29 ENCOUNTER — Other Ambulatory Visit: Payer: Self-pay | Admitting: Internal Medicine

## 2021-07-29 DIAGNOSIS — B3731 Acute candidiasis of vulva and vagina: Secondary | ICD-10-CM

## 2021-07-29 DIAGNOSIS — B9689 Other specified bacterial agents as the cause of diseases classified elsewhere: Secondary | ICD-10-CM

## 2021-07-29 MED ORDER — METRONIDAZOLE 500 MG PO TABS: 500.0000 mg | ORAL_TABLET | Freq: Two times a day (BID) | ORAL | 0 refills | Status: DC

## 2021-07-29 MED ORDER — FLUCONAZOLE 150 MG PO TABS: 150.0000 mg | ORAL_TABLET | Freq: Once | ORAL | 0 refills | Status: AC

## 2021-07-31 ENCOUNTER — Other Ambulatory Visit: Payer: Self-pay | Admitting: Physician Assistant

## 2021-07-31 DIAGNOSIS — M0579 Rheumatoid arthritis with rheumatoid factor of multiple sites without organ or systems involvement: Secondary | ICD-10-CM

## 2021-07-31 NOTE — Progress Notes (Signed)
Office Visit Note  Patient: Cheryl Knight             Date of Birth: [birth year]           MRN: [id number]             PCP: McLean-Scocuzza, Nino Glow, MD Referring: Orland Mustard * Visit Date: 08/14/2021 Occupation: '@GUAROCC'$ @  Subjective:  Medication monitoring   History of Present Illness: Cheryl Knight is a 66 y.o. female with history of seropositive rheumatoid arthritis, ILD, and osteoarthritis.  She is taking arava 20 mg 1 tablet by mouth daily.  She is tolerating Arava without any side effects and has not missed any doses recently.  She denies any signs or symptoms of a rheumatoid arthritis flare.  She states that she experiences increased pain and stiffness in both knee joints with weather changes but overall has been doing well.  She denies any joint swelling at this time.  She takes Excedrin very sparingly for symptomatic relief.  She denies any new medical conditions.  She states she had a recent yeast infection which resolved but has not had any other recent or recurrent infections.    Activities of Daily Living:  Patient reports morning stiffness for 5-10 minutes.   Patient Denies nocturnal pain.  Difficulty dressing/grooming: Denies Difficulty climbing stairs: Reports Difficulty getting out of chair: Denies Difficulty using hands for taps, buttons, cutlery, and/or writing: Reports  Review of Systems  Constitutional:  Negative for fatigue.  HENT:  Positive for mouth dryness. Negative for mouth sores.   Eyes:  Positive for dryness.  Respiratory:  Negative for shortness of breath.   Cardiovascular:  Negative for chest pain and palpitations.  Gastrointestinal:  Positive for constipation and diarrhea. Negative for blood in stool.  Endocrine: Negative for increased urination.  Genitourinary:  Negative for involuntary urination.  Musculoskeletal:  Positive for morning stiffness. Negative for joint pain, joint pain, joint swelling, myalgias, muscle weakness,  muscle tenderness and myalgias.  Skin:  Negative for color change, rash, hair loss and sensitivity to sunlight.  Allergic/Immunologic: Negative for susceptible to infections.  Neurological:  Positive for headaches. Negative for dizziness.  Hematological:  Negative for swollen glands.  Psychiatric/Behavioral:  Negative for depressed mood and sleep disturbance. The patient is not nervous/anxious.     PMFS History:  Patient Active Problem List   Diagnosis Date Noted   Umbilical hernia without obstruction and without gangrene 12/25/2020   Influenza 12/10/2020   Fatty liver 06/24/2020   Annual physical exam 06/24/2020   Elevated liver enzymes 06/24/2020   Hypophosphatemia 02/28/2020   Acute cholecystitis 11/01/2019   Pulmonary fibrosis ([place]) 10/01/2019   Pulmonary nodules 10/01/2019   Bronchiectasis without complication ([place]) 85/27/7824   Aortic atherosclerosis ([place]) 10/01/2019   Hypertension associated with diabetes ([place]) 10/01/2019   Paresthesia of upper extremity 10/01/2019   Hiatal hernia 08/16/2019   Esophagitis 08/16/2019   Persistent cough 08/16/2019   Dry eye syndrome 08/05/2019   Glaucoma 08/05/2019   Hypertensive retinopathy 08/05/2019   Arteriosclerosis 08/05/2019   Pterygium 08/05/2019   Ocular rosacea 08/05/2019   Genetic testing 05/02/2019   Family history of breast cancer    Family history of prostate cancer    Family history of skin cancer    Family history of lung cancer    Malignant neoplasm of lower-outer quadrant of right breast of female, estrogen receptor positive ([place]) 03/19/2019   Type 2 diabetes mellitus with hyperglycemia, without long-term current use of insulin ([place]) 02/08/2019  Allergic rhinitis 02/08/2019   Osteoarthritis    Primary osteoarthritis involving multiple joints 11/15/2017   Moderate asthma with acute exacerbation 05/03/2017   Routine general medical examination at a health care facility 02/08/2017   Primary osteoarthritis of both feet  02/10/2016   Hx of migraine headaches 02/07/2016   History of hypothyroidism 02/07/2016   History of gastroesophageal reflux (GERD) 02/07/2016   Obesity (BMI 30-39.9) 12/30/2015   Basal cell carcinoma of chest wall 10/05/2011   Hypertension 09/01/2010   Hyperlipidemia 02/27/2008   Hypothyroidism 02/21/2007   Migraine 02/21/2007   VAGINITIS, ATROPHIC 02/21/2007   Psoriasis 02/21/2007   Sicca syndrome, unspecified 02/21/2007   GERD 06/29/2006   Rheumatoid arthritis ([place]) 06/29/2006    Past Medical History:  Diagnosis Date   Cancer () 02/2019   right breast IDC   Cataracts, bilateral    tbd surgery 6440   Complication of anesthesia    Cough    Difficult intubation    TRACH AT 19   Dry eye syndrome    Dyspnea    W/ COUGHING   Family history of breast cancer    Family history of lung cancer    Family history of prostate cancer    Family history of skin cancer    GERD (gastroesophageal reflux disease)    Glaucoma    stable glaucoma suspect 01/19/21 [facility]   Headache    High cholesterol    History of hiatal hernia    Hypertension    Hypothyroidism    Irritable bowel    Osteoarthritis    i.e right knee    PONV (postoperative nausea and vomiting)    Pre-diabetes    Retinopathy    htn 01/19/21 [facility]   Rheumatoid arthritis ([place])    Thyroid disease     Family History  Problem Relation Age of Onset   Hypertension Mother    Thyroid disease Mother    Breast cancer Mother 20       breast dx age 79 died age 48    Prostate cancer Father 60       metastatic   Stroke Maternal Grandmother    Skin cancer Maternal Uncle        dx. in his 10s   Lung cancer Maternal Uncle        smoker, died age 62   Past Surgical History:  Procedure Laterality Date   ABDOMINAL HYSTERECTOMY     age 33 ovaries out   BREAST LUMPECTOMY WITH RADIOACTIVE SEED AND SENTINEL LYMPH NODE BIOPSY Right 03/28/2019   Procedure: RIGHT BREAST LUMPECTOMY WITH RADIOACTIVE SEED AND SENTINEL LYMPH  NODE BIOPSY;  Surgeon: Donnie Mesa, MD;  Location: [facility];  Service: General;  Laterality: Right;  PEC BLOCK FOR POST OP PAIN   BREAST SURGERY     CATARACT EXTRACTION, BILATERAL     CHOLECYSTECTOMY     10/2019   ESOPHAGEAL DILATION     Dr. 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    Immunization History  Administered Date(s) Administered   Fluad Quad(high Dose 65+) 03/11/2020   H1N1 02/27/2008   Influenza-Unspecified 11/18/2017   PFIZER(Purple Top)SARS-COV-2 Vaccination 03/02/2019, 03/29/2019, 01/01/2020   PNEUMOCOCCAL CONJUGATE-20 07/22/2021   Pfizer Covid-19 Vaccine Bivalent Booster 65yr & up 02/02/2021   Pneumococcal Conjugate-13 10/03/2019   Td  01/19/2004   Tdap 12/17/2014     Objective: Vital Signs: BP 137/85 (BP Location: Left Arm, Patient Position: Sitting, Cuff Size: Normal)   Pulse 97   Ht '5\' 8"'$  (1.727 m)   Wt 187 lb 3.2 oz (84.9 kg)   BMI 28.46 kg/m    Physical Exam Vitals and nursing note reviewed.  Constitutional:      Appearance: She is well-developed.  HENT:     Head: Normocephalic and atraumatic.  Eyes:     Conjunctiva/sclera: Conjunctivae normal.  Cardiovascular:     Rate and Rhythm: Normal rate and regular rhythm.     Heart sounds: Normal heart sounds.  Pulmonary:     Effort: Pulmonary effort is normal.     Breath sounds: Normal breath sounds.  Abdominal:     General: Bowel sounds are normal.     Palpations: Abdomen is soft.  Musculoskeletal:     Cervical back: Normal range of motion.  Skin:    General: Skin is warm and dry.     Capillary Refill: Capillary refill takes less than 2 seconds.  Neurological:     Mental Status: She is alert and oriented to person, place, and time.  Psychiatric:        Behavior: Behavior normal.      Musculoskeletal Exam: C-spine, thoracic spine, and lumbar spine good ROM.   Shoulder joints, elbow joints, wrist joints, MCPs, PIPs, and DIPs good ROM with no synovitis.  Complete fist formation bilaterally. PIP and DIP thickening consistent with osteoarthritis of both hands.  Hip joints have good ROM with no groin pain.  Knee joints have good ROM with crepitus.  No warmth or effusion of knee joints.  No tenderness or swelling of ankle joints. No tenderness over MTP joints.   CDAI Exam: CDAI Score: 0.6  Patient Global: 3 mm; Provider Global: 3 mm Swollen: 0 ; Tender: 0  Joint Exam 08/14/2021   No joint exam has been documented for this visit   There is currently no information documented on the homunculus. Go to the Rheumatology activity and complete the homunculus joint exam.  Investigation: No additional findings.  Imaging: No results found.  Recent Labs: Lab Results  Component Value Date   WBC 4.0 07/22/2021   HGB 13.3 07/22/2021   PLT 258.0 07/22/2021   NA 143 07/22/2021   K 4.2 07/22/2021   CL 106 07/22/2021   CO2 27 07/22/2021   GLUCOSE 115 (H) 07/22/2021   BUN 14 07/22/2021   CREATININE 0.62 07/22/2021   BILITOT 0.4 07/22/2021   ALKPHOS 33 (L) 07/22/2021   AST 19 07/22/2021   ALT 13 07/22/2021   PROT 6.7 07/22/2021   ALBUMIN 4.4 07/22/2021   CALCIUM 9.9 07/22/2021   GFRAA 108 04/22/2020   QFTBGOLDPLUS NEGATIVE 04/05/2019    Speciality Comments: No specialty comments available.  Procedures:  No procedures performed Allergies: Pentazocine lactate, Demerol [meperidine], and Lisinopril   Assessment / Plan:     Visit Diagnoses: Rheumatoid arthritis involving multiple sites with positive rheumatoid factor ([place]) - +RF: She has no joint tenderness or synovitis on examination today.  She has not had any signs or symptoms of a rheumatoid arthritis flare.  She is clinically been doing well taking Arava 20 mg 1 tablet by mouth daily.  She is tolerating Arava without any side effects or recurrent infections.  She experiences occasional discomfort  in both knee joints which she attributes to weather changes.  On examination today she has good range of motion  of both knees with crepitus but no warmth or effusion.  Discussed the importance of joint protection and muscle strengthening.  She was advised to notify us if she would like to reapply for Visco gel injections in the future.  She will remain on [place] as monotherapy.  She was advised to notify us if she develops increased joint pain or joint swelling.  She will follow-up in the office in 5 months or sooner if needed.  High risk medication use - Arava 20 mg 1 tablet by mouth daily.  (Previously on Methotrexate 0.8 ml every 7 days and folic acid 1 mg 2 tablets daily.)  - Plan: CBC with Differential/Platelet, COMPLETE METABOLIC PANEL WITH GFR CBC and CMP updated on 07/22/21. She will be due to update lab work in October and every 3 months.  Standing orders for CBC and CMP will be placed today.  She had a recent yeast infection which was treated by her PCP.  She has not had any other recent or recurrent infections.  Discussed the importance of holding arava if she develops signs or symptoms of an infection and to resume once the infection has completely cleared.  No new medical conditions.   ILD (interstitial lung disease) ([place]) - Mild pulmonary fibrosis in an early "indeterminate for UIP" pattern.  Diagnosed on the basis of high-resolution CT. On 09/26/19.  She is currently asymptomatic.  Discussed that ACR recommends yearly high-resolution chest CT and/or pulmonary function testing.  She was encouraged to schedule an appointment with Dr. Patsey Berthold.  Sicca syndrome [facility]): She continues to have chronic sicca symptoms which have been stable overall.  Her symptoms were previously adequately controlled taking pilocarpine but her insurance will no longer cover pilocarpine.  She continues to use Restasis for dry eyes.  Primary osteoarthritis of both knees: She has good range of motion of both knee joints on  examination today with crepitus bilaterally.  No warmth or effusion noted.  She experiences increased pain and stiffness in both knees with weather changes.  She previously had Visco gel injections which were helpful.  She was advised to notify us if she would like to reapply for Visco gel injections in the future.  Primary osteoarthritis of both feet: She is not experiencing any discomfort in her feet at this time.  She has good range of motion of both ankle joints with no tenderness or synovitis.  No tenderness over MTP joints.  Psoriasis: No active psoriasis at this time.   Other medical conditions are listed as follows:   History of hyperlipidemia  History of hypothyroidism  History of asthma - Diagnosed with asthma by Dr. Patsey Berthold.  History of migraine  Malignant neoplasm of lower-outer quadrant of right breast of female, estrogen receptor positive ([place])  Orders: Orders Placed This Encounter  Procedures   CBC with Differential/Platelet   COMPLETE METABOLIC PANEL WITH GFR   No orders of the defined types were placed in this encounter.     Follow-Up Instructions: Return in about 5 months (around 01/14/2022) for Rheumatoid arthritis, ILD, Osteoarthritis.   Ofilia Neas, PA-C  Note - This record has been created using Dragon software.  Chart creation errors have been sought, but may not always  have been located. Such creation errors do not reflect on  the standard of medical care.

## 2021-07-31 NOTE — Telephone Encounter (Signed)
Next Visit: 08/14/2021   Last Visit: 03/17/2021   Last Fill: 05/07/2021   DX: Rheumatoid arthritis involving multiple sites with positive rheumatoid factor    Current Dose per office note 03/17/2021: Arava 20 mg 1 tablet by mouth daily.    Labs: 07/22/2021 Neutrophils Relative % 34.0, Lymphocytes Relative 46.1, Monocytes Relative 13.3, Eosinophils Relative 5.5, Neutro Abs 1.3, Glucose 115, Alk. Phos 33   Okay to refill Arava?

## 2021-08-03 DIAGNOSIS — N644 Mastodynia: Secondary | ICD-10-CM | POA: Diagnosis not present

## 2021-08-03 DIAGNOSIS — Z853 Personal history of malignant neoplasm of breast: Secondary | ICD-10-CM | POA: Diagnosis not present

## 2021-08-03 LAB — HM MAMMOGRAPHY

## 2021-08-10 ENCOUNTER — Other Ambulatory Visit: Payer: Self-pay | Admitting: Internal Medicine

## 2021-08-10 DIAGNOSIS — M545 Low back pain, unspecified: Secondary | ICD-10-CM

## 2021-08-11 ENCOUNTER — Encounter: Payer: Self-pay | Admitting: Internal Medicine

## 2021-08-14 ENCOUNTER — Encounter: Payer: Self-pay | Admitting: Physician Assistant

## 2021-08-14 ENCOUNTER — Ambulatory Visit: Payer: Medicare HMO | Admitting: Physician Assistant

## 2021-08-14 VITALS — BP 137/85 | HR 97 | Ht 68.0 in | Wt 187.2 lb

## 2021-08-14 DIAGNOSIS — Z8639 Personal history of other endocrine, nutritional and metabolic disease: Secondary | ICD-10-CM

## 2021-08-14 DIAGNOSIS — L409 Psoriasis, unspecified: Secondary | ICD-10-CM

## 2021-08-14 DIAGNOSIS — Z8669 Personal history of other diseases of the nervous system and sense organs: Secondary | ICD-10-CM | POA: Diagnosis not present

## 2021-08-14 DIAGNOSIS — M19071 Primary osteoarthritis, right ankle and foot: Secondary | ICD-10-CM | POA: Diagnosis not present

## 2021-08-14 DIAGNOSIS — M0579 Rheumatoid arthritis with rheumatoid factor of multiple sites without organ or systems involvement: Secondary | ICD-10-CM | POA: Diagnosis not present

## 2021-08-14 DIAGNOSIS — J849 Interstitial pulmonary disease, unspecified: Secondary | ICD-10-CM | POA: Diagnosis not present

## 2021-08-14 DIAGNOSIS — C50511 Malignant neoplasm of lower-outer quadrant of right female breast: Secondary | ICD-10-CM

## 2021-08-14 DIAGNOSIS — M19072 Primary osteoarthritis, left ankle and foot: Secondary | ICD-10-CM

## 2021-08-14 DIAGNOSIS — Z79899 Other long term (current) drug therapy: Secondary | ICD-10-CM

## 2021-08-14 DIAGNOSIS — M3500 Sicca syndrome, unspecified: Secondary | ICD-10-CM | POA: Diagnosis not present

## 2021-08-14 DIAGNOSIS — M170 Bilateral primary osteoarthritis of knee: Secondary | ICD-10-CM

## 2021-08-14 DIAGNOSIS — Z8709 Personal history of other diseases of the respiratory system: Secondary | ICD-10-CM

## 2021-08-14 DIAGNOSIS — Z170 Estrogen receptor positive status [ER+]: Secondary | ICD-10-CM

## 2021-08-14 NOTE — Patient Instructions (Signed)
Standing Labs We placed an order today for your standing lab work.   Please have your standing labs drawn in October and every 3 months   If possible, please have your labs drawn 2 weeks prior to your appointment so that the provider can discuss your results at your appointment.  Please note that you may see your imaging and lab results in MyChart before we have reviewed them. We may be awaiting multiple results to interpret others before contacting you. Please allow our office up to 72 hours to thoroughly review all of the results before contacting the office for clarification of your results.  We have open lab daily: Monday through Thursday from 1:30-4:30 PM and Friday from 1:30-4:00 PM at the office of Dr. Shaili Deveshwar, [facility].   Please be advised, all patients with office appointments requiring lab work will take precedent over walk-in lab work.  If possible, please come for your lab work on Monday and Friday afternoons, as you may experience shorter wait times. The office is located at [address], [address], , [place] [address] No appointment is necessary.   Labs are drawn by [facility]. Please bring your co-pay at the time of your lab draw.  You may receive a bill from [facility] for your lab work.  Please note if you are on Hydroxychloroquine and and an order has been placed for a Hydroxychloroquine level, you will need to have it drawn 4 hours or more after your last dose.  If you wish to have your labs drawn at another location, please call the office 24 hours in advance to send orders.  If you have any questions regarding directions or hours of operation,  please call [phone].   As a reminder, please drink plenty of water prior to coming for your lab work. Thanks!  If you have signs or symptoms of an infection or start antibiotics: First, call your PCP for workup of your infection. Hold your medication through the infection, until you complete your  antibiotics, and until symptoms resolve if you take the following: Injectable medication (Actemra, Benlysta, Cimzia, Cosentyx, Enbrel, Humira, Kevzara, Orencia, Remicade, Simponi, Stelara, Taltz, Tremfya) Methotrexate Leflunomide (Arava) Mycophenolate (Cellcept) Xeljanz, Olumiant, or Rinvoq   

## 2021-08-18 NOTE — Progress Notes (Deleted)
Entered in error - listed on lab results.

## 2021-08-21 ENCOUNTER — Other Ambulatory Visit: Payer: Self-pay | Admitting: Internal Medicine

## 2021-08-21 DIAGNOSIS — B370 Candidal stomatitis: Secondary | ICD-10-CM

## 2021-08-21 MED ORDER — NYSTATIN 100000 UNIT/ML MT SUSP: 5.0000 mL | Freq: Four times a day (QID) | OROMUCOSAL | 0 refills | Status: DC

## 2021-08-21 MED ORDER — FLUCONAZOLE 150 MG PO TABS: 150.0000 mg | ORAL_TABLET | Freq: Every day | ORAL | 0 refills | Status: DC

## 2021-09-01 ENCOUNTER — Other Ambulatory Visit: Payer: Self-pay | Admitting: Nurse Practitioner

## 2021-10-08 DIAGNOSIS — H04123 Dry eye syndrome of bilateral lacrimal glands: Secondary | ICD-10-CM | POA: Diagnosis not present

## 2021-10-26 ENCOUNTER — Ambulatory Visit (INDEPENDENT_AMBULATORY_CARE_PROVIDER_SITE_OTHER): Payer: Medicare HMO

## 2021-10-26 VITALS — Ht 68.0 in | Wt 187.0 lb

## 2021-10-26 DIAGNOSIS — Z0000 Encounter for general adult medical examination without abnormal findings: Secondary | ICD-10-CM | POA: Diagnosis not present

## 2021-10-26 NOTE — Patient Instructions (Addendum)
[name] , Thank you for taking time to come for your Medicare Wellness Visit. I appreciate your ongoing commitment to your health goals. Please review the following plan we discussed and let me know if I can assist you in the future.   These are the goals we discussed:  Goals      Follow up with Primary Care Provider     As needed Maintain healthy weight         This is a list of the screening recommended for you and due dates:  Health Maintenance  Topic Date Due   COVID-19 Vaccine (5 - Pfizer risk series) 11/11/2021*   Zoster (Shingles) Vaccine (1 of 2) 12/18/2021*   Flu Shot  04/18/2022*   Complete foot exam   12/25/2021   Hemoglobin A1C  01/22/2022   Eye exam for diabetics  02/05/2022   Yearly kidney function blood test for diabetes  07/23/2022   Yearly kidney health urinalysis for diabetes  07/23/2022   Mammogram  08/04/2023   Tetanus Vaccine  12/16/2024   Colon Cancer Screening  08/13/2029   Pneumonia Vaccine  Completed   DEXA scan (bone density measurement)  Completed   Hepatitis C Screening: USPSTF Recommendation to screen - Ages 70-79 yo.  Completed   HPV Vaccine  Aged Out  *Topic was postponed. The date shown is not the original due date.    Advanced directives: End of life planning; Advanced aging; Advanced directives discussed.  No HCPOA/Living Will.  Additional information declined at this time.  Conditions/risks identified: none new  Next appointment: Follow up in one year for your annual wellness visit    Preventive Care 65 Years and Older, Female Preventive care refers to lifestyle choices and visits with your health care provider that can promote health and wellness. What does preventive care include? A yearly physical exam. This is also called an annual well check. Dental exams once or twice a year. Routine eye exams. Ask your health care provider how often you should have your eyes checked. Personal lifestyle choices, including: Daily care of  your teeth and gums. Regular physical activity. Eating a healthy diet. Avoiding tobacco and drug use. Limiting alcohol use. Practicing safe sex. Taking low-dose aspirin every day. Taking vitamin and mineral supplements as recommended by your health care provider. What happens during an annual well check? The services and screenings done by your health care provider during your annual well check will depend on your age, overall health, lifestyle risk factors, and family history of disease. Counseling  Your health care provider may ask you questions about your: Alcohol use. Tobacco use. Drug use. Emotional well-being. Home and relationship well-being. Sexual activity. Eating habits. History of falls. Memory and ability to understand (cognition). Work and work Statistician. Reproductive health. Screening  You may have the following tests or measurements: Height, weight, and BMI. Blood pressure. Lipid and cholesterol levels. These may be checked every 5 years, or more frequently if you are over 4 years old. Skin check. Lung cancer screening. You may have this screening every year starting at age 41 if you have a 30-pack-year history of smoking and currently smoke or have quit within the past 15 years. Fecal occult blood test (FOBT) of the stool. You may have this test every year starting at age 87. Flexible sigmoidoscopy or colonoscopy. You may have a sigmoidoscopy every 5 years or a colonoscopy every 10 years starting at age 27. Hepatitis C blood test. Hepatitis B blood test. Sexually transmitted disease (  STD) testing. Diabetes screening. This is done by checking your blood sugar (glucose) after you have not eaten for a while (fasting). You may have this done every 1-3 years. Bone density scan. This is done to screen for osteoporosis. You may have this done starting at age 43. Mammogram. This may be done every 1-2 years. Talk to your health care provider about how often you should  have regular mammograms. Talk with your health care provider about your test results, treatment options, and if necessary, the need for more tests. Vaccines  Your health care provider may recommend certain vaccines, such as: Influenza vaccine. This is recommended every year. Tetanus, diphtheria, and acellular pertussis (Tdap, Td) vaccine. You may need a Td booster every 10 years. Zoster vaccine. You may need this after age 29. Pneumococcal 13-valent conjugate (PCV13) vaccine. One dose is recommended after age 61. Pneumococcal polysaccharide (PPSV23) vaccine. One dose is recommended after age 46. Talk to your health care provider about which screenings and vaccines you need and how often you need them. This information is not intended to replace advice given to you by your health care provider. Make sure you discuss any questions you have with your health care provider. Document Released: 01/31/2015 Document Revised: 09/24/2015 Document Reviewed: 11/05/2014 Elsevier Interactive Patient Education  2017 [place] Prevention in the Home Falls can cause injuries. They can happen to people of all ages. There are many things you can do to make your home safe and to help prevent falls. What can I do on the outside of my home? Regularly fix the edges of walkways and driveways and fix any cracks. Remove anything that might make you trip as you walk through a door, such as a raised step or threshold. Trim any bushes or trees on the path to your home. Use bright outdoor lighting. Clear any walking paths of anything that might make someone trip, such as rocks or tools. Regularly check to see if handrails are loose or broken. Make sure that both sides of any steps have handrails. Any raised decks and porches should have guardrails on the edges. Have any leaves, snow, or ice cleared regularly. Use sand or salt on walking paths during winter. Clean up any spills in your garage right away. This  includes oil or grease spills. What can I do in the bathroom? Use night lights. Install grab bars by the toilet and in the tub and shower. Do not use towel bars as grab bars. Use non-skid mats or decals in the tub or shower. If you need to sit down in the shower, use a plastic, non-slip stool. Keep the floor dry. Clean up any water that spills on the floor as soon as it happens. Remove soap buildup in the tub or shower regularly. Attach bath mats securely with double-sided non-slip rug tape. Do not have throw rugs and other things on the floor that can make you trip. What can I do in the bedroom? Use night lights. Make sure that you have a light by your bed that is easy to reach. Do not use any sheets or blankets that are too big for your bed. They should not hang down onto the floor. Have a firm chair that has side arms. You can use this for support while you get dressed. Do not have throw rugs and other things on the floor that can make you trip. What can I do in the kitchen? Clean up any spills right away. Avoid walking on  wet floors. Keep items that you use a lot in easy-to-reach places. If you need to reach something above you, use a strong step stool that has a grab bar. Keep electrical cords out of the way. Do not use floor polish or wax that makes floors slippery. If you must use wax, use non-skid floor wax. Do not have throw rugs and other things on the floor that can make you trip. What can I do with my stairs? Do not leave any items on the stairs. Make sure that there are handrails on both sides of the stairs and use them. Fix handrails that are broken or loose. Make sure that handrails are as long as the stairways. Check any carpeting to make sure that it is firmly attached to the stairs. Fix any carpet that is loose or worn. Avoid having throw rugs at the top or bottom of the stairs. If you do have throw rugs, attach them to the floor with carpet tape. Make sure that you  have a light switch at the top of the stairs and the bottom of the stairs. If you do not have them, ask someone to add them for you. What else can I do to help prevent falls? Wear shoes that: Do not have high heels. Have rubber bottoms. Are comfortable and fit you well. Are closed at the toe. Do not wear sandals. If you use a stepladder: Make sure that it is fully opened. Do not climb a closed stepladder. Make sure that both sides of the stepladder are locked into place. Ask someone to hold it for you, if possible. Clearly mark and make sure that you can see: Any grab bars or handrails. First and last steps. Where the edge of each step is. Use tools that help you move around (mobility aids) if they are needed. These include: Canes. Walkers. Scooters. Crutches. Turn on the lights when you go into a dark area. Replace any light bulbs as soon as they burn out. Set up your furniture so you have a clear path. Avoid moving your furniture around. If any of your floors are uneven, fix them. If there are any pets around you, be aware of where they are. Review your medicines with your doctor. Some medicines can make you feel dizzy. This can increase your chance of falling. Ask your doctor what other things that you can do to help prevent falls. This information is not intended to replace advice given to you by your health care provider. Make sure you discuss any questions you have with your health care provider. Document Released: 10/31/2008 Document Revised: 06/12/2015 Document Reviewed: 02/08/2014 Elsevier Interactive Patient Education  2017 [employer].

## 2021-10-26 NOTE — Progress Notes (Signed)
Subjective:   Cheryl Knight is a 66 y.o. female who presents for Medicare Annual/Subsequent preventive examination.  Review of Systems    No ROS.  Medicare Wellness Virtual Visit.  Visual/audio telehealth visit, UTA vital signs.   See social history for additional risk factors.   Cardiac Risk Factors include: advanced age (>40mn, >>10women)     Objective:    Today's Vitals   10/26/21 0934  Weight: 187 lb (84.8 kg)  Height: '5\' 8"'$  (1.727 m)   Body mass index is 28.43 kg/m.     10/26/2021    9:39 AM 10/10/2020    3:13 PM 11/02/2019    9:04 AM 11/01/2019    1:55 PM 10/29/2019    9:58 PM 10/11/2019   11:08 AM 10/10/2019    1:42 PM  Advanced Directives  Does Patient Have a Medical Advance Directive? No No No No No No No  Would patient like information on creating a medical advance directive? No - Patient declined No - Patient declined No - Patient declined No - Patient declined No - Patient declined No - Patient declined Yes (MAU/Ambulatory/Procedural Areas - Information given)    Current Medications (verified) Outpatient Encounter Medications as of 10/26/2021  Medication Sig   amLODipine (NORVASC) 2.5 MG tablet TAKE 1 TABLET(2.5 MG) BY MOUTH DAILY   Calcium Carbonate Antacid (TUMS CHEWY BITES PO) Take 1 tablet by mouth at bedtime as needed (acid reflux).   CALTRATE 600+D3 SOFT 600-20 MG-MCG CHEW daily.   cyclobenzaprine (FLEXERIL) 10 MG tablet TAKE 1 TABLET(10 MG) BY MOUTH DAILY AS NEEDED (Patient taking differently: Take 10 mg by mouth at bedtime. TAKE 1 TABLET(10 MG) BY MOUTH DAILY AS NEEDED)   Docusate Calcium (STOOL SOFTENER PO) Take by mouth daily.   esomeprazole (NEXIUM) 40 MG capsule TAKE 1 CAPSULE(40 MG) BY MOUTH DAILY   famotidine-calcium carbonate-magnesium hydroxide (PEPCID COMPLETE) 10-800-165 MG chewable tablet Chew 1 tablet by mouth at bedtime.   fluconazole (DIFLUCAN) 150 MG tablet Take 1 tablet (150 mg total) by mouth daily. X 4 -7 days thrush    glucosamine-chondroitin 500-400 MG tablet Take 2 tablets by mouth daily.    leflunomide (ARAVA) 20 MG tablet TAKE 1 TABLET(20 MG) BY MOUTH DAILY   levothyroxine (SYNTHROID) 100 MCG tablet TAKE 1 TABLET BY MOUTH EVERY DAY 30 MINUTES BEFORE FOOD (Patient not taking: Reported on 08/14/2021)   levothyroxine (SYNTHROID) 75 MCG tablet Take by mouth.   lisinopril-hydrochlorothiazide (ZESTORETIC) 20-12.5 MG tablet Take by mouth daily.   metFORMIN (GLUCOPHAGE-XR) 500 MG 24 hr tablet TAKE 1 TABLET(500 MG) BY MOUTH DAILY WITH BREAKFAST   metroNIDAZOLE (FLAGYL) 500 MG tablet Take 1 tablet (500 mg total) by mouth 2 (two) times daily. With food (Patient not taking: Reported on 08/14/2021)   Multiple Vitamin (MULTIVITAMIN WITH MINERALS) TABS tablet Take 1 tablet by mouth daily.   nystatin (MYCOSTATIN) 100000 UNIT/ML suspension Take 5 mLs (500,000 Units total) by mouth 4 (four) times daily. Swish and swallow   olmesartan-hydrochlorothiazide (BENICAR HCT) 20-12.5 MG tablet Take 1 tablet by mouth every morning.   Omega-3 Fatty Acids (FISH OIL) 1000 MG CAPS Take by mouth.   promethazine (PHENERGAN) 25 MG tablet Take 1 tablet (25 mg total) by mouth 2 (two) times daily as needed for nausea or vomiting.   RESTASIS 0.05 % ophthalmic emulsion Place 1 drop into both eyes 2 (two) times daily.    simvastatin (ZOCOR) 40 MG tablet Take 1 tablet (40 mg total) by mouth at bedtime. After 6  pm d/c pravachol 40   SUMAtriptan (IMITREX) 50 MG tablet Take 1 tablet (50 mg total) by mouth every 2 (two) hours as needed for migraine. Max dose 200 mg/24 hours use rec as needed only   tamoxifen (NOLVADEX) 20 MG tablet TAKE 1 TABLET(20 MG) BY MOUTH DAILY   topiramate (TOPAMAX) 100 MG tablet TAKE 1 TABLET(100 MG) BY MOUTH DAILY   No facility-administered encounter medications on file as of 10/26/2021.    Allergies (verified) Pentazocine lactate, Demerol [meperidine], and Lisinopril   History: Past Medical History:  Diagnosis Date    Cancer ([place]) 02/2019   right breast IDC   Cataracts, bilateral    tbd surgery 7106   Complication of anesthesia    Cough    Difficult intubation    TRACH AT 19   Dry eye syndrome    Dyspnea    W/ COUGHING   Family history of breast cancer    Family history of lung cancer    Family history of prostate cancer    Family history of skin cancer    GERD (gastroesophageal reflux disease)    Glaucoma    stable glaucoma suspect 01/19/21 [facility]   Headache    High cholesterol    History of hiatal hernia    Hypertension    Hypothyroidism    Irritable bowel    Osteoarthritis    i.e right knee    PONV (postoperative nausea and vomiting)    Pre-diabetes    Retinopathy    htn 01/19/21 [facility]   Rheumatoid arthritis ([place])    Thyroid disease    Past Surgical History:  Procedure Laterality Date   ABDOMINAL HYSTERECTOMY     age 20 ovaries out   BREAST LUMPECTOMY WITH RADIOACTIVE SEED AND SENTINEL LYMPH NODE BIOPSY Right 03/28/2019   Procedure: RIGHT BREAST LUMPECTOMY WITH RADIOACTIVE SEED AND SENTINEL LYMPH NODE BIOPSY;  Surgeon: Donnie Mesa, MD;  Location: [facility];  Service: General;  Laterality: Right;  PEC BLOCK FOR POST OP PAIN   BREAST SURGERY     CATARACT EXTRACTION, BILATERAL     CHOLECYSTECTOMY     10/2019   ESOPHAGEAL DILATION     Dr. Olevia Perches 15 years from [place]     Family History  Problem Relation Age of Onset   Hypertension Mother    Thyroid disease Mother    Breast cancer Mother 22       breast dx age 56 died age 64    Prostate cancer Father 67       metastatic   Stroke Maternal Grandmother    Skin cancer Maternal Uncle        dx. in his 43s   Lung cancer Maternal Uncle        smoker, died age 41   Social History   Socioeconomic History   Marital status: Married    Spouse name: Not on file   Number of children: 2   Years of education: Not on file   Highest education level: Not on file   Occupational History   Occupation: nurse  Tobacco Use   Smoking status: Former    Packs/day: 0.25    Years: 7.00    Total pack years: 1.75    Types: Cigarettes    Quit date: 2000    Years since quitting: 23.7    Passive exposure: Past   Smokeless tobacco: Never  Vaping Use   Vaping Use:  Never used  Substance and Sexual Activity   Alcohol use: Yes    Comment: rare   Drug use: Never   Sexual activity: Yes  Other Topics Concern   Not on file  Social History Narrative   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   Has a beach house    Social Determinants of Health   Financial Resource Strain: Low Risk  (10/26/2021)   Overall Financial Resource Strain (CARDIA)    Difficulty of Paying Living Expenses: Not hard at all  Food Insecurity: No Food Insecurity (10/26/2021)   Hunger Vital Sign    Worried About Running Out of Food in the Last Year: Never true    Ran Out of Food in the Last Year: Never true  Transportation Needs: No Transportation Needs (10/26/2021)   PRAPARE - Hydrologist (Medical): No    Lack of Transportation (Non-Medical): No  Physical Activity: Not on file  Stress: No Stress Concern Present (10/26/2021)   [place]    Feeling of Stress : Not at all  Social Connections: Unknown (10/26/2021)   Social Connection and Isolation Panel [NHANES]    Frequency of Communication with Friends and Family: Not on file    Frequency of Social Gatherings with Friends and Family: Not on file    Attends Religious Services: Not on file    Active Member of Clubs or Organizations: Not on file    Attends Archivist Meetings: Not on file    Marital Status: Married    Tobacco Counseling Counseling given: Not Answered   Clinical Intake:  Pre-visit preparation completed: Yes        Diabetes: No  How often do you need to have someone help you when you read instructions, pamphlets, or other  written materials from your doctor or pharmacy?: 1 - Never  Interpreter Needed?: No      Activities of Daily Living    10/26/2021    9:36 AM  In your present state of health, do you have any difficulty performing the following activities:  Hearing? 0  Vision? 0  Difficulty concentrating or making decisions? 0  Walking or climbing stairs? 0  Dressing or bathing? 0  Doing errands, shopping? 0  Preparing Food and eating ? N  Using the Toilet? N  In the past six months, have you accidently leaked urine? N  Do you have problems with loss of bowel control? N  Managing your Medications? N  Managing your Finances? N  Housekeeping or managing your Housekeeping? N    Patient Care Team: McLean-Scocuzza, Nino Glow, MD as PCP - General (Internal Medicine) Kyung Rudd, MD as Consulting Physician (Radiation Oncology) Truitt Merle, MD as Consulting Physician (Hematology) Donnie Mesa, MD as Consulting Physician (General Surgery) Mauro Kaufmann, RN as Oncology Nurse Navigator Rockwell Germany, RN as Oncology Nurse Navigator Alla Feeling, NP as Nurse Practitioner (Nurse Practitioner)  Indicate any recent Medical Services you may have received from other than [facility] providers in the past year (date may be approximate).     Assessment:   This is a routine wellness examination for Cathrine.  I connected with  Langston Reusing on 10/26/21 by a audio enabled telemedicine application and verified that I am speaking with the correct person using two identifiers.  Patient Location: Home  Provider Location: Office/Clinic  I discussed the limitations of evaluation and management by telemedicine. The patient expressed understanding and agreed to proceed.  Hearing/Vision screen Hearing Screening - Comments:: Patient is able to hear conversational tones without difficulty. No issues reported. Vision Screening - Comments:: Cataract extraction, bilateral They have seen their ophthalmologist in  the last 12 months.   Dietary issues and exercise activities discussed: Current Exercise Habits: Home exercise routine, Type of exercise: stretching;walking, Intensity: Moderate Low carb Good water intake    Goals Addressed             This Visit's Progress    Follow up with Primary Care Provider   On track    As needed Maintain healthy weight        Depression Screen    10/26/2021    9:38 AM 07/22/2021   10:18 AM 10/10/2020    2:55 PM 10/10/2019    1:41 PM 08/28/2019    1:34 PM 08/16/2019    1:09 PM 04/27/2019    3:08 PM  PHQ 2/9 Scores  PHQ - 2 Score 0 0 0 0 0 0 0    Fall Risk    10/26/2021    9:38 AM 07/22/2021   10:18 AM 10/10/2020    3:14 PM 06/24/2020    9:34 AM 10/10/2019    1:44 PM  [place] in the past year? 0 0 0 0 0  Number falls in past yr: 0 0  0 0  Injury with Fall?  0  0   Risk for fall due to : No Fall Risks No Fall Risks     Follow up Falls evaluation completed Falls evaluation completed Falls evaluation completed Falls evaluation completed Falls evaluation completed    [place]: Home free of loose throw rugs in walkways, pet beds, electrical cords, etc? Yes  Adequate lighting in your home to reduce risk of falls? Yes   ASSISTIVE DEVICES UTILIZED TO PREVENT FALLS: Life alert? No  Use of a cane, walker or w/c? No   TIMED UP AND GO: Was the test performed? No .   Cognitive Function:        10/26/2021    9:43 AM  6CIT Screen  What Year? 0 points  What month? 0 points  What time? 0 points  Count back from 20 0 points  Months in reverse 0 points  Repeat phrase 0 points  Total Score 0 points    Immunizations Immunization History  Administered Date(s) Administered   Fluad Quad(high Dose 65+) 03/11/2020   H1N1 02/27/2008   Influenza-Unspecified 11/18/2017   PFIZER(Purple Top)SARS-COV-2 Vaccination 03/02/2019, 03/29/2019, 01/01/2020   PNEUMOCOCCAL CONJUGATE-20 07/22/2021   Pfizer Covid-19 Vaccine  Bivalent Booster 7yr & up 02/02/2021   Pneumococcal Conjugate-13 10/03/2019   Td 01/19/2004   Tdap 12/17/2014    Flu Vaccine status: Due, Education has been provided regarding the importance of this vaccine. Advised may receive this vaccine at local pharmacy or Health Dept. Aware to provide a copy of the vaccination record if obtained from local pharmacy or Health Dept. Verbalized acceptance and understanding.  Covid-19 vaccine status: Completed vaccines x4.   Shingrix Completed?: No.    Education has been provided regarding the importance of this vaccine. Patient has been advised to call insurance company to determine out of pocket expense if they have not yet received this vaccine. Advised may also receive vaccine at local pharmacy or Health Dept. Verbalized acceptance and understanding.  Screening Tests Health Maintenance  Topic Date Due   COVID-19 Vaccine (5 - Pfizer risk series) 11/11/2021 (Originally 03/30/2021)  Zoster Vaccines- Shingrix (1 of 2) 12/18/2021 (Originally 09/07/1974)   INFLUENZA VACCINE  04/18/2022 (Originally 08/18/2021)   FOOT EXAM  12/25/2021   HEMOGLOBIN A1C  01/22/2022   OPHTHALMOLOGY EXAM  02/05/2022   Diabetic kidney evaluation - GFR measurement  07/23/2022   Diabetic kidney evaluation - Urine ACR  07/23/2022   MAMMOGRAM  08/04/2023   TETANUS/TDAP  12/16/2024   COLONOSCOPY (Pts 45-69yr Insurance coverage will need to be confirmed)  08/13/2029   Pneumonia Vaccine 68 Years old  Completed   DEXA SCAN  Completed   Hepatitis C Screening  Completed   HPV VACCINES  Aged Out    Health Maintenance  There are no preventive care reminders to display for this patient.  Lung Cancer Screening: (Low Dose CT Chest recommended if Age 68-80years, 30 pack-year currently smoking OR have quit w/in 15years.) does not qualify.   Hepatitis C Screening: Completed 2021  Vision Screening: Recommended annual ophthalmology exams for early detection of glaucoma and other  disorders of the eye.  Dental Screening: Recommended annual dental exams for proper oral hygiene  Community Resource Referral / Chronic Care Management: CRR required this visit?  No   CCM required this visit?  No      Plan:     I have personally reviewed and noted the following in the patient's chart:   Medical and social history Use of alcohol, tobacco or illicit drugs  Current medications and supplements including opioid prescriptions. Patient is not currently taking opioid prescriptions. Functional ability and status Nutritional status Physical activity Advanced directives List of other physicians Hospitalizations, surgeries, and ER visits in previous 12 months Vitals Screenings to include cognitive, depression, and falls Referrals and appointments  In addition, I have reviewed and discussed with patient certain preventive protocols, quality metrics, and best practice recommendations. A written personalized care plan for preventive services as well as general preventive health recommendations were provided to patient.     OVarney Biles LPN   [id number]

## 2021-10-29 ENCOUNTER — Encounter: Payer: Self-pay | Admitting: Family Medicine

## 2021-10-29 ENCOUNTER — Ambulatory Visit (INDEPENDENT_AMBULATORY_CARE_PROVIDER_SITE_OTHER): Payer: Medicare HMO | Admitting: Family Medicine

## 2021-10-29 ENCOUNTER — Encounter: Payer: Self-pay | Admitting: Internal Medicine

## 2021-10-29 VITALS — BP 134/84 | HR 84 | Temp 98.0°F | Ht 68.0 in | Wt 186.2 lb

## 2021-10-29 DIAGNOSIS — G8929 Other chronic pain: Secondary | ICD-10-CM | POA: Diagnosis not present

## 2021-10-29 DIAGNOSIS — E039 Hypothyroidism, unspecified: Secondary | ICD-10-CM | POA: Diagnosis not present

## 2021-10-29 DIAGNOSIS — I152 Hypertension secondary to endocrine disorders: Secondary | ICD-10-CM

## 2021-10-29 DIAGNOSIS — M545 Low back pain, unspecified: Secondary | ICD-10-CM

## 2021-10-29 DIAGNOSIS — I10 Essential (primary) hypertension: Secondary | ICD-10-CM | POA: Diagnosis not present

## 2021-10-29 DIAGNOSIS — K219 Gastro-esophageal reflux disease without esophagitis: Secondary | ICD-10-CM

## 2021-10-29 DIAGNOSIS — E1165 Type 2 diabetes mellitus with hyperglycemia: Secondary | ICD-10-CM | POA: Diagnosis not present

## 2021-10-29 DIAGNOSIS — M069 Rheumatoid arthritis, unspecified: Secondary | ICD-10-CM

## 2021-10-29 DIAGNOSIS — Z23 Encounter for immunization: Secondary | ICD-10-CM

## 2021-10-29 DIAGNOSIS — E1159 Type 2 diabetes mellitus with other circulatory complications: Secondary | ICD-10-CM | POA: Diagnosis not present

## 2021-10-29 MED ORDER — FAMOTIDINE 20 MG PO TABS: 20.0000 mg | ORAL_TABLET | Freq: Two times a day (BID) | ORAL | 3 refills | Status: DC

## 2021-10-29 MED ORDER — TIZANIDINE HCL 4 MG PO TABS: 4.0000 mg | ORAL_TABLET | Freq: Four times a day (QID) | ORAL | 0 refills | Status: DC | PRN

## 2021-10-29 NOTE — Progress Notes (Signed)
    SUBJECTIVE:   CHIEF COMPLAINT / HPI: Transfer care  Patient presents to clinic to transfer care  No acute concerns today  HTN Asymptomatic. Tolerating medications. Reports taking Amlodipine, Zestoretic and Benicar.    DM Type 2 Asymptomatic.  No hypoglycemic events at home.  Tolerating medications.   GERD Takes Nexium daily.  Willing to trial weaning off and starting Pepcid.  Had EGD at [facility].   Hypothyroid Tolerating medication well. Taking Levothyroxine 100 mcg daily.  No symptoms.   PERTINENT  PMH / PSH:  HTN DM [place] 2  OBJECTIVE:   BP 134/84 (BP Location: Left Arm, Patient Position: Sitting, Cuff Size: Normal)   Pulse 84   Temp 98 F (36.7 C) (Oral)   Ht '5\' 8"'$  (1.727 m)   Wt 186 lb 3.2 oz (84.5 kg)   SpO2 98%   BMI 28.31 kg/m    General: Alert, no acute distress Cardio: Normal S1 and S2, RRR, no r/m/g Pulm: CTAB, normal work of breathing Abdomen: Bowel sounds normal. Abdomen soft and non-tender.  Extremities: No peripheral edema.  Neuro: Cranial nerves grossly intact   ASSESSMENT/PLAN:   Hypertension associated with diabetes ([place]) Stable.  Goal <120/80.  Per patient, currently on ACEi, ARB and CCB.   -Discontinue Zestoretic -Continue Benicar 20-12.5 mg daily -Continue Amlodipine 2.5 mg daily -Monitor BP at home, will review at next visit. -Follow up in 1 week, if remains elevated will likely increase Amlodipine.  -Strict return precautions provided  GERD Stable.  On longterm PPI.  Reviewed EGD from 2004, showing acute gastritis likely from NSAID use. Recent EGD 2021 at [facility], unable to see results in Epic/Care Everywhere -Trial Famotidine 20 mg BID -Wean PPI, if symptoms return restart -Follow up in 1 week.   Rheumatoid arthritis Stable. No recent flares. Follows with Rheumatology.  Hypothyroidism Stable.  Last TSH reviewed, wnl -Continue Levothyroxine 100 mcg daily -Repeat TSH yearly  Type 2 diabetes mellitus with hyperglycemia, without  long-term current use of insulin (HCC) Stable.  Last A1c 6.4,  07/23.Tolerating medication. -Continue Metformin XR 500 mg daily -Repeat A1c in 3 months   Chronic back pain Chronic. Stable.  Has been using Flexeril 10 mg at night to help with sleep. Discussed with patient switching medication given increased risks with age.  Patient agreeable to change. -Discontinue Flexeril -Start Tizanidine 4 mg QID as needed, plan to wean in future  -Follow up in 3 months   HCM Flu vaccine today Recommend Shingles, RSV  vaccines  PCV 20 (07/2021) Mammogram at [facility] in 2023- need to obtain results Colonoscopy at [facility], 07/2019- need to obtain results. DEXA 03/2021- osteopenina, currently taking calcium and vitamin d supplements TAH for uterine prolapse.  PAP not indicated and has aged out.  PDMP Reviewed  Carollee Leitz, MD

## 2021-10-29 NOTE — Patient Instructions (Signed)
It was a pleasure meeting you today. Thank you for allowing me to take part in your health care.  Our goals for today as we discussed include:  For your blood pressure Stop Lisinopril-Hydrochlorothiazide Continue Olmesartan-Hydrochlorothiazide 20-12.5 mg daily Continue Amlodipine 2.5 mg at night Monitor blood pressure at home and record readings.  Will review at next visit.   For your heartburn Start Pepcid 20 mg twice a day Decrease Nexium to every other day, then every third day and so forth until stopped.  Can use if needed.  For your back pain Stop Flexeril Start Tizanidine 4 mg at night.  Can increase as tolerated to 4 mg three times a day.  Please MyChart me the correct dose of your Levothyroxine.    Please bring in all medications at next visit  Recommend Shingles vaccine.  This is a 2 dose series and can be given at your local pharmacy.  Please talk to your pharmacist about this.   Recommend RSV vaccine.  This is a 1 dose and can be given at your local pharmacy.  Please talk to your pharmacist about this.   You received the Flu vaccine today  Please follow-up with PCP in 1 week  If you have any questions or concerns, please do not hesitate to call the office at [phone].  I look forward to our next visit and until then take care and stay safe.  Regards,   Carollee Leitz, MD   [facility] [place]

## 2021-10-30 ENCOUNTER — Encounter: Payer: Medicare HMO | Admitting: Internal Medicine

## 2021-11-05 ENCOUNTER — Ambulatory Visit (INDEPENDENT_AMBULATORY_CARE_PROVIDER_SITE_OTHER): Payer: Medicare HMO

## 2021-11-05 VITALS — BP 128/70

## 2021-11-05 DIAGNOSIS — I10 Essential (primary) hypertension: Secondary | ICD-10-CM | POA: Diagnosis not present

## 2021-11-05 NOTE — Progress Notes (Signed)
Patient here for nurse visit BP check per order from Dr. Volanda Napoleon.   Patient reports compliance with prescribed BP medications: yes  Last dose of BP medication: This morning  BP Readings from Last 3 Encounters:  11/05/21 128/70  10/29/21 134/84  08/14/21 137/85   Pulse Readings from Last 3 Encounters:  10/29/21 84  08/14/21 97  07/22/21 70    Per Dr. Volanda Napoleon Patient could leave and patient left her BP reading for 1 week with the provider to review.    Patient verbalized understanding of instructions.   Gordy Councilman, CMA

## 2021-11-07 ENCOUNTER — Encounter: Payer: Self-pay | Admitting: Family Medicine

## 2021-11-07 DIAGNOSIS — G8929 Other chronic pain: Secondary | ICD-10-CM | POA: Insufficient documentation

## 2021-11-07 NOTE — Assessment & Plan Note (Signed)
Chronic. Stable.  Has been using Flexeril 10 mg at night to help with sleep. Discussed with patient switching medication given increased risks with age.  Patient agreeable to change. -Discontinue Flexeril -Start Tizanidine 4 mg QID as needed, plan to wean in future  -Follow up in 3 months

## 2021-11-07 NOTE — Assessment & Plan Note (Signed)
Stable.  Last TSH reviewed, wnl -Continue Levothyroxine 100 mcg daily -Repeat TSH yearly

## 2021-11-07 NOTE — Assessment & Plan Note (Signed)
Stable. No recent flares. Follows with Rheumatology.

## 2021-11-07 NOTE — Assessment & Plan Note (Addendum)
Stable.  On longterm PPI.  Reviewed EGD from 2004, showing acute gastritis likely from NSAID use. Recent EGD 2021 at [facility], unable to see results in Epic/Care Everywhere -Trial Famotidine 20 mg BID -Wean PPI, if symptoms return restart -Follow up in 1 week.

## 2021-11-07 NOTE — Assessment & Plan Note (Addendum)
Stable.  Last A1c 6.4,  07/23.Tolerating medication. -Continue Metformin XR 500 mg daily -Repeat A1c in 3 months

## 2021-11-07 NOTE — Assessment & Plan Note (Deleted)
Stable.  Goal <120/80.  Per patient, currently on ACEi, ARB and CCB.   -Discontinue Zestoretic -Continue Benicar 20-12.5 mg daily -Continue Amlodipine 2.5 mg daily -Monitor BP at home, will review at next visit. -Follow up in 1 week, if remains elevated will likely increase Amlodipine.  -Strict return precautions provided

## 2021-11-07 NOTE — Assessment & Plan Note (Signed)
Stable.  Goal <120/80.  Per patient, currently on ACEi, ARB and CCB.   -Discontinue Zestoretic -Continue Benicar 20-12.5 mg daily -Continue Amlodipine 2.5 mg daily -Monitor BP at home, will review at next visit. -Follow up in 1 week, if remains elevated will likely increase Amlodipine.  -Strict return precautions provided

## 2021-11-17 ENCOUNTER — Other Ambulatory Visit: Payer: Self-pay | Admitting: Physician Assistant

## 2021-11-17 DIAGNOSIS — M0579 Rheumatoid arthritis with rheumatoid factor of multiple sites without organ or systems involvement: Secondary | ICD-10-CM

## 2021-11-17 DIAGNOSIS — H04123 Dry eye syndrome of bilateral lacrimal glands: Secondary | ICD-10-CM | POA: Diagnosis not present

## 2021-11-17 NOTE — Telephone Encounter (Signed)
Next Visit: 02/08/2022  Last Visit: 08/14/2021  Last Fill: 07/31/2021  DX: Rheumatoid arthritis involving multiple sites with positive rheumatoid factor   Current Dose per office note 08/14/2021: Arava 20 mg 1 tablet by mouth daily  Labs: 07/22/2021 Glucose 115, Alk. Phos. 33, Neutrophils Relative 34.0, Lymphocytes Relative 46.1, Monocytes Relative 13.3, Eosinophils Relative 5.5, Neutro Abs 1.3  Patient advised she is due to update labs. Patient states she will come update labs this week.   Okay to refill Arava?

## 2021-11-19 ENCOUNTER — Other Ambulatory Visit: Payer: Self-pay | Admitting: *Deleted

## 2021-11-19 DIAGNOSIS — Z79899 Other long term (current) drug therapy: Secondary | ICD-10-CM | POA: Diagnosis not present

## 2021-11-20 LAB — CBC WITH DIFFERENTIAL/PLATELET
Absolute Monocytes: 510 cells/uL (ref 200–950)
Basophils Absolute: 60 cells/uL (ref 0–200)
Basophils Relative: 1 %
Eosinophils Absolute: 222 cells/uL (ref 15–500)
Eosinophils Relative: 3.7 %
HCT: 40.6 % (ref 35.0–45.0)
Hemoglobin: 13.5 g/dL (ref 11.7–15.5)
Lymphs Abs: 2310 cells/uL (ref 850–3900)
MCH: 30.8 pg (ref 27.0–33.0)
MCHC: 33.3 g/dL (ref 32.0–36.0)
MCV: 92.7 fL (ref 80.0–100.0)
MPV: 10.4 fL (ref 7.5–12.5)
Monocytes Relative: 8.5 %
Neutro Abs: 2898 cells/uL (ref 1500–7800)
Neutrophils Relative %: 48.3 %
Platelets: 280 10*3/uL (ref 140–400)
RBC: 4.38 10*6/uL (ref 3.80–5.10)
RDW: 12.2 % (ref 11.0–15.0)
Total Lymphocyte: 38.5 %
WBC: 6 10*3/uL (ref 3.8–10.8)

## 2021-11-20 LAB — COMPLETE METABOLIC PANEL WITH GFR
AG Ratio: 1.7 (calc) (ref 1.0–2.5)
ALT: 12 U/L (ref 6–29)
AST: 17 U/L (ref 10–35)
Albumin: 4.5 g/dL (ref 3.6–5.1)
Alkaline phosphatase (APISO): 32 U/L — ABNORMAL LOW (ref 37–153)
BUN: 16 mg/dL (ref 7–25)
CO2: 26 mmol/L (ref 20–32)
Calcium: 9.7 mg/dL (ref 8.6–10.4)
Chloride: 107 mmol/L (ref 98–110)
Creat: 0.7 mg/dL (ref 0.50–1.05)
Globulin: 2.7 g/dL (calc) (ref 1.9–3.7)
Glucose, Bld: 109 mg/dL — ABNORMAL HIGH (ref 65–99)
Potassium: 4.5 mmol/L (ref 3.5–5.3)
Sodium: 143 mmol/L (ref 135–146)
Total Bilirubin: 0.3 mg/dL (ref 0.2–1.2)
Total Protein: 7.2 g/dL (ref 6.1–8.1)
eGFR: 94 mL/min/{1.73_m2} (ref >=60)

## 2021-11-20 NOTE — Progress Notes (Signed)
CBC WNL.  Glucose is 109. Alk phos is borderline low. Rest of CMP WNL.  We will continue to monitor.

## 2021-12-09 ENCOUNTER — Other Ambulatory Visit: Payer: Self-pay

## 2021-12-09 MED ORDER — TIZANIDINE HCL 4 MG PO TABS
4.0000 mg | ORAL_TABLET | Freq: Four times a day (QID) | ORAL | 1 refills | Status: DC | PRN
Start: 2021-12-09 — End: 2022-11-15

## 2021-12-16 ENCOUNTER — Other Ambulatory Visit: Payer: Self-pay | Admitting: Rheumatology

## 2021-12-16 DIAGNOSIS — M0579 Rheumatoid arthritis with rheumatoid factor of multiple sites without organ or systems involvement: Secondary | ICD-10-CM

## 2021-12-16 NOTE — Telephone Encounter (Signed)
Next Visit: 02/08/2022   Last Visit: 08/14/2021   Last Fill: 07/31/2021   DX: Rheumatoid arthritis involving multiple sites with positive rheumatoid factor    Current Dose per office note 08/14/2021: Arava 20 mg 1 tablet by mouth daily   Labs: 11/19/2021 CBC WNL. Glucose is 109. Alk phos is borderline low. Rest of CMP WNL.    Okay to refill Arava?

## 2021-12-24 ENCOUNTER — Other Ambulatory Visit: Payer: Self-pay

## 2021-12-25 IMAGING — CT CT CHEST HIGH RESOLUTION W/O CM
3 of 9 series · 16 of 36 positions shown, 18 images · non-contrast
Comparison: None.

CLINICAL DATA: Persistent cough, rule out ILD, history of
rheumatoid arthritis, smoking history

EXAM:
CT CHEST WITHOUT CONTRAST
TECHNIQUE: Multidetector CT imaging of the chest was performed following the
standard protocol without intravenous contrast. High resolution
imaging of the lungs, as well as inspiratory and expiratory imaging,
was performed.

[Series 4: high res (id) prone thorax 1.00 ax · axial · 0.69mm/px · z∈[-1055,-827]mm · 6 of 320 slices shown]
[im 46/320  lung]
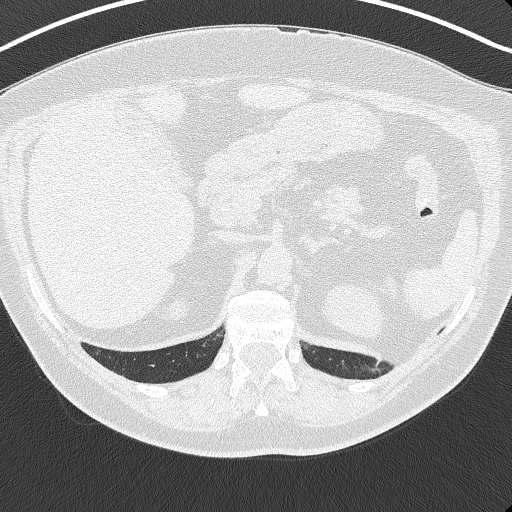
[im 92/320  lung]
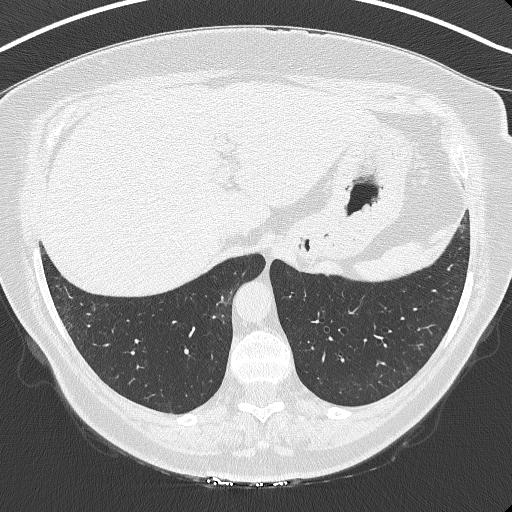
[im 137/320  lung]
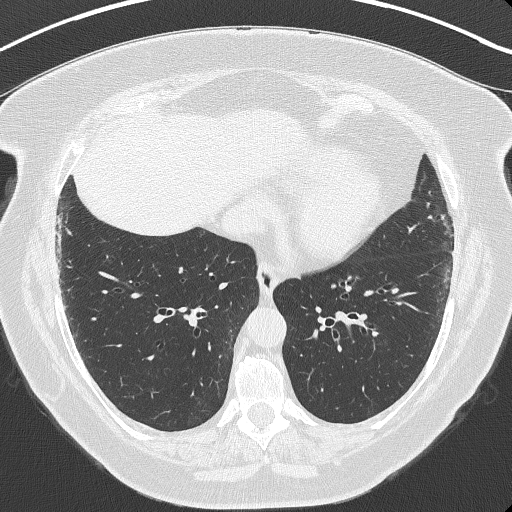
[im 183/320  lung]
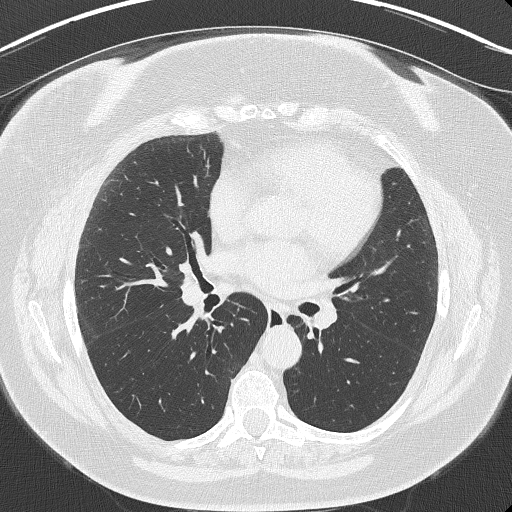
[im 228/320  lung]
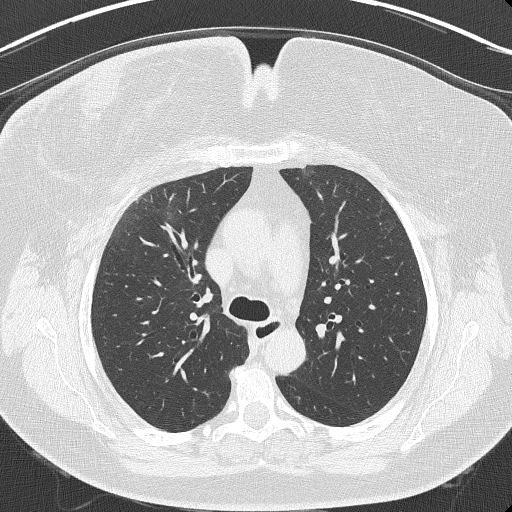
[im 274/320  lung]
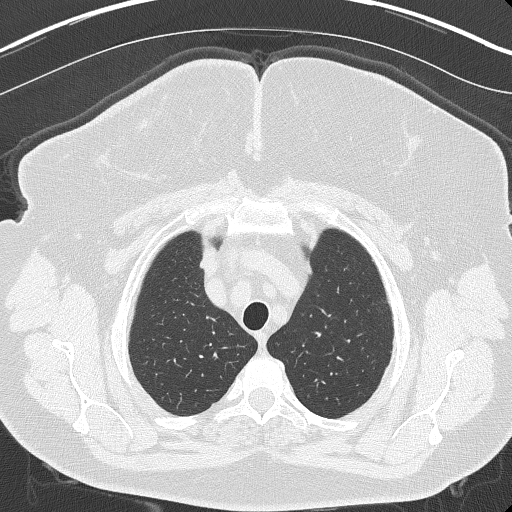

[Series 10: thorax 2.00 cor · coronal · 0.63mm/px · 3 of 176 slices shown]
[im 36/176  lung]
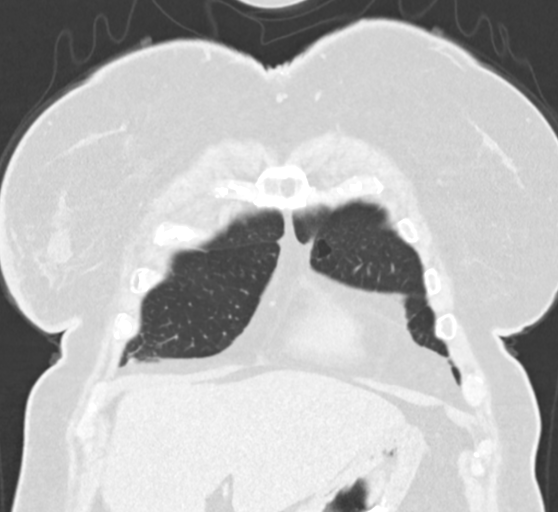
[im 71/176  lung]
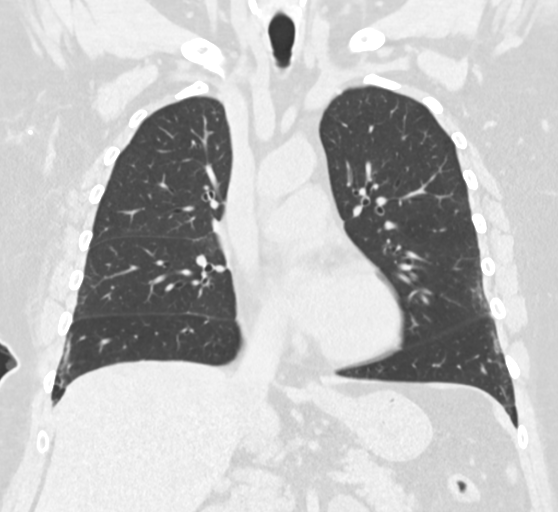
[im 106/176  lung]
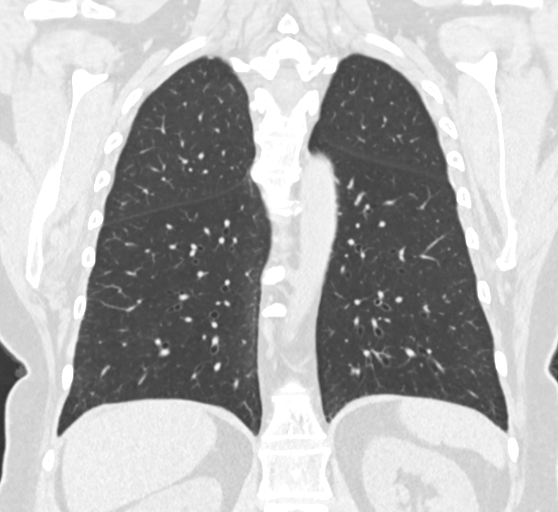

[Series 16: high res (id) thorax 1.00 ax · axial · 0.69mm/px · z∈[-1088,-847]mm · 7 of 323 slices shown, 9 images]
[im 41/323  mediastinal]
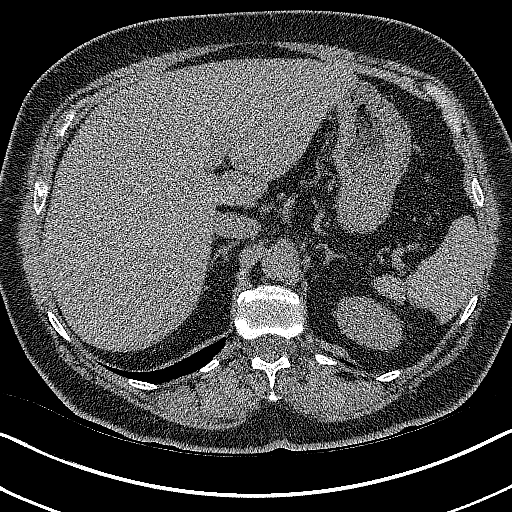
[im 41/323  lung]
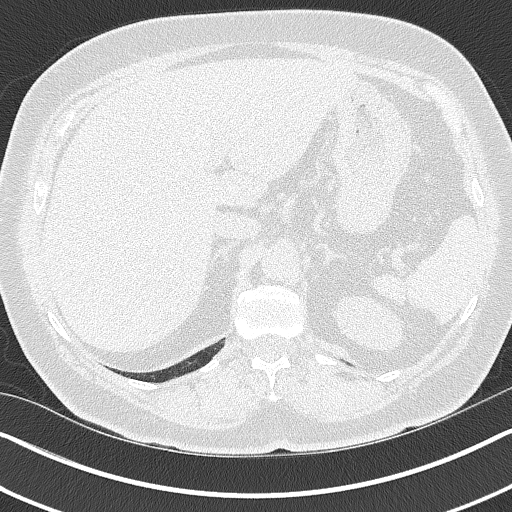
[im 81/323  lung]
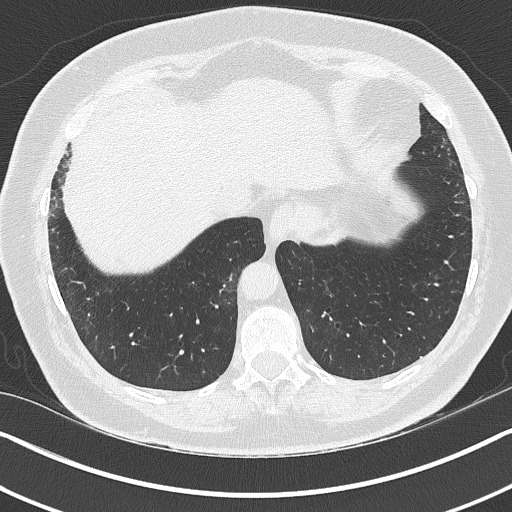
[im 121/323  lung]
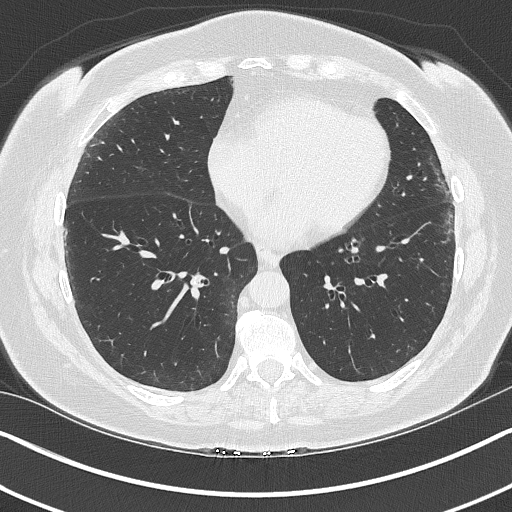
[im 162/323  lung]
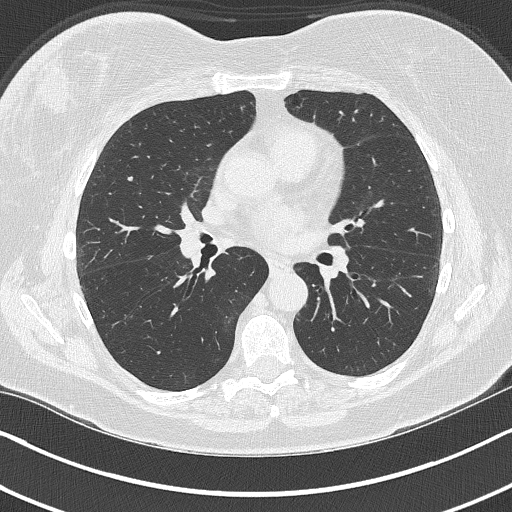
[im 202/323  mediastinal]
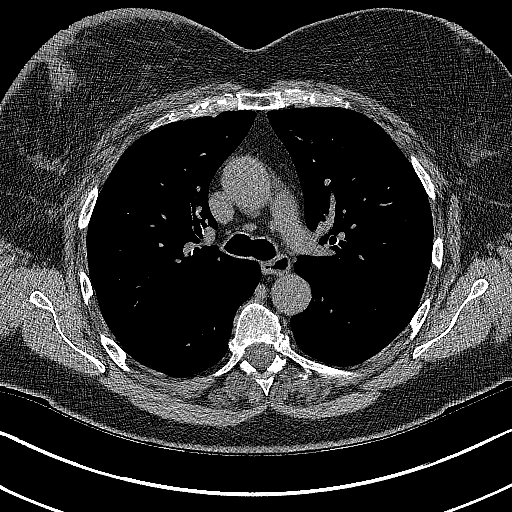
[im 202/323  lung]
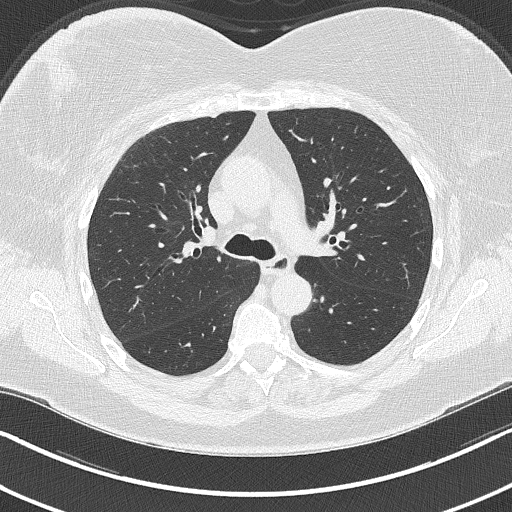
[im 242/323  lung]
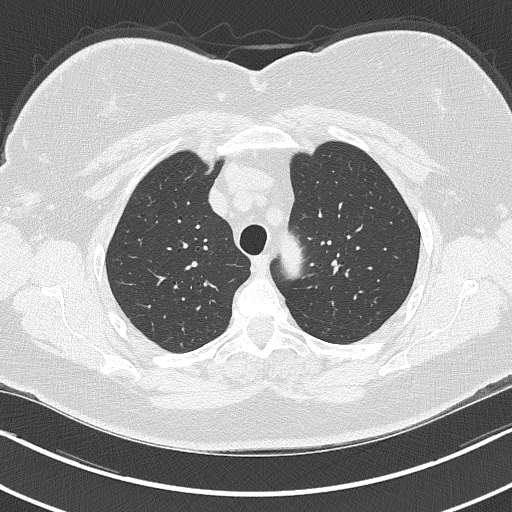
[im 282/323  lung]
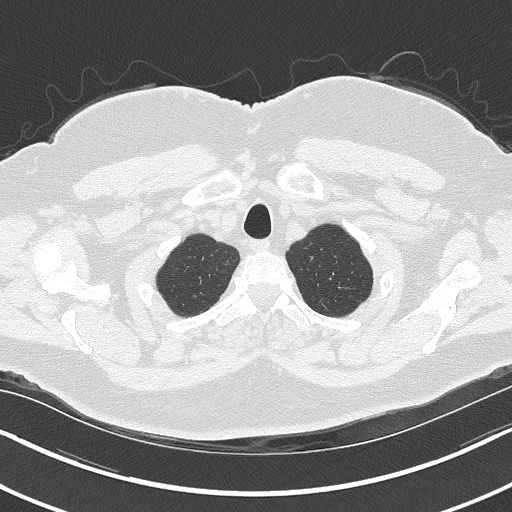

[16 of 36 positions shown; findings below may reference images not displayed]

FINDINGS: Cardiovascular: Scattered aortic atherosclerosis. Normal heart size.
No pericardial effusion.

Mediastinum/Nodes: No enlarged mediastinal, hilar, or axillary lymph
nodes. Thyroid gland, trachea, and esophagus demonstrate no
significant findings.

Lungs/Pleura: There is mild, tubular bronchiectasis in the lower
lobes and occasional bronchial plugging (e.g. Series 16, image 204).
There is minimal peripheral interstitial opacity at the lung bases,
including non dependent findings in the right middle lobe and
lingula, and some suggestion of subpleural sparing (series 16, image
223). Segmental air trapping on expiratory phase imaging. There are
innumerable tiny centrilobular pulmonary nodules, most concentrated
in the upper lobes. No pleural effusion or pneumothorax.

Upper Abdomen: No acute abnormality.

Musculoskeletal: No chest wall mass or suspicious bone lesions
identified.
IMPRESSION: 1. There is minimal peripheral interstitial opacity at the lung
bases, including non dependent findings in the right middle lobe and
lingula, and some suggestion of subpleural sparing. Mild, tubular
bronchiectasis in the lower lobes. Segmental air trapping on
expiratory phase imaging. Constellation of findings is suggestive of
mild pulmonary fibrosis in an early "indeterminate for UIP" pattern
by ATS pulmonary fibrosis criteria, differential considerations
including both NSIP and UIP. Consider follow-up ILD protocol CT in 1
year to assess for stability of fibrotic findings and pattern.
Findings are indeterminate for UIP per consensus guidelines:
Diagnosis of Idiopathic Pulmonary Fibrosis: An Official
ATS/ERS/JRS/ALAT Clinical Practice Guideline. Am J Respir Crit Care
Med Vol 198, [place] 5, [id number]-[id number][DATE].
2. Innumerable tiny centrilobular pulmonary nodules, most
concentrated in the upper lobes, most commonly seen in
smoking-related respiratory bronchiolitis.
3. Aortic Atherosclerosis ([id number]).

## 2022-01-05 ENCOUNTER — Other Ambulatory Visit: Payer: Self-pay

## 2022-01-05 MED ORDER — ESOMEPRAZOLE MAGNESIUM 40 MG PO CPDR: DELAYED_RELEASE_CAPSULE | ORAL | 3 refills | Status: DC

## 2022-01-06 ENCOUNTER — Telehealth: Payer: Self-pay

## 2022-01-06 NOTE — Telephone Encounter (Signed)
Received request for Simvastatin 40 mg last refill 09/20/21 With qty: 90.   Last office visit with you: 10/29/21 Next visit with you: 01/21/22

## 2022-01-08 ENCOUNTER — Telehealth: Payer: Self-pay | Admitting: *Deleted

## 2022-01-08 NOTE — Telephone Encounter (Signed)
Spoke with patient she had concern that we were not taking [employer], due to [employer] company had advise Dr. Terese Door was no longer covered advised patient that MD was not covered she was not practicing in seeing face to face patient that physician was no longer was with this practice.

## 2022-01-15 ENCOUNTER — Other Ambulatory Visit: Payer: Self-pay

## 2022-01-15 DIAGNOSIS — E785 Hyperlipidemia, unspecified: Secondary | ICD-10-CM

## 2022-01-15 MED ORDER — SIMVASTATIN 40 MG PO TABS: 40.0000 mg | ORAL_TABLET | Freq: Every day | ORAL | 3 refills | Status: DC

## 2022-01-21 ENCOUNTER — Ambulatory Visit: Payer: Medicare HMO | Admitting: Family Medicine

## 2022-01-21 DIAGNOSIS — E1159 Type 2 diabetes mellitus with other circulatory complications: Secondary | ICD-10-CM

## 2022-01-21 DIAGNOSIS — E1165 Type 2 diabetes mellitus with hyperglycemia: Secondary | ICD-10-CM

## 2022-01-25 NOTE — Progress Notes (Signed)
Office Visit Note  Patient: Cheryl Knight             Date of Birth: [birth year]           MRN: [id number]             PCP: Carollee Leitz, MD Referring: Orland Mustard * Visit Date: 02/08/2022 Occupation: '@GUAROCC'$ @  Subjective:  Medication monitoring   History of Present Illness: Cheryl Knight is a 67 y.o. female with history of rheumatoid arthritis, psoriasis and ILD.  She states she has not had any joint flare or joint swelling.  She continues to have some stiffness in her joints.  She has been taking leflunomide 20 mg daily.  She denies any increased shortness of breath.  She has not seen Dr. Patsey Berthold since 2021.  She states she has been very busy with other family issues.  She continues to have dry mouth dry eyes and dry skin.  She states she has tried pilocarpine in the past which was effective but was very expensive.  She would like another prescription to try.  She has not had any recent psoriasis patches.    Activities of Daily Living:  Patient reports morning stiffness for 10 minutes.   Patient Denies nocturnal pain.  Difficulty dressing/grooming: Denies Difficulty climbing stairs: Denies Difficulty getting out of chair: Denies Difficulty using hands for taps, buttons, cutlery, and/or writing: Denies  Review of Systems  Constitutional:  Negative for fatigue.  HENT:  Positive for mouth dryness. Negative for mouth sores.   Eyes:  Positive for dryness.  Respiratory:  Negative for shortness of breath.   Cardiovascular:  Negative for chest pain and palpitations.  Gastrointestinal:  Positive for constipation and diarrhea. Negative for blood in stool.  Endocrine: Negative for increased urination.  Genitourinary:  Negative for involuntary urination.  Musculoskeletal:  Positive for joint pain, joint pain, joint swelling, myalgias, morning stiffness and myalgias. Negative for gait problem, muscle weakness and muscle tenderness.  Skin:  Negative for color change,  rash, hair loss and sensitivity to sunlight.  Allergic/Immunologic: Negative for susceptible to infections.  Neurological:  Positive for headaches. Negative for dizziness.  Hematological:  Negative for swollen glands.  Psychiatric/Behavioral:  Negative for depressed mood and sleep disturbance. The patient is not nervous/anxious.     PMFS History:  Patient Active Problem List   Diagnosis Date Noted   Chronic back pain 11/07/2021   Fatty liver 06/24/2020   Pulmonary fibrosis ([place]) 10/01/2019   Pulmonary nodules 10/01/2019   Bronchiectasis without complication ([place]) 16/96/7893   Aortic atherosclerosis ([place]) 10/01/2019   Hypertension associated with diabetes ([place]) 10/01/2019   Paresthesia of upper extremity 10/01/2019   Hiatal hernia 08/16/2019   Dry eye syndrome 08/05/2019   Glaucoma 08/05/2019   Hypertensive retinopathy 08/05/2019   Arteriosclerosis 08/05/2019   Pterygium 08/05/2019   Ocular rosacea 08/05/2019   Malignant neoplasm of lower-outer quadrant of right breast of female, estrogen receptor positive ([place]) 03/19/2019   Type 2 diabetes mellitus with hyperglycemia, without long-term current use of insulin ([place]) 02/08/2019   Allergic rhinitis 02/08/2019   Osteoarthritis    Obesity (BMI 30-39.9) 12/30/2015   Basal cell carcinoma of chest wall 10/05/2011   Hyperlipidemia 02/27/2008   Hypothyroidism 02/21/2007   Migraine 02/21/2007   VAGINITIS, ATROPHIC 02/21/2007   Psoriasis 02/21/2007   Sicca syndrome, unspecified 02/21/2007   GERD 06/29/2006   Rheumatoid arthritis ([place]) 06/29/2006    Past Medical History:  Diagnosis Date   Cancer ([place]) 02/2019  right breast IDC   Cataracts, bilateral    tbd surgery 4081   Complication of anesthesia    Cough    Difficult intubation    TRACH AT 19   Dry eye syndrome    Dyspnea    W/ COUGHING   Family history of breast cancer    Family history of lung cancer    Family history of prostate cancer    Family history of skin cancer     GERD (gastroesophageal reflux disease)    Glaucoma    stable glaucoma suspect 01/19/21 [facility]   Headache    High cholesterol    History of hiatal hernia    Hypertension    Hypothyroidism    Irritable bowel    Osteoarthritis    i.e right knee    PONV (postoperative nausea and vomiting)    Pre-diabetes    Retinopathy    htn 01/19/21 [facility] OU   Rheumatoid arthritis ([place])    Thyroid disease     Family History  Problem Relation Age of Onset   Hypertension Mother    Thyroid disease Mother    Breast cancer Mother 43       breast dx age 54 died age 44    Prostate cancer Father 42       metastatic   Stroke Maternal Grandmother    Skin cancer Maternal Uncle        dx. in his 44s   Lung cancer Maternal Uncle        smoker, died age 57   Past Surgical History:  Procedure Laterality Date   ABDOMINAL HYSTERECTOMY     age 75 ovaries out   BREAST LUMPECTOMY WITH RADIOACTIVE SEED AND SENTINEL LYMPH NODE BIOPSY Right 03/28/2019   Procedure: RIGHT BREAST LUMPECTOMY WITH RADIOACTIVE SEED AND SENTINEL LYMPH NODE BIOPSY;  Surgeon: Donnie Mesa, MD;  Location: [place];  Service: General;  Laterality: Right;  PEC BLOCK FOR POST OP PAIN   BREAST SURGERY     CATARACT EXTRACTION, BILATERAL     CHOLECYSTECTOMY     10/2019   ESOPHAGEAL DILATION     Dr. 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    Immunization History  Administered Date(s) Administered   Fluad Quad(high Dose 65+) 03/11/2020, 10/29/2021   H1N1 02/27/2008   Influenza-Unspecified 11/18/2017   PFIZER(Purple Top)SARS-COV-2 Vaccination 03/02/2019, 03/29/2019, 01/01/2020   PNEUMOCOCCAL CONJUGATE-20 07/22/2021   Pfizer Covid-19 Vaccine Bivalent Booster 15yr & up 02/02/2021   Pneumococcal Conjugate-13 10/03/2019   Td 01/19/2004   Tdap 12/17/2014     Objective: Vital Signs: BP (!) 157/82  (BP Location: Left Arm, Patient Position: Sitting, Cuff Size: Normal)   Pulse 85   Resp 17   Ht '5\' 8"'$  (1.727 m)   Wt 190 lb (86.2 kg)   BMI 28.89 kg/m    Physical Exam Vitals and nursing note reviewed.  Constitutional:      Appearance: She is well-developed.  HENT:     Head: Normocephalic and atraumatic.  Eyes:     Conjunctiva/sclera: Conjunctivae normal.  Cardiovascular:     Rate and Rhythm: Normal rate and regular rhythm.     Heart sounds: Normal heart sounds.  Pulmonary:     Effort: Pulmonary effort is normal.     Breath sounds: Normal breath sounds.  Abdominal:     General: Bowel sounds are normal.     Palpations: Abdomen is soft.  Musculoskeletal:     Cervical back: Normal range of motion.  Lymphadenopathy:     Cervical: No cervical adenopathy.  Skin:    General: Skin is warm and dry.     Capillary Refill: Capillary refill takes less than 2 seconds.  Neurological:     Mental Status: She is alert and oriented to person, place, and time.  Psychiatric:        Behavior: Behavior normal.      Musculoskeletal Exam: Cervical, thoracic and lumbar spine were in good range of motion.  Shoulder joints, elbow joints, wrist joints with good range of motion.  She had bilateral PIP and DIP thickening with no synovitis.  Hip joints and knee joints in good range of motion without any warmth swelling or effusion.  There was no tenderness over ankles or MTPs.  CDAI Exam: CDAI Score: -- Patient Global: 3 mm; Provider Global: 3 mm Swollen: --; Tender: -- Joint Exam 02/08/2022   No joint exam has been documented for this visit   There is currently no information documented on the homunculus. Go to the Rheumatology activity and complete the homunculus joint exam.  Investigation: No additional findings.  Imaging: No results found.  Recent Labs: Lab Results  Component Value Date   WBC 6.0 11/19/2021   HGB 13.5 11/19/2021   PLT 280 11/19/2021   NA 143 11/19/2021   K 4.5  11/19/2021   CL 107 11/19/2021   CO2 26 11/19/2021   GLUCOSE 109 (H) 11/19/2021   BUN 16 11/19/2021   CREATININE 0.70 11/19/2021   BILITOT 0.3 11/19/2021   ALKPHOS 33 (L) 07/22/2021   AST 17 11/19/2021   ALT 12 11/19/2021   PROT 7.2 11/19/2021   ALBUMIN 4.4 07/22/2021   CALCIUM 9.7 11/19/2021   GFRAA 108 04/22/2020   QFTBGOLDPLUS NEGATIVE 04/05/2019    Speciality Comments: No specialty comments available.  Procedures:  No procedures performed Allergies: Pentazocine lactate, Demerol [meperidine], and Lisinopril   Assessment / Plan:     Visit Diagnoses: Rheumatoid arthritis involving multiple sites with positive rheumatoid factor ([place]) - +RF: She had no synovitis on examination.  She had bilateral PIP and DIP thickening.  She is doing well on leflunomide 20 mg p.o. daily.  High risk medication use - Arava 20 mg 1 tablet by mouth daily.  (Previously on Methotrexate 0.8 ml every 7 days and folic acid 1 mg 2 tablets daily.)  Labs obtained on November 19, 2021 CBC and CMP were normal.  She was advised to get labs every 3 months.  Information on immunization was placed in the AVS.  She was advised to hold leflunomide if she develops an infection and resume after the infection resolves.  ILD (interstitial lung disease) ([place]) - Mild pulmonary fibrosis in an early "indeterminate for UIP" pattern.  Diagnosed on the basis of high-resolution CT. On 09/26/19.  She has not seen Dr. Patsey Berthold since 2021.  Patient declined bronchoscopy at the last visit.  Advised to schedule a follow-up appointment.  Lungs were clear to auscultation today.  History of asthma - Diagnosed with asthma by Dr. Patsey Berthold.  Sicca syndrome (HCC)-she continues to have dry mouth and dry eye symptoms.  Per her request prescription refill for pilocarpine was given.  Side effects were reviewed.  Primary osteoarthritis of both knees no warmth swelling or effusion was noted.  She has intermittent discomfort in her knee  joints.  Primary osteoarthritis of both feet-proper wearing shoes were advised.  Psoriasis-she had no active psoriasis lesions.  History of hypothyroidism  History of hyperlipidemia-dietary modification and exercise was emphasized.  History of migraine  Malignant neoplasm of lower-outer quadrant of right breast of female, estrogen receptor positive ([place])  Orders: No orders of the defined types were placed in this encounter.  Meds ordered this encounter  Medications   pilocarpine (SALAGEN) 5 MG tablet    Sig: Take 1 tablet (5 mg total) by mouth 3 (three) times daily as needed.    Dispense:  90 tablet    Refill:  0     Follow-Up Instructions: Return in about 5 months (around 07/10/2022) for Rheumatoid arthritis.   Bo Merino, MD  Note - This record has been created using Editor, commissioning.  Chart creation errors have been sought, but may not always  have been located. Such creation errors do not reflect on  the standard of medical care.

## 2022-01-27 ENCOUNTER — Ambulatory Visit: Payer: Medicare HMO | Admitting: Family Medicine

## 2022-02-08 ENCOUNTER — Encounter: Payer: Self-pay | Admitting: Rheumatology

## 2022-02-08 ENCOUNTER — Ambulatory Visit: Payer: Medicare HMO | Attending: Rheumatology | Admitting: Rheumatology

## 2022-02-08 VITALS — BP 127/79 | HR 84 | Resp 17 | Ht 68.0 in | Wt 190.0 lb

## 2022-02-08 DIAGNOSIS — L409 Psoriasis, unspecified: Secondary | ICD-10-CM

## 2022-02-08 DIAGNOSIS — M0579 Rheumatoid arthritis with rheumatoid factor of multiple sites without organ or systems involvement: Secondary | ICD-10-CM | POA: Diagnosis not present

## 2022-02-08 DIAGNOSIS — Z8639 Personal history of other endocrine, nutritional and metabolic disease: Secondary | ICD-10-CM | POA: Diagnosis not present

## 2022-02-08 DIAGNOSIS — M19071 Primary osteoarthritis, right ankle and foot: Secondary | ICD-10-CM

## 2022-02-08 DIAGNOSIS — M170 Bilateral primary osteoarthritis of knee: Secondary | ICD-10-CM

## 2022-02-08 DIAGNOSIS — M19072 Primary osteoarthritis, left ankle and foot: Secondary | ICD-10-CM

## 2022-02-08 DIAGNOSIS — Z8669 Personal history of other diseases of the nervous system and sense organs: Secondary | ICD-10-CM

## 2022-02-08 DIAGNOSIS — Z8709 Personal history of other diseases of the respiratory system: Secondary | ICD-10-CM

## 2022-02-08 DIAGNOSIS — J849 Interstitial pulmonary disease, unspecified: Secondary | ICD-10-CM

## 2022-02-08 DIAGNOSIS — Z170 Estrogen receptor positive status [ER+]: Secondary | ICD-10-CM

## 2022-02-08 DIAGNOSIS — C50511 Malignant neoplasm of lower-outer quadrant of right female breast: Secondary | ICD-10-CM

## 2022-02-08 DIAGNOSIS — M3500 Sicca syndrome, unspecified: Secondary | ICD-10-CM

## 2022-02-08 DIAGNOSIS — Z79899 Other long term (current) drug therapy: Secondary | ICD-10-CM

## 2022-02-08 MED ORDER — PILOCARPINE HCL 5 MG PO TABS: 5.0000 mg | ORAL_TABLET | Freq: Three times a day (TID) | ORAL | 0 refills | Status: DC | PRN

## 2022-02-08 NOTE — Patient Instructions (Addendum)
  Schedule follow-up appointment with Dr. Vernard Gambles (pulmonologist)  Standing Labs We placed an order today for your standing lab work.   Please have your standing labs drawn in February and every 3 months  Please have your labs drawn 2 weeks prior to your appointment so that the provider can discuss your lab results at your appointment.  Please note that you may see your imaging and lab results in [facility] before we have reviewed them. We will contact you once all results are reviewed. Please allow our office up to 72 hours to thoroughly review all of the results before contacting the office for clarification of your results.  Lab hours are:   Monday through Thursday from 8:00 am -12:30 pm and 1:00 pm-5:00 pm and Friday from 8:00 am-12:00 pm.  Please be advised, all patients with office appointments requiring lab work will take precedent over walk-in lab work.   Labs are drawn by [facility]. Please bring your co-pay at the time of your lab draw.  You may receive a bill from [facility] for your lab work.  Please note if you are on Hydroxychloroquine and and an order has been placed for a Hydroxychloroquine level, you will need to have it drawn 4 hours or more after your last dose.  If you wish to have your labs drawn at another location, please call the office 24 hours in advance so we can fax the orders.  The office is located at [address] [place], [place], [place] [address] No appointment is necessary.    If you have any questions regarding directions or hours of operation,  please call [phone].   As a reminder, please drink plenty of water prior to coming for your lab work. Thanks!   Vaccines You are taking a medication(s) that can suppress your immune system.  The following immunizations are recommended: Flu annually Covid-19  RSV Td/Tdap (tetanus, diphtheria, pertussis) every 10 years Pneumonia (Prevnar 15 then Pneumovax 23 at least 1 year apart.  Alternatively, can  take Prevnar 20 without needing additional dose) Shingrix: 2 doses from 4 weeks to 6 months apart  Please check with your PCP to make sure you are up to date.   If you have signs or symptoms of an infection or start antibiotics: First, call your PCP for workup of your infection. Hold your medication through the infection, until you complete your antibiotics, and until symptoms resolve if you take the following: Injectable medication (Actemra, Benlysta, Cimzia, Cosentyx, Enbrel, Humira, Kevzara, Orencia, Remicade, Simponi, Stelara, Taltz, Tremfya) Methotrexate Leflunomide (Arava) Mycophenolate (Cellcept) Morrie Sheldon, Olumiant, or Rinvoq

## 2022-02-15 DIAGNOSIS — H168 Other keratitis: Secondary | ICD-10-CM | POA: Diagnosis not present

## 2022-02-15 DIAGNOSIS — M3501 Sicca syndrome with keratoconjunctivitis: Secondary | ICD-10-CM | POA: Diagnosis not present

## 2022-02-15 DIAGNOSIS — E119 Type 2 diabetes mellitus without complications: Secondary | ICD-10-CM | POA: Diagnosis not present

## 2022-02-15 DIAGNOSIS — Z961 Presence of intraocular lens: Secondary | ICD-10-CM | POA: Diagnosis not present

## 2022-02-15 LAB — HM DIABETES EYE EXAM

## 2022-02-18 ENCOUNTER — Ambulatory Visit (INDEPENDENT_AMBULATORY_CARE_PROVIDER_SITE_OTHER): Payer: Medicare HMO | Admitting: Family Medicine

## 2022-02-18 ENCOUNTER — Encounter: Payer: Self-pay | Admitting: Family Medicine

## 2022-02-18 VITALS — BP 112/72 | HR 85 | Temp 97.9°F | Ht 68.0 in | Wt 192.8 lb

## 2022-02-18 DIAGNOSIS — E039 Hypothyroidism, unspecified: Secondary | ICD-10-CM

## 2022-02-18 DIAGNOSIS — K219 Gastro-esophageal reflux disease without esophagitis: Secondary | ICD-10-CM | POA: Diagnosis not present

## 2022-02-18 DIAGNOSIS — E1165 Type 2 diabetes mellitus with hyperglycemia: Secondary | ICD-10-CM | POA: Diagnosis not present

## 2022-02-18 DIAGNOSIS — I152 Hypertension secondary to endocrine disorders: Secondary | ICD-10-CM

## 2022-02-18 DIAGNOSIS — G43709 Chronic migraine without aura, not intractable, without status migrainosus: Secondary | ICD-10-CM | POA: Diagnosis not present

## 2022-02-18 DIAGNOSIS — E1159 Type 2 diabetes mellitus with other circulatory complications: Secondary | ICD-10-CM

## 2022-02-18 DIAGNOSIS — J8410 Pulmonary fibrosis, unspecified: Secondary | ICD-10-CM | POA: Diagnosis not present

## 2022-02-18 DIAGNOSIS — I700 Atherosclerosis of aorta: Secondary | ICD-10-CM | POA: Diagnosis not present

## 2022-02-18 MED ORDER — LEVOTHYROXINE SODIUM 100 MCG PO TABS: ORAL_TABLET | ORAL | 3 refills | Status: DC

## 2022-02-18 MED ORDER — TOPIRAMATE 100 MG PO TABS: ORAL_TABLET | ORAL | 3 refills | Status: DC

## 2022-02-18 MED ORDER — OLMESARTAN MEDOXOMIL-HCTZ 20-12.5 MG PO TABS: 1.0000 | ORAL_TABLET | Freq: Every morning | ORAL | 3 refills | Status: DC

## 2022-02-18 MED ORDER — SUMATRIPTAN SUCCINATE 50 MG PO TABS: 50.0000 mg | ORAL_TABLET | ORAL | 11 refills | Status: DC | PRN

## 2022-02-18 MED ORDER — AMLODIPINE BESYLATE 2.5 MG PO TABS: ORAL_TABLET | ORAL | 3 refills | Status: DC

## 2022-02-18 MED ORDER — METFORMIN HCL ER 500 MG PO TB24: ORAL_TABLET | ORAL | 3 refills | Status: DC

## 2022-02-18 MED ORDER — FAMOTIDINE 20 MG PO TABS: 20.0000 mg | ORAL_TABLET | Freq: Two times a day (BID) | ORAL | 3 refills | Status: DC

## 2022-02-18 NOTE — Assessment & Plan Note (Signed)
Chronic.  Stable.  Last seen pulmonology 3 years ago.  Was referred by oncologist. Scheduled for end of February

## 2022-02-18 NOTE — Patient Instructions (Addendum)
It was a pleasure meeting you today. Thank you for allowing me to take part in your health care.  Our goals for today as we discussed include:  Refilled all medications  Repeat blood pressure 110/78  Recommend Shingles vaccine.  This is a 2 dose series and can be given at your local pharmacy.  Please talk to your pharmacist about this.   Foot exam at next visit  Recommend annual diabetic eye exam    If you have any questions or concerns, please do not hesitate to call the office at [phone].  I look forward to our next visit and until then take care and stay safe.  Regards,   Carollee Leitz, MD   [facility]

## 2022-02-18 NOTE — Assessment & Plan Note (Addendum)
Chronic.  Stable.   -Refill Benicar 20-12.5 mg daily -Refill amlodipine 2.5 mg daily -Monitor BP at home, goal less than 120/80.

## 2022-02-18 NOTE — Assessment & Plan Note (Signed)
Noted on CT chest 09/26/2019 On statin therapy and tolerating well.  No myalgias. Continue simvastatin 40 mg daily

## 2022-02-18 NOTE — Assessment & Plan Note (Addendum)
Chronic.  Stable. -Refill levothyroxine 100 mcg daily

## 2022-02-18 NOTE — Assessment & Plan Note (Signed)
Chronic.  Stable. Refill Imitrex

## 2022-02-18 NOTE — Assessment & Plan Note (Signed)
Chronic.  Stable.  -Refill metformin XR 500 mg daily -Plan for A1c today however patient has labs scheduled with oncology and would prefer to wait for her labs drawn together. -Follow-up with A1c

## 2022-02-18 NOTE — Progress Notes (Signed)
SUBJECTIVE:   Chief Complaint  Patient presents with   Hypertension   Hyperlipidemia   Diabetes   HPI Patient presents to clinic for follow-up hypertension.  Last seen in clinic 10/29/2021.  BP not at goal at that time.  Has been compliant with Benicar and amlodipine.  Reports blood pressures at home 120-130/60-70's.  Asymptomatic.  Not taking blood pressure medication today.  Diabetes Asymptomatic.  Currently on metformin XR 500 mg daily and tolerating well.  Last A1c 6.4.  Reports scheduled for lab work in February by oncology and to have A1c and vitamin B-12 collected at that time.   PERTINENT PMH / PSH: Hypertension Diabetes type 2 Rheumatoid arthritis Interstitial lung disease Sicca syndrome OSA Hypothyroidism Migraine History of right breast cancer, ER positive  OBJECTIVE:  BP 112/72   Pulse 85   Temp 97.9 F (36.6 C)   Ht '5\' 8"'$  (1.727 m)   Wt 192 lb 12.8 oz (87.5 kg)   SpO2 98%   BMI 29.32 kg/m    Physical Exam Vitals reviewed.  Constitutional:      General: She is not in acute distress.    Appearance: Normal appearance. She is normal weight. She is not ill-appearing, toxic-appearing or diaphoretic.  HENT:     Head: Normocephalic.  Eyes:     General:        Right eye: No discharge.        Left eye: No discharge.     Conjunctiva/sclera: Conjunctivae normal.  Cardiovascular:     Rate and Rhythm: Normal rate and regular rhythm.     Heart sounds: Normal heart sounds.  Pulmonary:     Effort: Pulmonary effort is normal.     Breath sounds: Normal breath sounds.  Abdominal:     General: Bowel sounds are normal.  Musculoskeletal:        General: Normal range of motion.     Right lower leg: No edema.     Left lower leg: No edema.  Skin:    General: Skin is warm and dry.  Neurological:     General: No focal deficit present.     Mental Status: She is alert and oriented to person, place, and time. Mental status is at baseline.  Psychiatric:         Mood and Affect: Mood normal.        Behavior: Behavior normal.        Thought Content: Thought content normal.        Judgment: Judgment normal.     ASSESSMENT/PLAN:  Type 2 diabetes mellitus with hyperglycemia, without long-term current use of insulin (HCC) Assessment & Plan: Chronic.  Stable.  -Refill metformin XR 500 mg daily -Plan for A1c today however patient has labs scheduled with oncology and would prefer to wait for her labs drawn together. -Follow-up with A1c    Orders: -     metFORMIN HCl ER; TAKE 1 TABLET(500 MG) BY MOUTH DAILY WITH BREAKFAST  Dispense: 90 tablet; Refill: 3  Hypertension associated with diabetes ([place]) Assessment & Plan: Chronic.  Stable.   -Refill Benicar 20-12.5 mg daily -Refill amlodipine 2.5 mg daily -Monitor BP at home, goal less than 120/80.    Orders: -     amLODIPine Besylate; TAKE 1 TABLET(2.5 MG) BY MOUTH DAILY  Dispense: 90 tablet; Refill: 3 -     Olmesartan Medoxomil-HCTZ; Take 1 tablet by mouth every morning.  Dispense: 90 tablet; Refill: 3  Pulmonary fibrosis (HCC) Assessment & Plan: Chronic.  Stable.  Last seen pulmonology 3 years ago.  Was referred by oncologist. Scheduled for end of February   Aortic atherosclerosis [facility]) Assessment & Plan: Noted on CT chest 09/26/2019 On statin therapy and tolerating well.  No myalgias. Continue simvastatin 40 mg daily   Hypothyroidism, unspecified type Assessment & Plan: Chronic.  Stable. -Refill levothyroxine 100 mcg daily   Orders: -     Levothyroxine Sodium; TAKE 1 TABLET BY MOUTH EVERY DAY 30 MINUTES BEFORE FOOD  Dispense: 90 tablet; Refill: 3  Gastroesophageal reflux disease without esophagitis -     Famotidine; Take 1 tablet (20 mg total) by mouth 2 (two) times daily.  Dispense: 180 tablet; Refill: 3  Chronic migraine without aura without status migrainosus, not intractable Assessment & Plan: Chronic.  Stable. Refill Imitrex   Orders: -     SUMAtriptan Succinate; Take  1 tablet (50 mg total) by mouth every 2 (two) hours as needed for migraine. Max dose 200 mg/24 hours use rec as needed only  Dispense: 10 tablet; Refill: 11 -     Topiramate; TAKE 1 TABLET(100 MG) BY MOUTH DAILY  Dispense: 90 tablet; Refill: 3   PDMP reviewed  Return for annual visit with fasting labs 1 week prior.  Carollee Leitz, MD

## 2022-02-28 NOTE — Progress Notes (Deleted)
Patient Care Team: Carollee Leitz, MD as PCP - General (Family Medicine) Kyung Rudd, MD as Consulting Physician (Radiation Oncology) Truitt Merle, MD as Consulting Physician (Hematology) Donnie Mesa, MD as Consulting Physician (General Surgery) Mauro Kaufmann, RN as Oncology Nurse Navigator Rockwell Germany, RN as Oncology Nurse Navigator Alla Feeling, NP as Nurse Practitioner (Nurse Practitioner)   CHIEF COMPLAINT: Follow up right breast cancer   Oncology History Overview Note  Cancer Staging Malignant neoplasm of lower-outer quadrant of right breast of female, estrogen receptor positive ([place]) Staging form: Breast, AJCC 8th Edition - Clinical stage from 03/14/2019: Stage IA (cT1a, cN0, cM0, G1, ER+, PR+, HER2-) - Signed by Truitt Merle, MD on 03/20/2019 - Pathologic stage from 03/28/2019: Stage IA (pT1b, pN0(sn), cM0, G1, ER+, PR+, HER2-) - Unsigned    Malignant neoplasm of lower-outer quadrant of right breast of female, estrogen receptor positive ([place])  02/22/2019 Imaging   Bone Density Scan  02/22/19 DEXA shows osteopenia with lowest T-score -1.4 at left hip   03/09/2019 Mammogram   Diagnostic Mammogram 03/09/19 IMPRESSION the 47m mass in 7:00 posiiton of right breast suspicous of malignancy.     03/14/2019 Cancer Staging   Staging form: Breast, AJCC 8th Edition - Clinical stage from 03/14/2019: Stage IA (cT1a, cN0, cM0, G1, ER+, PR+, HER2-) - Signed by [name] MD on 03/20/2019   03/14/2019 Initial Biopsy   Diagnosis 03/14/19  Breast, right, needle core biopsy, 7 o'clock, 5cmfn - INVASIVE DUCTAL CARCINOMA. SEE NOTE Diagnosis Note Carcinoma measures 0.4 cm in greatest linear dimension and appears grade 1. Dr. CJeannie Donereviewed the case and concurs with the diagnosis. A breast prognostic profile (ER, PR, Ki-67 and HER2) is pending and will be reported in an addendum. Dr. BIsaiah Blakeswas notified on 11/12/2019.   03/14/2019 Receptors her2   PROGNOSTIC  INDICATORS Results: IMMUNOHISTOCHEMICAL AND MORPHOMETRIC ANALYSIS PERFORMED MANUALLY The tumor cells are EQUIVOCAL for Her2 (2+). Her2 by FISH will be performed and results reported separately. Estrogen Receptor: 95%, POSITIVE, STRONG STAINING INTENSITY Progesterone Receptor: 90%, POSITIVE, MODERATE STAINING INTENSITY Proliferation Marker Ki67: 10%   03/19/2019 Initial Diagnosis   Malignant neoplasm of lower-outer quadrant of right breast of female, estrogen receptor positive ([place]   03/28/2019 Surgery   RIGHT BREAST LUMPECTOMY WITH RADIOACTIVE SEED AND SENTINEL LYMPH NODE BIOPSY by Dr TGeorgette Dover   03/28/2019 Pathology Results   FINAL MICROSCOPIC DIAGNOSIS:   A. BREAST, RIGHT, LUMPECTOMY:  - Invasive ductal carcinoma, 0.9 cm, Nottingham grade 1 of 3.  - Ductal carcinoma in situ, intermediate nuclear grade.  - Margins of resection are not involved.       - Invasive carcinoma, closest margin: 3 mm, superior.       - In situ carcinoma, closest margin: 3 mm, medial.  - Biopsy site.  - See oncology table.   B. SENTINEL LYMPH NODE, RIGHT AXILLARY #1, BIOPSY:  - One lymph node, negative for carcinoma (0/1).   C. SENTINEL LYMPH NODE, RIGHT AXILLARY #2, BIOPSY:  - One lymph node, negative for carcinoma (0/1).    03/28/2019 Cancer Staging   Staging form: Breast, AJCC 8th Edition - Pathologic stage from 03/28/2019: Stage IA (pT1b, pN0(sn), cM0, G1, ER+, PR+, HER2-) - Signed by [name] MD on 06/03/2019   04/26/2019 Genetic Testing   Negative genetic testing:  No pathogenic variants detected on the [facility] Common Hereditary Cancers panel. The report date is 04/26/2019.  The Common Hereditary Cancers Panel offered by [facility] includes sequencing and/or deletion duplication testing  of the following 48 genes: APC, ATM, AXIN2, BARD1, BMPR1A, BRCA1, BRCA2, BRIP1, CDH1, CDK4, CDKN2A (p14ARF), CDKN2A (p16INK4a), CHEK2, CTNNA1, DICER1, EPCAM (Deletion/duplication testing only), GREM1 (promoter region  deletion/duplication testing only), KIT, MEN1, MLH1, MSH2, MSH3, MSH6, MUTYH, NBN, NF1, NHTL1, PALB2, PDGFRA, PMS2, POLD1, POLE, PTEN, RAD50, RAD51C, RAD51D, RNF43, SDHB, SDHC, SDHD, SMAD4, SMARCA4. STK11, TP53, TSC1, TSC2, and VHL.  The following genes were evaluated for sequence changes only: SDHA and HOXB13 c.251G>A variant only.    05/08/2019 - 06/04/2019 Radiation Therapy   Adjuvant Radiation with Dr Lisbeth Renshaw    06/2019 -  Anti-estrogen oral therapy   Tamoxifen starting in 06/2019    09/04/2019 Survivorship   SCP delivered by Cira Rue, NP       CURRENT THERAPY: Tamoxifen, starting 06/2019  INTERVAL HISTORY  Ms. Casiano returns for follow up as scheduled. Last seen by Dr. Burr Medico 03/05/21. DEXA showed mild osteopenia, she started calcium/vit D. She continues tamoxifen.   ROS   Past Medical History:  Diagnosis Date   Cancer ([place]) 02/2019   right breast IDC   Cataracts, bilateral    tbd surgery [id number]   Complication of anesthesia    Cough    Difficult intubation    TRACH AT 21   Dry eye syndrome    Dyspnea    W/ COUGHING   Family history of breast cancer    Family history of lung cancer    Family history of prostate cancer    Family history of skin cancer    GERD (gastroesophageal reflux disease)    Glaucoma    stable glaucoma suspect 01/19/21 [facility]   Headache    High cholesterol    History of hiatal hernia    Hypertension    Hypothyroidism    Irritable bowel    Osteoarthritis    i.e right knee    PONV (postoperative nausea and vomiting)    Pre-diabetes    Retinopathy    htn 01/19/21 [facility]   Rheumatoid arthritis ([place])    Thyroid disease      Past Surgical History:  Procedure Laterality Date   ABDOMINAL HYSTERECTOMY     age 67 ovaries out   BREAST LUMPECTOMY WITH RADIOACTIVE SEED AND SENTINEL LYMPH NODE BIOPSY Right 03/28/2019   Procedure: RIGHT BREAST LUMPECTOMY WITH RADIOACTIVE SEED AND SENTINEL LYMPH NODE BIOPSY;  Surgeon: Donnie Mesa, MD;  Location:  [facility];  Service: General;  Laterality: Right;  PEC BLOCK FOR POST OP PAIN   BREAST SURGERY     CATARACT EXTRACTION, BILATERAL     CHOLECYSTECTOMY     10/2019   ESOPHAGEAL DILATION     Dr. Olevia Perches 15 years from [place]       Outpatient Encounter Medications as of 03/03/2022  Medication Sig   amLODipine (NORVASC) 2.5 MG tablet TAKE 1 TABLET(2.5 MG) BY MOUTH DAILY   Ascorbic Acid (VITAMIN C PO) Take 250 mg by mouth daily.   CALTRATE 600+D3 SOFT 600-20 MG-MCG CHEW daily.   esomeprazole (NEXIUM) 40 MG capsule TAKE 1 CAPSULE(40 MG) BY MOUTH DAILY   famotidine (PEPCID) 20 MG tablet Take 1 tablet (20 mg total) by mouth 2 (two) times daily.   glucosamine-chondroitin 500-400 MG tablet Take 2 tablets by mouth daily.    leflunomide (ARAVA) 20 MG tablet TAKE 1 TABLET(20 MG) BY MOUTH DAILY   levothyroxine (SYNTHROID) 100 MCG tablet TAKE 1 TABLET BY MOUTH EVERY DAY 30 MINUTES BEFORE  FOOD   MELATONIN PO Take 5 mg by mouth at bedtime.   metFORMIN (GLUCOPHAGE-XR) 500 MG 24 hr tablet TAKE 1 TABLET(500 MG) BY MOUTH DAILY WITH BREAKFAST   Multiple Vitamin (MULTIVITAMIN WITH MINERALS) TABS tablet Take 1 tablet by mouth daily.   olmesartan-hydrochlorothiazide (BENICAR HCT) 20-12.5 MG tablet Take 1 tablet by mouth every morning.   Omega-3 Fatty Acids (FISH OIL) 1000 MG CAPS Take by mouth.   pilocarpine (SALAGEN) 5 MG tablet Take 1 tablet (5 mg total) by mouth 3 (three) times daily as needed.   RESTASIS 0.05 % ophthalmic emulsion Place 1 drop into both eyes 2 (two) times daily.    simvastatin (ZOCOR) 40 MG tablet Take 1 tablet (40 mg total) by mouth at bedtime. After 6 pm d/c pravachol 40   SUMAtriptan (IMITREX) 50 MG tablet Take 1 tablet (50 mg total) by mouth every 2 (two) hours as needed for migraine. Max dose 200 mg/24 hours use rec as needed only   tamoxifen (NOLVADEX) 20 MG tablet TAKE 1 TABLET(20 MG) BY MOUTH DAILY   tiZANidine (ZANAFLEX) 4 MG tablet  Take 1 tablet (4 mg total) by mouth every 6 (six) hours as needed for muscle spasms.   topiramate (TOPAMAX) 100 MG tablet TAKE 1 TABLET(100 MG) BY MOUTH DAILY   Varenicline Tartrate (TYRVAYA NA) Place into the nose 2 (two) times daily.   No facility-administered encounter medications on file as of 03/03/2022.     There were no vitals filed for this visit. There is no height or weight on file to calculate BMI.   PHYSICAL EXAM GENERAL:alert, no distress and comfortable SKIN: no rash  EYES: sclera clear NECK: without mass LYMPH:  no palpable cervical or supraclavicular lymphadenopathy  LUNGS: clear with normal breathing effort HEART: regular rate & rhythm, no lower extremity edema ABDOMEN: abdomen soft, non-tender and normal bowel sounds NEURO: alert & oriented x 3 with fluent speech, no focal motor/sensory deficits Breast exam:  PAC without erythema    CBC    Component Value Date/Time   WBC 6.0 11/19/2021 1116   RBC 4.38 11/19/2021 1116   HGB 13.5 11/19/2021 1116   HGB 13.7 12/30/2020 1545   HCT 40.6 11/19/2021 1116   HCT 42.7 12/30/2020 1545   PLT 280 11/19/2021 1116   PLT 301 12/30/2020 1545   MCV 92.7 11/19/2021 1116   MCV 93 12/30/2020 1545   MCH 30.8 11/19/2021 1116   MCHC 33.3 11/19/2021 1116   RDW 12.2 11/19/2021 1116   RDW 12.4 12/30/2020 1545   LYMPHSABS 2,310 11/19/2021 1116   LYMPHSABS 2.3 12/30/2020 1545   MONOABS 0.5 07/22/2021 1115   EOSABS 222 11/19/2021 1116   EOSABS 0.3 12/30/2020 1545   BASOSABS 60 11/19/2021 1116   BASOSABS 0.1 12/30/2020 1545     CMP     Component Value Date/Time   NA 143 11/19/2021 1116   NA 144 12/30/2020 1545   K 4.5 11/19/2021 1116   CL 107 11/19/2021 1116   CO2 26 11/19/2021 1116   GLUCOSE 109 (H) 11/19/2021 1116   GLUCOSE 87 01/20/2006 1243   BUN 16 11/19/2021 1116   BUN 15 12/30/2020 1545   CREATININE 0.70 11/19/2021 1116   CALCIUM 9.7 11/19/2021 1116   PROT 7.2 11/19/2021 1116   PROT 6.7 12/30/2020 1545    ALBUMIN 4.4 07/22/2021 1115   ALBUMIN 4.3 12/30/2020 1545   AST 17 11/19/2021 1116   AST 18 09/01/2020 1025   ALT 12 11/19/2021 1116   ALT  13 09/01/2020 1025   ALKPHOS 33 (L) 07/22/2021 1115   BILITOT 0.3 11/19/2021 1116   BILITOT 0.2 12/30/2020 1545   BILITOT 0.4 09/01/2020 1025   GFRNONAA >60 03/05/2021 1103   GFRNONAA >60 09/01/2020 1025   GFRNONAA 93 04/22/2020 1159   GFRAA 108 04/22/2020 1159     ASSESSMENT & PLAN:  PLAN:  No orders of the defined types were placed in this encounter.     All questions were answered. The patient knows to call the clinic with any problems, questions or concerns. No barriers to learning were detected. I spent *** counseling the patient face to face. The total time spent in the appointment was *** and more than 50% was on counseling, review of test results, and coordination of care.   Cira Rue, NP-C @DATE$ @

## 2022-03-02 ENCOUNTER — Telehealth: Payer: Self-pay | Admitting: Hematology

## 2022-03-02 NOTE — Telephone Encounter (Signed)
Patient called to reschedule 2/14 appointment due to feeling ill. Patient unsure what illness is. Patient going to be tested and will call back if positive for Covid. Rescheduled appointment 1 week out in meantime.

## 2022-03-03 ENCOUNTER — Inpatient Hospital Stay: Payer: Medicare HMO | Admitting: Nurse Practitioner

## 2022-03-03 ENCOUNTER — Inpatient Hospital Stay: Payer: Medicare HMO

## 2022-03-09 ENCOUNTER — Encounter: Payer: Self-pay | Admitting: Nurse Practitioner

## 2022-03-09 ENCOUNTER — Other Ambulatory Visit: Payer: Self-pay

## 2022-03-09 ENCOUNTER — Inpatient Hospital Stay (HOSPITAL_BASED_OUTPATIENT_CLINIC_OR_DEPARTMENT_OTHER): Payer: Medicare HMO | Admitting: Nurse Practitioner

## 2022-03-09 ENCOUNTER — Inpatient Hospital Stay: Payer: Medicare HMO | Attending: Nurse Practitioner | Admitting: Hematology

## 2022-03-09 VITALS — BP 151/82 | HR 99 | Temp 98.2°F | Resp 16 | Wt 189.6 lb

## 2022-03-09 DIAGNOSIS — C50511 Malignant neoplasm of lower-outer quadrant of right female breast: Secondary | ICD-10-CM | POA: Diagnosis not present

## 2022-03-09 DIAGNOSIS — Z170 Estrogen receptor positive status [ER+]: Secondary | ICD-10-CM

## 2022-03-09 DIAGNOSIS — M069 Rheumatoid arthritis, unspecified: Secondary | ICD-10-CM | POA: Insufficient documentation

## 2022-03-09 DIAGNOSIS — E119 Type 2 diabetes mellitus without complications: Secondary | ICD-10-CM | POA: Diagnosis not present

## 2022-03-09 DIAGNOSIS — Z923 Personal history of irradiation: Secondary | ICD-10-CM | POA: Diagnosis not present

## 2022-03-09 DIAGNOSIS — Z79810 Long term (current) use of selective estrogen receptor modulators (SERMs): Secondary | ICD-10-CM | POA: Diagnosis not present

## 2022-03-09 DIAGNOSIS — M85852 Other specified disorders of bone density and structure, left thigh: Secondary | ICD-10-CM | POA: Insufficient documentation

## 2022-03-09 LAB — CBC WITH DIFFERENTIAL (CANCER CENTER ONLY)
Abs Immature Granulocytes: 0.01 10*3/uL (ref 0.00–0.07)
Basophils Absolute: 0 10*3/uL (ref 0.0–0.1)
Basophils Relative: 1 %
Eosinophils Absolute: 0.2 10*3/uL (ref 0.0–0.5)
Eosinophils Relative: 6 %
HCT: 40.7 % (ref 36.0–46.0)
Hemoglobin: 13.6 g/dL (ref 12.0–15.0)
Immature Granulocytes: 0 %
Lymphocytes Relative: 56 %
Lymphs Abs: 2 10*3/uL (ref 0.7–4.0)
MCH: 30.1 pg (ref 26.0–34.0)
MCHC: 33.4 g/dL (ref 30.0–36.0)
MCV: 90 fL (ref 80.0–100.0)
Monocytes Absolute: 0.4 10*3/uL (ref 0.1–1.0)
Monocytes Relative: 13 %
Neutro Abs: 0.8 10*3/uL — ABNORMAL LOW (ref 1.7–7.7)
Neutrophils Relative %: 24 %
Platelet Count: 242 10*3/uL (ref 150–400)
RBC: 4.52 MIL/uL (ref 3.87–5.11)
RDW: 13 % (ref 11.5–15.5)
WBC Count: 3.5 10*3/uL — ABNORMAL LOW (ref 4.0–10.5)
nRBC: 0 % (ref 0.0–0.2)

## 2022-03-09 LAB — CMP (CANCER CENTER ONLY)
ALT: 19 U/L (ref 0–44)
AST: 25 U/L (ref 15–41)
Albumin: 4.1 g/dL (ref 3.5–5.0)
Alkaline Phosphatase: 36 U/L — ABNORMAL LOW (ref 38–126)
Anion gap: 9 (ref 5–15)
BUN: 14 mg/dL (ref 8–23)
CO2: 26 mmol/L (ref 22–32)
Calcium: 9.2 mg/dL (ref 8.9–10.3)
Chloride: 109 mmol/L (ref 98–111)
Creatinine: 0.62 mg/dL (ref 0.44–1.00)
GFR, Estimated: >60 mL/min (ref >60)
Glucose, Bld: 118 mg/dL — ABNORMAL HIGH (ref 70–99)
Potassium: 3.7 mmol/L (ref 3.5–5.1)
Sodium: 144 mmol/L (ref 135–145)
Total Bilirubin: 0.4 mg/dL (ref 0.3–1.2)
Total Protein: 6.8 g/dL (ref 6.5–8.1)

## 2022-03-09 LAB — HEMOGLOBIN A1C
Hgb A1c MFr Bld: 6 % — ABNORMAL HIGH (ref 4.8–5.6)
Mean Plasma Glucose: 125.5 mg/dL

## 2022-03-09 NOTE — Progress Notes (Signed)
Patient Care Team: Carollee Leitz, MD as PCP - General (Family Medicine) Kyung Rudd, MD as Consulting Physician (Radiation Oncology) Truitt Merle, MD as Consulting Physician (Hematology) Donnie Mesa, MD as Consulting Physician (General Surgery) Mauro Kaufmann, RN as Oncology Nurse Navigator Rockwell Germany, RN as Oncology Nurse Navigator Alla Feeling, NP as Nurse Practitioner (Nurse Practitioner)   CHIEF COMPLAINT: Follow up right breast cancer   Oncology History Overview Note  Cancer Staging Malignant neoplasm of lower-outer quadrant of right breast of female, estrogen receptor positive ([place]) Staging form: Breast, AJCC 8th Edition - Clinical stage from 03/14/2019: Stage IA (cT1a, cN0, cM0, G1, ER+, PR+, HER2-) - Signed by Truitt Merle, MD on 03/20/2019 - Pathologic stage from 03/28/2019: Stage IA (pT1b, pN0(sn), cM0, G1, ER+, PR+, HER2-) - Unsigned    Malignant neoplasm of lower-outer quadrant of right breast of female, estrogen receptor positive ([place])  02/22/2019 Imaging   Bone Density Scan  02/22/19 DEXA shows osteopenia with lowest T-score -1.4 at left hip   03/09/2019 Mammogram   Diagnostic Mammogram 03/09/19 IMPRESSION the 65m mass in 7:00 posiiton of right breast suspicous of malignancy.     03/14/2019 Cancer Staging   Staging form: Breast, AJCC 8th Edition - Clinical stage from 03/14/2019: Stage IA (cT1a, cN0, cM0, G1, ER+, PR+, HER2-) - Signed by [name] MD on 03/20/2019   03/14/2019 Initial Biopsy   Diagnosis 03/14/19  Breast, right, needle core biopsy, 7 o'clock, 5cmfn - INVASIVE DUCTAL CARCINOMA. SEE NOTE Diagnosis Note Carcinoma measures 0.4 cm in greatest linear dimension and appears grade 1. Dr. CJeannie Donereviewed the case and concurs with the diagnosis. A breast prognostic profile (ER, PR, Ki-67 and HER2) is pending and will be reported in an addendum. Dr. BIsaiah Blakeswas notified on 11/12/2019.   03/14/2019 Receptors her2   PROGNOSTIC  INDICATORS Results: IMMUNOHISTOCHEMICAL AND MORPHOMETRIC ANALYSIS PERFORMED MANUALLY The tumor cells are EQUIVOCAL for Her2 (2+). Her2 by FISH will be performed and results reported separately. Estrogen Receptor: 95%, POSITIVE, STRONG STAINING INTENSITY Progesterone Receptor: 90%, POSITIVE, MODERATE STAINING INTENSITY Proliferation Marker Ki67: 10%   03/19/2019 Initial Diagnosis   Malignant neoplasm of lower-outer quadrant of right breast of female, estrogen receptor positive ([place]   03/28/2019 Surgery   RIGHT BREAST LUMPECTOMY WITH RADIOACTIVE SEED AND SENTINEL LYMPH NODE BIOPSY by Dr TGeorgette Dover   03/28/2019 Pathology Results   FINAL MICROSCOPIC DIAGNOSIS:   A. BREAST, RIGHT, LUMPECTOMY:  - Invasive ductal carcinoma, 0.9 cm, Nottingham grade 1 of 3.  - Ductal carcinoma in situ, intermediate nuclear grade.  - Margins of resection are not involved.       - Invasive carcinoma, closest margin: 3 mm, superior.       - In situ carcinoma, closest margin: 3 mm, medial.  - Biopsy site.  - See oncology table.   B. SENTINEL LYMPH NODE, RIGHT AXILLARY #1, BIOPSY:  - One lymph node, negative for carcinoma (0/1).   C. SENTINEL LYMPH NODE, RIGHT AXILLARY #2, BIOPSY:  - One lymph node, negative for carcinoma (0/1).    03/28/2019 Cancer Staging   Staging form: Breast, AJCC 8th Edition - Pathologic stage from 03/28/2019: Stage IA (pT1b, pN0(sn), cM0, G1, ER+, PR+, HER2-) - Signed by [name] MD on 06/03/2019   04/26/2019 Genetic Testing   Negative genetic testing:  No pathogenic variants detected on the [facility] Common Hereditary Cancers panel. The report date is 04/26/2019.  The Common Hereditary Cancers Panel offered by [facility] includes sequencing and/or deletion duplication testing  of the following 48 genes: APC, ATM, AXIN2, BARD1, BMPR1A, BRCA1, BRCA2, BRIP1, CDH1, CDK4, CDKN2A (p14ARF), CDKN2A (p16INK4a), CHEK2, CTNNA1, DICER1, EPCAM (Deletion/duplication testing only), GREM1 (promoter region  deletion/duplication testing only), KIT, MEN1, MLH1, MSH2, MSH3, MSH6, MUTYH, NBN, NF1, NHTL1, PALB2, PDGFRA, PMS2, POLD1, POLE, PTEN, RAD50, RAD51C, RAD51D, RNF43, SDHB, SDHC, SDHD, SMAD4, SMARCA4. STK11, TP53, TSC1, TSC2, and VHL.  The following genes were evaluated for sequence changes only: SDHA and HOXB13 c.251G>A variant only.    05/08/2019 - 06/04/2019 Radiation Therapy   Adjuvant Radiation with Dr Lisbeth Renshaw    06/2019 -  Anti-estrogen oral therapy   Tamoxifen starting in 06/2019    09/04/2019 Survivorship   SCP delivered by Cira Rue, NP       CURRENT THERAPY: Tamoxifen, starting 06/2019  INTERVAL HISTORY Cheryl Knight returns for follow up as scheduled, last seen by Dr. Burr Medico 03/05/21. DEXA 03/2021 showed osteopenia at femur neck, T score -1.9 with low frax score. Mammogram in 07/2021 was benign.  She developed right breast pain reportedly had a diagnostic mammogram in the past few months which was benign.  Pain has resolved.  She has developed IBS type symptoms alternating between constipation and urgent BM, she has a "tag" with some bleeding.  This is tolerable and she is managing at home.  Current RA medication is more helpful, with less swelling although she has chronic knee pain she overall feels better.  She had a fever last week with sinus pain, nasal congestion, and lingering cough but she feels she is recovering.  She increased vitamin C and fluids, home test for COVID was negative.  ROS  All other systems reviewed and negative  Past Medical History:  Diagnosis Date   Cancer ([place]) 02/2019   right breast IDC   Cataracts, bilateral    tbd surgery [id number]   Complication of anesthesia    Cough    Difficult intubation    TRACH AT 19   Dry eye syndrome    Dyspnea    W/ COUGHING   Family history of breast cancer    Family history of lung cancer    Family history of prostate cancer    Family history of skin cancer    GERD (gastroesophageal reflux disease)    Glaucoma    stable  glaucoma suspect 01/19/21 [facility]   Headache    High cholesterol    History of hiatal hernia    Hypertension    Hypothyroidism    Irritable bowel    Osteoarthritis    i.e right knee    PONV (postoperative nausea and vomiting)    Pre-diabetes    Retinopathy    htn 01/19/21 [facility]   Rheumatoid arthritis ([place])    Thyroid disease      Past Surgical History:  Procedure Laterality Date   ABDOMINAL HYSTERECTOMY     age 8 ovaries out   BREAST LUMPECTOMY WITH RADIOACTIVE SEED AND SENTINEL LYMPH NODE BIOPSY Right 03/28/2019   Procedure: RIGHT BREAST LUMPECTOMY WITH RADIOACTIVE SEED AND SENTINEL LYMPH NODE BIOPSY;  Surgeon: Donnie Mesa, MD;  Location: [facility];  Service: General;  Laterality: Right;  PEC BLOCK FOR POST OP PAIN   BREAST SURGERY     CATARACT EXTRACTION, BILATERAL     CHOLECYSTECTOMY     10/2019   ESOPHAGEAL DILATION     Dr. Olevia Perches 15 years from [place]       Outpatient Encounter Medications  as of 03/09/2022  Medication Sig   amLODipine (NORVASC) 2.5 MG tablet TAKE 1 TABLET(2.5 MG) BY MOUTH DAILY   Ascorbic Acid (VITAMIN C PO) Take 250 mg by mouth daily.   CALTRATE 600+D3 SOFT 600-20 MG-MCG CHEW daily.   esomeprazole (NEXIUM) 40 MG capsule TAKE 1 CAPSULE(40 MG) BY MOUTH DAILY   famotidine (PEPCID) 20 MG tablet Take 1 tablet (20 mg total) by mouth 2 (two) times daily.   glucosamine-chondroitin 500-400 MG tablet Take 2 tablets by mouth daily.    leflunomide (ARAVA) 20 MG tablet TAKE 1 TABLET(20 MG) BY MOUTH DAILY   levothyroxine (SYNTHROID) 100 MCG tablet TAKE 1 TABLET BY MOUTH EVERY DAY 30 MINUTES BEFORE FOOD   MELATONIN PO Take 5 mg by mouth at bedtime.   metFORMIN (GLUCOPHAGE-XR) 500 MG 24 hr tablet TAKE 1 TABLET(500 MG) BY MOUTH DAILY WITH BREAKFAST   Multiple Vitamin (MULTIVITAMIN WITH MINERALS) TABS tablet Take 1 tablet by mouth daily.   olmesartan-hydrochlorothiazide (BENICAR HCT) 20-12.5 MG tablet Take 1  tablet by mouth every morning.   Omega-3 Fatty Acids (FISH OIL) 1000 MG CAPS Take by mouth.   pilocarpine (SALAGEN) 5 MG tablet Take 1 tablet (5 mg total) by mouth 3 (three) times daily as needed.   RESTASIS 0.05 % ophthalmic emulsion Place 1 drop into both eyes 2 (two) times daily.    simvastatin (ZOCOR) 40 MG tablet Take 1 tablet (40 mg total) by mouth at bedtime. After 6 pm d/c pravachol 40   SUMAtriptan (IMITREX) 50 MG tablet Take 1 tablet (50 mg total) by mouth every 2 (two) hours as needed for migraine. Max dose 200 mg/24 hours use rec as needed only   tamoxifen (NOLVADEX) 20 MG tablet TAKE 1 TABLET(20 MG) BY MOUTH DAILY   tiZANidine (ZANAFLEX) 4 MG tablet Take 1 tablet (4 mg total) by mouth every 6 (six) hours as needed for muscle spasms.   topiramate (TOPAMAX) 100 MG tablet TAKE 1 TABLET(100 MG) BY MOUTH DAILY   Varenicline Tartrate (TYRVAYA NA) Place into the nose 2 (two) times daily.   No facility-administered encounter medications on file as of 03/09/2022.     Today's Vitals   03/09/22 1120  BP: (!) 151/82  Pulse: 99  Resp: 16  Temp: 98.2 F (36.8 C)  TempSrc: Oral  SpO2: 94%  Weight: 189 lb 9.6 oz (86 kg)   Body mass index is 28.83 kg/m.   PHYSICAL EXAM GENERAL:alert, no distress and comfortable SKIN: no rash  EYES: sclera clear NECK: without mass LYMPH:  no palpable cervical or supraclavicular lymphadenopathy  LUNGS: clear with normal breathing effort HEART: regular rate & rhythm, no lower extremity edema ABDOMEN: abdomen soft, non-tender and normal bowel sounds NEURO: alert & oriented x 3 with fluent speech, no focal motor/sensory deficits Breast exam: No bilateral nipple discharge or inversion.  S/p right lumpectomy, incision is completely healed with mild scar tissue.  No palpable mass or nodularity in either breast or axilla that I could appreciate.   CBC    Component Value Date/Time   WBC 3.5 (L) 03/09/2022 1059   WBC 6.0 11/19/2021 1116   RBC 4.52  03/09/2022 1059   HGB 13.6 03/09/2022 1059   HGB 13.7 12/30/2020 1545   HCT 40.7 03/09/2022 1059   HCT 42.7 12/30/2020 1545   PLT 242 03/09/2022 1059   PLT 301 12/30/2020 1545   MCV 90.0 03/09/2022 1059   MCV 93 12/30/2020 1545   MCH 30.1 03/09/2022 1059   MCHC 33.4 03/09/2022  1059   RDW 13.0 03/09/2022 1059   RDW 12.4 12/30/2020 1545   LYMPHSABS 2.0 03/09/2022 1059   LYMPHSABS 2.3 12/30/2020 1545   MONOABS 0.4 03/09/2022 1059   EOSABS 0.2 03/09/2022 1059   EOSABS 0.3 12/30/2020 1545   BASOSABS 0.0 03/09/2022 1059   BASOSABS 0.1 12/30/2020 1545     CMP     Component Value Date/Time   NA 144 03/09/2022 1059   NA 144 12/30/2020 1545   K 3.7 03/09/2022 1059   CL 109 03/09/2022 1059   CO2 26 03/09/2022 1059   GLUCOSE 118 (H) 03/09/2022 1059   GLUCOSE 87 01/20/2006 1243   BUN 14 03/09/2022 1059   BUN 15 12/30/2020 1545   CREATININE 0.62 03/09/2022 1059   CREATININE 0.70 11/19/2021 1116   CALCIUM 9.2 03/09/2022 1059   PROT 6.8 03/09/2022 1059   PROT 6.7 12/30/2020 1545   ALBUMIN 4.1 03/09/2022 1059   ALBUMIN 4.3 12/30/2020 1545   AST 25 03/09/2022 1059   ALT 19 03/09/2022 1059   ALKPHOS 36 (L) 03/09/2022 1059   BILITOT 0.4 03/09/2022 1059   GFRNONAA >60 03/09/2022 1059   GFRNONAA 93 04/22/2020 1159   GFRAA 108 04/22/2020 1159     ASSESSMENT & PLAN:Cheryl Knight is a 67 y.o. female with    1. Malignant neoplasm of lower-outer quadrant of right breast, Stage IA, p(T1bN0M0), ER/PR+, HER2-, Grade I -Diagnosed in 02/2019, s/p right lumpectomy with SLNB on 03/28/19 by Dr Georgette Dover and adjuvant radiation.  -She started antiestrogen therapy with Tamoxifen 82m once daily in 06/2019. tolerating well -Ms. CCubitis clinically doing well.  Breast exam is benign, labs are unremarkable except ANC 0.8.  Last mammogram 07/2021 was benign.  Overall no clinical concern for breast cancer recurrence  -continue surveillance and tamoxifen -Follow-up in 1 year, or sooner if needed   2.   Neutropenia -She has had a mild intermittent neutropenia since 03/06/2020, ANC 1.4 at the time, lowest 1.3 on 07/22/2021 -Previously on on methotrexate for RA, switched to leflunomide and tolerating well. Feels RA better under control with Arava -She reports recent sinus infection symptoms, fever, congestion, maxillary sinus pain, and cough. Covid home test negative.  -She has been pushing fluids and vit C, feeling better -Today's CBC shows WBC 3.5, ANC 0.8. I recommend to hold leflunomide until symptoms resolve. She will do so  3. Genetic testing -She had mother with late age breast cancer and father with prostate cancer in his 577s  -[facility] panel 04/26/19, negative    4. Comorbidities: Osteoarthritis, Rheumatoid arthritis, Migraines, high cholesterol, hypothyroidism, HTN, GERD -On Arava 233mfor RA -continue per PCP and medical team -UTD on age appropriate health maintenance. Will check A1c today per pt request   5. Osteopenia  -Her 02/22/19 DEXA showed osteopenia with lowest T-score is -1.4 at left femur, no high risk for fracture yet.  -Repeat DEXA 02/2021 showed T score -1.9, frax score had improved, still low -Continue calcium/vit D and weight bearing exercise   PLAN: -Labs reviewed -ANC 0.8 in setting of recent/resolving infection and RA medication. She will hold Arava until infection resolves. I will forward labs to rheum and PCP -Continue breast cancer surveillance and Tamoxifen -Mammo in 07/2022 (already scheduled, per pt) -F/up in 1 year, or sooner if needed -Will check A1c for PCP   Orders Placed This Encounter  Procedures   Hemoglobin A1c    Standing Status:   Future    Standing Expiration Date:   03/09/2023  All questions were answered. The patient knows to call the clinic with any problems, questions or concerns. No barriers to learning were detected. I spent 20 minutes counseling the patient face to face. The total time spent in the appointment was 30 minutes and  more than 50% was on counseling, review of test results, and coordination of care.   Cira Rue, NP-C 03/09/2022

## 2022-03-09 NOTE — Progress Notes (Signed)
Thank you for the update!

## 2022-03-16 ENCOUNTER — Ambulatory Visit: Payer: Medicare HMO | Admitting: Pulmonary Disease

## 2022-03-23 ENCOUNTER — Other Ambulatory Visit: Payer: Self-pay | Admitting: Physician Assistant

## 2022-03-23 DIAGNOSIS — M0579 Rheumatoid arthritis with rheumatoid factor of multiple sites without organ or systems involvement: Secondary | ICD-10-CM

## 2022-03-23 NOTE — Telephone Encounter (Signed)
Next Visit: 07/12/2022  Last Visit: 02/08/2022  Last Fill: 12/16/2021  DX: Rheumatoid arthritis involving multiple sites with positive rheumatoid factor   Current Dose per office note 02/08/2022: Arava 20 mg 1 tablet by mouth daily.   Labs: 03/09/2022 Glucose 118, Alk. Phos. 36, WBC 3.5, Neutro Abs. 0.8  Okay to refill Arava?

## 2022-04-28 DIAGNOSIS — H16223 Keratoconjunctivitis sicca, not specified as Sjogren's, bilateral: Secondary | ICD-10-CM | POA: Diagnosis not present

## 2022-05-04 DIAGNOSIS — D225 Melanocytic nevi of trunk: Secondary | ICD-10-CM | POA: Diagnosis not present

## 2022-05-04 DIAGNOSIS — X32XXXA Exposure to sunlight, initial encounter: Secondary | ICD-10-CM | POA: Diagnosis not present

## 2022-05-04 DIAGNOSIS — L570 Actinic keratosis: Secondary | ICD-10-CM | POA: Diagnosis not present

## 2022-05-04 DIAGNOSIS — Z08 Encounter for follow-up examination after completed treatment for malignant neoplasm: Secondary | ICD-10-CM | POA: Diagnosis not present

## 2022-05-04 DIAGNOSIS — D2272 Melanocytic nevi of left lower limb, including hip: Secondary | ICD-10-CM | POA: Diagnosis not present

## 2022-05-04 DIAGNOSIS — Z1283 Encounter for screening for malignant neoplasm of skin: Secondary | ICD-10-CM | POA: Diagnosis not present

## 2022-05-04 DIAGNOSIS — Z85828 Personal history of other malignant neoplasm of skin: Secondary | ICD-10-CM | POA: Diagnosis not present

## 2022-05-04 DIAGNOSIS — D485 Neoplasm of uncertain behavior of skin: Secondary | ICD-10-CM | POA: Diagnosis not present

## 2022-05-04 DIAGNOSIS — L119 Acantholytic disorder, unspecified: Secondary | ICD-10-CM | POA: Diagnosis not present

## 2022-05-17 DIAGNOSIS — H168 Other keratitis: Secondary | ICD-10-CM | POA: Diagnosis not present

## 2022-05-17 DIAGNOSIS — Z961 Presence of intraocular lens: Secondary | ICD-10-CM | POA: Diagnosis not present

## 2022-05-17 DIAGNOSIS — H43812 Vitreous degeneration, left eye: Secondary | ICD-10-CM | POA: Diagnosis not present

## 2022-05-17 DIAGNOSIS — M3501 Sicca syndrome with keratoconjunctivitis: Secondary | ICD-10-CM | POA: Diagnosis not present

## 2022-05-24 DIAGNOSIS — D485 Neoplasm of uncertain behavior of skin: Secondary | ICD-10-CM | POA: Diagnosis not present

## 2022-05-24 DIAGNOSIS — D2272 Melanocytic nevi of left lower limb, including hip: Secondary | ICD-10-CM | POA: Diagnosis not present

## 2022-06-24 ENCOUNTER — Other Ambulatory Visit: Payer: Self-pay | Admitting: Physician Assistant

## 2022-06-24 ENCOUNTER — Other Ambulatory Visit: Payer: Self-pay | Admitting: *Deleted

## 2022-06-24 DIAGNOSIS — M0579 Rheumatoid arthritis with rheumatoid factor of multiple sites without organ or systems involvement: Secondary | ICD-10-CM

## 2022-06-24 DIAGNOSIS — Z79899 Other long term (current) drug therapy: Secondary | ICD-10-CM

## 2022-06-24 NOTE — Telephone Encounter (Signed)
Last Fill: 03/23/2022  Labs: 03/09/2022 WBC 3.5, Neutro Abs 0.8, Glucose 118, Alkaline Phosphatase 36. I called patient, patient will have labs drawn at [facility] 06/25/2022  Next Visit: 07/12/2022  Last Visit: 02/08/2022  DX: Rheumatoid arthritis involving multiple sites with positive rheumatoid facto   Current Dose per office note 02/08/2022: Arava 20 mg 1 tablet by mouth daily   Okay to refill Arava ?

## 2022-06-25 DIAGNOSIS — Z79899 Other long term (current) drug therapy: Secondary | ICD-10-CM | POA: Diagnosis not present

## 2022-06-26 LAB — CMP14+EGFR
ALT: 18 IU/L (ref 0–32)
AST: 24 IU/L (ref 0–40)
Albumin/Globulin Ratio: 1.8 (ref 1.2–2.2)
Albumin: 4.4 g/dL (ref 3.9–4.9)
Alkaline Phosphatase: 48 IU/L (ref 44–121)
BUN/Creatinine Ratio: 20 (ref 12–28)
BUN: 13 mg/dL (ref 8–27)
Bilirubin Total: <0.2 mg/dL (ref 0.0–1.2)
CO2: 20 mmol/L (ref 20–29)
Calcium: 9.6 mg/dL (ref 8.7–10.3)
Chloride: 107 mmol/L — ABNORMAL HIGH (ref 96–106)
Creatinine, Ser: 0.65 mg/dL (ref 0.57–1.00)
Globulin, Total: 2.4 g/dL (ref 1.5–4.5)
Glucose: 115 mg/dL — ABNORMAL HIGH (ref 70–99)
Potassium: 4 mmol/L (ref 3.5–5.2)
Sodium: 143 mmol/L (ref 134–144)
Total Protein: 6.8 g/dL (ref 6.0–8.5)
eGFR: 96 mL/min/{1.73_m2} (ref >59)

## 2022-06-26 LAB — CBC WITH DIFFERENTIAL/PLATELET
Basophils Absolute: 0.1 10*3/uL (ref 0.0–0.2)
Basos: 1 %
EOS (ABSOLUTE): 0.2 10*3/uL (ref 0.0–0.4)
Eos: 5 %
Hematocrit: 42.2 % (ref 34.0–46.6)
Hemoglobin: 14 g/dL (ref 11.1–15.9)
Immature Grans (Abs): 0 10*3/uL (ref 0.0–0.1)
Immature Granulocytes: 0 %
Lymphocytes Absolute: 2.1 10*3/uL (ref 0.7–3.1)
Lymphs: 50 %
MCH: 30.5 pg (ref 26.6–33.0)
MCHC: 33.2 g/dL (ref 31.5–35.7)
MCV: 92 fL (ref 79–97)
Monocytes Absolute: 0.5 10*3/uL (ref 0.1–0.9)
Monocytes: 12 %
Neutrophils Absolute: 1.4 10*3/uL (ref 1.4–7.0)
Neutrophils: 32 %
Platelets: 285 10*3/uL (ref 150–450)
RBC: 4.59 x10E6/uL (ref 3.77–5.28)
RDW: 12.5 % (ref 11.7–15.4)
WBC: 4.3 10*3/uL (ref 3.4–10.8)

## 2022-06-28 DIAGNOSIS — H43812 Vitreous degeneration, left eye: Secondary | ICD-10-CM | POA: Diagnosis not present

## 2022-06-28 NOTE — Progress Notes (Signed)
Office Visit Note  Patient: Cheryl Knight             Date of Birth: 1955/02/19           MRN: [id number]             PCP: Dana Allan, MD Referring: Dana Allan, MD Visit Date: 07/12/2022 Occupation: @GUAROCC @  Subjective:  Mediation monitoring   History of Present Illness: Cheryl Knight is a 67 y.o. female with history of seropositive rheumatoid arthritis and ILD.  Patient remains on Arava 20 mg 1 tablet by mouth daily.  She is tolerating arava without any side effects.  She denies missing any doses of Arava recently.  She denies any signs or symptoms of a rheumatoid arthritis flare.  She remains very active performing household activities and yard work.  Patient states that if she overdoes it she develops increased arthralgias and joint stiffness.  She denies any joint swelling at this time.  She denies any new or worsening pulmonary symptoms.  She is scheduled for a high-resolution chest CT and PFTs to be updated on 07/20/2022.  She denies any recent or recurrent infections.   Activities of Daily Living:  Patient reports morning stiffness for a few hours.   Patient Denies nocturnal pain.  Difficulty dressing/grooming: Denies Difficulty climbing stairs: Reports Difficulty getting out of chair: Reports Difficulty using hands for taps, buttons, cutlery, and/or writing: Denies  Review of Systems  Constitutional:  Negative for fatigue.  HENT:  Positive for mouth dryness. Negative for mouth sores.   Eyes:  Positive for dryness.  Respiratory:  Negative for shortness of breath.   Cardiovascular:  Negative for chest pain and palpitations.  Gastrointestinal:  Positive for constipation and diarrhea. Negative for blood in stool.  Endocrine: Negative for increased urination.  Genitourinary:  Negative for involuntary urination.  Musculoskeletal:  Positive for joint pain, gait problem, joint pain, joint swelling and morning stiffness. Negative for myalgias, muscle weakness, muscle  tenderness and myalgias.  Skin:  Negative for color change, rash, hair loss and sensitivity to sunlight.  Allergic/Immunologic: Negative for susceptible to infections.  Neurological:  Positive for headaches. Negative for dizziness.  Hematological:  Negative for swollen glands.  Psychiatric/Behavioral:  Negative for depressed mood and sleep disturbance. The patient is not nervous/anxious.     PMFS History:  Patient Active Problem List   Diagnosis Date Noted   ILD (interstitial lung disease) (HCC) 07/05/2022   Chronic back pain 11/07/2021   Fatty liver 06/24/2020   Pulmonary fibrosis (HCC) 10/01/2019   Pulmonary nodules 10/01/2019   Bronchiectasis without complication (HCC) 10/01/2019   Aortic atherosclerosis (HCC) 10/01/2019   Hypertension associated with diabetes (HCC) 10/01/2019   Paresthesia of upper extremity 10/01/2019   Hiatal hernia 08/16/2019   Dry eye syndrome 08/05/2019   Glaucoma 08/05/2019   Hypertensive retinopathy 08/05/2019   Arteriosclerosis 08/05/2019   Pterygium 08/05/2019   Ocular rosacea 08/05/2019   Malignant neoplasm of lower-outer quadrant of right breast of female, estrogen receptor positive (HCC) 03/19/2019   Type 2 diabetes mellitus with hyperglycemia, without long-term current use of insulin (HCC) 02/08/2019   Allergic rhinitis 02/08/2019   Osteoarthritis    Obesity (BMI 30-39.9) 12/30/2015   Basal cell carcinoma of chest wall 10/05/2011   Hyperlipidemia 02/27/2008   Hypothyroidism 02/21/2007   Migraine 02/21/2007   VAGINITIS, ATROPHIC 02/21/2007   Psoriasis 02/21/2007   Sicca syndrome, unspecified 02/21/2007   GERD 06/29/2006   Rheumatoid arthritis (HCC) 06/29/2006    Past Medical  History:  Diagnosis Date   Cancer (HCC) 02/2019   right breast IDC   Cataracts, bilateral    tbd surgery 2021   Complication of anesthesia    Cough    Difficult intubation    TRACH AT 19   Dry eye syndrome    Dyspnea    W/ COUGHING   Family history of breast  cancer    Family history of lung cancer    Family history of prostate cancer    Family history of skin cancer    GERD (gastroesophageal reflux disease)    Glaucoma    stable glaucoma suspect 01/19/21 [facility]   Headache    High cholesterol    History of hiatal hernia    Hypertension    Hypothyroidism    Irritable bowel    Osteoarthritis    i.e right knee    PONV (postoperative nausea and vomiting)    Pre-diabetes    Retinopathy    htn 01/19/21 [facility]   Rheumatoid arthritis (HCC)    Thyroid disease     Family History  Problem Relation Age of Onset   Hypertension Mother    Thyroid disease Mother    Breast cancer Mother 86       breast dx age 78 died age 56    Prostate cancer Father 40       metastatic   Stroke Maternal Grandmother    Skin cancer Maternal Uncle        dx. in his 27s   Lung cancer Maternal Uncle        smoker, died age 58   Past Surgical History:  Procedure Laterality Date   ABDOMINAL HYSTERECTOMY     age 34 ovaries out   BREAST LUMPECTOMY WITH RADIOACTIVE SEED AND SENTINEL LYMPH NODE BIOPSY Right 03/28/2019   Procedure: RIGHT BREAST LUMPECTOMY WITH RADIOACTIVE SEED AND SENTINEL LYMPH NODE BIOPSY;  Surgeon: Manus Rudd, MD;  Location: [facility];  Service: General;  Laterality: Right;  PEC BLOCK FOR POST OP PAIN   BREAST SURGERY     CATARACT EXTRACTION, BILATERAL     CHOLECYSTECTOMY     10/2019   ESOPHAGEAL DILATION     Dr. 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    Immunization History  Administered Date(s) Administered   Fluad Quad(high Dose 65+) 03/11/2020, 10/29/2021   H1N1 02/27/2008   Influenza-Unspecified 11/18/2017   PFIZER(Purple Top)SARS-COV-2 Vaccination 03/02/2019, 03/29/2019, 01/01/2020   PNEUMOCOCCAL CONJUGATE-20 07/22/2021   Pfizer Covid-19 Vaccine  Bivalent Booster 21yrs & up 02/02/2021   Pneumococcal Conjugate-13 10/03/2019   Td 01/19/2004   Tdap 12/17/2014     Objective: Vital Signs: BP 133/78 (BP Location: Left Arm, Patient Position: Sitting, Cuff Size: Large)   Pulse 83   Resp 16   Ht 5\' 8"  (1.727 m)   Wt 197 lb 3.2 oz (89.4 kg)   BMI 29.98 kg/m    Physical Exam Vitals and nursing note reviewed.  Constitutional:      Appearance: She is well-developed.  HENT:     Head: Normocephalic and atraumatic.  Eyes:     Conjunctiva/sclera: Conjunctivae normal.  Cardiovascular:     Rate and Rhythm: Normal rate and regular rhythm.  Heart sounds: Normal heart sounds.  Pulmonary:     Effort: Pulmonary effort is normal.     Breath sounds: Normal breath sounds.  Abdominal:     General: Bowel sounds are normal.     Palpations: Abdomen is soft.  Musculoskeletal:     Cervical back: Normal range of motion.  Lymphadenopathy:     Cervical: No cervical adenopathy.  Skin:    General: Skin is warm and dry.     Capillary Refill: Capillary refill takes less than 2 seconds.  Neurological:     Mental Status: She is alert and oriented to person, place, and time.  Psychiatric:        Behavior: Behavior normal.      Musculoskeletal Exam: C-spine, thoracic spine, lumbar spine have good range of motion.  Shoulder joints, elbow joints, wrist joints, MCPs, PIPs, DIPs have good range of motion with no synovitis.  PIP and DIP thickening consistent with osteoarthritis of both hands.  Hip joints have good range of motion with no groin pain.  No tenderness over the trochanteric bursa at this time.  Knee joints have good range of motion with no knee joint effusion noted.  Ankle joints have good range of motion with no joint tenderness.  Some pedal edema noted bilaterally.  PIP and DIP thickening consistent with osteoarthritis of both feet.  CDAI Exam: CDAI Score: -- Patient Global: 30 / 100; Provider Global: 30 / 100 Swollen: --; Tender:  -- Joint Exam 07/12/2022   No joint exam has been documented for this visit   There is currently no information documented on the homunculus. Go to the Rheumatology activity and complete the homunculus joint exam.  Investigation: No additional findings.  Imaging: No results found.  Recent Labs: Lab Results  Component Value Date   WBC 4.3 06/25/2022   HGB 14.0 06/25/2022   PLT 285 06/25/2022   NA 143 06/25/2022   K 4.0 06/25/2022   CL 107 (H) 06/25/2022   CO2 20 06/25/2022   GLUCOSE 115 (H) 06/25/2022   BUN 13 06/25/2022   CREATININE 0.65 06/25/2022   BILITOT <0.2 06/25/2022   ALKPHOS 48 06/25/2022   AST 24 06/25/2022   ALT 18 06/25/2022   PROT 6.8 06/25/2022   ALBUMIN 4.4 06/25/2022   CALCIUM 9.6 06/25/2022   GFRAA 108 04/22/2020   QFTBGOLDPLUS NEGATIVE 04/05/2019    Speciality Comments: No specialty comments available.  Procedures:  No procedures performed Allergies: Pentazocine lactate, Demerol [meperidine], and Lisinopril    Assessment / Plan:     Visit Diagnoses: Rheumatoid arthritis involving multiple sites with positive rheumatoid factor (HCC) - +RF:  She has no joint tenderness or synovitis on examination today.  Overall she has clinically been doing well take Arava 20 mg 1 tablet by mouth daily.  She is tolerating Arava without any side effects and has not missed any doses recently.  Patient remains active on a daily basis but at times overdoes it and has increased arthralgias and joint stiffness.  On examination today no inflammation was noted.  She will remain on Arava as prescribed.  She was advised to notify us if she develops signs or symptoms of a flare.  She will follow-up in the office in 5 months or sooner if needed.  High risk medication use - Arava 20 mg 1 tablet by mouth daily.  (Previously on Methotrexate 0.8 ml every 7 days and folic acid 1 mg 2 tablets daily.) CBC and CMP updated on 06/25/22.  Her next  lab work will be due in September and every 3  months.  Standing orders for CBC and CMP at [facility] remain in place. No recent or recurrent infections.  Discussed the importance of holding Arava if she develops signs or symptoms of an infection and to resume once the infection is completely cleared.  ILD (interstitial lung disease) (HCC) - Mild pulmonary fibrosis in an early "indeterminate for UIP" pattern.  Diagnosed on the basis of high-resolution CT. On 09/26/19. Reviewed with our pulmonary office visit note from 07/05/2022.  Patient is scheduled to update high-resolution chest CT and PFTs on 07/20/2022. She is not experiencing any new or worsening pulmonary symptoms.    History of asthma - Diagnosed with asthma by Dr. Jayme Cloud.  Sicca syndrome (HCC) - She is no longer taking pilocarpine or using restasis.   Primary osteoarthritis of both knees: Good range of motion of both knee joints on examination today.  No knee joint effusion noted.  Patient recently had an episode of right knee joint pain after performing yard work for prolonged periods of time.  Primary osteoarthritis of both feet: She has PIP and DIP thickening consistent with osteoarthritis of both feet.  No tenderness or synovitis over MTP joints.  Good range of motion of both ankle joints with no tenderness or synovitis.  Psoriasis: No active psoriasis at this time.  Other medical conditions are listed as follows:  History of hyperlipidemia  History of hypothyroidism  Malignant neoplasm of lower-outer quadrant of right breast of female, estrogen receptor positive (HCC)  History of migraine  Orders: No orders of the defined types were placed in this encounter.  No orders of the defined types were placed in this encounter.    Follow-Up Instructions: Return in about 5 months (around 12/12/2022) for Rheumatoid arthritis, ILD.   Gearldine Bienenstock, PA-C  Note - This record has been created using Dragon software.  Chart creation errors have been sought, but may not always   have been located. Such creation errors do not reflect on  the standard of medical care.

## 2022-06-28 NOTE — Progress Notes (Signed)
CBC WNL  Glucose is 115. Rest of CMP WNL

## 2022-07-05 ENCOUNTER — Encounter: Payer: Self-pay | Admitting: Pulmonary Disease

## 2022-07-05 ENCOUNTER — Ambulatory Visit: Payer: Medicare HMO | Admitting: Pulmonary Disease

## 2022-07-05 VITALS — BP 120/80 | HR 83 | Temp 97.7°F | Ht 68.0 in | Wt 197.0 lb

## 2022-07-05 DIAGNOSIS — M0579 Rheumatoid arthritis with rheumatoid factor of multiple sites without organ or systems involvement: Secondary | ICD-10-CM

## 2022-07-05 DIAGNOSIS — R059 Cough, unspecified: Secondary | ICD-10-CM | POA: Diagnosis not present

## 2022-07-05 DIAGNOSIS — J849 Interstitial pulmonary disease, unspecified: Secondary | ICD-10-CM | POA: Insufficient documentation

## 2022-07-05 DIAGNOSIS — K449 Diaphragmatic hernia without obstruction or gangrene: Secondary | ICD-10-CM | POA: Diagnosis not present

## 2022-07-05 DIAGNOSIS — K219 Gastro-esophageal reflux disease without esophagitis: Secondary | ICD-10-CM

## 2022-07-05 NOTE — Progress Notes (Signed)
Subjective:    Patient ID: Cheryl Knight, female    DOB: 1955-01-30, 67 y.o.   MRN: [id number] Patient Care Team: Dana Allan, MD as PCP - General (Family Medicine) Dorothy Puffer, MD as Consulting Physician (Radiation Oncology) Malachy Mood, MD as Consulting Physician (Hematology) Manus Rudd, MD as Consulting Physician (General Surgery) Pershing Proud, RN as Oncology Nurse Navigator Donnelly Angelica, RN as Oncology Nurse Navigator Pollyann Samples, NP as Nurse Practitioner (Nurse Practitioner)  Chief Complaint  Patient presents with   Follow-up    No SOB or wheezing. Cough at night and after she eats.    HPI Patient is a 67 year old remote former smoker (total of approximately 7-pack-year history) who follows up for the issue of mild fibrotic changes noted on high-resolution CT of the chest and cough.  We have not seen the patient since 08 April 2020.  For the details of her last visit please refer to that note.  Recall that she has a history of rheumatoid arthritis and is currently on Arava.  She also has significant gastroesophageal reflux with active regurgitation and follows with GI.  She has been followed by her rheumatologist Dr. Corliss Skains who recommended follow-up with our office since she had not been seen since 2022.  Patient states that she has been feeling well without shortness of breath or wheezing and did not feel that she needed follow-up here.  She has for the most part good control of her cough unless she has issues with her reflux after a heavy meal.  For the most part though her cough is very well-controlled.  She does not endorse any progressive shortness of breath.  No orthopnea or paroxysmal nocturnal dyspnea.  No lower extremity edema.  She follows antireflux measures but even with this still has breakthrough issues with GERD.  That she has rheumatoid arthritis she has been on [place] and is tolerating this.  She does not endorse any other symptomatology today.   Overall she feels well and looks well.   Review of Systems A 10 point review of systems was performed and it is as noted above otherwise negative.  Past Medical History:  Diagnosis Date   Cancer (HCC) 02/2019   right breast IDC   Cataracts, bilateral    tbd surgery 2021   Complication of anesthesia    Cough    Difficult intubation    TRACH AT 19   Dry eye syndrome    Dyspnea    W/ COUGHING   Family history of breast cancer    Family history of lung cancer    Family history of prostate cancer    Family history of skin cancer    GERD (gastroesophageal reflux disease)    Glaucoma    stable glaucoma suspect 01/19/21 [facility]   Headache    High cholesterol    History of hiatal hernia    Hypertension    Hypothyroidism    Irritable bowel    Osteoarthritis    i.e right knee    PONV (postoperative nausea and vomiting)    Pre-diabetes    Retinopathy    htn 01/19/21 [facility]   Rheumatoid arthritis (HCC)    Thyroid disease    Past Surgical History:  Procedure Laterality Date   ABDOMINAL HYSTERECTOMY     age 18 ovaries out   BREAST LUMPECTOMY WITH RADIOACTIVE SEED AND SENTINEL LYMPH NODE BIOPSY Right 03/28/2019   Procedure: RIGHT BREAST LUMPECTOMY WITH RADIOACTIVE SEED AND SENTINEL LYMPH NODE  BIOPSY;  Surgeon: Manus Rudd, MD;  Location: [facility];  Service: General;  Laterality: Right;  PEC BLOCK FOR POST OP PAIN   BREAST SURGERY     CATARACT EXTRACTION, BILATERAL     CHOLECYSTECTOMY     10/2019   ESOPHAGEAL DILATION     Dr. Juanda Chance 15 years from 2021   TONSILLECTOMY     TUBAL LIGATION     Patient Active Problem List   Diagnosis Date Noted   Chronic back pain 11/07/2021   Fatty liver 06/24/2020   Pulmonary fibrosis (HCC) 10/01/2019   Pulmonary nodules 10/01/2019   Bronchiectasis without complication (HCC) 10/01/2019   Aortic atherosclerosis (HCC) 10/01/2019   Hypertension associated with diabetes (HCC) 10/01/2019   Paresthesia of upper  extremity 10/01/2019   Hiatal hernia 08/16/2019   Dry eye syndrome 08/05/2019   Glaucoma 08/05/2019   Hypertensive retinopathy 08/05/2019   Arteriosclerosis 08/05/2019   Pterygium 08/05/2019   Ocular rosacea 08/05/2019   Malignant neoplasm of lower-outer quadrant of right breast of female, estrogen receptor positive (HCC) 03/19/2019   Type 2 diabetes mellitus with hyperglycemia, without long-term current use of insulin (HCC) 02/08/2019   Allergic rhinitis 02/08/2019   Osteoarthritis    Obesity (BMI 30-39.9) 12/30/2015   Basal cell carcinoma of chest wall 10/05/2011   Hyperlipidemia 02/27/2008   Hypothyroidism 02/21/2007   Migraine 02/21/2007   VAGINITIS, ATROPHIC 02/21/2007   Psoriasis 02/21/2007   Sicca syndrome, unspecified 02/21/2007   GERD 06/29/2006   Rheumatoid arthritis (HCC) 06/29/2006   Family History  Problem Relation Age of Onset   Hypertension Mother    Thyroid disease Mother    Breast cancer Mother 70       breast dx age 90 died age 49    Prostate cancer Father 57       metastatic   Stroke Maternal Grandmother    Skin cancer Maternal Uncle        dx. in his 3s   Lung cancer Maternal Uncle        smoker, died age 2   Social History   Tobacco Use   Smoking status: Former    Packs/day: 0.25    Years: 7.00    Additional pack years: 0.00    Total pack years: 1.75    Types: Cigarettes    Quit date: 2000    Years since quitting: 24.4    Passive exposure: Past   Smokeless tobacco: Never  Substance Use Topics   Alcohol use: Not Currently    Comment: rare    Allergies  Allergen Reactions   Pentazocine Lactate Other (See Comments)    Hallucinations   Demerol [Meperidine] Nausea And Vomiting   Lisinopril Cough   Current Meds  Medication Sig   amLODipine (NORVASC) 2.5 MG tablet TAKE 1 TABLET(2.5 MG) BY MOUTH DAILY   Ascorbic Acid (VITAMIN C PO) Take 250 mg by mouth daily.   CALTRATE 600+D3 SOFT 600-20 MG-MCG CHEW daily.   esomeprazole (NEXIUM) 40  MG capsule TAKE 1 CAPSULE(40 MG) BY MOUTH DAILY   famotidine (PEPCID) 20 MG tablet Take 1 tablet (20 mg total) by mouth 2 (two) times daily.   glucosamine-chondroitin 500-400 MG tablet Take 2 tablets by mouth daily.    leflunomide (ARAVA) 20 MG tablet TAKE 1 TABLET(20 MG) BY MOUTH DAILY   levothyroxine (SYNTHROID) 100 MCG tablet TAKE 1 TABLET BY MOUTH EVERY DAY 30 MINUTES BEFORE FOOD   MELATONIN PO Take 5 mg by mouth at bedtime.   metFORMIN (  GLUCOPHAGE-XR) 500 MG 24 hr tablet TAKE 1 TABLET(500 MG) BY MOUTH DAILY WITH BREAKFAST   MIEBO 1.338 GM/ML SOLN Apply 1 drop to eye in the morning and at bedtime.   Multiple Vitamin (MULTIVITAMIN WITH MINERALS) TABS tablet Take 1 tablet by mouth daily.   olmesartan-hydrochlorothiazide (BENICAR HCT) 20-12.5 MG tablet Take 1 tablet by mouth every morning.   Omega-3 Fatty Acids (FISH OIL) 1000 MG CAPS Take by mouth.   pilocarpine (SALAGEN) 5 MG tablet Take 1 tablet (5 mg total) by mouth 3 (three) times daily as needed.   RESTASIS 0.05 % ophthalmic emulsion Place 1 drop into both eyes 2 (two) times daily.    simvastatin (ZOCOR) 40 MG tablet Take 1 tablet (40 mg total) by mouth at bedtime. After 6 pm d/c pravachol 40   SUMAtriptan (IMITREX) 50 MG tablet Take 1 tablet (50 mg total) by mouth every 2 (two) hours as needed for migraine. Max dose 200 mg/24 hours use rec as needed only   tamoxifen (NOLVADEX) 20 MG tablet TAKE 1 TABLET(20 MG) BY MOUTH DAILY   tiZANidine (ZANAFLEX) 4 MG tablet Take 1 tablet (4 mg total) by mouth every 6 (six) hours as needed for muscle spasms.   topiramate (TOPAMAX) 100 MG tablet TAKE 1 TABLET(100 MG) BY MOUTH DAILY   Immunization History  Administered Date(s) Administered   Fluad Quad(high Dose 65+) 03/11/2020, 10/29/2021   H1N1 02/27/2008   Influenza-Unspecified 11/18/2017   PFIZER(Purple Top)SARS-COV-2 Vaccination 03/02/2019, 03/29/2019, 01/01/2020   PNEUMOCOCCAL CONJUGATE-20 07/22/2021   Pfizer Covid-19 Vaccine Bivalent Booster  67yrs & up 02/02/2021   Pneumococcal Conjugate-13 10/03/2019   Td 01/19/2004   Tdap 12/17/2014      Objective:   Physical Exam BP 120/80 (BP Location: Left Arm, Cuff Size: Large)   Pulse 83   Temp 97.7 F (36.5 C)   Ht 5\' 8"  (1.727 m)   Wt 197 lb (89.4 kg)   SpO2 97%   BMI 29.95 kg/m   SpO2: 97 % O2 Device: None (Room air)  GENERAL:  Overweight woman, no acute distress.  Fully ambulatory. HEAD: Normocephalic, atraumatic.  EYES: Pupils equal, round, reactive to light.  No scleral icterus.  MOUTH: Nose/mouth/throat not examined due to masking requirements for COVID 19. NECK: Supple. No thyromegaly. Trachea midline. No JVD.  No adenopathy. PULMONARY: Good air entry bilaterally, no adventitious sounds.   CARDIOVASCULAR: S1 and S2. Regular rate and rhythm.  Grade 1/6 systolic ejection murmur left sternal border, unchanged from prior. GASTROINTESTINAL: Obese, benign. MUSCULOSKELETAL: No joint deformity, no clubbing, no edema.  NEUROLOGIC: Awake, alert, oriented, fully ambulatory no gait disturbance noted.  Speech is fluent, no overt focal deficits. SKIN: Intact,warm,dry.  Limited exam shows no rashes. PSYCH: Mood and behavior appropriate      Assessment & Plan:     ICD-10-CM   1. ILD (interstitial lung disease) (HCC)  J84.9 CT CHEST HIGH RESOLUTION    Pulmonary Function Test [facility] Only   High resolution CT chest PFTs May be related to rheumatoid arthritis    2. Rheumatoid arthritis involving multiple sites with positive rheumatoid factor (HCC)  M05.79    Good symptom control on Arava This issue adds complexity to her management    3. Hiatal hernia with GERD  K44.9    K21.9    Continue PPI and H2 blocker Continue follow-up with GI This issue adds complexity to her management    4. Cough, unspecified type  R05.9 Pulmonary Function Test [facility] Only   Markedly improved Occurs  only when she has a reflux episode Continue antireflux measures     Orders Placed This  Encounter  Procedures   CT CHEST HIGH RESOLUTION    Standing Status:   Future    Standing Expiration Date:   07/05/2023    Order Specific Question:   Preferred imaging location?    Answer:   [facility]   Pulmonary Function Test [facility] Only    Standing Status:   Future    Standing Expiration Date:   07/05/2023    Order Specific Question:   Full PFT: includes the following: basic spirometry, spirometry pre & post bronchodilator, diffusion capacity (DLCO), lung volumes    Answer:   Full PFT    Order Specific Question:   This test can only be performed at    Answer:   [facility]   Will reassess the patient's ILD.  We will see the patient in follow-up in 4 to 6 weeks time call sooner should any new problems arise.  Gailen Shelter, MD Advanced [facility]ch[facility]    *This note was dictated using voice recognition software/Dragon.  Despite best efforts to proofread, errors can occur which can change the meaning. Any transcriptional errors that result from this process are unintentional and may not be fully corrected at the time of dictation.

## 2022-07-05 NOTE — Patient Instructions (Signed)
We are going to reevaluate your lung process.  You will be scheduled for a high-resolution CT chest and also for breathing tests.  We will see you in follow-up in 4 to 6 weeks time call sooner should any new problems arise.

## 2022-07-12 ENCOUNTER — Ambulatory Visit: Payer: Medicare HMO | Attending: Physician Assistant | Admitting: Physician Assistant

## 2022-07-12 ENCOUNTER — Encounter: Payer: Self-pay | Admitting: Physician Assistant

## 2022-07-12 VITALS — BP 133/78 | HR 83 | Resp 16 | Ht 68.0 in | Wt 197.2 lb

## 2022-07-12 DIAGNOSIS — C50511 Malignant neoplasm of lower-outer quadrant of right female breast: Secondary | ICD-10-CM

## 2022-07-12 DIAGNOSIS — Z8709 Personal history of other diseases of the respiratory system: Secondary | ICD-10-CM | POA: Diagnosis not present

## 2022-07-12 DIAGNOSIS — L409 Psoriasis, unspecified: Secondary | ICD-10-CM | POA: Diagnosis not present

## 2022-07-12 DIAGNOSIS — J849 Interstitial pulmonary disease, unspecified: Secondary | ICD-10-CM

## 2022-07-12 DIAGNOSIS — M3500 Sicca syndrome, unspecified: Secondary | ICD-10-CM | POA: Diagnosis not present

## 2022-07-12 DIAGNOSIS — M0579 Rheumatoid arthritis with rheumatoid factor of multiple sites without organ or systems involvement: Secondary | ICD-10-CM

## 2022-07-12 DIAGNOSIS — M19071 Primary osteoarthritis, right ankle and foot: Secondary | ICD-10-CM

## 2022-07-12 DIAGNOSIS — Z8639 Personal history of other endocrine, nutritional and metabolic disease: Secondary | ICD-10-CM

## 2022-07-12 DIAGNOSIS — M170 Bilateral primary osteoarthritis of knee: Secondary | ICD-10-CM | POA: Diagnosis not present

## 2022-07-12 DIAGNOSIS — Z79899 Other long term (current) drug therapy: Secondary | ICD-10-CM | POA: Diagnosis not present

## 2022-07-12 DIAGNOSIS — M19072 Primary osteoarthritis, left ankle and foot: Secondary | ICD-10-CM

## 2022-07-12 DIAGNOSIS — Z170 Estrogen receptor positive status [ER+]: Secondary | ICD-10-CM

## 2022-07-12 DIAGNOSIS — Z8669 Personal history of other diseases of the nervous system and sense organs: Secondary | ICD-10-CM

## 2022-07-20 ENCOUNTER — Ambulatory Visit: Payer: Medicare HMO | Attending: Pulmonary Disease

## 2022-07-20 ENCOUNTER — Ambulatory Visit: Payer: Medicare HMO

## 2022-07-21 ENCOUNTER — Other Ambulatory Visit: Payer: Self-pay

## 2022-07-21 DIAGNOSIS — R059 Cough, unspecified: Secondary | ICD-10-CM

## 2022-07-21 DIAGNOSIS — J849 Interstitial pulmonary disease, unspecified: Secondary | ICD-10-CM

## 2022-07-21 NOTE — Addendum Note (Signed)
Addended by: Lajoyce Lauber A on: 07/21/2022 04:56 PM   Modules accepted: Orders

## 2022-07-21 NOTE — Addendum Note (Signed)
Addended by: Lajoyce Lauber A on: 07/21/2022 04:49 PM   Modules accepted: Orders

## 2022-07-29 ENCOUNTER — Ambulatory Visit
Admission: RE | Admit: 2022-07-29 | Discharge: 2022-07-29 | Disposition: A | Discharge status: Home / Self Care | Payer: Medicare HMO | Source: Ambulatory Visit | Attending: Pulmonary Disease | Admitting: Pulmonary Disease

## 2022-07-29 ENCOUNTER — Ambulatory Visit: Payer: Medicare HMO

## 2022-07-29 DIAGNOSIS — R160 Hepatomegaly, not elsewhere classified: Secondary | ICD-10-CM | POA: Diagnosis not present

## 2022-07-29 DIAGNOSIS — Z87891 Personal history of nicotine dependence: Secondary | ICD-10-CM | POA: Insufficient documentation

## 2022-07-29 DIAGNOSIS — J849 Interstitial pulmonary disease, unspecified: Secondary | ICD-10-CM

## 2022-07-29 DIAGNOSIS — J479 Bronchiectasis, uncomplicated: Secondary | ICD-10-CM | POA: Diagnosis not present

## 2022-07-29 DIAGNOSIS — R059 Cough, unspecified: Secondary | ICD-10-CM | POA: Diagnosis not present

## 2022-07-29 DIAGNOSIS — R918 Other nonspecific abnormal finding of lung field: Secondary | ICD-10-CM | POA: Diagnosis not present

## 2022-07-29 MED ORDER — ALBUTEROL SULFATE (2.5 MG/3ML) 0.083% IN NEBU
2.5000 mg | INHALATION_SOLUTION | Freq: Once | RESPIRATORY_TRACT | Status: AC
  Administered 2022-07-29: 2.5 mg via RESPIRATORY_TRACT
  Filled 2022-07-29: qty 3

## 2022-08-03 ENCOUNTER — Ambulatory Visit: Payer: Medicare HMO | Admitting: Pulmonary Disease

## 2022-08-03 LAB — PULMONARY FUNCTION TEST ARMC ONLY
DL/VA % pred: 72 %
DL/VA: 2.92 ml/min/mmHg/L
DLCO unc % pred: 43 %
DLCO unc: 9.82 ml/min/mmHg
FEF 25-75 Post: 2.9 L/sec
FEF 25-75 Pre: 3.23 L/sec
FEF2575-%Change-Post: -10 %
FEF2575-%Pred-Post: 129 %
FEF2575-%Pred-Pre: 144 %
FEV1-%Change-Post: 0 %
FEV1-%Pred-Post: 104 %
FEV1-%Pred-Pre: 105 %
FEV1-Post: 2.85 L
FEV1-Pre: 2.88 L
FEV1FVC-%Change-Post: 7 %
FEV1FVC-%Pred-Pre: 103 %
FEV6-%Change-Post: -6 %
FEV6-%Pred-Post: 96 %
FEV6-%Pred-Pre: 103 %
FEV6-Post: 3.31 L
FEV6-Pre: 3.54 L
FEV6FVC-%Pred-Post: 104 %
FEV6FVC-%Pred-Pre: 104 %
FVC-%Change-Post: -8 %
FVC-%Pred-Post: 93 %
FVC-%Pred-Pre: 101 %
FVC-Post: 3.33 L
FVC-Pre: 3.62 L
Post FEV1/FVC ratio: 86 %
Post FEV6/FVC ratio: 100 %
Pre FEV1/FVC ratio: 79 %
Pre FEV6/FVC Ratio: 100 %
RV % pred: 96 %
RV: 2.27 L
TLC % pred: 105 %
TLC: 5.99 L

## 2022-08-05 DIAGNOSIS — Z1231 Encounter for screening mammogram for malignant neoplasm of breast: Secondary | ICD-10-CM | POA: Diagnosis not present

## 2022-08-05 LAB — HM MAMMOGRAPHY

## 2022-08-06 ENCOUNTER — Telehealth: Payer: Self-pay | Admitting: Pulmonary Disease

## 2022-08-06 ENCOUNTER — Encounter: Payer: Self-pay | Admitting: Internal Medicine

## 2022-08-06 NOTE — Telephone Encounter (Signed)
Patient is returning phone call. Patient phone number is [phone].

## 2022-08-06 NOTE — Telephone Encounter (Signed)
Alphonzo Dublin, MD  Cheryl Knight A, CMA Her CT chest shows no change from the minimal scarring that has shown previously. These changes are likely related to her RA. We can discuss further on F/U visit. Her breathing tests were good overall.  Patient is aware of results and voiced her understanding. Nothing further needed.

## 2022-08-10 DIAGNOSIS — R928 Other abnormal and inconclusive findings on diagnostic imaging of breast: Secondary | ICD-10-CM | POA: Diagnosis not present

## 2022-08-10 DIAGNOSIS — R922 Inconclusive mammogram: Secondary | ICD-10-CM | POA: Diagnosis not present

## 2022-08-10 DIAGNOSIS — N63 Unspecified lump in unspecified breast: Secondary | ICD-10-CM | POA: Diagnosis not present

## 2022-08-10 DIAGNOSIS — Z1231 Encounter for screening mammogram for malignant neoplasm of breast: Secondary | ICD-10-CM | POA: Diagnosis not present

## 2022-08-11 ENCOUNTER — Encounter: Payer: Self-pay | Admitting: Internal Medicine

## 2022-08-12 ENCOUNTER — Telehealth: Payer: Self-pay

## 2022-08-12 NOTE — Telephone Encounter (Signed)
Faxed signed order to [facility] as per Dr. Mosetta Putt, received fax confirmation. Placed original in the to be scanned file.

## 2022-08-16 ENCOUNTER — Other Ambulatory Visit: Payer: Self-pay | Admitting: Radiology

## 2022-08-16 DIAGNOSIS — R922 Inconclusive mammogram: Secondary | ICD-10-CM | POA: Diagnosis not present

## 2022-08-16 DIAGNOSIS — D0511 Intraductal carcinoma in situ of right breast: Secondary | ICD-10-CM | POA: Diagnosis not present

## 2022-08-18 ENCOUNTER — Telehealth: Payer: Self-pay

## 2022-08-18 NOTE — Telephone Encounter (Signed)
Faxed signed orders as per Dr.Feng to [facility]. Received fax confirmation of receipt. Placed original in the to be scanned file.

## 2022-08-20 ENCOUNTER — Ambulatory Visit: Payer: Self-pay | Admitting: Surgery

## 2022-08-20 DIAGNOSIS — Z853 Personal history of malignant neoplasm of breast: Secondary | ICD-10-CM | POA: Diagnosis not present

## 2022-08-20 DIAGNOSIS — D0511 Intraductal carcinoma in situ of right breast: Secondary | ICD-10-CM

## 2022-08-20 NOTE — H&P (Signed)
PCP - [facility] - [facility] - Juanetta Gosling     Subjective    Chief Complaint: New Consultation (BREAST)       History of Present Illness: Cheryl Knight is a 67 y.o. female who is seen today as an office consultation at the request of Dr. Juanetta Gosling for evaluation of New Consultation (BREAST) .   This is a 67 year old female who was diagnosed in 02/2019 with Grade 1 invasive ductal carcinoma of the right breast.  Her tumor was located at 0700. ER/PR positive, her 2 negative.  She underwent right RSL/ SLNB on 03/28/19.  Margins were negative/ SLNx 2 negative.  She received radiation therapy and is currently on Tamoxifen.     Recently she had a mammogram that revealed a focal asymmetry in the right breast at 12:00.  Work-up revealed a 1.4 x 0.8 x 1.4 cm mass 5-6 cmfn.  Biopsy revealed DCIS high-grade/ ER positive/ PR negative with suspicion for microinvasion.  She presents now to discuss treatment options.  Her last oncology appointment was in February.   Review of Systems: A complete review of systems was obtained from the patient.  I have reviewed this information and discussed as appropriate with the patient.  See HPI as well for other ROS.   Review of Systems  Constitutional: Negative.   HENT:  Positive for congestion.   Eyes:  Positive for redness.  Respiratory: Negative.    Cardiovascular: Negative.   Gastrointestinal:  Positive for constipation and diarrhea.  Genitourinary: Negative.   Musculoskeletal: Negative.   Skin: Negative.   Neurological:  Positive for headaches.  Endo/Heme/Allergies: Negative.   Psychiatric/Behavioral: Negative.          Medical History: Past Medical History      Past Medical History:  Diagnosis Date   Arthritis     Diabetes mellitus without complication (CMS/HHS-HCC)     GERD (gastroesophageal reflux disease)     History of cancer     Hyperlipidemia     Hypertension     Thyroid disease          Problem List      Patient Active Problem List  Diagnosis   Aortic atherosclerosis (CMS-HCC)   Basal cell carcinoma of chest wall   Bronchiectasis without complication (CMS/HHS-HCC)   Chronic back pain   Esophageal reflux   Fatty liver   Glaucoma   Hyperlipidemia   Hypertension associated with diabetes  (CMS/HHS-HCC)   ILD (interstitial lung disease) (CMS/HHS-HCC)   Malignant neoplasm of lower-outer quadrant of right breast of female, estrogen receptor positive (CMS/HHS-HCC)   Obesity (BMI 30-39.9), unspecified   Migraine   Osteoarthritis   Pulmonary fibrosis (CMS/HHS-HCC)   Rheumatoid arthritis (CMS/HHS-HCC)   Type 2 diabetes mellitus with hyperglycemia, without long-term current use of insulin (CMS/HHS-HCC)   History of right breast cancer   Ductal carcinoma in situ (DCIS) of right breast        Past Surgical History       Past Surgical History:  Procedure Laterality Date   COLONOSCOPY   08/14/2019    Tubular adenoma/Repeat 3yrs/TKT   EGD   08/14/2019    Gastritis/Esophagitis/No Repeat/TKT   CHOLECYSTECTOMY   10/2019    robotic assited laparoscopic by isami sakai        Allergies      Allergies  Allergen Reactions   Demerol [Meperidine] Nausea and Vomiting   Lisinopril Cough   Tylox [Oxycodone-Acetaminophen] Hallucination  Medications Ordered Prior to Encounter        Current Outpatient Medications on File Prior to Visit  Medication Sig Dispense Refill   albuterol 90 mcg/actuation inhaler INHALE 1 TO 2 PUFFS INTO THE LUNGS EVERY 6 HOURS AS NEEDED FOR WHEEZING OR SHORTNESS OF BREATH       calcium carbonate-vitamin D3 (CALTRATE 600+D) 600 mg(1,500mg ) -400 unit Chew chewable tablet Take 1 tablet by mouth once daily          esomeprazole (NEXIUM) 40 MG DR capsule Take 1 capsule (40 mg total) by mouth once daily 90 capsule 2   famotidine (PEPCID) 20 MG tablet Take 20 mg by mouth 2 (two) times daily       glucosamine su 2KCl-chondroit 500-400 mg Tab Take 1 tablet by mouth 3  (three) times a day          leflunomide (ARAVA) 20 MG tablet Take 20 mg by mouth once daily          levothyroxine (SYNTHROID) 100 MCG tablet Take 100 mcg by mouth once daily          MIEBO, PF, 100 % Drop Apply to eye       olmesartan-hydrochlorothiazide (BENICAR HCT) 20-12.5 mg tablet Take 1 tablet by mouth every morning       promethazine (PHENERGAN) 25 MG tablet Take 25 mg by mouth every 6 (six) hours as needed for Nausea       simvastatin (ZOCOR) 40 MG tablet Take by mouth       SUMAtriptan (IMITREX) 50 MG tablet TAKE 1 TABLET BY MOUTH AS NEEDED FOR HEADACHE       tamoxifen (NOLVADEX) 20 MG tablet          topiramate (TOPAMAX) 100 MG tablet Take 100 mg by mouth once daily          betamethasone dipropionate (DIPROSONE) 0.05 % cream Apply topically 2 (two) times daily (Patient not taking: Reported on 08/20/2022)       budesonide-formoteroL (SYMBICORT) 160-4.5 mcg/actuation inhaler INHALE 2 PUFFS INTO THE LUNGS TWICE DAILY. RINSE MOUTH (Patient not taking: Reported on 08/20/2022)       cetirizine (ZYRTEC) 10 MG tablet Take 10 mg by mouth once daily (Patient not taking: Reported on 08/20/2022)       cyclobenzaprine (FLEXERIL) 10 MG tablet Take 10 mg by mouth once daily    (Patient not taking: Reported on 08/20/2022)       docosahexaenoic acid-epa 120-180 mg Cap Take 1 capsule by mouth once daily    (Patient not taking: Reported on 08/20/2022)       fluticasone propionate (FLONASE) 50 mcg/actuation nasal spray Place 2 sprays into both nostrils once daily    (Patient not taking: Reported on 08/20/2022)       HYDROcodone-acetaminophen (NORCO) 5-325 mg tablet TAKE 1 TABLET BY MOUTH EVERY 6 HOURS AS NEEDED FOR MODERATE PAIN (Patient not taking: Reported on 08/20/2022)       nystatin (MYCOSTATIN) 100,000 unit/mL suspension  (Patient not taking: Reported on 11/06/2019)       pilocarpine (SALAGEN) 5 mg tablet Take 5 mg by mouth 3 (three) times daily (Patient not taking: Reported on 11/06/2019)       pravastatin  (PRAVACHOL) 40 MG tablet Take 20 mg by mouth nightly    (Patient not taking: Reported on 08/20/2022)       RESTASIS 0.05 % ophthalmic emulsion Place 1 drop into both eyes 2 (two) times daily  traMADoL (ULTRAM) 50 mg tablet TAKE 2 TABLETS BY MOUTH TWICE DAILY AS NEEDED FOR PAIN RELIEF (Patient not taking: Reported on 08/20/2022)        No current facility-administered medications on file prior to visit.        Family History       Family History  Problem Relation Age of Onset   Skin cancer Mother     Obesity Mother     High blood pressure (Hypertension) Mother     Hyperlipidemia (Elevated cholesterol) Mother     Diabetes Mother     Breast cancer Mother          Tobacco Use History  Social History        Tobacco Use  Smoking Status Former   Types: Cigarettes  Smokeless Tobacco Never        Social History  Social History         Socioeconomic History   Marital status: Married  Tobacco Use   Smoking status: Former      Types: Cigarettes   Smokeless tobacco: Never  Vaping Use   Vaping status: Never Used  Substance and Sexual Activity   Alcohol use: Yes   Drug use: Never    Social Determinants of Health        Financial Resource Strain: Low Risk  (10/26/2021)    Received from [facility]    Overall Financial Resource Strain (CARDIA)     Difficulty of Paying Living Expenses: Not hard at all  Food Insecurity: No Food Insecurity (10/26/2021)    Received from [facility]    Hunger Vital Sign     Worried About Running Out of Food in the Last Year: Never true     Ran Out of Food in the Last Year: Never true  Transportation Needs: No Transportation Needs (10/26/2021)    Received from [facility] - Transportation     Lack of Transportation (Medical): No     Lack of Transportation (Non-Medical): No  Stress: No Stress Concern Present (10/26/2021)    Received from [facility] - Occupational Stress Questionnaire      Feeling of Stress : Not at all  Social Connections: Unknown (10/26/2021)    Received from [facility]    Social Connection and Isolation Panel [NHANES]     Marital Status: Married        Objective:         Vitals:    08/20/22 1120  BP: 125/83  Pulse: 101  Temp: 36.9 C (98.4 F)  SpO2: 96%  Weight: 88.8 kg (195 lb 12.8 oz)  Height: 172.7 cm (5\' 8" )  PainSc: 0-No pain    Body mass index is 29.77 kg/m.   Physical Exam    Constitutional:  WDWN in NAD, conversant, no obvious deformities; lying in bed comfortably Eyes:  Pupils equal, round; sclera anicteric; moist conjunctiva; no lid lag HENT:  Oral mucosa moist; good dentition  Neck:  No masses palpated, trachea midline; no thyromegaly Lungs:  CTA bilaterally; normal respiratory effort Breasts:  symmetric, no nipple changes; healed right breast incision and axillary incision.  No palpable masses or lymphadenopathy on either side CV:  Regular rate and rhythm; no murmurs; extremities well-perfused with no edema Abd:  +bowel sounds, soft, non-tender, no palpable organomegaly; no palpable hernias Musc:  Unable to assess gait; no apparent clubbing or cyanosis in extremities Lymphatic:  No palpable cervical or axillary  lymphadenopathy Skin:  Warm, dry; no sign of jaundice Psychiatric - alert and oriented x 4; calm mood and affect     Labs, Imaging and Diagnostic Testing: See discussion in HPI   Assessment and Plan:  Diagnoses and all orders for this visit:   History of right breast cancer -     Ambulatory Referral to Plastic Surgery   Ductal carcinoma in situ (DCIS) of right breast -     Ambulatory Referral to Plastic Surgery     I had a long discussion with the patient and her husband about her new diagnosis.  It is concerning that she developed another separate breast cancer while still on Tamoxifen.  This cancer appears to different and distinct from her previous cancer.  She did receive radiation with the last treatment  and has CT evidence of bronchiectasis.     We discussed surgical options including mastectomy with or without reconstruction.  She would like to explore the possibility of immediate reconstruction, although her previous radiation to the right chest may limit the options.  She wants to proceed with planning for right mastectomy with sentinel lymph node biopsy (due to suspicion for microinvasion) while pursuing a Plastics consultation.     The surgical procedure has been discussed with the patient.  Potential risks, benefits, alternative treatments, and expected outcomes have been explained.  All of the patient's questions at this time have been answered.  The likelihood of reaching the patient's treatment goal is good.  The patient understands the proposed surgical procedure and wishes to proceed.    Delbert Harness, MD  08/20/2022 7:25 PM

## 2022-08-20 NOTE — H&P (View-Only) (Signed)
 PCP - [facility] - [facility] - Juanetta Gosling     Subjective    Chief Complaint: New Consultation (BREAST)       History of Present Illness: Cheryl Knight is a 67 y.o. female who is seen today as an office consultation at the request of Dr. Juanetta Gosling for evaluation of New Consultation (BREAST) .   This is a 67 year old female who was diagnosed in 02/2019 with Grade 1 invasive ductal carcinoma of the right breast.  Her tumor was located at 0700. ER/PR positive, her 2 negative.  She underwent right RSL/ SLNB on 03/28/19.  Margins were negative/ SLNx 2 negative.  She received radiation therapy and is currently on Tamoxifen.     Recently she had a mammogram that revealed a focal asymmetry in the right breast at 12:00.  Work-up revealed a 1.4 x 0.8 x 1.4 cm mass 5-6 cmfn.  Biopsy revealed DCIS high-grade/ ER positive/ PR negative with suspicion for microinvasion.  She presents now to discuss treatment options.  Her last oncology appointment was in February.   Review of Systems: A complete review of systems was obtained from the patient.  I have reviewed this information and discussed as appropriate with the patient.  See HPI as well for other ROS.   Review of Systems  Constitutional: Negative.   HENT:  Positive for congestion.   Eyes:  Positive for redness.  Respiratory: Negative.    Cardiovascular: Negative.   Gastrointestinal:  Positive for constipation and diarrhea.  Genitourinary: Negative.   Musculoskeletal: Negative.   Skin: Negative.   Neurological:  Positive for headaches.  Endo/Heme/Allergies: Negative.   Psychiatric/Behavioral: Negative.          Medical History: Past Medical History      Past Medical History:  Diagnosis Date   Arthritis     Diabetes mellitus without complication (CMS/HHS-HCC)     GERD (gastroesophageal reflux disease)     History of cancer     Hyperlipidemia     Hypertension     Thyroid disease          Problem List      Patient Active Problem List  Diagnosis   Aortic atherosclerosis (CMS-HCC)   Basal cell carcinoma of chest wall   Bronchiectasis without complication (CMS/HHS-HCC)   Chronic back pain   Esophageal reflux   Fatty liver   Glaucoma   Hyperlipidemia   Hypertension associated with diabetes  (CMS/HHS-HCC)   ILD (interstitial lung disease) (CMS/HHS-HCC)   Malignant neoplasm of lower-outer quadrant of right breast of female, estrogen receptor positive (CMS/HHS-HCC)   Obesity (BMI 30-39.9), unspecified   Migraine   Osteoarthritis   Pulmonary fibrosis (CMS/HHS-HCC)   Rheumatoid arthritis (CMS/HHS-HCC)   Type 2 diabetes mellitus with hyperglycemia, without long-term current use of insulin (CMS/HHS-HCC)   History of right breast cancer   Ductal carcinoma in situ (DCIS) of right breast        Past Surgical History       Past Surgical History:  Procedure Laterality Date   COLONOSCOPY   08/14/2019    Tubular adenoma/Repeat 3yrs/TKT   EGD   08/14/2019    Gastritis/Esophagitis/No Repeat/TKT   CHOLECYSTECTOMY   10/2019    robotic assited laparoscopic by isami sakai        Allergies      Allergies  Allergen Reactions   Demerol [Meperidine] Nausea and Vomiting   Lisinopril Cough   Tylox [Oxycodone-Acetaminophen] Hallucination  Medications Ordered Prior to Encounter        Current Outpatient Medications on File Prior to Visit  Medication Sig Dispense Refill   albuterol 90 mcg/actuation inhaler INHALE 1 TO 2 PUFFS INTO THE LUNGS EVERY 6 HOURS AS NEEDED FOR WHEEZING OR SHORTNESS OF BREATH       calcium carbonate-vitamin D3 (CALTRATE 600+D) 600 mg(1,500mg ) -400 unit Chew chewable tablet Take 1 tablet by mouth once daily          esomeprazole (NEXIUM) 40 MG DR capsule Take 1 capsule (40 mg total) by mouth once daily 90 capsule 2   famotidine (PEPCID) 20 MG tablet Take 20 mg by mouth 2 (two) times daily       glucosamine su 2KCl-chondroit 500-400 mg Tab Take 1 tablet by mouth 3  (three) times a day          leflunomide (ARAVA) 20 MG tablet Take 20 mg by mouth once daily          levothyroxine (SYNTHROID) 100 MCG tablet Take 100 mcg by mouth once daily          MIEBO, PF, 100 % Drop Apply to eye       olmesartan-hydrochlorothiazide (BENICAR HCT) 20-12.5 mg tablet Take 1 tablet by mouth every morning       promethazine (PHENERGAN) 25 MG tablet Take 25 mg by mouth every 6 (six) hours as needed for Nausea       simvastatin (ZOCOR) 40 MG tablet Take by mouth       SUMAtriptan (IMITREX) 50 MG tablet TAKE 1 TABLET BY MOUTH AS NEEDED FOR HEADACHE       tamoxifen (NOLVADEX) 20 MG tablet          topiramate (TOPAMAX) 100 MG tablet Take 100 mg by mouth once daily          betamethasone dipropionate (DIPROSONE) 0.05 % cream Apply topically 2 (two) times daily (Patient not taking: Reported on 08/20/2022)       budesonide-formoteroL (SYMBICORT) 160-4.5 mcg/actuation inhaler INHALE 2 PUFFS INTO THE LUNGS TWICE DAILY. RINSE MOUTH (Patient not taking: Reported on 08/20/2022)       cetirizine (ZYRTEC) 10 MG tablet Take 10 mg by mouth once daily (Patient not taking: Reported on 08/20/2022)       cyclobenzaprine (FLEXERIL) 10 MG tablet Take 10 mg by mouth once daily    (Patient not taking: Reported on 08/20/2022)       docosahexaenoic acid-epa 120-180 mg Cap Take 1 capsule by mouth once daily    (Patient not taking: Reported on 08/20/2022)       fluticasone propionate (FLONASE) 50 mcg/actuation nasal spray Place 2 sprays into both nostrils once daily    (Patient not taking: Reported on 08/20/2022)       HYDROcodone-acetaminophen (NORCO) 5-325 mg tablet TAKE 1 TABLET BY MOUTH EVERY 6 HOURS AS NEEDED FOR MODERATE PAIN (Patient not taking: Reported on 08/20/2022)       nystatin (MYCOSTATIN) 100,000 unit/mL suspension  (Patient not taking: Reported on 11/06/2019)       pilocarpine (SALAGEN) 5 mg tablet Take 5 mg by mouth 3 (three) times daily (Patient not taking: Reported on 11/06/2019)       pravastatin  (PRAVACHOL) 40 MG tablet Take 20 mg by mouth nightly    (Patient not taking: Reported on 08/20/2022)       RESTASIS 0.05 % ophthalmic emulsion Place 1 drop into both eyes 2 (two) times daily  traMADoL (ULTRAM) 50 mg tablet TAKE 2 TABLETS BY MOUTH TWICE DAILY AS NEEDED FOR PAIN RELIEF (Patient not taking: Reported on 08/20/2022)        No current facility-administered medications on file prior to visit.        Family History       Family History  Problem Relation Age of Onset   Skin cancer Mother     Obesity Mother     High blood pressure (Hypertension) Mother     Hyperlipidemia (Elevated cholesterol) Mother     Diabetes Mother     Breast cancer Mother          Tobacco Use History  Social History        Tobacco Use  Smoking Status Former   Types: Cigarettes  Smokeless Tobacco Never        Social History  Social History         Socioeconomic History   Marital status: Married  Tobacco Use   Smoking status: Former      Types: Cigarettes   Smokeless tobacco: Never  Vaping Use   Vaping status: Never Used  Substance and Sexual Activity   Alcohol use: Yes   Drug use: Never    Social Determinants of Health        Financial Resource Strain: Low Risk  (10/26/2021)    Received from [facility]    Overall Financial Resource Strain (CARDIA)     Difficulty of Paying Living Expenses: Not hard at all  Food Insecurity: No Food Insecurity (10/26/2021)    Received from [facility]    Hunger Vital Sign     Worried About Running Out of Food in the Last Year: Never true     Ran Out of Food in the Last Year: Never true  Transportation Needs: No Transportation Needs (10/26/2021)    Received from [facility] - [facility]     Lack of Transportation (Medical): No     Lack of Transportation (Non-Medical): No  Stress: No Stress Concern Present (10/26/2021)    Received from [facility] - Occupational Stress Questionnaire      Feeling of Stress : Not at all  Social Connections: Unknown (10/26/2021)    Received from [facility]    Social Connection and Isolation Panel [NHANES]     Marital Status: Married        Objective:         Vitals:    08/20/22 1120  BP: 125/83  Pulse: 101  Temp: 36.9 C (98.4 F)  SpO2: 96%  Weight: 88.8 kg (195 lb 12.8 oz)  Height: 172.7 cm (5\' 8" )  PainSc: 0-No pain    Body mass index is 29.77 kg/m.   Physical Exam    Constitutional:  WDWN in NAD, conversant, no obvious deformities; lying in bed comfortably Eyes:  Pupils equal, round; sclera anicteric; moist conjunctiva; no lid lag HENT:  Oral mucosa moist; good dentition  Neck:  No masses palpated, trachea midline; no thyromegaly Lungs:  CTA bilaterally; normal respiratory effort Breasts:  symmetric, no nipple changes; healed right breast incision and axillary incision.  No palpable masses or lymphadenopathy on either side CV:  Regular rate and rhythm; no murmurs; extremities well-perfused with no edema Abd:  +bowel sounds, soft, non-tender, no palpable organomegaly; no palpable hernias Musc:  Unable to assess gait; no apparent clubbing or cyanosis in extremities Lymphatic:  No palpable cervical or axillary  lymphadenopathy Skin:  Warm, dry; no sign of jaundice Psychiatric - alert and oriented x 4; calm mood and affect     Labs, Imaging and Diagnostic Testing: See discussion in HPI   Assessment and Plan:  Diagnoses and all orders for this visit:   History of right breast cancer -     Ambulatory Referral to Plastic Surgery   Ductal carcinoma in situ (DCIS) of right breast -     Ambulatory Referral to Plastic Surgery     I had a long discussion with the patient and her husband about her new diagnosis.  It is concerning that she developed another separate breast cancer while still on Tamoxifen.  This cancer appears to different and distinct from her previous cancer.  She did receive radiation with the last treatment  and has CT evidence of bronchiectasis.     We discussed surgical options including mastectomy with or without reconstruction.  She would like to explore the possibility of immediate reconstruction, although her previous radiation to the right chest may limit the options.  She wants to proceed with planning for right mastectomy with sentinel lymph node biopsy (due to suspicion for microinvasion) while pursuing a Plastics consultation.     The surgical procedure has been discussed with the patient.  Potential risks, benefits, alternative treatments, and expected outcomes have been explained.  All of the patient's questions at this time have been answered.  The likelihood of reaching the patient's treatment goal is good.  The patient understands the proposed surgical procedure and wishes to proceed.   MATTHEW Delbert Harness, MD  08/20/2022 7:25 PM

## 2022-08-23 ENCOUNTER — Telehealth: Payer: Self-pay | Admitting: *Deleted

## 2022-08-23 ENCOUNTER — Encounter: Payer: Self-pay | Admitting: *Deleted

## 2022-08-23 ENCOUNTER — Encounter: Payer: Self-pay | Admitting: Hematology

## 2022-08-23 NOTE — Telephone Encounter (Signed)
Patient contacted the office stating that she recently had a mammogram and her results showed secondary right breast cancer. Patient states she is going to be scheduled for a mastectomy. Patient states is is very small and not invasive. Patient states they have not discussed her needing any radiation or chemotherapy.   Per Dr. Corliss Skains, patient will need to hold Arava 1 week prior to surgery and 2 weeks after.   Advised patient and sent letter to surgeon for surgery clearance.

## 2022-08-25 ENCOUNTER — Telehealth: Payer: Self-pay

## 2022-08-25 DIAGNOSIS — D0511 Intraductal carcinoma in situ of right breast: Secondary | ICD-10-CM | POA: Diagnosis not present

## 2022-08-25 DIAGNOSIS — Z853 Personal history of malignant neoplasm of breast: Secondary | ICD-10-CM | POA: Diagnosis not present

## 2022-08-25 DIAGNOSIS — Z923 Personal history of irradiation: Secondary | ICD-10-CM | POA: Diagnosis not present

## 2022-08-25 NOTE — Telephone Encounter (Signed)
Pt called stating that she is having surgery d/t 2nd rt breast cancer.  Pt stated she wasn't sure if Dr. Mosetta Putt was aware and needed to speak with Dr. Mosetta Putt regarding her upcoming surgery.  Pt said she's seeing Dr. Corliss Skains.  Pt requested if Dr. Mosetta Putt would please give her a call.

## 2022-08-26 ENCOUNTER — Telehealth: Payer: Medicare HMO | Admitting: Hematology

## 2022-08-26 ENCOUNTER — Inpatient Hospital Stay: Payer: Medicare HMO | Attending: Hematology | Admitting: Hematology

## 2022-08-26 DIAGNOSIS — D0511 Intraductal carcinoma in situ of right breast: Secondary | ICD-10-CM | POA: Diagnosis not present

## 2022-08-26 DIAGNOSIS — Z170 Estrogen receptor positive status [ER+]: Secondary | ICD-10-CM

## 2022-08-26 DIAGNOSIS — K219 Gastro-esophageal reflux disease without esophagitis: Secondary | ICD-10-CM | POA: Insufficient documentation

## 2022-08-26 DIAGNOSIS — Z79810 Long term (current) use of selective estrogen receptor modulators (SERMs): Secondary | ICD-10-CM | POA: Diagnosis not present

## 2022-08-26 DIAGNOSIS — I10 Essential (primary) hypertension: Secondary | ICD-10-CM | POA: Insufficient documentation

## 2022-08-26 DIAGNOSIS — C50511 Malignant neoplasm of lower-outer quadrant of right female breast: Secondary | ICD-10-CM

## 2022-08-26 DIAGNOSIS — Z853 Personal history of malignant neoplasm of breast: Secondary | ICD-10-CM | POA: Insufficient documentation

## 2022-08-26 DIAGNOSIS — E7800 Pure hypercholesterolemia, unspecified: Secondary | ICD-10-CM | POA: Insufficient documentation

## 2022-08-26 DIAGNOSIS — Z923 Personal history of irradiation: Secondary | ICD-10-CM | POA: Insufficient documentation

## 2022-08-26 DIAGNOSIS — Z79899 Other long term (current) drug therapy: Secondary | ICD-10-CM | POA: Insufficient documentation

## 2022-08-26 DIAGNOSIS — E039 Hypothyroidism, unspecified: Secondary | ICD-10-CM | POA: Insufficient documentation

## 2022-08-26 DIAGNOSIS — M069 Rheumatoid arthritis, unspecified: Secondary | ICD-10-CM | POA: Insufficient documentation

## 2022-08-26 NOTE — Progress Notes (Signed)
[facility]   [phone] [phone]   Clinic Follow up Note   Patient Care Team: Dana Allan, MD as PCP - General (Family Medicine) Dorothy Puffer, MD as Consulting Physician (Radiation Oncology) Malachy Mood, MD as Consulting Physician (Hematology) Manus Rudd, MD as Consulting Physician (General Surgery) Pershing Proud, RN as Oncology Nurse Navigator Donnelly Angelica, RN as Oncology Nurse Navigator Pollyann Samples, NP as Nurse Practitioner (Nurse Practitioner) 08/26/2022  I connected with Cheryl Knight on 08/26/22 at  4:00 PM EDT by telephone and verified that I am speaking with the correct person using two identifiers.   I discussed the limitations, risks, security and privacy concerns of performing an evaluation and management service by telephone and the availability of in person appointments. I also discussed with the patient that there may be a patient responsible charge related to this service. The patient expressed understanding and agreed to proceed.   Patient's location:  Home  Provider's location:  Office    CHIEF COMPLAINT: Newly diagnosed right breast DCIS   CURRENT THERAPY: Tamoxifen  ASSESSMENT & PLAN:   Right breast DCIS, ER+/PR- -discovered on screening mammogram in July 2024, showed asymmetry in the right breast.  Diagnostic mammogram and ultrasound showed a 1.4 cm mass at the 12 o'clock position 5 cm from nipple, biopsy confirmed high-grade DCIS, ER positive and PR negative. -Due to her previous radiation in the same breast, mastectomy was recommended.  She has seen her surgeon Dr. Corliss Skains, and agrees to proceed with right mastectomy.  She also saw plastic surgeon Dr. Leta Baptist, and has decided to forego reconstruction. -I discussed with her that DCIS is not invasive, no additional therapy needed after her mastectomy. -She is on tamoxifen, due to the small risk of thrombosis, I recommend her to stop tamoxifen 2 weeks before  surgery, and restart 3 to 4 weeks after surgery.  2. Malignant neoplasm of lower-outer quadrant of right breast, Stage IA, p(T1bN0M0), ER/PR+, HER2-, Grade I -Diagnosed in 02/2019, s/p right lumpectomy with SLNB on 03/28/19 by Dr Corliss Skains. Her path showed 0.9 cm mass complete resected with clear margins, node negative. She completed adjuvant radiation.  -She started antiestrogen therapy with Tamoxifen 20mg  once daily in 06/2019. tolerating well overall. Will continue for at least 5 years.     Plan -I reviewed her recent mammogram and the biopsy results, agree with mastectomy. -She will hold tamoxifen 2 weeks before surgery, and restart 3 to 4 weeks after surgery -I will see her back in February 2025 as scheduled.  She knows to call me if she needs to be seen sooner  SUMMARY OF ONCOLOGIC HISTORY: Oncology History Overview Note  Cancer Staging Malignant neoplasm of lower-outer quadrant of right breast of female, estrogen receptor positive (HCC) Staging form: Breast, AJCC 8th Edition - Clinical stage from 03/14/2019: Stage IA (cT1a, cN0, cM0, G1, ER+, PR+, HER2-) - Signed by Malachy Mood, MD on 03/20/2019 - Pathologic stage from 03/28/2019: Stage IA (pT1b, pN0(sn), cM0, G1, ER+, PR+, HER2-) - Unsigned    Malignant neoplasm of lower-outer quadrant of right breast of female, estrogen receptor positive (HCC)  02/22/2019 Imaging   Bone Density Scan  02/22/19 DEXA shows osteopenia with lowest T-score -1.4 at left hip   03/09/2019 Mammogram   Diagnostic Mammogram 03/09/19 IMPRESSION the 5mm mass in 7:00 posiiton of right breast suspicous of malignancy.     03/14/2019 Cancer Staging   Staging form: Breast, AJCC 8th Edition - Clinical stage from 03/14/2019: Stage IA (  cT1a, cN0, cM0, G1, ER+, PR+, HER2-) - Signed by Malachy Mood, MD on 03/20/2019   03/14/2019 Initial Biopsy   Diagnosis 03/14/19  Breast, right, needle core biopsy, 7 o'clock, 5cmfn - INVASIVE DUCTAL CARCINOMA. SEE NOTE Diagnosis Note Carcinoma  measures 0.4 cm in greatest linear dimension and appears grade 1. Dr. Rayetta Pigg reviewed the case and concurs with the diagnosis. A breast prognostic profile (ER, PR, Ki-67 and HER2) is pending and will be reported in an addendum. Dr. Yolanda Bonine was notified on 11/12/2019.   03/14/2019 Receptors her2   PROGNOSTIC INDICATORS Results: IMMUNOHISTOCHEMICAL AND MORPHOMETRIC ANALYSIS PERFORMED MANUALLY The tumor cells are EQUIVOCAL for Her2 (2+). Her2 by FISH will be performed and results reported separately. Estrogen Receptor: 95%, POSITIVE, STRONG STAINING INTENSITY Progesterone Receptor: 90%, POSITIVE, MODERATE STAINING INTENSITY Proliferation Marker Ki67: 10%   03/19/2019 Initial Diagnosis   Malignant neoplasm of lower-outer quadrant of right breast of female, estrogen receptor positive (HCC)   03/28/2019 Surgery   RIGHT BREAST LUMPECTOMY WITH RADIOACTIVE SEED AND SENTINEL LYMPH NODE BIOPSY by Dr Corliss Skains    03/28/2019 Pathology Results   FINAL MICROSCOPIC DIAGNOSIS:   A. BREAST, RIGHT, LUMPECTOMY:  - Invasive ductal carcinoma, 0.9 cm, Nottingham grade 1 of 3.  - Ductal carcinoma in situ, intermediate nuclear grade.  - Margins of resection are not involved.       - Invasive carcinoma, closest margin: 3 mm, superior.       - In situ carcinoma, closest margin: 3 mm, medial.  - Biopsy site.  - See oncology table.   B. SENTINEL LYMPH NODE, RIGHT AXILLARY #1, BIOPSY:  - One lymph node, negative for carcinoma (0/1).   C. SENTINEL LYMPH NODE, RIGHT AXILLARY #2, BIOPSY:  - One lymph node, negative for carcinoma (0/1).    03/28/2019 Cancer Staging   Staging form: Breast, AJCC 8th Edition - Pathologic stage from 03/28/2019: Stage IA (pT1b, pN0(sn), cM0, G1, ER+, PR+, HER2-) - Signed by Malachy Mood, MD on 06/03/2019   04/26/2019 Genetic Testing   Negative genetic testing:  No pathogenic variants detected on the [facility] Common Hereditary Cancers panel. The report date is 04/26/2019.  The Common Hereditary  Cancers Panel offered by [facility] includes sequencing and/or deletion duplication testing of the following 48 genes: APC, ATM, AXIN2, BARD1, BMPR1A, BRCA1, BRCA2, BRIP1, CDH1, CDK4, CDKN2A (p14ARF), CDKN2A (p16INK4a), CHEK2, CTNNA1, DICER1, EPCAM (Deletion/duplication testing only), GREM1 (promoter region deletion/duplication testing only), KIT, MEN1, MLH1, MSH2, MSH3, MSH6, MUTYH, NBN, NF1, NHTL1, PALB2, PDGFRA, PMS2, POLD1, POLE, PTEN, RAD50, RAD51C, RAD51D, RNF43, SDHB, SDHC, SDHD, SMAD4, SMARCA4. STK11, TP53, TSC1, TSC2, and VHL.  The following genes were evaluated for sequence changes only: SDHA and HOXB13 c.251G>A variant only.    05/08/2019 - 06/04/2019 Radiation Therapy   Adjuvant Radiation with Dr Mitzi Hansen    06/2019 -  Anti-estrogen oral therapy   Tamoxifen starting in 06/2019    09/04/2019 Survivorship   SCP delivered by Santiago Glad, NP      INTERVAL HISTORY: Pt is called for a phone visit due to her recently diagnosed right breast DCIS.  This was discussed that on her screening mammogram.  She has seen her surgeon Dr. Corliss Skains and agrees to proceed with right mastectomy.  She called me with several questions, I answered for her.  She is doing well, no complaints.   REVIEW OF SYSTEMS:   Constitutional: Denies fevers, chills or abnormal weight loss Eyes: Denies blurriness of vision Ears, nose, mouth, throat, and face: Denies mucositis or sore throat Respiratory:  Denies cough, dyspnea or wheezes Cardiovascular: Denies palpitation, chest discomfort or lower extremity swelling Gastrointestinal:  Denies nausea, heartburn or change in bowel habits Skin: Denies abnormal skin rashes Lymphatics: Denies new lymphadenopathy or easy bruising Neurological:Denies numbness, tingling or new weaknesses Behavioral/Psych: Mood is stable, no new changes  All other systems were reviewed with the patient and are negative.  MEDICAL HISTORY:  Past Medical History:  Diagnosis Date   Cancer (HCC) 02/2019    right breast IDC   Cataracts, bilateral    tbd surgery 2021   Complication of anesthesia    Cough    Difficult intubation    TRACH AT 19   Dry eye syndrome    Dyspnea    W/ COUGHING   Family history of breast cancer    Family history of lung cancer    Family history of prostate cancer    Family history of skin cancer    GERD (gastroesophageal reflux disease)    Glaucoma    stable glaucoma suspect 01/19/21 [facility]   Headache    High cholesterol    History of hiatal hernia    Hypertension    Hypothyroidism    Irritable bowel    Osteoarthritis    i.e right knee    PONV (postoperative nausea and vomiting)    Pre-diabetes    Retinopathy    htn 01/19/21 [facility]   Rheumatoid arthritis (HCC)    Thyroid disease     SURGICAL HISTORY: Past Surgical History:  Procedure Laterality Date   ABDOMINAL HYSTERECTOMY     age 67 ovaries out   BREAST LUMPECTOMY WITH RADIOACTIVE SEED AND SENTINEL LYMPH NODE BIOPSY Right 03/28/2019   Procedure: RIGHT BREAST LUMPECTOMY WITH RADIOACTIVE SEED AND SENTINEL LYMPH NODE BIOPSY;  Surgeon: Manus Rudd, MD;  Location: [facility];  Service: General;  Laterality: Right;  PEC BLOCK FOR POST OP PAIN   BREAST SURGERY     CATARACT EXTRACTION, BILATERAL     CHOLECYSTECTOMY     10/2019   ESOPHAGEAL DILATION     Dr. Juanda Chance 15 years from 2021   SKIN BIOPSY     04/2022, right arm, left leg   TONSILLECTOMY     TUBAL LIGATION      I have reviewed the social history and family history with the patient and they are unchanged from previous note.  ALLERGIES:  is allergic to pentazocine lactate, demerol [meperidine], and lisinopril.  MEDICATIONS:  Current Outpatient Medications  Medication Sig Dispense Refill   amLODipine (NORVASC) 2.5 MG tablet TAKE 1 TABLET(2.5 MG) BY MOUTH DAILY 90 tablet 3   Ascorbic Acid (VITAMIN C PO) Take 250 mg by mouth daily.     CALTRATE 600+D3 SOFT 600-20 MG-MCG CHEW daily.     esomeprazole (NEXIUM) 40 MG  capsule TAKE 1 CAPSULE(40 MG) BY MOUTH DAILY 90 capsule 3   famotidine (PEPCID) 20 MG tablet Take 1 tablet (20 mg total) by mouth 2 (two) times daily. 180 tablet 3   glucosamine-chondroitin 500-400 MG tablet Take 2 tablets by mouth daily.      leflunomide (ARAVA) 20 MG tablet TAKE 1 TABLET(20 MG) BY MOUTH DAILY 90 tablet 0   levothyroxine (SYNTHROID) 100 MCG tablet TAKE 1 TABLET BY MOUTH EVERY DAY 30 MINUTES BEFORE FOOD 90 tablet 3   MELATONIN PO Take 5 mg by mouth at bedtime.     metFORMIN (GLUCOPHAGE-XR) 500 MG 24 hr tablet TAKE 1 TABLET(500 MG) BY MOUTH DAILY WITH BREAKFAST 90 tablet 3  MIEBO 1.338 GM/ML SOLN Apply 1 drop to eye in the morning and at bedtime.     Multiple Vitamin (MULTIVITAMIN WITH MINERALS) TABS tablet Take 1 tablet by mouth daily.     olmesartan-hydrochlorothiazide (BENICAR HCT) 20-12.5 MG tablet Take 1 tablet by mouth every morning. 90 tablet 3   Omega-3 Fatty Acids (FISH OIL) 1000 MG CAPS Take by mouth.     pilocarpine (SALAGEN) 5 MG tablet Take 1 tablet (5 mg total) by mouth 3 (three) times daily as needed. (Patient not taking: Reported on 07/12/2022) 90 tablet 0   promethazine (PHENERGAN) 25 MG tablet Take 25 mg by mouth every 6 (six) hours as needed for nausea or vomiting.     RESTASIS 0.05 % ophthalmic emulsion Place 1 drop into both eyes 2 (two) times daily.  (Patient not taking: Reported on 07/12/2022)     simvastatin (ZOCOR) 40 MG tablet Take 1 tablet (40 mg total) by mouth at bedtime. After 6 pm d/c pravachol 40 90 tablet 3   SUMAtriptan (IMITREX) 50 MG tablet Take 1 tablet (50 mg total) by mouth every 2 (two) hours as needed for migraine. Max dose 200 mg/24 hours use rec as needed only 10 tablet 11   tamoxifen (NOLVADEX) 20 MG tablet TAKE 1 TABLET(20 MG) BY MOUTH DAILY 90 tablet 3   tiZANidine (ZANAFLEX) 4 MG tablet Take 1 tablet (4 mg total) by mouth every 6 (six) hours as needed for muscle spasms. 30 tablet 1   topiramate (TOPAMAX) 100 MG tablet TAKE 1 TABLET(100  MG) BY MOUTH DAILY 90 tablet 3   No current facility-administered medications for this visit.    PHYSICAL EXAMINATION: Not performed   LABORATORY DATA:  I have reviewed the data as listed    Latest Ref Rng & Units 06/25/2022   11:25 AM 03/09/2022   10:59 AM 11/19/2021   11:16 AM  CBC  WBC 3.4 - 10.8 x10E3/uL 4.3  3.5  6.0   Hemoglobin 11.1 - 15.9 g/dL 40.9  81.1  91.4   Hematocrit 34.0 - 46.6 % 42.2  40.7  40.6   Platelets 150 - 450 x10E3/uL 285  242  280         Latest Ref Rng & Units 06/25/2022   11:25 AM 03/09/2022   10:59 AM 11/19/2021   11:16 AM  CMP  Glucose 70 - 99 mg/dL 782  956  213   BUN 8 - 27 mg/dL 13  14  16    Creatinine 0.57 - 1.00 mg/dL 0.86  5.78  4.69   Sodium 134 - 144 mmol/L 143  144  143   Potassium 3.5 - 5.2 mmol/L 4.0  3.7  4.5   Chloride 96 - 106 mmol/L 107  109  107   CO2 20 - 29 mmol/L 20  26  26    Calcium 8.7 - 10.3 mg/dL 9.6  9.2  9.7   Total Protein 6.0 - 8.5 g/dL 6.8  6.8  7.2   Total Bilirubin 0.0 - 1.2 mg/dL <6.2  0.4  0.3   Alkaline Phos 44 - 121 IU/L 48  36    AST 0 - 40 IU/L 24  25  17    ALT 0 - 32 IU/L 18  19  12        RADIOGRAPHIC STUDIES: I have personally reviewed the radiological images as listed and agreed with the findings in the report. No results found.     I discussed the assessment and treatment plan with the patient.  The patient was provided an opportunity to ask questions and all were answered. The patient agreed with the plan and demonstrated an understanding of the instructions.   The patient was advised to call back or seek an in-person evaluation if the symptoms worsen or if the condition fails to improve as anticipated.  I provided 12 minutes of non face-to-face telephone visit time during this encounter, and > 50% was spent counseling as documented under my assessment & plan.     Malachy Mood, MD 08/26/22

## 2022-08-27 ENCOUNTER — Encounter: Payer: Self-pay | Admitting: *Deleted

## 2022-09-06 NOTE — Pre-Procedure Instructions (Incomplete)
Surgical Instructions    Your procedure is scheduled on September 14, 2022.  Report to [facility] Main Entrance "A" at 12:00 P.M., then check in with the Admitting office.  Call this number if you have problems the morning of surgery:  [phone]   If you have any questions prior to your surgery date call [phone]: Open Monday-Friday 8am-4pm If you experience any cold or flu symptoms such as cough, fever, chills, shortness of breath, etc. between now and your scheduled surgery, please notify us at the above number     Remember:  Do not eat after midnight the night before your surgery  You may drink clear liquids until 11:00AM the morning of your surgery.   Clear liquids allowed are: Water, Non-Citrus Juices (without pulp), Carbonated Beverages, Clear Tea, Black Coffee ONLY (NO MILK, CREAM OR POWDERED CREAMER of any kind), and Gatorade    Take these medicines the morning of surgery with A SIP OF WATER:  amLODipine (NORVASC)  esomeprazole (NEXIUM)  levothyroxine (SYNTHROID)  loratadine (CLARITIN)  MIEBO  eye drops topiramate (TOPAMAX)   IF needed: promethazine (PHENERGAN)  rizatriptan (MAXALT)  tiZANidine (ZANAFLEX)   Follow your surgeon's instructions on when to stop leflunomide (ARAVA) .  If no instructions were given by your surgeon then you will need to call the office to get those instructions.    As of today, STOP taking any Aspirin (unless otherwise instructed by your surgeon) Aleve, Naproxen, Ibuprofen, Motrin, Advil, Goody's, BC's, all herbal medications, fish oil, and all vitamins, aspirin-acetaminophen-caffeine (EXCEDRIN MIGRAINE)             WHAT DO I DO ABOUT MY DIABETES MEDICATION?   Do not take oral diabetes medicines (pills) the morning of surgery. DO NOT TAKE metFORMIN (GLUCOPHAGE-XR)  the day of surgery.     HOW TO MANAGE YOUR DIABETES BEFORE AND AFTER SURGERY  Why is it important to control my blood sugar before and after surgery? Improving blood  sugar levels before and after surgery helps healing and can limit problems. A way of improving blood sugar control is eating a healthy diet by:  Eating less sugar and carbohydrates  Increasing activity/exercise  Talking with your doctor about reaching your blood sugar goals High blood sugars (greater than 180 mg/dL) can raise your risk of infections and slow your recovery, so you will need to focus on controlling your diabetes during the weeks before surgery. Make sure that the doctor who takes care of your diabetes knows about your planned surgery including the date and location.  How do I manage my blood sugar before surgery? Check your blood sugar at least 4 times a day, starting 2 days before surgery, to make sure that the level is not too high or low.  Check your blood sugar the morning of your surgery when you wake up and every 2 hours until you get to the Short Stay unit.  If your blood sugar is less than 70 mg/dL, you will need to treat for low blood sugar: Do not take insulin. Treat a low blood sugar (less than 70 mg/dL) with  cup of clear juice (cranberry or apple), 4 glucose tablets, OR glucose gel. Recheck blood sugar in 15 minutes after treatment (to make sure it is greater than 70 mg/dL). If your blood sugar is not greater than 70 mg/dL on recheck, call [phone] for further instructions. Report your blood sugar to the short stay nurse when you get to Short Stay.  If you are admitted  to the hospital after surgery: Your blood sugar will be checked by the staff and you will probably be given insulin after surgery (instead of oral diabetes medicines) to make sure you have good blood sugar levels. The goal for blood sugar control after surgery is 80-180 mg/dL.  [facility] is not responsible for any belongings or valuables.    Do NOT Smoke (Tobacco/Vaping)  24 hours prior to your procedure  If you use a CPAP at night, you may bring your mask for your overnight stay.    Contacts, glasses, hearing aids, dentures or partials may not be worn into surgery, please bring cases for these belongings   For patients admitted to the hospital, discharge time will be determined by your treatment team.   Patients discharged the day of surgery will not be allowed to drive home, and someone needs to stay with them for 24 hours.   SURGICAL WAITING ROOM VISITATION Patients having surgery or a procedure may have no more than 2 support people in the waiting area - these visitors may rotate.   Children under the age of 52 must have an adult with them who is not the patient. If the patient needs to stay at the hospital during part of their recovery, the visitor guidelines for inpatient rooms apply. Pre-op nurse will coordinate an appropriate time for 1 support person to accompany patient in pre-op.  This support person may not rotate.   Please refer to [email or web address] for the visitor guidelines for Inpatients (after your surgery is over and you are in a regular room).    Special instructions:    Oral Hygiene is also important to reduce your risk of infection.  Remember - BRUSH YOUR TEETH THE MORNING OF SURGERY WITH YOUR REGULAR TOOTHPASTE   [facility]- Preparing For Surgery  Before surgery, you can play an important role. Because skin is not sterile, your skin needs to be as free of germs as possible. You can reduce the number of germs on your skin by washing with CHG (chlorahexidine gluconate) Soap before surgery.  CHG is an antiseptic cleaner which kills germs and bonds with the skin to continue killing germs even after washing.     Please do not use if you have an allergy to CHG or antibacterial soaps. If your skin becomes reddened/irritated stop using the CHG.  Do not shave (including legs and underarms) for at least 48 hours prior to first CHG shower. It is OK to shave your face.  Please follow these instructions  carefully.     Shower the NIGHT BEFORE SURGERY and the MORNING OF SURGERY with CHG Soap.   If you chose to wash your hair, wash your hair first as usual with your normal shampoo. After you shampoo, rinse your hair and body thoroughly to remove the shampoo.  Then [employer] and genitals (private parts) with your normal soap and rinse thoroughly to remove soap.  After that Use CHG Soap as you would any other liquid soap. You can apply CHG directly to the skin and wash gently with a scrungie or a clean washcloth.   Apply the CHG Soap to your body ONLY FROM THE NECK DOWN.  Do not use on open wounds or open sores. Avoid contact with your eyes, ears, mouth and genitals (private parts). Wash Face and genitals (private parts)  with your normal soap.   Wash thoroughly, paying special attention to the area where your surgery will be performed.  Thoroughly rinse your body with  warm water from the neck down.  DO NOT shower/wash with your normal soap after using and rinsing off the CHG Soap.  Pat yourself dry with a CLEAN TOWEL.  Wear CLEAN PAJAMAS to bed the night before surgery  Place CLEAN SHEETS on your bed the night before your surgery  DO NOT SLEEP WITH PETS.   Day of Surgery:  Take a shower with CHG soap. Wear Clean/Comfortable clothing the morning of surgery Do not wear jewelry or makeup. Do not wear lotions, powders, perfumes/cologne or deodorant. Do not shave 48 hours prior to surgery.  Men may shave face and neck. Do not bring valuables to the hospital. Do not wear nail polish, gel polish, artificial nails, or any other type of covering on natural nails (fingers and toes) If you have artificial nails or gel coating that need to be removed by a nail salon, please have this removed prior to surgery. Artificial nails or gel coating may interfere with anesthesia's ability to adequately monitor your vital signs. Remember to brush your teeth WITH YOUR REGULAR TOOTHPASTE.    If you  received a COVID test during your pre-op visit, it is requested that you wear a mask when out in public, stay away from anyone that may not be feeling well, and notify your surgeon if you develop symptoms. If you have been in contact with anyone that has tested positive in the last 10 days, please notify your surgeon.    Please read over the following fact sheets that you were given.

## 2022-09-07 ENCOUNTER — Encounter (HOSPITAL_COMMUNITY): Payer: Self-pay

## 2022-09-07 ENCOUNTER — Encounter (HOSPITAL_COMMUNITY)
Admission: RE | Admit: 2022-09-07 | Discharge: 2022-09-07 | Disposition: A | Discharge status: Home / Self Care | Payer: Medicare HMO | Source: Ambulatory Visit | Attending: Surgery | Admitting: Surgery

## 2022-09-07 ENCOUNTER — Other Ambulatory Visit: Payer: Self-pay

## 2022-09-07 VITALS — BP 141/73 | HR 90 | Temp 98.0°F | Resp 18 | Ht 68.0 in | Wt 199.4 lb

## 2022-09-07 DIAGNOSIS — J479 Bronchiectasis, uncomplicated: Secondary | ICD-10-CM | POA: Diagnosis not present

## 2022-09-07 DIAGNOSIS — I700 Atherosclerosis of aorta: Secondary | ICD-10-CM | POA: Insufficient documentation

## 2022-09-07 DIAGNOSIS — J84112 Idiopathic pulmonary fibrosis: Secondary | ICD-10-CM | POA: Diagnosis not present

## 2022-09-07 DIAGNOSIS — J398 Other specified diseases of upper respiratory tract: Secondary | ICD-10-CM | POA: Diagnosis not present

## 2022-09-07 DIAGNOSIS — Z01818 Encounter for other preprocedural examination: Secondary | ICD-10-CM | POA: Diagnosis not present

## 2022-09-07 DIAGNOSIS — I10 Essential (primary) hypertension: Secondary | ICD-10-CM

## 2022-09-07 DIAGNOSIS — N200 Calculus of kidney: Secondary | ICD-10-CM | POA: Diagnosis not present

## 2022-09-07 DIAGNOSIS — E119 Type 2 diabetes mellitus without complications: Secondary | ICD-10-CM | POA: Insufficient documentation

## 2022-09-07 HISTORY — DX: Family history of other specified conditions: Z84.89

## 2022-09-07 HISTORY — DX: Type 2 diabetes mellitus without complications: E11.9

## 2022-09-07 HISTORY — DX: Personal history of pneumonia (recurrent): Z87.01

## 2022-09-07 HISTORY — DX: Squamous cell carcinoma of skin of other part of trunk: C44.529

## 2022-09-07 LAB — CBC
HCT: 41.4 % (ref 36.0–46.0)
Hemoglobin: 13.2 g/dL (ref 12.0–15.0)
MCH: 29.7 pg (ref 26.0–34.0)
MCHC: 31.9 g/dL (ref 30.0–36.0)
MCV: 93 fL (ref 80.0–100.0)
Platelets: 250 10*3/uL (ref 150–400)
RBC: 4.45 MIL/uL (ref 3.87–5.11)
RDW: 13.4 % (ref 11.5–15.5)
WBC: 4.7 10*3/uL (ref 4.0–10.5)
nRBC: 0 % (ref 0.0–0.2)

## 2022-09-07 LAB — BASIC METABOLIC PANEL
Anion gap: 9 (ref 5–15)
BUN: 13 mg/dL (ref 8–23)
CO2: 25 mmol/L (ref 22–32)
Calcium: 9.3 mg/dL (ref 8.9–10.3)
Chloride: 107 mmol/L (ref 98–111)
Creatinine, Ser: 0.6 mg/dL (ref 0.44–1.00)
GFR, Estimated: >60 mL/min (ref >60)
Glucose, Bld: 116 mg/dL — ABNORMAL HIGH (ref 70–99)
Potassium: 3.9 mmol/L (ref 3.5–5.1)
Sodium: 141 mmol/L (ref 135–145)

## 2022-09-07 LAB — HEMOGLOBIN A1C
Hgb A1c MFr Bld: 6.4 % — ABNORMAL HIGH (ref 4.8–5.6)
Mean Plasma Glucose: 136.98 mg/dL

## 2022-09-07 LAB — GLUCOSE, CAPILLARY: Glucose-Capillary: 139 mg/dL — ABNORMAL HIGH (ref 70–99)

## 2022-09-07 NOTE — Progress Notes (Signed)
PCP - Dr Dana Allan Cardiologist - denies  PPM/ICD - denies   Chest x-ray -  N/A EKG - 09/07/2022  Stress Test - denies ECHO - denies Cardiac Cath - denies  Sleep Study - denies   Fasting Blood Sugar - Pt reports that she is Diabetic, and her PCP's A1c goal is lower than normal due to being on tamoxifen. Pt states she does not check her blood sugar at home. CBG 139 today. A1c drawn.   Last dose of GLP1 agonist-  denies   Blood Thinner Instructions: N/A Aspirin Instructions:N/A  Pt reports Tamoxifen is on hold and her last dose of Arava was 09/07/22.   ERAS Protcol - ERAS per order   COVID TEST- N/A   Anesthesia review: Pt with history of difficult intubation. Pt states this is due to her having a trach in her 5s during complicated tonsillectomy surgery. Pt reports she has been intubated since then- last for her gall bladder in 2021. EKG done at PAT- for anesthesia to review.   Patient denies shortness of breath, fever, cough and chest pain at PAT appointment   All instructions explained to the patient, with a verbal understanding of the material. Patient agrees to go over the instructions while at home for a better understanding. Patient also instructed to self quarantine after being tested for COVID-19. The opportunity to ask questions was provided.

## 2022-09-08 NOTE — Progress Notes (Signed)
Anesthesia Chart Review:  Pt reports history of difficult intubation. Pt states this is due to her having a trach in her 26s during complicated tonsillectomy surgery. Pt reports she has been intubated since then without issue, last for her laparoscopic cholecystectomy 11/02/2019.  Anesthesia airway notes from that encounter state, "Difficulty Due To: Difficulty was anticipated. Comments: 6.0 ETT elective, pt w/ hx trach."  History of GERD, maintained on famotidine.   Follows with rheumatology for history of rheumatoid arthritis and ILD, maintained on Areva.    Non-insulin-dependent DM2, A1c 6.4 on 09/07/2022.  Preop labs reviewed, WNL.  EKG 09/07/2022: Normal sinus rhythm.  Rate 81. Left axis deviation. Low voltage QRS. Cannot rule out Anterior infarct , age undetermined  PFTs 07/29/2022: FVC-%Pred-Pre % 101  FEV1-%Pred-Pre % 105  FEV1FVC-%Pred-Pre % 103  TLC % pred % 105  RV % pred % 96  DLCO unc % pred % 43   HRCT chest 08/04/2022: IMPRESSION: 1. Minimal subpleural reticular densities and traction bronchiolectasis in the lateral aspects of the mid and lower lung zones, grossly stable from 09/26/2019. Findings may be due to nonspecific interstitial pneumonitis. Findings are indeterminate for UIP per consensus guidelines: Diagnosis of Idiopathic Pulmonary Fibrosis: An Official ATS/ERS/JRS/ALAT Clinical Practice Guideline. Am Rosezetta Schlatter Crit Care Med Vol 198, Iss 5, [phone], Sep 18 2016. 2. Air trapping is indicative of small airways disease. 3. Mild cylindrical bronchiectasis. 4. Tracheobronchomalacia 5. Liver appears steatotic and may be enlarged but is incompletely imaged. 6. Small left renal stones. 7.  Aortic atherosclerosis (ICD10-I70.0).    Zannie Cove [facility] Short Stay Center/Anesthesiology Phone [phone] 09/08/2022 12:46 PM

## 2022-09-08 NOTE — Anesthesia Preprocedure Evaluation (Addendum)
Anesthesia Evaluation  Patient identified by MRN, date of birth, ID band Patient awake    Reviewed: Allergy & Precautions, NPO status , Patient's Chart, lab work & pertinent test results  History of Anesthesia Complications (+) PONV and history of anesthetic complications  Airway Mallampati: II  TM Distance: >3 FB Neck ROM: Full    Dental  (+) Dental Advisory Given   Pulmonary shortness of breath, former smoker   breath sounds clear to auscultation       Cardiovascular hypertension, Pt. on medications  Rhythm:Regular Rate:Normal     Neuro/Psych  Headaches    GI/Hepatic Neg liver ROS, hiatal hernia,GERD  ,,  Endo/Other  diabetes, Type 2Hypothyroidism    Renal/GU negative Renal ROS     Musculoskeletal  (+) Arthritis ,    Abdominal   Peds  Hematology negative hematology ROS (+)   Anesthesia Other Findings   Reproductive/Obstetrics                             Anesthesia Physical Anesthesia Plan  ASA: 2  Anesthesia Plan: General   Post-op Pain Management: Regional block* and Tylenol PO (pre-op)*   Induction:   PONV Risk Score and Plan: 4 or greater and Dexamethasone, Ondansetron, Midazolam and Treatment may vary due to age or medical condition  Airway Management Planned: LMA  Additional Equipment:   Intra-op Plan:   Post-operative Plan: Extubation in OR  Informed Consent: I have reviewed the patients History and Physical, chart, labs and discussed the procedure including the risks, benefits and alternatives for the proposed anesthesia with the patient or authorized representative who has indicated his/her understanding and acceptance.     Dental advisory given  Plan Discussed with: CRNA  Anesthesia Plan Comments: (PAT note by Antionette Poles, PA-C:  Pt reports history of difficult intubation. Pt states this is due to her having a trach in her 5s during complicated  tonsillectomy surgery. Pt reports she has been intubated since then without issue, last for her laparoscopic cholecystectomy 11/02/2019.  Anesthesia airway notes from that encounter state, "Difficulty Due To: Difficulty was anticipated. Comments: 6.0 ETT elective, pt w/ hx trach."  History of GERD, maintained on famotidine.   Follows with rheumatology for history of rheumatoid arthritis and ILD, maintained on Areva.    Non-insulin-dependent DM2, A1c 6.4 on 09/07/2022.  Preop labs reviewed, WNL.  EKG 09/07/2022: Normal sinus rhythm.  Rate 81. Left axis deviation. Low voltage QRS. Cannot rule out Anterior infarct , age undetermined  PFTs 07/29/2022: FVC-%Pred-Pre % 101 FEV1-%Pred-Pre % 105 FEV1FVC-%Pred-Pre % 103 TLC % pred % 105 RV % pred % 96 DLCO unc % pred % 43  HRCT chest 08/04/2022: IMPRESSION: 1. Minimal subpleural reticular densities and traction bronchiolectasis in the lateral aspects of the mid and lower lung zones, grossly stable from 09/26/2019. Findings may be due to nonspecific interstitial pneumonitis. Findings are indeterminate for UIP per consensus guidelines: Diagnosis of Idiopathic Pulmonary Fibrosis: An Official ATS/ERS/JRS/ALAT Clinical Practice Guideline. Am Rosezetta Schlatter Crit Care Med Vol 198, Iss 5, [phone], Sep 18 2016. 2. Air trapping is indicative of small airways disease. 3. Mild cylindrical bronchiectasis. 4. Tracheobronchomalacia 5. Liver appears steatotic and may be enlarged but is incompletely imaged. 6. Small left renal stones. 7.  Aortic atherosclerosis (ICD10-I70.0).   )        Anesthesia Quick Evaluation

## 2022-09-10 ENCOUNTER — Encounter: Payer: Self-pay | Admitting: *Deleted

## 2022-09-14 ENCOUNTER — Encounter (HOSPITAL_COMMUNITY)
Admission: RE | Disposition: A | Discharge status: Home / Self Care | Payer: Self-pay | Source: Home / Self Care | Attending: Surgery

## 2022-09-14 ENCOUNTER — Other Ambulatory Visit: Payer: Self-pay

## 2022-09-14 ENCOUNTER — Encounter (HOSPITAL_COMMUNITY): Payer: Self-pay | Admitting: Surgery

## 2022-09-14 ENCOUNTER — Ambulatory Visit (HOSPITAL_BASED_OUTPATIENT_CLINIC_OR_DEPARTMENT_OTHER): Payer: Medicare HMO

## 2022-09-14 ENCOUNTER — Ambulatory Visit (HOSPITAL_COMMUNITY): Payer: Medicare HMO | Admitting: Physician Assistant

## 2022-09-14 ENCOUNTER — Observation Stay (HOSPITAL_COMMUNITY)
Admission: RE | Admit: 2022-09-14 | Discharge: 2022-09-15 | Disposition: A | Discharge status: Home / Self Care | Payer: Medicare HMO | Attending: Surgery | Admitting: Surgery

## 2022-09-14 ENCOUNTER — Encounter (HOSPITAL_COMMUNITY)
Admission: RE | Admit: 2022-09-14 | Discharge: 2022-09-14 | Disposition: A | Discharge status: Home / Self Care | Payer: Medicare HMO | Source: Ambulatory Visit | Attending: Surgery | Admitting: Surgery

## 2022-09-14 DIAGNOSIS — D0511 Intraductal carcinoma in situ of right breast: Secondary | ICD-10-CM | POA: Diagnosis not present

## 2022-09-14 DIAGNOSIS — Z170 Estrogen receptor positive status [ER+]: Secondary | ICD-10-CM | POA: Insufficient documentation

## 2022-09-14 DIAGNOSIS — Z853 Personal history of malignant neoplasm of breast: Secondary | ICD-10-CM | POA: Insufficient documentation

## 2022-09-14 DIAGNOSIS — I10 Essential (primary) hypertension: Secondary | ICD-10-CM | POA: Diagnosis not present

## 2022-09-14 DIAGNOSIS — Z85828 Personal history of other malignant neoplasm of skin: Secondary | ICD-10-CM | POA: Diagnosis not present

## 2022-09-14 DIAGNOSIS — E119 Type 2 diabetes mellitus without complications: Secondary | ICD-10-CM | POA: Insufficient documentation

## 2022-09-14 DIAGNOSIS — Z87891 Personal history of nicotine dependence: Secondary | ICD-10-CM | POA: Insufficient documentation

## 2022-09-14 DIAGNOSIS — C50811 Malignant neoplasm of overlapping sites of right female breast: Secondary | ICD-10-CM | POA: Diagnosis not present

## 2022-09-14 DIAGNOSIS — Z79899 Other long term (current) drug therapy: Secondary | ICD-10-CM | POA: Insufficient documentation

## 2022-09-14 HISTORY — PX: MASTECTOMY W/ SENTINEL NODE BIOPSY: SHX2001

## 2022-09-14 LAB — GLUCOSE, CAPILLARY
Glucose-Capillary: 97 mg/dL (ref 70–99)
Glucose-Capillary: 241 mg/dL — ABNORMAL HIGH (ref 70–99)

## 2022-09-14 SURGERY — MASTECTOMY WITH SENTINEL LYMPH NODE BIOPSY
Anesthesia: General | Laterality: Right

## 2022-09-14 MED ORDER — ONDANSETRON HCL 4 MG/2ML IJ SOLN: 4.0000 mg | Freq: Four times a day (QID) | INTRAMUSCULAR | Status: DC | PRN

## 2022-09-14 MED ORDER — CARBOXYMETHYLCELLULOSE SOD PF 1 % OP GEL: 1.0000 | Freq: Every day | OPHTHALMIC | Status: DC

## 2022-09-14 MED ORDER — CLONIDINE HCL (ANALGESIA) 100 MCG/ML EP SOLN
EPIDURAL | Status: DC | PRN
  Administered 2022-09-14: 50 ug

## 2022-09-14 MED ORDER — MIDAZOLAM HCL 2 MG/2ML IJ SOLN
INTRAMUSCULAR | Status: AC
  Administered 2022-09-14: 1 mg via INTRAVENOUS
  Filled 2022-09-14: qty 2

## 2022-09-14 MED ORDER — LIDOCAINE 2% (20 MG/ML) 5 ML SYRINGE
INTRAMUSCULAR | Status: AC
  Filled 2022-09-14: qty 5

## 2022-09-14 MED ORDER — SIMVASTATIN 20 MG PO TABS: 40.0000 mg | ORAL_TABLET | Freq: Every day | ORAL | Status: DC

## 2022-09-14 MED ORDER — SODIUM CHLORIDE 0.9 % IV SOLN: INTRAVENOUS | Status: DC

## 2022-09-14 MED ORDER — METHOCARBAMOL 500 MG PO TABS: 500.0000 mg | ORAL_TABLET | Freq: Four times a day (QID) | ORAL | Status: DC | PRN

## 2022-09-14 MED ORDER — FENTANYL CITRATE (PF) 100 MCG/2ML IJ SOLN
INTRAMUSCULAR | Status: AC
  Filled 2022-09-14: qty 2

## 2022-09-14 MED ORDER — FENTANYL CITRATE (PF) 100 MCG/2ML IJ SOLN
INTRAMUSCULAR | Status: AC
  Administered 2022-09-14: 50 ug via INTRAVENOUS
  Filled 2022-09-14: qty 2

## 2022-09-14 MED ORDER — LEFLUNOMIDE 10 MG PO TABS
20.0000 mg | ORAL_TABLET | Freq: Every day | ORAL | Status: DC
  Filled 2022-09-14: qty 2

## 2022-09-14 MED ORDER — ONDANSETRON 4 MG PO TBDP: 4.0000 mg | ORAL_TABLET | Freq: Four times a day (QID) | ORAL | Status: DC | PRN

## 2022-09-14 MED ORDER — AMISULPRIDE (ANTIEMETIC) 5 MG/2ML IV SOLN: 10.0000 mg | Freq: Once | INTRAVENOUS | Status: DC | PRN

## 2022-09-14 MED ORDER — PANTOPRAZOLE SODIUM 40 MG PO TBEC
40.0000 mg | DELAYED_RELEASE_TABLET | Freq: Every day | ORAL | Status: DC
  Filled 2022-09-14: qty 1

## 2022-09-14 MED ORDER — ORAL CARE MOUTH RINSE: 15.0000 mL | Freq: Once | OROMUCOSAL | Status: AC

## 2022-09-14 MED ORDER — KETOROLAC TROMETHAMINE 30 MG/ML IJ SOLN
INTRAMUSCULAR | Status: DC | PRN
  Administered 2022-09-14: 30 mg via INTRAVENOUS

## 2022-09-14 MED ORDER — VITAMIN B-12 1000 MCG PO TABS
1000.0000 ug | ORAL_TABLET | ORAL | Status: DC
  Administered 2022-09-15: 1000 ug via ORAL
  Filled 2022-09-14: qty 1

## 2022-09-14 MED ORDER — ACETAMINOPHEN 500 MG PO TABS
1000.0000 mg | ORAL_TABLET | Freq: Four times a day (QID) | ORAL | Status: DC
  Administered 2022-09-14 – 2022-09-15 (×3): 1000 mg via ORAL
  Filled 2022-09-14 (×3): qty 2

## 2022-09-14 MED ORDER — PHENYLEPHRINE 80 MCG/ML (10ML) SYRINGE FOR IV PUSH (FOR BLOOD PRESSURE SUPPORT)
PREFILLED_SYRINGE | INTRAVENOUS | Status: DC | PRN
Start: 2022-09-14 — End: 2022-09-14
  Administered 2022-09-14 (×3): 80 ug via INTRAVENOUS
  Administered 2022-09-14: 160 ug via INTRAVENOUS
  Administered 2022-09-14: 80 ug via INTRAVENOUS
  Administered 2022-09-14: 160 ug via INTRAVENOUS
  Administered 2022-09-14: 80 ug via INTRAVENOUS
  Administered 2022-09-14: 160 ug via INTRAVENOUS
  Administered 2022-09-14 (×2): 80 ug via INTRAVENOUS

## 2022-09-14 MED ORDER — MIDAZOLAM HCL 2 MG/2ML IJ SOLN: 1.0000 mg | Freq: Once | INTRAMUSCULAR | Status: AC

## 2022-09-14 MED ORDER — FENTANYL CITRATE (PF) 100 MCG/2ML IJ SOLN
INTRAMUSCULAR | Status: DC | PRN
  Administered 2022-09-14 (×2): 25 ug via INTRAVENOUS
  Administered 2022-09-14 (×2): 50 ug via INTRAVENOUS

## 2022-09-14 MED ORDER — LIDOCAINE 2% (20 MG/ML) 5 ML SYRINGE
INTRAMUSCULAR | Status: DC | PRN
  Administered 2022-09-14: 20 mg via INTRAVENOUS

## 2022-09-14 MED ORDER — FENTANYL CITRATE (PF) 100 MCG/2ML IJ SOLN: 50.0000 ug | Freq: Once | INTRAMUSCULAR | Status: AC

## 2022-09-14 MED ORDER — HYDROCHLOROTHIAZIDE 12.5 MG PO TABS
12.5000 mg | ORAL_TABLET | Freq: Every day | ORAL | Status: DC
  Administered 2022-09-15: 12.5 mg via ORAL
  Filled 2022-09-14: qty 1

## 2022-09-14 MED ORDER — OLMESARTAN MEDOXOMIL-HCTZ 20-12.5 MG PO TABS: 1.0000 | ORAL_TABLET | Freq: Every morning | ORAL | Status: DC

## 2022-09-14 MED ORDER — DEXAMETHASONE SODIUM PHOSPHATE 10 MG/ML IJ SOLN
INTRAMUSCULAR | Status: AC
  Filled 2022-09-14: qty 1

## 2022-09-14 MED ORDER — LEVOTHYROXINE SODIUM 100 MCG PO TABS
100.0000 ug | ORAL_TABLET | Freq: Every day | ORAL | Status: DC
  Administered 2022-09-15: 100 ug via ORAL
  Filled 2022-09-14: qty 1

## 2022-09-14 MED ORDER — CEFAZOLIN SODIUM-DEXTROSE 2-4 GM/100ML-% IV SOLN
2.0000 g | INTRAVENOUS | Status: AC
  Administered 2022-09-14: 2 g via INTRAVENOUS
  Filled 2022-09-14: qty 100

## 2022-09-14 MED ORDER — TRANEXAMIC ACID 1000 MG/10ML IV SOLN
2000.0000 mg | Freq: Once | INTRAVENOUS | Status: AC
  Administered 2022-09-14: 2000 mg via TOPICAL
  Filled 2022-09-14 (×2): qty 20

## 2022-09-14 MED ORDER — ONDANSETRON HCL 4 MG/2ML IJ SOLN
INTRAMUSCULAR | Status: DC | PRN
Start: 2022-09-14 — End: 2022-09-14
  Administered 2022-09-14: 4 mg via INTRAVENOUS

## 2022-09-14 MED ORDER — CHLORHEXIDINE GLUCONATE 0.12 % MT SOLN
15.0000 mL | Freq: Once | OROMUCOSAL | Status: AC
  Administered 2022-09-14: 15 mL via OROMUCOSAL
  Filled 2022-09-14: qty 15

## 2022-09-14 MED ORDER — OXYCODONE HCL 5 MG PO TABS: 5.0000 mg | ORAL_TABLET | ORAL | Status: DC | PRN

## 2022-09-14 MED ORDER — AMLODIPINE BESYLATE 2.5 MG PO TABS
2.5000 mg | ORAL_TABLET | Freq: Every day | ORAL | Status: DC
  Filled 2022-09-14: qty 1

## 2022-09-14 MED ORDER — DIPHENHYDRAMINE HCL 50 MG/ML IJ SOLN: 12.5000 mg | Freq: Four times a day (QID) | INTRAMUSCULAR | Status: DC | PRN

## 2022-09-14 MED ORDER — FENTANYL CITRATE (PF) 250 MCG/5ML IJ SOLN
INTRAMUSCULAR | Status: AC
  Filled 2022-09-14: qty 5

## 2022-09-14 MED ORDER — ACETAMINOPHEN 500 MG PO TABS
1000.0000 mg | ORAL_TABLET | ORAL | Status: AC
  Administered 2022-09-14: 1000 mg via ORAL
  Filled 2022-09-14: qty 2

## 2022-09-14 MED ORDER — POLYVINYL ALCOHOL 1.4 % OP SOLN
1.0000 [drp] | Freq: Every day | OPHTHALMIC | Status: DC
  Administered 2022-09-14: 1 [drp] via OPHTHALMIC
  Filled 2022-09-14: qty 15

## 2022-09-14 MED ORDER — PERFLUOROHEXYLOCTANE 1.338 GM/ML OP SOLN: 1.0000 [drp] | Freq: Every day | OPHTHALMIC | Status: DC

## 2022-09-14 MED ORDER — MAGTRACE LYMPHATIC TRACER
INTRAMUSCULAR | Status: DC | PRN
  Administered 2022-09-14: 2 mL via INTRAMUSCULAR

## 2022-09-14 MED ORDER — INSULIN ASPART 100 UNIT/ML IJ SOLN
0.0000 [IU] | Freq: Every day | INTRAMUSCULAR | Status: DC
  Administered 2022-09-14: 2 [IU] via SUBCUTANEOUS

## 2022-09-14 MED ORDER — LORATADINE 10 MG PO TABS
10.0000 mg | ORAL_TABLET | Freq: Every day | ORAL | Status: DC
  Administered 2022-09-15: 10 mg via ORAL
  Filled 2022-09-14: qty 1

## 2022-09-14 MED ORDER — DIPHENHYDRAMINE HCL 12.5 MG/5ML PO ELIX: 12.5000 mg | ORAL_SOLUTION | Freq: Four times a day (QID) | ORAL | Status: DC | PRN

## 2022-09-14 MED ORDER — CHLORHEXIDINE GLUCONATE CLOTH 2 % EX PADS: 6.0000 | MEDICATED_PAD | Freq: Once | CUTANEOUS | Status: DC

## 2022-09-14 MED ORDER — MELATONIN 5 MG PO TABS
10.0000 mg | ORAL_TABLET | Freq: Every day | ORAL | Status: DC
  Administered 2022-09-14: 10 mg via ORAL
  Filled 2022-09-14: qty 2

## 2022-09-14 MED ORDER — INSULIN ASPART 100 UNIT/ML IJ SOLN: 0.0000 [IU] | Freq: Three times a day (TID) | INTRAMUSCULAR | Status: DC

## 2022-09-14 MED ORDER — TECHNETIUM TC 99M TILMANOCEPT KIT
1.0000 | PACK | Freq: Once | INTRAVENOUS | Status: AC | PRN
  Administered 2022-09-14: 1 via INTRADERMAL

## 2022-09-14 MED ORDER — TAMOXIFEN CITRATE 10 MG PO TABS
20.0000 mg | ORAL_TABLET | Freq: Every day | ORAL | Status: DC
  Filled 2022-09-14: qty 2

## 2022-09-14 MED ORDER — DOCUSATE SODIUM 100 MG PO CAPS
100.0000 mg | ORAL_CAPSULE | Freq: Two times a day (BID) | ORAL | Status: DC
  Administered 2022-09-14 – 2022-09-15 (×2): 100 mg via ORAL
  Filled 2022-09-14 (×2): qty 1

## 2022-09-14 MED ORDER — DEXAMETHASONE SODIUM PHOSPHATE 10 MG/ML IJ SOLN
INTRAMUSCULAR | Status: DC | PRN
Start: 2022-09-14 — End: 2022-09-14
  Administered 2022-09-14: 10 mg via INTRAVENOUS

## 2022-09-14 MED ORDER — TRAMADOL HCL 50 MG PO TABS
50.0000 mg | ORAL_TABLET | Freq: Four times a day (QID) | ORAL | Status: DC | PRN
  Administered 2022-09-15: 50 mg via ORAL
  Filled 2022-09-14: qty 1

## 2022-09-14 MED ORDER — FENTANYL CITRATE (PF) 100 MCG/2ML IJ SOLN
25.0000 ug | INTRAMUSCULAR | Status: DC | PRN
  Administered 2022-09-14: 25 ug via INTRAVENOUS

## 2022-09-14 MED ORDER — METFORMIN HCL ER 500 MG PO TB24
500.0000 mg | ORAL_TABLET | Freq: Every day | ORAL | Status: DC
  Administered 2022-09-15: 500 mg via ORAL
  Filled 2022-09-14: qty 1

## 2022-09-14 MED ORDER — FAMOTIDINE 20 MG PO TABS
20.0000 mg | ORAL_TABLET | Freq: Two times a day (BID) | ORAL | Status: DC
  Administered 2022-09-14 – 2022-09-15 (×2): 20 mg via ORAL
  Filled 2022-09-14 (×2): qty 1

## 2022-09-14 MED ORDER — MORPHINE SULFATE (PF) 4 MG/ML IV SOLN: 4.0000 mg | INTRAVENOUS | Status: DC | PRN

## 2022-09-14 MED ORDER — IRBESARTAN 150 MG PO TABS
150.0000 mg | ORAL_TABLET | Freq: Every day | ORAL | Status: DC
  Administered 2022-09-15: 150 mg via ORAL
  Filled 2022-09-14: qty 1

## 2022-09-14 MED ORDER — KETOROLAC TROMETHAMINE 30 MG/ML IJ SOLN
INTRAMUSCULAR | Status: AC
  Filled 2022-09-14: qty 1

## 2022-09-14 MED ORDER — ONDANSETRON HCL 4 MG/2ML IJ SOLN
INTRAMUSCULAR | Status: AC
  Filled 2022-09-14: qty 2

## 2022-09-14 MED ORDER — LACTATED RINGERS IV SOLN: INTRAVENOUS | Status: DC

## 2022-09-14 MED ORDER — ATORVASTATIN CALCIUM 10 MG PO TABS
20.0000 mg | ORAL_TABLET | Freq: Every day | ORAL | Status: DC
  Administered 2022-09-14: 20 mg via ORAL
  Filled 2022-09-14: qty 2

## 2022-09-14 MED ORDER — 0.9 % SODIUM CHLORIDE (POUR BTL) OPTIME
TOPICAL | Status: DC | PRN
  Administered 2022-09-14: 1000 mL

## 2022-09-14 MED ORDER — PROPOFOL 10 MG/ML IV BOLUS
INTRAVENOUS | Status: DC | PRN
  Administered 2022-09-14: 150 mg via INTRAVENOUS
  Administered 2022-09-14: 50 mg via INTRAVENOUS

## 2022-09-14 MED ORDER — BUPIVACAINE-EPINEPHRINE (PF) 0.5% -1:200000 IJ SOLN
INTRAMUSCULAR | Status: DC | PRN
  Administered 2022-09-14: 30 mL

## 2022-09-14 SURGICAL SUPPLY — 55 items
APL PRP STRL LF DISP 70% ISPRP (MISCELLANEOUS) ×1
APL SKNCLS STERI-STRIP NONHPOA (GAUZE/BANDAGES/DRESSINGS) ×1
APPLIER CLIP 9.375 MED OPEN (MISCELLANEOUS) ×1
APR CLP MED 9.3 20 MLT OPN (MISCELLANEOUS) ×1
BAG COUNTER SPONGE SURGICOUNT (BAG) ×1 IMPLANT
BAG SPNG CNTER NS LX DISP (BAG) ×1
BENZOIN TINCTURE PRP APPL 2/3 (GAUZE/BANDAGES/DRESSINGS) IMPLANT
BINDER BREAST LRG (GAUZE/BANDAGES/DRESSINGS) IMPLANT
BINDER BREAST XLRG (GAUZE/BANDAGES/DRESSINGS) IMPLANT
CANISTER SUCT 3000ML PPV (MISCELLANEOUS) ×1 IMPLANT
CHLORAPREP W/TINT 26 (MISCELLANEOUS) ×1 IMPLANT
CLIP APPLIE 9.375 MED OPEN (MISCELLANEOUS) ×1 IMPLANT
CNTNR URN SCR LID CUP LEK RST (MISCELLANEOUS) ×1 IMPLANT
CONT SPEC 4OZ STRL OR WHT (MISCELLANEOUS) ×1
COVER PROBE W GEL 5X96 (DRAPES) ×1 IMPLANT
COVER SURGICAL LIGHT HANDLE (MISCELLANEOUS) ×1 IMPLANT
DRAIN CHANNEL 19F RND (DRAIN) ×1 IMPLANT
DRAIN RELI 100 BL SUC LF ST (DRAIN) ×1
DRAPE CHEST BREAST 15X10 FENES (DRAPES) ×1 IMPLANT
DRSG TEGADERM 4X4.5 CHG (GAUZE/BANDAGES/DRESSINGS) IMPLANT
ELECT BLADE 4.0 EZ CLEAN MEGAD (MISCELLANEOUS) ×1
ELECT CAUTERY BLADE 6.4 (BLADE) ×1 IMPLANT
ELECT REM PT RETURN 9FT ADLT (ELECTROSURGICAL) ×1
ELECTRODE BLDE 4.0 EZ CLN MEGD (MISCELLANEOUS) ×1 IMPLANT
ELECTRODE REM PT RTRN 9FT ADLT (ELECTROSURGICAL) ×1 IMPLANT
EVACUATOR SILICONE 100CC (DRAIN) ×1 IMPLANT
GAUZE PAD ABD 8X10 STRL (GAUZE/BANDAGES/DRESSINGS) ×2 IMPLANT
GAUZE SPONGE 4X4 12PLY STRL (GAUZE/BANDAGES/DRESSINGS) ×1 IMPLANT
GLOVE BIO SURGEON STRL SZ7 (GLOVE) ×1 IMPLANT
GLOVE BIOGEL PI IND STRL 7.5 (GLOVE) ×1 IMPLANT
GOWN STRL REUS W/ TWL LRG LVL3 (GOWN DISPOSABLE) ×2 IMPLANT
GOWN STRL REUS W/TWL LRG LVL3 (GOWN DISPOSABLE) ×2
KIT BASIN OR (CUSTOM PROCEDURE TRAY) ×1 IMPLANT
KIT TURNOVER KIT B (KITS) ×1 IMPLANT
NDL 18GX1X1/2 (RX/OR ONLY) (NEEDLE) ×1 IMPLANT
NDL FILTER BLUNT 18X1 1/2 (NEEDLE) ×1 IMPLANT
NDL HYPO 25GX1X1/2 BEV (NEEDLE) ×1 IMPLANT
NEEDLE 18GX1X1/2 (RX/OR ONLY) (NEEDLE) ×1
NEEDLE FILTER BLUNT 18X1 1/2 (NEEDLE) ×1
NEEDLE HYPO 25GX1X1/2 BEV (NEEDLE) ×1
NS IRRIG 1000ML POUR BTL (IV SOLUTION) ×1 IMPLANT
PACK GENERAL/GYN (CUSTOM PROCEDURE TRAY) ×1 IMPLANT
PAD ARMBOARD 7.5X6 YLW CONV (MISCELLANEOUS) ×1 IMPLANT
PENCIL SMOKE EVACUATOR (MISCELLANEOUS) ×1 IMPLANT
SPECIMEN JAR X LARGE (MISCELLANEOUS) ×1 IMPLANT
STAPLER VISISTAT 35W (STAPLE) ×1 IMPLANT
STRIP CLOSURE SKIN 1/2X4 (GAUZE/BANDAGES/DRESSINGS) IMPLANT
SUT ETHILON 2 0 FS 18 (SUTURE) ×1 IMPLANT
SUT MNCRL AB 4-0 PS2 18 (SUTURE) ×1 IMPLANT
SUT SILK 2 0 PERMA HAND 18 BK (SUTURE) ×1 IMPLANT
SUT VIC AB 3-0 SH 18 (SUTURE) ×1 IMPLANT
SYR CONTROL 10ML LL (SYRINGE) ×1 IMPLANT
TOWEL GREEN STERILE (TOWEL DISPOSABLE) ×1 IMPLANT
TOWEL GREEN STERILE FF (TOWEL DISPOSABLE) ×1 IMPLANT
TRACER MAGTRACE VIAL (MISCELLANEOUS) IMPLANT

## 2022-09-14 NOTE — Anesthesia Procedure Notes (Signed)
Procedure Name: LMA Insertion Date/Time: 09/14/2022 1:32 PM  Performed by: Eulah Pont, CRNAPre-anesthesia Checklist: Patient identified, Emergency Drugs available, Suction available and Patient being monitored Patient Re-evaluated:Patient Re-evaluated prior to induction Oxygen Delivery Method: Circle System Utilized Preoxygenation: Pre-oxygenation with 100% oxygen Induction Type: IV induction Ventilation: Mask ventilation without difficulty LMA: LMA inserted LMA Size: 4.0 Number of attempts: 1 Airway Equipment and Method: Bite block Placement Confirmation: positive ETCO2 Tube secured with: Tape Dental Injury: Teeth and Oropharynx as per pre-operative assessment

## 2022-09-14 NOTE — Interval H&P Note (Signed)
History and Physical Interval Note:  09/14/2022 11:01 AM  Cheryl Knight  has presented today for surgery, with the diagnosis of RIGHT BREAST DUCTAL CARCINOMA IN SITU, HISTORY OF RIGHT BREAST CANCER.  The various methods of treatment have been discussed with the patient and family. After consideration of risks, benefits and other options for treatment, the patient has consented to  Procedure(s) with comments: RIGHT MASTECTOMY WITH SENTINEL LYMPH NODE BIOPSY (Right) - LMA PEC BLOCK as a surgical intervention.  The patient's history has been reviewed, patient examined, no change in status, stable for surgery.  I have reviewed the patient's chart and labs.  Questions were answered to the patient's satisfaction.     Wynona Luna

## 2022-09-14 NOTE — Op Note (Signed)
Pre-op diagnosis: Recurrent ductal carcinoma in situ right breast Postop diagnosis: Same Procedure performed: Right mastectomy with right axillary sentinel lymph node biopsy Surgeon:Andrez Lieurance K Bertha Lokken Anesthesia: General Via LMA with pectoralis block Indications: This is a 67 year old female who was diagnosed in 2021 with grade 1 invasive ductal carcinoma of the right breast.  She underwent lumpectomy and sentinel lymph node biopsy.  The margins were negative.  The sentinel lymph node biopsy was negative.  She underwent radiation therapy and is currently on tamoxifen.  She was recently diagnosed with a 1.4 cm area of ductal carcinoma in situ in the right breast.  There is some suspicion for microinvasion.  She presents now for mastectomy with sentinel lymph node biopsy.  Description of procedure: The patient is brought to the operating room and placed in the supine position on the operating table.  After an adequate level general anesthesia was obtained, her right chest and axilla were prepped with ChloraPrep and draped sterile fashion.  Timeout was taken to ensure the proper patient proper procedure.  She had previously been injected with radiotracer as well as mag trace.  I interrogated the axilla and there was minimal activity.  We made an elliptical incision to include the entire nipple areolar complex.  We raised her skin flaps with cautery.  We dissected superiorly to the the infraclavicular chest wall.  Medially we dissected through the edge of the sternum.  Inferiorly we dissected down to the inframammary crease.  Laterally we dissected to the anterior edge of the latissimus muscle.  I then dissected the breast tissue off of the underlying pectoralis muscle from medial to lateral.  We included the anterior pectoralis fascia with the specimen.  The specimen was removed and oriented with a long suture lateral and a short suture superior.  This was sent as right breast.  We then again interrogated the  axilla.  There was minimal activity with the radiotracer.  There is some palpable scar tissue from her previous sentinel lymph node biopsy.  However, we were able to detect some magnetic activity.  We excised a small cluster of lymph nodes that had a moderate amount of magnetic activity.  These were sent as possible sentinel lymph nodes from the right axilla.  There is no background activity.  We then irrigated the wound thoroughly and inspected for hemostasis.  We soaked the sponge with TXA solution and placed into the mastectomy site for several minutes.  A 19 French drain was brought in through a stab incision and secured with 2-0 Ethilon.  We removed her TXA sponge and again inspected for hemostasis.  The wound was closed with multiple interrupted 3-0 Vicryl sutures.  4 Monocryl was used to close the skin.  The bulb was placed to suction on the drain.  Benzoin and Steri-Strips were applied.  A sterile dressing is applied.  The patient was extubated and brought to the recovery room in stable condition.  All sponge, instrument, and needle counts are correct.  Wilmon Arms. Corliss Skains, MD, [facility] Surgery  General Surgery   09/14/2022 3:05 PM

## 2022-09-14 NOTE — Anesthesia Postprocedure Evaluation (Signed)
Anesthesia Post Note  Patient: DONISHA FRACASSI  Procedure(s) Performed: RIGHT MASTECTOMY WITH SENTINEL LYMPH NODE BIOPSY (Right)     Patient location during evaluation: PACU Anesthesia Type: General Level of consciousness: awake and alert Pain management: pain level controlled Vital Signs Assessment: post-procedure vital signs reviewed and stable Respiratory status: spontaneous breathing, nonlabored ventilation, respiratory function stable and patient connected to nasal cannula oxygen Cardiovascular status: blood pressure returned to baseline and stable Postop Assessment: no apparent nausea or vomiting Anesthetic complications: no  No notable events documented.  Last Vitals:  Vitals:   09/14/22 1530 09/14/22 1545  BP: 107/65 121/74  Pulse: 76 76  Resp: 11 12  Temp:    SpO2: 93% 95%    Last Pain:  Vitals:   09/14/22 1530  TempSrc:   PainSc: 5                  Kennieth Rad

## 2022-09-14 NOTE — Transfer of Care (Signed)
Immediate Anesthesia Transfer of Care Note  Patient: Cheryl Knight  Procedure(s) Performed: RIGHT MASTECTOMY WITH SENTINEL LYMPH NODE BIOPSY (Right)  Patient Location: PACU  Anesthesia Type:General  Level of Consciousness: awake, drowsy, and patient cooperative  Airway & Oxygen Therapy: Patient Spontanous Breathing  Post-op Assessment: Report given to RN, Post -op Vital signs reviewed and stable, and Patient moving all extremities X 4  Post vital signs: Reviewed and stable  Last Vitals:  Vitals Value Taken Time  BP 124/78 09/14/22 1506  Temp 37.2 C 09/14/22 1506  Pulse 82 09/14/22 1508  Resp 12 09/14/22 1508  SpO2 97 % 09/14/22 1508  Vitals shown include unfiled device data.  Last Pain:  Vitals:   09/14/22 1220  TempSrc:   PainSc: 0-No pain         Complications: No notable events documented.

## 2022-09-14 NOTE — Progress Notes (Signed)
Pt arrived to 6 north room 18 alert and oriented x4. Pain level 2/10. Incision site covered with gauze and tape clean, dry and intact.  Pt ambulated from stretcher to bed independently with no issue. Bed in lowest position. Call light in reach. Will continue to monitor pt.

## 2022-09-14 NOTE — Anesthesia Procedure Notes (Signed)
Anesthesia Regional Block: Pectoralis block   Pre-Anesthetic Checklist: , timeout performed,  Correct Patient, Correct Site, Correct Laterality,  Correct Procedure, Correct Position, site marked,  Risks and benefits discussed,  Surgical consent,  Pre-op evaluation,  At surgeon's request and post-op pain management  Laterality: Right  Prep: chloraprep       Needles:  Injection technique: Single-shot  Needle Type: Echogenic Needle     Needle Length: 9cm  Needle Gauge: 21     Additional Needles:   Procedures:,,,, ultrasound used (permanent image in chart),,    Narrative:  Start time: 09/14/2022 12:25 PM End time: 09/14/2022 12:33 PM Injection made incrementally with aspirations every 5 mL.  Performed by: Personally  Anesthesiologist: Marcene Duos, MD

## 2022-09-15 ENCOUNTER — Encounter (HOSPITAL_COMMUNITY): Payer: Self-pay | Admitting: Surgery

## 2022-09-15 DIAGNOSIS — E119 Type 2 diabetes mellitus without complications: Secondary | ICD-10-CM | POA: Diagnosis not present

## 2022-09-15 DIAGNOSIS — Z87891 Personal history of nicotine dependence: Secondary | ICD-10-CM | POA: Diagnosis not present

## 2022-09-15 DIAGNOSIS — Z85828 Personal history of other malignant neoplasm of skin: Secondary | ICD-10-CM | POA: Diagnosis not present

## 2022-09-15 DIAGNOSIS — Z170 Estrogen receptor positive status [ER+]: Secondary | ICD-10-CM | POA: Diagnosis not present

## 2022-09-15 DIAGNOSIS — D0511 Intraductal carcinoma in situ of right breast: Secondary | ICD-10-CM | POA: Diagnosis not present

## 2022-09-15 DIAGNOSIS — I10 Essential (primary) hypertension: Secondary | ICD-10-CM | POA: Diagnosis not present

## 2022-09-15 DIAGNOSIS — Z79899 Other long term (current) drug therapy: Secondary | ICD-10-CM | POA: Diagnosis not present

## 2022-09-15 DIAGNOSIS — Z853 Personal history of malignant neoplasm of breast: Secondary | ICD-10-CM | POA: Diagnosis not present

## 2022-09-15 LAB — GLUCOSE, CAPILLARY: Glucose-Capillary: 168 mg/dL — ABNORMAL HIGH (ref 70–99)

## 2022-09-15 MED ORDER — OXYCODONE HCL 5 MG PO TABS: 5.0000 mg | ORAL_TABLET | ORAL | 0 refills | Status: DC | PRN

## 2022-09-15 NOTE — Discharge Instructions (Signed)
[facility] [phone]  MASTECTOMY: POST OP INSTRUCTIONS  Always review your discharge instruction sheet given to you by the facility where your surgery was performed. IF YOU HAVE DISABILITY OR FAMILY LEAVE FORMS, YOU MUST BRING THEM TO THE OFFICE FOR PROCESSING.   DO NOT GIVE THEM TO YOUR DOCTOR. A prescription for pain medication may be given to you upon discharge.  Take your pain medication as prescribed, if needed.  If narcotic pain medicine is not needed, then you may take acetaminophen (Tylenol) or ibuprofen (Advil) as needed. Take your usually prescribed medications unless otherwise directed. If you need a refill on your pain medication, please contact your pharmacy.  They will contact our office to request authorization.  Prescriptions will not be filled after 5pm or on week-ends. You should follow a light diet the first few days after arrival home, such as soup and crackers, etc.  Resume your normal diet the day after surgery. Most patients will experience some swelling and bruising on the chest and underarm.  Ice packs will help.  Swelling and bruising can take several days to resolve.  It is common to experience some constipation if taking pain medication after surgery.  Increasing fluid intake and taking a stool softener (such as Colace) will usually help or prevent this problem from occurring.  A mild laxative (Milk of Magnesia or Miralax) should be taken according to package instructions if there are no bowel movements after 48 hours. Unless discharge instructions indicate otherwise, leave your bandage dry and in place until your next appointment in 3-5 days.  You may take a limited sponge bath.  No tube baths or showers until the drains are removed.  You may have steri-strips (small skin tapes) in place directly over the incision.  These strips should be left on the skin for 7-10 days.  If your surgeon used skin glue on the incision, you may shower in 24 hours.   The glue will flake off over the next 2-3 weeks.  Any sutures or staples will be removed at the office during your follow-up visit. DRAINS:  If you have drains in place, it is important to keep a list of the amount of drainage produced each day in your drains.  Before leaving the hospital, you should be instructed on drain care.  Call our office if you have any questions about your drains. ACTIVITIES:  You may resume regular (light) daily activities beginning the next day--such as daily self-care, walking, climbing stairs--gradually increasing activities as tolerated.  You may have sexual intercourse when it is comfortable.  Refrain from any heavy lifting or straining until approved by your doctor. You may drive when you are no longer taking prescription pain medication, you can comfortably wear a seatbelt, and you can safely maneuver your car and apply brakes. RETURN TO WORK:  __________________________________________________________ You should see your doctor in the office for a follow-up appointment approximately 3-5 days after your surgery.  Your doctor's nurse will typically make your follow-up appointment when she calls you with your pathology report.  Expect your pathology report 2-3 business days after your surgery.  You may call to check if you do not hear from us after three days.   OTHER INSTRUCTIONS: ______________________________________________________________________________________________ ____________________________________________________________________________________________ WHEN TO CALL YOUR DOCTOR: Fever over 101.0 Nausea and/or vomiting Extreme swelling or bruising Continued bleeding from incision. Increased pain, redness, or drainage from the incision. The clinic staff is available to answer your questions during regular business hours.  Please don't hesitate   to call and ask to speak to one of the nurses for clinical concerns.  If you have a medical emergency, go to the  nearest emergency room or call 911.  A surgeon from [facility] is always on call at the hospital. [address], [address], [place], [place]  [address] ? [address] [address], [place], [place]   [address] [phone] ? [phone] ? FAX [phone] Web site: [email or web address]  

## 2022-09-15 NOTE — Progress Notes (Signed)
Patient educated on drain care and how to record output. Given opportunity to ask questions. D/C packet reviewed. No needs noted.

## 2022-09-15 NOTE — Discharge Summary (Signed)
Physician Discharge Summary  Patient ID: Cheryl Knight MRN: [id number] DOB/AGE: [birth year] 69 y.o.  Admit date: 09/14/2022 Discharge date: 09/15/2022  Admission Diagnoses:  DCIS right breast  Discharge Diagnoses: same Principal Problem:   Ductal carcinoma in situ (DCIS) of right breast   Discharged Condition: good  Hospital Course: This is a 67 year old female who was diagnosed in 02/2019 with Grade 1 invasive ductal carcinoma of the right breast.  Her tumor was located at 0700. ER/PR positive, her 2 negative.  She underwent right RSL/ SLNB on 03/28/19.  Margins were negative/ SLNx 2 negative.  She received radiation therapy and is currently on Tamoxifen.     Recently she had a mammogram that revealed a focal asymmetry in the right breast at 12:00.  Work-up revealed a 1.4 x 0.8 x 1.4 cm mass 5-6 cmfn.  Biopsy revealed DCIS high-grade/ ER positive/ PR negative with suspicion for microinvasion.  On 09/14/22, she underwent right mastectomy with sentinel lymph node biopsy.  She did well overnight with good pain control.  Treatments: surgery: right mastectomy with SLNB  Discharge Exam: Blood pressure 113/62, pulse 92, temperature 98.9 F (37.2 C), temperature source Oral, resp. rate 16, height 5\' 8"  (1.727 m), weight 88.5 kg, SpO2 97%. General appearance: alert, cooperative, and no distress Right chest - skin flaps viable; no hematoma Drain - thin serosanguinous output  Disposition: Discharge disposition: 01-Home or Self Care       Discharge Instructions     Call MD for:  persistant nausea and vomiting   Complete by: As directed    Call MD for:  redness, tenderness, or signs of infection (pain, swelling, redness, odor or green/yellow discharge around incision site)   Complete by: As directed    Call MD for:  severe uncontrolled pain   Complete by: As directed    Call MD for:  temperature >100.4   Complete by: As directed    Diet general   Complete by: As directed     Discharge wound care:   Complete by: As directed    Sponge baths only until the drain is removed Empty and record drain output twice a day.  Bring these numbers with you to your first appointment. Wear the compression garment as much as possible   Driving Restrictions   Complete by: As directed    Do not drive while taking pain medications   Increase activity slowly   Complete by: As directed       Allergies as of 09/15/2022       Reactions   Pentazocine Lactate Other (See Comments)   Hallucinations   Tape    If left on long enough causes skin irritation    Demerol [meperidine] Nausea And Vomiting   Lisinopril Cough        Medication List     TAKE these medications    amLODipine 2.5 MG tablet Commonly known as: NORVASC TAKE 1 TABLET(2.5 MG) BY MOUTH DAILY   aspirin-acetaminophen-caffeine 250-250-65 MG tablet Commonly known as: EXCEDRIN MIGRAINE Take 2 tablets by mouth every 8 (eight) hours as needed for headache.   CALCIUM 600 + D PO Take 1 tablet by mouth 2 (two) times daily.   Calcium Carbonate Antacid 1177 MG Chew Chew 1,177 mg by mouth at bedtime.   [employer] NATURAL TEARS PF OP Place 1 drop into both eyes See admin instructions. Instill 1 drop into both eyes up to 6 times daily as needed for dry eyes   cyanocobalamin 1000 MCG  tablet Commonly known as: VITAMIN B12 Take 1,000 mcg by mouth every other day.   esomeprazole 40 MG capsule Commonly known as: NEXIUM TAKE 1 CAPSULE(40 MG) BY MOUTH DAILY   famotidine 20 MG tablet Commonly known as: Pepcid Take 1 tablet (20 mg total) by mouth 2 (two) times daily. What changed: when to take this   Fish Oil 1000 MG Caps Take 1,000 mg by mouth daily.   glucosamine-chondroitin 500-400 MG tablet Take 1 tablet by mouth 2 (two) times daily.   leflunomide 20 MG tablet Commonly known as: ARAVA TAKE 1 TABLET(20 MG) BY MOUTH DAILY   levothyroxine 100 MCG tablet Commonly known as: SYNTHROID TAKE 1 TABLET BY MOUTH EVERY  DAY 30 MINUTES BEFORE FOOD   loratadine 10 MG tablet Commonly known as: CLARITIN Take 10 mg by mouth daily.   Melatonin 10 MG Caps Take 10 mg by mouth at bedtime.   metFORMIN 500 MG 24 hr tablet Commonly known as: GLUCOPHAGE-XR TAKE 1 TABLET(500 MG) BY MOUTH DAILY WITH BREAKFAST   Miebo 1.338 GM/ML Soln Generic drug: Perfluorohexyloctane Place 1 drop into both eyes in the morning and at bedtime.   multivitamin with minerals Tabs tablet Take 1 tablet by mouth daily.   olmesartan-hydrochlorothiazide 20-12.5 MG tablet Commonly known as: BENICAR HCT Take 1 tablet by mouth every morning.   oxyCODONE 5 MG immediate release tablet Commonly known as: Oxy IR/ROXICODONE Take 1-2 tablets (5-10 mg total) by mouth every 4 (four) hours as needed for moderate pain.   promethazine 25 MG tablet Commonly known as: PHENERGAN Take 25 mg by mouth every 6 (six) hours as needed for nausea or vomiting.   rizatriptan 10 MG tablet Commonly known as: MAXALT Take 10 mg by mouth as needed for migraine. May repeat in 2 hours if needed   simvastatin 40 MG tablet Commonly known as: ZOCOR Take 1 tablet (40 mg total) by mouth at bedtime. After 6 pm d/c pravachol 40   tamoxifen 20 MG tablet Commonly known as: NOLVADEX TAKE 1 TABLET(20 MG) BY MOUTH DAILY   TheraTears Nighttime 1 % Gel Generic drug: Carboxymethylcellulose Sod PF Place 1 Application into both eyes at bedtime.   tiZANidine 4 MG tablet Commonly known as: ZANAFLEX Take 1 tablet (4 mg total) by mouth every 6 (six) hours as needed for muscle spasms.   topiramate 100 MG tablet Commonly known as: TOPAMAX TAKE 1 TABLET(100 MG) BY MOUTH DAILY   vitamin C 1000 MG tablet Take 1,000 mg by mouth daily.               Discharge Care Instructions  (From admission, onward)           Start     Ordered   09/15/22 0000  Discharge wound care:       Comments: Sponge baths only until the drain is removed Empty and record drain output  twice a day.  Bring these numbers with you to your first appointment. Wear the compression garment as much as possible   09/15/22 0754            Follow-up Information     Manus Rudd, MD Follow up in 1 week(s).   Specialty: General Surgery Why: For wound re-check and drain removal. Contact information: [address] [place] [address] [phone]                 Signed: Wynona Luna 09/15/2022, 7:55 AM

## 2022-09-15 NOTE — Plan of Care (Signed)
Problem: Education: Goal: Ability to describe self-care measures that may prevent or decrease complications (Diabetes Survival Skills Education) will improve 09/15/2022 0930 by Letta Moynahan, RN Outcome: Adequate for Discharge 09/15/2022 0930 by Letta Moynahan, RN Outcome: Adequate for Discharge Goal: Individualized Educational Video(s) 09/15/2022 0930 by Letta Moynahan, RN Outcome: Adequate for Discharge 09/15/2022 0930 by Letta Moynahan, RN Outcome: Adequate for Discharge   Problem: Coping: Goal: Ability to adjust to condition or change in health will improve 09/15/2022 0930 by Letta Moynahan, RN Outcome: Adequate for Discharge 09/15/2022 0930 by Letta Moynahan, RN Outcome: Adequate for Discharge   Problem: Fluid Volume: Goal: Ability to maintain a balanced intake and output will improve 09/15/2022 0930 by Letta Moynahan, RN Outcome: Adequate for Discharge 09/15/2022 0930 by Letta Moynahan, RN Outcome: Adequate for Discharge   Problem: Health Behavior/Discharge Planning: Goal: Ability to identify and utilize available resources and services will improve 09/15/2022 0930 by Letta Moynahan, RN Outcome: Adequate for Discharge 09/15/2022 0930 by Letta Moynahan, RN Outcome: Adequate for Discharge Goal: Ability to manage health-related needs will improve 09/15/2022 0930 by Letta Moynahan, RN Outcome: Adequate for Discharge 09/15/2022 0930 by Letta Moynahan, RN Outcome: Adequate for Discharge   Problem: Metabolic: Goal: Ability to maintain appropriate glucose levels will improve 09/15/2022 0930 by Letta Moynahan, RN Outcome: Adequate for Discharge 09/15/2022 0930 by Letta Moynahan, RN Outcome: Adequate for Discharge   Problem: Nutritional: Goal: Maintenance of adequate nutrition will improve 09/15/2022 0930 by Letta Moynahan, RN Outcome: Adequate for Discharge 09/15/2022 0930 by Letta Moynahan, RN Outcome: Adequate for Discharge Goal: Progress toward achieving an optimal weight will  improve 09/15/2022 0930 by Letta Moynahan, RN Outcome: Adequate for Discharge 09/15/2022 0930 by Letta Moynahan, RN Outcome: Adequate for Discharge   Problem: Skin Integrity: Goal: Risk for impaired skin integrity will decrease 09/15/2022 0930 by Letta Moynahan, RN Outcome: Adequate for Discharge 09/15/2022 0930 by Letta Moynahan, RN Outcome: Adequate for Discharge   Problem: Tissue Perfusion: Goal: Adequacy of tissue perfusion will improve 09/15/2022 0930 by Letta Moynahan, RN Outcome: Adequate for Discharge 09/15/2022 0930 by Letta Moynahan, RN Outcome: Adequate for Discharge   Problem: Education: Goal: Knowledge of General Education information will improve Description: Including pain rating scale, medication(s)/side effects and non-pharmacologic comfort measures 09/15/2022 0930 by Letta Moynahan, RN Outcome: Adequate for Discharge 09/15/2022 0930 by Letta Moynahan, RN Outcome: Adequate for Discharge   Problem: Health Behavior/Discharge Planning: Goal: Ability to manage health-related needs will improve 09/15/2022 0930 by Letta Moynahan, RN Outcome: Adequate for Discharge 09/15/2022 0930 by Letta Moynahan, RN Outcome: Adequate for Discharge   Problem: Clinical Measurements: Goal: Ability to maintain clinical measurements within normal limits will improve 09/15/2022 0930 by Letta Moynahan, RN Outcome: Adequate for Discharge 09/15/2022 0930 by Letta Moynahan, RN Outcome: Adequate for Discharge Goal: Will remain free from infection 09/15/2022 0930 by Letta Moynahan, RN Outcome: Adequate for Discharge 09/15/2022 0930 by Letta Moynahan, RN Outcome: Adequate for Discharge Goal: Diagnostic test results will improve 09/15/2022 0930 by Letta Moynahan, RN Outcome: Adequate for Discharge 09/15/2022 0930 by Letta Moynahan, RN Outcome: Adequate for Discharge Goal: Respiratory complications will improve 09/15/2022 0930 by Letta Moynahan, RN Outcome: Adequate for Discharge 09/15/2022 0930 by  Letta Moynahan, RN Outcome: Adequate for Discharge Goal: Cardiovascular complication will be avoided 09/15/2022 0930 by Letta Moynahan, RN Outcome:  Adequate for Discharge 09/15/2022 0930 by Letta Moynahan, RN Outcome: Adequate for Discharge   Problem: Activity: Goal: Risk for activity intolerance will decrease 09/15/2022 0930 by Letta Moynahan, RN Outcome: Adequate for Discharge 09/15/2022 0930 by Letta Moynahan, RN Outcome: Adequate for Discharge   Problem: Nutrition: Goal: Adequate nutrition will be maintained 09/15/2022 0930 by Letta Moynahan, RN Outcome: Adequate for Discharge 09/15/2022 0930 by Letta Moynahan, RN Outcome: Adequate for Discharge   Problem: Coping: Goal: Level of anxiety will decrease 09/15/2022 0930 by Letta Moynahan, RN Outcome: Adequate for Discharge 09/15/2022 0930 by Letta Moynahan, RN Outcome: Adequate for Discharge   Problem: Elimination: Goal: Will not experience complications related to bowel motility 09/15/2022 0930 by Letta Moynahan, RN Outcome: Adequate for Discharge 09/15/2022 0930 by Letta Moynahan, RN Outcome: Adequate for Discharge Goal: Will not experience complications related to urinary retention 09/15/2022 0930 by Letta Moynahan, RN Outcome: Adequate for Discharge 09/15/2022 0930 by Letta Moynahan, RN Outcome: Adequate for Discharge   Problem: Pain Managment: Goal: General experience of comfort will improve 09/15/2022 0930 by Letta Moynahan, RN Outcome: Adequate for Discharge 09/15/2022 0930 by Letta Moynahan, RN Outcome: Adequate for Discharge   Problem: Safety: Goal: Ability to remain free from injury will improve 09/15/2022 0930 by Letta Moynahan, RN Outcome: Adequate for Discharge 09/15/2022 0930 by Letta Moynahan, RN Outcome: Adequate for Discharge   Problem: Skin Integrity: Goal: Risk for impaired skin integrity will decrease 09/15/2022 0930 by Letta Moynahan, RN Outcome: Adequate for Discharge 09/15/2022 0930 by Letta Moynahan,  RN Outcome: Adequate for Discharge

## 2022-09-17 DIAGNOSIS — C50911 Malignant neoplasm of unspecified site of right female breast: Secondary | ICD-10-CM | POA: Diagnosis not present

## 2022-09-21 DIAGNOSIS — C50911 Malignant neoplasm of unspecified site of right female breast: Secondary | ICD-10-CM | POA: Diagnosis not present

## 2022-09-22 ENCOUNTER — Encounter: Payer: Self-pay | Admitting: *Deleted

## 2022-09-22 DIAGNOSIS — C50511 Malignant neoplasm of lower-outer quadrant of right female breast: Secondary | ICD-10-CM

## 2022-09-23 LAB — SURGICAL PATHOLOGY

## 2022-09-27 DIAGNOSIS — D225 Melanocytic nevi of trunk: Secondary | ICD-10-CM | POA: Diagnosis not present

## 2022-09-27 DIAGNOSIS — Z1283 Encounter for screening for malignant neoplasm of skin: Secondary | ICD-10-CM | POA: Diagnosis not present

## 2022-10-14 ENCOUNTER — Other Ambulatory Visit: Payer: Self-pay | Admitting: Family Medicine

## 2022-10-14 ENCOUNTER — Telehealth: Payer: Self-pay

## 2022-10-14 DIAGNOSIS — E785 Hyperlipidemia, unspecified: Secondary | ICD-10-CM

## 2022-10-14 MED ORDER — SIMVASTATIN 40 MG PO TABS
40.0000 mg | ORAL_TABLET | Freq: Every day | ORAL | 3 refills | Status: DC
Start: 2022-10-14 — End: 2023-10-12

## 2022-10-14 NOTE — Telephone Encounter (Signed)
Prescription Request  10/14/2022  LOV: Visit date not found  What is the name of the medication or equipment? simvastatin (ZOCOR) 40 MG tablet  Have you contacted your pharmacy to request a refill? Yes   Which pharmacy would you like this sent to?  [facility] [phone] - [place], [place] - [address] AT [facility] [address] [place] [address] Phone: [phone] Fax: [phone]    Patient notified that their request is being sent to the clinical staff for review and that they should receive a response within 2 business days.   Please advise at Mobile [phone] (mobile)  Patient states she is out of this medication.

## 2022-10-27 ENCOUNTER — Ambulatory Visit: Payer: Medicare HMO | Admitting: Pulmonary Disease

## 2022-10-27 VITALS — BP 124/78 | HR 77 | Temp 97.9°F | Ht 68.0 in | Wt 197.8 lb

## 2022-10-27 DIAGNOSIS — R059 Cough, unspecified: Secondary | ICD-10-CM | POA: Diagnosis not present

## 2022-10-27 DIAGNOSIS — J849 Interstitial pulmonary disease, unspecified: Secondary | ICD-10-CM

## 2022-10-27 DIAGNOSIS — C50911 Malignant neoplasm of unspecified site of right female breast: Secondary | ICD-10-CM | POA: Diagnosis not present

## 2022-10-27 DIAGNOSIS — M0579 Rheumatoid arthritis with rheumatoid factor of multiple sites without organ or systems involvement: Secondary | ICD-10-CM

## 2022-10-27 DIAGNOSIS — Z171 Estrogen receptor negative status [ER-]: Secondary | ICD-10-CM

## 2022-10-27 NOTE — Patient Instructions (Addendum)
I will see if the [employer] company can send a phlebotomist to your house.  That way we can draw all the blood work that we need.  Your CT chest and your breathing test were good.  We will need to repeat breathing tests in a year from your most recent breathing test this will be around August 2025.  We will see you following that time.

## 2022-10-27 NOTE — Progress Notes (Unsigned)
Subjective:    Patient ID: Cheryl Knight, female    DOB: December 11, 1955, 67 y.o.   MRN: [id number]  Patient Care Team: Dana Allan, MD as PCP - General (Family Medicine) Dorothy Puffer, MD as Consulting Physician (Radiation Oncology) Malachy Mood, MD as Consulting Physician (Hematology) Manus Rudd, MD as Consulting Physician (General Surgery) Pershing Proud, RN as Oncology Nurse Navigator Donnelly Angelica, RN as Oncology Nurse Navigator Pollyann Samples, NP as Nurse Practitioner (Nurse Practitioner)  No chief complaint on file.   HPI Patient is a 67 year old remote former smoker (total of approximately 7-pack-year history) who follows up for the issue of mild fibrotic changes noted on high-resolution CT of the chest and cough.  She was last seen 05 July 2022 after a 2-year hiatus from the practice and at that time was seen for follow-up on her mild fibrotic changes on high-resolution CT.  For the details of her last visit please refer to that note.  Recall that she has a history of rheumatoid arthritis and is currently on Arava.  She also has significant gastroesophageal reflux with active regurgitation and follows with GI.  She has been followed by her rheumatologist Dr. Corliss Skains who recommended follow-up with our office since she had not been seen since 2022.  Patient states that she has been feeling well without shortness of breath or wheezing and did not feel that she needed follow-up here.  She has for the most part good control of her cough unless she has issues with her reflux after a heavy meal.  For the most part though her cough is very well-controlled.  She does not endorse any progressive shortness of breath.  No orthopnea or paroxysmal nocturnal dyspnea.  No lower extremity edema.  She follows antireflux measures but even with this still has breakthrough issues with GERD.  That she has rheumatoid arthritis she has been on [place] and is tolerating this.   She does not endorse any  other symptomatology today.  Overall she feels well and looks well.  29 July 2022 chest high-resolution very mild subpleural reticular densities and traction bronchiectasis in the lateral aspects stable since 2021 may be due to NSIP air trapping indicative of small airways disease mild cylindrical bronchiectasis tracheobronchomalacia fatty infiltration of the liver  29 July 2022 PFTs very minimal obstructive airways disease, diffusion capacity measurements were invalid.  *Underwent right mastectomy on 14 September 2022 with sentinel lymph node biopsy.  This was due to recurrence of carcinoma on the right breast.  Tumor was invasive ductal carcinoma grade 2-3 HER2 positive ER negative PR negative. Review of Systems A 10 point review of systems was performed and it is as noted above otherwise negative.   Patient Active Problem List   Diagnosis Date Noted  . Ductal carcinoma in situ (DCIS) of right breast 08/26/2022  . ILD (interstitial lung disease) (HCC) 07/05/2022  . Chronic back pain 11/07/2021  . Fatty liver 06/24/2020  . Pulmonary fibrosis (HCC) 10/01/2019  . Pulmonary nodules 10/01/2019  . Bronchiectasis without complication (HCC) 10/01/2019  . Aortic atherosclerosis (HCC) 10/01/2019  . Hypertension associated with diabetes (HCC) 10/01/2019  . Paresthesia of upper extremity 10/01/2019  . Hiatal hernia 08/16/2019  . Dry eye syndrome 08/05/2019  . Glaucoma 08/05/2019  . Hypertensive retinopathy 08/05/2019  . Arteriosclerosis 08/05/2019  . Pterygium 08/05/2019  . Ocular rosacea 08/05/2019  . Malignant neoplasm of lower-outer quadrant of right breast of female, estrogen receptor positive (HCC) 03/19/2019  . Type 2 diabetes mellitus  with hyperglycemia, without long-term current use of insulin (HCC) 02/08/2019  . Allergic rhinitis 02/08/2019  . Osteoarthritis   . Obesity (BMI 30-39.9) 12/30/2015  . Basal cell carcinoma of chest wall 10/05/2011  . Hyperlipidemia 02/27/2008  .  Hypothyroidism 02/21/2007  . Migraine 02/21/2007  . VAGINITIS, ATROPHIC 02/21/2007  . Psoriasis 02/21/2007  . Sicca syndrome, unspecified 02/21/2007  . GERD 06/29/2006  . Rheumatoid arthritis (HCC) 06/29/2006    Social History   Tobacco Use  . Smoking status: Former    Current packs/day: 0.00    Average packs/day: 0.3 packs/day for 7.0 years (1.8 ttl pk-yrs)    Types: Cigarettes    Start date: 7    Quit date: 2000    Years since quitting: 24.7    Passive exposure: Past  . Smokeless tobacco: Never  Substance Use Topics  . Alcohol use: Yes    Comment: rare- with birthdays and beach trips    Allergies  Allergen Reactions  . Pentazocine Lactate Other (See Comments)    Hallucinations  . Tape     If left on long enough causes skin irritation   . Demerol [Meperidine] Nausea And Vomiting  . Lisinopril Cough    No outpatient medications have been marked as taking for the 10/27/22 encounter (Appointment) with Salena Saner, MD.    Immunization History  Administered Date(s) Administered  . Fluad Quad(high Dose 65+) 03/11/2020, 10/29/2021  . H1N1 02/27/2008  . Influenza-Unspecified 11/18/2017  . PFIZER(Purple Top)SARS-COV-2 Vaccination 03/02/2019, 03/29/2019, 01/01/2020  . PNEUMOCOCCAL CONJUGATE-20 07/22/2021  . [employer] 76yrs & up 02/02/2021  . Pneumococcal Conjugate-13 10/03/2019  . Td 01/19/2004  . Tdap 12/17/2014        Objective:     There were no vitals taken for this visit.     GENERAL:  Overweight woman, no acute distress.  Fully ambulatory. HEAD: Normocephalic, atraumatic.  EYES: Pupils equal, round, reactive to light.  No scleral icterus.  MOUTH: Nose/mouth/throat not examined due to masking requirements for COVID 19. NECK: Supple. No thyromegaly. Trachea midline. No JVD.  No adenopathy. PULMONARY: Good air entry bilaterally, no adventitious sounds.   CARDIOVASCULAR: S1 and S2. Regular rate and rhythm.  Grade 1/6  systolic ejection murmur left sternal border, unchanged from prior. GASTROINTESTINAL: Obese, benign. MUSCULOSKELETAL: No joint deformity, no clubbing, no edema.  NEUROLOGIC: Awake, alert, oriented, fully ambulatory no gait disturbance noted.  Speech is fluent, no overt focal deficits. SKIN: Intact,warm,dry.  Limited exam shows no rashes. PSYCH: Mood and behavior appropriate        Assessment & Plan:   No diagnosis found.  No orders of the defined types were placed in this encounter.   No orders of the defined types were placed in this encounter.     Gailen Shelter, MD Advanced Bronchoscopy PCCM [facility]    *This note was dictated using voice recognition software/Dragon.  Despite best efforts to proofread, errors can occur which can change the meaning. Any transcriptional errors that result from this process are unintentional and may not be fully corrected at the time of dictation.

## 2022-10-28 ENCOUNTER — Encounter: Payer: Self-pay | Admitting: Pulmonary Disease

## 2022-11-02 ENCOUNTER — Telehealth: Payer: Self-pay | Admitting: Family Medicine

## 2022-11-02 ENCOUNTER — Ambulatory Visit (INDEPENDENT_AMBULATORY_CARE_PROVIDER_SITE_OTHER): Payer: Medicare HMO | Admitting: *Deleted

## 2022-11-02 VITALS — BP 126/76 | HR 84 | Temp 97.4°F | Ht 68.0 in | Wt 201.2 lb

## 2022-11-02 DIAGNOSIS — Z0000 Encounter for general adult medical examination without abnormal findings: Secondary | ICD-10-CM | POA: Diagnosis not present

## 2022-11-02 NOTE — Telephone Encounter (Signed)
Pt request labs before her 10/21 appointment

## 2022-11-02 NOTE — Patient Instructions (Addendum)
Cheryl Knight , Thank you for taking time to come for your Medicare Wellness Visit. I appreciate your ongoing commitment to your health goals. Please review the following plan we discussed and let me know if I can assist you in the future.   Referrals/Orders/Follow-Ups/Clinician Recommendations: Remember to get your flu shot and consider the shingles vaccines  This is a list of the screening recommended for you and due dates:  Health Maintenance  Topic Date Due   Zoster (Shingles) Vaccine (1 of 2) Never done   Complete foot exam   12/25/2021   Yearly kidney health urinalysis for diabetes  07/23/2022   Flu Shot  08/19/2022   COVID-19 Vaccine (5 - 2023-24 season) 09/19/2022   Eye exam for diabetics  02/16/2023   Hemoglobin A1C  03/10/2023   Yearly kidney function blood test for diabetes  09/07/2023   Medicare Annual Wellness Visit  11/02/2023   Mammogram  08/04/2024   DTaP/Tdap/Td vaccine (3 - Td or Tdap) 12/16/2024   Colon Cancer Screening  08/13/2029   Pneumonia Vaccine  Completed   DEXA scan (bone density measurement)  Completed   Hepatitis C Screening  Completed   HPV Vaccine  Aged Out    Advanced directives: (In Chart) A copy of your advanced directives are scanned into your chart should your provider ever need it.  Next Medicare Annual Wellness Visit scheduled for next year: Yes  11/07/23 @ 10:30   Managing Pain Without Opioids  Opioids are strong medicines used to treat moderate to severe pain. For some people, especially those who have long-term (chronic) pain, opioids may not be the best choice for pain management due to: Side effects like nausea, constipation, and sleepiness. The risk of addiction (opioid use disorder). The longer you take opioids, the greater your risk of addiction. Pain that lasts for more than 3 months is called chronic pain. Managing chronic pain usually requires more than one approach and is often provided by a team of health care providers working  together (multidisciplinary approach). Pain management may be done at a pain management center or pain clinic. How to manage pain without the use of opioids Use non-opioid medicines Non-opioid medicines for pain may include: Over-the-counter or prescription non-steroidal anti-inflammatory drugs (NSAIDs). These may be the first medicines used for pain. They work well for muscle and bone pain, and they reduce swelling. Acetaminophen. This over-the-counter medicine may work well for milder pain but not swelling. Antidepressants. These may be used to treat chronic pain. A certain type of antidepressant (tricyclics) is often used. These medicines are given in lower doses for pain than when used for depression. Anticonvulsants. These are usually used to treat seizures but may also reduce nerve (neuropathic) pain. Muscle relaxants. These relieve pain caused by sudden muscle tightening (spasms). You may also use a pain medicine that is applied to the skin as a patch, cream, or gel (topical analgesic), such as a numbing medicine. These may cause fewer side effects than medicines taken by mouth. Do certain therapies as directed Some therapies can help with pain management. They include: Physical therapy. You will do exercises to gain strength and flexibility. A physical therapist may teach you exercises to move and stretch parts of your body that are weak, stiff, or painful. You can learn these exercises at physical therapy visits and practice them at home. Physical therapy may also involve: Massage. Heat wraps or applying heat or cold to affected areas. Electrical signals that interrupt pain signals (transcutaneous electrical nerve stimulation,  TENS). Weak lasers that reduce pain and swelling (low-level laser therapy). Signals from your body that help you learn to regulate pain (biofeedback). Occupational therapy. This helps you to learn ways to function at home and work with less pain. Recreational  therapy. This involves trying new activities or hobbies, such as a physical activity or drawing. Mental health therapy, including: Cognitive behavioral therapy (CBT). This helps you learn coping skills for dealing with pain. Acceptance and commitment therapy (ACT) to change the way you think and react to pain. Relaxation therapies, including muscle relaxation exercises and mindfulness-based stress reduction. Pain management counseling. This may be individual, family, or group counseling.  Receive medical treatments Medical treatments for pain management include: Nerve block injections. These may include a pain blocker and anti-inflammatory medicines. You may have injections: Near the spine to relieve chronic back or neck pain. Into joints to relieve back or joint pain. Into nerve areas that supply a painful area to relieve body pain. Into muscles (trigger point injections) to relieve some painful muscle conditions. A medical device placed near your spine to help block pain signals and relieve nerve pain or chronic back pain (spinal cord stimulation device). Acupuncture. Follow these instructions at home Medicines Take over-the-counter and prescription medicines only as told by your health care provider. If you are taking pain medicine, ask your health care providers about possible side effects to watch out for. Do not drive or use heavy machinery while taking prescription opioid pain medicine. Lifestyle  Do not use drugs or alcohol to reduce pain. If you drink alcohol, limit how much you have to: 0-1 drink a day for women who are not pregnant. 0-2 drinks a day for men. Know how much alcohol is in a drink. In the [place] one drink equals one 12 oz bottle of beer (355 mL), one 5 oz glass of wine (148 mL), or one 1 oz glass of hard liquor (44 mL). Do not use any products that contain nicotine or tobacco. These products include cigarettes, chewing tobacco, and vaping devices, such as  e-cigarettes. If you need help quitting, ask your health care provider. Eat a healthy diet and maintain a healthy weight. Poor diet and excess weight may make pain worse. Eat foods that are high in fiber. These include fresh fruits and vegetables, whole grains, and beans. Limit foods that are high in fat and processed sugars, such as fried and sweet foods. Exercise regularly. Exercise lowers stress and may help relieve pain. Ask your health care provider what activities and exercises are safe for you. If your health care provider approves, join an exercise class that combines movement and stress reduction. Examples include yoga and tai chi. Get enough sleep. Lack of sleep may make pain worse. Lower stress as much as possible. Practice stress reduction techniques as told by your therapist. General instructions Work with all your pain management providers to find the treatments that work best for you. You are an important member of your pain management team. There are many things you can do to reduce pain on your own. Consider joining an online or in-person support group for people who have chronic pain. Keep all follow-up visits. This is important. Where to find more information You can find more information about managing pain without opioids from: American Academy of Pain Medicine: painmed.org Institute for Chronic Pain: instituteforchronicpain.org American Chronic Pain Association: theacpa.org Contact a health care provider if: You have side effects from pain medicine. Your pain gets worse or does not get  better with treatments or home therapy. You are struggling with anxiety or depression. Summary Many types of pain can be managed without opioids. Chronic pain may respond better to pain management without opioids. Pain is best managed when you and a team of health care providers work together. Pain management without opioids may include non-opioid medicines, medical treatments, physical  therapy, mental health therapy, and lifestyle changes. Tell your health care providers if your pain gets worse or is not being managed well enough. This information is not intended to replace advice given to you by your health care provider. Make sure you discuss any questions you have with your health care provider. Document Revised: 04/16/2020 Document Reviewed: 04/16/2020 Elsevier Patient Education  2024 [employer].

## 2022-11-02 NOTE — Progress Notes (Signed)
Subjective:   Cheryl Knight is a 67 y.o. female who presents for Medicare Annual (Subsequent) preventive examination.  Visit Complete: In person  Patient Medicare AWV questionnaire was completed by the patient on 10/29/22; I have confirmed that all information answered by patient is correct and no changes since this date.        Objective:    Today's Vitals   11/02/22 1017 11/02/22 1020  BP: 126/76   Pulse: 84   Temp: (!) 97.4 F (36.3 C)   TempSrc: Skin   SpO2: 97%   Weight: 201 lb 4 oz (91.3 kg)   Height: 5\' 8"  (1.727 m)   PainSc:  3    Body mass index is 30.6 kg/m.     11/02/2022   10:38 AM 09/07/2022    9:13 AM 10/26/2021    9:39 AM 10/10/2020    3:13 PM 11/02/2019    9:04 AM 11/01/2019    1:55 PM 10/29/2019    9:58 PM  Advanced Directives  Does Patient Have a Medical Advance Directive? Yes Yes No No No No No  Type of Estate agent of [place];Living will Healthcare Power of [place];Living will       Does patient want to make changes to medical advance directive? No - Patient declined        Copy of Healthcare Power of Attorney in Chart? Yes - validated most recent copy scanned in chart (See row information) No - copy requested       Would patient like information on creating a medical advance directive?   No - Patient declined No - Patient declined No - Patient declined No - Patient declined No - Patient declined    Current Medications (verified) Outpatient Encounter Medications as of 11/02/2022  Medication Sig   amLODipine (NORVASC) 2.5 MG tablet TAKE 1 TABLET(2.5 MG) BY MOUTH DAILY   Ascorbic Acid (VITAMIN C) 1000 MG tablet Take 1,000 mg by mouth daily.   aspirin-acetaminophen-caffeine (EXCEDRIN MIGRAINE) 250-250-65 MG tablet Take 2 tablets by mouth every 8 (eight) hours as needed for headache.   Calcium Carb-Cholecalciferol (CALCIUM 600 + D PO) Take 1 tablet by mouth 2 (two) times daily.   Calcium Carbonate Antacid 1177 MG CHEW  Chew 1,177 mg by mouth at bedtime.   Carboxymethylcellulose Sod PF (THERATEARS NIGHTTIME) 1 % GEL Place 1 Application into both eyes at bedtime.   cyanocobalamin (VITAMIN B12) 1000 MCG tablet Take 1,000 mcg by mouth every other day.   Dextran 70-Hypromellose ([employer] NATURAL TEARS PF OP) Place 1 drop into both eyes See admin instructions. Instill 1 drop into both eyes up to 6 times daily as needed for dry eyes   esomeprazole (NEXIUM) 40 MG capsule TAKE 1 CAPSULE(40 MG) BY MOUTH DAILY   famotidine (PEPCID) 20 MG tablet Take 1 tablet (20 mg total) by mouth 2 (two) times daily. (Patient taking differently: Take 20 mg by mouth at bedtime.)   glucosamine-chondroitin 500-400 MG tablet Take 1 tablet by mouth 2 (two) times daily.   leflunomide (ARAVA) 20 MG tablet TAKE 1 TABLET(20 MG) BY MOUTH DAILY   levothyroxine (SYNTHROID) 100 MCG tablet TAKE 1 TABLET BY MOUTH EVERY DAY 30 MINUTES BEFORE FOOD   loratadine (CLARITIN) 10 MG tablet Take 10 mg by mouth daily.   Melatonin 10 MG CAPS Take 10 mg by mouth at bedtime.   metFORMIN (GLUCOPHAGE-XR) 500 MG 24 hr tablet TAKE 1 TABLET(500 MG) BY MOUTH DAILY WITH BREAKFAST   MIEBO 1.338 GM/ML SOLN  Place 1 drop into both eyes in the morning and at bedtime.   Multiple Vitamin (MULTIVITAMIN WITH MINERALS) TABS tablet Take 1 tablet by mouth daily.   olmesartan-hydrochlorothiazide (BENICAR HCT) 20-12.5 MG tablet Take 1 tablet by mouth every morning.   Omega-3 Fatty Acids (FISH OIL) 1000 MG CAPS Take 1,000 mg by mouth daily.   oxyCODONE (OXY IR/ROXICODONE) 5 MG immediate release tablet Take 1-2 tablets (5-10 mg total) by mouth every 4 (four) hours as needed for moderate pain.   promethazine (PHENERGAN) 25 MG tablet Take 25 mg by mouth every 6 (six) hours as needed for nausea or vomiting.   rizatriptan (MAXALT) 10 MG tablet Take 10 mg by mouth as needed for migraine. May repeat in 2 hours if needed   simvastatin (ZOCOR) 40 MG tablet Take 1 tablet (40 mg total) by mouth at  bedtime.   tamoxifen (NOLVADEX) 20 MG tablet TAKE 1 TABLET(20 MG) BY MOUTH DAILY   tiZANidine (ZANAFLEX) 4 MG tablet Take 1 tablet (4 mg total) by mouth every 6 (six) hours as needed for muscle spasms.   topiramate (TOPAMAX) 100 MG tablet TAKE 1 TABLET(100 MG) BY MOUTH DAILY   No facility-administered encounter medications on file as of 11/02/2022.    Allergies (verified) Pentazocine lactate, Tape, Demerol [meperidine], and Lisinopril   History: Past Medical History:  Diagnosis Date   Cancer (HCC) 02/2019   right breast IDC   Cataracts, bilateral    tbd surgery 2021   Complication of anesthesia    Cough    Diabetes mellitus without complication (HCC)    Difficult intubation    TRACH AT 19   Dry eye syndrome    Dyspnea    W/ COUGHING- was due to lisinopril and septic gallbladder   Family history of adverse reaction to anesthesia    sister coded with propofol use   Family history of breast cancer    Family history of lung cancer    Family history of prostate cancer    Family history of skin cancer    GERD (gastroesophageal reflux disease)    Glaucoma    stable glaucoma suspect 01/19/21 [facility]   Headache    High cholesterol    History of hiatal hernia    History of pneumonia    walking pneumonia "years ago"   Hypertension    Hypothyroidism    Irritable bowel    Osteoarthritis    i.e right knee    PONV (postoperative nausea and vomiting)    Pre-diabetes    Retinopathy    htn 01/19/21 [facility]   Rheumatoid arthritis (HCC)    Squamous cell carcinoma of skin of chest    removed   Thyroid disease    Past Surgical History:  Procedure Laterality Date   ABDOMINAL HYSTERECTOMY     age 54 ovaries out   BREAST LUMPECTOMY WITH RADIOACTIVE SEED AND SENTINEL LYMPH NODE BIOPSY Right 03/28/2019   Procedure: RIGHT BREAST LUMPECTOMY WITH RADIOACTIVE SEED AND SENTINEL LYMPH NODE BIOPSY;  Surgeon: Manus Rudd, MD;  Location: [facility];  Service: General;   Laterality: Right;  PEC BLOCK FOR POST OP PAIN   BREAST SURGERY     CATARACT EXTRACTION, BILATERAL     CHOLECYSTECTOMY     10/2019   COLONOSCOPY     x2   ESOPHAGEAL DILATION     Dr. Juanda Chance 15 years from 2021- x2   MASTECTOMY W/ SENTINEL NODE BIOPSY Right 09/14/2022   Procedure: RIGHT MASTECTOMY WITH  SENTINEL LYMPH NODE BIOPSY;  Surgeon: Manus Rudd, MD;  Location: [facility];  Service: General;  Laterality: Right;  LMA PEC BLOCK   SKIN BIOPSY     04/2022, right arm, left leg   TONSILLECTOMY     67 years old- had trach   TRACHEOSTOMY     67 years old- placed and removed within 24 hours during tonsillectomy   TUBAL LIGATION     Family History  Problem Relation Age of Onset   Hypertension Mother    Thyroid disease Mother    Breast cancer Mother 92       breast dx age 30 died age 64    Prostate cancer Father 19       metastatic   Stroke Maternal Grandmother    Skin cancer Maternal Uncle        dx. in his 59s   Lung cancer Maternal Uncle        smoker, died age 36   Social History   Socioeconomic History   Marital status: Married    Spouse name: Not on file   Number of children: 2   Years of education: Not on file   Highest education level: Not on file  Occupational History   Occupation: nurse  Tobacco Use   Smoking status: Former    Current packs/day: 0.00    Average packs/day: 0.3 packs/day for 7.0 years (1.8 ttl pk-yrs)    Types: Cigarettes    Start date: 12    Quit date: 2000    Years since quitting: 24.8    Passive exposure: Past   Smokeless tobacco: Never  Vaping Use   Vaping status: Never Used  Substance and Sexual Activity   Alcohol use: Yes    Comment: rare- with birthdays and beach trips   Drug use: Never   Sexual activity: Yes  Other Topics Concern   Not on file  Social History Narrative   Nurse at [employer]   Has a beach house    Social Determinants of Health   Financial Resource Strain: Low Risk  (10/29/2022)   Overall Financial Resource  Strain (CARDIA)    Difficulty of Paying Living Expenses: Not hard at all  Food Insecurity: No Food Insecurity (10/29/2022)   Hunger Vital Sign    Worried About Running Out of Food in the Last Year: Never true    Ran Out of Food in the Last Year: Never true  Transportation Needs: No Transportation Needs (10/29/2022)   PRAPARE - Administrator, Civil Service (Medical): No    Lack of Transportation (Non-Medical): No  Physical Activity: Insufficiently Active (10/29/2022)   Exercise Vital Sign    Days of Exercise per Week: 2 days    Minutes of Exercise per Session: 10 min  Stress: No Stress Concern Present (10/29/2022)   [employer] of Occupational Health - Occupational Stress Questionnaire    Feeling of Stress : Only a little  Social Connections: Moderately Isolated (10/29/2022)   Social Connection and Isolation Panel [NHANES]    Frequency of Communication with Friends and Family: Once a week    Frequency of Social Gatherings with Friends and Family: Once a week    Attends Religious Services: Never    Database administrator or Organizations: Yes    Attends Banker Meetings: 1 to 4 times per year    Marital Status: Married    Tobacco Counseling Counseling given: Not Answered   Clinical Intake:  Pre-visit preparation completed:  Yes  Pain : 0-10 Pain Score: 3  Pain Type: Chronic pain Pain Location: Knee Pain Orientation: Right Pain Descriptors / Indicators: Dull Pain Onset: More than a month ago Pain Frequency: Intermittent     BMI - recorded: 30.6 Nutritional Risks: Nausea/ vomitting/ diarrhea (doctor is aware) Diabetes: Yes CBG done?: No Did pt. bring in CBG monitor from home?: No  How often do you need to have someone help you when you read instructions, pamphlets, or other written materials from your doctor or pharmacy?: 1 - Never  Interpreter Needed?: No  Information entered by :: R. Charlotte Brafford LPN   Activities of Daily Living     10/29/2022    2:16 PM 09/07/2022    9:16 AM  In your present state of health, do you have any difficulty performing the following activities:  Hearing? 0   Vision? 1   Comment dry eyes   Difficulty concentrating or making decisions? 0   Walking or climbing stairs? 1   Dressing or bathing? 0   Doing errands, shopping? 0 0  Preparing Food and eating ? N   Using the Toilet? N   In the past six months, have you accidently leaked urine? N   Do you have problems with loss of bowel control? N   Managing your Medications? N   Managing your Finances? N   Housekeeping or managing your Housekeeping? N     Patient Care Team: Dana Allan, MD as PCP - General (Family Medicine) Dorothy Puffer, MD as Consulting Physician (Radiation Oncology) Malachy Mood, MD as Consulting Physician (Hematology) Manus Rudd, MD as Consulting Physician (General Surgery) Pershing Proud, RN as Oncology Nurse Navigator Donnelly Angelica, RN as Oncology Nurse Navigator Pollyann Samples, NP as Nurse Practitioner (Nurse Practitioner)  Indicate any recent Medical Services you may have received from other than [facility] providers in the past year (date may be approximate).     Assessment:   This is a routine wellness examination for Katalina.  Hearing/Vision screen Hearing Screening - Comments:: No issues Vision Screening - Comments:: Readers and glasses at times   Goals Addressed             This Visit's Progress    Patient Stated       Patient Stated       Wants to get off of Metformin and lose some weight       Depression Screen    11/02/2022   10:31 AM 02/18/2022    9:51 AM 10/29/2021   11:00 AM 10/26/2021    9:38 AM 07/22/2021   10:18 AM 10/10/2020    2:55 PM 10/10/2019    1:41 PM  PHQ 2/9 Scores  PHQ - 2 Score 0 0 0 0 0 0 0  PHQ- 9 Score 4 1       Exception Documentation Patient refusal          Fall Risk    10/29/2022    2:16 PM 02/18/2022    9:51 AM 10/29/2021   10:59 AM 10/26/2021    9:38 AM  07/22/2021   10:18 AM  Fall Risk   Falls in the past year? 0 0 0 0 0  Number falls in past yr: 0 0 0 0 0  Injury with Fall? 0 0 0  0  Risk for fall due to : No Fall Risks No Fall Risks No Fall Risks No Fall Risks No Fall Risks  Follow up Falls prevention discussed;Falls evaluation completed Falls evaluation  completed Falls evaluation completed Falls evaluation completed Falls evaluation completed    MEDICARE RISK AT HOME: Medicare Risk at Home Any stairs in or around the home?: Yes If so, are there any without handrails?: No Home free of loose throw rugs in walkways, pet beds, electrical cords, etc?: No Adequate lighting in your home to reduce risk of falls?: Yes Life alert?: No Use of a cane, walker or w/c?: No Grab bars in the bathroom?: No Shower chair or bench in shower?: No Elevated toilet seat or a handicapped toilet?: No  TIMED UP AND GO:  Was the test performed?  Yes  Length of time to ambulate 10 feet: 8 sec Gait steady and fast without use of assistive device    Cognitive Function:        11/02/2022   10:39 AM 10/26/2021    9:43 AM  6CIT Screen  What Year? 0 points 0 points  What month? 0 points 0 points  What time? 0 points 0 points  Count back from 20 0 points 0 points  Months in reverse 0 points 0 points  Repeat phrase 0 points 0 points  Total Score 0 points 0 points    Immunizations Immunization History  Administered Date(s) Administered   Fluad Quad(high Dose 65+) 03/11/2020, 10/29/2021   H1N1 02/27/2008   Influenza-Unspecified 11/18/2017   PFIZER(Purple Top)SARS-COV-2 Vaccination 03/02/2019, 03/29/2019, 01/01/2020   PNEUMOCOCCAL CONJUGATE-20 07/22/2021   Pfizer Covid-19 Vaccine Bivalent Booster 9yrs & up 02/02/2021   Pneumococcal Conjugate-13 10/03/2019   Td 01/19/2004   Tdap 12/17/2014    TDAP status: Up to date  Flu Vaccine status: Due, Education has been provided regarding the importance of this vaccine. Advised may receive this vaccine at  local pharmacy or Health Dept. Aware to provide a copy of the vaccination record if obtained from local pharmacy or Health Dept. Verbalized acceptance and understanding.  Pneumococcal vaccine status: Up to date  Covid-19 vaccine status: Information provided on how to obtain vaccines.   Qualifies for Shingles Vaccine? Yes   Zostavax completed No   Shingrix Completed?: No.    Education has been provided regarding the importance of this vaccine. Patient has been advised to call insurance company to determine out of pocket expense if they have not yet received this vaccine. Advised may also receive vaccine at local pharmacy or Health Dept. Verbalized acceptance and understanding.  Screening Tests Health Maintenance  Topic Date Due   Zoster Vaccines- Shingrix (1 of 2) Never done   FOOT EXAM  12/25/2021   Diabetic kidney evaluation - Urine ACR  07/23/2022   INFLUENZA VACCINE  08/19/2022   COVID-19 Vaccine (5 - 2023-24 season) 09/19/2022   Medicare Annual Wellness (AWV)  10/27/2022   OPHTHALMOLOGY EXAM  02/16/2023   HEMOGLOBIN A1C  03/10/2023   Diabetic kidney evaluation - eGFR measurement  09/07/2023   MAMMOGRAM  08/04/2024   DTaP/Tdap/Td (3 - Td or Tdap) 12/16/2024   Colonoscopy  08/13/2029   Pneumonia Vaccine 17+ Years old  Completed   DEXA SCAN  Completed   Hepatitis C Screening  Completed   HPV VACCINES  Aged Out    Health Maintenance  Health Maintenance Due  Topic Date Due   Zoster Vaccines- Shingrix (1 of 2) Never done   FOOT EXAM  12/25/2021   Diabetic kidney evaluation - Urine ACR  07/23/2022   INFLUENZA VACCINE  08/19/2022   COVID-19 Vaccine (5 - 2023-24 season) 09/19/2022   Medicare Annual Wellness (AWV)  10/27/2022  Colorectal cancer screening: Type of screening: Colonoscopy. Completed 07/2019. Repeat every 10 years  Mammogram status: Completed 07/2022. Repeat every year  Bone Density status: Completed 03/2021. Results reflect: Bone density results: OSTEOPENIA.  Repeat every 2 years.  Lung Cancer Screening: (Low Dose CT Chest recommended if Age 51-80 years, 20 pack-year currently smoking OR have quit w/in 15years.) does not qualify.     Additional Screening:  Hepatitis C Screening: does qualify; Completed 03/2019  Vision Screening: Recommended annual ophthalmology exams for early detection of glaucoma and other disorders of the eye. Is the patient up to date with their annual eye exam?  Yes  Who is the provider or what is the name of the office in which the patient attends annual eye exams?  Eye If pt is not established with a provider, would they like to be referred to a provider to establish care? No .   Dental Screening: Recommended annual dental exams for proper oral hygiene  Diabetic Foot Exam: Diabetic Foot Exam: Overdue, Pt has been advised about the importance in completing this exam. Pt is scheduled for diabetic foot exam on Will need at next visit and documented.  Community Resource Referral / Chronic Care Management: CRR required this visit?  No   CCM required this visit?  No     Plan:     I have personally reviewed and noted the following in the patient's chart:   Medical and social history Use of alcohol, tobacco or illicit drugs  Current medications and supplements including opioid prescriptions. Patient is currently taking opioid prescriptions. Information provided to patient regarding non-opioid alternatives. Patient advised to discuss non-opioid treatment plan with their provider. Functional ability and status Nutritional status Physical activity Advanced directives List of other physicians Hospitalizations, surgeries, and ER visits in previous 12 months Vitals Screenings to include cognitive, depression, and falls Referrals and appointments  In addition, I have reviewed and discussed with patient certain preventive protocols, quality metrics, and best practice recommendations. A written personalized care  plan for preventive services as well as general preventive health recommendations were provided to patient.     Sydell Axon, LPN   16/10/[id number]   After Visit Summary: (MyChart) Due to this being a telephonic visit, the after visit summary with patients personalized plan was offered to patient via MyChart   Nurse Notes: see routing comments

## 2022-11-03 ENCOUNTER — Other Ambulatory Visit: Payer: Self-pay | Admitting: Family Medicine

## 2022-11-03 ENCOUNTER — Other Ambulatory Visit (INDEPENDENT_AMBULATORY_CARE_PROVIDER_SITE_OTHER): Payer: Medicare HMO

## 2022-11-03 ENCOUNTER — Encounter: Payer: Self-pay | Admitting: Family Medicine

## 2022-11-03 DIAGNOSIS — E782 Mixed hyperlipidemia: Secondary | ICD-10-CM | POA: Diagnosis not present

## 2022-11-03 DIAGNOSIS — I152 Hypertension secondary to endocrine disorders: Secondary | ICD-10-CM

## 2022-11-03 DIAGNOSIS — E1159 Type 2 diabetes mellitus with other circulatory complications: Secondary | ICD-10-CM

## 2022-11-03 DIAGNOSIS — C50911 Malignant neoplasm of unspecified site of right female breast: Secondary | ICD-10-CM | POA: Insufficient documentation

## 2022-11-03 LAB — MICROALBUMIN / CREATININE URINE RATIO
Creatinine,U: 104 mg/dL
Microalb Creat Ratio: 1 mg/g (ref 0.0–30.0)
Microalb, Ur: 1 mg/dL (ref 0.0–1.9)

## 2022-11-03 LAB — LIPID PANEL
Cholesterol: 168 mg/dL (ref 0–200)
HDL: 42.2 mg/dL (ref >39.00)
LDL Cholesterol: 85 mg/dL (ref 0–99)
NonHDL: 125.46
Total CHOL/HDL Ratio: 4
Triglycerides: 200 mg/dL — ABNORMAL HIGH (ref 0.0–149.0)
VLDL: 40 mg/dL (ref 0.0–40.0)

## 2022-11-08 ENCOUNTER — Other Ambulatory Visit: Payer: Self-pay | Admitting: *Deleted

## 2022-11-08 ENCOUNTER — Telehealth: Payer: Self-pay | Admitting: *Deleted

## 2022-11-08 ENCOUNTER — Encounter: Payer: Self-pay | Admitting: Family Medicine

## 2022-11-08 ENCOUNTER — Other Ambulatory Visit: Payer: Self-pay | Admitting: Hematology

## 2022-11-08 ENCOUNTER — Telehealth: Payer: Self-pay

## 2022-11-08 ENCOUNTER — Ambulatory Visit: Payer: Medicare HMO | Admitting: Family Medicine

## 2022-11-08 VITALS — BP 118/78 | HR 78 | Temp 97.9°F | Resp 16 | Ht 68.0 in | Wt 200.1 lb

## 2022-11-08 DIAGNOSIS — K591 Functional diarrhea: Secondary | ICD-10-CM

## 2022-11-08 DIAGNOSIS — D0511 Intraductal carcinoma in situ of right breast: Secondary | ICD-10-CM | POA: Diagnosis not present

## 2022-11-08 DIAGNOSIS — K219 Gastro-esophageal reflux disease without esophagitis: Secondary | ICD-10-CM | POA: Diagnosis not present

## 2022-11-08 DIAGNOSIS — N6489 Other specified disorders of breast: Secondary | ICD-10-CM

## 2022-11-08 DIAGNOSIS — Z7984 Long term (current) use of oral hypoglycemic drugs: Secondary | ICD-10-CM

## 2022-11-08 DIAGNOSIS — M069 Rheumatoid arthritis, unspecified: Secondary | ICD-10-CM

## 2022-11-08 DIAGNOSIS — Z23 Encounter for immunization: Secondary | ICD-10-CM

## 2022-11-08 DIAGNOSIS — G43709 Chronic migraine without aura, not intractable, without status migrainosus: Secondary | ICD-10-CM

## 2022-11-08 DIAGNOSIS — Z79899 Other long term (current) drug therapy: Secondary | ICD-10-CM

## 2022-11-08 DIAGNOSIS — E118 Type 2 diabetes mellitus with unspecified complications: Secondary | ICD-10-CM

## 2022-11-08 NOTE — Progress Notes (Unsigned)
SUBJECTIVE:   Chief Complaint  Patient presents with   Medical Management of Chronic Issues   HPI Presents for chronic disease management follow up   Discussed the use of AI scribe software for clinical note transcription with the patient, who gave verbal consent to proceed.  History of Present Illness The patient, a 67 year old with a history of rheumatoid arthritis, diabetes, and breast cancer, presents for follow-up after a recent right mastectomy performed on August 27. The patient reports a postoperative complication of seroma formation, requiring weekly drainage initially, but now extending to every two weeks. The patient also experienced an allergic reaction to the drain tube, resulting in skin blistering.  The patient's breast cancer history is complex, with an initial lumpectomy and radiation therapy four years ago, followed by a recent mastectomy due to the discovery of a larger, aggressive lump in the same breast. Despite radiation and ongoing tamoxifen therapy, the recurrence has raised concerns among the patient's care team.  The patient also reports a change in bowel habits over the past year, with an increase in frequency and a shift from constipation to loose stools. The patient suspects this may be related to long-term metformin use for diabetes management. The patient's diabetes is reportedly well-controlled, with the patient admitting to a diet that includes carbohydrates but avoids sweets. The patient does not regularly monitor blood glucose levels at home, citing a lack of symptoms of hyperglycemia.  The patient's other chronic conditions include rheumatoid arthritis, managed with Arava, and hypothyroidism, managed with Synthroid. The patient also takes Nexium and Pepcid for gastroesophageal reflux disease (GERD), Zanaflex for muscle spasticity, and Topamax for migraine prevention. The patient has noticed weight gain but is unsure of the cause. Despite these health issues,  the patient describes an active lifestyle, including deer hunting.    PERTINENT PMH / PSH: As above  OBJECTIVE:  BP 118/78   Pulse 78   Temp 97.9 F (36.6 C)   Resp 16   Ht 5\' 8"  (1.727 m)   Wt 200 lb 2 oz (90.8 kg)   SpO2 98%   BMI 30.43 kg/m    Physical Exam Vitals reviewed.  Constitutional:      General: She is not in acute distress.    Appearance: Normal appearance. She is obese. She is not ill-appearing, toxic-appearing or diaphoretic.  Eyes:     General:        Right eye: No discharge.        Left eye: No discharge.     Conjunctiva/sclera: Conjunctivae normal.  Cardiovascular:     Rate and Rhythm: Normal rate and regular rhythm.     Heart sounds: Normal heart sounds.  Pulmonary:     Effort: Pulmonary effort is normal.     Breath sounds: Normal breath sounds.  Abdominal:     General: Bowel sounds are normal. There is no distension.     Palpations: Abdomen is soft.     Tenderness: There is no abdominal tenderness.  Musculoskeletal:        General: Normal range of motion.  Skin:    General: Skin is warm and dry.  Neurological:     General: No focal deficit present.     Mental Status: She is alert and oriented to person, place, and time. Mental status is at baseline.  Psychiatric:        Mood and Affect: Mood normal.        Behavior: Behavior normal.  Thought Content: Thought content normal.        Judgment: Judgment normal.        11/02/2022   10:31 AM 02/18/2022    9:51 AM 10/29/2021   11:00 AM 10/26/2021    9:38 AM 07/22/2021   10:18 AM  Depression screen PHQ 2/9  Decreased Interest 0 0 0 0 0  Down, Depressed, Hopeless 0 0 0 0 0  PHQ - 2 Score 0 0 0 0 0  Altered sleeping 0 0     Tired, decreased energy 1 1     Change in appetite 3 0     Feeling bad or failure about yourself  0 0     Trouble concentrating 0 0     Moving slowly or fidgety/restless 0 0     Suicidal thoughts 0 0     PHQ-9 Score 4 1     Difficult doing work/chores Not difficult  at all Not difficult at all         02/18/2022    9:51 AM 08/28/2019    1:34 PM 08/16/2019    1:10 PM 04/27/2019    3:08 PM  GAD 7 : Generalized Anxiety Score  Nervous, Anxious, on Edge 0 0 0 0  Control/stop worrying 0 0 0 0  Worry too much - different things 0 0 0 0  Trouble relaxing 0 0 0 0  Restless 0 0 0 0  Easily annoyed or irritable 0 0 0 0  Afraid - awful might happen 0 0 0 0  Total GAD 7 Score 0 0 0 0  Anxiety Difficulty Not difficult at all Not difficult at all Not difficult at all Not difficult at all    ASSESSMENT/PLAN:  Type 2 diabetes with complication [facility]) Assessment & Plan: Reports loose stools, possibly related to Metformin use. A1C and home glucose monitoring not currently performed. -Hold Metformin for a few days to assess for resolution of loose stools. -Check A1C.  Orders: -     Vitamin B12; Future  Need for influenza vaccination -     Flu Vaccine Trivalent High Dose (Fluad)  Functional diarrhea Assessment & Plan: Chronic issue. Hemodynamically stable.  No abdominal pain. Possible side effects of medications No recent antibiotic use Check labs today Check stool studies Last colonoscopy showed tubular adenoma, recommended repeat 5 yrs. Due 2027  Orders: -     Fecal lactoferrin, quant -     Stool culture -     Cdiff NAA+O+P+Stool Culture; Future -     CBC with Differential/Platelet; Future -     Comprehensive metabolic panel; Future -     TSH; Future -     Tissue Transglutaminase Abs,IgG,IgA; Future  Seroma of breast Assessment & Plan: Seroma formation post right mastectomy, requiring regular drainage. No drain currently in place due to skin blistering from previous drain. -Continue with regular drainage as needed. -Follow up with Oncology as scheduled   Ductal carcinoma in situ (DCIS) of right breast Assessment & Plan: History of lumpectomy and radiation therapy, followed by right mastectomy due to recurrence. Lymph nodes clear. Currently on  Tamoxifen. -Continue Tamoxifen as prescribed.    Rheumatoid arthritis involving multiple sites, unspecified whether rheumatoid factor present [facility]) Assessment & Plan: Managed with Arava. -Continue Arava as prescribed. -Possible side effect of Arave, diarrhea, can discuss with Rheumatology   Gastroesophageal reflux disease without esophagitis Assessment & Plan: Managed with Nexium and Pepcid. -Continue Nexium and Pepcid as prescribed.   Chronic migraine without  aura without status migrainosus, not intractable Assessment & Plan: Managed with Topamax and Zanaflex, with breakthrough migraines treated with Excedrin Migraine. -Continue current migraine management regimen.     PDMP reviewed  Return in about 2 weeks (around 11/22/2022), or if symptoms worsen or fail to improve, for PCP.  Dana Allan, MD

## 2022-11-08 NOTE — Patient Instructions (Addendum)
It was a pleasure meeting you today. Thank you for allowing me to take part in your health care.  Our goals for today as we discussed include:  Colonoscopy due 2026 with Dr Norma Fredrickson  Check labs today Will get stool samples  Stop Metformin for few days to see if any improvement If diarrhea improves will need to start different diabetic medication Monitor blood glucose at home daily  Flu vaccine today   This is a list of the screening recommended for you and due dates:  Health Maintenance  Topic Date Due   Zoster (Shingles) Vaccine (1 of 2) Never done   COVID-19 Vaccine (5 - 2023-24 season) 09/19/2022   Eye exam for diabetics  02/16/2023   Hemoglobin A1C  03/10/2023   Yearly kidney function blood test for diabetes  09/07/2023   Medicare Annual Wellness Visit  11/02/2023   Yearly kidney health urinalysis for diabetes  11/03/2023   Complete foot exam   11/08/2023   Mammogram  08/04/2024   DTaP/Tdap/Td vaccine (3 - Td or Tdap) 12/16/2024   Colon Cancer Screening  08/13/2029   Pneumonia Vaccine  Completed   Flu Shot  Completed   DEXA scan (bone density measurement)  Completed   Hepatitis C Screening  Completed   HPV Vaccine  Aged Out    Follow up in 2 weeks  If you have any questions or concerns, please do not hesitate to call the office at [phone].  I look forward to our next visit and until then take care and stay safe.  Regards,   Dana Allan, MD   [facility]

## 2022-11-08 NOTE — Telephone Encounter (Signed)
Patient states she spoke with Dr. Titus Dubin, her rheumatologist, and she has given lab orders for patient to have a CBC and CMP.  Patient states she is hard to stick, so she would like to combine these labs with the ones Dr. Dana Allan is ordering, so she will only have to be stuck once.  Patient states she would prefer to have her labs drawn at [facility] in [place], since she lives in [place], but if we need her to come to our office, then she will be glad to have them done with [place] as long as she will only be stuck once.

## 2022-11-08 NOTE — Telephone Encounter (Signed)
Patient contacted the office and left message stating she has an appointment in November and needs to have labs done.   Returned call to patient and she states she had a mastectomy in August. Patient states she was given clearance by the surgeon a couple of weeks after surgery to restart the [place]. Lab orders released for patient to have labs done at [facility].

## 2022-11-09 ENCOUNTER — Encounter: Payer: Self-pay | Admitting: Family Medicine

## 2022-11-09 DIAGNOSIS — N6489 Other specified disorders of breast: Secondary | ICD-10-CM | POA: Insufficient documentation

## 2022-11-09 DIAGNOSIS — E118 Type 2 diabetes mellitus with unspecified complications: Secondary | ICD-10-CM | POA: Insufficient documentation

## 2022-11-09 DIAGNOSIS — R197 Diarrhea, unspecified: Secondary | ICD-10-CM | POA: Insufficient documentation

## 2022-11-09 DIAGNOSIS — Z23 Encounter for immunization: Secondary | ICD-10-CM | POA: Insufficient documentation

## 2022-11-09 NOTE — Assessment & Plan Note (Deleted)
Reports loose stools, possibly related to Metformin use. A1C and home glucose monitoring not currently performed. -Hold Metformin for a few days to assess for resolution of loose stools. -Check A1C.

## 2022-11-09 NOTE — Assessment & Plan Note (Signed)
Managed with Nexium and Pepcid. -Continue Nexium and Pepcid as prescribed.

## 2022-11-09 NOTE — Assessment & Plan Note (Signed)
Managed with Arava. -Continue Arava as prescribed. -Possible side effect of Arave, diarrhea, can discuss with Rheumatology

## 2022-11-09 NOTE — Assessment & Plan Note (Addendum)
History of lumpectomy and radiation therapy, followed by right mastectomy due to recurrence. Lymph nodes clear. Currently on Tamoxifen. -Continue Tamoxifen as prescribed.

## 2022-11-09 NOTE — Assessment & Plan Note (Signed)
Reports loose stools, possibly related to Metformin use. A1C and home glucose monitoring not currently performed. -Hold Metformin for a few days to assess for resolution of loose stools. -Check A1C.

## 2022-11-09 NOTE — Assessment & Plan Note (Signed)
Seroma formation post right mastectomy, requiring regular drainage. No drain currently in place due to skin blistering from previous drain. -Continue with regular drainage as needed. -Follow up with Oncology as scheduled

## 2022-11-09 NOTE — Assessment & Plan Note (Addendum)
Chronic issue. Hemodynamically stable.  No abdominal pain. Possible side effects of medications No recent antibiotic use Check labs today Check stool studies Last colonoscopy showed tubular adenoma, recommended repeat 5 yrs. Due 2027

## 2022-11-09 NOTE — Assessment & Plan Note (Signed)
Managed with Topamax and Zanaflex, with breakthrough migraines treated with Excedrin Migraine. -Continue current migraine management regimen.

## 2022-11-11 NOTE — Plan of Care (Signed)
[facility] Tonsillectomy/Adenoidectomy, Postoperative PEDS care plan entered in error.

## 2022-11-15 ENCOUNTER — Telehealth: Payer: Self-pay | Admitting: Family Medicine

## 2022-11-15 ENCOUNTER — Other Ambulatory Visit: Payer: Self-pay | Admitting: Family Medicine

## 2022-11-15 NOTE — Telephone Encounter (Signed)
Patient just called and said she needs a refill on her medication. The name is tiZANidine (ZANAFLEX) 4 MG tablet. The pharmacy she use is [facility] [phone] - [place], [place] - [address] AT [facility] OF [address], [place] [address] Phone: [phone]  Fax: [phone]  Her number is [phone]

## 2022-11-15 NOTE — Telephone Encounter (Signed)
RX request sent to provider.

## 2022-11-18 ENCOUNTER — Other Ambulatory Visit: Payer: Medicare HMO

## 2022-11-18 DIAGNOSIS — K591 Functional diarrhea: Secondary | ICD-10-CM

## 2022-11-18 DIAGNOSIS — E118 Type 2 diabetes mellitus with unspecified complications: Secondary | ICD-10-CM | POA: Diagnosis not present

## 2022-11-18 LAB — CBC WITH DIFFERENTIAL/PLATELET
Basophils Absolute: 0 10*3/uL (ref 0.0–0.1)
Basophils Relative: 1 % (ref 0.0–3.0)
Eosinophils Absolute: 0.3 10*3/uL (ref 0.0–0.7)
Eosinophils Relative: 5.4 % — ABNORMAL HIGH (ref 0.0–5.0)
HCT: 41.2 % (ref 36.0–46.0)
Hemoglobin: 13.3 g/dL (ref 12.0–15.0)
Lymphocytes Relative: 46.6 % — ABNORMAL HIGH (ref 12.0–46.0)
Lymphs Abs: 2.2 10*3/uL (ref 0.7–4.0)
MCHC: 32.4 g/dL (ref 30.0–36.0)
MCV: 93.3 fL (ref 78.0–100.0)
Monocytes Absolute: 0.6 10*3/uL (ref 0.1–1.0)
Monocytes Relative: 12.3 % — ABNORMAL HIGH (ref 3.0–12.0)
Neutro Abs: 1.6 10*3/uL (ref 1.4–7.7)
Neutrophils Relative %: 34.7 % — ABNORMAL LOW (ref 43.0–77.0)
Platelets: 275 10*3/uL (ref 150.0–400.0)
RBC: 4.41 Mil/uL (ref 3.87–5.11)
RDW: 13.8 % (ref 11.5–15.5)
WBC: 4.7 10*3/uL (ref 4.0–10.5)

## 2022-11-18 LAB — COMPREHENSIVE METABOLIC PANEL
ALT: 13 U/L (ref 0–35)
AST: 20 U/L (ref 0–37)
Albumin: 4.1 g/dL (ref 3.5–5.2)
Alkaline Phosphatase: 43 U/L (ref 39–117)
BUN: 13 mg/dL (ref 6–23)
CO2: 26 meq/L (ref 19–32)
Calcium: 9.6 mg/dL (ref 8.4–10.5)
Chloride: 106 meq/L (ref 96–112)
Creatinine, Ser: 0.64 mg/dL (ref 0.40–1.20)
GFR: 90.31 mL/min (ref >60.00)
Glucose, Bld: 134 mg/dL — ABNORMAL HIGH (ref 70–99)
Potassium: 4.1 meq/L (ref 3.5–5.1)
Sodium: 141 meq/L (ref 135–145)
Total Bilirubin: 0.4 mg/dL (ref 0.2–1.2)
Total Protein: 6.8 g/dL (ref 6.0–8.3)

## 2022-11-18 LAB — TSH: TSH: 0.95 u[IU]/mL (ref 0.35–5.50)

## 2022-11-18 LAB — VITAMIN B12: Vitamin B-12: 404 pg/mL (ref 211–911)

## 2022-11-19 ENCOUNTER — Other Ambulatory Visit: Payer: Self-pay | Admitting: Physician Assistant

## 2022-11-19 DIAGNOSIS — M0579 Rheumatoid arthritis with rheumatoid factor of multiple sites without organ or systems involvement: Secondary | ICD-10-CM

## 2022-11-19 NOTE — Telephone Encounter (Signed)
Last Fill: 06/24/2022  Labs: 11/18/2022 neutrophils relative % 34.7, lymphocytes relative 46.6, monocytes relative 12.3, eosinophils relative 5.4, glucose 134  Next Visit: 12/13/2022  Last Visit: 07/12/2022  DX: Rheumatoid arthritis involving multiple sites with positive rheumatoid factor   Current Dose per office note on 07/12/2022: Arava 20 mg 1 tablet by mouth daily.   Okay to refill Arava ?

## 2022-11-21 ENCOUNTER — Encounter: Payer: Self-pay | Admitting: Family Medicine

## 2022-11-22 ENCOUNTER — Encounter: Payer: Self-pay | Admitting: Family Medicine

## 2022-11-22 ENCOUNTER — Ambulatory Visit (INDEPENDENT_AMBULATORY_CARE_PROVIDER_SITE_OTHER): Payer: Medicare HMO | Admitting: Family Medicine

## 2022-11-22 VITALS — BP 132/78 | HR 77 | Temp 98.2°F | Resp 16 | Ht 68.0 in | Wt 199.4 lb

## 2022-11-22 DIAGNOSIS — R197 Diarrhea, unspecified: Secondary | ICD-10-CM | POA: Diagnosis not present

## 2022-11-22 DIAGNOSIS — Z7984 Long term (current) use of oral hypoglycemic drugs: Secondary | ICD-10-CM

## 2022-11-22 DIAGNOSIS — E118 Type 2 diabetes mellitus with unspecified complications: Secondary | ICD-10-CM | POA: Diagnosis not present

## 2022-11-22 MED ORDER — EMPAGLIFLOZIN 10 MG PO TABS
10.0000 mg | ORAL_TABLET | Freq: Every day | ORAL | 3 refills | Status: DC
Start: 2022-11-22 — End: 2023-03-28

## 2022-11-22 MED ORDER — EMPAGLIFLOZIN 10 MG PO TABS: 10.0000 mg | ORAL_TABLET | Freq: Every day | ORAL | Status: DC

## 2022-11-22 NOTE — Patient Instructions (Addendum)
It was a pleasure meeting you today. Thank you for allowing me to take part in your health care.  Our goals for today as we discussed include:  Stop Metformin Start Jardiance 10 mg daily Check blood glucose daily.  If >400 go to ED  Schedule lab appointment in 1-2 weeks to check kidney function  Recent labs showing increase in triglycerides.  Recommend avoiding processed foods and high carbohydrate intake.  Increase activity as tolerated.  If diarrhea continues will refer to GI  Follow up in 4 weeks  If you have any questions or concerns, please do not hesitate to call the office at [phone].  I look forward to our next visit and until then take care and stay safe.  Regards,   Dana Allan, MD   [facility]

## 2022-11-22 NOTE — Progress Notes (Signed)
SUBJECTIVE:   Chief Complaint  Patient presents with   Diarrhea   HPI Presents to clinic to follow up diarrhea  Discussed the use of AI scribe software for clinical note transcription with the patient, who gave verbal consent to proceed.  History of Present Illness The patient, with a history of diabetes, presents with persistent diarrhea. She reports that the diarrhea had improved slightly during a brief period of medication non-compliance due to a busy schedule, but has since returned upon resuming her metformin. The patient has been on metformin for a long time without previous issues, making the recent onset of diarrhea puzzling. She does indicate that she has noticed that when consuming increased carbohydrates there tends to be more loose stool and that since hospitalization has been more consistent.  The patient describes the diarrhea as starting out soft, then becoming runny after a couple of bowel movements. She typically has bowel movements three times a day. Accompanying the diarrhea, the patient experiences abdominal pain, particularly in the evening after her meal. She reports no nausea, vomiting, blood in stool, or significant weight loss.  She has not noticed any correlation between her symptoms and her diet, although she suspects that consuming sugary foods may exacerbate the diarrhea.  The patient's last colonoscopy was approximately 3 years ago and was positive for tubular adenoma. Recommended a 5 year follow up, Due 07/2024.  Follows with Dr Norma Fredrickson. She has tried stool softeners, thinking maybe needing to have clean out but reports did not have any change in symptoms.    PERTINENT PMH / PSH: As above  OBJECTIVE:  BP 132/78   Pulse 77   Temp 98.2 F (36.8 C)   Resp 16   Ht 5\' 8"  (1.727 m)   Wt 199 lb 6 oz (90.4 kg)   SpO2 96%   BMI 30.31 kg/m    Physical Exam Vitals reviewed.  Constitutional:      General: She is not in acute distress.    Appearance:  Normal appearance. She is normal weight. She is not ill-appearing, toxic-appearing or diaphoretic.  HENT:     Mouth/Throat:     Mouth: Mucous membranes are moist.  Eyes:     General:        Right eye: No discharge.        Left eye: No discharge.     Conjunctiva/sclera: Conjunctivae normal.  Cardiovascular:     Rate and Rhythm: Normal rate.  Pulmonary:     Effort: Pulmonary effort is normal.  Abdominal:     General: Bowel sounds are normal. There is no distension.     Palpations: Abdomen is soft. There is no mass.     Tenderness: There is no abdominal tenderness. There is no guarding or rebound.  Skin:    General: Skin is warm and dry.  Neurological:     General: No focal deficit present.     Mental Status: She is alert and oriented to person, place, and time. Mental status is at baseline.  Psychiatric:        Mood and Affect: Mood normal.        Behavior: Behavior normal.        Thought Content: Thought content normal.        Judgment: Judgment normal.        11/22/2022    8:07 AM 11/02/2022   10:31 AM 02/18/2022    9:51 AM 10/29/2021   11:00 AM 10/26/2021    9:38 AM  Depression  screen PHQ 2/9  Decreased Interest 0 0 0 0 0  Down, Depressed, Hopeless 0 0 0 0 0  PHQ - 2 Score 0 0 0 0 0  Altered sleeping 0 0 0    Tired, decreased energy 1 1 1     Change in appetite 3 3 0    Feeling bad or failure about yourself  0 0 0    Trouble concentrating 0 0 0    Moving slowly or fidgety/restless 0 0 0    Suicidal thoughts 0 0 0    PHQ-9 Score 4 4 1     Difficult doing work/chores Not difficult at all Not difficult at all Not difficult at all        11/22/2022    8:08 AM 02/18/2022    9:51 AM 08/28/2019    1:34 PM 08/16/2019    1:10 PM  GAD 7 : Generalized Anxiety Score  Nervous, Anxious, on Edge 0 0 0 0  Control/stop worrying 0 0 0 0  Worry too much - different things 0 0 0 0  Trouble relaxing 0 0 0 0  Restless 0 0 0 0  Easily annoyed or irritable 0 0 0 0  Afraid - awful might  happen 0 0 0 0  Total GAD 7 Score 0 0 0 0  Anxiety Difficulty Not difficult at all Not difficult at all Not difficult at all Not difficult at all    ASSESSMENT/PLAN:  Type 2 diabetes with complication [facility]) Assessment & Plan: Controlled on Metformin, however, recent onset of diarrhea likely secondary to Metformin. No other significant findings on labs. Patient has been on Metformin for a long period without previous issues. -Discontinue Metformin and initiate Januvia  -Check kidney function in 1-2 weeks after starting new medication. -Advise patient to monitor blood glucose levels at home periodically.  Orders: -     Empagliflozin; Take 1 tablet (10 mg total) by mouth daily before breakfast.  Dispense: 30 tablet; Refill: 3 -     Empagliflozin; Take 1 tablet (10 mg total) by mouth daily before breakfast. -     Comprehensive metabolic panel; Future  Diarrhea, unspecified type Assessment & Plan: Likely secondary to Metformin. Abdominal pain noted, particularly in the evening after meals. No other significant symptoms. Recent labs unremarkable. Stool sample pending. History of antibiotic use <3 months. -Discontinue Metformin and monitor for improvement in diarrhea. -If no improvement, consider referral to Gastroenterology.     PDMP reviewed  Return in about 4 weeks (around 12/20/2022) for PCP.  Dana Allan, MD

## 2022-11-22 NOTE — Assessment & Plan Note (Signed)
Controlled on Metformin, however, recent onset of diarrhea likely secondary to Metformin. No other significant findings on labs. Patient has been on Metformin for a long period without previous issues. -Discontinue Metformin and initiate Januvia  -Check kidney function in 1-2 weeks after starting new medication. -Advise patient to monitor blood glucose levels at home periodically.

## 2022-11-22 NOTE — Assessment & Plan Note (Addendum)
Likely secondary to Metformin. Abdominal pain noted, particularly in the evening after meals. No other significant symptoms. Recent labs unremarkable. Stool sample pending. History of antibiotic use <3 months. -Discontinue Metformin and monitor for improvement in diarrhea. -If no improvement, consider referral to Gastroenterology.

## 2022-11-22 NOTE — Progress Notes (Signed)
Medication Samples have been provided to the patient.  Drug name: Jardiance                  Strength: 10mg      Qty: 4 boxes    LOT: [id number]     Exp.Date: 01/19/2024  Dosing instructions: Take 1 tablet (10 mg total) by mouth daily before breakfast   The patient has been instructed regarding the correct time, dose, and frequency of taking this medication, including desired effects and most common side effects.   Tresa Endo  Cyndie Woodbeck 8:28 AM 11/22/2022

## 2022-11-23 ENCOUNTER — Telehealth: Payer: Self-pay | Admitting: Family Medicine

## 2022-11-23 MED ORDER — LANCETS MISC. MISC
1.0000 | 0 refills | Status: AC
Start: 2022-11-24 — End: ?

## 2022-11-23 MED ORDER — BLOOD GLUCOSE MONITORING SUPPL DEVI: 1.0000 | 0 refills | Status: AC

## 2022-11-23 MED ORDER — LANCET DEVICE MISC: 1.0000 | 0 refills | Status: AC

## 2022-11-23 MED ORDER — BLOOD GLUCOSE TEST VI STRP: 1.0000 | ORAL_STRIP | 0 refills | Status: AC

## 2022-11-23 NOTE — Addendum Note (Signed)
Addended by: Vertis Kelch on: 11/23/2022 04:02 PM   Modules accepted: Orders

## 2022-11-23 NOTE — Telephone Encounter (Signed)
Rx request sent to pharmacy. Pt notified.

## 2022-11-23 NOTE — Telephone Encounter (Signed)
Patient just called and asked can she get some strips ordered. She said her husband has the meter a 1 touch meter. He is just almost out of stripes. The pharmacy she use is [facility] [phone] - [place], [place] - [address] AT [facility] [address], [place] [address] Phone: [phone]  Fax: [phone]  Her number is [phone]

## 2022-11-29 NOTE — Progress Notes (Deleted)
Office Visit Note  Patient: Cheryl Knight             Date of Birth: [birth year]           MRN: [id number]             PCP: Dana Allan, MD Referring: Dana Allan, MD Visit Date: 12/13/2022 Occupation: @GUAROCC @  Subjective:  No chief complaint on file.   History of Present Illness: Cheryl Knight is a 67 y.o. female ***     Activities of Daily Living:  Patient reports morning stiffness for *** {minute/hour:19697}.   Patient {ACTIONS;DENIES/REPORTS:21021675::"Denies"} nocturnal pain.  Difficulty dressing/grooming: {ACTIONS;DENIES/REPORTS:21021675::"Denies"} Difficulty climbing stairs: {ACTIONS;DENIES/REPORTS:21021675::"Denies"} Difficulty getting out of chair: {ACTIONS;DENIES/REPORTS:21021675::"Denies"} Difficulty using hands for taps, buttons, cutlery, and/or writing: {ACTIONS;DENIES/REPORTS:21021675::"Denies"}  No Rheumatology ROS completed.   PMFS History:  Patient Active Problem List   Diagnosis Date Noted   Seroma of breast 11/09/2022   Diarrhea 11/09/2022   Need for influenza vaccination 11/09/2022   Type 2 diabetes with complication (HCC) 11/09/2022   Ductal carcinoma in situ (DCIS) of right breast 08/26/2022   ILD (interstitial lung disease) (HCC) 07/05/2022   Chronic back pain 11/07/2021   Fatty liver 06/24/2020   Pulmonary fibrosis (HCC) 10/01/2019   Pulmonary nodules 10/01/2019   Bronchiectasis without complication (HCC) 10/01/2019   Aortic atherosclerosis (HCC) 10/01/2019   Hypertension associated with diabetes (HCC) 10/01/2019   Paresthesia of upper extremity 10/01/2019   Hiatal hernia 08/16/2019   Dry eye syndrome 08/05/2019   Glaucoma 08/05/2019   Hypertensive retinopathy 08/05/2019   Arteriosclerosis 08/05/2019   Pterygium 08/05/2019   Ocular rosacea 08/05/2019   Malignant neoplasm of lower-outer quadrant of right breast of female, estrogen receptor positive (HCC) 03/19/2019   Allergic rhinitis 02/08/2019   Osteoarthritis    Obesity  (BMI 30-39.9) 12/30/2015   Basal cell carcinoma of chest wall 10/05/2011   Hyperlipidemia 02/27/2008   Hypothyroidism 02/21/2007   Migraine 02/21/2007   VAGINITIS, ATROPHIC 02/21/2007   Psoriasis 02/21/2007   Sicca syndrome, unspecified 02/21/2007   GERD 06/29/2006   Rheumatoid arthritis (HCC) 06/29/2006    Past Medical History:  Diagnosis Date   Cancer (HCC) 02/2019   right breast IDC   Cataracts, bilateral    tbd surgery 2021   Complication of anesthesia    Cough    Diabetes mellitus without complication (HCC)    Difficult intubation    TRACH AT 19   Dry eye syndrome    Dyspnea    W/ COUGHING- was due to lisinopril and septic gallbladder   Family history of adverse reaction to anesthesia    sister coded with propofol use   Family history of breast cancer    Family history of lung cancer    Family history of prostate cancer    Family history of skin cancer    GERD (gastroesophageal reflux disease)    Glaucoma    stable glaucoma suspect 01/19/21 [facility]   Headache    High cholesterol    History of hiatal hernia    History of pneumonia    walking pneumonia "years ago"   Hypertension    Hypothyroidism    Irritable bowel    Osteoarthritis    i.e right knee    PONV (postoperative nausea and vomiting)    Pre-diabetes    Retinopathy    htn 01/19/21 [facility]   Rheumatoid arthritis (HCC)    Squamous cell carcinoma of skin of chest    removed   Thyroid disease  Family History  Problem Relation Age of Onset   Hypertension Mother    Thyroid disease Mother    Breast cancer Mother 73       breast dx age 66 died age 40    Prostate cancer Father 35       metastatic   Stroke Maternal Grandmother    Skin cancer Maternal Uncle        dx. in his 52s   Lung cancer Maternal Uncle        smoker, died age 52   Past Surgical History:  Procedure Laterality Date   ABDOMINAL HYSTERECTOMY     age 45 ovaries out   BREAST LUMPECTOMY WITH RADIOACTIVE SEED AND SENTINEL  LYMPH NODE BIOPSY Right 03/28/2019   Procedure: RIGHT BREAST LUMPECTOMY WITH RADIOACTIVE SEED AND SENTINEL LYMPH NODE BIOPSY;  Surgeon: Manus Rudd, MD;  Location: [facility];  Service: General;  Laterality: Right;  PEC BLOCK FOR POST OP PAIN   BREAST SURGERY     CATARACT EXTRACTION, BILATERAL     CHOLECYSTECTOMY     10/2019   COLONOSCOPY     x2   ESOPHAGEAL DILATION     Dr. Juanda Chance 15 years from 2021- x2   MASTECTOMY W/ SENTINEL NODE BIOPSY Right 09/14/2022   Procedure: RIGHT MASTECTOMY WITH SENTINEL LYMPH NODE BIOPSY;  Surgeon: Manus Rudd, MD;  Location: [facility];  Service: General;  Laterality: Right;  LMA PEC BLOCK   SKIN BIOPSY     04/2022, right arm, left leg   TONSILLECTOMY     67 years old- had trach   TRACHEOSTOMY     67 years old- placed and removed within 24 hours during tonsillectomy   TUBAL LIGATION     Social History   Social History Narrative   Nurse at [employer]   Has a beach house    Immunization History  Administered Date(s) Administered   Fluad Quad(high Dose 65+) 03/11/2020, 10/29/2021   Fluad Trivalent(High Dose 65+) 11/08/2022   H1N1 02/27/2008   Influenza-Unspecified 11/18/2017   PFIZER(Purple Top)SARS-COV-2 Vaccination 03/02/2019, 03/29/2019, 01/01/2020   PNEUMOCOCCAL CONJUGATE-20 07/22/2021   Pfizer Covid-19 Vaccine Bivalent Booster 39yrs & up 02/02/2021   Pneumococcal Conjugate-13 10/03/2019   Td 01/19/2004   Tdap 12/17/2014     Objective: Vital Signs: There were no vitals taken for this visit.   Physical Exam   Musculoskeletal Exam: ***  CDAI Exam: CDAI Score: -- Patient Global: --; Provider Global: -- Swollen: --; Tender: -- Joint Exam 12/13/2022   No joint exam has been documented for this visit   There is currently no information documented on the homunculus. Go to the Rheumatology activity and complete the homunculus joint exam.  Investigation: No additional findings.  Imaging: No results found.  Recent  Labs: Lab Results  Component Value Date   WBC 4.7 11/18/2022   HGB 13.3 11/18/2022   PLT 275.0 11/18/2022   NA 141 11/18/2022   K 4.1 11/18/2022   CL 106 11/18/2022   CO2 26 11/18/2022   GLUCOSE 134 (H) 11/18/2022   BUN 13 11/18/2022   CREATININE 0.64 11/18/2022   BILITOT 0.4 11/18/2022   ALKPHOS 43 11/18/2022   AST 20 11/18/2022   ALT 13 11/18/2022   PROT 6.8 11/18/2022   ALBUMIN 4.1 11/18/2022   CALCIUM 9.6 11/18/2022   GFRAA 108 04/22/2020   QFTBGOLDPLUS NEGATIVE 04/05/2019    Speciality Comments: No specialty comments available.  Procedures:  No procedures performed Allergies: Pentazocine lactate, Tape, Demerol [meperidine],  and Lisinopril   Assessment / Plan:     Visit Diagnoses: Rheumatoid arthritis involving multiple sites with positive rheumatoid factor (HCC)  High risk medication use  ILD (interstitial lung disease) (HCC)  History of asthma  Sicca syndrome (HCC)  Primary osteoarthritis of both knees  Primary osteoarthritis of both feet  Psoriasis  History of hyperlipidemia  History of hypothyroidism  Malignant neoplasm of lower-outer quadrant of right breast of female, estrogen receptor positive (HCC)  History of migraine  History of gastroesophageal reflux (GERD)  Orders: No orders of the defined types were placed in this encounter.  No orders of the defined types were placed in this encounter.   Face-to-face time spent with patient was *** minutes. Greater than 50% of time was spent in counseling and coordination of care.  Follow-Up Instructions: No follow-ups on file.   Gearldine Bienenstock, PA-C  Note - This record has been created using Dragon software.  Chart creation errors have been sought, but may not always  have been located. Such creation errors do not reflect on  the standard of medical care.

## 2022-12-03 ENCOUNTER — Ambulatory Visit: Payer: Self-pay | Admitting: Surgery

## 2022-12-03 NOTE — H&P (View-Only) (Signed)
Subjective    Chief Complaint: Post Operative Visit (right masty, check seroma//)       History of Present Illness:   .   This is a 67 year old female who was diagnosed in 02/2019 with Grade 1 invasive ductal carcinoma of the right breast.  Her tumor was located at 0700. ER/PR positive, her 2 negative.  She underwent right RSL/ SLNB on 03/28/19.  Margins were negative/ SLNx 2 negative.  She received radiation therapy and is currently on Tamoxifen.     Recently she had a mammogram that revealed a focal asymmetry in the right breast at 12:00.  Work-up revealed a 1.4 x 0.8 x 1.4 cm mass 5-6 cmfn.  Biopsy revealed DCIS high-grade/ ER positive/ PR negative with suspicion for microinvasion.  After thorough discussion with the patient, she opted for right mastectomy with sentinel lymph node biopsy on 09/14/2022.     Pathology confirmed invasive ductal carcinoma 2 x 1.5 x 1.3 cm with negative margins.  1 sentinel lymph node was negative.  She had a phone conference with oncology who plans to continue tamoxifen.  Her drain was removed on 09/23/2022.   She has developed a persistent seroma that has required multiple aspirations.  The seroma seems to quickly reaccumulate.  No sign of infection.       Medical History: Past Medical History      Past Medical History:  Diagnosis Date   Arthritis     Diabetes mellitus without complication (CMS/HHS-HCC)     GERD (gastroesophageal reflux disease)     History of cancer     Hyperlipidemia     Hypertension     Thyroid disease          Problem List     Patient Active Problem List  Diagnosis   Aortic atherosclerosis (CMS-HCC)   Basal cell carcinoma of chest wall   Bronchiectasis without complication (CMS/HHS-HCC)   Chronic back pain   Esophageal reflux   Fatty liver   Glaucoma   Hyperlipidemia   Hypertension associated with diabetes  (CMS/HHS-HCC)   ILD (interstitial lung disease) (CMS/HHS-HCC)   Malignant neoplasm of lower-outer quadrant of  right breast of female, estrogen receptor positive (CMS/HHS-HCC)   Obesity (BMI 30-39.9), unspecified   Migraine   Osteoarthritis   Pulmonary fibrosis (CMS/HHS-HCC)   Rheumatoid arthritis (CMS/HHS-HCC)   Type 2 diabetes mellitus with hyperglycemia, without long-term current use of insulin (CMS/HHS-HCC)   History of right breast cancer   Ductal carcinoma in situ (DCIS) of right breast   Invasive ductal carcinoma of breast, female, right (CMS/HHS-HCC)        Past Surgical History       Past Surgical History:  Procedure Laterality Date   COLONOSCOPY   08/14/2019    Tubular adenoma/Repeat 60yrs/TKT   EGD   08/14/2019    Gastritis/Esophagitis/No Repeat/TKT   CHOLECYSTECTOMY   10/2019    robotic assited laparoscopic by isami sakai        Allergies      Allergies  Allergen Reactions   Demerol [Meperidine] Nausea and Vomiting   Lisinopril Cough   Tylox [Oxycodone-Acetaminophen] Hallucination        Medications Ordered Prior to Encounter        Current Outpatient Medications on File Prior to Visit  Medication Sig Dispense Refill   betamethasone dipropionate (DIPROSONE) 0.05 % cream Apply topically 2 (two) times daily       calcium carbonate-vitamin D3 (CALTRATE 600+D) 600 mg(1,500mg ) -400 unit Chew chewable tablet  Take 1 tablet by mouth once daily          cetirizine (ZYRTEC) 10 MG tablet Take 10 mg by mouth once daily       docosahexaenoic acid-epa 120-180 mg Cap Take 1 capsule by mouth once daily       empagliflozin (JARDIANCE) 10 mg tablet Take 10 mg by mouth every morning before breakfast       esomeprazole (NEXIUM) 40 MG DR capsule Take 1 capsule (40 mg total) by mouth once daily 90 capsule 2   famotidine (PEPCID) 20 MG tablet Take 20 mg by mouth 2 (two) times daily       fluticasone propionate (FLONASE) 50 mcg/actuation nasal spray Place 2 sprays into both nostrils once daily       glucosamine su 2KCl-chondroit 500-400 mg Tab Take 1 tablet by mouth 3 (three) times a day           HYDROcodone-acetaminophen (NORCO) 5-325 mg tablet         leflunomide (ARAVA) 20 MG tablet Take 20 mg by mouth once daily          levothyroxine (SYNTHROID) 100 MCG tablet Take 100 mcg by mouth once daily          MIEBO, PF, 100 % Drop Apply to eye       nystatin (MYCOSTATIN) 100,000 unit/mL suspension         olmesartan-hydrochlorothiazide (BENICAR HCT) 20-12.5 mg tablet Take 1 tablet by mouth every morning       promethazine (PHENERGAN) 25 MG tablet Take 25 mg by mouth every 6 (six) hours as needed for Nausea       simvastatin (ZOCOR) 40 MG tablet Take by mouth       SUMAtriptan (IMITREX) 50 MG tablet TAKE 1 TABLET BY MOUTH AS NEEDED FOR HEADACHE       tamoxifen (NOLVADEX) 20 MG tablet          topiramate (TOPAMAX) 100 MG tablet Take 100 mg by mouth once daily          traMADoL (ULTRAM) 50 mg tablet         albuterol 90 mcg/actuation inhaler INHALE 1 TO 2 PUFFS INTO THE LUNGS EVERY 6 HOURS AS NEEDED FOR WHEEZING OR SHORTNESS OF BREATH       budesonide-formoteroL (SYMBICORT) 160-4.5 mcg/actuation inhaler         cyclobenzaprine (FLEXERIL) 10 MG tablet Take 10 mg by mouth once daily       pilocarpine (SALAGEN) 5 mg tablet Take 5 mg by mouth 3 (three) times daily       pravastatin (PRAVACHOL) 40 MG tablet Take 20 mg by mouth at bedtime       RESTASIS 0.05 % ophthalmic emulsion Place 1 drop into both eyes 2 (two) times daily        No current facility-administered medications on file prior to visit.        Family History       Family History  Problem Relation Age of Onset   Skin cancer Mother     Obesity Mother     High blood pressure (Hypertension) Mother     Hyperlipidemia (Elevated cholesterol) Mother     Diabetes Mother     Breast cancer Mother          Tobacco Use History  Social History        Tobacco Use  Smoking Status Former   Types: Cigarettes  Smokeless Tobacco Never  Social History  Social History         Socioeconomic History   Marital  status: Married  Tobacco Use   Smoking status: Former      Types: Cigarettes   Smokeless tobacco: Never  Vaping Use   Vaping status: Never Used  Substance and Sexual Activity   Alcohol use: Yes   Drug use: Never    Social Drivers of Acupuncturist Strain: Low Risk  (11/21/2022)    Received from [facility]    Overall Financial Resource Strain (CARDIA)     Difficulty of Paying Living Expenses: Not hard at all  Food Insecurity: No Food Insecurity (11/21/2022)    Received from [facility]    Hunger Vital Sign     Worried About Running Out of Food in the Last Year: Never true     Ran Out of Food in the Last Year: Never true  Transportation Needs: No Transportation Needs (11/21/2022)    Received from [facility] - Transportation     Lack of Transportation (Medical): No     Lack of Transportation (Non-Medical): No  Physical Activity: Insufficiently Active (11/21/2022)    Received from [facility]    Exercise Vital Sign     Days of Exercise per Week: 4 days     Minutes of Exercise per Session: 10 min  Stress: No Stress Concern Present (11/21/2022)    Received from [facility] - Occupational Stress Questionnaire     Feeling of Stress : Not at all  Social Connections: Moderately Isolated (11/21/2022)    Received from [facility]    Social Connection and Isolation Panel [NHANES]     Frequency of Communication with Friends and Family: Once a week     Frequency of Social Gatherings with Friends and Family: Once a week     Attends Religious Services: Never     Database administrator or Organizations: Yes     Attends Banker Meetings: 1 to 4 times per year     Marital Status: Married        Objective:         Vitals:    12/03/22 1039  PainSc: 0-No pain      Physical Exam    Constitutional:  WDWN in NAD, conversant, no obvious deformities; lying in bed comfortably Eyes:  Pupils equal, round;  sclera anicteric; moist conjunctiva; no lid lag HENT:  Oral mucosa moist; good dentition  Neck:  No masses palpated, trachea midline; no thyromegaly Lungs:  CTA bilaterally; normal respiratory effort Breasts: Right chest shows a well-healed mastectomy incision with a large recurrent seroma.  No overlying skin changes.  Left breast shows no palpable masses or nipple changes. CV:  Regular rate and rhythm; no murmurs; extremities well-perfused with no edema Abd:  +bowel sounds, soft, non-tender, no palpable organomegaly; no palpable hernias Musc: Normal gait; no apparent clubbing or cyanosis in extremities Lymphatic:  No palpable cervical or axillary lymphadenopathy Skin:  Warm, dry; no sign of jaundice Psychiatric - alert and oriented x 4; calm mood and affect       Assessment and Plan:  Diagnoses and all orders for this visit:   History of right breast cancer   Invasive ductal carcinoma of breast, female, right (CMS/HHS-HCC)       Due to the persistent recurrent seroma, we discussed going back to the  operating room, evacuating the seroma under anesthesia, and placing a Penrose drain.  The healing of this mastectomy site is likely impaired from the previous radiation therapy.  Evacuating the seroma and not allowing any fluid to reaccumulate may improve the chances of the walls of the seroma cavity scarring together.   The surgical procedure has been discussed with the patient.  Potential risks, benefits, alternative treatments, and expected outcomes have been explained.  All of the patient's questions at this time have been answered.  The likelihood of reaching the patient's treatment goal is good.  The patient understands the proposed surgical procedure and wishes to proceed.       Lissa Morales, MD  12/03/2022 11:37 AM

## 2022-12-03 NOTE — H&P (Signed)
Subjective    Chief Complaint: Post Operative Visit (right masty, check seroma//)       History of Present Illness:   .   This is a 67 year old female who was diagnosed in 02/2019 with Grade 1 invasive ductal carcinoma of the right breast.  Her tumor was located at 0700. ER/PR positive, her 2 negative.  She underwent right RSL/ SLNB on 03/28/19.  Margins were negative/ SLNx 2 negative.  She received radiation therapy and is currently on Tamoxifen.     Recently she had a mammogram that revealed a focal asymmetry in the right breast at 12:00.  Work-up revealed a 1.4 x 0.8 x 1.4 cm mass 5-6 cmfn.  Biopsy revealed DCIS high-grade/ ER positive/ PR negative with suspicion for microinvasion.  After thorough discussion with the patient, she opted for right mastectomy with sentinel lymph node biopsy on 09/14/2022.     Pathology confirmed invasive ductal carcinoma 2 x 1.5 x 1.3 cm with negative margins.  1 sentinel lymph node was negative.  She had a phone conference with oncology who plans to continue tamoxifen.  Her drain was removed on 09/23/2022.   She has developed a persistent seroma that has required multiple aspirations.  The seroma seems to quickly reaccumulate.  No sign of infection.       Medical History: Past Medical History      Past Medical History:  Diagnosis Date   Arthritis     Diabetes mellitus without complication (CMS/HHS-HCC)     GERD (gastroesophageal reflux disease)     History of cancer     Hyperlipidemia     Hypertension     Thyroid disease          Problem List     Patient Active Problem List  Diagnosis   Aortic atherosclerosis (CMS-HCC)   Basal cell carcinoma of chest wall   Bronchiectasis without complication (CMS/HHS-HCC)   Chronic back pain   Esophageal reflux   Fatty liver   Glaucoma   Hyperlipidemia   Hypertension associated with diabetes  (CMS/HHS-HCC)   ILD (interstitial lung disease) (CMS/HHS-HCC)   Malignant neoplasm of lower-outer quadrant of  right breast of female, estrogen receptor positive (CMS/HHS-HCC)   Obesity (BMI 30-39.9), unspecified   Migraine   Osteoarthritis   Pulmonary fibrosis (CMS/HHS-HCC)   Rheumatoid arthritis (CMS/HHS-HCC)   Type 2 diabetes mellitus with hyperglycemia, without long-term current use of insulin (CMS/HHS-HCC)   History of right breast cancer   Ductal carcinoma in situ (DCIS) of right breast   Invasive ductal carcinoma of breast, female, right (CMS/HHS-HCC)        Past Surgical History       Past Surgical History:  Procedure Laterality Date   COLONOSCOPY   08/14/2019    Tubular adenoma/Repeat 97yrs/TKT   EGD   08/14/2019    Gastritis/Esophagitis/No Repeat/TKT   CHOLECYSTECTOMY   10/2019    robotic assited laparoscopic by isami sakai        Allergies      Allergies  Allergen Reactions   Demerol [Meperidine] Nausea and Vomiting   Lisinopril Cough   Tylox [Oxycodone-Acetaminophen] Hallucination        Medications Ordered Prior to Encounter        Current Outpatient Medications on File Prior to Visit  Medication Sig Dispense Refill   betamethasone dipropionate (DIPROSONE) 0.05 % cream Apply topically 2 (two) times daily       calcium carbonate-vitamin D3 (CALTRATE 600+D) 600 mg(1,500mg ) -400 unit Chew chewable tablet  Take 1 tablet by mouth once daily          cetirizine (ZYRTEC) 10 MG tablet Take 10 mg by mouth once daily       docosahexaenoic acid-epa 120-180 mg Cap Take 1 capsule by mouth once daily       empagliflozin (JARDIANCE) 10 mg tablet Take 10 mg by mouth every morning before breakfast       esomeprazole (NEXIUM) 40 MG DR capsule Take 1 capsule (40 mg total) by mouth once daily 90 capsule 2   famotidine (PEPCID) 20 MG tablet Take 20 mg by mouth 2 (two) times daily       fluticasone propionate (FLONASE) 50 mcg/actuation nasal spray Place 2 sprays into both nostrils once daily       glucosamine su 2KCl-chondroit 500-400 mg Tab Take 1 tablet by mouth 3 (three) times a day           HYDROcodone-acetaminophen (NORCO) 5-325 mg tablet         leflunomide (ARAVA) 20 MG tablet Take 20 mg by mouth once daily          levothyroxine (SYNTHROID) 100 MCG tablet Take 100 mcg by mouth once daily          MIEBO, PF, 100 % Drop Apply to eye       nystatin (MYCOSTATIN) 100,000 unit/mL suspension         olmesartan-hydrochlorothiazide (BENICAR HCT) 20-12.5 mg tablet Take 1 tablet by mouth every morning       promethazine (PHENERGAN) 25 MG tablet Take 25 mg by mouth every 6 (six) hours as needed for Nausea       simvastatin (ZOCOR) 40 MG tablet Take by mouth       SUMAtriptan (IMITREX) 50 MG tablet TAKE 1 TABLET BY MOUTH AS NEEDED FOR HEADACHE       tamoxifen (NOLVADEX) 20 MG tablet          topiramate (TOPAMAX) 100 MG tablet Take 100 mg by mouth once daily          traMADoL (ULTRAM) 50 mg tablet         albuterol 90 mcg/actuation inhaler INHALE 1 TO 2 PUFFS INTO THE LUNGS EVERY 6 HOURS AS NEEDED FOR WHEEZING OR SHORTNESS OF BREATH       budesonide-formoteroL (SYMBICORT) 160-4.5 mcg/actuation inhaler         cyclobenzaprine (FLEXERIL) 10 MG tablet Take 10 mg by mouth once daily       pilocarpine (SALAGEN) 5 mg tablet Take 5 mg by mouth 3 (three) times daily       pravastatin (PRAVACHOL) 40 MG tablet Take 20 mg by mouth at bedtime       RESTASIS 0.05 % ophthalmic emulsion Place 1 drop into both eyes 2 (two) times daily        No current facility-administered medications on file prior to visit.        Family History       Family History  Problem Relation Age of Onset   Skin cancer Mother     Obesity Mother     High blood pressure (Hypertension) Mother     Hyperlipidemia (Elevated cholesterol) Mother     Diabetes Mother     Breast cancer Mother          Tobacco Use History  Social History        Tobacco Use  Smoking Status Former   Types: Cigarettes  Smokeless Tobacco Never  Social History  Social History         Socioeconomic History   Marital  status: Married  Tobacco Use   Smoking status: Former      Types: Cigarettes   Smokeless tobacco: Never  Vaping Use   Vaping status: Never Used  Substance and Sexual Activity   Alcohol use: Yes   Drug use: Never    Social Drivers of Acupuncturist Strain: Low Risk  (11/21/2022)    Received from [facility]    Overall Financial Resource Strain (CARDIA)     Difficulty of Paying Living Expenses: Not hard at all  Food Insecurity: No Food Insecurity (11/21/2022)    Received from [facility]    Hunger Vital Sign     Worried About Running Out of Food in the Last Year: Never true     Ran Out of Food in the Last Year: Never true  Transportation Needs: No Transportation Needs (11/21/2022)    Received from [facility] - Transportation     Lack of Transportation (Medical): No     Lack of Transportation (Non-Medical): No  Physical Activity: Insufficiently Active (11/21/2022)    Received from [facility]    Exercise Vital Sign     Days of Exercise per Week: 4 days     Minutes of Exercise per Session: 10 min  Stress: No Stress Concern Present (11/21/2022)    Received from [facility] - Occupational Stress Questionnaire     Feeling of Stress : Not at all  Social Connections: Moderately Isolated (11/21/2022)    Received from [facility]    Social Connection and Isolation Panel [NHANES]     Frequency of Communication with Friends and Family: Once a week     Frequency of Social Gatherings with Friends and Family: Once a week     Attends Religious Services: Never     Database administrator or Organizations: Yes     Attends Banker Meetings: 1 to 4 times per year     Marital Status: Married        Objective:         Vitals:    12/03/22 1039  PainSc: 0-No pain      Physical Exam    Constitutional:  WDWN in NAD, conversant, no obvious deformities; lying in bed comfortably Eyes:  Pupils equal, round;  sclera anicteric; moist conjunctiva; no lid lag HENT:  Oral mucosa moist; good dentition  Neck:  No masses palpated, trachea midline; no thyromegaly Lungs:  CTA bilaterally; normal respiratory effort Breasts: Right chest shows a well-healed mastectomy incision with a large recurrent seroma.  No overlying skin changes.  Left breast shows no palpable masses or nipple changes. CV:  Regular rate and rhythm; no murmurs; extremities well-perfused with no edema Abd:  +bowel sounds, soft, non-tender, no palpable organomegaly; no palpable hernias Musc: Normal gait; no apparent clubbing or cyanosis in extremities Lymphatic:  No palpable cervical or axillary lymphadenopathy Skin:  Warm, dry; no sign of jaundice Psychiatric - alert and oriented x 4; calm mood and affect       Assessment and Plan:  Diagnoses and all orders for this visit:   History of right breast cancer   Invasive ductal carcinoma of breast, female, right (CMS/HHS-HCC)       Due to the persistent recurrent seroma, we discussed going back to the  operating room, evacuating the seroma under anesthesia, and placing a Penrose drain.  The healing of this mastectomy site is likely impaired from the previous radiation therapy.  Evacuating the seroma and not allowing any fluid to reaccumulate may improve the chances of the walls of the seroma cavity scarring together.   The surgical procedure has been discussed with the patient.  Potential risks, benefits, alternative treatments, and expected outcomes have been explained.  All of the patient's questions at this time have been answered.  The likelihood of reaching the patient's treatment goal is good.  The patient understands the proposed surgical procedure and wishes to proceed.       Lissa Morales, MD  12/03/2022 11:37 AM

## 2022-12-06 ENCOUNTER — Other Ambulatory Visit (INDEPENDENT_AMBULATORY_CARE_PROVIDER_SITE_OTHER): Payer: Medicare HMO

## 2022-12-06 ENCOUNTER — Encounter (HOSPITAL_BASED_OUTPATIENT_CLINIC_OR_DEPARTMENT_OTHER): Payer: Self-pay | Admitting: Surgery

## 2022-12-06 DIAGNOSIS — E118 Type 2 diabetes mellitus with unspecified complications: Secondary | ICD-10-CM | POA: Diagnosis not present

## 2022-12-06 LAB — COMPREHENSIVE METABOLIC PANEL
ALT: 11 U/L (ref 0–35)
AST: 18 U/L (ref 0–37)
Albumin: 4.1 g/dL (ref 3.5–5.2)
Alkaline Phosphatase: 49 U/L (ref 39–117)
BUN: 14 mg/dL (ref 6–23)
CO2: 27 meq/L (ref 19–32)
Calcium: 9.4 mg/dL (ref 8.4–10.5)
Chloride: 106 meq/L (ref 96–112)
Creatinine, Ser: 0.65 mg/dL (ref 0.40–1.20)
GFR: 89.95 mL/min (ref >60.00)
Glucose, Bld: 122 mg/dL — ABNORMAL HIGH (ref 70–99)
Potassium: 4 meq/L (ref 3.5–5.1)
Sodium: 142 meq/L (ref 135–145)
Total Bilirubin: 0.3 mg/dL (ref 0.2–1.2)
Total Protein: 6.6 g/dL (ref 6.0–8.3)

## 2022-12-09 MED ORDER — CHLORHEXIDINE GLUCONATE CLOTH 2 % EX PADS: 6.0000 | MEDICATED_PAD | Freq: Once | CUTANEOUS | Status: DC

## 2022-12-09 NOTE — Progress Notes (Signed)

## 2022-12-10 NOTE — Progress Notes (Unsigned)
Office Visit Note  Patient: Cheryl Knight             Date of Birth: [birth year]           MRN: [id number]             PCP: Dana Allan, MD Referring: Dana Allan, MD Visit Date: 12/24/2022 Occupation: @GUAROCC @  Subjective:    History of Present Illness: Cheryl Knight is a 67 y.o. female with history of seropositive rheumatoid arthritis and ILD.  Patient remains on Arava 20 mg 1 tablet by mouth daily.   CMP updated on 12/06/22. CBC updated on 11/18/22.    Discussed the importance of holding arava if she develops signs or symptoms of an infection and to resume once the infection has completley cleared.   Activities of Daily Living:  Patient reports morning stiffness for *** {minute/hour:19697}.   Patient {ACTIONS;DENIES/REPORTS:21021675::"Denies"} nocturnal pain.  Difficulty dressing/grooming: {ACTIONS;DENIES/REPORTS:21021675::"Denies"} Difficulty climbing stairs: {ACTIONS;DENIES/REPORTS:21021675::"Denies"} Difficulty getting out of chair: {ACTIONS;DENIES/REPORTS:21021675::"Denies"} Difficulty using hands for taps, buttons, cutlery, and/or writing: {ACTIONS;DENIES/REPORTS:21021675::"Denies"}  No Rheumatology ROS completed.   PMFS History:  Patient Active Problem List   Diagnosis Date Noted   Seroma of breast 11/09/2022   Diarrhea 11/09/2022   Need for influenza vaccination 11/09/2022   Type 2 diabetes with complication (HCC) 11/09/2022   Ductal carcinoma in situ (DCIS) of right breast 08/26/2022   ILD (interstitial lung disease) (HCC) 07/05/2022   Chronic back pain 11/07/2021   Fatty liver 06/24/2020   Pulmonary fibrosis (HCC) 10/01/2019   Pulmonary nodules 10/01/2019   Bronchiectasis without complication (HCC) 10/01/2019   Aortic atherosclerosis (HCC) 10/01/2019   Hypertension associated with diabetes (HCC) 10/01/2019   Paresthesia of upper extremity 10/01/2019   Hiatal hernia 08/16/2019   Dry eye syndrome 08/05/2019   Glaucoma 08/05/2019   Hypertensive  retinopathy 08/05/2019   Arteriosclerosis 08/05/2019   Pterygium 08/05/2019   Ocular rosacea 08/05/2019   Malignant neoplasm of lower-outer quadrant of right breast of female, estrogen receptor positive (HCC) 03/19/2019   Allergic rhinitis 02/08/2019   Osteoarthritis    Obesity (BMI 30-39.9) 12/30/2015   Basal cell carcinoma of chest wall 10/05/2011   Hyperlipidemia 02/27/2008   Hypothyroidism 02/21/2007   Migraine 02/21/2007   VAGINITIS, ATROPHIC 02/21/2007   Psoriasis 02/21/2007   Sicca syndrome, unspecified 02/21/2007   GERD 06/29/2006   Rheumatoid arthritis (HCC) 06/29/2006    Past Medical History:  Diagnosis Date   Cancer (HCC) 02/2019   right breast IDC   Cataracts, bilateral    tbd surgery 2021   Complication of anesthesia    Cough    Diabetes mellitus without complication (HCC)    Difficult intubation    TRACH AT 19   Dry eye syndrome    Dyspnea    W/ COUGHING- was due to lisinopril and septic gallbladder   Family history of adverse reaction to anesthesia    sister coded with propofol use   Family history of breast cancer    Family history of lung cancer    Family history of prostate cancer    Family history of skin cancer    GERD (gastroesophageal reflux disease)    Glaucoma    stable glaucoma suspect 01/19/21 [facility]   Headache    High cholesterol    History of hiatal hernia    History of pneumonia    walking pneumonia "years ago"   Hypertension    Hypothyroidism    Irritable bowel    Osteoarthritis    i.e  right knee    PONV (postoperative nausea and vomiting)    Pre-diabetes    Retinopathy    htn 01/19/21 [facility]   Rheumatoid arthritis (HCC)    Squamous cell carcinoma of skin of chest    removed   Thyroid disease     Family History  Problem Relation Age of Onset   Hypertension Mother    Thyroid disease Mother    Breast cancer Mother 39       breast dx age 59 died age 59    Prostate cancer Father 68       metastatic   Stroke Maternal  Grandmother    Skin cancer Maternal Uncle        dx. in his 22s   Lung cancer Maternal Uncle        smoker, died age 47   Past Surgical History:  Procedure Laterality Date   ABDOMINAL HYSTERECTOMY     age 69 ovaries out   BREAST LUMPECTOMY WITH RADIOACTIVE SEED AND SENTINEL LYMPH NODE BIOPSY Right 03/28/2019   Procedure: RIGHT BREAST LUMPECTOMY WITH RADIOACTIVE SEED AND SENTINEL LYMPH NODE BIOPSY;  Surgeon: Manus Rudd, MD;  Location: [facility];  Service: General;  Laterality: Right;  PEC BLOCK FOR POST OP PAIN   BREAST SURGERY     CATARACT EXTRACTION, BILATERAL     CHOLECYSTECTOMY     10/2019   COLONOSCOPY     x2   ESOPHAGEAL DILATION     Dr. Juanda Chance 15 years from 2021- x2   MASTECTOMY W/ SENTINEL NODE BIOPSY Right 09/14/2022   Procedure: RIGHT MASTECTOMY WITH SENTINEL LYMPH NODE BIOPSY;  Surgeon: Manus Rudd, MD;  Location: [facility];  Service: General;  Laterality: Right;  LMA PEC BLOCK   SKIN BIOPSY     04/2022, right arm, left leg   TONSILLECTOMY     67 years old- had trach   TRACHEOSTOMY     67 years old- placed and removed within 24 hours during tonsillectomy   TUBAL LIGATION     Social History   Social History Narrative   Nurse at [employer]   Has a beach house    Immunization History  Administered Date(s) Administered   Fluad Quad(high Dose 65+) 03/11/2020, 10/29/2021   Fluad Trivalent(High Dose 65+) 11/08/2022   H1N1 02/27/2008   Influenza-Unspecified 11/18/2017   PFIZER(Purple Top)SARS-COV-2 Vaccination 03/02/2019, 03/29/2019, 01/01/2020   PNEUMOCOCCAL CONJUGATE-20 07/22/2021   Pfizer Covid-19 Vaccine Bivalent Booster 14yrs & up 02/02/2021   Pneumococcal Conjugate-13 10/03/2019   Td 01/19/2004   Tdap 12/17/2014     Objective: Vital Signs: There were no vitals taken for this visit.   Physical Exam Vitals and nursing note reviewed.  Constitutional:      Appearance: She is well-developed.  HENT:     Head: Normocephalic and atraumatic.   Eyes:     Conjunctiva/sclera: Conjunctivae normal.  Cardiovascular:     Rate and Rhythm: Normal rate and regular rhythm.     Heart sounds: Normal heart sounds.  Pulmonary:     Effort: Pulmonary effort is normal.     Breath sounds: Normal breath sounds.  Abdominal:     General: Bowel sounds are normal.     Palpations: Abdomen is soft.  Musculoskeletal:     Cervical back: Normal range of motion.  Lymphadenopathy:     Cervical: No cervical adenopathy.  Skin:    General: Skin is warm and dry.     Capillary Refill: Capillary refill takes less  than 2 seconds.  Neurological:     Mental Status: She is alert and oriented to person, place, and time.  Psychiatric:        Behavior: Behavior normal.      Musculoskeletal Exam: ***  CDAI Exam: CDAI Score: -- Patient Global: --; Provider Global: -- Swollen: --; Tender: -- Joint Exam 12/24/2022   No joint exam has been documented for this visit   There is currently no information documented on the homunculus. Go to the Rheumatology activity and complete the homunculus joint exam.  Investigation: No additional findings.  Imaging: No results found.  Recent Labs: Lab Results  Component Value Date   WBC 4.7 11/18/2022   HGB 13.3 11/18/2022   PLT 275.0 11/18/2022   NA 142 12/06/2022   K 4.0 12/06/2022   CL 106 12/06/2022   CO2 27 12/06/2022   GLUCOSE 122 (H) 12/06/2022   BUN 14 12/06/2022   CREATININE 0.65 12/06/2022   BILITOT 0.3 12/06/2022   ALKPHOS 49 12/06/2022   AST 18 12/06/2022   ALT 11 12/06/2022   PROT 6.6 12/06/2022   ALBUMIN 4.1 12/06/2022   CALCIUM 9.4 12/06/2022   GFRAA 108 04/22/2020   QFTBGOLDPLUS NEGATIVE 04/05/2019    Speciality Comments: No specialty comments available.  Procedures:  No procedures performed Allergies: Pentazocine lactate, Tape, Demerol [meperidine], and Lisinopril   Assessment / Plan:     Visit Diagnoses: Rheumatoid arthritis involving multiple sites with positive rheumatoid  factor (HCC)  High risk medication use  ILD (interstitial lung disease) (HCC)  History of asthma  Sicca syndrome (HCC)  Primary osteoarthritis of both knees  Primary osteoarthritis of both feet  Psoriasis  History of hyperlipidemia  History of hypothyroidism  Malignant neoplasm of lower-outer quadrant of right breast of female, estrogen receptor positive (HCC)  History of migraine  History of gastroesophageal reflux (GERD)  Orders: No orders of the defined types were placed in this encounter.  No orders of the defined types were placed in this encounter.   Face-to-face time spent with patient was *** minutes. Greater than 50% of time was spent in counseling and coordination of care.  Follow-Up Instructions: No follow-ups on file.   Gearldine Bienenstock, PA-C  Note - This record has been created using Dragon software.  Chart creation errors have been sought, but may not always  have been located. Such creation errors do not reflect on  the standard of medical care.

## 2022-12-13 ENCOUNTER — Ambulatory Visit (HOSPITAL_BASED_OUTPATIENT_CLINIC_OR_DEPARTMENT_OTHER): Payer: Medicare HMO | Admitting: Anesthesiology

## 2022-12-13 ENCOUNTER — Encounter (HOSPITAL_BASED_OUTPATIENT_CLINIC_OR_DEPARTMENT_OTHER): Payer: Self-pay | Admitting: Surgery

## 2022-12-13 ENCOUNTER — Ambulatory Visit (HOSPITAL_BASED_OUTPATIENT_CLINIC_OR_DEPARTMENT_OTHER): Payer: Self-pay | Admitting: Anesthesiology

## 2022-12-13 ENCOUNTER — Other Ambulatory Visit: Payer: Self-pay

## 2022-12-13 ENCOUNTER — Ambulatory Visit: Payer: Medicare HMO | Admitting: Physician Assistant

## 2022-12-13 ENCOUNTER — Encounter (HOSPITAL_BASED_OUTPATIENT_CLINIC_OR_DEPARTMENT_OTHER)
Admission: RE | Disposition: A | Discharge status: Home / Self Care | Payer: Self-pay | Source: Home / Self Care | Attending: Surgery

## 2022-12-13 ENCOUNTER — Ambulatory Visit (HOSPITAL_BASED_OUTPATIENT_CLINIC_OR_DEPARTMENT_OTHER)
Admission: RE | Admit: 2022-12-13 | Discharge: 2022-12-13 | Disposition: A | Discharge status: Home / Self Care | Payer: Medicare HMO | Attending: Surgery | Admitting: Surgery

## 2022-12-13 DIAGNOSIS — M3500 Sicca syndrome, unspecified: Secondary | ICD-10-CM

## 2022-12-13 DIAGNOSIS — Z170 Estrogen receptor positive status [ER+]: Secondary | ICD-10-CM | POA: Insufficient documentation

## 2022-12-13 DIAGNOSIS — L7634 Postprocedural seroma of skin and subcutaneous tissue following other procedure: Secondary | ICD-10-CM | POA: Diagnosis not present

## 2022-12-13 DIAGNOSIS — E118 Type 2 diabetes mellitus with unspecified complications: Secondary | ICD-10-CM

## 2022-12-13 DIAGNOSIS — C50911 Malignant neoplasm of unspecified site of right female breast: Secondary | ICD-10-CM | POA: Insufficient documentation

## 2022-12-13 DIAGNOSIS — Z79899 Other long term (current) drug therapy: Secondary | ICD-10-CM

## 2022-12-13 DIAGNOSIS — I10 Essential (primary) hypertension: Secondary | ICD-10-CM | POA: Diagnosis not present

## 2022-12-13 DIAGNOSIS — Z8639 Personal history of other endocrine, nutritional and metabolic disease: Secondary | ICD-10-CM

## 2022-12-13 DIAGNOSIS — M96843 Postprocedural seroma of a musculoskeletal structure following other procedure: Secondary | ICD-10-CM | POA: Insufficient documentation

## 2022-12-13 DIAGNOSIS — Z79810 Long term (current) use of selective estrogen receptor modulators (SERMs): Secondary | ICD-10-CM | POA: Insufficient documentation

## 2022-12-13 DIAGNOSIS — M0579 Rheumatoid arthritis with rheumatoid factor of multiple sites without organ or systems involvement: Secondary | ICD-10-CM

## 2022-12-13 DIAGNOSIS — Z87891 Personal history of nicotine dependence: Secondary | ICD-10-CM | POA: Insufficient documentation

## 2022-12-13 DIAGNOSIS — Z9011 Acquired absence of right breast and nipple: Secondary | ICD-10-CM | POA: Diagnosis not present

## 2022-12-13 DIAGNOSIS — E1159 Type 2 diabetes mellitus with other circulatory complications: Secondary | ICD-10-CM | POA: Diagnosis not present

## 2022-12-13 DIAGNOSIS — E785 Hyperlipidemia, unspecified: Secondary | ICD-10-CM | POA: Insufficient documentation

## 2022-12-13 DIAGNOSIS — L7633 Postprocedural seroma of skin and subcutaneous tissue following a dermatologic procedure: Secondary | ICD-10-CM

## 2022-12-13 DIAGNOSIS — E119 Type 2 diabetes mellitus without complications: Secondary | ICD-10-CM | POA: Diagnosis not present

## 2022-12-13 DIAGNOSIS — Z8709 Personal history of other diseases of the respiratory system: Secondary | ICD-10-CM

## 2022-12-13 DIAGNOSIS — Z8669 Personal history of other diseases of the nervous system and sense organs: Secondary | ICD-10-CM

## 2022-12-13 DIAGNOSIS — C50511 Malignant neoplasm of lower-outer quadrant of right female breast: Secondary | ICD-10-CM

## 2022-12-13 DIAGNOSIS — M19071 Primary osteoarthritis, right ankle and foot: Secondary | ICD-10-CM

## 2022-12-13 DIAGNOSIS — M170 Bilateral primary osteoarthritis of knee: Secondary | ICD-10-CM

## 2022-12-13 DIAGNOSIS — Z8719 Personal history of other diseases of the digestive system: Secondary | ICD-10-CM

## 2022-12-13 DIAGNOSIS — J849 Interstitial pulmonary disease, unspecified: Secondary | ICD-10-CM

## 2022-12-13 DIAGNOSIS — L409 Psoriasis, unspecified: Secondary | ICD-10-CM

## 2022-12-13 LAB — GLUCOSE, CAPILLARY
Glucose-Capillary: 111 mg/dL — ABNORMAL HIGH (ref 70–99)
Glucose-Capillary: 120 mg/dL — ABNORMAL HIGH (ref 70–99)

## 2022-12-13 SURGERY — INCISION AND DRAINAGE OF HEMATOMA, SOFT TISSUES OF CHEST
Anesthesia: General | Site: Breast | Laterality: Right

## 2022-12-13 MED ORDER — LACTATED RINGERS IV SOLN
INTRAVENOUS | Status: DC
Start: 2022-12-13 — End: 2022-12-13

## 2022-12-13 MED ORDER — ROCURONIUM BROMIDE 10 MG/ML (PF) SYRINGE
PREFILLED_SYRINGE | INTRAVENOUS | Status: AC
  Filled 2022-12-13: qty 10

## 2022-12-13 MED ORDER — CEFAZOLIN SODIUM-DEXTROSE 2-4 GM/100ML-% IV SOLN
2.0000 g | INTRAVENOUS | Status: AC
  Administered 2022-12-13: 2 g via INTRAVENOUS

## 2022-12-13 MED ORDER — LIDOCAINE 2% (20 MG/ML) 5 ML SYRINGE
INTRAMUSCULAR | Status: AC
  Filled 2022-12-13: qty 5

## 2022-12-13 MED ORDER — BUPIVACAINE-EPINEPHRINE 0.25% -1:200000 IJ SOLN
INTRAMUSCULAR | Status: DC | PRN
  Administered 2022-12-13: 10 mL

## 2022-12-13 MED ORDER — FENTANYL CITRATE (PF) 100 MCG/2ML IJ SOLN: 25.0000 ug | INTRAMUSCULAR | Status: DC | PRN

## 2022-12-13 MED ORDER — LIDOCAINE 2% (20 MG/ML) 5 ML SYRINGE
INTRAMUSCULAR | Status: DC | PRN
  Administered 2022-12-13: 100 mg via INTRAVENOUS

## 2022-12-13 MED ORDER — SODIUM CHLORIDE 0.9 % IV SOLN: INTRAVENOUS | Status: DC | PRN

## 2022-12-13 MED ORDER — OXYCODONE HCL 5 MG/5ML PO SOLN: 5.0000 mg | Freq: Once | ORAL | Status: DC | PRN

## 2022-12-13 MED ORDER — ACETAMINOPHEN 500 MG PO TABS
ORAL_TABLET | ORAL | Status: AC
  Filled 2022-12-13: qty 2

## 2022-12-13 MED ORDER — ONDANSETRON HCL 4 MG/2ML IJ SOLN
INTRAMUSCULAR | Status: DC | PRN
  Administered 2022-12-13: 4 mg via INTRAVENOUS

## 2022-12-13 MED ORDER — CEFAZOLIN SODIUM-DEXTROSE 2-4 GM/100ML-% IV SOLN
INTRAVENOUS | Status: AC
  Filled 2022-12-13: qty 100

## 2022-12-13 MED ORDER — ONDANSETRON HCL 4 MG/2ML IJ SOLN
INTRAMUSCULAR | Status: AC
  Filled 2022-12-13: qty 2

## 2022-12-13 MED ORDER — FENTANYL CITRATE (PF) 100 MCG/2ML IJ SOLN
INTRAMUSCULAR | Status: AC
  Filled 2022-12-13: qty 2

## 2022-12-13 MED ORDER — ACETAMINOPHEN 500 MG PO TABS
1000.0000 mg | ORAL_TABLET | ORAL | Status: AC
  Administered 2022-12-13: 1000 mg via ORAL

## 2022-12-13 MED ORDER — PROPOFOL 10 MG/ML IV BOLUS
INTRAVENOUS | Status: AC
  Filled 2022-12-13: qty 20

## 2022-12-13 MED ORDER — OXYCODONE HCL 5 MG PO TABS: 5.0000 mg | ORAL_TABLET | Freq: Once | ORAL | Status: DC | PRN

## 2022-12-13 MED ORDER — FENTANYL CITRATE (PF) 250 MCG/5ML IJ SOLN
INTRAMUSCULAR | Status: DC | PRN
  Administered 2022-12-13: 50 ug via INTRAVENOUS

## 2022-12-13 MED ORDER — PROPOFOL 10 MG/ML IV BOLUS
INTRAVENOUS | Status: DC | PRN
  Administered 2022-12-13: 10 mg via INTRAVENOUS
  Administered 2022-12-13: 150 mg via INTRAVENOUS

## 2022-12-13 MED ORDER — AMISULPRIDE (ANTIEMETIC) 5 MG/2ML IV SOLN: 10.0000 mg | Freq: Once | INTRAVENOUS | Status: DC | PRN

## 2022-12-13 MED ORDER — DEXAMETHASONE SODIUM PHOSPHATE 10 MG/ML IJ SOLN
INTRAMUSCULAR | Status: DC | PRN
  Administered 2022-12-13: 10 mg via INTRAVENOUS

## 2022-12-13 MED ORDER — DEXAMETHASONE SODIUM PHOSPHATE 10 MG/ML IJ SOLN
INTRAMUSCULAR | Status: AC
  Filled 2022-12-13: qty 1

## 2022-12-13 SURGICAL SUPPLY — 45 items
BENZOIN TINCTURE PRP APPL 2/3 (GAUZE/BANDAGES/DRESSINGS) ×1 IMPLANT
BLADE HEX COATED 2.75 (ELECTRODE) ×1 IMPLANT
BLADE SURG 15 STRL LF DISP TIS (BLADE) ×1 IMPLANT
CANISTER SUCT 1200ML W/VALVE (MISCELLANEOUS) ×1 IMPLANT
CHLORAPREP W/TINT 26 (MISCELLANEOUS) ×1 IMPLANT
COVER BACK TABLE 60X90IN (DRAPES) IMPLANT
COVER MAYO STAND STRL (DRAPES) ×1 IMPLANT
DRAIN PENROSE .5X12 LATEX STL (DRAIN) IMPLANT
DRAPE LAPAROSCOPIC ABDOMINAL (DRAPES) IMPLANT
DRAPE LAPAROTOMY 100X72 PEDS (DRAPES) IMPLANT
DRAPE UTILITY XL STRL (DRAPES) ×1 IMPLANT
DRSG TEGADERM 4X4.75 (GAUZE/BANDAGES/DRESSINGS) ×1 IMPLANT
ELECT REM PT RETURN 9FT ADLT (ELECTROSURGICAL) ×1
ELECTRODE REM PT RTRN 9FT ADLT (ELECTROSURGICAL) ×1 IMPLANT
GAUZE PAD ABD 8X10 STRL (GAUZE/BANDAGES/DRESSINGS) ×1 IMPLANT
GAUZE PETROLATUM 1 X8 (GAUZE/BANDAGES/DRESSINGS) IMPLANT
GAUZE SPONGE 2X2 STRL 8-PLY (GAUZE/BANDAGES/DRESSINGS) IMPLANT
GAUZE SPONGE 4X4 12PLY STRL (GAUZE/BANDAGES/DRESSINGS) IMPLANT
GAUZE SPONGE 4X4 12PLY STRL LF (GAUZE/BANDAGES/DRESSINGS) IMPLANT
GLOVE BIO SURGEON STRL SZ7 (GLOVE) ×1 IMPLANT
GLOVE BIOGEL PI IND STRL 7.5 (GLOVE) ×1 IMPLANT
GOWN STRL REUS W/ TWL LRG LVL3 (GOWN DISPOSABLE) ×1 IMPLANT
NDL HYPO 25X1 1.5 SAFETY (NEEDLE) ×1 IMPLANT
NEEDLE HYPO 25X1 1.5 SAFETY (NEEDLE) ×1
NS IRRIG 1000ML POUR BTL (IV SOLUTION) IMPLANT
PACK BASIN DAY SURGERY FS (CUSTOM PROCEDURE TRAY) ×1 IMPLANT
PENCIL SMOKE EVACUATOR (MISCELLANEOUS) ×1 IMPLANT
SLEEVE SCD COMPRESS KNEE MED (STOCKING) ×1 IMPLANT
SPIKE FLUID TRANSFER (MISCELLANEOUS) ×1 IMPLANT
SPONGE SURGIFOAM ABS GEL 100 (HEMOSTASIS) IMPLANT
SPONGE T-LAP 4X18 ~~LOC~~+RFID (SPONGE) ×1 IMPLANT
STRIP CLOSURE SKIN 1/2X4 (GAUZE/BANDAGES/DRESSINGS) ×1 IMPLANT
SURGILUBE 2OZ TUBE FLIPTOP (MISCELLANEOUS) IMPLANT
SUT CHROMIC 3 0 SH 27 (SUTURE) IMPLANT
SUT ETHILON 2 0 FS 18 (SUTURE) IMPLANT
SUT MON AB 4-0 PC3 18 (SUTURE) ×1 IMPLANT
SUT VIC AB 3-0 SH 27X BRD (SUTURE) ×1 IMPLANT
SWAB COLLECTION DEVICE MRSA (MISCELLANEOUS) IMPLANT
SWAB CULTURE ESWAB REG 1ML (MISCELLANEOUS) IMPLANT
SYR CONTROL 10ML LL (SYRINGE) ×1 IMPLANT
TOWEL GREEN STERILE FF (TOWEL DISPOSABLE) ×2 IMPLANT
TRAY DSU PREP LF (CUSTOM PROCEDURE TRAY) IMPLANT
TUBE CONNECTING 20X1/4 (TUBING) ×1 IMPLANT
UNDERPAD 30X36 HEAVY ABSORB (UNDERPADS AND DIAPERS) ×1 IMPLANT
YANKAUER SUCT BULB TIP NO VENT (SUCTIONS) ×1 IMPLANT

## 2022-12-13 NOTE — Transfer of Care (Signed)
Immediate Anesthesia Transfer of Care Note  Patient: Cheryl Knight  Procedure(s) Performed: DRAINAGE OF RIGHT CHEST WALL SEROMA SOFT TISSUES OF CHEST (Right: Breast)  Patient Location: PACU  Anesthesia Type:General  Level of Consciousness: awake, alert , and oriented  Airway & Oxygen Therapy: Patient Spontanous Breathing and Patient connected to face mask oxygen  Post-op Assessment: Report given to RN and Post -op Vital signs reviewed and stable  Post vital signs: Reviewed and stable  Last Vitals:  Vitals Value Taken Time  BP 132/77 12/13/22 1247  Temp    Pulse 74 12/13/22 1248  Resp 14 12/13/22 1248  SpO2 98 % 12/13/22 1248  Vitals shown include unfiled device data.  Last Pain:  Vitals:   12/13/22 1132  TempSrc: Temporal  PainSc: 0-No pain         Complications: No notable events documented.

## 2022-12-13 NOTE — Anesthesia Preprocedure Evaluation (Addendum)
Anesthesia Evaluation  Patient identified by MRN, date of birth, ID band Patient awake    Reviewed: Allergy & Precautions, NPO status , Patient's Chart, lab work & pertinent test results  History of Anesthesia Complications (+) PONV, DIFFICULT AIRWAY, Family history of anesthesia reaction and history of anesthetic complications (patient reports her sister coded with propofol)  Airway Mallampati: III  TM Distance: >3 FB Neck ROM: Full   Comment: Previous grade I view with McGrath 3, easy mask Dental  (+) Dental Advisory Given   Pulmonary neg shortness of breath, neg sleep apnea, neg COPD, neg recent URI, former smoker ILD, Bronchiectasis   Pulmonary exam normal breath sounds clear to auscultation       Cardiovascular hypertension (amlodipine, olmesartan-HCTZ), Pt. on medications (-) angina (-) Past MI, (-) Cardiac Stents and (-) CABG (-) dysrhythmias  Rhythm:Regular Rate:Normal  HLD   Neuro/Psych  Headaches  Neuromuscular disease (chronic back pain)    GI/Hepatic hiatal hernia,GERD  Medicated,,Fatty liver   Endo/Other  diabetes, Type 2Hypothyroidism    Renal/GU negative Renal ROS     Musculoskeletal  (+) Arthritis , Osteoarthritis and Rheumatoid disorders,    Abdominal   Peds  Hematology negative hematology ROS (+)   Anesthesia Other Findings Sicca syndrome  Reproductive/Obstetrics Breast cancer                             Anesthesia Physical Anesthesia Plan  ASA: 3  Anesthesia Plan: General   Post-op Pain Management: Tylenol PO (pre-op)*   Induction: Intravenous  PONV Risk Score and Plan: 4 or greater and Ondansetron, Dexamethasone and Treatment may vary due to age or medical condition  Airway Management Planned: LMA  Additional Equipment:   Intra-op Plan:   Post-operative Plan: Extubation in OR  Informed Consent: I have reviewed the patients History and Physical, chart,  labs and discussed the procedure including the risks, benefits and alternatives for the proposed anesthesia with the patient or authorized representative who has indicated his/her understanding and acceptance.     Dental advisory given  Plan Discussed with: CRNA and Anesthesiologist  Anesthesia Plan Comments: (Risks of general anesthesia discussed including, but not limited to, sore throat, hoarse voice, chipped/damaged teeth, injury to vocal cords, nausea and vomiting, allergic reactions, lung infection, heart attack, stroke, and death. All questions answered. )        Anesthesia Quick Evaluation

## 2022-12-13 NOTE — Interval H&P Note (Signed)
History and Physical Interval Note:  12/13/2022 11:10 AM  [name]  has presented today for surgery, with the diagnosis of BREAST CANCER WITH POST MASTECTOMY SEROMA.  The various methods of treatment have been discussed with the patient and family. After consideration of risks, benefits and other options for treatment, the patient has consented to  Procedure(s) with comments: DRAINAGE OF RIGHT CHEST WALL SEROMA SOFT TISSUES OF CHEST (Right) - LMA as a surgical intervention.  The patient's history has been reviewed, patient examined, no change in status, stable for surgery.  I have reviewed the patient's chart and labs.  Questions were answered to the patient's satisfaction.     Cheryl Knight

## 2022-12-13 NOTE — Anesthesia Postprocedure Evaluation (Signed)
Anesthesia Post Note  Patient: TAYELOR FABIEN  Procedure(s) Performed: DRAINAGE OF RIGHT CHEST WALL SEROMA SOFT TISSUES OF CHEST (Right: Breast)     Patient location during evaluation: PACU Anesthesia Type: General Level of consciousness: awake Pain management: pain level controlled Vital Signs Assessment: post-procedure vital signs reviewed and stable Respiratory status: spontaneous breathing, nonlabored ventilation and respiratory function stable Cardiovascular status: blood pressure returned to baseline and stable Postop Assessment: no apparent nausea or vomiting Anesthetic complications: no   There were no known notable events for this encounter.  Last Vitals:  Vitals:   12/13/22 1305 12/13/22 1312  BP:  127/81  Pulse: 75 78  Resp: 11 16  Temp:  36.6 C  SpO2: 95% 96%    Last Pain:  Vitals:   12/13/22 1312  TempSrc:   PainSc: 0-No pain                 Linton Rump

## 2022-12-13 NOTE — Anesthesia Procedure Notes (Signed)
Procedure Name: LMA Insertion Date/Time: 12/13/2022 12:23 PM  Performed by: Allyn Kenner, CRNAPre-anesthesia Checklist: Patient identified, Emergency Drugs available, Suction available and Patient being monitored Patient Re-evaluated:Patient Re-evaluated prior to induction Oxygen Delivery Method: Circle System Utilized Preoxygenation: Pre-oxygenation with 100% oxygen Induction Type: IV induction Ventilation: Mask ventilation without difficulty LMA: LMA inserted LMA Size: 4.0 Number of attempts: 1 Airway Equipment and Method: Bite block Placement Confirmation: positive ETCO2 Tube secured with: Tape Dental Injury: Teeth and Oropharynx as per pre-operative assessment

## 2022-12-13 NOTE — Op Note (Signed)
Pre-op diagnosis: Persistent right chest seroma Postop diagnosis: Same Procedure performed: Evacuation of right chest seroma with placement of Penrose drain Surgeon:Roxas Clymer K Ian Cavey Anesthesia: General Indications: This is a 67 year old female who was diagnosed with right breast cancer in 2021.  She underwent lumpectomy, sentinel lymph node biopsy, and radiation.  Recently, she was found to have a recurrence in the right breast with ductal carcinoma in situ.  She opted for a right mastectomy.  The patient has had a persistent seroma in the right chest and has undergone multiple aspirations.  She is likely reaccumulating the seromas due to the previous radiation to the right chest.  We made the decision to return to the operating room to evacuate the entire seroma and to place a drain.  Description of procedure: The patient is brought to the operating room placed in the supine position on the operating room table.  After an adequate level of general anesthesia was obtained, her right chest was prepped with ChloraPrep and draped in sterile fashion.  A timeout was taken to ensure the proper patient and proper procedure.  We infiltrated the area around the most lateral 3 cm of the right mastectomy incision with local anesthetic.  I opened the incision with a scalpel.  We dissected into the seroma cavity.  We evacuated approximately 800 cc of clear yellow seroma fluid.  We inserted the suction cannula and evacuated all of the fluid as possible.  We then placed 1/2 inch Penrose drain through this incision and laid it across the chest wall to the medial part of the seroma cavity.  This was secured with a 2-0 Ethilon suture.  A dry dressing is applied.  A breast binder is applied.  The patient is then extubated and brought to the recovery room in stable condition.  All sponge, instrument, and needle counts are correct.  Wilmon Arms. Corliss Skains, MD, [facility]  General Surgery   12/13/2022 12:45  PM

## 2022-12-13 NOTE — Discharge Instructions (Addendum)
Dry gauze and tape over the drain site.  Change as frequently as needed to absorb the drainage.  You may shower with the dressing in place.  Change the wet dressing after each shower.  Wear the abdominal binder or some kind of compression over the right chest is much as possible.   Post Anesthesia Home Care Instructions  Activity: Get plenty of rest for the remainder of the day. A responsible individual must stay with you for 24 hours following the procedure.  For the next 24 hours, DO NOT: -Drive a car -Advertising copywriter -Drink alcoholic beverages -Take any medication unless instructed by your physician -Make any legal decisions or sign important papers.  Meals: Start with liquid foods such as gelatin or soup. Progress to regular foods as tolerated. Avoid greasy, spicy, heavy foods. If nausea and/or vomiting occur, drink only clear liquids until the nausea and/or vomiting subsides. Call your physician if vomiting continues.  Special Instructions/Symptoms: Your throat may feel dry or sore from the anesthesia or the breathing tube placed in your throat during surgery. If this causes discomfort, gargle with warm salt water. The discomfort should disappear within 24 hours.  If you had a scopolamine patch placed behind your ear for the management of post- operative nausea and/or vomiting:  1. The medication in the patch is effective for 72 hours, after which it should be removed.  Wrap patch in a tissue and discard in the trash. Wash hands thoroughly with soap and water. 2. You may remove the patch earlier than 72 hours if you experience unpleasant side effects which may include dry mouth, dizziness or visual disturbances. 3. Avoid touching the patch. Wash your hands with soap and water after contact with the patch.    *May take Tylenol today after 5:30pm

## 2022-12-17 ENCOUNTER — Encounter: Payer: Self-pay | Admitting: Family Medicine

## 2022-12-19 NOTE — Progress Notes (Unsigned)
   SUBJECTIVE:  No chief complaint on file.  HPI Presents for follow up chronic diarrhea.  Stopped Metformin Started Jardiance Diarrhea improved/did not improve Recent blood work normal  Last colonoscopy 2021 Dr. Norma Fredrickson tubular adenoma 5 yr f/u, egd mild eos/neg barret   Needs POC A1c  Recent drainage of right breast seroma s/p right mastectomy Doing well Follow up with Dr Corliss Skains  PERTINENT PMH / PSH: ***  OBJECTIVE:  There were no vitals taken for this visit.   Physical Exam     11/22/2022    8:07 AM 11/02/2022   10:31 AM 02/18/2022    9:51 AM 10/29/2021   11:00 AM 10/26/2021    9:38 AM  Depression screen PHQ 2/9  Decreased Interest 0 0 0 0 0  Down, Depressed, Hopeless 0 0 0 0 0  PHQ - 2 Score 0 0 0 0 0  Altered sleeping 0 0 0    Tired, decreased energy 1 1 1     Change in appetite 3 3 0    Feeling bad or failure about yourself  0 0 0    Trouble concentrating 0 0 0    Moving slowly or fidgety/restless 0 0 0    Suicidal thoughts 0 0 0    PHQ-9 Score 4 4 1     Difficult doing work/chores Not difficult at all Not difficult at all Not difficult at all        11/22/2022    8:08 AM 02/18/2022    9:51 AM 08/28/2019    1:34 PM 08/16/2019    1:10 PM  GAD 7 : Generalized Anxiety Score  Nervous, Anxious, on Edge 0 0 0 0  Control/stop worrying 0 0 0 0  Worry too much - different things 0 0 0 0  Trouble relaxing 0 0 0 0  Restless 0 0 0 0  Easily annoyed or irritable 0 0 0 0  Afraid - awful might happen 0 0 0 0  Total GAD 7 Score 0 0 0 0  Anxiety Difficulty Not difficult at all Not difficult at all Not difficult at all Not difficult at all    ASSESSMENT/PLAN:  There are no diagnoses linked to this encounter. PDMP reviewed***  No follow-ups on file.  Dana Allan, MD

## 2022-12-19 NOTE — Patient Instructions (Incomplete)
It was a pleasure meeting you today. Thank you for allowing me to take part in your health care.  Our goals for today as we discussed include:  Will order ultrasound to check for stones Take ibuprofen 600 mg every 8 hours for pain as needed  Will get some labs today and notify your results.  4 weeks If you have any questions or concerns, please do not hesitate to call the office at [phone].  I look forward to our next visit and until then take care and stay safe.  Regards,   Janyth Riera, MD   [place]  

## 2022-12-20 ENCOUNTER — Encounter: Payer: Self-pay | Admitting: Family Medicine

## 2022-12-20 ENCOUNTER — Ambulatory Visit (INDEPENDENT_AMBULATORY_CARE_PROVIDER_SITE_OTHER): Payer: Medicare HMO | Admitting: Family Medicine

## 2022-12-20 VITALS — BP 116/70 | HR 83 | Temp 98.0°F | Resp 16 | Ht 68.0 in | Wt 196.1 lb

## 2022-12-20 DIAGNOSIS — R197 Diarrhea, unspecified: Secondary | ICD-10-CM | POA: Diagnosis not present

## 2022-12-20 DIAGNOSIS — I152 Hypertension secondary to endocrine disorders: Secondary | ICD-10-CM

## 2022-12-20 DIAGNOSIS — G43709 Chronic migraine without aura, not intractable, without status migrainosus: Secondary | ICD-10-CM | POA: Diagnosis not present

## 2022-12-20 DIAGNOSIS — N6489 Other specified disorders of breast: Secondary | ICD-10-CM | POA: Diagnosis not present

## 2022-12-20 DIAGNOSIS — Z7984 Long term (current) use of oral hypoglycemic drugs: Secondary | ICD-10-CM

## 2022-12-20 DIAGNOSIS — E118 Type 2 diabetes mellitus with unspecified complications: Secondary | ICD-10-CM | POA: Diagnosis not present

## 2022-12-20 DIAGNOSIS — E1159 Type 2 diabetes mellitus with other circulatory complications: Secondary | ICD-10-CM | POA: Diagnosis not present

## 2022-12-20 DIAGNOSIS — E039 Hypothyroidism, unspecified: Secondary | ICD-10-CM

## 2022-12-20 LAB — POCT GLYCOSYLATED HEMOGLOBIN (HGB A1C): Hemoglobin A1C: 6.3 % — AB (ref 4.0–5.6)

## 2022-12-20 MED ORDER — OLMESARTAN MEDOXOMIL-HCTZ 20-12.5 MG PO TABS: 1.0000 | ORAL_TABLET | Freq: Every morning | ORAL | 3 refills | Status: DC

## 2022-12-20 MED ORDER — LEVOTHYROXINE SODIUM 100 MCG PO TABS: ORAL_TABLET | ORAL | 3 refills | Status: DC

## 2022-12-20 MED ORDER — TOPIRAMATE 100 MG PO TABS: ORAL_TABLET | ORAL | 3 refills | Status: DC

## 2022-12-20 MED ORDER — AMLODIPINE BESYLATE 2.5 MG PO TABS: ORAL_TABLET | ORAL | 3 refills | Status: DC

## 2022-12-20 NOTE — Assessment & Plan Note (Signed)
Refill Topomax

## 2022-12-20 NOTE — Assessment & Plan Note (Signed)
Recent surgical intervention, 01/03/23 to place a drain due to persistent seroma following right mastectomy in August. Patient reports minimal drainage, with increased output during showers. Follow-up appointment scheduled with surgeon in two weeks. -Continue current management and follow-up with surgeon as planned.

## 2022-12-20 NOTE — Assessment & Plan Note (Signed)
Improved glycemic control with A1c of 6.3, down from previous. Patient reports increased dietary intake due to holidays, but blood glucose levels remain relatively controlled with an average of 140. Currently on Jardiance, with cessation of Metformin due to diarrhea. -Continue Jardiance. -Check A1c in 6 months, or sooner if blood glucose levels trend upwards.

## 2022-12-20 NOTE — Assessment & Plan Note (Signed)
Refill Levothyroxine

## 2022-12-20 NOTE — Assessment & Plan Note (Signed)
Refill Amlodipine and Benicar/HCT

## 2022-12-20 NOTE — Assessment & Plan Note (Signed)
Resolved with discontinuation of Metformin

## 2022-12-24 ENCOUNTER — Ambulatory Visit: Payer: Medicare HMO | Admitting: Physician Assistant

## 2022-12-24 DIAGNOSIS — Z79899 Other long term (current) drug therapy: Secondary | ICD-10-CM

## 2022-12-24 DIAGNOSIS — L409 Psoriasis, unspecified: Secondary | ICD-10-CM

## 2022-12-24 DIAGNOSIS — Z8669 Personal history of other diseases of the nervous system and sense organs: Secondary | ICD-10-CM

## 2022-12-24 DIAGNOSIS — M3500 Sicca syndrome, unspecified: Secondary | ICD-10-CM

## 2022-12-24 DIAGNOSIS — J849 Interstitial pulmonary disease, unspecified: Secondary | ICD-10-CM

## 2022-12-24 DIAGNOSIS — Z170 Estrogen receptor positive status [ER+]: Secondary | ICD-10-CM

## 2022-12-24 DIAGNOSIS — Z8639 Personal history of other endocrine, nutritional and metabolic disease: Secondary | ICD-10-CM

## 2022-12-24 DIAGNOSIS — M170 Bilateral primary osteoarthritis of knee: Secondary | ICD-10-CM

## 2022-12-24 DIAGNOSIS — M0579 Rheumatoid arthritis with rheumatoid factor of multiple sites without organ or systems involvement: Secondary | ICD-10-CM

## 2022-12-24 DIAGNOSIS — M19071 Primary osteoarthritis, right ankle and foot: Secondary | ICD-10-CM

## 2022-12-24 DIAGNOSIS — Z8719 Personal history of other diseases of the digestive system: Secondary | ICD-10-CM

## 2022-12-24 DIAGNOSIS — Z8709 Personal history of other diseases of the respiratory system: Secondary | ICD-10-CM

## 2022-12-28 NOTE — Progress Notes (Unsigned)
Office Visit Note  Patient: Cheryl Knight             Date of Birth: [birth year]           MRN: [id number]             PCP: Dana Allan, MD Referring: Dana Allan, MD Visit Date: 12/30/2022 Occupation: @GUAROCC @  Subjective:    History of Present Illness: Cheryl Knight is a 66 y.o. female with history of seropositive rheumatoid arthritis and ILD.  Patient remains on Arava 20 mg 1 tablet by mouth daily.   CMP updated on 12/06/22. CBC updated on 11/18/22.   Her next lab work will be due at the end of January and every 3 months.   Discussed the importance of holding arava if she develops signs or symptoms of an infection and to resume once the infection has completley cleared.   Activities of Daily Living:  Patient reports morning stiffness for *** {minute/hour:19697}.   Patient {ACTIONS;DENIES/REPORTS:21021675::"Denies"} nocturnal pain.  Difficulty dressing/grooming: {ACTIONS;DENIES/REPORTS:21021675::"Denies"} Difficulty climbing stairs: {ACTIONS;DENIES/REPORTS:21021675::"Denies"} Difficulty getting out of chair: {ACTIONS;DENIES/REPORTS:21021675::"Denies"} Difficulty using hands for taps, buttons, cutlery, and/or writing: {ACTIONS;DENIES/REPORTS:21021675::"Denies"}  No Rheumatology ROS completed.   PMFS History:  Patient Active Problem List   Diagnosis Date Noted   Chronic migraine w/o aura w/o status migrainosus, not intractable 12/20/2022   Seroma of breast 11/09/2022   Diarrhea 11/09/2022   Need for influenza vaccination 11/09/2022   Type 2 diabetes with complication (HCC) 11/09/2022   Invasive ductal carcinoma of breast, female, right (HCC) 11/03/2022   Ductal carcinoma in situ (DCIS) of right breast 08/26/2022   ILD (interstitial lung disease) (HCC) 07/05/2022   Chronic back pain 11/07/2021   Fatty liver 06/24/2020   Pulmonary fibrosis (HCC) 10/01/2019   Pulmonary nodules 10/01/2019   Bronchiectasis without complication (HCC) 10/01/2019   Aortic  atherosclerosis (HCC) 10/01/2019   Hypertension associated with diabetes (HCC) 10/01/2019   Paresthesia of upper extremity 10/01/2019   Hiatal hernia 08/16/2019   Dry eye syndrome 08/05/2019   Glaucoma 08/05/2019   Hypertensive retinopathy 08/05/2019   Arteriosclerosis 08/05/2019   Pterygium 08/05/2019   Ocular rosacea 08/05/2019   Malignant neoplasm of lower-outer quadrant of right breast of female, estrogen receptor positive (HCC) 03/19/2019   Allergic rhinitis 02/08/2019   Osteoarthritis    Obesity (BMI 30-39.9) 12/30/2015   Basal cell carcinoma of chest wall 10/05/2011   Hyperlipidemia 02/27/2008   Hypothyroidism 02/21/2007   Migraine 02/21/2007   VAGINITIS, ATROPHIC 02/21/2007   Psoriasis 02/21/2007   Sicca syndrome, unspecified 02/21/2007   GERD 06/29/2006   Rheumatoid arthritis (HCC) 06/29/2006    Past Medical History:  Diagnosis Date   Cancer (HCC) 02/2019   right breast IDC   Cataracts, bilateral    tbd surgery 2021   Complication of anesthesia    Cough    Diabetes mellitus without complication (HCC)    Difficult intubation    TRACH AT 19   Dry eye syndrome    Dyspnea    W/ COUGHING- was due to lisinopril and septic gallbladder   Family history of adverse reaction to anesthesia    sister coded with propofol use   Family history of breast cancer    Family history of lung cancer    Family history of prostate cancer    Family history of skin cancer    GERD (gastroesophageal reflux disease)    Glaucoma    stable glaucoma suspect 01/19/21 [facility]   Headache    High cholesterol  History of hiatal hernia    History of pneumonia    walking pneumonia "years ago"   Hypertension    Hypothyroidism    Irritable bowel    Osteoarthritis    i.e right knee    PONV (postoperative nausea and vomiting)    Pre-diabetes    Retinopathy    htn 01/19/21 [facility]   Rheumatoid arthritis (HCC)    Squamous cell carcinoma of skin of chest    removed   Thyroid disease      Family History  Problem Relation Age of Onset   Hypertension Mother    Thyroid disease Mother    Breast cancer Mother 8       breast dx age 33 died age 78    Prostate cancer Father 67       metastatic   Stroke Maternal Grandmother    Skin cancer Maternal Uncle        dx. in his 74s   Lung cancer Maternal Uncle        smoker, died age 71   Past Surgical History:  Procedure Laterality Date   ABDOMINAL HYSTERECTOMY     age 86 ovaries out   BREAST LUMPECTOMY WITH RADIOACTIVE SEED AND SENTINEL LYMPH NODE BIOPSY Right 03/28/2019   Procedure: RIGHT BREAST LUMPECTOMY WITH RADIOACTIVE SEED AND SENTINEL LYMPH NODE BIOPSY;  Surgeon: Manus Rudd, MD;  Location: [facility];  Service: General;  Laterality: Right;  PEC BLOCK FOR POST OP PAIN   BREAST SURGERY     CATARACT EXTRACTION, BILATERAL     CHOLECYSTECTOMY     10/2019   COLONOSCOPY     x2   ESOPHAGEAL DILATION     Dr. 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   Physical Exam Vitals and nursing note reviewed.  Constitutional:      Appearance: She is well-developed.  HENT:     Head: Normocephalic and atraumatic.  Eyes:     Conjunctiva/sclera: Conjunctivae normal.  Cardiovascular:     Rate and Rhythm: Normal rate and regular rhythm.     Heart sounds: Normal heart sounds.  Pulmonary:     Effort: Pulmonary effort is normal.     Breath sounds: Normal breath sounds.  Abdominal:     General: Bowel sounds are normal.     Palpations: Abdomen is soft.  Musculoskeletal:  Cervical back: Normal range of motion.  Lymphadenopathy:     Cervical: No cervical adenopathy.  Skin:    General: Skin is warm and dry.     Capillary Refill: Capillary refill takes less than 2 seconds.  Neurological:     Mental Status: She is alert and oriented to person, place, and time.  Psychiatric:        Behavior: Behavior normal.      Musculoskeletal Exam: ***  CDAI Exam: CDAI Score: -- Patient Global: --; Provider Global: -- Swollen: --; Tender: -- Joint Exam 12/30/2022   No joint exam has been documented for this visit   There is currently no information documented on the homunculus. Go to the Rheumatology activity and complete the homunculus joint exam.  Investigation: No additional findings.  Imaging: No results found.  Recent Labs: Lab Results  Component Value Date   WBC 4.7 11/18/2022   HGB 13.3 11/18/2022   PLT 275.0 11/18/2022   NA 142 12/06/2022   K 4.0 12/06/2022   CL 106 12/06/2022   CO2 27 12/06/2022   GLUCOSE 122 (H) 12/06/2022   BUN 14 12/06/2022   CREATININE 0.65 12/06/2022   BILITOT 0.3 12/06/2022   ALKPHOS 49 12/06/2022   AST 18 12/06/2022   ALT 11 12/06/2022   PROT 6.6 12/06/2022   ALBUMIN 4.1 12/06/2022   CALCIUM 9.4 12/06/2022   GFRAA 108 04/22/2020   QFTBGOLDPLUS NEGATIVE 04/05/2019    Speciality Comments: No specialty comments  available.  Procedures:  No procedures performed Allergies: Pentazocine lactate, Tape, Demerol [meperidine], and Lisinopril   Assessment / Plan:     Visit Diagnoses: Rheumatoid arthritis involving multiple sites with positive rheumatoid factor (HCC)  High risk medication use  ILD (interstitial lung disease) (HCC)  History of asthma  Sicca syndrome (HCC)  Primary osteoarthritis of both knees  Primary osteoarthritis of both feet  Psoriasis  History of hyperlipidemia  Functional diarrhea  History of hypothyroidism  Malignant neoplasm of lower-outer quadrant of right breast of female, estrogen receptor positive (HCC)  History of gastroesophageal reflux (GERD)  History of migraine  Orders: No orders of the defined types were placed in this encounter.  No orders of the defined types were placed in this encounter.   Face-to-face time spent with patient was *** minutes. Greater than 50% of time was spent in counseling and coordination of care.  Follow-Up Instructions: No follow-ups on file.   Gearldine Bienenstock, PA-C  Note - This record has been created using Dragon software.  Chart creation errors have been sought, but may not always  have been located. Such creation errors do not reflect on  the standard of medical care.

## 2022-12-30 ENCOUNTER — Telehealth: Payer: Self-pay | Admitting: Rheumatology

## 2022-12-30 ENCOUNTER — Ambulatory Visit: Payer: Medicare HMO | Admitting: Physician Assistant

## 2022-12-30 DIAGNOSIS — M3500 Sicca syndrome, unspecified: Secondary | ICD-10-CM

## 2022-12-30 DIAGNOSIS — L409 Psoriasis, unspecified: Secondary | ICD-10-CM

## 2022-12-30 DIAGNOSIS — M0579 Rheumatoid arthritis with rheumatoid factor of multiple sites without organ or systems involvement: Secondary | ICD-10-CM

## 2022-12-30 DIAGNOSIS — Z8709 Personal history of other diseases of the respiratory system: Secondary | ICD-10-CM

## 2022-12-30 DIAGNOSIS — Z170 Estrogen receptor positive status [ER+]: Secondary | ICD-10-CM

## 2022-12-30 DIAGNOSIS — Z79899 Other long term (current) drug therapy: Secondary | ICD-10-CM

## 2022-12-30 DIAGNOSIS — M19071 Primary osteoarthritis, right ankle and foot: Secondary | ICD-10-CM

## 2022-12-30 DIAGNOSIS — M170 Bilateral primary osteoarthritis of knee: Secondary | ICD-10-CM

## 2022-12-30 DIAGNOSIS — Z8669 Personal history of other diseases of the nervous system and sense organs: Secondary | ICD-10-CM

## 2022-12-30 DIAGNOSIS — J849 Interstitial pulmonary disease, unspecified: Secondary | ICD-10-CM

## 2022-12-30 DIAGNOSIS — K591 Functional diarrhea: Secondary | ICD-10-CM

## 2022-12-30 DIAGNOSIS — Z8639 Personal history of other endocrine, nutritional and metabolic disease: Secondary | ICD-10-CM

## 2022-12-30 DIAGNOSIS — Z8719 Personal history of other diseases of the digestive system: Secondary | ICD-10-CM

## 2022-12-30 NOTE — Telephone Encounter (Signed)
FYI:  Patient called stating she missed her 7:50 am appointment today with Ladona Ridgel due to flu like symptoms.  Patient states she has been throwing up all morning and will call back to reschedule.

## 2022-12-30 NOTE — Telephone Encounter (Signed)
Noted  

## 2022-12-31 ENCOUNTER — Other Ambulatory Visit: Payer: Self-pay | Admitting: Family Medicine

## 2022-12-31 MED ORDER — RIZATRIPTAN BENZOATE 10 MG PO TABS: 10.0000 mg | ORAL_TABLET | ORAL | 1 refills | Status: DC | PRN

## 2023-01-20 NOTE — Progress Notes (Signed)
 Office Visit Note  Patient: Cheryl Knight             Date of Birth: Dec 16, 1955           MRN: [id number]             PCP: Hope Merle, MD Referring: Hope Merle, MD Visit Date: 01/24/2023 Occupation: @GUAROCC @  Subjective:  Medication monitoring   History of Present Illness: Cheryl Knight is a 68 y.o. female with history of seropositive rheumatoid arthritis.  Patient remains on arava  20 mg 1 tablet by mouth daily.  She has been tolerating Arava  without any side effects and has not had any recent gaps in therapy.  She denies any signs or symptoms of a rheumatoid arthritis flare.  She experiences aching and joint stiffness which she attributes to cooler weather temperatures but overall her symptoms have been manageable.  She has not noticed any joint swelling. She denies any new or worsening pulmonary symptoms.  She was evaluated by pulmonology on 10/27/2022, and patient reports that she was advised to follow-up with pulmonology on a yearly basis. Patient underwent a right breast mastectomy on 09/14/2022.  Patient states that she recently had to have a drain replaced for a leaking seroma.  She has an upcoming appointment with her surgeon Monday and oncology on 03/15/23.     Activities of Daily Living:  Patient reports morning stiffness for 2 minutes.   Patient Denies nocturnal pain.  Difficulty dressing/grooming: Reports Difficulty climbing stairs: Reports Difficulty getting out of chair: Reports Difficulty using hands for taps, buttons, cutlery, and/or writing: Reports  Review of Systems  Constitutional:  Positive for fatigue.  HENT:  Negative for mouth sores and mouth dryness.   Eyes:  Positive for dryness.  Respiratory:  Negative for shortness of breath.   Cardiovascular:  Negative for chest pain and palpitations.  Gastrointestinal:  Positive for diarrhea. Negative for blood in stool and constipation.  Endocrine: Positive for increased urination.  Genitourinary:   Negative for involuntary urination.  Musculoskeletal:  Positive for joint pain, joint pain and morning stiffness. Negative for gait problem, joint swelling, myalgias, muscle weakness, muscle tenderness and myalgias.  Skin:  Negative for color change, rash, hair loss and sensitivity to sunlight.  Allergic/Immunologic: Negative for susceptible to infections.  Neurological:  Positive for headaches. Negative for dizziness.  Hematological:  Negative for swollen glands.  Psychiatric/Behavioral:  Negative for depressed mood and sleep disturbance. The patient is not nervous/anxious.     PMFS History:  Patient Active Problem List   Diagnosis Date Noted   Chronic migraine w/o aura w/o status migrainosus, not intractable 12/20/2022   Seroma of breast 11/09/2022   Diarrhea 11/09/2022   Need for influenza vaccination 11/09/2022   Type 2 diabetes with complication (HCC) 11/09/2022   Invasive ductal carcinoma of breast, female, right (HCC) 11/03/2022   Ductal carcinoma in situ (DCIS) of right breast 08/26/2022   ILD (interstitial lung disease) (HCC) 07/05/2022   Chronic back pain 11/07/2021   Fatty liver 06/24/2020   Pulmonary fibrosis (HCC) 10/01/2019   Pulmonary nodules 10/01/2019   Bronchiectasis without complication (HCC) 10/01/2019   Aortic atherosclerosis (HCC) 10/01/2019   Hypertension associated with diabetes (HCC) 10/01/2019   Paresthesia of upper extremity 10/01/2019   Hiatal hernia 08/16/2019   Dry eye syndrome 08/05/2019   Glaucoma 08/05/2019   Hypertensive retinopathy 08/05/2019   Arteriosclerosis 08/05/2019   Pterygium 08/05/2019   Ocular rosacea 08/05/2019   Malignant neoplasm of lower-outer quadrant of right breast  of female, estrogen receptor positive (HCC) 03/19/2019   Allergic rhinitis 02/08/2019   Osteoarthritis    Obesity (BMI 30-39.9) 12/30/2015   Basal cell carcinoma of chest wall 10/05/2011   Hyperlipidemia 02/27/2008   Hypothyroidism 02/21/2007   Migraine  02/21/2007   VAGINITIS, ATROPHIC 02/21/2007   Psoriasis 02/21/2007   Sicca syndrome, unspecified 02/21/2007   GERD 06/29/2006   Rheumatoid arthritis (HCC) 06/29/2006    Past Medical History:  Diagnosis Date   Cancer (HCC) 02/2019   right breast IDC   Cataracts, bilateral    tbd surgery 2021   Complication of anesthesia    Cough    Diabetes mellitus without complication (HCC)    Difficult intubation    TRACH AT 19   Dry eye syndrome    Dyspnea    W/ COUGHING- was due to lisinopril  and septic gallbladder   Family history of adverse reaction to anesthesia    sister coded with propofol  use   Family history of breast cancer    Family history of lung cancer    Family history of prostate cancer    Family history of skin cancer    GERD (gastroesophageal reflux disease)    Glaucoma    stable glaucoma suspect 01/19/21 [facility]   Headache    High cholesterol    History of hiatal hernia    History of pneumonia    walking pneumonia years ago   Hypertension    Hypothyroidism    Irritable bowel    Osteoarthritis    i.e right knee    PONV (postoperative nausea and vomiting)    Pre-diabetes    Retinopathy    htn 01/19/21 [facility]   Rheumatoid arthritis (HCC)    Squamous cell carcinoma of skin of chest    removed   Thyroid  disease     Family History  Problem Relation Age of Onset   Hypertension Mother    Thyroid  disease Mother    Breast cancer Mother 4       breast dx age 71 died age 75    Prostate cancer Father 62       metastatic   Stroke Maternal Grandmother    Skin cancer Maternal Uncle        dx. in his 14s   Lung cancer Maternal Uncle        smoker, died age 49   Past Surgical History:  Procedure Laterality Date   ABDOMINAL HYSTERECTOMY     age 34 ovaries out   BREAST LUMPECTOMY WITH RADIOACTIVE SEED AND SENTINEL LYMPH NODE BIOPSY Right 03/28/2019   Procedure: RIGHT BREAST LUMPECTOMY WITH RADIOACTIVE SEED AND SENTINEL LYMPH NODE BIOPSY;  Surgeon: Belinda Cough, MD;  Location: [facility];  Service: General;  Laterality: Right;  PEC BLOCK FOR POST OP PAIN   BREAST SURGERY     CATARACT EXTRACTION, BILATERAL     CHOLECYSTECTOMY     10/2019   COLONOSCOPY     x2   ESOPHAGEAL DILATION     Dr. 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    Immunization History  Administered Date(s) Administered   Fluad Quad(high Dose 65+) 03/11/2020, 10/29/2021   Fluad Trivalent(High Dose 65+) 11/08/2022   H1N1 02/27/2008   Influenza-Unspecified 11/18/2017   PFIZER(Purple Top)SARS-COV-2 Vaccination 03/02/2019, 03/29/2019, 01/01/2020   PNEUMOCOCCAL CONJUGATE-20 07/22/2021   Pfizer Covid-19 Vaccine Bivalent Booster 60yrs & up 02/02/2021   Pneumococcal Conjugate-13 10/03/2019   Td 01/19/2004   Tdap 12/17/2014     Objective: Vital Signs: BP 115/76 (BP Location: Left Arm, Patient Position: Sitting)   Pulse (!) 101   Resp 16   Ht 5' 8 (1.727 m)   Wt 202 lb (91.6 kg)   BMI 30.71 kg/m    Physical Exam Vitals and nursing note reviewed.  Constitutional:      Appearance: She is well-developed.  HENT:     Head: Normocephalic and atraumatic.  Eyes:     Conjunctiva/sclera: Conjunctivae normal.  Cardiovascular:     Rate and Rhythm: Normal rate and regular rhythm.     Heart sounds: Normal heart sounds.  Pulmonary:     Effort: Pulmonary effort is normal.     Breath sounds: Normal breath sounds.  Abdominal:     General: Bowel sounds are normal.      Palpations: Abdomen is soft.  Musculoskeletal:     Cervical back: Normal range of motion.  Lymphadenopathy:     Cervical: No cervical adenopathy.  Skin:    General: Skin is warm and dry.     Capillary Refill: Capillary refill takes less than 2 seconds.  Neurological:     Mental Status: She is alert and oriented to person, place, and time.  Psychiatric:        Behavior: Behavior normal.      Musculoskeletal Exam: C-spine, thoracic spine, lumbar spine and good range of motion.  Shoulder joints, elbow joints, wrist joints, MCPs, PIPs, DIPs have good range of motion with no synovitis.  PIP and DIP thickening consistent with osteoarthritis of both hands.  Hip joints have good range of motion with no groin pain.  Discomfort range of motion of both knees but no warmth or effusion noted.  Ankle joints have good range of motion with no tenderness or joint swelling.  Some pedal edema noted bilaterally.  CDAI Exam: CDAI Score: -- Patient Global: --; Provider Global: -- Swollen: --; Tender: -- Joint Exam 01/24/2023   No joint exam has been documented for this visit   There is currently no information documented on the homunculus. Go to the Rheumatology activity and complete the homunculus joint exam.  Investigation: No additional findings.  Imaging: No results found.  Recent Labs: Lab Results  Component Value Date   WBC 4.7 11/18/2022   HGB 13.3 11/18/2022   PLT 275.0 11/18/2022   NA 142 12/06/2022   K 4.0 12/06/2022   CL 106 12/06/2022   CO2 27 12/06/2022   GLUCOSE 122 (H) 12/06/2022   BUN 14 12/06/2022   CREATININE 0.65 12/06/2022   BILITOT 0.3 12/06/2022   ALKPHOS 49 12/06/2022   AST 18 12/06/2022   ALT 11 12/06/2022   PROT 6.6 12/06/2022   ALBUMIN 4.1 12/06/2022   CALCIUM  9.4 12/06/2022   GFRAA 108 04/22/2020   QFTBGOLDPLUS NEGATIVE 04/05/2019    Speciality Comments: No specialty comments available.  Procedures:  No procedures performed  Allergies: Pentazocine  lactate, Tape, Demerol [meperidine], and Lisinopril     Assessment / Plan:     Visit Diagnoses: Rheumatoid arthritis involving multiple sites with positive rheumatoid factor (HCC) - +RF: She has no synovitis on examination today.  She continues to experience intermittent arthralgias and joint stiffness especially with weather changes.  She remains on Arava  20 mg 1 tablet by mouth daily.  She continues to tolerate Arava  without any side effects and has not had any recent gaps in therapy.  Overall her symptoms have been manageable and she does not want to make any medication changes.  She is having some increased discomfort in both knees which is exacerbated by climbing up and down steps.  Patient was advised to notify us  if she develops any new or worsening symptoms. She will remain on arava  as prescribed.  She will follow-up in the office in 5 months or sooner if needed.  High risk medication use - Arava  20 mg 1 tablet by mouth daily.  (Previously on Methotrexate  0.8 ml every 7 days and folic acid  1 mg 2 tablets daily.) CBC 11/18/22. CMP updated on 12/06/22.   She will require updated lab work at the end of January/early February 2025 and every 3 months.   Discussed the importance of holding arava  if she develops signs or symptoms of an infection and to resume once the infection has completely cleared.    ILD (interstitial lung disease) (HCC): No new or worsening pulmonary symptoms.   Mild pulmonary fibrosis in an early indeterminate for UIP pattern.  Diagnosed on the basis of high-resolution CT. On 09/26/19. High-resolution chest CT updated on 07/29/2022: Minimal subpleural reticular densities and traction bronchiolectasis in the lateral aspects of the mid and lower lung zones, grossly stable from 09/26/2019. Findings may be due to nonspecific interstitial pneumonitis Patient was evaluated by pulmonology Dr. Tamea on 10/27/2022-reviewed OV note.  No evidence of progression.  Recommended yearly PFT.    History of asthma - Diagnosed with asthma by Dr. Tamea.  Sicca syndrome [facility]): Chronic dry eyes  Primary osteoarthritis of both knees: She continues to experience discomfort involving both knees especially when climbing up and down steps.  On examination today she has some discomfort with range of motion but no warmth or effusion noted.  She is advised to notify us  if her symptoms persist or worsen.  Primary osteoarthritis of both feet:  She is wearing proper fitting shoes.   Psoriasis: Not currently active.   Other medical conditions are listed as follows:   History of hyperlipidemia  History of hypothyroidism  Malignant neoplasm of lower-outer quadrant of right breast of female, estrogen receptor positive (HCC)  History of migraine  History of gastroesophageal reflux (GERD)  Orders: No orders of the defined types were placed in this encounter.  No orders of the defined types were placed in this encounter.    Follow-Up Instructions: Return in about 5 months (around 06/24/2023) for Rheumatoid arthritis.   Waddell CHRISTELLA Craze, PA-C  Note - This record has been created using Dragon software.  Chart creation errors have been sought, but may not always  have been located. Such creation errors do not reflect on  the standard of medical care.

## 2023-01-24 ENCOUNTER — Encounter: Payer: Self-pay | Admitting: Physician Assistant

## 2023-01-24 ENCOUNTER — Ambulatory Visit: Payer: PPO | Attending: Physician Assistant | Admitting: Physician Assistant

## 2023-01-24 VITALS — BP 115/76 | HR 101 | Resp 16 | Ht 68.0 in | Wt 202.0 lb

## 2023-01-24 DIAGNOSIS — M19072 Primary osteoarthritis, left ankle and foot: Secondary | ICD-10-CM

## 2023-01-24 DIAGNOSIS — Z79899 Other long term (current) drug therapy: Secondary | ICD-10-CM | POA: Diagnosis not present

## 2023-01-24 DIAGNOSIS — M0579 Rheumatoid arthritis with rheumatoid factor of multiple sites without organ or systems involvement: Secondary | ICD-10-CM | POA: Diagnosis not present

## 2023-01-24 DIAGNOSIS — L409 Psoriasis, unspecified: Secondary | ICD-10-CM

## 2023-01-24 DIAGNOSIS — J849 Interstitial pulmonary disease, unspecified: Secondary | ICD-10-CM | POA: Diagnosis not present

## 2023-01-24 DIAGNOSIS — M19071 Primary osteoarthritis, right ankle and foot: Secondary | ICD-10-CM

## 2023-01-24 DIAGNOSIS — C50511 Malignant neoplasm of lower-outer quadrant of right female breast: Secondary | ICD-10-CM

## 2023-01-24 DIAGNOSIS — Z8669 Personal history of other diseases of the nervous system and sense organs: Secondary | ICD-10-CM

## 2023-01-24 DIAGNOSIS — Z8719 Personal history of other diseases of the digestive system: Secondary | ICD-10-CM | POA: Diagnosis not present

## 2023-01-24 DIAGNOSIS — M3500 Sicca syndrome, unspecified: Secondary | ICD-10-CM | POA: Diagnosis not present

## 2023-01-24 DIAGNOSIS — M170 Bilateral primary osteoarthritis of knee: Secondary | ICD-10-CM

## 2023-01-24 DIAGNOSIS — Z8639 Personal history of other endocrine, nutritional and metabolic disease: Secondary | ICD-10-CM

## 2023-01-24 DIAGNOSIS — Z8709 Personal history of other diseases of the respiratory system: Secondary | ICD-10-CM

## 2023-01-24 DIAGNOSIS — Z170 Estrogen receptor positive status [ER+]: Secondary | ICD-10-CM

## 2023-01-24 NOTE — Patient Instructions (Signed)
 Standing Labs We placed an order today for your standing lab work.   Please have your standing labs drawn in late January/early February and every 3 months   Please have your labs drawn 2 weeks prior to your appointment so that the provider can discuss your lab results at your appointment, if possible.  Please note that you may see your imaging and lab results in MyChart before we have reviewed them. We will contact you once all results are reviewed. Please allow our office up to 72 hours to thoroughly review all of the results before contacting the office for clarification of your results.  WALK-IN LAB HOURS  Monday through Thursday from 8:00 am -12:30 pm and 1:00 pm-5:00 pm and Friday from 8:00 am-12:00 pm.  Patients with office visits requiring labs will be seen before walk-in labs.  You may encounter longer than normal wait times. Please allow additional time. Wait times may be shorter on  Monday and Thursday afternoons.  We do not book appointments for walk-in labs. We appreciate your patience and understanding with our staff.   Labs are drawn by [facility]. Please bring your co-pay at the time of your lab draw.  You may receive a bill from [facility] for your lab work.  Please note if you are on Hydroxychloroquine and and an order has been placed for a Hydroxychloroquine level,  you will need to have it drawn 4 hours or more after your last dose.  If you wish to have your labs drawn at another location, please call the office 24 hours in advance so we can fax the orders.  The office is located at [address], [place], [place] [address]   If you have any questions regarding directions or hours of operation,  please call [phone].   As a reminder, please drink plenty of water prior to coming for your lab work. Thanks!

## 2023-01-31 DIAGNOSIS — C50911 Malignant neoplasm of unspecified site of right female breast: Secondary | ICD-10-CM | POA: Diagnosis not present

## 2023-02-14 ENCOUNTER — Telehealth: Payer: Self-pay

## 2023-02-14 ENCOUNTER — Other Ambulatory Visit: Payer: Self-pay | Admitting: Family Medicine

## 2023-02-14 DIAGNOSIS — I152 Hypertension secondary to endocrine disorders: Secondary | ICD-10-CM

## 2023-02-14 NOTE — Telephone Encounter (Signed)
ATC pt X1. Lvm asking for a call back. I received a paper from [employer] in regards to Diabetes and Heart care plan. This should have been sent to the pt's pcp, Dr Dana Allan. Pt see's Luna Kitchens) Clent Ridges, NP for pulmonary. This was sent to the wrong provider due to similar last names. This paper need to go to the pt's pcp. PCP phone number in chart, I do not have a fax paper. Sending paper up front to have this sent back where it originally came from.

## 2023-02-14 NOTE — Telephone Encounter (Signed)
Copied from CRM [id number]. Topic: Clinical - Prescription Issue >> Feb 14, 2023 11:42 AM Fonda Kinder J wrote: Reason for CRM: Pt states  empagliflozin (JARDIANCE) 10 MG is giving her reoccurring yeast infections and they won't respond to treatments. She wants to know if there is a substitute or something else she can be prescribed in place of the [place] # [phone]

## 2023-02-21 ENCOUNTER — Encounter: Payer: Self-pay | Admitting: Family Medicine

## 2023-02-21 ENCOUNTER — Ambulatory Visit (INDEPENDENT_AMBULATORY_CARE_PROVIDER_SITE_OTHER): Payer: PPO | Admitting: Family Medicine

## 2023-02-21 VITALS — BP 100/68 | HR 94 | Resp 18 | Ht 68.0 in | Wt 196.5 lb

## 2023-02-21 DIAGNOSIS — Z7984 Long term (current) use of oral hypoglycemic drugs: Secondary | ICD-10-CM

## 2023-02-21 DIAGNOSIS — E118 Type 2 diabetes mellitus with unspecified complications: Secondary | ICD-10-CM

## 2023-02-21 DIAGNOSIS — B3749 Other urogenital candidiasis: Secondary | ICD-10-CM | POA: Diagnosis not present

## 2023-02-21 DIAGNOSIS — C50911 Malignant neoplasm of unspecified site of right female breast: Secondary | ICD-10-CM | POA: Diagnosis not present

## 2023-02-21 DIAGNOSIS — Z853 Personal history of malignant neoplasm of breast: Secondary | ICD-10-CM | POA: Diagnosis not present

## 2023-02-21 MED ORDER — FLUCONAZOLE 150 MG PO TABS: 150.0000 mg | ORAL_TABLET | Freq: Once | ORAL | 1 refills | Status: AC

## 2023-02-21 NOTE — Assessment & Plan Note (Signed)
Likely secondary to Jardiance use, which increases urinary glucose and predisposes to yeast infections. Symptoms have improved with over-the-counter treatments. No signs of active infection on examination today. -Prescribe Diflucan, one dose now and one dose to be taken in 3-4 days if symptoms persist. -Start daily probiotics. -If another yeast infection occurs, consider discontinuing Jardiance.

## 2023-02-21 NOTE — Patient Instructions (Addendum)
It was a pleasure meeting you today. Thank you for allowing me to take part in your health care.  Our goals for today as we discussed include:  Take Diflucan 150 mg for one dose. May use additional dose in 3 days if mo improvement in symptoms  Continue Jardiance daily.  If you have any further infections will discontinue and can start Rybelsus or Ozempic  Follow up in 4 weeks for repeat A1c and check kidneys.  Lab orders have been placed so can get before appointment.   This is a list of the screening recommended for you and due dates:  Health Maintenance  Topic Date Due   COVID-19 Vaccine (5 - 2024-25 season) 09/19/2022   Eye exam for diabetics  02/16/2023   Zoster (Shingles) Vaccine (1 of 2) 03/20/2023*   Hemoglobin A1C  06/20/2023   Medicare Annual Wellness Visit  11/02/2023   Yearly kidney health urinalysis for diabetes  11/03/2023   Complete foot exam   11/08/2023   Yearly kidney function blood test for diabetes  12/06/2023   Mammogram  08/04/2024   Colon Cancer Screening  08/13/2024   DTaP/Tdap/Td vaccine (3 - Td or Tdap) 12/16/2024   Pneumonia Vaccine  Completed   Flu Shot  Completed   DEXA scan (bone density measurement)  Completed   Hepatitis C Screening  Completed   HPV Vaccine  Aged Out  *Topic was postponed. The date shown is not the original due date.     If you have any questions or concerns, please do not hesitate to call the office at [phone].  I look forward to our next visit and until then take care and stay safe.  Regards,   Dana Allan, MD   [facility]

## 2023-02-21 NOTE — Progress Notes (Signed)
SUBJECTIVE:   Chief Complaint  Patient presents with   Medication Problem    Jardiance causing yeast infection   HPI Presents for acute visit  Discussed the use of AI scribe software for clinical note transcription with the patient, who gave verbal consent to proceed.  History of Present Illness The patient, with type 2 diabetes mellitus, presents with recurrent yeast infections.  She has been experiencing recurrent yeast infections since starting Jardiance a couple of months ago, with about three episodes characterized by vaginal discharge, burning, redness, and itching. She has been using an ointment, possibly clobetasol, for symptom relief, with some improvement, but continues to experience itching in the labia area.  Additionally, she reports itching and irritation under her breast and a flap of skin, which she has also been treating with the ointment, noting some improvement but persistent itching.  She has been on Jardiance for a couple of months and has noticed improvements in her blood sugar levels, which she monitors at home and describes as 'pretty good'. Her previous A1c was 6, with a history of an A1c of 11, indicating significant improvement in blood sugar control. She has used over-the-counter creams for vaginal symptoms, which help but are messy, and has previously taken Diflucan for yeast infections, which provided temporary relief.  She does not wear underwear and has been less sexually active recently due to previous yeast infections triggered by sexual activity. She showers regularly and avoids using bubble baths or soaps in the genital area.  No current vaginal discharge or itching in the groin area, but there is itching in the labia. No symptoms of a urinary tract infection.      PERTINENT PMH / PSH: As above  OBJECTIVE:  BP 100/68   Pulse 94   Resp 18   Ht 5\' 8"  (1.727 m)   Wt 196 lb 8 oz (89.1 kg)   SpO2 93%   BMI 29.88 kg/m    Physical Exam Vitals  reviewed.  Constitutional:      General: She is not in acute distress.    Appearance: Normal appearance. She is not ill-appearing, toxic-appearing or diaphoretic.  Eyes:     General:        Right eye: No discharge.        Left eye: No discharge.     Conjunctiva/sclera: Conjunctivae normal.  Cardiovascular:     Rate and Rhythm: Normal rate.  Pulmonary:     Effort: Pulmonary effort is normal.  Skin:    General: Skin is warm and dry.  Neurological:     Mental Status: She is alert and oriented to person, place, and time. Mental status is at baseline.  Psychiatric:        Mood and Affect: Mood normal.        Behavior: Behavior normal.        Thought Content: Thought content normal.        Judgment: Judgment normal.           12/20/2022    8:49 AM 11/22/2022    8:07 AM 11/02/2022   10:31 AM 02/18/2022    9:51 AM 10/29/2021   11:00 AM  Depression screen PHQ 2/9  Decreased Interest 0 0 0 0 0  Down, Depressed, Hopeless 0 0 0 0 0  PHQ - 2 Score 0 0 0 0 0  Altered sleeping 0 0 0 0   Tired, decreased energy 1 1 1 1    Change in appetite 0 3 3 0  Feeling bad or failure about yourself  0 0 0 0   Trouble concentrating 0 0 0 0   Moving slowly or fidgety/restless 0 0 0 0   Suicidal thoughts 0 0 0 0   PHQ-9 Score 1 4 4 1    Difficult doing work/chores Not difficult at all Not difficult at all Not difficult at all Not difficult at all       12/20/2022    8:50 AM 11/22/2022    8:08 AM 02/18/2022    9:51 AM 08/28/2019    1:34 PM  GAD 7 : Generalized Anxiety Score  Nervous, Anxious, on Edge 0 0 0 0  Control/stop worrying 0 0 0 0  Worry too much - different things 0 0 0 0  Trouble relaxing 0 0 0 0  Restless 0 0 0 0  Easily annoyed or irritable 0 0 0 0  Afraid - awful might happen 0 0 0 0  Total GAD 7 Score 0 0 0 0  Anxiety Difficulty Not difficult at all Not difficult at all Not difficult at all Not difficult at all    ASSESSMENT/PLAN:  Candidiasis of genitalia Assessment &  Plan: Likely secondary to Jardiance use, which increases urinary glucose and predisposes to yeast infections. Symptoms have improved with over-the-counter treatments. No signs of active infection on examination today. -Prescribe Diflucan, one dose now and one dose to be taken in 3-4 days if symptoms persist. -Start daily probiotics. -If another yeast infection occurs, consider discontinuing Jardiance.  Orders: -     Fluconazole; Take 1 tablet (150 mg total) by mouth once for 1 dose. May use additional dose in 3 days if mo improvement in symptoms  Dispense: 1 tablet; Refill: 1  Type 2 diabetes with complication [facility]) Assessment & Plan: Currently managed with Jardiance. Patient reports improvement in symptoms of hyperglycemia. Last A1C was 6.0. Did not tolerate Metformin in past due to GI side effects. -Continue Jardiance. -Check A1C in 1 month. -If recurrent yeast infections persist, consider switching to Rybelsus or Ozempic.  Orders: -     Hemoglobin A1c; Future -     Comprehensive metabolic panel; Future    PDMP reviewed  Return in about 4 weeks (around 03/21/2023) for PCP.  Dana Allan, MD

## 2023-02-21 NOTE — Telephone Encounter (Signed)
Pt has an appointment on 02/21/2023 @ 4 pm to discuss medication change.

## 2023-02-21 NOTE — Assessment & Plan Note (Signed)
Currently managed with Jardiance. Patient reports improvement in symptoms of hyperglycemia. Last A1C was 6.0. Did not tolerate Metformin in past due to GI side effects. -Continue Jardiance. -Check A1C in 1 month. -If recurrent yeast infections persist, consider switching to Rybelsus or Ozempic.

## 2023-03-08 ENCOUNTER — Telehealth: Payer: Self-pay

## 2023-03-08 NOTE — Telephone Encounter (Signed)
 Form has been faxed and place in scan.

## 2023-03-08 NOTE — Telephone Encounter (Signed)
 Spoke to [employer] and also gave them the information.

## 2023-03-08 NOTE — Telephone Encounter (Signed)
 Copied from CRM [id number]. Topic: Clinical - Medical Advice >> Mar 08, 2023 11:23 AM Tiffany H wrote: Reason for CRM: Glenna with [employer] called to request a chronic condition verification. She advised that she's been sending faxes for written request since end of January. Please reach out to [facility], as the chronic condition verification can be done verbally.   [place] needs to know if the patient has diabetes or congestive heart failure. Please advise.   Phone: [phone] Fax: [phone]

## 2023-03-14 ENCOUNTER — Other Ambulatory Visit: Payer: Self-pay

## 2023-03-14 DIAGNOSIS — C50511 Malignant neoplasm of lower-outer quadrant of right female breast: Secondary | ICD-10-CM

## 2023-03-14 DIAGNOSIS — D0511 Intraductal carcinoma in situ of right breast: Secondary | ICD-10-CM

## 2023-03-14 NOTE — Progress Notes (Unsigned)
 Patient Care Team: Dana Allan, MD as PCP - General (Family Medicine) Dorothy Puffer, MD as Consulting Physician (Radiation Oncology) Malachy Mood, MD as Consulting Physician (Hematology) Manus Rudd, MD as Consulting Physician (General Surgery) Pershing Proud, RN as Oncology Nurse Navigator Donnelly Angelica, RN as Oncology Nurse Navigator Pollyann Samples, NP as Nurse Practitioner (Nurse Practitioner)   CHIEF COMPLAINT: Follow up right breast cancer  Oncology History Overview Note  Cancer Staging Malignant neoplasm of lower-outer quadrant of right breast of female, estrogen receptor positive (HCC) Staging form: Breast, AJCC 8th Edition - Clinical stage from 03/14/2019: Stage IA (cT1a, cN0, cM0, G1, ER+, PR+, HER2-) - Signed by Malachy Mood, MD on 03/20/2019 - Pathologic stage from 03/28/2019: Stage IA (pT1b, pN0(sn), cM0, G1, ER+, PR+, HER2-) - Unsigned    Malignant neoplasm of lower-outer quadrant of right breast of female, estrogen receptor positive (HCC)  02/22/2019 Imaging   Bone Density Scan  02/22/19 DEXA shows osteopenia with lowest T-score -1.4 at left hip   03/09/2019 Mammogram   Diagnostic Mammogram 03/09/19 IMPRESSION the 5mm mass in 7:00 posiiton of right breast suspicous of malignancy.     03/14/2019 Cancer Staging   Staging form: Breast, AJCC 8th Edition - Clinical stage from 03/14/2019: Stage IA (cT1a, cN0, cM0, G1, ER+, PR+, HER2-) - Signed by Malachy Mood, MD on 03/20/2019   03/14/2019 Initial Biopsy   Diagnosis 03/14/19  Breast, right, needle core biopsy, 7 o'clock, 5cmfn - INVASIVE DUCTAL CARCINOMA. SEE NOTE Diagnosis Note Carcinoma measures 0.4 cm in greatest linear dimension and appears grade 1. Dr. Rayetta Pigg reviewed the case and concurs with the diagnosis. A breast prognostic profile (ER, PR, Ki-67 and HER2) is pending and will be reported in an addendum. Dr. Yolanda Bonine was notified on 11/12/2019.   03/14/2019 Receptors her2   PROGNOSTIC  INDICATORS Results: IMMUNOHISTOCHEMICAL AND MORPHOMETRIC ANALYSIS PERFORMED MANUALLY The tumor cells are EQUIVOCAL for Her2 (2+). Her2 by FISH will be performed and results reported separately. Estrogen Receptor: 95%, POSITIVE, STRONG STAINING INTENSITY Progesterone Receptor: 90%, POSITIVE, MODERATE STAINING INTENSITY Proliferation Marker Ki67: 10%   03/19/2019 Initial Diagnosis   Malignant neoplasm of lower-outer quadrant of right breast of female, estrogen receptor positive (HCC)   03/28/2019 Surgery   RIGHT BREAST LUMPECTOMY WITH RADIOACTIVE SEED AND SENTINEL LYMPH NODE BIOPSY by Dr Corliss Skains    03/28/2019 Pathology Results   FINAL MICROSCOPIC DIAGNOSIS:   A. BREAST, RIGHT, LUMPECTOMY:  - Invasive ductal carcinoma, 0.9 cm, Nottingham grade 1 of 3.  - Ductal carcinoma in situ, intermediate nuclear grade.  - Margins of resection are not involved.       - Invasive carcinoma, closest margin: 3 mm, superior.       - In situ carcinoma, closest margin: 3 mm, medial.  - Biopsy site.  - See oncology table.   B. SENTINEL LYMPH NODE, RIGHT AXILLARY #1, BIOPSY:  - One lymph node, negative for carcinoma (0/1).   C. SENTINEL LYMPH NODE, RIGHT AXILLARY #2, BIOPSY:  - One lymph node, negative for carcinoma (0/1).    03/28/2019 Cancer Staging   Staging form: Breast, AJCC 8th Edition - Pathologic stage from 03/28/2019: Stage IA (pT1b, pN0(sn), cM0, G1, ER+, PR+, HER2-) - Signed by Malachy Mood, MD on 06/03/2019   04/26/2019 Genetic Testing   Negative genetic testing:  No pathogenic variants detected on the [facility] Common Hereditary Cancers panel. The report date is 04/26/2019.  The Common Hereditary Cancers Panel offered by [facility] includes sequencing and/or deletion duplication testing of  the following 48 genes: APC, ATM, AXIN2, BARD1, BMPR1A, BRCA1, BRCA2, BRIP1, CDH1, CDK4, CDKN2A (p14ARF), CDKN2A (p16INK4a), CHEK2, CTNNA1, DICER1, EPCAM (Deletion/duplication testing only), GREM1 (promoter region  deletion/duplication testing only), KIT, MEN1, MLH1, MSH2, MSH3, MSH6, MUTYH, NBN, NF1, NHTL1, PALB2, PDGFRA, PMS2, POLD1, POLE, PTEN, RAD50, RAD51C, RAD51D, RNF43, SDHB, SDHC, SDHD, SMAD4, SMARCA4. STK11, TP53, TSC1, TSC2, and VHL.  The following genes were evaluated for sequence changes only: SDHA and HOXB13 c.251G>A variant only.    05/08/2019 - 06/04/2019 Radiation Therapy   Adjuvant Radiation with Dr Mitzi Hansen    06/2019 -  Anti-estrogen oral therapy   Tamoxifen starting in 06/2019    09/04/2019 Survivorship   SCP delivered by Santiago Glad, NP       CURRENT THERAPY: Tamoxifen, starting 06/2019  INTERVAL HISTORY Ms. Bergthold returns for follow up as scheduled. Last seen by Dr. Mosetta Putt 6 months ago.   ROS   Past Medical History:  Diagnosis Date   Cancer (HCC) 02/2019   right breast IDC   Cataracts, bilateral    tbd surgery 2021   Complication of anesthesia    Cough    Diabetes mellitus without complication (HCC)    Difficult intubation    TRACH AT 19   Dry eye syndrome    Dyspnea    W/ COUGHING- was due to lisinopril and septic gallbladder   Family history of adverse reaction to anesthesia    sister coded with propofol use   Family history of breast cancer    Family history of lung cancer    Family history of prostate cancer    Family history of skin cancer    GERD (gastroesophageal reflux disease)    Glaucoma    stable glaucoma suspect 01/19/21 [facility]   Headache    High cholesterol    History of hiatal hernia    History of pneumonia    walking pneumonia "years ago"   Hypertension    Hypothyroidism    Irritable bowel    Osteoarthritis    i.e right knee    PONV (postoperative nausea and vomiting)    Pre-diabetes    Retinopathy    htn 01/19/21 [facility]   Rheumatoid arthritis (HCC)    Squamous cell carcinoma of skin of chest    removed   Thyroid disease      Past Surgical History:  Procedure Laterality Date   ABDOMINAL HYSTERECTOMY     age 49 ovaries out    BREAST LUMPECTOMY WITH RADIOACTIVE SEED AND SENTINEL LYMPH NODE BIOPSY Right 03/28/2019   Procedure: RIGHT BREAST LUMPECTOMY WITH RADIOACTIVE SEED AND SENTINEL LYMPH NODE BIOPSY;  Surgeon: Manus Rudd, MD;  Location: [facility];  Service: General;  Laterality: Right;  PEC BLOCK FOR POST OP PAIN   BREAST SURGERY     CATARACT EXTRACTION, BILATERAL     CHOLECYSTECTOMY     10/2019   COLONOSCOPY     x2   ESOPHAGEAL DILATION     Dr. Juanda Chance 15 years from 2021- x2   MASTECTOMY W/ SENTINEL NODE BIOPSY Right 09/14/2022   Procedure: RIGHT MASTECTOMY WITH SENTINEL LYMPH NODE BIOPSY;  Surgeon: Manus Rudd, MD;  Location: [facility];  Service: General;  Laterality: Right;  LMA PEC BLOCK   SKIN BIOPSY     04/2022, right arm, left leg   TONSILLECTOMY     68 years old- had trach   TRACHEOSTOMY     68 years old- placed and removed within 24 hours during tonsillectomy  TUBAL LIGATION       Outpatient Encounter Medications as of 03/15/2023  Medication Sig   amLODipine (NORVASC) 2.5 MG tablet TAKE 1 TABLET(2.5 MG) BY MOUTH DAILY   Ascorbic Acid (VITAMIN C) 1000 MG tablet Take 1,000 mg by mouth daily.   aspirin-acetaminophen-caffeine (EXCEDRIN MIGRAINE) 250-250-65 MG tablet Take 2 tablets by mouth every 8 (eight) hours as needed for headache.   Blood Glucose Monitoring Suppl DEVI 1 each by Does not apply route 3 (three) times a week.   Calcium Carb-Cholecalciferol (CALCIUM 600 + D PO) Take 1 tablet by mouth 2 (two) times daily.   Calcium Carbonate Antacid 1177 MG CHEW Chew 1,177 mg by mouth at bedtime.   Carboxymethylcellulose Sod PF (THERATEARS NIGHTTIME) 1 % GEL Place 1 Application into both eyes at bedtime.   cyanocobalamin (VITAMIN B12) 1000 MCG tablet Take 1,000 mcg by mouth every other day.   Dextran 70-Hypromellose ([employer] NATURAL TEARS PF OP) Place 1 drop into both eyes See admin instructions. Instill 1 drop into both eyes up to 6 times daily as needed for dry eyes   empagliflozin  (JARDIANCE) 10 MG TABS tablet Take 1 tablet (10 mg total) by mouth daily before breakfast.   empagliflozin (JARDIANCE) 10 MG TABS tablet Take 1 tablet (10 mg total) by mouth daily before breakfast.   esomeprazole (NEXIUM) 40 MG capsule TAKE 1 CAPSULE(40 MG) BY MOUTH DAILY   famotidine (PEPCID) 20 MG tablet Take 1 tablet (20 mg total) by mouth 2 (two) times daily. (Patient taking differently: Take 20 mg by mouth at bedtime.)   glucosamine-chondroitin 500-400 MG tablet Take 1 tablet by mouth 2 (two) times daily.   Glucose Blood (BLOOD GLUCOSE TEST STRIPS) STRP 1 each by In Vitro route 3 (three) times a week.   Lancets (ONETOUCH DELICA PLUS LANCET33G) MISC SMARTSIG:Via Meter 3 Times a Week   Lancets Misc. MISC 1 each by Does not apply route 3 (three) times a week.   leflunomide (ARAVA) 20 MG tablet TAKE 1 TABLET(20 MG) BY MOUTH DAILY   levothyroxine (SYNTHROID) 100 MCG tablet TAKE 1 TABLET BY MOUTH EVERY DAY 30 MINUTES BEFORE FOOD   loratadine (CLARITIN) 10 MG tablet Take 10 mg by mouth daily.   Melatonin 10 MG CAPS Take 10 mg by mouth at bedtime.   MIEBO 1.338 GM/ML SOLN Place 1 drop into both eyes in the morning and at bedtime.   Multiple Vitamin (MULTIVITAMIN WITH MINERALS) TABS tablet Take 1 tablet by mouth daily.   olmesartan-hydrochlorothiazide (BENICAR HCT) 20-12.5 MG tablet Take 1 tablet by mouth every morning.   Omega-3 Fatty Acids (FISH OIL) 1000 MG CAPS Take 1,000 mg by mouth daily.   oxyCODONE (OXY IR/ROXICODONE) 5 MG immediate release tablet Take 1-2 tablets (5-10 mg total) by mouth every 4 (four) hours as needed for moderate pain.   promethazine (PHENERGAN) 25 MG tablet Take 25 mg by mouth every 6 (six) hours as needed for nausea or vomiting.   rizatriptan (MAXALT) 10 MG tablet Take 1 tablet (10 mg total) by mouth as needed for migraine. May repeat in 2 hours if needed   simvastatin (ZOCOR) 40 MG tablet Take 1 tablet (40 mg total) by mouth at bedtime.   tamoxifen (NOLVADEX) 20 MG  tablet TAKE 1 TABLET(20 MG) BY MOUTH DAILY   tiZANidine (ZANAFLEX) 4 MG tablet TAKE 1 TABLET(4 MG) BY MOUTH EVERY 6 HOURS AS NEEDED FOR MUSCLE SPASMS   topiramate (TOPAMAX) 100 MG tablet TAKE 1 TABLET(100 MG) BY MOUTH DAILY  No facility-administered encounter medications on file as of 03/15/2023.     There were no vitals filed for this visit. There is no height or weight on file to calculate BMI.   PHYSICAL EXAM GENERAL:alert, no distress and comfortable SKIN: no rash  EYES: sclera clear NECK: without mass LYMPH:  no palpable cervical or supraclavicular lymphadenopathy  LUNGS: clear with normal breathing effort HEART: regular rate & rhythm, no lower extremity edema ABDOMEN: abdomen soft, non-tender and normal bowel sounds NEURO: alert & oriented x 3 with fluent speech, no focal motor/sensory deficits Breast exam:  PAC without erythema    CBC    Component Value Date/Time   WBC 4.7 11/18/2022 0941   RBC 4.41 11/18/2022 0941   HGB 13.3 11/18/2022 0941   HGB 14.0 06/25/2022 1125   HCT 41.2 11/18/2022 0941   HCT 42.2 06/25/2022 1125   PLT 275.0 11/18/2022 0941   PLT 285 06/25/2022 1125   MCV 93.3 11/18/2022 0941   MCV 92 06/25/2022 1125   MCH 29.7 09/07/2022 0938   MCHC 32.4 11/18/2022 0941   RDW 13.8 11/18/2022 0941   RDW 12.5 06/25/2022 1125   LYMPHSABS 2.2 11/18/2022 0941   LYMPHSABS 2.1 06/25/2022 1125   MONOABS 0.6 11/18/2022 0941   EOSABS 0.3 11/18/2022 0941   EOSABS 0.2 06/25/2022 1125   BASOSABS 0.0 11/18/2022 0941   BASOSABS 0.1 06/25/2022 1125     CMP     Component Value Date/Time   NA 142 12/06/2022 1040   NA 143 06/25/2022 1125   K 4.0 12/06/2022 1040   CL 106 12/06/2022 1040   CO2 27 12/06/2022 1040   GLUCOSE 122 (H) 12/06/2022 1040   GLUCOSE 87 01/20/2006 1243   BUN 14 12/06/2022 1040   BUN 13 06/25/2022 1125   CREATININE 0.65 12/06/2022 1040   CREATININE 0.62 03/09/2022 1059   CREATININE 0.70 11/19/2021 1116   CALCIUM 9.4 12/06/2022 1040    PROT 6.6 12/06/2022 1040   PROT 6.8 06/25/2022 1125   ALBUMIN 4.1 12/06/2022 1040   ALBUMIN 4.4 06/25/2022 1125   AST 18 12/06/2022 1040   AST 25 03/09/2022 1059   ALT 11 12/06/2022 1040   ALT 19 03/09/2022 1059   ALKPHOS 49 12/06/2022 1040   BILITOT 0.3 12/06/2022 1040   BILITOT <0.2 06/25/2022 1125   BILITOT 0.4 03/09/2022 1059   GFRNONAA >60 09/07/2022 0938   GFRNONAA >60 03/09/2022 1059   GFRNONAA 93 04/22/2020 1159   GFRAA 108 04/22/2020 1159     ASSESSMENT & PLAN:Bennette A Marulanda is a 68 y.o. female with    1. Malignant neoplasm of lower-outer quadrant of right breast, Stage IA, p(T1bN0M0), ER/PR+, HER2-, Grade I -Diagnosed in 02/2019, s/p right lumpectomy with SLNB on 03/28/19 by Dr Corliss Skains and adjuvant radiation.  -She started antiestrogen therapy with Tamoxifen 20mg  once daily in 06/2019. tolerating well -She had ER+ R DCIS in 08/2022, s/p mastectomy. No adjuvant therapy. She resumed Tamoxifen   2.  Neutropenia -She has had a mild intermittent neutropenia since 03/06/2020, ANC 1.4 at the time, lowest 1.3 on 07/22/2021 -Previously on on methotrexate for RA, switched to leflunomide and tolerating well. Feels RA better under control with Arava -She reports recent sinus infection symptoms, fever, congestion, maxillary sinus pain, and cough. Covid home test negative.  -She has been pushing fluids and vit C, feeling better -Today's CBC shows WBC 3.5, ANC 0.8. I recommend to hold leflunomide until symptoms resolve. She will do so   3. Genetic testing -She  had mother with late age breast cancer and father with prostate cancer in his 25s.  -[facility] panel 04/26/19, negative    4. Comorbidities: Osteoarthritis, Rheumatoid arthritis, Migraines, high cholesterol, hypothyroidism, HTN, GERD -On Arava 20mg  for RA -continue per PCP and medical team -UTD on age appropriate health maintenance. Will check A1c today per pt request   5. Osteopenia  -Her 02/22/19 DEXA showed osteopenia with lowest  T-score is -1.4 at left femur, no high risk for fracture yet.  -Repeat DEXA 02/2021 showed T score -1.9, frax score had improved, still low -Continue calcium/vit D and weight bearing exercise      PLAN:  No orders of the defined types were placed in this encounter.     All questions were answered. The patient knows to call the clinic with any problems, questions or concerns. No barriers to learning were detected. I spent *** counseling the patient face to face. The total time spent in the appointment was *** and more than 50% was on counseling, review of test results, and coordination of care.   Santiago Glad, NP-C @DATE @

## 2023-03-15 ENCOUNTER — Other Ambulatory Visit: Payer: Self-pay | Admitting: Family Medicine

## 2023-03-15 ENCOUNTER — Inpatient Hospital Stay: Payer: PPO | Admitting: Nurse Practitioner

## 2023-03-15 ENCOUNTER — Inpatient Hospital Stay: Payer: PPO | Attending: Hematology

## 2023-03-15 ENCOUNTER — Encounter: Payer: Self-pay | Admitting: Nurse Practitioner

## 2023-03-15 VITALS — BP 120/78 | HR 100 | Temp 97.9°F | Resp 17 | Wt 198.9 lb

## 2023-03-15 DIAGNOSIS — M85852 Other specified disorders of bone density and structure, left thigh: Secondary | ICD-10-CM

## 2023-03-15 DIAGNOSIS — Z923 Personal history of irradiation: Secondary | ICD-10-CM | POA: Insufficient documentation

## 2023-03-15 DIAGNOSIS — E119 Type 2 diabetes mellitus without complications: Secondary | ICD-10-CM | POA: Insufficient documentation

## 2023-03-15 DIAGNOSIS — G43709 Chronic migraine without aura, not intractable, without status migrainosus: Secondary | ICD-10-CM

## 2023-03-15 DIAGNOSIS — Z853 Personal history of malignant neoplasm of breast: Secondary | ICD-10-CM

## 2023-03-15 DIAGNOSIS — M069 Rheumatoid arthritis, unspecified: Secondary | ICD-10-CM | POA: Diagnosis not present

## 2023-03-15 DIAGNOSIS — Z170 Estrogen receptor positive status [ER+]: Secondary | ICD-10-CM | POA: Insufficient documentation

## 2023-03-15 DIAGNOSIS — Z803 Family history of malignant neoplasm of breast: Secondary | ICD-10-CM | POA: Insufficient documentation

## 2023-03-15 DIAGNOSIS — E7800 Pure hypercholesterolemia, unspecified: Secondary | ICD-10-CM | POA: Insufficient documentation

## 2023-03-15 DIAGNOSIS — Z1721 Progesterone receptor positive status: Secondary | ICD-10-CM | POA: Insufficient documentation

## 2023-03-15 DIAGNOSIS — K219 Gastro-esophageal reflux disease without esophagitis: Secondary | ICD-10-CM | POA: Insufficient documentation

## 2023-03-15 DIAGNOSIS — C50511 Malignant neoplasm of lower-outer quadrant of right female breast: Secondary | ICD-10-CM

## 2023-03-15 DIAGNOSIS — Z79810 Long term (current) use of selective estrogen receptor modulators (SERMs): Secondary | ICD-10-CM | POA: Diagnosis not present

## 2023-03-15 DIAGNOSIS — D0511 Intraductal carcinoma in situ of right breast: Secondary | ICD-10-CM | POA: Insufficient documentation

## 2023-03-15 DIAGNOSIS — E039 Hypothyroidism, unspecified: Secondary | ICD-10-CM | POA: Diagnosis not present

## 2023-03-15 DIAGNOSIS — K589 Irritable bowel syndrome without diarrhea: Secondary | ICD-10-CM | POA: Insufficient documentation

## 2023-03-15 DIAGNOSIS — Z9011 Acquired absence of right breast and nipple: Secondary | ICD-10-CM | POA: Insufficient documentation

## 2023-03-15 DIAGNOSIS — I10 Essential (primary) hypertension: Secondary | ICD-10-CM | POA: Insufficient documentation

## 2023-03-15 DIAGNOSIS — Z1231 Encounter for screening mammogram for malignant neoplasm of breast: Secondary | ICD-10-CM

## 2023-03-15 LAB — CBC WITH DIFFERENTIAL (CANCER CENTER ONLY)
Abs Immature Granulocytes: 0.02 10*3/uL (ref 0.00–0.07)
Basophils Absolute: 0.1 10*3/uL (ref 0.0–0.1)
Basophils Relative: 1 %
Eosinophils Absolute: 0.2 10*3/uL (ref 0.0–0.5)
Eosinophils Relative: 4 %
HCT: 43.3 % (ref 36.0–46.0)
Hemoglobin: 14 g/dL (ref 12.0–15.0)
Immature Granulocytes: 0 %
Lymphocytes Relative: 39 %
Lymphs Abs: 2.3 10*3/uL (ref 0.7–4.0)
MCH: 29.9 pg (ref 26.0–34.0)
MCHC: 32.3 g/dL (ref 30.0–36.0)
MCV: 92.3 fL (ref 80.0–100.0)
Monocytes Absolute: 0.6 10*3/uL (ref 0.1–1.0)
Monocytes Relative: 10 %
Neutro Abs: 2.8 10*3/uL (ref 1.7–7.7)
Neutrophils Relative %: 46 %
Platelet Count: 317 10*3/uL (ref 150–400)
RBC: 4.69 MIL/uL (ref 3.87–5.11)
RDW: 13.5 % (ref 11.5–15.5)
WBC Count: 6 10*3/uL (ref 4.0–10.5)
nRBC: 0 % (ref 0.0–0.2)

## 2023-03-15 LAB — CMP (CANCER CENTER ONLY)
ALT: 10 U/L (ref 0–44)
AST: 17 U/L (ref 15–41)
Albumin: 4.2 g/dL (ref 3.5–5.0)
Alkaline Phosphatase: 49 U/L (ref 38–126)
Anion gap: 9 (ref 5–15)
BUN: 10 mg/dL (ref 8–23)
CO2: 27 mmol/L (ref 22–32)
Calcium: 9.6 mg/dL (ref 8.9–10.3)
Chloride: 106 mmol/L (ref 98–111)
Creatinine: 0.64 mg/dL (ref 0.44–1.00)
GFR, Estimated: >60 mL/min (ref >60)
Glucose, Bld: 115 mg/dL — ABNORMAL HIGH (ref 70–99)
Potassium: 3.9 mmol/L (ref 3.5–5.1)
Sodium: 142 mmol/L (ref 135–145)
Total Bilirubin: 0.3 mg/dL (ref 0.0–1.2)
Total Protein: 7.2 g/dL (ref 6.5–8.1)

## 2023-03-15 MED ORDER — ESOMEPRAZOLE MAGNESIUM 40 MG PO CPDR: DELAYED_RELEASE_CAPSULE | ORAL | 3 refills | Status: AC

## 2023-03-15 MED ORDER — TOPIRAMATE 100 MG PO TABS: ORAL_TABLET | ORAL | 3 refills | Status: AC

## 2023-03-15 NOTE — Telephone Encounter (Signed)
 Last Fill: 01/05/22  Last OV: 02/21/23 Next OV: 04/12/23  Routing to provider for review/authorization.

## 2023-03-15 NOTE — Telephone Encounter (Signed)
 Copied from CRM [id number]. Topic: Clinical - Medication Refill >> Mar 15, 2023  1:31 PM Sonny Dandy B wrote: Most Recent Primary Care Visit:  Provider: Dana Allan  Department: [facility]  Visit Type: OFFICE VISIT  Date: 02/21/2023  Medication: esomeprazole (NEXIUM) 40 MG capsule, topiramate (TOPAMAX) 100 MG tablet  Has the patient contacted their pharmacy? Yes (Agent: If no, request that the patient contact the pharmacy for the refill. If patient does not wish to contact the pharmacy document the reason why and proceed with request.) (Agent: If yes, when and what did the pharmacy advise?)  Is this the correct pharmacy for this prescription? Yes If no, delete pharmacy and type the correct one.  This is the patient's preferred pharmacy:    [facility] [phone] - [place], [place] - [address] AT [facility] OF [address] & [address] [place] [address] Phone: [phone] Fax: [phone]   Has the prescription been filled recently? Yes  Is the patient out of the medication? Yes  Has the patient been seen for an appointment in the last year OR does the patient have an upcoming appointment? Yes  Can we respond through MyChart? Yes  Agent: Please be advised that Rx refills may take up to 3 business days. We ask that you follow-up with your pharmacy.

## 2023-03-18 ENCOUNTER — Other Ambulatory Visit: Payer: PPO

## 2023-03-18 DIAGNOSIS — E118 Type 2 diabetes mellitus with unspecified complications: Secondary | ICD-10-CM

## 2023-03-18 LAB — COMPREHENSIVE METABOLIC PANEL
ALT: 11 U/L (ref 0–35)
AST: 19 U/L (ref 0–37)
Albumin: 4.1 g/dL (ref 3.5–5.2)
Alkaline Phosphatase: 46 U/L (ref 39–117)
BUN: 15 mg/dL (ref 6–23)
CO2: 26 meq/L (ref 19–32)
Calcium: 9.7 mg/dL (ref 8.4–10.5)
Chloride: 107 meq/L (ref 96–112)
Creatinine, Ser: 0.66 mg/dL (ref 0.40–1.20)
GFR: 89.44 mL/min (ref >60.00)
Glucose, Bld: 119 mg/dL — ABNORMAL HIGH (ref 70–99)
Potassium: 3.8 meq/L (ref 3.5–5.1)
Sodium: 143 meq/L (ref 135–145)
Total Bilirubin: 0.3 mg/dL (ref 0.2–1.2)
Total Protein: 7.1 g/dL (ref 6.0–8.3)

## 2023-03-18 LAB — HEMOGLOBIN A1C: Hgb A1c MFr Bld: 6.7 % — ABNORMAL HIGH (ref 4.6–6.5)

## 2023-03-21 DIAGNOSIS — C50911 Malignant neoplasm of unspecified site of right female breast: Secondary | ICD-10-CM | POA: Diagnosis not present

## 2023-03-23 ENCOUNTER — Ambulatory Visit: Payer: PPO | Admitting: Family Medicine

## 2023-03-25 ENCOUNTER — Other Ambulatory Visit: Payer: Self-pay | Admitting: Family Medicine

## 2023-03-25 DIAGNOSIS — E118 Type 2 diabetes mellitus with unspecified complications: Secondary | ICD-10-CM

## 2023-03-28 ENCOUNTER — Ambulatory Visit (INDEPENDENT_AMBULATORY_CARE_PROVIDER_SITE_OTHER): Admitting: Family Medicine

## 2023-03-28 ENCOUNTER — Encounter: Payer: Self-pay | Admitting: Family Medicine

## 2023-03-28 VITALS — BP 118/70 | HR 81 | Temp 98.0°F | Resp 20 | Ht 68.0 in | Wt 199.5 lb

## 2023-03-28 DIAGNOSIS — Z7985 Long-term (current) use of injectable non-insulin antidiabetic drugs: Secondary | ICD-10-CM

## 2023-03-28 DIAGNOSIS — E118 Type 2 diabetes mellitus with unspecified complications: Secondary | ICD-10-CM

## 2023-03-28 MED ORDER — SEMAGLUTIDE(0.25 OR 0.5MG/DOS) 2 MG/3ML ~~LOC~~ SOPN: 0.2500 mg | PEN_INJECTOR | SUBCUTANEOUS | Status: DC

## 2023-03-28 MED ORDER — SEMAGLUTIDE(0.25 OR 0.5MG/DOS) 2 MG/3ML ~~LOC~~ SOPN: 0.2500 mg | PEN_INJECTOR | SUBCUTANEOUS | 1 refills | Status: DC

## 2023-03-28 NOTE — Assessment & Plan Note (Addendum)
 A1c increased to 6.7, likely due to dietary changes and stress related to husband's health. Home glucose readings in the 150s-160s. Persistent yeast infection likely secondary to Jardiance. Failed Metformin due to diarrhea and now failing Jardiance due to yeast infection. -Discontinue Jardiance due to persistent yeast infection. -Initiate Ozempic 0.25mg  weekly, with potential increase to 0.5mg  after 4 weeks depending on response and tolerance. Sample provided. -Check A1c in 3 months to assess response to Ozempic. -Encourage patient to monitor blood sugars at home and report if readings exceed 300. -Advise patient to prioritize protein in meals and to eat until satiety is reached to help manage appetite and blood sugar levels. -Follow up in 4 weeks to assess response and tolerance to Ozempic.

## 2023-03-28 NOTE — Progress Notes (Signed)
 SUBJECTIVE:   Chief Complaint  Patient presents with   Abnormal Lab   HPI Presents for acute visit  Discussed the use of AI scribe software for clinical note transcription with the patient, who gave verbal consent to proceed.  History of Present Illness The patient presents with elevated A1c and diabetes management. She is accompanied by her husband, who recently had retina eye surgery.  Her A1c has increased to 6.7, higher than her previous result. She attributes this to poor dietary habits over the past few weeks, as her family has been bringing food due to her husband's recent surgery. Her blood sugar levels at home have been in the 150s to 160s.  She is experiencing a persistent yeast infection, which she manages with cream. The infection is not as severe as before but remains present. She has a history of failing metformin due to constant diarrhea and is now experiencing issues with Jardiance due to the yeast infection.  Her social history includes caring for her husband, who is recovering from retina eye surgery. This has impacted her ability to maintain her usual dietary habits, as she has been relying on family-provided meals. She typically eats two meals a day, with a focus on protein, but acknowledges that her first meal often includes bread. No overeating.      PERTINENT PMH / PSH: As above  OBJECTIVE:  BP 118/70   Pulse 81   Temp 98 F (36.7 C)   Resp 20   Ht 5\' 8"  (1.727 m)   Wt 199 lb 8 oz (90.5 kg)   SpO2 97%   BMI 30.33 kg/m    Physical Exam Vitals reviewed.  Constitutional:      General: She is not in acute distress.    Appearance: Normal appearance. She is obese. She is not ill-appearing, toxic-appearing or diaphoretic.  Eyes:     General:        Right eye: No discharge.        Left eye: No discharge.     Conjunctiva/sclera: Conjunctivae normal.  Cardiovascular:     Rate and Rhythm: Normal rate and regular rhythm.  Pulmonary:     Effort:  Pulmonary effort is normal.  Neurological:     General: No focal deficit present.     Mental Status: She is alert and oriented to person, place, and time. Mental status is at baseline.  Psychiatric:        Mood and Affect: Mood normal.        Behavior: Behavior normal.        Thought Content: Thought content normal.        Judgment: Judgment normal.           12/20/2022    8:49 AM 11/22/2022    8:07 AM 11/02/2022   10:31 AM 02/18/2022    9:51 AM 10/29/2021   11:00 AM  Depression screen PHQ 2/9  Decreased Interest 0 0 0 0 0  Down, Depressed, Hopeless 0 0 0 0 0  PHQ - 2 Score 0 0 0 0 0  Altered sleeping 0 0 0 0   Tired, decreased energy 1 1 1 1    Change in appetite 0 3 3 0   Feeling bad or failure about yourself  0 0 0 0   Trouble concentrating 0 0 0 0   Moving slowly or fidgety/restless 0 0 0 0   Suicidal thoughts 0 0 0 0   PHQ-9 Score 1 4 4  1  Difficult doing work/chores Not difficult at all Not difficult at all Not difficult at all Not difficult at all       12/20/2022    8:50 AM 11/22/2022    8:08 AM 02/18/2022    9:51 AM 08/28/2019    1:34 PM  GAD 7 : Generalized Anxiety Score  Nervous, Anxious, on Edge 0 0 0 0  Control/stop worrying 0 0 0 0  Worry too much - different things 0 0 0 0  Trouble relaxing 0 0 0 0  Restless 0 0 0 0  Easily annoyed or irritable 0 0 0 0  Afraid - awful might happen 0 0 0 0  Total GAD 7 Score 0 0 0 0  Anxiety Difficulty Not difficult at all Not difficult at all Not difficult at all Not difficult at all    ASSESSMENT/PLAN:  Type 2 diabetes with complication [facility]) Assessment & Plan: A1c increased to 6.7, likely due to dietary changes and stress related to husband's health. Home glucose readings in the 150s-160s. Persistent yeast infection likely secondary to Jardiance. Failed Metformin due to diarrhea and now failing Jardiance due to yeast infection. -Discontinue Jardiance due to persistent yeast infection. -Initiate Ozempic 0.25mg  weekly,  with potential increase to 0.5mg  after 4 weeks depending on response and tolerance. Sample provided. -Check A1c in 3 months to assess response to Ozempic. -Encourage patient to monitor blood sugars at home and report if readings exceed 300. -Advise patient to prioritize protein in meals and to eat until satiety is reached to help manage appetite and blood sugar levels. -Follow up in 4 weeks to assess response and tolerance to Ozempic.   Orders: -     Semaglutide(0.25 or 0.5MG /DOS); Inject 0.25 mg into the skin once a week. -     Semaglutide(0.25 or 0.5MG /DOS); Inject 0.25 mg into the skin once a week.  Dispense: 3 mL; Refill: 1    PDMP reviewed  Return in about 4 weeks (around 04/25/2023) for PCP.  Dana Allan, MD

## 2023-03-28 NOTE — Patient Instructions (Addendum)
 It was a pleasure meeting you today. Thank you for allowing me to take part in your health care.  Our goals for today as we discussed include:  Stop Jardiance  Start Ozempic 0.25 mg weekly, sample provided  Follow up in 4 weeks   This is a list of the screening recommended for you and due dates:  Health Maintenance  Topic Date Due   Zoster (Shingles) Vaccine (1 of 2) Never done   COVID-19 Vaccine (5 - 2024-25 season) 09/19/2022   Eye exam for diabetics  02/16/2023   Hemoglobin A1C  09/15/2023   Medicare Annual Wellness Visit  11/02/2023   Yearly kidney health urinalysis for diabetes  11/03/2023   Complete foot exam   11/08/2023   Yearly kidney function blood test for diabetes  03/17/2024   Mammogram  08/04/2024   Colon Cancer Screening  08/13/2024   DTaP/Tdap/Td vaccine (3 - Td or Tdap) 12/16/2024   Pneumonia Vaccine  Completed   Flu Shot  Completed   DEXA scan (bone density measurement)  Completed   Hepatitis C Screening  Completed   HPV Vaccine  Aged Out      If you have any questions or concerns, please do not hesitate to call the office at [phone].  I look forward to our next visit and until then take care and stay safe.  Regards,   Dana Allan, MD   [facility]

## 2023-03-31 ENCOUNTER — Other Ambulatory Visit: Payer: Self-pay | Admitting: *Deleted

## 2023-03-31 DIAGNOSIS — M0579 Rheumatoid arthritis with rheumatoid factor of multiple sites without organ or systems involvement: Secondary | ICD-10-CM

## 2023-03-31 MED ORDER — LEFLUNOMIDE 20 MG PO TABS: 20.0000 mg | ORAL_TABLET | Freq: Every day | ORAL | 0 refills | Status: DC

## 2023-03-31 NOTE — Telephone Encounter (Signed)
 Patient contacted the office and requested a refill on Arava to be sent to the [facility] in [place].   Last Fill: 11/21/2022  Labs: 03/15/2023 Glucose 115  Next Visit: 06/30/2023  Last Visit: 01/24/2023  DX: Rheumatoid arthritis involving multiple sites with positive rheumatoid factor   Current Dose per office note 01/24/2023: Arava 20 mg 1 tablet by mouth daily.   Okay to refill Arava ?

## 2023-04-11 DIAGNOSIS — C50911 Malignant neoplasm of unspecified site of right female breast: Secondary | ICD-10-CM | POA: Diagnosis not present

## 2023-04-12 DIAGNOSIS — X32XXXD Exposure to sunlight, subsequent encounter: Secondary | ICD-10-CM | POA: Diagnosis not present

## 2023-04-12 DIAGNOSIS — Z1283 Encounter for screening for malignant neoplasm of skin: Secondary | ICD-10-CM | POA: Diagnosis not present

## 2023-04-12 DIAGNOSIS — L570 Actinic keratosis: Secondary | ICD-10-CM | POA: Diagnosis not present

## 2023-04-12 DIAGNOSIS — D225 Melanocytic nevi of trunk: Secondary | ICD-10-CM | POA: Diagnosis not present

## 2023-04-25 ENCOUNTER — Telehealth: Payer: Self-pay

## 2023-04-25 ENCOUNTER — Encounter: Payer: Self-pay | Admitting: Pharmacist

## 2023-04-25 ENCOUNTER — Telehealth: Payer: Self-pay | Admitting: Pharmacist

## 2023-04-25 ENCOUNTER — Ambulatory Visit (INDEPENDENT_AMBULATORY_CARE_PROVIDER_SITE_OTHER): Admitting: Family Medicine

## 2023-04-25 ENCOUNTER — Encounter: Payer: Self-pay | Admitting: Family Medicine

## 2023-04-25 VITALS — BP 116/68 | HR 84 | Temp 97.7°F | Resp 20 | Ht 68.0 in | Wt 197.4 lb

## 2023-04-25 DIAGNOSIS — I152 Hypertension secondary to endocrine disorders: Secondary | ICD-10-CM | POA: Diagnosis not present

## 2023-04-25 DIAGNOSIS — E1159 Type 2 diabetes mellitus with other circulatory complications: Secondary | ICD-10-CM | POA: Diagnosis not present

## 2023-04-25 DIAGNOSIS — Z7985 Long-term (current) use of injectable non-insulin antidiabetic drugs: Secondary | ICD-10-CM | POA: Diagnosis not present

## 2023-04-25 DIAGNOSIS — E118 Type 2 diabetes mellitus with unspecified complications: Secondary | ICD-10-CM | POA: Diagnosis not present

## 2023-04-25 NOTE — Progress Notes (Signed)
 SUBJECTIVE:   Chief Complaint  Patient presents with   Diabetes    4 week follow up   HPI Presents for follow up chronic disease management  Discussed the use of AI scribe software for clinical note transcription with the patient, who gave verbal consent to proceed.  History of Present Illness Cheryl Knight "Clide Dalton" is a 68 year old female with type 2 diabetes mellitus who presents for follow-up on Ozempic  treatment.  She started on a 0.25 mg dose of Ozempic  approximately four to five weeks ago, receiving a sample for four weeks and has been using it since. She has not picked up her next prescription as she was not notified by the pharmacy. Her blood sugars have been well-controlled on the current dose, and she has not experienced any significant issues.  She monitors her blood sugar once or twice a week and notes that it was 90 on one occasion when she did not feel well, prompting her to have a snack. Her last A1c was noted to be on the lower side, and her blood sugar levels occasionally rise when she consumes sweets, particularly on weekends.  Her appetite has decreased since starting Ozempic , and she typically eats brunch, dinner, and a small snack in between, such as fruit or cottage cheese. She does not eat large meals and feels that the medication has helped curb her appetite.  No chest pain, shortness of breath, or diarrhea.    PERTINENT PMH / PSH: As above  OBJECTIVE:  BP 116/68   Pulse 84   Temp 97.7 F (36.5 C)   Resp 20   Ht 5\' 8"  (1.727 m)   Wt 197 lb 6 oz (89.5 kg)   SpO2 97%   BMI 30.01 kg/m    Physical Exam Vitals reviewed.  Constitutional:      General: She is not in acute distress.    Appearance: Normal appearance. She is obese. She is not ill-appearing, toxic-appearing or diaphoretic.  Eyes:     General:        Right eye: No discharge.        Left eye: No discharge.     Conjunctiva/sclera: Conjunctivae normal.  Cardiovascular:     Rate and  Rhythm: Normal rate.  Pulmonary:     Effort: Pulmonary effort is normal.  Musculoskeletal:        General: Normal range of motion.  Skin:    General: Skin is warm and dry.  Neurological:     Mental Status: She is alert and oriented to person, place, and time. Mental status is at baseline.  Psychiatric:        Mood and Affect: Mood normal.        Behavior: Behavior normal.        Thought Content: Thought content normal.        Judgment: Judgment normal.           04/25/2023    9:24 AM 12/20/2022    8:49 AM 11/22/2022    8:07 AM 11/02/2022   10:31 AM 02/18/2022    9:51 AM  Depression screen PHQ 2/9  Decreased Interest 0 0 0 0 0  Down, Depressed, Hopeless 0 0 0 0 0  PHQ - 2 Score 0 0 0 0 0  Altered sleeping 0 0 0 0 0  Tired, decreased energy 0 1 1 1 1   Change in appetite 0 0 3 3 0  Feeling bad or failure about yourself  0 0 0  0 0  Trouble concentrating 0 0 0 0 0  Moving slowly or fidgety/restless 0 0 0 0 0  Suicidal thoughts 0 0 0 0 0  PHQ-9 Score 0 1 4 4 1   Difficult doing work/chores Not difficult at all Not difficult at all Not difficult at all Not difficult at all Not difficult at all      04/25/2023    9:24 AM 12/20/2022    8:50 AM 11/22/2022    8:08 AM 02/18/2022    9:51 AM  GAD 7 : Generalized Anxiety Score  Nervous, Anxious, on Edge 0 0 0 0  Control/stop worrying 0 0 0 0  Worry too much - different things 0 0 0 0  Trouble relaxing 0 0 0 0  Restless 0 0 0 0  Easily annoyed or irritable 0 0 0 0  Afraid - awful might happen 0 0 0 0  Total GAD 7 Score 0 0 0 0  Anxiety Difficulty Not difficult at all Not difficult at all Not difficult at all Not difficult at all    ASSESSMENT/PLAN:  Type 2 diabetes with complication [facility]) Assessment & Plan: Blood sugars well-controlled on Ozempic  0.25 mg. Last A1c 6.7 Reduced appetite noted. Decision to maintain current dose due to stability. - Continue Ozempic  0.25 mg weekly - Monitor blood sugars regularly and report upward  trends. - Repeat A1c at next visit     Hypertension associated with diabetes [facility]) Assessment & Plan: Well controlled Continue Amlodipine  and Benicar /HCT     PDMP reviewed  Return if symptoms worsen or fail to improve, for PCP.  Valli Gaw, MD

## 2023-04-25 NOTE — Telephone Encounter (Signed)
 PA required for Ozempic

## 2023-04-25 NOTE — Telephone Encounter (Signed)
 Pharmacy Patient Advocate Encounter  Received notification from [facility] [employer] that Prior Authorization for Ozempic (0.25 or 0.5 MG/DOSE) 2MG /3ML pen-injectors has been APPROVED from 04/25/23 to 04/24/2024   PA #/Case ID/Reference #: [id number]

## 2023-04-25 NOTE — Progress Notes (Signed)
 Prior Authorization:   Medication: Ozempic 0.25/05 Prior Authorization Submitted: 04/25/2023 CMM Code: [id number]

## 2023-04-25 NOTE — Patient Instructions (Addendum)
 It was a pleasure meeting you today. Thank you for allowing me to take part in your health care.  Our goals for today as we discussed include:  Continue Ozempic 0.25 mg weekly Blood sugars are looking good.  Continue to monitor.  If increasing >150 will increase medication, please notify MD should glucose increase.  Please call Karin Golden to check on prior approval for medication.  Will recheck your A1c in June at next visit   This is a list of the screening recommended for you and due dates:  Health Maintenance  Topic Date Due   Zoster (Shingles) Vaccine (1 of 2) Never done   COVID-19 Vaccine (5 - 2024-25 season) 09/19/2022   Eye exam for diabetics  02/16/2023   Flu Shot  08/19/2023   Hemoglobin A1C  09/15/2023   Medicare Annual Wellness Visit  11/02/2023   Yearly kidney health urinalysis for diabetes  11/03/2023   Complete foot exam   11/08/2023   Yearly kidney function blood test for diabetes  03/17/2024   Mammogram  08/04/2024   Colon Cancer Screening  08/13/2024   DTaP/Tdap/Td vaccine (3 - Td or Tdap) 12/16/2024   Pneumonia Vaccine  Completed   DEXA scan (bone density measurement)  Completed   Hepatitis C Screening  Completed   HPV Vaccine  Aged Out      If you have any questions or concerns, please do not hesitate to call the office at [phone].  I look forward to our next visit and until then take care and stay safe.  Regards,   Dana Allan, MD   [facility]

## 2023-04-26 ENCOUNTER — Other Ambulatory Visit (HOSPITAL_COMMUNITY): Payer: Self-pay

## 2023-04-26 NOTE — Telephone Encounter (Signed)
 Good morning her Ozempic was approved yesterday and her test claim copay was $0.00 also there is an encounter from 04/25/23

## 2023-04-26 NOTE — Telephone Encounter (Signed)
 Called pt and advised her for this information.

## 2023-05-08 ENCOUNTER — Encounter: Payer: Self-pay | Admitting: Family Medicine

## 2023-05-08 NOTE — Assessment & Plan Note (Addendum)
 Blood sugars well-controlled on Ozempic  0.25 mg. Last A1c 6.7 Reduced appetite noted. Decision to maintain current dose due to stability. - Continue Ozempic  0.25 mg weekly - Monitor blood sugars regularly and report upward trends. - Repeat A1c at next visit

## 2023-05-08 NOTE — Assessment & Plan Note (Addendum)
 Well controlled Continue Amlodipine  and Benicar Cheryl Knight

## 2023-05-10 ENCOUNTER — Other Ambulatory Visit: Payer: Self-pay | Admitting: Physician Assistant

## 2023-05-10 DIAGNOSIS — M0579 Rheumatoid arthritis with rheumatoid factor of multiple sites without organ or systems involvement: Secondary | ICD-10-CM

## 2023-05-18 ENCOUNTER — Other Ambulatory Visit: Payer: Self-pay | Admitting: Family Medicine

## 2023-05-18 DIAGNOSIS — E039 Hypothyroidism, unspecified: Secondary | ICD-10-CM

## 2023-05-18 DIAGNOSIS — C50911 Malignant neoplasm of unspecified site of right female breast: Secondary | ICD-10-CM | POA: Diagnosis not present

## 2023-05-18 DIAGNOSIS — K219 Gastro-esophageal reflux disease without esophagitis: Secondary | ICD-10-CM

## 2023-05-18 DIAGNOSIS — E1159 Type 2 diabetes mellitus with other circulatory complications: Secondary | ICD-10-CM

## 2023-05-18 NOTE — Telephone Encounter (Signed)
 Copied from CRM [id number]. Topic: Clinical - Medication Refill >> May 18, 2023 11:04 AM Albertha Alosa wrote: Most Recent Primary Care Visit:  Provider: WALSH, TANYA  Department: [facility]  Visit Type: OFFICE VISIT  Date: 04/25/2023  Medication: olmesartan -hydrochlorothiazide  (BENICAR  HCT) 20-12.5 MG tablet  amLODipine  (NORVASC ) 2.5 MG tablet  famotidine  (PEPCID ) 20 MG tablet   levothyroxine  (SYNTHROID ) 100 MCG tablet  Has the patient contacted their pharmacy? Yes  (Agent: If no, request that the patient contact the pharmacy for the refill. If patient does not wish to contact the pharmacy document the reason why and proceed with request.) (Agent: If yes, when and what did the pharmacy advise?)  Is this the correct pharmacy for this prescription? Yes If no, delete pharmacy and type the correct one.  This is the patient's preferred pharmacy:  [facility] [id number] [place], [place] - [address] [place] [address] Phone: [phone] Fax: [phone]   Has the prescription been filled recently? No  Is the patient out of the medication? Yes  Has the patient been seen for an appointment in the last year OR does the patient have an upcoming appointment? Yes  Can we respond through MyChart? Yes  Agent: Please be advised that Rx refills may take up to 3 business days. We ask that you follow-up with your pharmacy.

## 2023-05-19 ENCOUNTER — Other Ambulatory Visit: Payer: Self-pay

## 2023-05-19 MED ORDER — TAMOXIFEN CITRATE 20 MG PO TABS: ORAL_TABLET | ORAL | 3 refills | Status: DC

## 2023-05-20 MED ORDER — AMLODIPINE BESYLATE 2.5 MG PO TABS: ORAL_TABLET | ORAL | 0 refills | Status: DC

## 2023-05-20 MED ORDER — LEVOTHYROXINE SODIUM 100 MCG PO TABS: ORAL_TABLET | ORAL | 0 refills | Status: DC

## 2023-05-20 MED ORDER — OLMESARTAN MEDOXOMIL-HCTZ 20-12.5 MG PO TABS: 1.0000 | ORAL_TABLET | Freq: Every morning | ORAL | 0 refills | Status: DC

## 2023-05-20 MED ORDER — FAMOTIDINE 20 MG PO TABS: 20.0000 mg | ORAL_TABLET | Freq: Two times a day (BID) | ORAL | 0 refills | Status: DC

## 2023-06-08 NOTE — Progress Notes (Unsigned)
 Office Visit Note  Patient: Cheryl Knight             Date of Birth: [birth year]           MRN: [id number]             PCP: Valli Gaw, MD Referring: Valli Gaw, MD Visit Date: 06/22/2023 Occupation: @GUAROCC @  Subjective:  Pain of both wrist joints and both ankle joints  History of Present Illness: Cheryl Knight is a 68 y.o. female with history of seropositive rheumatoid arthritis, ILD, and osteoarthritis.  Patient remains on  Arava  20 mg 1 tablet by mouth daily.  She is tolerating Arava  without any side effects and has not had any recent gaps in therapy.  Patient states for the past 1 week she has been experiencing increased discomfort in both wrist joints, both ankle joints, and the left elbow joint.  She has been performing yard work which she feels has exacerbated her symptoms.  Patient states that her ankle pain is worse with range of motion especially inversion and eversion of the ankle.  She has not noticed any joint swelling.  She has tried taking Tylenol  over-the-counter for symptomatic relief. She denies any new or worsening pulmonary symptoms.  She denies any recent or recurrent infections.    Activities of Daily Living:  Patient reports morning stiffness for 10 minutes.   Patient Reports nocturnal pain.  Difficulty dressing/grooming: Reports Difficulty climbing stairs: Denies Difficulty getting out of chair: Reports Difficulty using hands for taps, buttons, cutlery, and/or writing: Reports  Review of Systems  Constitutional:  Positive for fatigue.  HENT:  Negative for mouth sores and mouth dryness.   Eyes:  Positive for dryness.  Respiratory:  Negative for shortness of breath.   Cardiovascular:  Negative for chest pain and palpitations.  Gastrointestinal:  Positive for constipation. Negative for blood in stool and diarrhea.  Endocrine: Positive for increased urination.  Genitourinary:  Negative for involuntary urination.  Musculoskeletal:  Positive for  joint pain, joint pain, joint swelling, myalgias, morning stiffness and myalgias. Negative for gait problem, muscle weakness and muscle tenderness.  Skin:  Negative for color change, rash, hair loss and sensitivity to sunlight.  Allergic/Immunologic: Negative for susceptible to infections.  Neurological:  Positive for headaches. Negative for dizziness.  Hematological:  Negative for swollen glands.  Psychiatric/Behavioral:  Negative for depressed mood and sleep disturbance. The patient is not nervous/anxious.     PMFS History:  Patient Active Problem List   Diagnosis Date Noted   Candidiasis of genitalia 02/21/2023   Chronic migraine w/o aura w/o status migrainosus, not intractable 12/20/2022   Seroma of breast 11/09/2022   Diarrhea 11/09/2022   Need for influenza vaccination 11/09/2022   Type 2 diabetes with complication (HCC) 11/09/2022   Invasive ductal carcinoma of breast, female, right (HCC) 11/03/2022   Ductal carcinoma in situ (DCIS) of right breast 08/26/2022   ILD (interstitial lung disease) (HCC) 07/05/2022   Chronic back pain 11/07/2021   Fatty liver 06/24/2020   Pulmonary fibrosis (HCC) 10/01/2019   Pulmonary nodules 10/01/2019   Bronchiectasis without complication (HCC) 10/01/2019   Aortic atherosclerosis (HCC) 10/01/2019   Hypertension associated with diabetes (HCC) 10/01/2019   Paresthesia of upper extremity 10/01/2019   Hiatal hernia 08/16/2019   Dry eye syndrome 08/05/2019   Glaucoma 08/05/2019   Hypertensive retinopathy 08/05/2019   Arteriosclerosis 08/05/2019   Pterygium 08/05/2019   Ocular rosacea 08/05/2019   Malignant neoplasm of lower-outer quadrant of right breast of female,  estrogen receptor positive (HCC) 03/19/2019   Allergic rhinitis 02/08/2019   Osteoarthritis    Obesity (BMI 30-39.9) 12/30/2015   Basal cell carcinoma of chest wall 10/05/2011   Hyperlipidemia 02/27/2008   Hypothyroidism 02/21/2007   Migraine 02/21/2007   VAGINITIS, ATROPHIC  02/21/2007   Psoriasis 02/21/2007   Sicca syndrome, unspecified 02/21/2007   GERD 06/29/2006   Rheumatoid arthritis (HCC) 06/29/2006    Past Medical History:  Diagnosis Date   Cancer (HCC) 02/2019   right breast IDC   Cataracts, bilateral    tbd surgery 2021   Complication of anesthesia    Cough    Diabetes mellitus without complication (HCC)    Difficult intubation    TRACH AT 19   Dry eye syndrome    Dyspnea    W/ COUGHING- was due to lisinopril  and septic gallbladder   Family history of adverse reaction to anesthesia    sister coded with propofol  use   Family history of breast cancer    Family history of lung cancer    Family history of prostate cancer    Family history of skin cancer    GERD (gastroesophageal reflux disease)    Glaucoma    stable glaucoma suspect 01/19/21 [facility]   Headache    High cholesterol    History of hiatal hernia    History of pneumonia    walking pneumonia "years ago"   Hypertension    Hypothyroidism    Irritable bowel    Osteoarthritis    i.e right knee    PONV (postoperative nausea and vomiting)    Pre-diabetes    Retinopathy    htn 01/19/21 [facility]   Rheumatoid arthritis (HCC)    Squamous cell carcinoma of skin of chest    removed   Thyroid  disease     Family History  Problem Relation Age of Onset   Hypertension Mother    Thyroid  disease Mother    Breast cancer Mother 84       breast dx age 37 died age 83    Prostate cancer Father 13       metastatic   Stroke Maternal Grandmother    Skin cancer Maternal Uncle        dx. in his 53s   Lung cancer Maternal Uncle        smoker, died age 73   Past Surgical History:  Procedure Laterality Date   ABDOMINAL HYSTERECTOMY     age 74 ovaries out   BREAST LUMPECTOMY WITH RADIOACTIVE SEED AND SENTINEL LYMPH NODE BIOPSY Right 03/28/2019   Procedure: RIGHT BREAST LUMPECTOMY WITH RADIOACTIVE SEED AND SENTINEL LYMPH NODE BIOPSY;  Surgeon: Dareen Ebbing, MD;  Location: [facility];  Service: General;  Laterality: Right;  PEC BLOCK FOR POST OP PAIN   BREAST SURGERY     CATARACT EXTRACTION, BILATERAL     CHOLECYSTECTOMY     10/2019   COLONOSCOPY     x2   ESOPHAGEAL DILATION     Dr. Grandville Lax 15 years from 2021- x2   MASTECTOMY W/ SENTINEL NODE BIOPSY Right 09/14/2022   Procedure: RIGHT MASTECTOMY WITH SENTINEL LYMPH NODE BIOPSY;  Surgeon: Dareen Ebbing, MD;  Location: [facility];  Service: General;  Laterality: Right;  LMA PEC BLOCK   SKIN BIOPSY     04/2022, right arm, left leg   TONSILLECTOMY     68 years old- had trach   TRACHEOSTOMY     68 years old- placed and removed  within 24 hours during tonsillectomy   TUBAL LIGATION     Social History   Social History Narrative   Nurse at [employer]   Has a beach house    Immunization History  Administered Date(s) Administered   Fluad Quad(high Dose 65+) 03/11/2020, 10/29/2021   Fluad Trivalent(High Dose 65+) 11/08/2022   H1N1 02/27/2008   Influenza-Unspecified 11/18/2017   PFIZER(Purple Top)SARS-COV-2 Vaccination 03/02/2019, 03/29/2019, 01/01/2020   PNEUMOCOCCAL CONJUGATE-20 07/22/2021   Pfizer Covid-19 Vaccine Bivalent Booster 88yrs & up 02/02/2021   Pneumococcal Conjugate-13 10/03/2019   Td 01/19/2004   Tdap 12/17/2014     Objective: Vital Signs: BP 132/82 (BP Location: Left Arm, Patient Position: Sitting, Cuff Size: Normal)   Pulse 89   Resp 16   Ht 5\' 8"  (1.727 m)   Wt 200 lb (90.7 kg)   BMI 30.41 kg/m    Physical Exam Vitals and nursing note reviewed.  Constitutional:      Appearance: She is well-developed.  HENT:     Head: Normocephalic and atraumatic.  Eyes:     Conjunctiva/sclera: Conjunctivae normal.  Cardiovascular:     Rate and Rhythm: Normal rate and regular rhythm.     Heart sounds: Normal heart sounds.  Pulmonary:     Effort: Pulmonary effort is normal.     Breath sounds: Normal breath sounds.  Abdominal:     General: Bowel sounds are normal.     Palpations: Abdomen  is soft.  Musculoskeletal:     Cervical back: Normal range of motion.  Lymphadenopathy:     Cervical: No cervical adenopathy.  Skin:    General: Skin is warm and dry.     Capillary Refill: Capillary refill takes less than 2 seconds.  Neurological:     Mental Status: She is alert and oriented to person, place, and time.  Psychiatric:        Behavior: Behavior normal.      Musculoskeletal Exam: C-spine, thoracic spine, lumbar spine and good range of motion.  Shoulder joints have good range of motion with some discomfort and stiffness bilaterally.  Elbow joints have good range of motion with no tenderness along the joint line currently.  Tenderness over both wrist joints.  No tenderness or synovitis over MCP or PIP joints.  Complete fist formation bilaterally.  Some PIP and DIP thickening consistent with osteoarthritis of both hands.  Hip joints have good range of motion with no groin pain.  Knee joints have good range of motion with no warmth or effusion.  Discomfort range of motion of both ankle joints, right greater than left.  No tenderness or swelling along the ankle joint line.  No tenderness or synovitis over MTP joints.  CDAI Exam: CDAI Score: -- Patient Global: --; Provider Global: -- Swollen: --; Tender: -- Joint Exam 06/22/2023   No joint exam has been documented for this visit   There is currently no information documented on the homunculus. Go to the Rheumatology activity and complete the homunculus joint exam.  Investigation: No additional findings.  Imaging: No results found.  Recent Labs: Lab Results  Component Value Date   WBC 6.0 03/15/2023   HGB 14.0 03/15/2023   PLT 317 03/15/2023   NA 143 03/18/2023   K 3.8 03/18/2023   CL 107 03/18/2023   CO2 26 03/18/2023   GLUCOSE 119 (H) 03/18/2023   BUN 15 03/18/2023   CREATININE 0.66 03/18/2023   BILITOT 0.3 03/18/2023   ALKPHOS 46 03/18/2023   AST 19 03/18/2023  ALT 11 03/18/2023   PROT 7.1 03/18/2023    ALBUMIN 4.1 03/18/2023   CALCIUM  9.7 03/18/2023   GFRAA 108 04/22/2020   QFTBGOLDPLUS NEGATIVE 04/05/2019    Speciality Comments: No specialty comments available.  Procedures:  No procedures performed Allergies: Pentazocine lactate, Tape, Demerol [meperidine], Lisinopril , and Nystatin    Assessment / Plan:     Visit Diagnoses: Rheumatoid arthritis involving multiple sites with positive rheumatoid factor (HCC) - +RF: Patient presents today experiencing exacerbation of pain involving both wrist joints and both ankle joints x1 week.  Patient has been more active performing yard work on a regular basis which she feels may exacerbated her symptoms.  She has been having painful range of motion of both ankle joints especially the right ankle.  Several days ago she was walking with a limp to the discomfort in her right ankle but her symptoms have started to ease off.  She tried taking Tylenol  for symptomatic relief.  She denies any joint swelling at this time.  No synovitis was noted on examination today.  She has some tenderness over both wrist joints but no tenderness along the ankle joint line at this time.  She is taking Arava  20 mg 1 tablet by mouth daily.  She has not had any recent gaps in therapy.  Plan to check sed rate and CRP today along with CBC and CMP. Plan to try to avoid a prednisone  taper due to history of diabetes.  Discussed the use of topical agents for symptomatic relief.  She can also try using a compression brace on the right ankle especially when walking on uneven terrain performing yard work. She will remain on arava  as prescribed.  She was advised to notify us  if she starts to have recurrent flares.- Plan: Sedimentation rate, C-reactive protein  High risk medication use - Arava  20 mg 1 tablet by mouth daily.  (Previously on Methotrexate  0.8 ml every 7 days and folic acid  1 mg 2 tablets daily.)  CBC updated on 03/15/23. CMP updated on 2/282/5.  Orders for CBC and CMP released today.    Discussed the importance of holding arava  if she develops signs or symptoms of an infection and to resume once the infection has completely cleared.   - Plan: CBC with Differential/Platelet, Comprehensive metabolic panel with GFR  ILD (interstitial lung disease) (HCC): ILD (interstitial lung disease) (HCC): No new or worsening pulmonary symptoms.   Mild pulmonary fibrosis in an early "indeterminate for UIP" pattern.  Diagnosed on the basis of high-resolution CT. On 09/26/19. High-resolution chest CT updated on 07/29/2022: Minimal subpleural reticular densities and traction bronchiolectasis in the lateral aspects of the mid and lower lung zones, grossly stable from 09/26/2019. Findings may be due to nonspecific interstitial pneumonitis Patient was evaluated by pulmonology Dr. Viva Grise on 10/27/2022-reviewed OV note.  No evidence of progression.  Recommended yearly PFT.   History of asthma - Diagnosed with asthma by Dr. Viva Grise.  Sicca syndrome [facility]): Patient has chronic dry eyes and has been using Restasis  for symptomatic relief.  Primary osteoarthritis of both knees: She has good range of motion of both knee joints on examination today.  No warmth or effusion noted.  Primary osteoarthritis of both feet: Patient presents today with increased pain in both ankle joints, right greater than left.  She has been performing yard work walking on uneven terrain which she feels exacerbated her ankle pain.  Several days ago she was having some difficulty ambulating due to severity of pain in her right ankle  which has since started to improve.  On examination today she had no tenderness along the joint line but had pain with range of motion.  No warmth or swelling of the ankle joints were noted.  Plan to check sed rate and CRP today.  Discussed the use of an ankle compression brace or wrap as well as the use of topical agents for symptomatic relief.  She will notify us  if the symptoms persist or  worsen.  Psoriasis: Not currently symptomatic.  Other medical conditions are listed as follows:  History of hyperlipidemia  History of hypothyroidism  Malignant neoplasm of lower-outer quadrant of right breast of female, estrogen receptor positive (HCC)  History of migraine  History of gastroesophageal reflux (GERD)  Orders: Orders Placed This Encounter  Procedures   CBC with Differential/Platelet   Comprehensive metabolic panel with GFR   Sedimentation rate   C-reactive protein   No orders of the defined types were placed in this encounter.    Follow-Up Instructions: No follow-ups on file.   Romayne Clubs, PA-C  Note - This record has been created using Dragon software.  Chart creation errors have been sought, but may not always  have been located. Such creation errors do not reflect on  the standard of medical care.

## 2023-06-13 ENCOUNTER — Other Ambulatory Visit: Payer: Self-pay | Admitting: Family Medicine

## 2023-06-13 DIAGNOSIS — E039 Hypothyroidism, unspecified: Secondary | ICD-10-CM

## 2023-06-14 ENCOUNTER — Encounter: Payer: Self-pay | Admitting: Pharmacist

## 2023-06-14 NOTE — Progress Notes (Signed)
 Pharmacy Quality Measure Review  This patient is appearing on a report for being at risk of failing the adherence measure for diabetes medications this calendar year.   Medication: Ozempic  0.25/0.5 Last fill date: 05/02/2023 for 56 day supply. 1 additional refill remaining.   Insurance report was not up to date. No action needed at this time.  Next refill date due ~June 10th

## 2023-06-15 ENCOUNTER — Other Ambulatory Visit: Payer: Self-pay | Admitting: Family Medicine

## 2023-06-15 DIAGNOSIS — K219 Gastro-esophageal reflux disease without esophagitis: Secondary | ICD-10-CM

## 2023-06-15 DIAGNOSIS — E1159 Type 2 diabetes mellitus with other circulatory complications: Secondary | ICD-10-CM

## 2023-06-16 DIAGNOSIS — C50911 Malignant neoplasm of unspecified site of right female breast: Secondary | ICD-10-CM | POA: Diagnosis not present

## 2023-06-17 DIAGNOSIS — L7682 Other postprocedural complications of skin and subcutaneous tissue: Secondary | ICD-10-CM | POA: Diagnosis not present

## 2023-06-17 DIAGNOSIS — Z853 Personal history of malignant neoplasm of breast: Secondary | ICD-10-CM | POA: Diagnosis not present

## 2023-06-20 ENCOUNTER — Ambulatory Visit: Payer: Medicare HMO | Admitting: Family Medicine

## 2023-06-22 ENCOUNTER — Ambulatory Visit: Attending: Physician Assistant | Admitting: Physician Assistant

## 2023-06-22 ENCOUNTER — Encounter: Payer: Self-pay | Admitting: Physician Assistant

## 2023-06-22 VITALS — BP 132/82 | HR 89 | Resp 16 | Ht 68.0 in | Wt 200.0 lb

## 2023-06-22 DIAGNOSIS — J849 Interstitial pulmonary disease, unspecified: Secondary | ICD-10-CM | POA: Diagnosis not present

## 2023-06-22 DIAGNOSIS — Z8709 Personal history of other diseases of the respiratory system: Secondary | ICD-10-CM | POA: Diagnosis not present

## 2023-06-22 DIAGNOSIS — L409 Psoriasis, unspecified: Secondary | ICD-10-CM | POA: Diagnosis not present

## 2023-06-22 DIAGNOSIS — Z8639 Personal history of other endocrine, nutritional and metabolic disease: Secondary | ICD-10-CM | POA: Diagnosis not present

## 2023-06-22 DIAGNOSIS — Z8669 Personal history of other diseases of the nervous system and sense organs: Secondary | ICD-10-CM | POA: Diagnosis not present

## 2023-06-22 DIAGNOSIS — M0579 Rheumatoid arthritis with rheumatoid factor of multiple sites without organ or systems involvement: Secondary | ICD-10-CM

## 2023-06-22 DIAGNOSIS — Z8719 Personal history of other diseases of the digestive system: Secondary | ICD-10-CM

## 2023-06-22 DIAGNOSIS — M170 Bilateral primary osteoarthritis of knee: Secondary | ICD-10-CM

## 2023-06-22 DIAGNOSIS — M19071 Primary osteoarthritis, right ankle and foot: Secondary | ICD-10-CM | POA: Diagnosis not present

## 2023-06-22 DIAGNOSIS — C50511 Malignant neoplasm of lower-outer quadrant of right female breast: Secondary | ICD-10-CM | POA: Diagnosis not present

## 2023-06-22 DIAGNOSIS — Z79899 Other long term (current) drug therapy: Secondary | ICD-10-CM

## 2023-06-22 DIAGNOSIS — M19072 Primary osteoarthritis, left ankle and foot: Secondary | ICD-10-CM

## 2023-06-22 DIAGNOSIS — M3500 Sicca syndrome, unspecified: Secondary | ICD-10-CM

## 2023-06-22 DIAGNOSIS — Z170 Estrogen receptor positive status [ER+]: Secondary | ICD-10-CM

## 2023-06-23 ENCOUNTER — Ambulatory Visit: Payer: Self-pay | Admitting: Physician Assistant

## 2023-06-23 LAB — C-REACTIVE PROTEIN: CRP: 4.2 mg/L (ref <8.0)

## 2023-06-23 LAB — CBC WITH DIFFERENTIAL/PLATELET
Absolute Lymphocytes: 2223 {cells}/uL (ref 850–3900)
Absolute Monocytes: 573 {cells}/uL (ref 200–950)
Basophils Absolute: 61 {cells}/uL (ref 0–200)
Basophils Relative: 1.3 %
Eosinophils Absolute: 202 {cells}/uL (ref 15–500)
Eosinophils Relative: 4.3 %
HCT: 40 % (ref 35.0–45.0)
Hemoglobin: 12.7 g/dL (ref 11.7–15.5)
MCH: 29.7 pg (ref 27.0–33.0)
MCHC: 31.8 g/dL — ABNORMAL LOW (ref 32.0–36.0)
MCV: 93.5 fL (ref 80.0–100.0)
MPV: 10.2 fL (ref 7.5–12.5)
Monocytes Relative: 12.2 %
Neutro Abs: 1640 {cells}/uL (ref 1500–7800)
Neutrophils Relative %: 34.9 %
Platelets: 260 10*3/uL (ref 140–400)
RBC: 4.28 10*6/uL (ref 3.80–5.10)
RDW: 13.6 % (ref 11.0–15.0)
Total Lymphocyte: 47.3 %
WBC: 4.7 10*3/uL (ref 3.8–10.8)

## 2023-06-23 LAB — COMPREHENSIVE METABOLIC PANEL WITH GFR
AG Ratio: 1.6 (calc) (ref 1.0–2.5)
ALT: 15 U/L (ref 6–29)
AST: 20 U/L (ref 10–35)
Albumin: 4.3 g/dL (ref 3.6–5.1)
Alkaline phosphatase (APISO): 43 U/L (ref 37–153)
BUN/Creatinine Ratio: 26 (calc) — ABNORMAL HIGH (ref 6–22)
BUN: 12 mg/dL (ref 7–25)
CO2: 25 mmol/L (ref 20–32)
Calcium: 9.6 mg/dL (ref 8.6–10.4)
Chloride: 107 mmol/L (ref 98–110)
Creat: 0.47 mg/dL — ABNORMAL LOW (ref 0.50–1.05)
Globulin: 2.7 g/dL (ref 1.9–3.7)
Glucose, Bld: 126 mg/dL — ABNORMAL HIGH (ref 65–99)
Potassium: 3.6 mmol/L (ref 3.5–5.3)
Sodium: 142 mmol/L (ref 135–146)
Total Bilirubin: 0.3 mg/dL (ref 0.2–1.2)
Total Protein: 7 g/dL (ref 6.1–8.1)
eGFR: 103 mL/min/{1.73_m2} (ref >=60)

## 2023-06-23 LAB — SEDIMENTATION RATE: Sed Rate: 6 mm/h (ref 0–30)

## 2023-06-23 NOTE — Progress Notes (Signed)
 CBC  WNL Glucose is 126. Rest of CMP stable.  ESR and CRP WNL

## 2023-06-24 ENCOUNTER — Telehealth: Payer: Self-pay | Admitting: *Deleted

## 2023-06-24 ENCOUNTER — Other Ambulatory Visit: Payer: Self-pay | Admitting: Physician Assistant

## 2023-06-24 DIAGNOSIS — M25571 Pain in right ankle and joints of right foot: Secondary | ICD-10-CM | POA: Diagnosis not present

## 2023-06-24 DIAGNOSIS — E663 Overweight: Secondary | ICD-10-CM | POA: Diagnosis not present

## 2023-06-24 DIAGNOSIS — M0579 Rheumatoid arthritis with rheumatoid factor of multiple sites without organ or systems involvement: Secondary | ICD-10-CM

## 2023-06-24 DIAGNOSIS — Z6829 Body mass index (BMI) 29.0-29.9, adult: Secondary | ICD-10-CM | POA: Diagnosis not present

## 2023-06-24 NOTE — Telephone Encounter (Signed)
 Inflammatory markers were within normal limits.  Since she is having difficulty bearing weight x-ray should be obtained--recommend evaluation at orthopedic urgent care or another orthopedic clinic for further evaluation.

## 2023-06-24 NOTE — Telephone Encounter (Signed)
 Patient contacted the office stating when she saw you the other day she was having pain in her right ankle. Patient states she is now unable to walk on it or put pressure on it. Patient states she has it wrapped in an ace bandage. Patient states she has noticed some increased swelling. Patient states it is not hot to touch, no redness and no bruising. Please advise.

## 2023-06-24 NOTE — Telephone Encounter (Signed)
 Patient advised inflammatory markers were within normal limits.  Since she is having difficulty bearing weight x-ray should be obtained--recommend evaluation at orthopedic urgent care or another orthopedic clinic for further evaluation.

## 2023-06-27 ENCOUNTER — Encounter

## 2023-06-27 NOTE — Telephone Encounter (Signed)
 Last Fill: 03/31/2023  Labs: 06/22/2023 CBC  WNL Glucose is 126. Rest of CMP stable.  Next Visit: 11/10/2023  Last Visit: 06/22/2023  DX:  Rheumatoid arthritis involving multiple sites with positive rheumatoid factor   Current Dose per office note 06/22/2023: Arava  20 mg 1 tablet by mouth daily.   Okay to refill Arava  ?

## 2023-06-30 ENCOUNTER — Ambulatory Visit: Payer: PPO | Admitting: Rheumatology

## 2023-07-04 ENCOUNTER — Other Ambulatory Visit: Payer: Self-pay | Admitting: Family Medicine

## 2023-07-04 DIAGNOSIS — E039 Hypothyroidism, unspecified: Secondary | ICD-10-CM

## 2023-07-05 DIAGNOSIS — C50911 Malignant neoplasm of unspecified site of right female breast: Secondary | ICD-10-CM | POA: Diagnosis not present

## 2023-07-06 DIAGNOSIS — Z961 Presence of intraocular lens: Secondary | ICD-10-CM | POA: Diagnosis not present

## 2023-07-06 DIAGNOSIS — E119 Type 2 diabetes mellitus without complications: Secondary | ICD-10-CM | POA: Diagnosis not present

## 2023-07-06 LAB — HM DIABETES EYE EXAM

## 2023-07-15 DIAGNOSIS — Z853 Personal history of malignant neoplasm of breast: Secondary | ICD-10-CM | POA: Diagnosis not present

## 2023-08-10 ENCOUNTER — Other Ambulatory Visit: Payer: Self-pay

## 2023-08-10 DIAGNOSIS — E039 Hypothyroidism, unspecified: Secondary | ICD-10-CM

## 2023-08-10 MED ORDER — LEVOTHYROXINE SODIUM 100 MCG PO TABS: ORAL_TABLET | ORAL | 0 refills | Status: DC

## 2023-08-12 ENCOUNTER — Ambulatory Visit: Payer: Self-pay | Admitting: Surgery

## 2023-08-12 ENCOUNTER — Ambulatory Visit: Payer: Self-pay

## 2023-08-12 DIAGNOSIS — Z853 Personal history of malignant neoplasm of breast: Secondary | ICD-10-CM | POA: Diagnosis not present

## 2023-08-12 DIAGNOSIS — T8189XS Other complications of procedures, not elsewhere classified, sequela: Secondary | ICD-10-CM | POA: Diagnosis not present

## 2023-08-12 NOTE — Telephone Encounter (Signed)
 Attempt to contact patient x 2. LVM, will attempt to contact patient at a later time.

## 2023-08-12 NOTE — Telephone Encounter (Signed)
 FYI Only or Action Required?: FYI only for provider.  Patient was last seen in primary care on 04/25/2023 by Hope Merle, MD.  Called Nurse Triage reporting Diarrhea.  Symptoms began about a month ago.  Interventions attempted: Rest, hydration, or home remedies.  Symptoms are: unchanged.  Triage Disposition: See HCP Within 4 Hours (Or PCP Triage)  Patient/caregiver understands and will follow disposition?: Yes  **Patient agrees to be seen in UC as she will need to attend her TOC/New Patient appointment on 12/11**             Reason for Disposition  [1] SEVERE diarrhea (e.g., 7 or more times / day more than normal) AND [2] age > 60 years  Answer Assessment - Initial Assessment Questions 1. DIARRHEA SEVERITY: How bad is the diarrhea? How many more stools have you had in the past 24 hours than normal?      Several   2. ONSET: When did the diarrhea begin?      X 1 month   3. STOOL DESCRIPTION:  How loose or watery is the diarrhea? What is the stool color? Is there any blood or mucous in the stool?     Loose, watery, undigested food   4. VOMITING: Are you also vomiting? If Yes, ask: How many times in the past 24 hours?      No   5. ABDOMEN PAIN: Are you having any abdomen pain? If Yes, ask: What does it feel like? (e.g., crampy, dull, intermittent, constant)      No pain   8. HYDRATION: Any signs of dehydration? (e.g., dry mouth [not just dry lips], too weak to stand, dizziness, new weight loss) When did you last urinate?  No       9. EXPOSURE: Have you traveled to a foreign country recently? Have you been exposed to anyone with diarrhea? Could you have eaten any food that was spoiled?     No    10. ANTIBIOTIC USE: Are you taking antibiotics now or have you taken antibiotics in the past 2 months?       No    11. OTHER SYMPTOMS: Do you have any other symptoms? (e.g., fever, blood in stool) No   For home care she has not taken  anything OTC.  Protocols used: [facility]

## 2023-08-12 NOTE — H&P (Signed)
 Subjective    History of Present Illness: Cheryl Knight is a 68 y.o. female who is seen today for another wound check .    This is a 69 year old female who was diagnosed in 02/2019 with Grade 1 invasive ductal carcinoma of the right breast.  Her tumor was located at 0700. ER/PR positive, her 2 negative.  She underwent right RSL/ SLNB on 03/28/19.  Margins were negative/ SLNx 2 negative.  She received radiation therapy and is currently on Tamoxifen .     Recently she had a mammogram that revealed a focal asymmetry in the right breast at 12:00.  Work-up revealed a 1.4 x 0.8 x 1.4 cm mass 5-6 cmfn.  Biopsy revealed DCIS high-grade/ ER positive/ PR negative with suspicion for microinvasion.  After thorough discussion with the patient, she opted for right mastectomy with sentinel lymph node biopsy on 09/14/2022.     Pathology confirmed invasive ductal carcinoma 2 x 1.5 x 1.3 cm with negative margins.  1 sentinel lymph node was negative.  She has not yet seen oncology for follow-up. Her drain was removed on 09/23/2022.  The patient developed a persistent seroma that required multiple aspirations.  On 12/13/2022, we returned to the operating room where I evacuated the right chest seroma placed a Penrose drain.  The Penrose drain was removed on 01/03/2023.  Most of the seroma cavity is closed but she has persistent drainage from a 1 cm opening.  Despite multiple attempts to cauterized with silver nitrate, the drainage persist.    Medical History: Past Medical History:  Diagnosis Date   Arthritis    Diabetes mellitus without complication (CMS/HHS-HCC)    GERD (gastroesophageal reflux disease)    History of cancer    Hyperlipidemia    Hypertension    Thyroid  disease     Patient Active Problem List  Diagnosis   Aortic atherosclerosis ()   Basal cell carcinoma of chest wall   Bronchiectasis without complication (CMS/HHS-HCC)   Chronic back pain   Esophageal reflux   Fatty liver   Glaucoma    Hyperlipidemia   Hypertension associated with diabetes (CMS/HHS-HCC)   ILD (interstitial lung disease) (CMS/HHS-HCC)   Malignant neoplasm of lower-outer quadrant of right breast of female, estrogen receptor positive (CMS/HHS-HCC)   Obesity (BMI 30-39.9), unspecified   Migraine   Osteoarthritis   Pulmonary fibrosis (CMS/HHS-HCC)   Rheumatoid arthritis (CMS/HHS-HCC)   Type 2 diabetes mellitus with hyperglycemia, without long-term current use of insulin  (CMS/HHS-HCC)   History of right breast cancer   Ductal carcinoma in situ (DCIS) of right breast   Invasive ductal carcinoma of breast, female, right (CMS/HHS-HCC)    Past Surgical History:  Procedure Laterality Date   COLONOSCOPY  08/14/2019   Tubular adenoma/Repeat 75yrs/TKT   EGD  08/14/2019   Gastritis/Esophagitis/No Repeat/TKT   CHOLECYSTECTOMY  10/2019   robotic assited laparoscopic by isami sakai   DRAINAGE OF RIGHT CHEST WALL SEROMA  12/13/2022   Dr Belinda     Allergies  Allergen Reactions   Demerol [Meperidine] Nausea and Vomiting   Lisinopril  Cough   Tylox [Oxycodone -Acetaminophen ] Hallucination    Current Outpatient Medications on File Prior to Visit  Medication Sig Dispense Refill   albuterol  90 mcg/actuation inhaler INHALE 1 TO 2 PUFFS INTO THE LUNGS EVERY 6 HOURS AS NEEDED FOR WHEEZING OR SHORTNESS OF BREATH     betamethasone  dipropionate (DIPROSONE ) 0.05 % cream Apply topically 2 (two) times daily     budesonide -formoteroL  (SYMBICORT ) 160-4.5 mcg/actuation inhaler  calcium  carbonate-vitamin D3 (CALTRATE 600+D) 600 mg(1,500mg ) -400 unit Chew chewable tablet Take 1 tablet by mouth once daily        cetirizine (ZYRTEC) 10 MG tablet Take 10 mg by mouth once daily     cyclobenzaprine  (FLEXERIL ) 10 MG tablet Take 10 mg by mouth once daily     docosahexaenoic acid-epa 120-180 mg Cap Take 1 capsule by mouth once daily     empagliflozin  (JARDIANCE ) 10 mg tablet Take 10 mg by mouth every morning before breakfast      esomeprazole  (NEXIUM ) 40 MG DR capsule Take 1 capsule (40 mg total) by mouth once daily 90 capsule 2   famotidine  (PEPCID ) 20 MG tablet Take 20 mg by mouth 2 (two) times daily     fluticasone  propionate (FLONASE ) 50 mcg/actuation nasal spray Place 2 sprays into both nostrils once daily     glucosamine su 2KCl-chondroit 500-400 mg Tab Take 1 tablet by mouth 3 (three) times a day        HYDROcodone -acetaminophen  (NORCO) 5-325 mg tablet      leflunomide  (ARAVA ) 20 MG tablet Take 20 mg by mouth once daily        levothyroxine  (SYNTHROID ) 100 MCG tablet Take 100 mcg by mouth once daily        MIEBO , PF, 100 % Drop Apply to eye     nystatin  (MYCOSTATIN ) 100,000 unit/mL suspension      olmesartan -hydrochlorothiazide  (BENICAR  HCT) 20-12.5 mg tablet Take 1 tablet by mouth every morning     pilocarpine  (SALAGEN ) 5 mg tablet Take 5 mg by mouth 3 (three) times daily     pravastatin  (PRAVACHOL ) 40 MG tablet Take 20 mg by mouth at bedtime     promethazine  (PHENERGAN ) 25 MG tablet Take 25 mg by mouth every 6 (six) hours as needed for Nausea     RESTASIS  0.05 % ophthalmic emulsion Place 1 drop into both eyes 2 (two) times daily     semaglutide  (OZEMPIC ) 0.25 mg or 0.5 mg(2 mg/1.5 mL) pen injector Inject 0.25 mg subcutaneously once a week     simvastatin  (ZOCOR ) 40 MG tablet Take by mouth     SUMAtriptan  (IMITREX ) 50 MG tablet TAKE 1 TABLET BY MOUTH AS NEEDED FOR HEADACHE     tamoxifen  (NOLVADEX ) 20 MG tablet        topiramate  (TOPAMAX ) 100 MG tablet Take 100 mg by mouth once daily        traMADoL  (ULTRAM ) 50 mg tablet      No current facility-administered medications on file prior to visit.    Family History  Problem Relation Age of Onset   Skin cancer Mother    Obesity Mother    High blood pressure (Hypertension) Mother    Hyperlipidemia (Elevated cholesterol) Mother    Diabetes Mother    Breast cancer Mother      Social History   Tobacco Use  Smoking Status Former   Types: Cigarettes   Smokeless Tobacco Never     Social History   Socioeconomic History   Marital status: Married  Tobacco Use   Smoking status: Former    Types: Cigarettes   Smokeless tobacco: Never  Vaping Use   Vaping status: Never Used  Substance and Sexual Activity   Alcohol  use: Yes   Drug use: Never   Social Drivers of Corporate investment banker Strain: Low Risk  (04/21/2023)   Received from [facility]   Overall Financial Resource Strain (CARDIA)    Difficulty of Paying  Living Expenses: Not hard at all  Food Insecurity: No Food Insecurity (04/21/2023)   Received from [facility]   Hunger Vital Sign    Within the past 12 months, you worried that your food would run out before you got the money to buy more.: Never true    Within the past 12 months, the food you bought just didn't last and you didn't have money to get more.: Never true  Transportation Needs: No Transportation Needs (04/21/2023)   Received from [facility] - Transportation    Lack of Transportation (Medical): No    Lack of Transportation (Non-Medical): No  Physical Activity: Insufficiently Active (04/21/2023)   Received from [facility]   Exercise Vital Sign    On average, how many days per week do you engage in moderate to strenuous exercise (like a brisk walk)?: 2 days    On average, how many minutes do you engage in exercise at this level?: 20 min  Stress: No Stress Concern Present (04/21/2023)   Received from [facility] - Occupational Stress Questionnaire    Feeling of Stress : Not at all  Social Connections: Moderately Integrated (04/21/2023)   Received from [facility]   Social Connection and Isolation Panel    In a typical week, how many times do you talk on the phone with family, friends, or neighbors?: Twice a week    How often do you get together with friends or relatives?: Once a week    How often do you attend church or religious services?: Never    Do you belong  to any clubs or organizations such as church groups, unions, fraternal or athletic groups, or school groups?: Yes    How often do you attend meetings of the clubs or organizations you belong to?: 1 to 4 times per year    Are you married, widowed, divorced, separated, never married, or living with a partner?: Married  Housing Stability: Unknown (01/31/2023)   Housing Stability Vital Sign    Homeless in the Last Year: No    Objective:    Vitals:   08/12/23 1046  PainSc: 0-No pain    Physical Exam   Constitutional:  WDWN in NAD, conversant, no obvious deformities; lying in bed comfortably Eyes:  Pupils equal, round; sclera anicteric; moist conjunctiva; no lid lag HENT:  Oral mucosa moist; good dentition  Neck:  No masses palpated, trachea midline; no thyromegaly Lungs:  CTA bilaterally; normal respiratory effort Breasts: The right chest incision is intact and almost completely healed.  In the central portion of the incision, there is very thick scar tissue below the incision.  This creates a crevice.  Deep within the crevice there is a 1 cm opening in the skin.  There is visible granulation tissue in this area.  I explored the wound with a cotton swab.  It measures approximately 2 x 1 cm. CV:  Regular rate and rhythm; no murmurs; extremities well-perfused with no edema Abd:  +bowel sounds, soft, non-tender, no palpable organomegaly; no palpable hernias Musc: Normal gait; no apparent clubbing or cyanosis in extremities Lymphatic:  No palpable cervical or axillary lymphadenopathy Skin:  Warm, dry; no sign of jaundice Psychiatric - alert and oriented x 4; calm mood and affect    Assessment and Plan:  Diagnoses and all orders for this visit:  History of right breast cancer  Nonhealing surgical wound, sequela     The chronic wound does not  seem to be healing any further at this time.  I discussed the situation with the patient and her husband.  I recommended returning to the  operating room for localized wound exploration under anesthesia.  We will excise the thick scar tissue inferior to the wound as well as some of the chronic granulation tissue in the wound.  Hopefully excising the scar tissue back to healthy tissue will aid in faster healing.  They are in agreement with this plan and wished to proceed.  Wound exploration right chest wall.The surgical procedure has been discussed with the patient.  Potential risks, benefits, alternative treatments, and expected outcomes have been explained.  All of the patient's questions at this time have been answered.  The likelihood of reaching the patient's treatment goal is good.  The patient understands the proposed surgical procedure and wishes to proceed.  DONNICE DEWAYNE LIMA, MD  08/12/2023 12:04 PM

## 2023-08-12 NOTE — Telephone Encounter (Signed)
 Attempt to contact patient x 1, LVM, will attempt to contact patient at a later time.                          Message from [place] E sent at 08/12/2023  2:43 PM EDT  Reason for Triage: Diarrhea. Patient was started on Semaglutide ,0.25 or 0.5MG /DOS, 2 MG/3ML SOPN and when her dose got increased she started having diarrhea. Been going on for several months but has gotten worse within the past month. Callback number [phone].

## 2023-08-21 DIAGNOSIS — R197 Diarrhea, unspecified: Secondary | ICD-10-CM | POA: Diagnosis not present

## 2023-08-22 ENCOUNTER — Telehealth: Payer: Self-pay

## 2023-08-22 DIAGNOSIS — M0579 Rheumatoid arthritis with rheumatoid factor of multiple sites without organ or systems involvement: Secondary | ICD-10-CM

## 2023-08-22 DIAGNOSIS — M25552 Pain in left hip: Secondary | ICD-10-CM

## 2023-08-22 NOTE — Telephone Encounter (Signed)
 Patient called the office back stating she is having left leg, hip/ball joint pain and she can't seem to get rid of it. Patient has been laying on a heat pad as well as a muscle relaxer but will not go away. Patient says it eases when she is moving around but when trying to sleep at night it returns and would like to know if there is anything that could be recommended. Please advise.

## 2023-08-22 NOTE — Telephone Encounter (Signed)
 Patient called the office stating that she has a upcoming surgery an wanted to know if she should hold her Arava . Advised patient that she should hold one prior to surgery and two weeks after surgery. Patient verbalized understanding and also confirmed when her next appointment would be.

## 2023-08-22 NOTE — Addendum Note (Signed)
 Addended by: Roland Lipke M on: 08/22/2023 12:50 PM   Modules accepted: Orders

## 2023-08-22 NOTE — Telephone Encounter (Signed)
 I would recommend an urgent evaluation at orthopedics--ok to place referral if needed.

## 2023-08-23 ENCOUNTER — Other Ambulatory Visit: Payer: Self-pay

## 2023-08-23 DIAGNOSIS — M5416 Radiculopathy, lumbar region: Secondary | ICD-10-CM | POA: Diagnosis not present

## 2023-08-23 NOTE — Telephone Encounter (Signed)
 Lvm for PT to call back regarding the medication request.

## 2023-08-23 NOTE — Telephone Encounter (Signed)
 This was last prescribed 10/2022. This is not typically a medication you stay on. Is she having acute issues, if so, needs to be seen.

## 2023-08-24 NOTE — Telephone Encounter (Signed)
 Noted

## 2023-08-25 DIAGNOSIS — M545 Low back pain, unspecified: Secondary | ICD-10-CM | POA: Diagnosis not present

## 2023-08-25 DIAGNOSIS — C50911 Malignant neoplasm of unspecified site of right female breast: Secondary | ICD-10-CM | POA: Diagnosis not present

## 2023-08-25 DIAGNOSIS — M5416 Radiculopathy, lumbar region: Secondary | ICD-10-CM | POA: Diagnosis not present

## 2023-08-29 DIAGNOSIS — Z1231 Encounter for screening mammogram for malignant neoplasm of breast: Secondary | ICD-10-CM | POA: Diagnosis not present

## 2023-08-29 LAB — HM MAMMOGRAPHY

## 2023-09-06 ENCOUNTER — Inpatient Hospital Stay: Payer: PPO | Admitting: Nurse Practitioner

## 2023-09-06 ENCOUNTER — Inpatient Hospital Stay: Payer: PPO

## 2023-09-07 ENCOUNTER — Other Ambulatory Visit: Payer: Self-pay

## 2023-09-07 ENCOUNTER — Encounter (HOSPITAL_BASED_OUTPATIENT_CLINIC_OR_DEPARTMENT_OTHER): Payer: Self-pay | Admitting: Surgery

## 2023-09-07 NOTE — Progress Notes (Signed)
 Chart reviewed with Dr. Cleotilde, patient has history of trach, with history of difficult intubation. Ok to have surgery at [facility].

## 2023-09-08 ENCOUNTER — Encounter (HOSPITAL_BASED_OUTPATIENT_CLINIC_OR_DEPARTMENT_OTHER)
Admission: RE | Admit: 2023-09-08 | Discharge: 2023-09-08 | Disposition: A | Discharge status: Home / Self Care | Source: Ambulatory Visit | Attending: Surgery | Admitting: Surgery

## 2023-09-08 DIAGNOSIS — Z01818 Encounter for other preprocedural examination: Secondary | ICD-10-CM | POA: Diagnosis not present

## 2023-09-08 LAB — BASIC METABOLIC PANEL WITH GFR
Anion gap: 16 — ABNORMAL HIGH (ref 5–15)
BUN: 10 mg/dL (ref 8–23)
CO2: 23 mmol/L (ref 22–32)
Calcium: 9.5 mg/dL (ref 8.9–10.3)
Chloride: 105 mmol/L (ref 98–111)
Creatinine, Ser: 0.61 mg/dL (ref 0.44–1.00)
GFR, Estimated: >60 mL/min (ref >60)
Glucose, Bld: 104 mg/dL — ABNORMAL HIGH (ref 70–99)
Potassium: 4 mmol/L (ref 3.5–5.1)
Sodium: 144 mmol/L (ref 135–145)

## 2023-09-08 MED ORDER — CHLORHEXIDINE GLUCONATE CLOTH 2 % EX PADS: 6.0000 | MEDICATED_PAD | Freq: Once | CUTANEOUS | Status: DC

## 2023-09-08 NOTE — Progress Notes (Signed)

## 2023-09-14 ENCOUNTER — Other Ambulatory Visit: Payer: Self-pay

## 2023-09-14 ENCOUNTER — Ambulatory Visit (HOSPITAL_BASED_OUTPATIENT_CLINIC_OR_DEPARTMENT_OTHER): Admitting: Anesthesiology

## 2023-09-14 ENCOUNTER — Encounter (HOSPITAL_BASED_OUTPATIENT_CLINIC_OR_DEPARTMENT_OTHER)
Admission: RE | Disposition: A | Discharge status: Home / Self Care | Payer: Self-pay | Source: Home / Self Care | Attending: Surgery

## 2023-09-14 ENCOUNTER — Ambulatory Visit (HOSPITAL_BASED_OUTPATIENT_CLINIC_OR_DEPARTMENT_OTHER)
Admission: RE | Admit: 2023-09-14 | Discharge: 2023-09-14 | Disposition: A | Discharge status: Home / Self Care | Attending: Surgery | Admitting: Surgery

## 2023-09-14 ENCOUNTER — Encounter (HOSPITAL_BASED_OUTPATIENT_CLINIC_OR_DEPARTMENT_OTHER): Payer: Self-pay | Admitting: Surgery

## 2023-09-14 DIAGNOSIS — E039 Hypothyroidism, unspecified: Secondary | ICD-10-CM

## 2023-09-14 DIAGNOSIS — M069 Rheumatoid arthritis, unspecified: Secondary | ICD-10-CM | POA: Diagnosis not present

## 2023-09-14 DIAGNOSIS — S21101A Unspecified open wound of right front wall of thorax without penetration into thoracic cavity, initial encounter: Secondary | ICD-10-CM | POA: Diagnosis not present

## 2023-09-14 DIAGNOSIS — I10 Essential (primary) hypertension: Secondary | ICD-10-CM | POA: Insufficient documentation

## 2023-09-14 DIAGNOSIS — T8189XS Other complications of procedures, not elsewhere classified, sequela: Secondary | ICD-10-CM | POA: Insufficient documentation

## 2023-09-14 DIAGNOSIS — Z9011 Acquired absence of right breast and nipple: Secondary | ICD-10-CM | POA: Diagnosis not present

## 2023-09-14 DIAGNOSIS — Z853 Personal history of malignant neoplasm of breast: Secondary | ICD-10-CM | POA: Insufficient documentation

## 2023-09-14 DIAGNOSIS — E119 Type 2 diabetes mellitus without complications: Secondary | ICD-10-CM | POA: Insufficient documentation

## 2023-09-14 DIAGNOSIS — Z7985 Long-term (current) use of injectable non-insulin antidiabetic drugs: Secondary | ICD-10-CM | POA: Diagnosis not present

## 2023-09-14 DIAGNOSIS — J8410 Pulmonary fibrosis, unspecified: Secondary | ICD-10-CM | POA: Diagnosis not present

## 2023-09-14 DIAGNOSIS — Z79899 Other long term (current) drug therapy: Secondary | ICD-10-CM | POA: Insufficient documentation

## 2023-09-14 DIAGNOSIS — S2190XA Unspecified open wound of unspecified part of thorax, initial encounter: Secondary | ICD-10-CM | POA: Diagnosis not present

## 2023-09-14 DIAGNOSIS — L929 Granulomatous disorder of the skin and subcutaneous tissue, unspecified: Secondary | ICD-10-CM | POA: Diagnosis not present

## 2023-09-14 DIAGNOSIS — E669 Obesity, unspecified: Secondary | ICD-10-CM | POA: Diagnosis not present

## 2023-09-14 DIAGNOSIS — Z01818 Encounter for other preprocedural examination: Secondary | ICD-10-CM

## 2023-09-14 DIAGNOSIS — K219 Gastro-esophageal reflux disease without esophagitis: Secondary | ICD-10-CM | POA: Insufficient documentation

## 2023-09-14 DIAGNOSIS — Z923 Personal history of irradiation: Secondary | ICD-10-CM | POA: Insufficient documentation

## 2023-09-14 DIAGNOSIS — Z7984 Long term (current) use of oral hypoglycemic drugs: Secondary | ICD-10-CM | POA: Diagnosis not present

## 2023-09-14 DIAGNOSIS — Z87891 Personal history of nicotine dependence: Secondary | ICD-10-CM

## 2023-09-14 DIAGNOSIS — L7682 Other postprocedural complications of skin and subcutaneous tissue: Secondary | ICD-10-CM | POA: Diagnosis not present

## 2023-09-14 DIAGNOSIS — L089 Local infection of the skin and subcutaneous tissue, unspecified: Secondary | ICD-10-CM | POA: Diagnosis not present

## 2023-09-14 HISTORY — PX: EXCISION OF BREAST LESION: SHX6676

## 2023-09-14 LAB — GLUCOSE, CAPILLARY
Glucose-Capillary: 106 mg/dL — ABNORMAL HIGH (ref 70–99)
Glucose-Capillary: 117 mg/dL — ABNORMAL HIGH (ref 70–99)

## 2023-09-14 SURGERY — EXCISION OF BREAST LESION
Anesthesia: General | Site: Chest | Laterality: Right

## 2023-09-14 MED ORDER — FENTANYL CITRATE (PF) 100 MCG/2ML IJ SOLN: 25.0000 ug | INTRAMUSCULAR | Status: DC | PRN

## 2023-09-14 MED ORDER — PHENYLEPHRINE HCL (PRESSORS) 10 MG/ML IV SOLN
INTRAVENOUS | Status: DC | PRN
  Administered 2023-09-14 (×4): 80 ug via INTRAVENOUS
  Administered 2023-09-14 (×2): 160 ug via INTRAVENOUS
  Administered 2023-09-14 (×2): 80 ug via INTRAVENOUS

## 2023-09-14 MED ORDER — LACTATED RINGERS IV SOLN: INTRAVENOUS | Status: DC

## 2023-09-14 MED ORDER — CEFAZOLIN SODIUM-DEXTROSE 2-4 GM/100ML-% IV SOLN
INTRAVENOUS | Status: AC
  Filled 2023-09-14: qty 100

## 2023-09-14 MED ORDER — SUCCINYLCHOLINE CHLORIDE 200 MG/10ML IV SOSY
PREFILLED_SYRINGE | INTRAVENOUS | Status: AC
  Filled 2023-09-14: qty 10

## 2023-09-14 MED ORDER — FENTANYL CITRATE (PF) 100 MCG/2ML IJ SOLN
INTRAMUSCULAR | Status: DC | PRN
  Administered 2023-09-14: 50 ug via INTRAVENOUS
  Administered 2023-09-14 (×2): 25 ug via INTRAVENOUS

## 2023-09-14 MED ORDER — 0.9 % SODIUM CHLORIDE (POUR BTL) OPTIME
TOPICAL | Status: DC | PRN
  Administered 2023-09-14: 500 mL

## 2023-09-14 MED ORDER — ONDANSETRON HCL 4 MG/2ML IJ SOLN
INTRAMUSCULAR | Status: AC
  Filled 2023-09-14: qty 2

## 2023-09-14 MED ORDER — ONDANSETRON HCL 4 MG/2ML IJ SOLN: 4.0000 mg | Freq: Once | INTRAMUSCULAR | Status: DC | PRN

## 2023-09-14 MED ORDER — BUPIVACAINE-EPINEPHRINE 0.25% -1:200000 IJ SOLN
INTRAMUSCULAR | Status: DC | PRN
  Administered 2023-09-14: 10 mL

## 2023-09-14 MED ORDER — FENTANYL CITRATE (PF) 100 MCG/2ML IJ SOLN
INTRAMUSCULAR | Status: AC
  Filled 2023-09-14: qty 2

## 2023-09-14 MED ORDER — MIDAZOLAM HCL 2 MG/2ML IJ SOLN
INTRAMUSCULAR | Status: AC
Start: 2023-09-14 — End: 2023-09-14
  Filled 2023-09-14: qty 2

## 2023-09-14 MED ORDER — ACETAMINOPHEN 500 MG PO TABS
1000.0000 mg | ORAL_TABLET | ORAL | Status: AC
  Administered 2023-09-14: 1000 mg via ORAL

## 2023-09-14 MED ORDER — DEXAMETHASONE SODIUM PHOSPHATE 10 MG/ML IJ SOLN
INTRAMUSCULAR | Status: AC
  Filled 2023-09-14: qty 1

## 2023-09-14 MED ORDER — PROPOFOL 10 MG/ML IV BOLUS
INTRAVENOUS | Status: DC | PRN
  Administered 2023-09-14: 170 mg via INTRAVENOUS

## 2023-09-14 MED ORDER — LIDOCAINE 2% (20 MG/ML) 5 ML SYRINGE
INTRAMUSCULAR | Status: DC | PRN
  Administered 2023-09-14: 80 mg via INTRAVENOUS

## 2023-09-14 MED ORDER — ACETAMINOPHEN 500 MG PO TABS
ORAL_TABLET | ORAL | Status: AC
  Filled 2023-09-14: qty 2

## 2023-09-14 MED ORDER — CEFAZOLIN SODIUM-DEXTROSE 2-4 GM/100ML-% IV SOLN
2.0000 g | INTRAVENOUS | Status: AC
  Administered 2023-09-14: 2 g via INTRAVENOUS

## 2023-09-14 MED ORDER — OXYCODONE HCL 5 MG PO TABS: 5.0000 mg | ORAL_TABLET | Freq: Four times a day (QID) | ORAL | 0 refills | Status: AC | PRN

## 2023-09-14 MED ORDER — LIDOCAINE 2% (20 MG/ML) 5 ML SYRINGE
INTRAMUSCULAR | Status: AC
  Filled 2023-09-14: qty 5

## 2023-09-14 MED ORDER — EPHEDRINE SULFATE (PRESSORS) 50 MG/ML IJ SOLN
INTRAMUSCULAR | Status: DC | PRN
  Administered 2023-09-14 (×3): 5 mg via INTRAVENOUS

## 2023-09-14 MED ORDER — KETOROLAC TROMETHAMINE 30 MG/ML IJ SOLN
INTRAMUSCULAR | Status: AC
  Filled 2023-09-14: qty 1

## 2023-09-14 MED ORDER — ONDANSETRON HCL 4 MG/2ML IJ SOLN
INTRAMUSCULAR | Status: DC | PRN
Start: 2023-09-14 — End: 2023-09-14
  Administered 2023-09-14: 4 mg via INTRAVENOUS

## 2023-09-14 MED ORDER — EPHEDRINE 5 MG/ML INJ
INTRAVENOUS | Status: AC
  Filled 2023-09-14: qty 5

## 2023-09-14 SURGICAL SUPPLY — 44 items
APPLICATOR COTTON TIP 6 STRL (MISCELLANEOUS) IMPLANT
BENZOIN TINCTURE PRP APPL 2/3 (GAUZE/BANDAGES/DRESSINGS) ×1 IMPLANT
BIOPATCH RED 1 DISK 7.0 (GAUZE/BANDAGES/DRESSINGS) IMPLANT
BLADE HEX COATED 2.75 (ELECTRODE) ×1 IMPLANT
BLADE SURG 15 STRL LF DISP TIS (BLADE) ×1 IMPLANT
CANISTER SUCT 1200ML W/VALVE (MISCELLANEOUS) ×1 IMPLANT
CHLORAPREP W/TINT 26 (MISCELLANEOUS) ×1 IMPLANT
CLIP APPLIE 9.375 MED OPEN (MISCELLANEOUS) IMPLANT
COVER BACK TABLE 60X90IN (DRAPES) ×1 IMPLANT
COVER MAYO STAND STRL (DRAPES) ×1 IMPLANT
DERMABOND ADVANCED .7 DNX12 (GAUZE/BANDAGES/DRESSINGS) IMPLANT
DRAIN CHANNEL 19F RND (DRAIN) IMPLANT
DRAPE LAPAROTOMY 100X72 PEDS (DRAPES) ×1 IMPLANT
DRAPE UTILITY XL STRL (DRAPES) ×1 IMPLANT
DRSG TEGADERM 2-3/8X2-3/4 SM (GAUZE/BANDAGES/DRESSINGS) IMPLANT
DRSG TEGADERM 4X4.75 (GAUZE/BANDAGES/DRESSINGS) ×1 IMPLANT
ELECTRODE REM PT RTRN 9FT ADLT (ELECTROSURGICAL) ×1 IMPLANT
EVACUATOR SILICONE 100CC (DRAIN) IMPLANT
GAUZE SPONGE 4X4 12PLY STRL LF (GAUZE/BANDAGES/DRESSINGS) ×1 IMPLANT
GLOVE BIO SURGEON STRL SZ7 (GLOVE) ×1 IMPLANT
GLOVE BIOGEL PI IND STRL 7.5 (GLOVE) ×1 IMPLANT
GOWN STRL REUS W/ TWL LRG LVL3 (GOWN DISPOSABLE) ×2 IMPLANT
KIT MARKER MARGIN INK (KITS) IMPLANT
NDL HYPO 25X1 1.5 SAFETY (NEEDLE) ×1 IMPLANT
NEEDLE HYPO 25X1 1.5 SAFETY (NEEDLE) ×1 IMPLANT
NS IRRIG 1000ML POUR BTL (IV SOLUTION) ×1 IMPLANT
PACK BASIN DAY SURGERY FS (CUSTOM PROCEDURE TRAY) ×1 IMPLANT
PENCIL SMOKE EVACUATOR (MISCELLANEOUS) ×1 IMPLANT
SLEEVE SCD COMPRESS KNEE MED (STOCKING) ×1 IMPLANT
SPIKE FLUID TRANSFER (MISCELLANEOUS) IMPLANT
SPONGE T-LAP 4X18 ~~LOC~~+RFID (SPONGE) ×1 IMPLANT
STRIP CLOSURE SKIN 1/2X4 (GAUZE/BANDAGES/DRESSINGS) ×1 IMPLANT
SUT CHROMIC 3 0 SH 27 (SUTURE) IMPLANT
SUT ETHILON 2 0 FS 18 (SUTURE) IMPLANT
SUT MON AB 4-0 PC3 18 (SUTURE) ×1 IMPLANT
SUT SILK 2 0 SH (SUTURE) IMPLANT
SUT VIC AB 3-0 SH 27X BRD (SUTURE) ×1 IMPLANT
SUT VICRYL 3-0 CR8 SH (SUTURE) IMPLANT
SYR BULB IRRIG 60ML STRL (SYRINGE) IMPLANT
SYR CONTROL 10ML LL (SYRINGE) ×1 IMPLANT
TOWEL GREEN STERILE FF (TOWEL DISPOSABLE) ×1 IMPLANT
TRAY FAXITRON CT DISP (TRAY / TRAY PROCEDURE) IMPLANT
TUBE CONNECTING 20X1/4 (TUBING) ×1 IMPLANT
YANKAUER SUCT BULB TIP NO VENT (SUCTIONS) ×1 IMPLANT

## 2023-09-14 NOTE — Anesthesia Procedure Notes (Signed)
 Procedure Name: LMA Insertion Date/Time: 09/14/2023 11:13 AM  Performed by: Julieanne Fairy BROCKS, CRNAPre-anesthesia Checklist: Patient identified, Emergency Drugs available, Suction available and Patient being monitored Patient Re-evaluated:Patient Re-evaluated prior to induction Oxygen Delivery Method: Circle system utilized Preoxygenation: Pre-oxygenation with 100% oxygen Induction Type: IV induction Ventilation: Mask ventilation without difficulty LMA: LMA inserted LMA Size: 4.0 Number of attempts: 1 Airway Equipment and Method: Bite block Placement Confirmation: positive ETCO2 Tube secured with: Tape Dental Injury: Teeth and Oropharynx as per pre-operative assessment

## 2023-09-14 NOTE — Transfer of Care (Signed)
 Immediate Anesthesia Transfer of Care Note  Patient: Cheryl Knight  Procedure(s) Performed: Right chest wall wound exploration (Right: Chest)  Patient Location: PACU  Anesthesia Type:General  Level of Consciousness: awake, alert , oriented, and patient cooperative  Airway & Oxygen Therapy: Patient Spontanous Breathing and Patient connected to nasal cannula oxygen  Post-op Assessment: Report given to RN and Post -op Vital signs reviewed and stable  Post vital signs: Reviewed and stable  Last Vitals:  Vitals Value Taken Time  BP    Temp    Pulse 98 09/14/23 12:07  Resp 11 09/14/23 12:07  SpO2 96 % 09/14/23 12:07  Vitals shown include unfiled device data.  Last Pain:  Vitals:   09/14/23 1038  TempSrc: Temporal  PainSc: 0-No pain      Patients Stated Pain Goal: 5 (09/14/23 1038)  Complications: No notable events documented.

## 2023-09-14 NOTE — Anesthesia Postprocedure Evaluation (Signed)
 Anesthesia Post Note  Patient: Cheryl Knight  Procedure(s) Performed: Right chest wall wound exploration (Right: Chest)     Patient location during evaluation: PACU Anesthesia Type: General Level of consciousness: awake and alert Pain management: pain level controlled Vital Signs Assessment: post-procedure vital signs reviewed and stable Respiratory status: spontaneous breathing, nonlabored ventilation and respiratory function stable Cardiovascular status: blood pressure returned to baseline and stable Postop Assessment: no apparent nausea or vomiting Anesthetic complications: no   No notable events documented.  Last Vitals:  Vitals:   09/14/23 1230 09/14/23 1241  BP: 123/68 121/78  Pulse: 86 88  Resp: 11 16  Temp:  (!) 36.3 C  SpO2: 97% 95%    Last Pain:  Vitals:   09/14/23 1241  TempSrc:   PainSc: 3                  Garnette FORBES Skillern

## 2023-09-14 NOTE — Assessment & Plan Note (Deleted)
 Stage IA, p(T1bN0M0), ER/PR+, HER2-, Grade I -Diagnosed in 02/2019, s/p right lumpectomy with SLNB on 03/28/19 by Dr Belinda and adjuvant radiation.  -She started antiestrogen therapy with Tamoxifen  20mg  once daily in 06/2019. tolerating well -She had ER+ R DCIS in 08/2022, s/p mastectomy. No adjuvant therapy. She resumed Tamoxifen 

## 2023-09-14 NOTE — Discharge Instructions (Addendum)
 [facility] [phone] POST OP INSTRUCTIONS  Always review your discharge instruction sheet given to you by the facility where your surgery was performed. IF YOU HAVE DISABILITY OR FAMILY LEAVE FORMS, YOU MUST BRING THEM TO THE OFFICE FOR PROCESSING.   DO NOT GIVE THEM TO YOUR DOCTOR. A prescription for pain medication may be given to you upon discharge.  Take your pain medication as prescribed, if needed.  If narcotic pain medicine is not needed, then you may take acetaminophen  (Tylenol ) or ibuprofen  (Advil ) as needed. Take your usually prescribed medications unless otherwise directed. If you need a refill on your pain medication, please contact your pharmacy.  They will contact our office to request authorization.  Prescriptions will not be filled after 5pm or on week-ends. You should follow a light diet the first few days after arrival home, such as soup and crackers, etc.  Resume your normal diet the day after surgery. Most patients will experience some swelling and bruising on the chest.  Ice packs will help.  Swelling and bruising can take several days to resolve.  It is common to experience some constipation if taking pain medication after surgery.  Increasing fluid intake and taking a stool softener (such as Colace) will usually help or prevent this problem from occurring.  A mild laxative (Milk of Magnesia or Miralax) should be taken according to package instructions if there are no bowel movements after 48 hours. Unless discharge instructions indicate otherwise, leave your bandage dry and in place until your next appointment in 3-5 days.  You may take a limited sponge bath.  No tube baths or showers until the drain is removed.   DRAINS:  If you have drains in place, it is important to keep a list of the amount of drainage produced each day in your drains.  Before leaving the hospital, you should be instructed on drain care.  Call our office if you have any questions  about your drains. ACTIVITIES:  You may resume regular (light) daily activities beginning the next day--such as daily self-care, walking, climbing stairs--gradually increasing activities as tolerated.  You may have sexual intercourse when it is comfortable.  Refrain from any heavy lifting or straining until approved by your doctor. You may drive when you are no longer taking prescription pain medication, you can comfortably wear a seatbelt, and you can safely maneuver your car and apply brakes. RETURN TO WORK:  __________________________________________________________ Cheryl Knight should see your doctor in the office for a follow-up appointment approximately 3-5 days after your surgery.  Your doctor's nurse will typically make your follow-up appointment when she calls you with your pathology report.  Expect your pathology report 2-3 business days after your surgery.  You may call to check if you do not hear from us  after three days.   OTHER INSTRUCTIONS: ______________________________________________________________________________________________ ____________________________________________________________________________________________ WHEN TO CALL YOUR DOCTOR: Fever over 101.0 Nausea and/or vomiting Extreme swelling or bruising Continued bleeding from incision. Increased pain, redness, or drainage from the incision. The clinic staff is available to answer your questions during regular business hours.  Please don't hesitate to call and ask to speak to one of the nurses for clinical concerns.  If you have a medical emergency, go to the nearest emergency room or call 911.  A surgeon from [facility] is always on call at the hospital. [address], [address], [place], [place]  [address] ? [address] [address], [place], [place]   [address] [phone] ? [phone] ? FAX [phone] Web site: [email or web address]   No Tylenol  before 4:45pm today.   Post Anesthesia Home Care  Instructions  Activity: Get plenty of rest for the remainder of the day. A responsible individual must stay with you for 24 hours following the procedure.  For the next 24 hours, DO NOT: -Drive a car -Advertising copywriter -Drink alcoholic beverages -Take any medication unless instructed by your physician -Make any legal decisions or sign important papers.  Meals: Start with liquid foods such as gelatin or soup. Progress to regular foods as tolerated. Avoid greasy, spicy, heavy foods. If nausea and/or vomiting occur, drink only clear liquids until the nausea and/or vomiting subsides. Call your physician if vomiting continues.  Special Instructions/Symptoms: Your throat may feel dry or sore from the anesthesia or the breathing tube placed in your throat during surgery. If this causes discomfort, gargle with warm salt water. The discomfort should disappear within 24 hours.  If you had a scopolamine patch placed behind your ear for the management of post- operative nausea and/or vomiting:  1. The medication in the patch is effective for 72 hours, after which it should be removed.  Wrap patch in a tissue and discard in the trash. Wash hands thoroughly with soap and water. 2. You may remove the patch earlier than 72 hours if you experience unpleasant side effects which may include dry mouth, dizziness or visual disturbances. 3. Avoid touching the patch. Wash your hands with soap and water after contact with the patch.   About my Jackson-Pratt Bulb Drain  What is a Jackson-Pratt bulb? A Jackson-Pratt is a soft, round device used to collect drainage. It is connected to a long, thin drainage catheter, which is held in place by one or two small stiches near your surgical incision site. When the bulb is squeezed, it forms a vacuum, forcing the drainage to empty into the bulb.  Emptying the Jackson-Pratt bulb- To empty the bulb: 1. Release the plug on the top of the bulb. 2. Pour the bulb's contents  into a measuring container which your nurse will provide. 3. Record the time emptied and amount of drainage. Empty the drain(s) as often as your     doctor or nurse recommends.  Date                  Time                    Amount (Drain 1)                 Amount (Drain 2)  _____________________________________________________________________  _____________________________________________________________________  _____________________________________________________________________  _____________________________________________________________________  _____________________________________________________________________  _____________________________________________________________________  _____________________________________________________________________  _____________________________________________________________________  Squeezing the Jackson-Pratt Bulb- To squeeze the bulb: 1. Make sure the plug at the top of the bulb is open. 2. Squeeze the bulb tightly in your fist. You will hear air squeezing from the bulb. 3. Replace the plug while the bulb is squeezed. 4. Use a safety pin to attach the bulb to your clothing. This will keep the catheter from     pulling at the bulb insertion site.  When to call your doctor- Call your doctor if: Drain site becomes red, swollen or hot. You have a fever greater than 101 degrees F. There is oozing at the drain site. Drain falls out (apply a guaze bandage over the drain hole and secure it with tape). Drainage increases daily not related to activity patterns. (You will usually have more drainage when you are active than  when you are resting.) Drainage has a bad odor.

## 2023-09-14 NOTE — H&P (Signed)
 History of Present Illness: Cheryl Knight is a 68 y.o. female who is seen today for another wound check .     This is a 68 year old female who was diagnosed in 02/2019 with Grade 1 invasive ductal carcinoma of the right breast.  Her tumor was located at 0700. ER/PR positive, her 2 negative.  She underwent right RSL/ SLNB on 03/28/19.  Margins were negative/ SLNx 2 negative.  She received radiation therapy and is currently on Tamoxifen .     Recently she had a mammogram that revealed a focal asymmetry in the right breast at 12:00.  Work-up revealed a 1.4 x 0.8 x 1.4 cm mass 5-6 cmfn.  Biopsy revealed DCIS high-grade/ ER positive/ PR negative with suspicion for microinvasion.  After thorough discussion with the patient, she opted for right mastectomy with sentinel lymph node biopsy on 09/14/2022.     Pathology confirmed invasive ductal carcinoma 2 x 1.5 x 1.3 cm with negative margins.  1 sentinel lymph node was negative.  She has not yet seen oncology for follow-up. Her drain was removed on 09/23/2022.  The patient developed a persistent seroma that required multiple aspirations.  On 12/13/2022, we returned to the operating room where I evacuated the right chest seroma placed a Penrose drain.  The Penrose drain was removed on 01/03/2023.  Most of the seroma cavity is closed but she has persistent drainage from a 1 cm opening.  Despite multiple attempts to cauterized with silver nitrate, the drainage persist.       Medical History:     Past Medical History:  Diagnosis Date   Arthritis     Diabetes mellitus without complication (CMS/HHS-HCC)     GERD (gastroesophageal reflux disease)     History of cancer     Hyperlipidemia     Hypertension     Thyroid  disease           Patient Active Problem List  Diagnosis   Aortic atherosclerosis ()   Basal cell carcinoma of chest wall   Bronchiectasis without complication (CMS/HHS-HCC)   Chronic back pain   Esophageal reflux   Fatty liver   Glaucoma    Hyperlipidemia   Hypertension associated with diabetes (CMS/HHS-HCC)   ILD (interstitial lung disease) (CMS/HHS-HCC)   Malignant neoplasm of lower-outer quadrant of right breast of female, estrogen receptor positive (CMS/HHS-HCC)   Obesity (BMI 30-39.9), unspecified   Migraine   Osteoarthritis   Pulmonary fibrosis (CMS/HHS-HCC)   Rheumatoid arthritis (CMS/HHS-HCC)   Type 2 diabetes mellitus with hyperglycemia, without long-term current use of insulin  (CMS/HHS-HCC)   History of right breast cancer   Ductal carcinoma in situ (DCIS) of right breast   Invasive ductal carcinoma of breast, female, right (CMS/HHS-HCC)           Past Surgical History:  Procedure Laterality Date   COLONOSCOPY   08/14/2019    Tubular adenoma/Repeat 75yrs/TKT   EGD   08/14/2019    Gastritis/Esophagitis/No Repeat/TKT   CHOLECYSTECTOMY   10/2019    robotic assited laparoscopic by isami sakai   DRAINAGE OF RIGHT CHEST WALL SEROMA   12/13/2022    Dr Belinda          Allergies  Allergen Reactions   Demerol [Meperidine] Nausea and Vomiting   Lisinopril  Cough   Tylox [Oxycodone -Acetaminophen ] Hallucination            Current Outpatient Medications on File Prior to Visit  Medication Sig Dispense Refill   albuterol  90 mcg/actuation inhaler INHALE 1  TO 2 PUFFS INTO THE LUNGS EVERY 6 HOURS AS NEEDED FOR WHEEZING OR SHORTNESS OF BREATH       betamethasone  dipropionate (DIPROSONE ) 0.05 % cream Apply topically 2 (two) times daily       budesonide -formoteroL  (SYMBICORT ) 160-4.5 mcg/actuation inhaler         calcium  carbonate-vitamin D3 (CALTRATE 600+D) 600 mg(1,500mg ) -400 unit Chew chewable tablet Take 1 tablet by mouth once daily          cetirizine (ZYRTEC) 10 MG tablet Take 10 mg by mouth once daily       cyclobenzaprine  (FLEXERIL ) 10 MG tablet Take 10 mg by mouth once daily       docosahexaenoic acid-epa 120-180 mg Cap Take 1 capsule by mouth once daily       empagliflozin  (JARDIANCE ) 10 mg tablet Take 10 mg  by mouth every morning before breakfast       esomeprazole  (NEXIUM ) 40 MG DR capsule Take 1 capsule (40 mg total) by mouth once daily 90 capsule 2   famotidine  (PEPCID ) 20 MG tablet Take 20 mg by mouth 2 (two) times daily       fluticasone  propionate (FLONASE ) 50 mcg/actuation nasal spray Place 2 sprays into both nostrils once daily       glucosamine su 2KCl-chondroit 500-400 mg Tab Take 1 tablet by mouth 3 (three) times a day          HYDROcodone -acetaminophen  (NORCO) 5-325 mg tablet         leflunomide  (ARAVA ) 20 MG tablet Take 20 mg by mouth once daily          levothyroxine  (SYNTHROID ) 100 MCG tablet Take 100 mcg by mouth once daily          MIEBO , PF, 100 % Drop Apply to eye       nystatin  (MYCOSTATIN ) 100,000 unit/mL suspension         olmesartan -hydrochlorothiazide  (BENICAR  HCT) 20-12.5 mg tablet Take 1 tablet by mouth every morning       pilocarpine  (SALAGEN ) 5 mg tablet Take 5 mg by mouth 3 (three) times daily       pravastatin  (PRAVACHOL ) 40 MG tablet Take 20 mg by mouth at bedtime       promethazine  (PHENERGAN ) 25 MG tablet Take 25 mg by mouth every 6 (six) hours as needed for Nausea       RESTASIS  0.05 % ophthalmic emulsion Place 1 drop into both eyes 2 (two) times daily       semaglutide  (OZEMPIC ) 0.25 mg or 0.5 mg(2 mg/1.5 mL) pen injector Inject 0.25 mg subcutaneously once a week       simvastatin  (ZOCOR ) 40 MG tablet Take by mouth       SUMAtriptan  (IMITREX ) 50 MG tablet TAKE 1 TABLET BY MOUTH AS NEEDED FOR HEADACHE       tamoxifen  (NOLVADEX ) 20 MG tablet          topiramate  (TOPAMAX ) 100 MG tablet Take 100 mg by mouth once daily          traMADoL  (ULTRAM ) 50 mg tablet          No current facility-administered medications on file prior to visit.           Family History  Problem Relation Age of Onset   Skin cancer Mother     Obesity Mother     High blood pressure (Hypertension) Mother     Hyperlipidemia (Elevated cholesterol) Mother     Diabetes Mother  Breast  cancer Mother        Social History        Tobacco Use  Smoking Status Former   Types: Cigarettes  Smokeless Tobacco Never      Social History         Socioeconomic History   Marital status: Married  Tobacco Use   Smoking status: Former      Types: Cigarettes   Smokeless tobacco: Never  Vaping Use   Vaping status: Never Used  Substance and Sexual Activity   Alcohol  use: Yes   Drug use: Never    Social Drivers of Acupuncturist Strain: Low Risk  (04/21/2023)    Received from [facility]    Overall Financial Resource Strain (CARDIA)     Difficulty of Paying Living Expenses: Not hard at all  Food Insecurity: No Food Insecurity (04/21/2023)    Received from [facility]    Hunger Vital Sign     Within the past 12 months, you worried that your food would run out before you got the money to buy more.: Never true     Within the past 12 months, the food you bought just didn't last and you didn't have money to get more.: Never true  Transportation Needs: No Transportation Needs (04/21/2023)    Received from [facility] - Transportation     Lack of Transportation (Medical): No     Lack of Transportation (Non-Medical): No  Physical Activity: Insufficiently Active (04/21/2023)    Received from [facility]    Exercise Vital Sign     On average, how many days per week do you engage in moderate to strenuous exercise (like a brisk walk)?: 2 days     On average, how many minutes do you engage in exercise at this level?: 20 min  Stress: No Stress Concern Present (04/21/2023)    Received from [facility] - Occupational Stress Questionnaire     Feeling of Stress : Not at all  Social Connections: Moderately Integrated (04/21/2023)    Received from [facility]    Social Connection and Isolation Panel     In a typical week, how many times do you talk on the phone with family, friends, or neighbors?: Twice a week     How  often do you get together with friends or relatives?: Once a week     How often do you attend church or religious services?: Never     Do you belong to any clubs or organizations such as church groups, unions, fraternal or athletic groups, or school groups?: Yes     How often do you attend meetings of the clubs or organizations you belong to?: 1 to 4 times per year     Are you married, widowed, divorced, separated, never married, or living with a partner?: Married  Housing Stability: Unknown (01/31/2023)    Housing Stability Vital Sign     Homeless in the Last Year: No      Objective:         Vitals:    08/12/23 1046  PainSc: 0-No pain      Physical Exam    Constitutional:  WDWN in NAD, conversant, no obvious deformities; lying in bed comfortably Eyes:  Pupils equal, round; sclera anicteric; moist conjunctiva; no lid lag HENT:  Oral mucosa moist; good dentition  Neck:  No masses palpated, trachea  midline; no thyromegaly Lungs:  CTA bilaterally; normal respiratory effort Breasts: The right chest incision is intact and almost completely healed.  In the central portion of the incision, there is very thick scar tissue below the incision.  This creates a crevice.  Deep within the crevice there is a 1 cm opening in the skin.  There is visible granulation tissue in this area.  I explored the wound with a cotton swab.  It measures approximately 2 x 1 cm. CV:  Regular rate and rhythm; no murmurs; extremities well-perfused with no edema Abd:  +bowel sounds, soft, non-tender, no palpable organomegaly; no palpable hernias Musc: Normal gait; no apparent clubbing or cyanosis in extremities Lymphatic:  No palpable cervical or axillary lymphadenopathy Skin:  Warm, dry; no sign of jaundice Psychiatric - alert and oriented x 4; calm mood and affect       Assessment and Plan:  Diagnoses and all orders for this visit:   History of right breast cancer   Nonhealing surgical wound, sequela        The chronic wound does not seem to be healing any further at this time.  I discussed the situation with the patient and her husband.  I recommended returning to the operating room for localized wound exploration under anesthesia.  We will excise the thick scar tissue inferior to the wound as well as some of the chronic granulation tissue in the wound.  Hopefully excising the scar tissue back to healthy tissue will aid in faster healing.  They are in agreement with this plan and wished to proceed.   Wound exploration right chest wall.The surgical procedure has been discussed with the patient.  Potential risks, benefits, alternative treatments, and expected outcomes have been explained.  All of the patient's questions at this time have been answered.  The likelihood of reaching the patient's treatment goal is good.  The patient understands the proposed surgical procedure and wishes to proceed.  Donnice POUR. Belinda, MD, [facility]  General Surgery   09/14/2023 10:35 AM

## 2023-09-14 NOTE — Op Note (Signed)
 Pre-op diagnosis: Chronic right chest wound Postop diagnosis: Same Procedure performed: Wound exploration right chest wall Surgeon:Austin Herd K Day Deery Anesthesia: General Indications: This is a 68 year old female with recurrent breast cancer who underwent right mastectomy in August 2024.  She had had previous radiation to the right breast.  She has had a persistent seroma that has required reoperation and wound care.  She has a chronic wound that appears to be about 2 to 3 cm in greatest diameter that is not healing.  She presents now for exploration and debridement of this chronic wound.  Description of procedure: The patient is brought to the operating room placed in the supine position on the operating room table.  After adequate level general anesthesia was obtained, her entire right chest was prepped with Betadine and draped sterile fashion.  A timeout was taken to ensure the proper patient and proper procedure.  The patient has a 7 mm opening with some visible granulation tissue.  I inserted a hemostat into this area and the cavity seems to be about 3 cm in greatest diameter.  I excised some of the chronic scar over this area to expose all the granulation tissue.  I excised its the edges of the skin and the thick hard subcutaneous scar.  We excised the bed of granulation tissue down to the chest wall.  We undermined the skin flaps superiorly and inferiorly to allow mobilization of the surrounding tissue.  I irrigated the wound thoroughly and inspected carefully for hemostasis.  We inserted a 19 French drain through a small stab incision and secured with 2-0 Ethilon.  The drain was cut to fit the size of the wound.  We then closed the wound with multiple interrupted 3-0 Vicryl sutures in the deep subcutaneous tissue.  4-0 Monocryl was used to close the skin.  Dermabond was applied to the skin incision.  The drain was placed to bulb suction.  An occlusive dressing was placed around the drain.  The patient was  then extubated and brought to the recovery room in stable condition.  All sponge, instrument, and needle counts are correct.  Donnice POUR. Belinda, MD, [facility]  General Surgery   09/14/2023 12:11 PM

## 2023-09-14 NOTE — Anesthesia Preprocedure Evaluation (Addendum)
 Anesthesia Evaluation  Patient identified by MRN, date of birth, ID band Patient awake    Reviewed: Allergy & Precautions, NPO status , Patient's Chart, lab work & pertinent test results  History of Anesthesia Complications (+) PONV, DIFFICULT AIRWAY and history of anesthetic complications (Trach at 68yo; patient reports her sister coded with propofol )  Airway Mallampati: III  TM Distance: <3 FB Neck ROM: Full    Dental  (+) Teeth Intact, Dental Advisory Given,    Pulmonary former smoker ILD, Bronchiectasis   Pulmonary exam normal breath sounds clear to auscultation       Cardiovascular hypertension, Pt. on medications Normal cardiovascular exam Rhythm:Regular Rate:Normal     Neuro/Psych  Headaches  negative psych ROS   GI/Hepatic Neg liver ROS,GERD  Medicated,,  Endo/Other  diabetes, Type 2Hypothyroidism  Obesity   Renal/GU negative Renal ROS     Musculoskeletal  (+) Arthritis , Rheumatoid disorders,    Abdominal   Peds  Hematology negative hematology ROS (+)   Anesthesia Other Findings Day of surgery medications reviewed with the patient.  CHRONIC WOUND RIGHT CHEST WALL  Reproductive/Obstetrics                              Anesthesia Physical Anesthesia Plan  ASA: 3  Anesthesia Plan: General   Post-op Pain Management: Tylenol  PO (pre-op)*   Induction: Intravenous  PONV Risk Score and Plan: 4 or greater and Dexamethasone  and Ondansetron   Airway Management Planned: LMA  Additional Equipment:   Intra-op Plan:   Post-operative Plan: Extubation in OR  Informed Consent: I have reviewed the patients History and Physical, chart, labs and discussed the procedure including the risks, benefits and alternatives for the proposed anesthesia with the patient or authorized representative who has indicated his/her understanding and acceptance.     Dental advisory given  Plan  Discussed with: CRNA  Anesthesia Plan Comments:          Anesthesia Quick Evaluation

## 2023-09-15 ENCOUNTER — Encounter (HOSPITAL_BASED_OUTPATIENT_CLINIC_OR_DEPARTMENT_OTHER): Payer: Self-pay | Admitting: Surgery

## 2023-09-15 ENCOUNTER — Other Ambulatory Visit

## 2023-09-15 ENCOUNTER — Inpatient Hospital Stay: Admitting: Hematology

## 2023-09-15 DIAGNOSIS — C50511 Malignant neoplasm of lower-outer quadrant of right female breast: Secondary | ICD-10-CM

## 2023-09-20 LAB — SURGICAL PATHOLOGY

## 2023-09-27 ENCOUNTER — Other Ambulatory Visit: Payer: Self-pay | Admitting: Physician Assistant

## 2023-09-27 DIAGNOSIS — H16229 Keratoconjunctivitis sicca, not specified as Sjogren's, unspecified eye: Secondary | ICD-10-CM | POA: Diagnosis not present

## 2023-09-27 DIAGNOSIS — M0579 Rheumatoid arthritis with rheumatoid factor of multiple sites without organ or systems involvement: Secondary | ICD-10-CM

## 2023-09-27 DIAGNOSIS — B028 Zoster with other complications: Secondary | ICD-10-CM | POA: Diagnosis not present

## 2023-09-27 DIAGNOSIS — E119 Type 2 diabetes mellitus without complications: Secondary | ICD-10-CM | POA: Diagnosis not present

## 2023-09-27 DIAGNOSIS — Z961 Presence of intraocular lens: Secondary | ICD-10-CM | POA: Diagnosis not present

## 2023-09-27 NOTE — Telephone Encounter (Signed)
 Patient contacted the office back. Patient states she does not need a refill yet and that she still has plenty from not taking it for surgery.

## 2023-09-30 ENCOUNTER — Ambulatory Visit

## 2023-09-30 VITALS — BP 114/80 | HR 86 | Temp 98.6°F | Ht 66.25 in | Wt 198.0 lb

## 2023-09-30 DIAGNOSIS — E039 Hypothyroidism, unspecified: Secondary | ICD-10-CM | POA: Diagnosis not present

## 2023-09-30 DIAGNOSIS — K3184 Gastroparesis: Secondary | ICD-10-CM | POA: Diagnosis not present

## 2023-09-30 DIAGNOSIS — E118 Type 2 diabetes mellitus with unspecified complications: Secondary | ICD-10-CM | POA: Diagnosis not present

## 2023-09-30 NOTE — Progress Notes (Signed)
 Subjective:    Patient ID: Cheryl Knight, female    DOB: 1955-01-26, 68 y.o.   MRN: [id number]   Cheryl Knight is a very pleasant 68 y.o. female who presents today as a new patient.  Past medical, surgical and family history: Reviewed and updated in chart.  Allergies: Reviewed and updated in chart.  Medications: Reviewed and updated in chart.  Social history: Reviewed and updated in chart.  Last PCP and reason for leaving: [facility]  Today, wants to discuss Ozempic  side effects. Was on it for several months, stopped 6wks ago, stopped because of severe diarrhea, liquid stools, 10-12 Bms a day, now on immodium she has 5BMs a day still loose, some nausea, abdominal pain which is releaved after she has a bowel movement, no blood in stool, undigested food coming out through Bms, feels she had IBS, red sauces makes her sx worse, no vomiting, no fever/chills Was on Ozempic  0.25mg .  In the past was on Metformin  which gave her diarrhea and jardiance  which caused yeast infxn Taking 2 tablets immoidium once a day- optimize    Review of Systems  All other systems reviewed and are negative.        Past Medical History:  Diagnosis Date   Cancer (HCC) 02/2019   right breast IDC   Cataracts, bilateral    tbd surgery 2021   Complication of anesthesia    Cough    Diabetes mellitus without complication (HCC)    Difficult intubation    TRACH AT 19   Dry eye syndrome    Dyspnea    W/ COUGHING- was due to lisinopril  and septic gallbladder   Family history of adverse reaction to anesthesia    sister coded with propofol  use   Family history of breast cancer    Family history of lung cancer    Family history of prostate cancer    Family history of skin cancer    GERD (gastroesophageal reflux disease)    Glaucoma    stable glaucoma suspect 01/19/21 [facility]   Headache    High cholesterol    History of hiatal hernia    History of pneumonia    walking pneumonia  years ago   Hypertension    Hypothyroidism    Irritable bowel    Osteoarthritis    i.e right knee    PONV (postoperative nausea and vomiting)    Pre-diabetes    Retinopathy    htn 01/19/21 [facility]   Rheumatoid arthritis (HCC)    Squamous cell carcinoma of skin of chest    removed   Thyroid  disease     Social History   Socioeconomic History   Marital status: Married    Spouse name: Not on file   Number of children: 2   Years of education: Not on file   Highest education level: Associate degree: occupational, Scientist, product/process development, or vocational program  Occupational History   Occupation: Engineer, civil (consulting)  Tobacco Use   Smoking status: Former    Current packs/day: 0.00    Average packs/day: 0.3 packs/day for 7.0 years (1.8 ttl pk-yrs)    Types: Cigarettes    Start date: 18    Quit date: 2000    Years since quitting: 25.7    Passive exposure: Past   Smokeless tobacco: Never  Vaping Use   Vaping status: Never Used  Substance and Sexual Activity   Alcohol  use: Yes    Comment: rare- with birthdays and beach trips   Drug  use: Never   Sexual activity: Yes    Birth control/protection: None  Other Topics Concern   Not on file  Social History Narrative   Retired Public house manager   Lives with husband, former smoker 1 cigarrette a day but has quit   Social Drivers of Corporate investment banker Strain: Low Risk  (04/21/2023)   Overall Financial Resource Strain (CARDIA)    Difficulty of Paying Living Expenses: Not hard at all  Food Insecurity: No Food Insecurity (04/21/2023)   Hunger Vital Sign    Worried About Running Out of Food in the Last Year: Never true    Ran Out of Food in the Last Year: Never true  Transportation Needs: No Transportation Needs (04/21/2023)   PRAPARE - Administrator, Civil Service (Medical): No    Lack of Transportation (Non-Medical): No  Physical Activity: Insufficiently Active (04/21/2023)   Exercise Vital Sign    Days of Exercise per Week: 2 days    Minutes of  Exercise per Session: 20 min  Stress: No Stress Concern Present (04/21/2023)   [employer] of Occupational Health - Occupational Stress Questionnaire    Feeling of Stress : Not at all  Social Connections: Moderately Integrated (04/21/2023)   Social Connection and Isolation Panel    Frequency of Communication with Friends and Family: Twice a week    Frequency of Social Gatherings with Friends and Family: Once a week    Attends Religious Services: Never    Database administrator or Organizations: Yes    Attends Banker Meetings: 1 to 4 times per year    Marital Status: Married  Catering manager Violence: Not At Risk (11/02/2022)   Humiliation, Afraid, Rape, and Kick questionnaire    Fear of Current or Ex-Partner: No    Emotionally Abused: No    Physically Abused: No    Sexually Abused: No    Past Surgical History:  Procedure Laterality Date   ABDOMINAL HYSTERECTOMY     age 35 ovaries out, cervix removed too   BREAST LUMPECTOMY WITH RADIOACTIVE SEED AND SENTINEL LYMPH NODE BIOPSY Right 03/28/2019   Procedure: RIGHT BREAST LUMPECTOMY WITH RADIOACTIVE SEED AND SENTINEL LYMPH NODE BIOPSY;  Surgeon: Belinda Cough, MD;  Location: [facility];  Service: General;  Laterality: Right;  PEC BLOCK FOR POST OP PAIN   BREAST SURGERY     CATARACT EXTRACTION, BILATERAL     CHOLECYSTECTOMY     10/2019   COLONOSCOPY     x2   ESOPHAGEAL DILATION     Dr. Obie 15 years from 2021- x2   EXCISION OF BREAST LESION Right 09/14/2023   Procedure: Right chest wall wound exploration;  Surgeon: Belinda Cough, MD;  Location:  SURGERY CENTER;  Service: General;  Laterality: Right;   MASTECTOMY W/ SENTINEL NODE BIOPSY Right 09/14/2022   Procedure: RIGHT MASTECTOMY WITH SENTINEL LYMPH NODE BIOPSY;  Surgeon: Belinda Cough, MD;  Location: [facility];  Service: General;  Laterality: Right;  LMA PEC BLOCK   SKIN BIOPSY     04/2022, right arm, left leg   TONSILLECTOMY     68  years old- had trach   TRACHEOSTOMY     68 years old- placed and removed within 24 hours during tonsillectomy   TUBAL LIGATION      Family History  Problem Relation Age of Onset   Hypertension Mother    Thyroid  disease Mother    Breast cancer Mother 102  breast dx age 9 died age 102    Prostate cancer Father 51       metastatic   Hypothyroidism Sister    Stroke Maternal Grandmother    Skin cancer Maternal Uncle        dx. in his 76s   Lung cancer Maternal Uncle        smoker, died age 9    Allergies  Allergen Reactions   Pentazocine Lactate Other (See Comments)    Hallucinations   Tape     If left on long enough causes skin irritation    Demerol [Meperidine] Nausea And Vomiting   Lisinopril  Cough   Nystatin  Rash    Powder or cream   Ozempic  (0.25 Or 0.5 Mg-Dose) [Semaglutide (0.25 Or 0.5mg -Dos)] Diarrhea    Current Outpatient Medications on File Prior to Visit  Medication Sig Dispense Refill   amLODipine  (NORVASC ) 2.5 MG tablet TAKE 1 TABLET BY MOUTH DAILY 90 tablet 3   Ascorbic Acid (VITAMIN C) 1000 MG tablet Take 1,000 mg by mouth daily.     aspirin-acetaminophen -caffeine (EXCEDRIN MIGRAINE) 250-250-65 MG tablet Take 2 tablets by mouth every 8 (eight) hours as needed for headache.     betamethasone  dipropionate 0.05 % cream Apply topically 2 (two) times daily.     Blood Glucose Monitoring Suppl DEVI 1 each by Does not apply route 3 (three) times a week. 1 each 0   Calcium  Carb-Cholecalciferol (CALCIUM  600 + D PO) Take 1 tablet by mouth 2 (two) times daily.     Calcium  Carbonate Antacid 1177 MG CHEW Chew 1,177 mg by mouth at bedtime.     cyanocobalamin  (VITAMIN B12) 1000 MCG tablet Take 1,000 mcg by mouth every other day.     Dextran 70-Hypromellose ([employer] NATURAL TEARS PF OP) Place 1 drop into both eyes See admin instructions. Instill 1 drop into both eyes up to 6 times daily as needed for dry eyes     esomeprazole  (NEXIUM ) 40 MG capsule TAKE 1 CAPSULE(40 MG) BY  MOUTH DAILY 90 capsule 3   famotidine  (PEPCID ) 20 MG tablet TAKE 1 TABLET BY MOUTH 2 TIMES A DAY 180 tablet 3   glucosamine-chondroitin 500-400 MG tablet Take 1 tablet by mouth 2 (two) times daily.     Glucose Blood (BLOOD GLUCOSE TEST STRIPS) STRP 1 each by In Vitro route 3 (three) times a week. 100 strip 0   Lancets (ONETOUCH DELICA PLUS LANCET33G) MISC SMARTSIG:Via Meter 3 Times a Week     Lancets Misc. MISC 1 each by Does not apply route 3 (three) times a week. 100 each 0   leflunomide  (ARAVA ) 20 MG tablet TAKE 1 TABLET BY MOUTH DAILY 90 tablet 0   levothyroxine  (SYNTHROID ) 100 MCG tablet TAKE 1 TABLET BY MOUTH DAILY 30 MINUTES BEFORE FOOD 90 tablet 0   loratadine  (CLARITIN ) 10 MG tablet Take 10 mg by mouth daily.     Melatonin 10 MG CAPS Take 10 mg by mouth at bedtime.     Multiple Vitamin (MULTIVITAMIN WITH MINERALS) TABS tablet Take 1 tablet by mouth daily.     olmesartan -hydrochlorothiazide  (BENICAR  HCT) 20-12.5 MG tablet TAKE 1 TABLET BY MOUTH EVERY MORNING 90 tablet 3   Omega-3 Fatty Acids (FISH OIL) 1000 MG CAPS Take 1,000 mg by mouth daily.     OVER THE COUNTER MEDICATION Blink Eye Drops     oxyCODONE  (OXY IR/ROXICODONE ) 5 MG immediate release tablet Take 1 tablet (5 mg total) by mouth every 6 (six) hours as needed for  severe pain (pain score 7-10). 15 tablet 0   RESTASIS  MULTIDOSE 0.05 % ophthalmic emulsion SMARTSIG:In Eye(s)     simvastatin  (ZOCOR ) 40 MG tablet Take 1 tablet (40 mg total) by mouth at bedtime. 90 tablet 3   tamoxifen  (NOLVADEX ) 20 MG tablet TAKE 1 TABLET(20 MG) BY MOUTH DAILY 90 tablet 3   tiZANidine  (ZANAFLEX ) 4 MG tablet TAKE 1 TABLET(4 MG) BY MOUTH EVERY 6 HOURS AS NEEDED FOR MUSCLE SPASMS 30 tablet 1   topiramate  (TOPAMAX ) 100 MG tablet TAKE 1 TABLET(100 MG) BY MOUTH DAILY 90 tablet 3   valACYclovir (VALTREX) 1000 MG tablet Take 1,000 mg by mouth 3 (three) times daily.     White Petrolatum-Mineral Oil (SYSTANE NIGHTTIME) OINT Apply to eye.     No current  facility-administered medications on file prior to visit.    BP 114/80 (BP Location: Left Arm, Patient Position: Sitting, Cuff Size: Large)   Pulse 86   Temp 98.6 F (37 C) (Oral)   Ht 5' 6.25 (1.683 m)   Wt 198 lb (89.8 kg)   SpO2 95%   BMI 31.72 kg/m   Objective:    Physical Exam Vitals and nursing note reviewed.  Constitutional:      Appearance: She is obese.  HENT:     Head: Normocephalic and atraumatic.  Eyes:     Extraocular Movements: Extraocular movements intact.     Conjunctiva/sclera: Conjunctivae normal.  Skin:    General: Skin is warm.  Neurological:     Mental Status: She is alert.  Psychiatric:        Mood and Affect: Mood normal.        Behavior: Behavior normal.            Assessment & Plan:   1. Type 2 diabetes with complication (HCC) (Primary) 2. Gastroparesis New patient, past medical and social history thoroughly reviewed and updated in chart.  Diabetes has been well-controlled in the past per chart review, A1c from 6 months ago was 6.7, within age-appropriate goal.  Of note, patient was previously on Ozempic  0.25 mg weekly and has since discontinued it secondary to excessive diarrhea, which has persisted now several weeks after discontinuing it.  Counseled patient that this is an expected side effect of the GLP-1 medications given that the slow gut motility.  Counseled patient that given the novelty of these drugs, there is not a robust evidence analyzing the long-term side effects, including whether or not the resultant gastroparesis they cause will resolve in some individuals but persist and others.  Unclear whether patient's symptoms will completely resolve but in the meantime, we will continue to treat with Imodium, unclear what dose patient is on at this time, counseled to update me via MyChart so that dose can be optimized.  Plan to continue on this regimen for several more weeks then gradually de-escalate as patient's symptoms improve.  Will  avoid Bentyl at this time given recommendation that it not be used longer than 2 weeks and patient may require several more weeks of antidiarrheal therapy. In the meantime, we will repeat A1c given that if it is increased above goal, we will need to discuss alternative antidiabetic treatment.  Moreover, patient will need a comprehensive diabetic visit.  - Hemoglobin A1c - Continue Imodium daily   3. Hypothyroidism, unspecified type Of note, patient has been steady on current Synthroid  dose, last TSH was nearly 10 months ago, will repeat as part of monitoring and augment levothyroxine  dose if indicated.  Symptoms otherwise  well-controlled, at this time we will continue regimen as below.  - TSH - Continue levothyroxine  100 mcg daily  Return in about 1 week (around 10/07/2023) for Diabetic visit.   Steed Kanaan K Desirae Mancusi, MD  09/30/23

## 2023-09-30 NOTE — Patient Instructions (Addendum)
 Thank you for visiting [facility] today! Here's what we talked about: - Send via MyChart your current dose of Immodium: how many mg is each tablet

## 2023-10-01 LAB — HEMOGLOBIN A1C
Hgb A1c MFr Bld: 6.4 % — ABNORMAL HIGH (ref <5.7)
Mean Plasma Glucose: 137 mg/dL
eAG (mmol/L): 7.6 mmol/L

## 2023-10-01 LAB — TSH: TSH: 3.94 m[IU]/L (ref 0.40–4.50)

## 2023-10-03 DIAGNOSIS — M5416 Radiculopathy, lumbar region: Secondary | ICD-10-CM | POA: Diagnosis not present

## 2023-10-04 ENCOUNTER — Ambulatory Visit: Payer: Self-pay

## 2023-10-07 DIAGNOSIS — B028 Zoster with other complications: Secondary | ICD-10-CM | POA: Diagnosis not present

## 2023-10-07 DIAGNOSIS — H16229 Keratoconjunctivitis sicca, not specified as Sjogren's, unspecified eye: Secondary | ICD-10-CM | POA: Diagnosis not present

## 2023-10-07 DIAGNOSIS — Z961 Presence of intraocular lens: Secondary | ICD-10-CM | POA: Diagnosis not present

## 2023-10-07 DIAGNOSIS — H11001 Unspecified pterygium of right eye: Secondary | ICD-10-CM | POA: Diagnosis not present

## 2023-10-11 DIAGNOSIS — D225 Melanocytic nevi of trunk: Secondary | ICD-10-CM | POA: Diagnosis not present

## 2023-10-11 DIAGNOSIS — S2096XA Insect bite (nonvenomous) of unspecified parts of thorax, initial encounter: Secondary | ICD-10-CM | POA: Diagnosis not present

## 2023-10-11 DIAGNOSIS — Z1283 Encounter for screening for malignant neoplasm of skin: Secondary | ICD-10-CM | POA: Diagnosis not present

## 2023-10-12 ENCOUNTER — Other Ambulatory Visit: Payer: Self-pay

## 2023-10-12 DIAGNOSIS — E785 Hyperlipidemia, unspecified: Secondary | ICD-10-CM

## 2023-10-13 MED ORDER — SIMVASTATIN 40 MG PO TABS: 40.0000 mg | ORAL_TABLET | Freq: Every day | ORAL | 3 refills | Status: AC

## 2023-10-14 ENCOUNTER — Ambulatory Visit

## 2023-10-14 VITALS — BP 124/72 | HR 91 | Temp 98.0°F | Ht 66.25 in | Wt 198.4 lb

## 2023-10-14 DIAGNOSIS — E118 Type 2 diabetes mellitus with unspecified complications: Secondary | ICD-10-CM

## 2023-10-14 DIAGNOSIS — Z23 Encounter for immunization: Secondary | ICD-10-CM | POA: Diagnosis not present

## 2023-10-14 DIAGNOSIS — Z6831 Body mass index (BMI) 31.0-31.9, adult: Secondary | ICD-10-CM

## 2023-10-14 DIAGNOSIS — E669 Obesity, unspecified: Secondary | ICD-10-CM | POA: Diagnosis not present

## 2023-10-14 LAB — MICROALBUMIN / CREATININE URINE RATIO
Creatinine,U: 94.8 mg/dL
Microalb Creat Ratio: 13.9 mg/g (ref 0.0–30.0)
Microalb, Ur: 1.3 mg/dL (ref 0.0–1.9)

## 2023-10-14 NOTE — Progress Notes (Signed)
 Subjective:    Patient ID: Cheryl Knight, female    DOB: [birth year], 68 y.o.   MRN: [id number]  HPI  Cheryl Knight is a very pleasant 67 y.o. female who presents today for DM visit. Imodium once day, taking 2 of the 2mg  tabs after diarrhea daily, episodes of diarrhea improved to 2-3  review labs -Denies polydipsia, polyuria, weight loss, fatigue or any LE wounds.   Current A1c: 6.4 Last A1c: 6.7 Current meds: None Neuropathy: No Nephropathy: No Retinopathy: No   Urine microalbumin: Need Eye exam: [facility] Foot exam: Needs Ace-i/ARB:Yes Statin: Good Influenza vaccine: Need Pneumococcal vaccine: UTD Diet: Eating lots of fruits, lots of water Weight concerns: Referral given  Flu,  Review of Systems  All other systems reviewed and are negative.        Past Medical History:  Diagnosis Date   Cancer (HCC) 02/2019   right breast IDC   Cataracts, bilateral    tbd surgery 2021   Complication of anesthesia    Cough    Diabetes mellitus without complication (HCC)    Difficult intubation    TRACH AT 19   Dry eye syndrome    Dyspnea    W/ COUGHING- was due to lisinopril  and septic gallbladder   Family history of adverse reaction to anesthesia    sister coded with propofol  use   Family history of breast cancer    Family history of lung cancer    Family history of prostate cancer    Family history of skin cancer    GERD (gastroesophageal reflux disease)    Glaucoma    stable glaucoma suspect 01/19/21 [facility]   Headache    High cholesterol    History of hiatal hernia    History of pneumonia    walking pneumonia years ago   Hypertension    Hypothyroidism    Irritable bowel    Osteoarthritis    i.e right knee    PONV (postoperative nausea and vomiting)    Pre-diabetes    Retinopathy    htn 01/19/21 [facility]   Rheumatoid arthritis (HCC)    Squamous cell carcinoma of skin of chest    removed   Thyroid  disease     Social History    Socioeconomic History   Marital status: Married    Spouse name: Not on file   Number of children: 2   Years of education: Not on file   Highest education level: Associate degree: occupational, Scientist, product/process development, or vocational program  Occupational History   Occupation: Engineer, civil (consulting)  Tobacco Use   Smoking status: Former    Current packs/day: 0.00    Average packs/day: 0.3 packs/day for 7.0 years (1.8 ttl pk-yrs)    Types: Cigarettes    Start date: 15    Quit date: 2000    Years since quitting: 25.7    Passive exposure: Past   Smokeless tobacco: Never  Vaping Use   Vaping status: Never Used  Substance and Sexual Activity   Alcohol  use: Yes    Comment: rare- with birthdays and beach trips   Drug use: Never   Sexual activity: Yes    Birth control/protection: None  Other Topics Concern   Not on file  Social History Narrative   Retired Public house manager   Lives with husband, former smoker 1 cigarrette a day but has quit   Social Drivers of Corporate investment banker Strain: Low Risk  (04/21/2023)   Overall Financial Resource Strain (CARDIA)  Difficulty of Paying Living Expenses: Not hard at all  Food Insecurity: No Food Insecurity (04/21/2023)   Hunger Vital Sign    Worried About Running Out of Food in the Last Year: Never true    Ran Out of Food in the Last Year: Never true  Transportation Needs: No Transportation Needs (04/21/2023)   PRAPARE - Administrator, Civil Service (Medical): No    Lack of Transportation (Non-Medical): No  Physical Activity: Insufficiently Active (04/21/2023)   Exercise Vital Sign    Days of Exercise per Week: 2 days    Minutes of Exercise per Session: 20 min  Stress: No Stress Concern Present (04/21/2023)   [employer] of Occupational Health - Occupational Stress Questionnaire    Feeling of Stress : Not at all  Social Connections: Moderately Integrated (04/21/2023)   Social Connection and Isolation Panel    Frequency of Communication with Friends and  Family: Twice a week    Frequency of Social Gatherings with Friends and Family: Once a week    Attends Religious Services: Never    Database administrator or Organizations: Yes    Attends Banker Meetings: 1 to 4 times per year    Marital Status: Married  Catering manager Violence: Not At Risk (11/02/2022)   Humiliation, Afraid, Rape, and Kick questionnaire    Fear of Current or Ex-Partner: No    Emotionally Abused: No    Physically Abused: No    Sexually Abused: No    Past Surgical History:  Procedure Laterality Date   ABDOMINAL HYSTERECTOMY     age 6 ovaries out, cervix removed too   BREAST LUMPECTOMY WITH RADIOACTIVE SEED AND SENTINEL LYMPH NODE BIOPSY Right 03/28/2019   Procedure: RIGHT BREAST LUMPECTOMY WITH RADIOACTIVE SEED AND SENTINEL LYMPH NODE BIOPSY;  Surgeon: Belinda Cough, MD;  Location: [facility];  Service: General;  Laterality: Right;  PEC BLOCK FOR POST OP PAIN   BREAST SURGERY     CATARACT EXTRACTION, BILATERAL     CHOLECYSTECTOMY     10/2019   COLONOSCOPY     x2   ESOPHAGEAL DILATION     Dr. Obie 15 years from 2021- x2   EXCISION OF BREAST LESION Right 09/14/2023   Procedure: Right chest wall wound exploration;  Surgeon: Belinda Cough, MD;  Location: [facility];  Service: General;  Laterality: Right;   MASTECTOMY W/ SENTINEL NODE BIOPSY Right 09/14/2022   Procedure: RIGHT MASTECTOMY WITH SENTINEL LYMPH NODE BIOPSY;  Surgeon: Belinda Cough, MD;  Location: [facility];  Service: General;  Laterality: Right;  LMA PEC BLOCK   SKIN BIOPSY     04/2022, right arm, left leg   TONSILLECTOMY     68 years old- had trach   TRACHEOSTOMY     68 years old- placed and removed within 24 hours during tonsillectomy   TUBAL LIGATION      Family History  Problem Relation Age of Onset   Hypertension Mother    Thyroid  disease Mother    Breast cancer Mother 10       breast dx age 55 died age 98    Prostate cancer Father 68        metastatic   Hypothyroidism Sister    Stroke Maternal Grandmother    Skin cancer Maternal Uncle        dx. in his 53s   Lung cancer Maternal Uncle        smoker, died age 44  Allergies  Allergen Reactions   Pentazocine Lactate Other (See Comments)    Hallucinations   Tape     If left on long enough causes skin irritation    Demerol [Meperidine] Nausea And Vomiting   Lisinopril  Cough   Nystatin  Rash    Powder or cream   Ozempic  (0.25 Or 0.5 Mg-Dose) [Semaglutide (0.25 Or 0.5mg -Dos)] Diarrhea    Current Outpatient Medications on File Prior to Visit  Medication Sig Dispense Refill   amLODipine  (NORVASC ) 2.5 MG tablet TAKE 1 TABLET BY MOUTH DAILY 90 tablet 3   Ascorbic Acid (VITAMIN C) 1000 MG tablet Take 1,000 mg by mouth daily.     aspirin-acetaminophen -caffeine (EXCEDRIN MIGRAINE) 250-250-65 MG tablet Take 2 tablets by mouth every 8 (eight) hours as needed for headache.     betamethasone  dipropionate 0.05 % cream Apply topically 2 (two) times daily.     Blood Glucose Monitoring Suppl DEVI 1 each by Does not apply route 3 (three) times a week. 1 each 0   Calcium  Carb-Cholecalciferol (CALCIUM  600 + D PO) Take 1 tablet by mouth 2 (two) times daily.     Calcium  Carbonate Antacid 1177 MG CHEW Chew 1,177 mg by mouth at bedtime.     cyanocobalamin  (VITAMIN B12) 1000 MCG tablet Take 1,000 mcg by mouth every other day.     Dextran 70-Hypromellose ([employer] NATURAL TEARS PF OP) Place 1 drop into both eyes See admin instructions. Instill 1 drop into both eyes up to 6 times daily as needed for dry eyes     esomeprazole  (NEXIUM ) 40 MG capsule TAKE 1 CAPSULE(40 MG) BY MOUTH DAILY 90 capsule 3   famotidine  (PEPCID ) 20 MG tablet TAKE 1 TABLET BY MOUTH 2 TIMES A DAY 180 tablet 3   glucosamine-chondroitin 500-400 MG tablet Take 1 tablet by mouth 2 (two) times daily.     Glucose Blood (BLOOD GLUCOSE TEST STRIPS) STRP 1 each by In Vitro route 3 (three) times a week. 100 strip 0   Lancets (ONETOUCH DELICA  PLUS LANCET33G) MISC SMARTSIG:Via Meter 3 Times a Week     Lancets Misc. MISC 1 each by Does not apply route 3 (three) times a week. 100 each 0   leflunomide  (ARAVA ) 20 MG tablet TAKE 1 TABLET BY MOUTH DAILY 90 tablet 0   levothyroxine  (SYNTHROID ) 100 MCG tablet TAKE 1 TABLET BY MOUTH DAILY 30 MINUTES BEFORE FOOD 90 tablet 0   loperamide (IMODIUM A-D) 2 MG tablet Take 4 mg by mouth daily.     loratadine  (CLARITIN ) 10 MG tablet Take 10 mg by mouth daily.     Melatonin 10 MG CAPS Take 10 mg by mouth at bedtime.     Multiple Vitamin (MULTIVITAMIN WITH MINERALS) TABS tablet Take 1 tablet by mouth daily.     olmesartan -hydrochlorothiazide  (BENICAR  HCT) 20-12.5 MG tablet TAKE 1 TABLET BY MOUTH EVERY MORNING 90 tablet 3   Omega-3 Fatty Acids (FISH OIL) 1000 MG CAPS Take 1,000 mg by mouth daily.     OVER THE COUNTER MEDICATION Blink Eye Drops     oxyCODONE  (OXY IR/ROXICODONE ) 5 MG immediate release tablet Take 1 tablet (5 mg total) by mouth every 6 (six) hours as needed for severe pain (pain score 7-10). 15 tablet 0   RESTASIS  MULTIDOSE 0.05 % ophthalmic emulsion SMARTSIG:In Eye(s)     simvastatin  (ZOCOR ) 40 MG tablet Take 1 tablet (40 mg total) by mouth at bedtime. 90 tablet 3   tamoxifen  (NOLVADEX ) 20 MG tablet TAKE 1 TABLET(20 MG) BY MOUTH  DAILY 90 tablet 3   tiZANidine  (ZANAFLEX ) 4 MG tablet TAKE 1 TABLET(4 MG) BY MOUTH EVERY 6 HOURS AS NEEDED FOR MUSCLE SPASMS 30 tablet 1   topiramate  (TOPAMAX ) 100 MG tablet TAKE 1 TABLET(100 MG) BY MOUTH DAILY 90 tablet 3   White Petrolatum-Mineral Oil (SYSTANE NIGHTTIME) OINT Apply to eye.     No current facility-administered medications on file prior to visit.    BP 124/72   Pulse 91   Temp 98 F (36.7 C) (Oral)   Ht 5' 6.25 (1.683 m)   Wt 198 lb 6 oz (90 kg)   SpO2 95%   BMI 31.78 kg/m  Objective:   Physical Exam Vitals and nursing note reviewed.  Constitutional:      Appearance: She is obese.  HENT:     Head: Normocephalic and atraumatic.   Eyes:     Extraocular Movements: Extraocular movements intact.     Conjunctiva/sclera: Conjunctivae normal.  Skin:    General: Skin is warm.  Neurological:     Mental Status: She is alert.  Psychiatric:        Mood and Affect: Mood normal.        Behavior: Behavior normal.           Assessment & Plan:   1. Type 2 diabetes with complication (HCC) (Primary) 2. Obesity (BMI 30-39.9) A1c is improved from 6.7 to 6.4, since patient has been persistently below 7 over the past year, we will not pursue medication at this time. Through process of shared decision making with the patient, she opts for diet and lifestyle control, referral sent to weight management given BMI of 31.  Urine microalbumin ordered since she is due. Diabetic foot check deferred to next diabetic visit in a few months.  She is medically optimized with regards to associated risk factors such as HLD and blood pressure. UTD on eye checks, pneumonia vaccine.  Recommended to get Shingrix  from her pharmacy.  - Amb Ref to Medical Weight Management - Microalbumin/Creatinine Ratio, Urine  3. Need for influenza vaccination - Flu vaccine HIGH DOSE PF(Fluzone Trivalent)   Return in about 4 weeks (around 11/11/2023) for CPE. then 8 weeks after that for diabetic follow up .  Verlie Hellenbrand K Cal Gindlesperger, MD  10/14/23

## 2023-10-14 NOTE — Patient Instructions (Addendum)
 Thank you for visiting [facility] today! Here's what we talked about: - Continue imodium 4mg  for now, after 1-2 wks if improved then reduce to 2mg  for 1-2wks, if improved, just use as needed.  -Get Shingles vaccine from pharmacy

## 2023-10-15 ENCOUNTER — Ambulatory Visit: Payer: Self-pay

## 2023-10-22 DIAGNOSIS — M940 Chondrocostal junction syndrome [Tietze]: Secondary | ICD-10-CM | POA: Diagnosis not present

## 2023-10-22 DIAGNOSIS — S2020XA Contusion of thorax, unspecified, initial encounter: Secondary | ICD-10-CM | POA: Diagnosis not present

## 2023-10-22 DIAGNOSIS — R030 Elevated blood-pressure reading, without diagnosis of hypertension: Secondary | ICD-10-CM | POA: Diagnosis not present

## 2023-10-25 ENCOUNTER — Other Ambulatory Visit: Payer: Self-pay | Admitting: Physician Assistant

## 2023-10-25 DIAGNOSIS — M0579 Rheumatoid arthritis with rheumatoid factor of multiple sites without organ or systems involvement: Secondary | ICD-10-CM

## 2023-10-25 NOTE — Telephone Encounter (Signed)
 Last Fill: 06/27/2023  Labs: 06/22/2023 CBC  WNL  Glucose is 126. Rest of CMP stable.  ESR and CRP WNL   Next Visit: 11/10/2023  Last Visit: 06/22/2023  DX: Rheumatoid arthritis involving multiple sites with positive rheumatoid factor   Current Dose per office note 06/22/2023: Arava  20 mg 1 tablet by mouth daily.   Patient to update labs at upcoming appointment on 11/10/2023.   Okay to refill Arava  ?

## 2023-10-27 NOTE — Progress Notes (Deleted)
 Office Visit Note  Patient: Cheryl Knight             Date of Birth: 1955/07/07           MRN: [id number]             PCP: Bennett Reuben POUR, MD Referring: Hope Merle, MD Visit Date: 11/10/2023 Occupation: Data Unavailable  Subjective:  No chief complaint on file.   History of Present Illness: Cheryl Knight is a 68 y.o. female ***     Activities of Daily Living:  Patient reports morning stiffness for *** {minute/hour:19697}.   Patient {ACTIONS;DENIES/REPORTS:21021675::Denies} nocturnal pain.  Difficulty dressing/grooming: {ACTIONS;DENIES/REPORTS:21021675::Denies} Difficulty climbing stairs: {ACTIONS;DENIES/REPORTS:21021675::Denies} Difficulty getting out of chair: {ACTIONS;DENIES/REPORTS:21021675::Denies} Difficulty using hands for taps, buttons, cutlery, and/or writing: {ACTIONS;DENIES/REPORTS:21021675::Denies}  No Rheumatology ROS completed.   PMFS History:  Patient Active Problem List   Diagnosis Date Noted   Chronic migraine w/o aura w/o status migrainosus, not intractable 12/20/2022   Seroma of breast 11/09/2022   Diarrhea 11/09/2022   Need for influenza vaccination 11/09/2022   Type 2 diabetes with complication (HCC) 11/09/2022   Invasive ductal carcinoma of breast, female, right (HCC) 11/03/2022   Ductal carcinoma in situ (DCIS) of right breast 08/26/2022   ILD (interstitial lung disease) (HCC) 07/05/2022   Fatty liver 06/24/2020   Pulmonary fibrosis (HCC) 10/01/2019   Pulmonary nodules 10/01/2019   Bronchiectasis without complication (HCC) 10/01/2019   Aortic atherosclerosis 10/01/2019   Hypertension associated with diabetes (HCC) 10/01/2019   Paresthesia of upper extremity 10/01/2019   Hiatal hernia 08/16/2019   Dry eye syndrome 08/05/2019   Hypertensive retinopathy 08/05/2019   Arteriosclerosis 08/05/2019   Pterygium 08/05/2019   Ocular rosacea 08/05/2019   Malignant neoplasm of lower-outer quadrant of right breast of female, estrogen  receptor positive (HCC) 03/19/2019   Allergic rhinitis 02/08/2019   Osteoarthritis    Obesity (BMI 30-39.9) 12/30/2015   Basal cell carcinoma of chest wall 10/05/2011   Hyperlipidemia 02/27/2008   Hypothyroidism 02/21/2007   Migraine 02/21/2007   VAGINITIS, ATROPHIC 02/21/2007   Psoriasis 02/21/2007   Sicca syndrome, unspecified 02/21/2007   GERD 06/29/2006   Rheumatoid arthritis (HCC) 06/29/2006    Past Medical History:  Diagnosis Date   Cancer (HCC) 02/2019   right breast IDC   Cataracts, bilateral    tbd surgery 2021   Complication of anesthesia    Cough    Diabetes mellitus without complication (HCC)    Difficult intubation    TRACH AT 19   Dry eye syndrome    Dyspnea    W/ COUGHING- was due to lisinopril  and septic gallbladder   Family history of adverse reaction to anesthesia    sister coded with propofol  use   Family history of breast cancer    Family history of lung cancer    Family history of prostate cancer    Family history of skin cancer    GERD (gastroesophageal reflux disease)    Glaucoma    stable glaucoma suspect 01/19/21 [facility]   Headache    High cholesterol    History of hiatal hernia    History of pneumonia    walking pneumonia years ago   Hypertension    Hypothyroidism    Irritable bowel    Osteoarthritis    i.e right knee    PONV (postoperative nausea and vomiting)    Pre-diabetes    Retinopathy    htn 01/19/21 [facility]   Rheumatoid arthritis (HCC)    Squamous  cell carcinoma of skin of chest    removed   Thyroid  disease     Family History  Problem Relation Age of Onset   Hypertension Mother    Thyroid  disease Mother    Breast cancer Mother 29       breast dx age 1 died age 57    Prostate cancer Father 46       metastatic   Hypothyroidism Sister    Stroke Maternal Grandmother    Skin cancer Maternal Uncle        dx. in his 10s   Lung cancer Maternal Uncle        smoker, died age 38   Past Surgical History:  Procedure  Laterality Date   ABDOMINAL HYSTERECTOMY     age 26 ovaries out, cervix removed too   BREAST LUMPECTOMY WITH RADIOACTIVE SEED AND SENTINEL LYMPH NODE BIOPSY Right 03/28/2019   Procedure: RIGHT BREAST LUMPECTOMY WITH RADIOACTIVE SEED AND SENTINEL LYMPH NODE BIOPSY;  Surgeon: Belinda Cough, MD;  Location: [facility];  Service: General;  Laterality: Right;  PEC BLOCK FOR POST OP PAIN   BREAST SURGERY     CATARACT EXTRACTION, BILATERAL     CHOLECYSTECTOMY     10/2019   COLONOSCOPY     x2   ESOPHAGEAL DILATION     Dr. Obie 15 years from 2021- x2   EXCISION OF BREAST LESION Right 09/14/2023   Procedure: Right chest wall wound exploration;  Surgeon: Belinda Cough, MD;  Location: [facility];  Service: General;  Laterality: Right;   MASTECTOMY W/ SENTINEL NODE BIOPSY Right 09/14/2022   Procedure: RIGHT MASTECTOMY WITH SENTINEL LYMPH NODE BIOPSY;  Surgeon: Belinda Cough, MD;  Location: [facility];  Service: General;  Laterality: Right;  LMA PEC BLOCK   SKIN BIOPSY     04/2022, right arm, left leg   TONSILLECTOMY     68 years old- had trach   TRACHEOSTOMY     68 years old- placed and removed within 24 hours during tonsillectomy   TUBAL LIGATION     Social History   Tobacco Use   Smoking status: Former    Current packs/day: 0.00    Average packs/day: 0.3 packs/day for 7.0 years (1.8 ttl pk-yrs)    Types: Cigarettes    Start date: 66    Quit date: 2000    Years since quitting: 25.7    Passive exposure: Past   Smokeless tobacco: Never  Vaping Use   Vaping status: Never Used  Substance Use Topics   Alcohol  use: Yes    Comment: rare- with birthdays and beach trips   Drug use: Never   Social History   Social History Narrative   Retired Public house manager   Lives with husband, former smoker 1 cigarrette a day but has quit     Immunization History  Administered Date(s) Administered   Fluad Quad(high Dose 65+) 03/11/2020, 10/29/2021   Fluad Trivalent(High Dose 65+)  11/08/2022   H1N1 02/27/2008   INFLUENZA, HIGH DOSE SEASONAL PF 10/14/2023   Influenza-Unspecified 11/18/2017   PFIZER(Purple Top)SARS-COV-2 Vaccination 03/02/2019, 03/29/2019, 01/01/2020   PNEUMOCOCCAL CONJUGATE-20 07/22/2021   Pfizer Covid-19 Vaccine Bivalent Booster 74yrs & up 02/02/2021   Pneumococcal Conjugate-13 10/03/2019   Td 01/19/2004   Tdap 12/17/2014     Objective: Vital Signs: There were no vitals taken for this visit.   Physical Exam   Musculoskeletal Exam: ***  CDAI Exam: CDAI Score: -- Patient Global: --; Provider Global: -- Swollen: --;  Tender: -- Joint Exam 11/10/2023   No joint exam has been documented for this visit   There is currently no information documented on the homunculus. Go to the Rheumatology activity and complete the homunculus joint exam.  Investigation: No additional findings.  Imaging: No results found.  Recent Labs: Lab Results  Component Value Date   WBC 4.7 06/22/2023   HGB 12.7 06/22/2023   PLT 260 06/22/2023   NA 144 09/08/2023   K 4.0 09/08/2023   CL 105 09/08/2023   CO2 23 09/08/2023   GLUCOSE 104 (H) 09/08/2023   BUN 10 09/08/2023   CREATININE 0.61 09/08/2023   BILITOT 0.3 06/22/2023   ALKPHOS 46 03/18/2023   AST 20 06/22/2023   ALT 15 06/22/2023   PROT 7.0 06/22/2023   ALBUMIN 4.1 03/18/2023   CALCIUM  9.5 09/08/2023   GFRAA 108 04/22/2020   QFTBGOLDPLUS NEGATIVE 04/05/2019    Speciality Comments: No specialty comments available.  Procedures:  No procedures performed Allergies: Pentazocine lactate, Tape, Demerol [meperidine], Lisinopril , Nystatin , and Ozempic  (0.25 or 0.5 mg-dose) [semaglutide (0.25 or 0.5mg -dos)]   Assessment / Plan:     Visit Diagnoses: Rheumatoid arthritis involving multiple sites with positive rheumatoid factor (HCC)  Pain in left hip  High risk medication use  ILD (interstitial lung disease) (HCC)  History of asthma  Sicca syndrome  Primary osteoarthritis of both  knees  Primary osteoarthritis of both feet  Psoriasis  History of hyperlipidemia  History of hypothyroidism  Malignant neoplasm of lower-outer quadrant of right breast of female, estrogen receptor positive (HCC)  History of migraine  History of gastroesophageal reflux (GERD)  Orders: No orders of the defined types were placed in this encounter.  No orders of the defined types were placed in this encounter.   Face-to-face time spent with patient was *** minutes. Greater than 50% of time was spent in counseling and coordination of care.  Follow-Up Instructions: No follow-ups on file.   Waddell CHRISTELLA Craze, PA-C  Note - This record has been created using Dragon software.  Chart creation errors have been sought, but may not always  have been located. Such creation errors do not reflect on  the standard of medical care.

## 2023-11-07 ENCOUNTER — Ambulatory Visit: Payer: Medicare HMO

## 2023-11-07 ENCOUNTER — Ambulatory Visit
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
## 2023-11-10 ENCOUNTER — Ambulatory Visit: Payer: Self-pay | Admitting: Physician Assistant

## 2023-11-10 ENCOUNTER — Ambulatory Visit: Admitting: Physician Assistant

## 2023-11-10 ENCOUNTER — Telehealth: Payer: Self-pay

## 2023-11-10 ENCOUNTER — Ambulatory Visit: Attending: Physician Assistant | Admitting: Physician Assistant

## 2023-11-10 ENCOUNTER — Encounter: Payer: Self-pay | Admitting: Physician Assistant

## 2023-11-10 ENCOUNTER — Ambulatory Visit

## 2023-11-10 VITALS — BP 122/74 | HR 74 | Temp 98.0°F | Resp 14 | Ht 67.0 in | Wt 199.6 lb

## 2023-11-10 DIAGNOSIS — M25552 Pain in left hip: Secondary | ICD-10-CM

## 2023-11-10 DIAGNOSIS — C50511 Malignant neoplasm of lower-outer quadrant of right female breast: Secondary | ICD-10-CM | POA: Diagnosis not present

## 2023-11-10 DIAGNOSIS — M1712 Unilateral primary osteoarthritis, left knee: Secondary | ICD-10-CM | POA: Diagnosis not present

## 2023-11-10 DIAGNOSIS — Z8719 Personal history of other diseases of the digestive system: Secondary | ICD-10-CM

## 2023-11-10 DIAGNOSIS — E118 Type 2 diabetes mellitus with unspecified complications: Secondary | ICD-10-CM

## 2023-11-10 DIAGNOSIS — M0579 Rheumatoid arthritis with rheumatoid factor of multiple sites without organ or systems involvement: Secondary | ICD-10-CM | POA: Diagnosis not present

## 2023-11-10 DIAGNOSIS — M170 Bilateral primary osteoarthritis of knee: Secondary | ICD-10-CM

## 2023-11-10 DIAGNOSIS — Z8639 Personal history of other endocrine, nutritional and metabolic disease: Secondary | ICD-10-CM | POA: Diagnosis not present

## 2023-11-10 DIAGNOSIS — Z8669 Personal history of other diseases of the nervous system and sense organs: Secondary | ICD-10-CM

## 2023-11-10 DIAGNOSIS — J849 Interstitial pulmonary disease, unspecified: Secondary | ICD-10-CM

## 2023-11-10 DIAGNOSIS — Z170 Estrogen receptor positive status [ER+]: Secondary | ICD-10-CM

## 2023-11-10 DIAGNOSIS — Z79899 Other long term (current) drug therapy: Secondary | ICD-10-CM | POA: Diagnosis not present

## 2023-11-10 DIAGNOSIS — M1711 Unilateral primary osteoarthritis, right knee: Secondary | ICD-10-CM

## 2023-11-10 DIAGNOSIS — Z8709 Personal history of other diseases of the respiratory system: Secondary | ICD-10-CM

## 2023-11-10 DIAGNOSIS — M3500 Sicca syndrome, unspecified: Secondary | ICD-10-CM | POA: Diagnosis not present

## 2023-11-10 DIAGNOSIS — M19071 Primary osteoarthritis, right ankle and foot: Secondary | ICD-10-CM | POA: Diagnosis not present

## 2023-11-10 DIAGNOSIS — L409 Psoriasis, unspecified: Secondary | ICD-10-CM

## 2023-11-10 DIAGNOSIS — R0789 Other chest pain: Secondary | ICD-10-CM

## 2023-11-10 DIAGNOSIS — M19072 Primary osteoarthritis, left ankle and foot: Secondary | ICD-10-CM

## 2023-11-10 LAB — CBC WITH DIFFERENTIAL/PLATELET
Absolute Lymphocytes: 2475 {cells}/uL (ref 850–3900)
Absolute Monocytes: 614 {cells}/uL (ref 200–950)
Basophils Absolute: 88 {cells}/uL (ref 0–200)
Basophils Relative: 1.7 %
Eosinophils Absolute: 182 {cells}/uL (ref 15–500)
Eosinophils Relative: 3.5 %
HCT: 42.9 % (ref 35.0–45.0)
Hemoglobin: 13.8 g/dL (ref 11.7–15.5)
MCH: 30.5 pg (ref 27.0–33.0)
MCHC: 32.2 g/dL (ref 32.0–36.0)
MCV: 94.9 fL (ref 80.0–100.0)
MPV: 10.7 fL (ref 7.5–12.5)
Monocytes Relative: 11.8 %
Neutro Abs: 1841 {cells}/uL (ref 1500–7800)
Neutrophils Relative %: 35.4 %
Platelets: 281 Thousand/uL (ref 140–400)
RBC: 4.52 Million/uL (ref 3.80–5.10)
RDW: 13 % (ref 11.0–15.0)
Total Lymphocyte: 47.6 %
WBC: 5.2 Thousand/uL (ref 3.8–10.8)

## 2023-11-10 LAB — COMPREHENSIVE METABOLIC PANEL WITH GFR
AG Ratio: 1.7 (calc) (ref 1.0–2.5)
ALT: 17 U/L (ref 6–29)
AST: 23 U/L (ref 10–35)
Albumin: 4.5 g/dL (ref 3.6–5.1)
Alkaline phosphatase (APISO): 42 U/L (ref 37–153)
BUN/Creatinine Ratio: 29 (calc) — ABNORMAL HIGH (ref 6–22)
BUN: 15 mg/dL (ref 7–25)
CO2: 27 mmol/L (ref 20–32)
Calcium: 10.2 mg/dL (ref 8.6–10.4)
Chloride: 106 mmol/L (ref 98–110)
Creat: 0.52 mg/dL — ABNORMAL LOW (ref 0.60–1.00)
Globulin: 2.7 g/dL (ref 1.9–3.7)
Glucose, Bld: 110 mg/dL — ABNORMAL HIGH (ref 65–99)
Potassium: 4.5 mmol/L (ref 3.5–5.3)
Sodium: 142 mmol/L (ref 135–146)
Total Bilirubin: 0.4 mg/dL (ref 0.2–1.2)
Total Protein: 7.2 g/dL (ref 6.1–8.1)
eGFR: 100 mL/min/1.73m2 (ref >=60)

## 2023-11-10 MED ORDER — TIZANIDINE HCL 4 MG PO TABS: 4.0000 mg | ORAL_TABLET | Freq: Every evening | ORAL | 0 refills | Status: AC | PRN

## 2023-11-10 NOTE — Addendum Note (Signed)
 Addended by: CHERYL WADDELL HERO on: 11/10/2023 12:12 PM   Modules accepted: Orders

## 2023-11-10 NOTE — Progress Notes (Signed)
 X-rays of both knees are consistent with severe osteoarthritis and severe chondromalacia patella.  Please notify the patient and please apply for visco for both knees

## 2023-11-10 NOTE — Telephone Encounter (Signed)
 Per Waddell Craze, please apply for bilateral knee visco injections. Thank you.

## 2023-11-11 NOTE — Telephone Encounter (Signed)
 VOB submitted for Orthovisc, Bilateral knee(s) BV pending

## 2023-11-11 NOTE — Telephone Encounter (Signed)
 Please call to schedule visco injections.  Approved for Orthovisc, Bilateral knee(s). Buy & Bill Once OOP has been met $3500 (MET: [phone]) patient is covered at 100% $15 co-pay Deductible not apply No pre-certifications   Confirmed by: Randon SAUNDERS [phone]  Questions call Zebedee FALCON at [phone] ext [phone]

## 2023-11-13 NOTE — Progress Notes (Signed)
CBC WNL. CMP stable.

## 2023-11-14 ENCOUNTER — Encounter

## 2023-11-15 ENCOUNTER — Other Ambulatory Visit: Payer: Self-pay

## 2023-11-15 ENCOUNTER — Ambulatory Visit: Admitting: Nutrition

## 2023-11-15 DIAGNOSIS — E039 Hypothyroidism, unspecified: Secondary | ICD-10-CM

## 2023-11-15 MED ORDER — LEVOTHYROXINE SODIUM 100 MCG PO TABS: ORAL_TABLET | ORAL | 0 refills | Status: DC

## 2023-11-15 NOTE — Telephone Encounter (Signed)
LVM to schedule injections.

## 2023-11-15 NOTE — Telephone Encounter (Signed)
 Copied from CRM [id number]. Topic: Clinical - Medication Refill >> Nov 15, 2023  1:01 PM Alexandria E wrote: Medication: levothyroxine  (SYNTHROID ) 100 MCG tablet  Has the patient contacted their pharmacy? Yes (Agent: If no, request that the patient contact the pharmacy for the refill. If patient does not wish to contact the pharmacy document the reason why and proceed with request.) (Agent: If yes, when and what did the pharmacy advise?)  This is the patient's preferred pharmacy:  [id number] [place], [place] - [address] [place] [address] Phone: [phone] Fax: [phone]  Is this the correct pharmacy for this prescription? Yes If no, delete pharmacy and type the correct one.   Has the prescription been filled recently? No  Is the patient out of the medication? No,1 pill left.  Has the patient been seen for an appointment in the last year OR does the patient have an upcoming appointment? Yes  Can we respond through MyChart? Yes  Agent: Please be advised that Rx refills may take up to 3 business days. We ask that you follow-up with your pharmacy.

## 2023-11-21 ENCOUNTER — Ambulatory Visit

## 2023-11-21 VITALS — BP 112/72 | HR 93 | Temp 98.6°F | Ht 66.0 in | Wt 200.4 lb

## 2023-11-21 DIAGNOSIS — Z136 Encounter for screening for cardiovascular disorders: Secondary | ICD-10-CM

## 2023-11-21 DIAGNOSIS — M8580 Other specified disorders of bone density and structure, unspecified site: Secondary | ICD-10-CM | POA: Diagnosis not present

## 2023-11-21 DIAGNOSIS — G43809 Other migraine, not intractable, without status migrainosus: Secondary | ICD-10-CM

## 2023-11-21 DIAGNOSIS — Z0000 Encounter for general adult medical examination without abnormal findings: Secondary | ICD-10-CM

## 2023-11-21 MED ORDER — RIZATRIPTAN BENZOATE 10 MG PO TABS: 10.0000 mg | ORAL_TABLET | ORAL | 0 refills | Status: AC | PRN

## 2023-11-21 NOTE — Patient Instructions (Addendum)
 Thank you for visiting [facility] today! Here's what we talked about: - Pick up Rizatriptan   - Get COVID booster and RSV from pharmacy, okay to wait a week in between shots - Get Shingles vaccine from pharmacy - Bring your healthcare POA paperwork and living will
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
## 2023-11-28 ENCOUNTER — Encounter: Payer: Self-pay | Admitting: Pharmacist

## 2023-11-28 ENCOUNTER — Other Ambulatory Visit

## 2023-11-28 DIAGNOSIS — Z136 Encounter for screening for cardiovascular disorders: Secondary | ICD-10-CM | POA: Diagnosis not present

## 2023-11-28 LAB — LIPID PANEL
Cholesterol: 164 mg/dL (ref 0–200)
HDL: 35.5 mg/dL — ABNORMAL LOW (ref >39.00)
LDL Cholesterol: 90 mg/dL (ref 0–99)
NonHDL: 128.17
Total CHOL/HDL Ratio: 5
Triglycerides: 192 mg/dL — ABNORMAL HIGH (ref 0.0–149.0)
VLDL: 38.4 mg/dL (ref 0.0–40.0)

## 2023-11-28 NOTE — Progress Notes (Signed)
 Pharmacy Quality Measure Review  This patient is appearing on a report for being at risk of failing the Kidney Health Evaluation for Patients with Diabetea measure this calendar year.   Insurance report not up to date.  UACR has been collected as of: Lab Results  Component Value Date   MICRALBCREAT 13.9 10/14/2023

## 2023-11-30 ENCOUNTER — Encounter: Admitting: Nutrition

## 2023-11-30 ENCOUNTER — Encounter: Payer: Self-pay | Admitting: Nutrition

## 2023-11-30 ENCOUNTER — Ambulatory Visit: Payer: Self-pay

## 2023-11-30 DIAGNOSIS — E118 Type 2 diabetes mellitus with unspecified complications: Secondary | ICD-10-CM | POA: Diagnosis not present

## 2023-11-30 DIAGNOSIS — E6609 Other obesity due to excess calories: Secondary | ICD-10-CM | POA: Insufficient documentation

## 2023-11-30 NOTE — Progress Notes (Signed)
 Medical Nutrition Therapy  Appointment Start time:  1300  Appointment End time:  1400  Primary concerns today: Dm Type 2,   Referral diagnosis: E11.8 Preferred learning style: No preference  Learning readiness: Ready   NUTRITION ASSESSMENT  68 yr old wfemale referred for Type 2 DM and overweight. She is a retired engineer, civil (consulting). Dx of  Type 2 DM during breast cancer treatment. Intolerate of Metformin  , Jardiance ,  Ozempic ,-chronic diarrhea and has been off it for 6 months. Still has some chronic diarrhea issues. Has hyperlipidemia. TG 192 mg/dl. Low HDL 35. Has been working on diet with eating healthier foods. Eats basically 2 meals per day. Tends to stay up late til 1 am and sleep in til 9 and eat 'brunch instead of breakfast earlier. Activity is hunting. Not able to exercise outside of that due to knee issues and back issues. Can't stand for long periods of time. Cooks most meals at home. Willing to consider water aerobics for exercise. She has tested blood sugars and than range in the 110-130's, occassionally 150-160's, always less than 200 mg/dl she reports. She checks glucose 1-2 times per week. Drinks mostly water but does have 2 regular sodas of Dr. Nunzio most weekends.  She is willing to work on eating more whole plant based foods and limiting protein of animal meat to what they kil. Likes most foods.    Clinical Medical Hx:  Past Medical History:  Diagnosis Date   Cancer (HCC) 02/2019   right breast IDC   Cataracts, bilateral    tbd surgery 2021   Complication of anesthesia    Cough    Diabetes mellitus without complication (HCC)    Difficult intubation    TRACH AT 19   Dry eye syndrome    Dyspnea    W/ COUGHING- was due to lisinopril  and septic gallbladder   Family history of adverse reaction to anesthesia    sister coded with propofol  use   Family history of breast cancer    Family history of lung cancer    Family history of prostate cancer    Family history of skin  cancer    GERD (gastroesophageal reflux disease)    Glaucoma    stable glaucoma suspect 01/19/21 [facility]   Headache    High cholesterol    History of hiatal hernia    History of pneumonia    walking pneumonia years ago   Hypertension    Hypothyroidism    Irritable bowel    Osteoarthritis    i.e right knee    PONV (postoperative nausea and vomiting)    Pre-diabetes    Retinopathy    htn 01/19/21 [facility]   Rheumatoid arthritis (HCC)    Shingles    Squamous cell carcinoma of skin of chest    removed   Thyroid  disease     Medications:  Current Outpatient Medications on File Prior to Visit  Medication Sig Dispense Refill   amLODipine  (NORVASC ) 2.5 MG tablet TAKE 1 TABLET BY MOUTH DAILY 90 tablet 3   Ascorbic Acid (VITAMIN C) 1000 MG tablet Take 1,000 mg by mouth daily.     aspirin-acetaminophen -caffeine (EXCEDRIN MIGRAINE) 250-250-65 MG tablet Take 2 tablets by mouth every 8 (eight) hours as needed for headache.     betamethasone  dipropionate 0.05 % cream Apply topically 2 (two) times daily.     Blood Glucose Monitoring Suppl DEVI 1 each by Does not apply route 3 (three) times a week. 1 each 0  Calcium  Carb-Cholecalciferol (CALCIUM  600 + D PO) Take 1 tablet by mouth 2 (two) times daily.     Calcium  Carbonate Antacid 1177 MG CHEW Chew 1,177 mg by mouth at bedtime.     cyanocobalamin  (VITAMIN B12) 1000 MCG tablet Take 1,000 mcg by mouth every other day.     Dextran 70-Hypromellose ([employer] NATURAL TEARS PF OP) Place 1 drop into both eyes See admin instructions. Instill 1 drop into both eyes up to 6 times daily as needed for dry eyes     esomeprazole  (NEXIUM ) 40 MG capsule TAKE 1 CAPSULE(40 MG) BY MOUTH DAILY 90 capsule 3   famotidine  (PEPCID ) 20 MG tablet TAKE 1 TABLET BY MOUTH 2 TIMES A DAY 180 tablet 3   glucosamine-chondroitin 500-400 MG tablet Take 1 tablet by mouth 2 (two) times daily.     Glucose Blood (BLOOD GLUCOSE TEST STRIPS) STRP 1 each by In Vitro route 3 (three)  times a week. 100 strip 0   Lancets (ONETOUCH DELICA PLUS LANCET33G) MISC SMARTSIG:Via Meter 3 Times a Week     Lancets Misc. MISC 1 each by Does not apply route 3 (three) times a week. 100 each 0   leflunomide  (ARAVA ) 20 MG tablet TAKE 1 TABLET BY MOUTH DAILY 90 tablet 0   levothyroxine  (SYNTHROID ) 100 MCG tablet TAKE 1 TABLET BY MOUTH DAILY 30 MINUTES BEFORE FOOD 90 tablet 0   loperamide (IMODIUM A-D) 2 MG tablet Take 4 mg by mouth daily.     loratadine  (CLARITIN ) 10 MG tablet Take 10 mg by mouth daily.     Melatonin 10 MG CAPS Take 10 mg by mouth at bedtime.     Multiple Vitamin (MULTIVITAMIN WITH MINERALS) TABS tablet Take 1 tablet by mouth daily.     naproxen (NAPROSYN) 500 MG tablet Take 500 mg by mouth 2 (two) times daily.     olmesartan -hydrochlorothiazide  (BENICAR  HCT) 20-12.5 MG tablet TAKE 1 TABLET BY MOUTH EVERY MORNING 90 tablet 3   Omega-3 Fatty Acids (FISH OIL) 1000 MG CAPS Take 1,000 mg by mouth daily.     OVER THE COUNTER MEDICATION Blink Eye Drops     oxyCODONE  (OXY IR/ROXICODONE ) 5 MG immediate release tablet Take 1 tablet (5 mg total) by mouth every 6 (six) hours as needed for severe pain (pain score 7-10). 15 tablet 0   RESTASIS  MULTIDOSE 0.05 % ophthalmic emulsion SMARTSIG:In Eye(s)     rizatriptan  (MAXALT ) 10 MG tablet Take 1 tablet (10 mg total) by mouth as needed for migraine. May repeat in 2 hours if needed 90 tablet 0   simvastatin  (ZOCOR ) 40 MG tablet Take 1 tablet (40 mg total) by mouth at bedtime. 90 tablet 3   tamoxifen  (NOLVADEX ) 20 MG tablet TAKE 1 TABLET(20 MG) BY MOUTH DAILY 90 tablet 3   tiZANidine  (ZANAFLEX ) 4 MG tablet Take 1 tablet (4 mg total) by mouth at bedtime as needed for muscle spasms. 30 tablet 0   topiramate  (TOPAMAX ) 100 MG tablet TAKE 1 TABLET(100 MG) BY MOUTH DAILY 90 tablet 3   White Petrolatum-Mineral Oil (SYSTANE NIGHTTIME) OINT Apply to eye.     No current facility-administered medications on file prior to visit.    Labs:  Lab Results   Component Value Date   HGBA1C 6.4 (H) 09/30/2023      Latest Ref Rng & Units 11/10/2023   10:41 AM 09/08/2023    9:18 AM 06/22/2023    9:35 AM  CMP  Glucose 65 - 99 mg/dL 889  895  126   BUN 7 - 25 mg/dL 15  10  12    Creatinine 0.60 - 1.00 mg/dL 9.47  9.38  9.52   Sodium 135 - 146 mmol/L 142  144  142   Potassium 3.5 - 5.3 mmol/L 4.5  4.0  3.6   Chloride 98 - 110 mmol/L 106  105  107   CO2 20 - 32 mmol/L 27  23  25    Calcium  8.6 - 10.4 mg/dL 89.7  9.5  9.6   Total Protein 6.1 - 8.1 g/dL 7.2   7.0   Total Bilirubin 0.2 - 1.2 mg/dL 0.4   0.3   AST 10 - 35 U/L 23   20   ALT 6 - 29 U/L 17   15    Lipid Panel     Component Value Date/Time   CHOL 164 11/28/2023 0922   CHOL 193 12/30/2020 0931   TRIG 192.0 (H) 11/28/2023 0922   HDL 35.50 (L) 11/28/2023 0922   HDL 47 12/30/2020 0931   CHOLHDL 5 11/28/2023 0922   VLDL 38.4 11/28/2023 0922   LDLCALC 90 11/28/2023 0922   LDLCALC 107 (H) 12/30/2020 0931   LDLDIRECT 147.9 11/20/2012 0838   LABVLDL 39 12/30/2020 0931    Notable Signs/Symptoms: None  Lifestyle & Dietary Hx Married and lives with husband. Eats 2 meals and occassional snacks Eats' a brunch  Estimated daily fluid intake: 100 oz Supplements: Calcium , Vit B12, MVI, GLucosamine, Vit C,  Sleep: Melatonin Stress / self-care: doesn't sleep well  Current average weekly physical activity: Very active outdoors,  24-Hr Dietary Recall Brunch: 11 am- eggs, toast, fruit or misc depending on what she is hungry to eat. Does do some oatmeal. Third Meal: Cube venison, mashed potatoes, water Snack:  Beverages: water; 2 sodas on weekends.  Estimated Energy Needs Calories: 1500 Carbohydrate: 170g Protein: 112g Fat: 42g   NUTRITION DIAGNOSIS  NB-1.1 Food and nutrition-related knowledge deficit As related to inconsistent diet to meet her nutritional needs and blood sugars.  As evidenced by diet recall and A1C 6.4%..   NUTRITION INTERVENTION  Nutrition education (E-1) on  the following topics:  Nutrition and Diabetes education provided on My Plate, CHO counting, meal planning, portion sizes, timing of meals, avoiding snacks between meals unless having a low blood sugar, target ranges for A1C and blood sugars, signs/symptoms and treatment of hyper/hypoglycemia, monitoring blood sugars, taking medications as prescribed, benefits of exercising 30 minutes per day and prevention of complications of DM.  Lifestyle Medicine  - Whole Food, Plant Predominant Nutrition is highly recommended: Eat Plenty of vegetables, Mushrooms, fruits, Legumes, Whole Grains, Nuts, seeds in lieu of processed meats, processed snacks/pastries red meat, poultry, eggs.    -It is better to avoid simple carbohydrates including: Cakes, Sweet Desserts, Ice Cream, Soda (diet and regular), Sweet Tea, Candies, Chips, Cookies, Store Bought Juices, Alcohol  in Excess of  1-2 drinks a day, Lemonade,  Artificial Sweeteners, Doughnuts, Coffee Creamers, Sugar-free Products, etc, etc.  This is not a complete list.....  Exercise: If you are able: 30 -60 minutes a day ,4 days a week, or 150 minutes a week.  The longer the better.  Combine stretch, strength, and aerobic activities.  If you were told in the past that you have high risk for cardiovascular diseases, you may seek evaluation by your heart doctor prior to initiating moderate to intense exercise programs.   Handouts Provided Include  Lifestyle Medicine handouts Meal Plan Card  Learning Style & Readiness for Change  Teaching method utilized: Special Educational Needs Teacher  Demonstrated degree of understanding via: Teach Back  Barriers to learning/adherence to lifestyle change: physical issues with legs and back for more exercise  Goals Established by Pt Work on cutting out sodas Work on portion [employer] three meals per day instead of 2 per day Focus on whole plant based foods Get in pool when able. Get A1C down to 5.7% or below.   MONITORING &  EVALUATION Dietary intake, weekly physical activity, and blood sugars in PRN.  Next Steps  Patient is to work on eating 3 better balanced meals and consider water aerobics for exercise.SABRA

## 2023-11-30 NOTE — Patient Instructions (Addendum)
 Goals Work on cutting out sodas. Work on portion [employer] three meals per day instead of 2 per day Focus on whole plant based foods Get in pool when able. Get A1C down to 5.7% or below.

## 2023-12-07 DIAGNOSIS — M778 Other enthesopathies, not elsewhere classified: Secondary | ICD-10-CM | POA: Diagnosis not present

## 2023-12-07 DIAGNOSIS — M05731 Rheumatoid arthritis with rheumatoid factor of right wrist without organ or systems involvement: Secondary | ICD-10-CM | POA: Diagnosis not present

## 2023-12-07 DIAGNOSIS — M1711 Unilateral primary osteoarthritis, right knee: Secondary | ICD-10-CM | POA: Diagnosis not present

## 2023-12-09 ENCOUNTER — Ambulatory Visit

## 2023-12-12 ENCOUNTER — Telehealth: Payer: Self-pay | Admitting: Physician Assistant

## 2023-12-12 NOTE — Telephone Encounter (Signed)
 Contacted the patient, patient scheduled tomorrow at 3:00 with Dr. Dolphus.

## 2023-12-12 NOTE — Telephone Encounter (Signed)
 Patient is calling stating she is having flares and swelling in hands. Pt would like to talk to someone in clinic to see what her next steps should be. Pt is requesting a call back at [phone].

## 2023-12-12 NOTE — Telephone Encounter (Signed)
 Patient contacted the office and states she has had 3 flares last two weeks Patient states these flares have affected both wrists, which made her hands unusable, and her right knee. Patient states she went to [facility] and was given prednisone  taper but it has not helped her a lot. Patient states she is still taking her Arava  how she is supposed to be taking it. Please advise.

## 2023-12-12 NOTE — Telephone Encounter (Signed)
 Please clarify if the patient is able to come in for a visit with Dr. Dolphus tomorrow to discuss medication options

## 2023-12-13 ENCOUNTER — Encounter: Payer: Self-pay | Admitting: Rheumatology

## 2023-12-13 ENCOUNTER — Ambulatory Visit (INDEPENDENT_AMBULATORY_CARE_PROVIDER_SITE_OTHER)

## 2023-12-13 ENCOUNTER — Ambulatory Visit: Payer: Self-pay

## 2023-12-13 ENCOUNTER — Ambulatory Visit: Attending: Rheumatology | Admitting: Rheumatology

## 2023-12-13 VITALS — BP 122/78 | HR 90 | Temp 97.7°F | Ht 66.0 in | Wt 199.0 lb

## 2023-12-13 VITALS — BP 119/79 | HR 89 | Temp 97.9°F | Resp 16 | Ht 66.5 in | Wt 199.4 lb

## 2023-12-13 DIAGNOSIS — Z79899 Other long term (current) drug therapy: Secondary | ICD-10-CM

## 2023-12-13 DIAGNOSIS — E119 Type 2 diabetes mellitus without complications: Secondary | ICD-10-CM

## 2023-12-13 DIAGNOSIS — M3500 Sicca syndrome, unspecified: Secondary | ICD-10-CM

## 2023-12-13 DIAGNOSIS — M170 Bilateral primary osteoarthritis of knee: Secondary | ICD-10-CM | POA: Diagnosis not present

## 2023-12-13 DIAGNOSIS — Z8639 Personal history of other endocrine, nutritional and metabolic disease: Secondary | ICD-10-CM | POA: Diagnosis not present

## 2023-12-13 DIAGNOSIS — R0789 Other chest pain: Secondary | ICD-10-CM | POA: Diagnosis not present

## 2023-12-13 DIAGNOSIS — Z8669 Personal history of other diseases of the nervous system and sense organs: Secondary | ICD-10-CM

## 2023-12-13 DIAGNOSIS — E118 Type 2 diabetes mellitus with unspecified complications: Secondary | ICD-10-CM

## 2023-12-13 DIAGNOSIS — M19072 Primary osteoarthritis, left ankle and foot: Secondary | ICD-10-CM

## 2023-12-13 DIAGNOSIS — J849 Interstitial pulmonary disease, unspecified: Secondary | ICD-10-CM

## 2023-12-13 DIAGNOSIS — Z8719 Personal history of other diseases of the digestive system: Secondary | ICD-10-CM | POA: Diagnosis not present

## 2023-12-13 DIAGNOSIS — M19071 Primary osteoarthritis, right ankle and foot: Secondary | ICD-10-CM | POA: Diagnosis not present

## 2023-12-13 DIAGNOSIS — Z7185 Encounter for immunization safety counseling: Secondary | ICD-10-CM

## 2023-12-13 DIAGNOSIS — M0579 Rheumatoid arthritis with rheumatoid factor of multiple sites without organ or systems involvement: Secondary | ICD-10-CM | POA: Diagnosis not present

## 2023-12-13 DIAGNOSIS — L409 Psoriasis, unspecified: Secondary | ICD-10-CM

## 2023-12-13 DIAGNOSIS — Z8709 Personal history of other diseases of the respiratory system: Secondary | ICD-10-CM

## 2023-12-13 DIAGNOSIS — E785 Hyperlipidemia, unspecified: Secondary | ICD-10-CM

## 2023-12-13 DIAGNOSIS — C50511 Malignant neoplasm of lower-outer quadrant of right female breast: Secondary | ICD-10-CM | POA: Diagnosis not present

## 2023-12-13 DIAGNOSIS — Z170 Estrogen receptor positive status [ER+]: Secondary | ICD-10-CM

## 2023-12-13 LAB — POCT GLYCOSYLATED HEMOGLOBIN (HGB A1C): Hemoglobin A1C: 6.5 % — AB (ref 4.0–5.6)

## 2023-12-13 MED ORDER — PREDNISONE 5 MG PO TABS: ORAL_TABLET | ORAL | 0 refills | Status: AC

## 2023-12-13 NOTE — Progress Notes (Signed)
 Subjective:   This visit was conducted in person. The patient gave informed consent to the use of Abridge AI technology to record the contents of the encounter as documented below.   Patient ID: Cheryl Knight, female    DOB: 22-Feb-1955, 68 y.o.   MRN: [id number]   Cheryl Knight is a very pleasant 68 y.o. female who presents today for DM visit.  Discussed the use of AI scribe software for clinical note transcription with the patient, who gave verbal consent to proceed.  History of Present Illness Cheryl Knight is a 68 year old female with rheumatoid arthritis and diabetes who presents with joint pain and elevated blood sugar levels.  She has been experiencing significant joint pain, particularly in her knees, arm, and hand, for approximately three weeks. Her knees are described as 'killing' her. She visited an orthopedic urgent care and was prescribed a five-day course of steroids, starting at 6 mg and tapering down to 1 mg, which she completed yesterday. While the steroids helped, it took longer for her hand to regain mobility. Her husband had to assist her due to the severity of her symptoms.  She has a history of rheumatoid arthritis and is currently experiencing 'breakthrough hot spots.' She has an appointment with her rheumatologist today to address these concerns.  Regarding her diabetes, her blood sugar levels have been slightly elevated, with a highest reading of 172 mg/dL after meals. Her A1c is 6.5, slightly up from 6.4 a few months ago. She is not currently on any diabetes medications due to previous adverse effects, including diarrhea from metformin  and yeast infections from Jardiance . She manages her diabetes primarily through diet and lifestyle modifications, checking her blood sugar every other day to every third day, especially when experiencing blurred vision, which she attributes to dry eyes.  She experiences occasional numbness and tingling, which she  describes as positional and occurring once or twice a month. She also reports frequent bowel movements, which have improved by 50% since stopping Imodium and using kaopectate as needed. Her bowel movements are now semi-formed, occurring six to seven times a day.  Her social history includes being very active, enjoying hunting, and not being able to sit still unless her symptoms are severe. She has a supportive family, including her husband and sons, who assist her when needed.  -Denies polydipsia, polyuria, weight loss, fatigue or any LE wounds.  Diet: Improving Weight concerns: Has not been able to exercise due to bad knee   Review of Systems       Past Medical History:  Diagnosis Date   Cancer (HCC) 02/2019   right breast IDC   Cataracts, bilateral    tbd surgery 2021   Complication of anesthesia    Cough    Diabetes mellitus without complication (HCC)    Difficult intubation    TRACH AT 19   Dry eye syndrome    Dyspnea    W/ COUGHING- was due to lisinopril  and septic gallbladder   Family history of adverse reaction to anesthesia    sister coded with propofol  use   Family history of breast cancer    Family history of lung cancer    Family history of prostate cancer    Family history of skin cancer    GERD (gastroesophageal reflux disease)    Glaucoma    stable glaucoma suspect 01/19/21 [facility]   Headache    High cholesterol    History of hiatal hernia  History of pneumonia    walking pneumonia years ago   Hypertension    Hypothyroidism    Irritable bowel    Osteoarthritis    i.e right knee    PONV (postoperative nausea and vomiting)    Pre-diabetes    Retinopathy    htn 01/19/21 [facility]   Rheumatoid arthritis (HCC)    Shingles    Squamous cell carcinoma of skin of chest    removed   Thyroid  disease     Social History   Socioeconomic History   Marital status: Married    Spouse name: Not on file   Number of children: 2   Years of education:  Not on file   Highest education level: Associate degree: occupational, scientist, product/process development, or vocational program  Occupational History   Occupation: nurse  Tobacco Use   Smoking status: Former    Current packs/day: 0.00    Average packs/day: 0.3 packs/day for 7.0 years (1.8 ttl pk-yrs)    Types: Cigarettes    Start date: 39    Quit date: 2000    Years since quitting: 25.9    Passive exposure: Past   Smokeless tobacco: Never  Vaping Use   Vaping status: Never Used  Substance and Sexual Activity   Alcohol  use: Yes    Comment: rare- with birthdays and beach trips   Drug use: Never   Sexual activity: Yes    Birth control/protection: None  Other Topics Concern   Not on file  Social History Narrative   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   Lives with husband, former smoker 1 cigarrette a day but has quit   Social Drivers of Corporate Investment Banker Strain: Low Risk  (04/21/2023)   Overall Financial Resource Strain (CARDIA)    Difficulty of Paying Living Expenses: Not hard at all  Food Insecurity: No Food Insecurity (04/21/2023)   Hunger Vital Sign    Worried About Running Out of Food in the Last Year: Never true    Ran Out of Food in the Last Year: Never true  Transportation Needs: No Transportation Needs (04/21/2023)   PRAPARE - Administrator, Civil Service (Medical): No    Lack of Transportation (Non-Medical): No  Physical Activity: Insufficiently Active (04/21/2023)   Exercise Vital Sign    Days of Exercise per Week: 2 days    Minutes of Exercise per Session: 20 min  Stress: No Stress Concern Present (04/21/2023)   [employer] of Occupational Health - Occupational Stress Questionnaire    Feeling of Stress : Not at all  Social Connections: Moderately Integrated (04/21/2023)   Social Connection and Isolation Panel    Frequency of Communication with Friends and Family: Twice a week    Frequency of Social Gatherings with Friends and Family: Once a week    Attends Religious Services: Never     Database Administrator or Organizations: Yes    Attends Banker Meetings: 1 to 4 times per year    Marital Status: Married  Catering Manager Violence: Not At Risk (11/02/2022)   Humiliation, Afraid, Rape, and Kick questionnaire    Fear of Current or Ex-Partner: No    Emotionally Abused: No    Physically Abused: No    Sexually Abused: No    Past Surgical History:  Procedure Laterality Date   ABDOMINAL HYSTERECTOMY     age 65 ovaries out, cervix removed too   BREAST LUMPECTOMY WITH RADIOACTIVE SEED AND SENTINEL LYMPH NODE BIOPSY Right 03/28/2019  Procedure: RIGHT BREAST LUMPECTOMY WITH RADIOACTIVE SEED AND SENTINEL LYMPH NODE BIOPSY;  Surgeon: Belinda Cough, MD;  Location: [facility];  Service: General;  Laterality: Right;  PEC BLOCK FOR POST OP PAIN   BREAST SURGERY     CATARACT EXTRACTION, BILATERAL     CHOLECYSTECTOMY     10/2019   COLONOSCOPY     x2   ESOPHAGEAL DILATION     Dr. Obie 15 years from 2021- x2   EXCISION OF BREAST LESION Right 09/14/2023   Procedure: Right chest wall wound exploration;  Surgeon: Belinda Cough, MD;  Location: [facility];  Service: General;  Laterality: Right;   MASTECTOMY W/ SENTINEL NODE BIOPSY Right 09/14/2022   Procedure: RIGHT MASTECTOMY WITH SENTINEL LYMPH NODE BIOPSY;  Surgeon: Belinda Cough, MD;  Location: [facility];  Service: General;  Laterality: Right;  LMA PEC BLOCK   SKIN BIOPSY     04/2022, right arm, left leg   TONSILLECTOMY     68 years old- had trach   TRACHEOSTOMY     68 years old- placed and removed within 24 hours during tonsillectomy   TUBAL LIGATION      Family History  Problem Relation Age of Onset   Hypertension Mother    Thyroid  disease Mother    Breast cancer Mother 91       breast dx age 13 died age 101    Prostate cancer Father 54       metastatic   Hypothyroidism Sister    Stroke Maternal Grandmother    Skin cancer Maternal Uncle        dx. in his 20s   Lung cancer  Maternal Uncle        smoker, died age 43    Allergies  Allergen Reactions   Pentazocine Other (See Comments)    pentazocine   Pentazocine Lactate Other (See Comments)    Hallucinations   Tape     If left on long enough causes skin irritation    Lisinopril  Cough   Meperidine Nausea And Vomiting, Nausea Only and Other (See Comments)    meperidine   Nystatin  Rash and Dermatitis    Powder or cream  nystatin    Ozempic  (0.25 Or 0.5 Mg-Dose) [Semaglutide (0.25 Or 0.5mg -Dos)] Diarrhea    Current Outpatient Medications on File Prior to Visit  Medication Sig Dispense Refill   amLODipine  (NORVASC ) 2.5 MG tablet TAKE 1 TABLET BY MOUTH DAILY 90 tablet 3   Ascorbic Acid (VITAMIN C) 1000 MG tablet Take 1,000 mg by mouth daily.     aspirin-acetaminophen -caffeine (EXCEDRIN MIGRAINE) 250-250-65 MG tablet Take 2 tablets by mouth every 8 (eight) hours as needed for headache.     betamethasone  dipropionate 0.05 % cream Apply topically 2 (two) times daily.     Blood Glucose Monitoring Suppl DEVI 1 each by Does not apply route 3 (three) times a week. 1 each 0   Calcium  Carb-Cholecalciferol (CALCIUM  600 + D PO) Take 1 tablet by mouth 2 (two) times daily.     Calcium  Carbonate Antacid 1177 MG CHEW Chew 1,177 mg by mouth at bedtime.     cyanocobalamin  (VITAMIN B12) 1000 MCG tablet Take 1,000 mcg by mouth every other day.     Dextran 70-Hypromellose ([employer] NATURAL TEARS PF OP) Place 1 drop into both eyes See admin instructions. Instill 1 drop into both eyes up to 6 times daily as needed for dry eyes     esomeprazole  (NEXIUM ) 40 MG capsule TAKE 1 CAPSULE(40  MG) BY MOUTH DAILY 90 capsule 3   famotidine  (PEPCID ) 20 MG tablet TAKE 1 TABLET BY MOUTH 2 TIMES A DAY 180 tablet 3   glucosamine-chondroitin 500-400 MG tablet Take 1 tablet by mouth 2 (two) times daily.     Glucose Blood (BLOOD GLUCOSE TEST STRIPS) STRP 1 each by In Vitro route 3 (three) times a week. 100 strip 0   Lancets (ONETOUCH DELICA PLUS  LANCET33G) MISC SMARTSIG:Via Meter 3 Times a Week     Lancets Misc. MISC 1 each by Does not apply route 3 (three) times a week. 100 each 0   leflunomide  (ARAVA ) 20 MG tablet TAKE 1 TABLET BY MOUTH DAILY 90 tablet 0   levothyroxine  (SYNTHROID ) 100 MCG tablet TAKE 1 TABLET BY MOUTH DAILY 30 MINUTES BEFORE FOOD 90 tablet 0   loperamide (IMODIUM A-D) 2 MG tablet Take 4 mg by mouth daily.     loratadine  (CLARITIN ) 10 MG tablet Take 10 mg by mouth daily.     Melatonin 10 MG CAPS Take 10 mg by mouth at bedtime.     Multiple Vitamin (MULTIVITAMIN WITH MINERALS) TABS tablet Take 1 tablet by mouth daily.     naproxen (NAPROSYN) 500 MG tablet Take 500 mg by mouth 2 (two) times daily.     olmesartan -hydrochlorothiazide  (BENICAR  HCT) 20-12.5 MG tablet TAKE 1 TABLET BY MOUTH EVERY MORNING 90 tablet 3   Omega-3 Fatty Acids (FISH OIL) 1000 MG CAPS Take 1,000 mg by mouth daily.     OVER THE COUNTER MEDICATION Blink Eye Drops     oxyCODONE  (OXY IR/ROXICODONE ) 5 MG immediate release tablet Take 1 tablet (5 mg total) by mouth every 6 (six) hours as needed for severe pain (pain score 7-10). 15 tablet 0   RESTASIS  MULTIDOSE 0.05 % ophthalmic emulsion SMARTSIG:In Eye(s)     rizatriptan  (MAXALT ) 10 MG tablet Take 1 tablet (10 mg total) by mouth as needed for migraine. May repeat in 2 hours if needed 90 tablet 0   simvastatin  (ZOCOR ) 40 MG tablet Take 1 tablet (40 mg total) by mouth at bedtime. 90 tablet 3   tamoxifen  (NOLVADEX ) 20 MG tablet TAKE 1 TABLET(20 MG) BY MOUTH DAILY 90 tablet 3   tiZANidine  (ZANAFLEX ) 4 MG tablet Take 1 tablet (4 mg total) by mouth at bedtime as needed for muscle spasms. 30 tablet 0   topiramate  (TOPAMAX ) 100 MG tablet TAKE 1 TABLET(100 MG) BY MOUTH DAILY 90 tablet 3   White Petrolatum-Mineral Oil (SYSTANE NIGHTTIME) OINT Apply to eye.     No current facility-administered medications on file prior to visit.    BP 122/78 (BP Location: Left Arm, Patient Position: Sitting, Cuff Size: Large)    Pulse 90   Temp 97.7 F (36.5 C) (Oral)   Ht 5' 6 (1.676 m)   Wt 199 lb (90.3 kg)   SpO2 95%   BMI 32.12 kg/m   Objective:     Physical Exam GENERAL: Alert, cooperative, well developed, no acute distress. Obese HEAD: Normocephalic atraumatic. EYES: Extraocular movements intact BL, pupils round, equal and reactive to light BL, conjunctivae normal BL. EXTREMITIES: Right knee swollen. No cyanosis or edema. NEUROLOGICAL: Oriented to person, place and time, no gait abnormalities, moves all extremities without gross motor or sensory deficit. Sensation intact in feet.     Physical Exam Vitals and nursing note reviewed.  Constitutional:      Appearance: Normal appearance.  Cardiovascular:     Pulses:  Dorsalis pedis pulses are 2+ on the right side and 2+ on the left side.       Posterior tibial pulses are 2+ on the right side and 2+ on the left side.  Feet:     Right foot:     Protective Sensation: 10 sites tested.  10 sites sensed.     Skin integrity: Skin integrity normal.     Toenail Condition: Right toenails are normal.     Left foot:     Protective Sensation: 10 sites tested.  10 sites sensed.     Skin integrity: Skin integrity normal.     Toenail Condition: Left toenails are normal.  Neurological:     Mental Status: She is alert.          Assessment & Plan:   Assessment & Plan Type 2 diabetes mellitus Recent hyperglycemia due to steroid use from [facility]. A1c stable at 6.5. Postprandial glucose home check reportedly elevated, expected to normalize post-steroid.   Current A1c: 6.5 Last A1c: 6.4 Med regimen: Diet and lifestyle Urine microalbumin: UTD WNL Eye exam: UTD Foot exam: Done today  Ace-i/ARB: Yes  Statin: Yes Influenza and Pneumococcal vaccines: UTD Neuropathy: Possibly Nephropathy: No Retinopathy: No  - Continue current diabetes management with diet and lifestyle. - Next diabetic visit in six months given several reading within  goal. - Repeat foot exam in one year.  HLD Well controlled on Simvastatin , no changes at this time.  -Continue Simvastatin  40mg  daily  Vaccine counseling Up to date on flu and pneumonia vaccines. Needs Covid and shingles boosters.    Return in about 6 months (around 06/11/2024) for Diabetic visit .  Cheryl Mckesson K Cheryl Cortright, MD  12/13/23    Contains text generated by [employer].

## 2023-12-13 NOTE — Patient Instructions (Addendum)
 Thank you for visiting [facility] today! Here's what we talked about: - Continue diet and lifestyle changes - Remember shingles and covid booster from pharmacy

## 2023-12-13 NOTE — Patient Instructions (Addendum)
 Abatacept Injection What is this medication? ABATACEPT (a ba TA sept) treats autoimmune conditions, such as arthritis. It may also be used to treat a condition that can occur after a stem cell or bone marrow transplant (chronic graft versus host disease). It works by slowing down an overactive immune system. This medicine may be used for other purposes; ask your health care provider or pharmacist if you have questions. COMMON BRAND NAME(S): Orencia, Orencia ClickJect What should I tell my care team before I take this medication? They need to know if you have any of these conditions: Cancer Diabetes Having surgery Hepatitis B or history of hepatitis B infection Immune system problems Infection, especially a viral infection, such as chickenpox, cold sores, herpes Lung or breathing problems, such as asthma or COPD Recent or upcoming vaccine Tuberculosis, a positive skin test for tuberculosis, or have recently been in close contact with someone who has tuberculosis An unusual or allergic reaction to abatacept, other medications, foods, dyes, or preservatives Pregnant or trying to get pregnant Breastfeeding How should I use this medication? This medication is infused into a vein or injected under the skin. Infusions are given by your care team in a hospital or clinic setting. It may be injected under the skin at home. If you get this medication at home, you will be taught how to prepare and give it. Use exactly as directed. Take it as directed on the prescription label. Keep taking it unless your care team tells you to stop. It is important that you put your used needles and syringes in a special sharps container. Do not put them in a trash can. If you do not have a sharps container, call your pharmacist or care team to get one. Talk to your care team about the use of this medication in children. While it may be prescribed for children as young as 2 years for selected conditions, precautions do  apply. Overdosage: If you think you have taken too much of this medicine contact a poison control center or emergency room at once. NOTE: This medicine is only for you. Do not share this medicine with others. What if I miss a dose? If you miss a dose, take it as soon as you can. If it is almost time for your next dose, take only that dose. Do not take double or extra doses. What may interact with this medication? Do not take this medication with any of the following: Live virus vaccines This medication may also interact with the following: Anakinra Baricitinib Canakinumab Medications that lower your chance of fighting an infection Rituximab TNF blockers, such as adalimumab, certolizumab, etanercept, golimumab, infliximab Tocilizumab Tofacitinib Upadacitinib Ustekinumab This list may not describe all possible interactions. Give your health care provider a list of all the medicines, herbs, non-prescription drugs, or dietary supplements you use. Also tell them if you smoke, drink alcohol , or use illegal drugs. Some items may interact with your medicine. What should I watch for while using this medication? Visit your care team for regular checks on your progress. Tell your care team if your symptoms do not start to get better or if they get worse. You will be tested for tuberculosis (TB) before you start this medication. If your care team prescribed any medication for TB, you should start taking the TB medication before starting this medication. Make sure to finish the full course of TB medication. This medication may increase your risk of getting an infection. Call your care team if you get fever,  chills, or sore throat, or other symptoms of a cold or flu. Do not treat yourself. Try to avoid being around people who are sick. If you have diabetes and are getting this medication as an infusion, it may give false high blood sugar readings on the day of your dose. This may happen if you use certain  types of blood glucose tests. Your care team may tell you to use a different way to monitor your blood sugar levels. What side effects may I notice from receiving this medication? Side effects that you should report to your care team as soon as possible: Allergic reactions--skin rash, itching, hives, swelling of the face, lips, tongue, or throat Infection--fever, chills, cough, sore throat, wounds that don't heal, pain or trouble when passing urine, general feeling of discomfort or being unwell Side effects that usually do not require medical attention (report to your care team if they continue or are bothersome): Back pain Cough Dizziness Headache Runny or stuffy nose Sore throat This list may not describe all possible side effects. Call your doctor for medical advice about side effects. You may report side effects to FDA at 1-800-FDA-1088. Where should I keep my medication? If you take this medication at home, keep out of the reach of children and pets. Store in the refrigerator. Keep this medication in the original container. Protect from light. Do not freeze. Do not shake. Get rid of any unused medication after the expiration date. To get rid of medications that are no longer needed or have expired: Take the medication to a medication take-back program. Check with your pharmacy or law enforcement to find a location. If you cannot return the medication, ask your pharmacist or care team how to get rid of the medication safely. NOTE: This sheet is a summary. It may not cover all possible information. If you have questions about this medicine, talk to your doctor, pharmacist, or health care provider.  2024 Elsevier/Gold Standard (2021-06-04 00:00:00)  Standing Labs We placed an order today for your standing lab work.   Please have your standing labs drawn in 1 month after starting Orencia and then every 3 months  Please have your labs drawn 2 weeks prior to your appointment so that the  provider can discuss your lab results at your appointment, if possible.  Please note that you may see your imaging and lab results in MyChart before we have reviewed them. We will contact you once all results are reviewed. Please allow our office up to 72 hours to thoroughly review all of the results before contacting the office for clarification of your results.  WALK-IN LAB HOURS  Monday through Thursday from 8:00 am - 4:30 pm and Friday from 8:00 am-12:00 pm.  Patients with office visits requiring labs will be seen before walk-in labs.  You may encounter longer than normal wait times. Please allow additional time. Wait times may be shorter on  Monday and Thursday afternoons.  We do not book appointments for walk-in labs. We appreciate your patience and understanding with our staff.   Labs are drawn by [facility]. Please bring your co-pay at the time of your lab draw.  You may receive a bill from [facility] for your lab work.  Please note if you are on Hydroxychloroquine and and an order has been placed for a Hydroxychloroquine level,  you will need to have it drawn 4 hours or more after your last dose.  If you wish to have your labs drawn at another location,  please call the office 24 hours in advance so we can fax the orders.  The office is located at [address], [address], [place], [place] [address]   If you have any questions regarding directions or hours of operation,  please call [phone].   As a reminder, please drink plenty of water prior to coming for your lab work. Thanks!   Vaccines You are taking a medication(s) that can suppress your immune system.  The following immunizations are recommended: Flu annually Covid-19  RSV Td/Tdap (tetanus, diphtheria, pertussis) every 10 years Pneumonia (Prevnar 15 then Pneumovax 23 at least 1 year apart.  Alternatively, can take Prevnar 20 without needing additional dose) Shingrix : 2 doses from 4 weeks to 6 months apart  Please check  with your PCP to make sure you are up to date.   If you have signs or symptoms of an infection or start antibiotics: First, call your PCP for workup of your infection. Hold your medication through the infection, until you complete your antibiotics, and until symptoms resolve if you take the following: Injectable medication (Actemra, Benlysta, Cimzia, Cosentyx, Enbrel, Humira, Kevzara, Orencia, Remicade, Simponi, Stelara, Taltz, Tremfya) Methotrexate  Leflunomide  (Arava ) Mycophenolate (Cellcept) Xeljanz, Olumiant, or Rinvoq

## 2023-12-13 NOTE — Progress Notes (Signed)
 Pharmacy Note  Subjective: Patient presents today to [facility] for follow up office visit.   Patient seen by the pharmacist for counseling on subcutaneous Orencia rheumatoid arthritis (RA).  Prior therapy includes: Arava  20 mg daily.  Objective: CBC    Component Value Date/Time   WBC 5.2 11/10/2023 1041   RBC 4.52 11/10/2023 1041   HGB 13.8 11/10/2023 1041   HGB 14.0 03/15/2023 1034   HGB 14.0 06/25/2022 1125   HCT 42.9 11/10/2023 1041   HCT 42.2 06/25/2022 1125   PLT 281 11/10/2023 1041   PLT 317 03/15/2023 1034   PLT 285 06/25/2022 1125   MCV 94.9 11/10/2023 1041   MCV 92 06/25/2022 1125   MCH 30.5 11/10/2023 1041   MCHC 32.2 11/10/2023 1041   RDW 13.0 11/10/2023 1041   RDW 12.5 06/25/2022 1125   LYMPHSABS 2.3 03/15/2023 1034   LYMPHSABS 2.1 06/25/2022 1125   MONOABS 0.6 03/15/2023 1034   EOSABS 182 11/10/2023 1041   EOSABS 0.2 06/25/2022 1125   BASOSABS 88 11/10/2023 1041   BASOSABS 0.1 06/25/2022 1125    CMP     Component Value Date/Time   NA 142 11/10/2023 1041   NA 143 06/25/2022 1125   K 4.5 11/10/2023 1041   CL 106 11/10/2023 1041   CO2 27 11/10/2023 1041   GLUCOSE 110 (H) 11/10/2023 1041   GLUCOSE 87 01/20/2006 1243   BUN 15 11/10/2023 1041   BUN 13 06/25/2022 1125   CREATININE 0.52 (L) 11/10/2023 1041   CALCIUM  10.2 11/10/2023 1041   PROT 7.2 11/10/2023 1041   PROT 6.8 06/25/2022 1125   ALBUMIN 4.1 03/18/2023 0950   ALBUMIN 4.4 06/25/2022 1125   AST 23 11/10/2023 1041   AST 17 03/15/2023 1034   ALT 17 11/10/2023 1041   ALT 10 03/15/2023 1034   ALKPHOS 46 03/18/2023 0950   BILITOT 0.4 11/10/2023 1041   BILITOT 0.3 03/15/2023 1034   GFRNONAA >60 09/08/2023 0918   GFRNONAA >60 03/15/2023 1034   GFRNONAA 93 04/22/2020 1159   GFRAA 108 04/22/2020 1159    Baseline Immunosuppressant Therapy Labs TB GOLD    Latest Ref Rng & Units 04/05/2019   10:28 AM  Quantiferon TB Gold  Quantiferon TB Gold Plus NEGATIVE NEGATIVE    Hepatitis  Panel    Latest Ref Rng & Units 04/05/2019   10:28 AM  Hepatitis  Hep B IgM NON-REACTI NON-REACTIVE   Hep C Ab NON-REACTI NON-REACTIVE    HIV Lab Results  Component Value Date   HIV Non Reactive 11/02/2019   HIV Non Reactive 02/14/2018   Immunoglobulins    Latest Ref Rng & Units 04/05/2019   10:28 AM  Immunoglobulin Electrophoresis  IgA  70 - 320 mg/dL 857   IgG 399 - 8,459 mg/dL 067   IgM 50 - 699 mg/dL 80    SPEP    Latest Ref Rng & Units 11/10/2023   10:41 AM  Serum Protein Electrophoresis  Total Protein 6.1 - 8.1 g/dL 7.2    H3EI No results found for: G6PDH TPMT Lab Results  Component Value Date   TPMT 16 10/04/2019     Chest x-ray: 07/29/22: IMPRESSION: 1. Minimal subpleural reticular densities and traction bronchiolectasis in the lateral aspects of the mid and lower lung zones, grossly stable from 09/26/2019.  Does patient have a diagnosis of COPD? No  Assessment/Plan:  Counseled patient that Hargis is a selective T-cell costimulation blocker.  Counseled patient on purpose, proper use, and adverse effects  of Orencia. The most common adverse effects are increased risk of infections, headache, and injection site reactions.  There is the possibility of an increased risk of malignancy but it is not well understood if this increased risk is due to the medication or the disease state. Counseled patient that Orencia should be held prior to scheduled surgery.  Counseled patient to avoid live vaccines while on Orencia.  Recommend annual influenza, Pneumovax 23, Prevnar 13, and Shingrix  as indicated.   Reviewed the importance of regular labs while on Orencia therapy. Patient will be due for labs 1 month after starting therapy. Standing orders placed. Provided patient with medication education material and answered all questions.  Patient consented to [facility].  Will upload consent into patient's chart.  Will apply for Orencia through patient's insurance.  Reviewed storage  information for Orencia.    Patient dose will be Orencia 125 mg subcut every 7 days.  Prescription pending lab results and/or insurance approval.  Prednisone  taper sent in for patient.  Jenkins Graces, PharmD PGY1 Pharmacy Resident [phone]
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
## 2023-12-14 ENCOUNTER — Other Ambulatory Visit (HOSPITAL_COMMUNITY): Payer: Self-pay

## 2023-12-14 ENCOUNTER — Telehealth: Payer: Self-pay

## 2023-12-14 NOTE — Telephone Encounter (Signed)
 Au, Halie M, RPH  P Rx Rheum/Pulm Orencia new start RA. Pt has ILD, unable to use TNFi, methotrexate  - inadequate response to Arava . -----------------------------------------------------------------  Submitted a Prior Authorization request to [facility] [employer] for ORENCIA SQ via CoverMyMeds. Authorization has been APPROVED from 12/14/2023 to 06/12/2024. Approval letter has not yet been received, but will be sent to scan center once obtained.   Test Claim revealed that a 84 day supply has a copay of $0.00.   Patient can fill through [facility]: [phone]     CMM KEY: [id number] Authorization#: [id number]

## 2023-12-14 NOTE — Telephone Encounter (Signed)
 Patient will not need new start visit. Orencia rx can be sent to [facility] once TB gold results. Please send 3 month supply of Orencia  Mycah Mcdougall, PharmD, MPH, BCPS, CPP Clinical Pharmacist [facility])

## 2023-12-15 LAB — QUANTIFERON-TB GOLD PLUS
Mitogen-NIL: 8.34 [IU]/mL
NIL: 0.01 [IU]/mL
QuantiFERON-TB Gold Plus: NEGATIVE
TB1-NIL: 0 [IU]/mL
TB2-NIL: 0 [IU]/mL

## 2023-12-15 LAB — SEDIMENTATION RATE: Sed Rate: 19 mm/h (ref 0–30)

## 2023-12-16 ENCOUNTER — Ambulatory Visit: Payer: Self-pay | Admitting: Rheumatology

## 2023-12-16 NOTE — Progress Notes (Signed)
 Sed rate normal, TB Gold negative.  We plan to start her on Orencia once approved.

## 2023-12-19 MED ORDER — ORENCIA CLICKJECT 125 MG/ML ~~LOC~~ SOAJ
125.0000 mg | SUBCUTANEOUS | 0 refills | Status: AC
  Filled 2023-12-21: qty 12, 84d supply, fill #0

## 2023-12-21 ENCOUNTER — Other Ambulatory Visit: Payer: Self-pay

## 2023-12-21 ENCOUNTER — Other Ambulatory Visit (HOSPITAL_COMMUNITY): Payer: Self-pay

## 2023-12-21 NOTE — Progress Notes (Signed)
 Patient counseled on Orencia for RA at [facility] on 12/13/2203 by [facility], PharmD. She is comfortable with self-administering (is on insulin )  Start Orencia 125mg  subcut every 7 days  Sherry Pennant, PharmD, MPH, BCPS, CPP Clinical Pharmacist [facility])

## 2023-12-21 NOTE — Progress Notes (Signed)
 Specialty Pharmacy Initial Fill Coordination Note  Cheryl Knight is a 68 y.o. female contacted today regarding initial fill of specialty medication(s) Abatacept (Orencia ClickJect)   Patient requested Delivery   Delivery date: 12/23/23   Verified address: [address]  [place] [address]   Medication will be filled on: 12/22/23   Patient is aware of $0 copayment. Fill as 84-day supply.

## 2023-12-27 ENCOUNTER — Ambulatory Visit: Attending: Physician Assistant | Admitting: Physician Assistant

## 2023-12-27 DIAGNOSIS — M170 Bilateral primary osteoarthritis of knee: Secondary | ICD-10-CM | POA: Diagnosis not present

## 2023-12-27 MED ORDER — LIDOCAINE HCL 1 % IJ SOLN
1.5000 mL | INTRAMUSCULAR | Status: AC | PRN
  Administered 2023-12-27: 1.5 mL

## 2023-12-27 MED ORDER — HYALURONAN 30 MG/2ML IX SOSY
30.0000 mg | PREFILLED_SYRINGE | INTRA_ARTICULAR | Status: AC | PRN
  Administered 2023-12-27: 30 mg via INTRA_ARTICULAR

## 2023-12-27 NOTE — Progress Notes (Signed)
   Procedure Note  Patient: KERRIGAN GOMBOS             Date of Birth: 1955/07/09           MRN: [id number]             Visit Date: 12/27/2023  Procedures: Visit Diagnoses:  1. Primary osteoarthritis of both knees     #1 Orthovisc Bilateral Knees B/B  Large Joint Inj: bilateral knee on 12/27/2023 2:02 PM Indications: pain Details: 25 G 1.5 in needle, medial approach  Arthrogram: No  Medications (Right): 1.5 mL lidocaine  1 %; 30 mg Hyaluronan 30 MG/2ML Aspirate (Right): 0 mL Medications (Left): 1.5 mL lidocaine  1 %; 30 mg Hyaluronan 30 MG/2ML Aspirate (Left): 0 mL Outcome: tolerated well, no immediate complications Procedure, treatment alternatives, risks and benefits explained, specific risks discussed. Consent was given by the patient.    Patient tolerated the procedures well.  Aftercare was discussed.  Waddell Craze, PA-C

## 2023-12-29 ENCOUNTER — Encounter

## 2024-01-02 ENCOUNTER — Other Ambulatory Visit: Payer: Self-pay | Admitting: *Deleted

## 2024-01-02 MED ORDER — PREDNISONE 5 MG PO TABS: ORAL_TABLET | ORAL | 0 refills | Status: DC

## 2024-01-02 NOTE — Telephone Encounter (Signed)
 Patient contacted the office and states she is supposed to be having knee injections tomorrow, 01/03/2024. Patient states she is having pain and swelling in her bilateral hands. Patient states the right is worse than the left. Patient states she is unable to use her right hand. Patient states she has no grip strength. Patient states she has taken her second dose of Orencia  on 12/30/2023. Patient states she has had no missed dose of Arava . Patient states her diabetes is under control and she has tolerated Prednisone  before. Patient is requesting a prednisone  taper.

## 2024-01-02 NOTE — Telephone Encounter (Signed)
 Attempted to contact the patient and left message for patient to call the office.

## 2024-01-02 NOTE — Addendum Note (Signed)
 Addended by: CENA ALFONSO CROME on: 01/02/2024 01:24 PM   Modules accepted: Orders

## 2024-01-02 NOTE — Telephone Encounter (Signed)
 Okay to send prednisone  taper starting at 15 mg p.o. daily and taper by 5 mg every 4 days.  Patient should monitor blood glucose level closely.  Prednisone  can cause weight gain, worsening of diabetes, hypertension, hyperlipidemia, cataracts and osteoporosis.

## 2024-01-03 ENCOUNTER — Ambulatory Visit: Attending: Physician Assistant | Admitting: Physician Assistant

## 2024-01-03 DIAGNOSIS — M170 Bilateral primary osteoarthritis of knee: Secondary | ICD-10-CM

## 2024-01-03 MED ORDER — LIDOCAINE HCL 1 % IJ SOLN
1.5000 mL | INTRAMUSCULAR | Status: AC | PRN
  Administered 2024-01-03: 10:00:00 1.5 mL

## 2024-01-03 MED ORDER — HYALURONAN 30 MG/2ML IX SOSY
30.0000 mg | PREFILLED_SYRINGE | INTRA_ARTICULAR | Status: AC | PRN
  Administered 2024-01-03: 10:00:00 30 mg via INTRA_ARTICULAR

## 2024-01-03 NOTE — Progress Notes (Signed)
° °  Procedure Note  Patient: Cheryl Knight             Date of Birth: 1955/10/28           MRN: [id number]             Visit Date: 01/03/2024  Procedures: Visit Diagnoses:  1. Primary osteoarthritis of both knees     #2 Orthovisc Bilateral Knees B/B  Large Joint Inj: bilateral knee on 01/03/2024 10:26 AM Indications: pain Details: 25 G 1.5 in needle, medial approach  Arthrogram: No  Medications (Right): 1.5 mL lidocaine  1 %; 30 mg Hyaluronan 30 MG/2ML Aspirate (Right): 0 mL Medications (Left): 1.5 mL lidocaine  1 %; 30 mg Hyaluronan 30 MG/2ML Aspirate (Left): 0 mL Outcome: tolerated well, no immediate complications Procedure, treatment alternatives, risks and benefits explained, specific risks discussed. Consent was given by the patient.       Patient tolerated the procedures well.  Procedure notes were completed above.  Aftercare was discussed. Waddell Craze, PA-C

## 2024-01-03 NOTE — Telephone Encounter (Signed)
 Patient advised we will send prednisone  taper starting at 15 mg p.o. daily and taper by 5 mg every 4 days. Patient should monitor blood glucose level closely. Prednisone  can cause weight gain, worsening of diabetes, hypertension, hyperlipidemia, cataracts and osteoporosis. Patient expressed understanding.

## 2024-01-10 ENCOUNTER — Ambulatory Visit: Attending: Physician Assistant | Admitting: Physician Assistant

## 2024-01-10 DIAGNOSIS — M170 Bilateral primary osteoarthritis of knee: Secondary | ICD-10-CM | POA: Diagnosis not present

## 2024-01-10 MED ORDER — HYALURONAN 30 MG/2ML IX SOSY
30.0000 mg | PREFILLED_SYRINGE | INTRA_ARTICULAR | Status: AC | PRN
  Administered 2024-01-10: 30 mg via INTRA_ARTICULAR

## 2024-01-10 MED ORDER — LIDOCAINE HCL 1 % IJ SOLN
1.5000 mL | INTRAMUSCULAR | Status: AC | PRN
  Administered 2024-01-10: 1.5 mL

## 2024-01-10 NOTE — Progress Notes (Signed)
" ° °  Procedure Note  Patient: Cheryl Knight             Date of Birth: [birth year]           MRN: [id number]             Visit Date: 01/10/2024  Procedures: Visit Diagnoses:  1. Primary osteoarthritis of both knees    #3 Orthovisc Bilateral Knees B/B  Large Joint Inj: bilateral knee on 01/10/2024 2:30 PM Indications: pain Details: 25 G 1.5 in needle, medial approach  Arthrogram: No  Medications (Right): 1.5 mL lidocaine  1 %; 30 mg Hyaluronan 30 MG/2ML Aspirate (Right): 0 mL Medications (Left): 1.5 mL lidocaine  1 %; 30 mg Hyaluronan 30 MG/2ML Aspirate (Left): 0 mL Outcome: tolerated well, no immediate complications Procedure, treatment alternatives, risks and benefits explained, specific risks discussed. Consent was given by the patient.       Patient tolerated the procedures well.  Procedure notes were completed above.  Aftercare was discussed. "

## 2024-01-21 ENCOUNTER — Other Ambulatory Visit: Payer: Self-pay | Admitting: Physician Assistant

## 2024-01-21 DIAGNOSIS — M0579 Rheumatoid arthritis with rheumatoid factor of multiple sites without organ or systems involvement: Secondary | ICD-10-CM

## 2024-01-23 NOTE — Telephone Encounter (Signed)
 Last Fill: 10/25/2023  Labs: 11/10/2023 CBC WNL  CMP stable  Next Visit: 02/29/2024  Last Visit: 12/13/2023  DX: Rheumatoid arthritis involving multiple sites with positive rheumatoid factor   Current Dose per office note 12/13/2023: Arava  20 mg 1 tablet by mouth daily.   Okay to refill Arava  ?

## 2024-01-24 ENCOUNTER — Encounter: Payer: Self-pay | Admitting: Pulmonary Disease

## 2024-01-24 ENCOUNTER — Ambulatory Visit: Admitting: Pulmonary Disease

## 2024-01-24 VITALS — BP 96/76 | HR 96 | Temp 97.6°F | Ht 66.5 in | Wt 200.0 lb

## 2024-01-24 DIAGNOSIS — J849 Interstitial pulmonary disease, unspecified: Secondary | ICD-10-CM | POA: Diagnosis not present

## 2024-01-24 DIAGNOSIS — C50911 Malignant neoplasm of unspecified site of right female breast: Secondary | ICD-10-CM

## 2024-01-24 DIAGNOSIS — M0579 Rheumatoid arthritis with rheumatoid factor of multiple sites without organ or systems involvement: Secondary | ICD-10-CM

## 2024-01-24 DIAGNOSIS — Z87891 Personal history of nicotine dependence: Secondary | ICD-10-CM

## 2024-01-24 NOTE — Patient Instructions (Signed)
 VISIT SUMMARY:  Today, you came in for a follow-up appointment to discuss your mild interstitial lung disease likely related to rheumatoid arthritis, as well as your recent rheumatoid arthritis flares and overall health. You reported no breathing problems and shared your experiences with joint pain and recent treatments. We also discussed your history of breast cancer and your active lifestyle.  YOUR PLAN:  -MILD INTERSTITIAL LUNG DISEASE LIKELY NSIP RELATED TO RHEUMATOID ARTHRITIS: Mild interstitial lung disease is a condition where the lung tissue becomes inflamed and scarred, which can be related to rheumatoid arthritis. You are currently asymptomatic with no breathing problems. We have ordered a high-resolution chest CT and a pulmonary function test to monitor your lung health. Please continue with your annual follow-ups unless new symptoms arise.  -RHEUMATOID ARTHRITIS: Rheumatoid arthritis is an autoimmune disease that causes joint inflammation and pain. You have experienced several flares, but recent treatments with Orencia  and Arava  have helped prevent further flares. Despite this, you still have persistent joint aching. We discussed your frustration with your current rheumatologist and your consideration of changing providers. It's important to find a rheumatologist who meets your needs and communicates effectively with you.  -BREAST CANCER HISTORY: You have a history of breast cancer and underwent surgery, with a revision surgery in August that has healed well. It's important to keep up with your follow-up appointments with your breast cancer provider to monitor your health.  INSTRUCTIONS:  Please complete the high-resolution chest CT and pulmonary function test as ordered. Continue with your annual follow-ups for your lung condition unless new symptoms arise. Consider finding a new rheumatologist if you feel your current provider is not meeting your needs. Make sure to reschedule your missed  appointment with your breast cancer provider to ensure ongoing monitoring of your health.

## 2024-01-24 NOTE — Progress Notes (Unsigned)
 "  Subjective:    Patient ID: Cheryl Knight, female    DOB: November 22, 1955, 69 y.o.   MRN: [id number]  Patient Care Team: Bennett Reuben POUR, MD as PCP - General (Family Medicine) Dewey Rush, MD as Consulting Physician (Radiation Oncology) Lanny Callander, MD as Consulting Physician (Hematology) Belinda Cough, MD as Consulting Physician (General Surgery) Tyree Nanetta SAILOR, RN as Oncology Nurse Navigator Burton, Lacie K, NP as Nurse Practitioner (Nurse Practitioner)  Chief Complaint  Patient presents with   Interstitial Lung Disease    No breathing problems.     BACKGROUND/INTERVAL:  HPI   Review of Systems A 10 point review of systems was performed and it is as noted above otherwise negative.   Patient Active Problem List   Diagnosis Date Noted   Chronic migraine w/o aura w/o status migrainosus, not intractable 12/20/2022   Seroma of breast 11/09/2022   Diarrhea 11/09/2022   Need for influenza vaccination 11/09/2022   Type 2 diabetes with complication (HCC) 11/09/2022   Invasive ductal carcinoma of breast, female, right (HCC) 11/03/2022   Ductal carcinoma in situ (DCIS) of right breast 08/26/2022   ILD (interstitial lung disease) (HCC) 07/05/2022   Fatty liver 06/24/2020   Pulmonary fibrosis (HCC) 10/01/2019   Pulmonary nodules 10/01/2019   Bronchiectasis without complication (HCC) 10/01/2019   Aortic atherosclerosis 10/01/2019   Hypertension associated with diabetes (HCC) 10/01/2019   Paresthesia of upper extremity 10/01/2019   Hiatal hernia 08/16/2019   Dry eye syndrome 08/05/2019   Hypertensive retinopathy 08/05/2019   Arteriosclerosis 08/05/2019   Pterygium 08/05/2019   Ocular rosacea 08/05/2019   Malignant neoplasm of lower-outer quadrant of right breast of female, estrogen receptor positive (HCC) 03/19/2019   Allergic rhinitis 02/08/2019   Osteoarthritis    Obesity (BMI 30-39.9) 12/30/2015   Basal cell carcinoma of chest wall 10/05/2011   Hyperlipidemia 02/27/2008    Hypothyroidism 02/21/2007   Migraine 02/21/2007   VAGINITIS, ATROPHIC 02/21/2007   Psoriasis 02/21/2007   Sicca syndrome, unspecified 02/21/2007   GERD 06/29/2006   Rheumatoid arthritis (HCC) 06/29/2006    Social History   Tobacco Use   Smoking status: Former    Current packs/day: 0.00    Average packs/day: 0.3 packs/day for 7.0 years (1.8 ttl pk-yrs)    Types: Cigarettes    Start date: 53    Quit date: 2000    Years since quitting: 26.0    Passive exposure: Past   Smokeless tobacco: Never  Substance Use Topics   Alcohol  use: Yes    Comment: rare- with birthdays and beach trips    Allergies[1]  Active Medications[2]  Immunization History  Administered Date(s) Administered   Fluad Quad(high Dose 65+) 03/11/2020, 10/29/2021   Fluad Trivalent(High Dose 65+) 11/08/2022   H1N1 02/27/2008   INFLUENZA, HIGH DOSE SEASONAL PF 10/14/2023   Influenza-Unspecified 11/18/2017   PFIZER(Purple Top)SARS-COV-2 Vaccination 03/02/2019, 03/29/2019, 01/01/2020   PNEUMOCOCCAL CONJUGATE-20 07/22/2021   Pfizer Covid-19 Vaccine Bivalent Booster 39yrs & up 02/02/2021   Pneumococcal Conjugate-13 10/03/2019   Td 01/19/2004   Tdap 12/17/2014        Objective:     Vitals:   01/24/24 1422  BP: 96/76  Pulse: 96  Temp: 97.6 F (36.4 C)  Height: 5' 6.5 (1.689 m)  Weight: 200 lb (90.7 kg)  SpO2: 95%  TempSrc: Temporal  BMI (Calculated): 31.8     SpO2: 95 %  GENERAL: HEAD: Normocephalic, atraumatic.  EYES: Pupils equal, round, reactive to light.  No scleral icterus.  MOUTH:  NECK: Supple. No thyromegaly. Trachea midline. No JVD.  No adenopathy. PULMONARY: Good air entry bilaterally.  No adventitious sounds. CARDIOVASCULAR: S1 and S2. Regular rate and rhythm.  ABDOMEN: MUSCULOSKELETAL: No joint deformity, no clubbing, no edema.  NEUROLOGIC:  SKIN: Intact,warm,dry. PSYCH:        Assessment & Plan:     ICD-10-CM   1. ILD (interstitial lung disease) (HCC)  J84.9 CT  CHEST HIGH RESOLUTION    Pulmonary function test    2. Rheumatoid arthritis involving multiple sites with positive rheumatoid factor (HCC)  M05.79     3. Malignant neoplasm of right breast in female, estrogen receptor negative, unspecified site of breast (HCC)  C50.911    Z17.1       Orders Placed This Encounter  Procedures   CT CHEST HIGH RESOLUTION    Standing Status:   Future    Expected Date:   02/07/2024    Expiration Date:   01/23/2025    Preferred imaging location?:   [facility]   Pulmonary function test    Standing Status:   Future    Expected Date:   02/07/2024    Expiration Date:   01/23/2025    Where should this test be performed?:   Outpatient Pulmonary    What type of PFT is being ordered?:   Full PFT    No orders of the defined types were placed in this encounter.     Advised if symptoms do not improve or worsen, to please contact office for sooner follow up or seek emergency care.    I spent xxx minutes of dedicated to the care of this patient on the date of this encounter to include pre-visit review of records, face-to-face time with the patient discussing conditions above, post visit ordering of testing, clinical documentation with the electronic health record, making appropriate referrals as documented, and communicating necessary findings to members of the patients care team.     C. Leita Sanders, MD Advanced Bronchoscopy PCCM  [facility]    *This note was generated using voice recognition software/Dragon and/or AI transcription program.  Despite best efforts to proofread, errors can occur which can change the meaning. Any transcriptional errors that result from this process are unintentional and may not be fully corrected at the time of dictation.    [1]  Allergies Allergen Reactions   Pentazocine Other (See Comments)    pentazocine   Pentazocine Lactate Other (See Comments)    Hallucinations   Tape     If left on long  enough causes skin irritation    Lisinopril  Cough   Meperidine Nausea And Vomiting, Nausea Only and Other (See Comments)    meperidine   Nystatin  Rash and Dermatitis    Powder or cream  nystatin    Ozempic  (0.25 Or 0.5 Mg-Dose) [Semaglutide (0.25 Or 0.5mg -Dos)] Diarrhea  [2]  Current Meds  Medication Sig   Abatacept  (ORENCIA  CLICKJECT) 125 MG/ML SOAJ Inject 125 mg into the skin once a week.   amLODipine  (NORVASC ) 2.5 MG tablet TAKE 1 TABLET BY MOUTH DAILY   aspirin-acetaminophen -caffeine (EXCEDRIN MIGRAINE) 250-250-65 MG tablet Take 2 tablets by mouth every 8 (eight) hours as needed for headache.   betamethasone  dipropionate 0.05 % cream Apply topically 2 (two) times daily. (Patient taking differently: Apply topically as needed.)   Blood Glucose Monitoring Suppl DEVI 1 each by Does not apply route 3 (three) times a week.   Calcium  Carb-Cholecalciferol (CALCIUM  600 + D PO) Take 1 tablet by mouth 2 (two) times  daily.   Calcium  Carbonate Antacid 1177 MG CHEW Chew 1,177 mg by mouth at bedtime.   cyanocobalamin  (VITAMIN B12) 1000 MCG tablet Take 1,000 mcg by mouth every other day.   Dextran 70-Hypromellose ([employer] NATURAL TEARS PF OP) Place 1 drop into both eyes See admin instructions. Instill 1 drop into both eyes up to 6 times daily as needed for dry eyes   esomeprazole  (NEXIUM ) 40 MG capsule TAKE 1 CAPSULE(40 MG) BY MOUTH DAILY   famotidine  (PEPCID ) 20 MG tablet TAKE 1 TABLET BY MOUTH 2 TIMES A DAY   glucosamine-chondroitin 500-400 MG tablet Take 1 tablet by mouth 2 (two) times daily.   Glucose Blood (BLOOD GLUCOSE TEST STRIPS) STRP 1 each by In Vitro route 3 (three) times a week.   Lancets (ONETOUCH DELICA PLUS LANCET33G) MISC SMARTSIG:Via Meter 3 Times a Week   Lancets Misc. MISC 1 each by Does not apply route 3 (three) times a week.   leflunomide  (ARAVA ) 20 MG tablet TAKE 1 TABLET BY MOUTH DAILY   levothyroxine  (SYNTHROID ) 100 MCG tablet TAKE 1 TABLET BY MOUTH DAILY 30 MINUTES BEFORE FOOD    loratadine  (CLARITIN ) 10 MG tablet Take 10 mg by mouth daily.   Melatonin 10 MG CAPS Take 10 mg by mouth at bedtime.   Multiple Vitamin (MULTIVITAMIN WITH MINERALS) TABS tablet Take 1 tablet by mouth daily.   olmesartan -hydrochlorothiazide  (BENICAR  HCT) 20-12.5 MG tablet TAKE 1 TABLET BY MOUTH EVERY MORNING   Omega-3 Fatty Acids (FISH OIL) 1000 MG CAPS Take 1,000 mg by mouth daily.   OVER THE COUNTER MEDICATION Blink Eye Drops (Patient taking differently: Blink Eye Pill)   oxyCODONE  (OXY IR/ROXICODONE ) 5 MG immediate release tablet Take 1 tablet (5 mg total) by mouth every 6 (six) hours as needed for severe pain (pain score 7-10).   RESTASIS  MULTIDOSE 0.05 % ophthalmic emulsion SMARTSIG:In Eye(s)   rizatriptan  (MAXALT ) 10 MG tablet Take 1 tablet (10 mg total) by mouth as needed for migraine. May repeat in 2 hours if needed   simvastatin  (ZOCOR ) 40 MG tablet Take 1 tablet (40 mg total) by mouth at bedtime.   tamoxifen  (NOLVADEX ) 20 MG tablet TAKE 1 TABLET(20 MG) BY MOUTH DAILY   tiZANidine  (ZANAFLEX ) 4 MG tablet Take 1 tablet (4 mg total) by mouth at bedtime as needed for muscle spasms.   topiramate  (TOPAMAX ) 100 MG tablet TAKE 1 TABLET(100 MG) BY MOUTH DAILY   White Petrolatum-Mineral Oil (SYSTANE NIGHTTIME) OINT Apply to eye.   "

## 2024-01-25 ENCOUNTER — Encounter: Payer: Self-pay | Admitting: Pulmonary Disease

## 2024-02-03 ENCOUNTER — Telehealth: Payer: Self-pay

## 2024-02-03 DIAGNOSIS — C50511 Malignant neoplasm of lower-outer quadrant of right female breast: Secondary | ICD-10-CM

## 2024-02-03 DIAGNOSIS — M0579 Rheumatoid arthritis with rheumatoid factor of multiple sites without organ or systems involvement: Secondary | ICD-10-CM

## 2024-02-03 NOTE — Telephone Encounter (Signed)
 Copied from CRM [id number]. Topic: Referral - Question >> Feb 03, 2024 11:46 AM Rea ORN wrote: Reason for CRM: Pt calling to see if she can have 2 new referrals sent in. Pt stated she no longer works in [place] and wants to move her all of her doctors to [place]. There are no new issues with pt. Pt would like to know if these can be written out without her seeing PCP:  Rhematology- Dr. Lady Blanch at [facility]- Any provider at [facility] at [facility]   Please call back to advise,  [phone]

## 2024-02-06 ENCOUNTER — Ambulatory Visit

## 2024-02-06 ENCOUNTER — Ambulatory Visit
Admission: RE | Admit: 2024-02-06 | Discharge: 2024-02-06 | Disposition: A | Discharge status: Home / Self Care | Source: Ambulatory Visit | Attending: Pulmonary Disease | Admitting: Pulmonary Disease

## 2024-02-06 DIAGNOSIS — J849 Interstitial pulmonary disease, unspecified: Secondary | ICD-10-CM

## 2024-02-06 LAB — PULMONARY FUNCTION TEST
DL/VA % pred: 108 %
DL/VA: 4.44 ml/min/mmHg/L
DLCO unc % pred: 115 %
DLCO unc: 24.16 ml/min/mmHg
FEF 25-75 Post: 3.47 L/s
FEF 25-75 Pre: 3.46 L/s
FEF2575-%Change-Post: 0 %
FEF2575-%Pred-Post: 169 %
FEF2575-%Pred-Pre: 168 %
FEV1-%Change-Post: 1 %
FEV1-%Pred-Post: 125 %
FEV1-%Pred-Pre: 122 %
FEV1-Post: 3.14 L
FEV1-Pre: 3.08 L
FEV1FVC-%Change-Post: 3 %
FEV1FVC-%Pred-Pre: 107 %
FEV6-%Change-Post: 0 %
FEV6-%Pred-Post: 117 %
FEV6-%Pred-Pre: 117 %
FEV6-Post: 3.71 L
FEV6-Pre: 3.74 L
FEV6FVC-%Change-Post: 0 %
FEV6FVC-%Pred-Post: 104 %
FEV6FVC-%Pred-Pre: 103 %
FVC-%Change-Post: -1 %
FVC-%Pred-Post: 112 %
FVC-%Pred-Pre: 113 %
FVC-Post: 3.71 L
FVC-Pre: 3.77 L
Post FEV1/FVC ratio: 85 %
Post FEV6/FVC ratio: 100 %
Pre FEV1/FVC ratio: 82 %
Pre FEV6/FVC Ratio: 99 %
RV % pred: 99 %
RV: 2.29 L
TLC % pred: 108 %
TLC: 5.89 L

## 2024-02-06 NOTE — Patient Instructions (Signed)
 Full PFT completed today ? ?

## 2024-02-06 NOTE — Progress Notes (Signed)
 Full PFT completed today ? ?

## 2024-02-07 ENCOUNTER — Ambulatory Visit: Payer: Self-pay | Admitting: Pulmonary Disease

## 2024-02-08 ENCOUNTER — Other Ambulatory Visit: Payer: Self-pay

## 2024-02-08 DIAGNOSIS — E039 Hypothyroidism, unspecified: Secondary | ICD-10-CM

## 2024-02-15 NOTE — Progress Notes (Unsigned)
 "  Office Visit Note  Patient: Cheryl Knight             Date of Birth: 01-27-1955           MRN: [id number]             PCP: Bennett Reuben POUR, MD Referring: Bennett Reuben POUR, MD Visit Date: 02/29/2024 Occupation: Data Unavailable  Subjective:  No chief complaint on file.   History of Present Illness: Cheryl Knight is a 69 y.o. female ***     Activities of Daily Living:  Patient reports morning stiffness for *** {minute/hour:19697}.   Patient {ACTIONS;DENIES/REPORTS:21021675::Denies} nocturnal pain.  Difficulty dressing/grooming: {ACTIONS;DENIES/REPORTS:21021675::Denies} Difficulty climbing stairs: {ACTIONS;DENIES/REPORTS:21021675::Denies} Difficulty getting out of chair: {ACTIONS;DENIES/REPORTS:21021675::Denies} Difficulty using hands for taps, buttons, cutlery, and/or writing: {ACTIONS;DENIES/REPORTS:21021675::Denies}  No Rheumatology ROS completed.   PMFS History:  Patient Active Problem List   Diagnosis Date Noted   Chronic migraine w/o aura w/o status migrainosus, not intractable 12/20/2022   Seroma of breast 11/09/2022   Diarrhea 11/09/2022   Need for influenza vaccination 11/09/2022   Type 2 diabetes with complication (HCC) 11/09/2022   Invasive ductal carcinoma of breast, female, right (HCC) 11/03/2022   Ductal carcinoma in situ (DCIS) of right breast 08/26/2022   ILD (interstitial lung disease) (HCC) 07/05/2022   Fatty liver 06/24/2020   Pulmonary fibrosis (HCC) 10/01/2019   Pulmonary nodules 10/01/2019   Bronchiectasis without complication (HCC) 10/01/2019   Aortic atherosclerosis 10/01/2019   Hypertension associated with diabetes (HCC) 10/01/2019   Paresthesia of upper extremity 10/01/2019   Hiatal hernia 08/16/2019   Dry eye syndrome 08/05/2019   Hypertensive retinopathy 08/05/2019   Arteriosclerosis 08/05/2019   Pterygium 08/05/2019   Ocular rosacea 08/05/2019   Malignant neoplasm of lower-outer quadrant of right breast of female,  estrogen receptor positive (HCC) 03/19/2019   Allergic rhinitis 02/08/2019   Osteoarthritis    Obesity (BMI 30-39.9) 12/30/2015   Basal cell carcinoma of chest wall 10/05/2011   Hyperlipidemia 02/27/2008   Hypothyroidism 02/21/2007   Migraine 02/21/2007   VAGINITIS, ATROPHIC 02/21/2007   Psoriasis 02/21/2007   Sicca syndrome, unspecified 02/21/2007   GERD 06/29/2006   Rheumatoid arthritis (HCC) 06/29/2006    Past Medical History:  Diagnosis Date   Cancer (HCC) 02/2019   right breast IDC   Cataracts, bilateral    tbd surgery 2021   Complication of anesthesia    Cough    Diabetes mellitus without complication (HCC)    Difficult intubation    TRACH AT 19   Dry eye syndrome    Dyspnea    W/ COUGHING- was due to lisinopril  and septic gallbladder   Family history of adverse reaction to anesthesia    sister coded with propofol  use   Family history of breast cancer    Family history of lung cancer    Family history of prostate cancer    Family history of skin cancer    GERD (gastroesophageal reflux disease)    Glaucoma    stable glaucoma suspect 01/19/21 [facility]   Headache    High cholesterol    History of hiatal hernia    History of pneumonia    walking pneumonia years ago   Hypertension    Hypothyroidism    Irritable bowel    Osteoarthritis    i.e right knee    PONV (postoperative nausea and vomiting)    Pre-diabetes    Retinopathy    htn 01/19/21 [facility]   Rheumatoid arthritis (HCC)  Shingles    Squamous cell carcinoma of skin of chest    removed   Thyroid  disease     Family History  Problem Relation Age of Onset   Hypertension Mother    Thyroid  disease Mother    Breast cancer Mother 2       breast dx age 77 died age 6    Prostate cancer Father 33       metastatic   Hypothyroidism Sister    Stroke Maternal Grandmother    Skin cancer Maternal Uncle        dx. in his 78s   Lung cancer Maternal Uncle        smoker, died age 77   Past Surgical  History:  Procedure Laterality Date   ABDOMINAL HYSTERECTOMY     age 33 ovaries out, cervix removed too   BREAST LUMPECTOMY WITH RADIOACTIVE SEED AND SENTINEL LYMPH NODE BIOPSY Right 03/28/2019   Procedure: RIGHT BREAST LUMPECTOMY WITH RADIOACTIVE SEED AND SENTINEL LYMPH NODE BIOPSY;  Surgeon: Belinda Cough, MD;  Location: [facility];  Service: General;  Laterality: Right;  PEC BLOCK FOR POST OP PAIN   BREAST SURGERY     CATARACT EXTRACTION, BILATERAL     CHOLECYSTECTOMY     10/2019   COLONOSCOPY     x2   ESOPHAGEAL DILATION     Dr. Obie 15 years from 2021- x2   EXCISION OF BREAST LESION Right 09/14/2023   Procedure: Right chest wall wound exploration;  Surgeon: Belinda Cough, MD;  Location: [facility];  Service: General;  Laterality: Right;   MASTECTOMY W/ SENTINEL NODE BIOPSY Right 09/14/2022   Procedure: RIGHT MASTECTOMY WITH SENTINEL LYMPH NODE BIOPSY;  Surgeon: Belinda Cough, MD;  Location: [facility];  Service: General;  Laterality: Right;  LMA PEC BLOCK   SKIN BIOPSY     04/2022, right arm, left leg   TONSILLECTOMY     69 years old- had trach   TRACHEOSTOMY     69 years old- placed and removed within 24 hours during tonsillectomy   TUBAL LIGATION     Social History[1] Social History   Social History Narrative   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   Lives with husband, former smoker 1 cigarrette a day but has quit     Immunization History  Administered Date(s) Administered   Fluad Quad(high Dose 65+) 03/11/2020, 10/29/2021   Fluad Trivalent(High Dose 65+) 11/08/2022   H1N1 02/27/2008   INFLUENZA, HIGH DOSE SEASONAL PF 10/14/2023   Influenza-Unspecified 11/18/2017   PFIZER(Purple Top)SARS-COV-2 Vaccination 03/02/2019, 03/29/2019, 01/01/2020   PNEUMOCOCCAL CONJUGATE-20 07/22/2021   Pfizer Covid-19 Vaccine Bivalent Booster 63yrs & up 02/02/2021   Pneumococcal Conjugate-13 10/03/2019   Td 01/19/2004   Tdap 12/17/2014     Objective: Vital Signs: There were no  vitals taken for this visit.   Physical Exam   Musculoskeletal Exam: ***  CDAI Exam: CDAI Score: -- Patient Global: --; Provider Global: -- Swollen: --; Tender: -- Joint Exam 02/29/2024   No joint exam has been documented for this visit   There is currently no information documented on the homunculus. Go to the Rheumatology activity and complete the homunculus joint exam.  Investigation: No additional findings.  Imaging: CT CHEST HIGH RESOLUTION Result Date: 02/06/2024 CLINICAL DATA:  Interstitial lung disease. EXAM: CT CHEST WITHOUT CONTRAST TECHNIQUE: Multidetector CT imaging of the chest was performed following the standard protocol without intravenous contrast. High resolution imaging of the lungs, as well as inspiratory and expiratory imaging, was  performed. RADIATION DOSE REDUCTION: This exam was performed according to the departmental dose-optimization program which includes automated exposure control, adjustment of the mA and/or kV according to patient size and/or use of iterative reconstruction technique. COMPARISON:  07/29/2022. FINDINGS: Cardiovascular: Atherosclerotic calcification of the aorta, aortic valve and coronary arteries. Heart size normal. No pericardial effusion. Mediastinum/Nodes: No pathologically enlarged mediastinal or axillary lymph nodes. Hilar regions are difficult to definitively evaluate without IV contrast. Surgical clips in the right axilla. Right mastectomy. Lungs/Pleura: Mild cylindrical bronchiectasis. Subpleural radiation scarring in the anterior right lung. Very mild basilar and lateral subpleural reticulation, occupying less than 5% of the lung parenchyma. Calcified granulomas. No pleural fluid. Airway is unremarkable. Mild air trapping. Upper Abdomen: Liver is mildly heterogeneous in attenuation and the margin is slightly irregular. Visualized portions of the liver, adrenal glands, kidneys, spleen, pancreas, stomach and bowel are otherwise grossly  unremarkable. No upper abdominal adenopathy. Musculoskeletal: Degenerative changes in the spine. IMPRESSION: 1. Mild basilar and lateral subpleural reticular densities occupy less than 5% of the lung parenchyma, compatible with interstitial lung abnormality (ILA). 2. Mild air trapping is indicative of small airways disease. 3. Mild cylindrical bronchiectasis. 4. Liver appears cirrhotic and steatotic. 5. Aortic atherosclerosis (ICD10-I70.0). Coronary artery calcification. Electronically Signed   By: Newell Eke M.D.   On: 02/06/2024 16:34    Recent Labs: Lab Results  Component Value Date   WBC 5.2 11/10/2023   HGB 13.8 11/10/2023   PLT 281 11/10/2023   NA 142 11/10/2023   K 4.5 11/10/2023   CL 106 11/10/2023   CO2 27 11/10/2023   GLUCOSE 110 (H) 11/10/2023   BUN 15 11/10/2023   CREATININE 0.52 (L) 11/10/2023   BILITOT 0.4 11/10/2023   ALKPHOS 46 03/18/2023   AST 23 11/10/2023   ALT 17 11/10/2023   PROT 7.2 11/10/2023   ALBUMIN 4.1 03/18/2023   CALCIUM  10.2 11/10/2023   GFRAA 108 04/22/2020   QFTBGOLDPLUS NEGATIVE 12/13/2023    Speciality Comments: Orencia  started Dec 2025  Procedures:  No procedures performed Allergies: Pentazocine, Pentazocine lactate, Tape, Lisinopril , Meperidine, Nystatin , and Ozempic  (0.25 or 0.5 mg-dose) [semaglutide (0.25 or 0.5mg -dos)]   Assessment / Plan:     Visit Diagnoses: Rheumatoid arthritis involving multiple sites with positive rheumatoid factor (HCC)  High risk medication use  ILD (interstitial lung disease) (HCC)  History of asthma  Sicca syndrome  Primary osteoarthritis of both feet  Primary osteoarthritis of both knees  Psoriasis  Rib pain on left side  History of gastroesophageal reflux (GERD)  Malignant neoplasm of lower-outer quadrant of right breast of female, estrogen receptor positive (HCC)  History of hyperlipidemia  History of hypothyroidism  Type 2 diabetes with complication (HCC)  History of  migraine  Orders: No orders of the defined types were placed in this encounter.  No orders of the defined types were placed in this encounter.   Face-to-face time spent with patient was *** minutes. Greater than 50% of time was spent in counseling and coordination of care.  Follow-Up Instructions: No follow-ups on file.   Waddell CHRISTELLA Craze, PA-C  Note - This record has been created using Dragon software.  Chart creation errors have been sought, but may not always  have been located. Such creation errors do not reflect on  the standard of medical care.     [1]  Social History Tobacco Use   Smoking status: Former    Current packs/day: 0.00    Average packs/day: 0.3 packs/day for 7.0 years (1.8 ttl  pk-yrs)    Types: Cigarettes    Start date: 39    Quit date: 2000    Years since quitting: 26.0    Passive exposure: Past   Smokeless tobacco: Never  Vaping Use   Vaping status: Never Used  Substance Use Topics   Alcohol  use: Yes    Comment: rare- with birthdays and beach trips   Drug use: Never   "

## 2024-02-17 ENCOUNTER — Ambulatory Visit

## 2024-02-17 VITALS — Ht 66.0 in | Wt 201.0 lb

## 2024-02-17 DIAGNOSIS — Z0000 Encounter for general adult medical examination without abnormal findings: Secondary | ICD-10-CM | POA: Diagnosis not present

## 2024-02-17 NOTE — Patient Instructions (Addendum)
 Cheryl Knight,  Thank you for taking the time for your Medicare Wellness Visit. I appreciate your continued commitment to your health goals. Please review the care plan we discussed, and feel free to reach out if I can assist you further.  Please note that Annual Wellness Visits do not include a physical exam. Some assessments may be limited, especially if the visit was conducted virtually. If needed, we may recommend an in-person follow-up with your provider.  Ongoing Care Seeing your primary care provider every 3 to 6 months helps us  monitor your health and provide consistent, personalized care.   Referrals If a referral was made during today's visit and you haven't received any updates within two weeks, please contact the referred provider directly to check on the status.  Recommended Screenings:  Health Maintenance  Topic Date Due   Zoster (Shingles) Vaccine (1 of 2) Never done   Complete foot exam   11/08/2023   COVID-19 Vaccine (5 - 2025-26 season) 09/29/2024*   Kidney health urinalysis for diabetes  04/12/2024   Hemoglobin A1C  06/11/2024   Colon Cancer Screening  08/13/2024   Breast Cancer Screening  08/28/2024   Eye exam for diabetics  10/06/2024   Yearly kidney function blood test for diabetes  11/09/2024   DTaP/Tdap/Td vaccine (3 - Td or Tdap) 12/16/2024   Medicare Annual Wellness Visit  02/16/2025   Pneumococcal Vaccine for age over 44  Completed   Flu Shot  Completed   Osteoporosis screening with Bone Density Scan  Completed   Hepatitis C Screening  Completed   Meningitis B Vaccine  Aged Out  *Topic was postponed. The date shown is not the original due date.       02/17/2024    2:33 PM  Advanced Directives  Does Patient Have a Medical Advance Directive? Yes  Type of Estate Agent of [place];Living will  Does patient want to make changes to medical advance directive? Yes (Inpatient - patient requests chaplain consult to change a medical advance  directive)  Copy of Healthcare Power of Attorney in Chart? No - copy requested    Vision: Annual vision screenings are recommended for early detection of glaucoma, cataracts, and diabetic retinopathy. These exams can also reveal signs of chronic conditions such as diabetes and high blood pressure.  Dental: Annual dental screenings help detect early signs of oral cancer, gum disease, and other conditions linked to overall health, including heart disease and diabetes.

## 2024-02-23 ENCOUNTER — Encounter: Payer: Self-pay | Admitting: Oncology

## 2024-02-23 ENCOUNTER — Inpatient Hospital Stay: Admitting: Oncology

## 2024-02-23 ENCOUNTER — Inpatient Hospital Stay

## 2024-02-23 VITALS — BP 113/70 | HR 103 | Temp 98.6°F | Resp 19 | Wt 202.3 lb

## 2024-02-23 DIAGNOSIS — C50111 Malignant neoplasm of central portion of right female breast: Secondary | ICD-10-CM

## 2024-02-23 DIAGNOSIS — C50119 Malignant neoplasm of central portion of unspecified female breast: Secondary | ICD-10-CM | POA: Insufficient documentation

## 2024-02-23 DIAGNOSIS — C50511 Malignant neoplasm of lower-outer quadrant of right female breast: Secondary | ICD-10-CM

## 2024-02-23 MED ORDER — TAMOXIFEN CITRATE 20 MG PO TABS: ORAL_TABLET | ORAL | 1 refills | Status: AC

## 2024-02-23 NOTE — Assessment & Plan Note (Signed)
 Diagnosed in 02/2019, s/p right lumpectomy with SLNB on 03/28/19 by Dr Belinda. Her path showed 0.9 cm mass complete resected with clear margins, node negative. She completed adjuvant radiation.  started antiestrogen therapy with Tamoxifen  20mg  once daily in 06/2019 with plan of 5 years.  Refill of tamoxifen  was sent to pharmacy.

## 2024-02-23 NOTE — Progress Notes (Unsigned)
 " Hematology/Oncology Consult Note [phone] [phone]     REFERRING PROVIDER: Bennett Reuben POUR, MD    CHIEF COMPLAINTS/PURPOSE OF CONSULTATION:  Right breast cancer.   ASSESSMENT & PLAN:   Malignant neoplasm of lower-outer quadrant of right breast of female, estrogen receptor positive (HCC) Diagnosed in 02/2019, s/p right lumpectomy with SLNB on 03/28/19 by Dr Belinda. Her path showed 0.9 cm mass complete resected with clear margins, node negative. She completed adjuvant radiation.  started antiestrogen therapy with Tamoxifen  20mg  once daily in 06/2019 with plan of 5 years.  Refill of tamoxifen  was sent to pharmacy.   Malignant neoplasm of central portion of female breast [facility]) She developed another right breast invasive carcinoma, with different features.  ER negative, PR negative and HER 2 IHC 3+ positive. pT2 pN0 She did not follow up with previous oncologist De. Feng after right mastectomy and did not receive adjuvant chemotherapy or HER2 target therapy   Given the lack of evidence for delayed adjuvant therapy benefit, recommend restaging.  If imaging is negative: proceed with close surveillance  Check Signatera .   No orders of the defined types were placed in this encounter.   All questions were answered. The patient knows to call the clinic with any problems, questions or concerns.  Zelphia Cap, MD, PhD [facility] Hematology Oncology 02/23/2024    HISTORY OF PRESENTING ILLNESS:  Cheryl Knight 69 y.o. female presents to establish care for right breast cancer.   Discussed the use of AI scribe software for clinical note transcription with the patient, who gave verbal consent to proceed.   I have reviewed her chart and materials related to her cancer extensively and collaborated history with the patient. Summary of oncologic history is as follows: Oncology History Overview Note  Cancer Staging Malignant neoplasm of lower-outer quadrant of right  breast of female, estrogen receptor positive (HCC) Staging form: Breast, AJCC 8th Edition - Clinical stage from 03/14/2019: Stage IA (cT1a, cN0, cM0, G1, ER+, PR+, HER2-) - Signed by Lanny Callander, MD on 03/20/2019 - Pathologic stage from 03/28/2019: Stage IA (pT1b, pN0(sn), cM0, G1, ER+, PR+, HER2-) - Unsigned    Malignant neoplasm of lower-outer quadrant of right breast of female, estrogen receptor positive (HCC)  02/22/2019 Imaging   Bone Density Scan  02/22/19 DEXA shows osteopenia with lowest T-score -1.4 at left hip   03/09/2019 Mammogram   Diagnostic Mammogram 03/09/19 IMPRESSION the 5mm mass in 7:00 posiiton of right breast suspicous of malignancy.     03/14/2019 Cancer Staging   Staging form: Breast, AJCC 8th Edition - Clinical stage from 03/14/2019: Stage IA (cT1a, cN0, cM0, G1, ER+, PR+, HER2-) - Signed by Lanny Callander, MD on 03/20/2019   03/14/2019 Initial Biopsy   Diagnosis 03/14/19  Breast, right, needle core biopsy, 7 o'clock, 5cmfn - INVASIVE DUCTAL CARCINOMA. SEE NOTE Diagnosis Note Carcinoma measures 0.4 cm in greatest linear dimension and appears grade 1. Dr. Jodene reviewed the case and concurs with the diagnosis. A breast prognostic profile (ER, PR, Ki-67 and HER2) is pending and will be reported in an addendum. Dr. Ardeen was notified on 11/12/2019.   03/14/2019 Receptors her2   PROGNOSTIC INDICATORS Results: IMMUNOHISTOCHEMICAL AND MORPHOMETRIC ANALYSIS PERFORMED MANUALLY The tumor cells are EQUIVOCAL for Her2 (2+). Her2 by FISH will be performed and results reported separately. Estrogen Receptor: 95%, POSITIVE, STRONG STAINING INTENSITY Progesterone Receptor: 90%, POSITIVE, MODERATE STAINING INTENSITY Proliferation Marker Ki67: 10%   03/19/2019 Initial Diagnosis   Malignant neoplasm of lower-outer quadrant of  right breast of female, estrogen receptor positive (HCC)   03/28/2019 Surgery   RIGHT BREAST LUMPECTOMY WITH RADIOACTIVE SEED AND SENTINEL LYMPH NODE BIOPSY by Dr  Belinda    03/28/2019 Pathology Results   FINAL MICROSCOPIC DIAGNOSIS:   A. BREAST, RIGHT, LUMPECTOMY:  - Invasive ductal carcinoma, 0.9 cm, Nottingham grade 1 of 3.  - Ductal carcinoma in situ, intermediate nuclear grade.  - Margins of resection are not involved.       - Invasive carcinoma, closest margin: 3 mm, superior.       - In situ carcinoma, closest margin: 3 mm, medial.  - Biopsy site.  - See oncology table.   B. SENTINEL LYMPH NODE, RIGHT AXILLARY #1, BIOPSY:  - One lymph node, negative for carcinoma (0/1).   C. SENTINEL LYMPH NODE, RIGHT AXILLARY #2, BIOPSY:  - One lymph node, negative for carcinoma (0/1).    03/28/2019 Cancer Staging   Staging form: Breast, AJCC 8th Edition - Pathologic stage from 03/28/2019: Stage IA (pT1b, pN0(sn), cM0, G1, ER+, PR+, HER2-) - Signed by Lanny Callander, MD on 06/03/2019   04/26/2019 Genetic Testing   Negative genetic testing:  No pathogenic variants detected on the [facility] Common Hereditary Cancers panel. The report date is 04/26/2019.  The Common Hereditary Cancers Panel offered by [facility] includes sequencing and/or deletion duplication testing of the following 48 genes: APC, ATM, AXIN2, BARD1, BMPR1A, BRCA1, BRCA2, BRIP1, CDH1, CDK4, CDKN2A (p14ARF), CDKN2A (p16INK4a), CHEK2, CTNNA1, DICER1, EPCAM (Deletion/duplication testing only), GREM1 (promoter region deletion/duplication testing only), KIT, MEN1, MLH1, MSH2, MSH3, MSH6, MUTYH, NBN, NF1, NHTL1, PALB2, PDGFRA, PMS2, POLD1, POLE, PTEN, RAD50, RAD51C, RAD51D, RNF43, SDHB, SDHC, SDHD, SMAD4, SMARCA4. STK11, TP53, TSC1, TSC2, and VHL.  The following genes were evaluated for sequence changes only: SDHA and HOXB13 c.251G>A variant only.    05/08/2019 - 06/04/2019 Radiation Therapy   Adjuvant Radiation with Dr Dewey    06/2019 -  Anti-estrogen oral therapy   Tamoxifen  starting in 06/2019    09/04/2019 Survivorship   SCP delivered by Lacie Burton, NP    Malignant neoplasm of central portion of female  breast (HCC)  09/14/2022 Initial Diagnosis   Malignant neoplasm of central portion of female breast   mammogram that revealed a focal asymmetry in the right breast at 12:00. Work-up revealed a 1.4 x 0.8 x 1.4 cm mass 5-6 cmfn. Biopsy revealed DCIS high-grade/ ER positive/ PR negative with suspicion for microinvasion.   09/14/2022 right mastectomy with sentinel lymph node biopsy  Pathology showed   A. BREAST, RIGHT, MASTECTOMY:  Invasive ductal carcinoma, 2.0 x 1.5 x 1.3 cm, grade II/III  Ductal carcinoma in situ: Solid and cribriform type with comedonecrosis,  nuclear grade 3 of 3  Margins, invasive: Negative  Closest, invasive: Anterior 10 mm  Margins, DCIS: Negative  Closest, DCIS: Anterior at 10 mm  Lymphovascular invasion: Present   B. LYMPH NODE, RIGHT AXILLARY, SENTINEL, EXCISION:  -  1 lymph node, negative for malignancy (0/1), serially sectioned a multiple levels examined   Histologic Grade:      Glandular (Acinar)/Tubular Differentiation: 3/3      Nuclear Pleomorphism: 3/3      Mitotic Rate: 1/3      Overall Grade: II/III Tumor Size: 20 x 15 x 13 mm Ductal Carcinoma In Situ: Ductal carcinoma in situ solid and cribriform type, nuclear grade 3 of 3 with comedonecrosis Lymphatic and/or Vascular Invasion: Present Treatment Effect in the Breast: No known presurgical therapy Margins: All margins negative for invasive carcinoma  Distance from Closest Margin (mm): Anterior at 10 mm      Specify Closest Margin (required only if <67mm): N/A DCIS Margins: Uninvolved by DCIS      Distance from Closest Margin (mm): Anterior at 10 mm      Specify Closest Margin (required only if <77mm): N/A Regional Lymph Nodes:      Number of Lymph Nodes Examined: 1      Number of Sentinel Nodes Examined: 1      Number of Lymph Nodes with Macrometastases (>2 mm): 0      Number of Lymph Nodes with Micrometastases: 0      Number of Lymph Nodes with Isolated Tumor Cells (=0.2 mm or =200 cells):  0      Size of Largest Metastatic Deposit (mm): N/A      Extranodal Extension: N/A Distant Metastasis:      Distant Site(s) Involved: N/A  Pathologic Stage Classification (pTNM, AJCC 8th Edition): pT2, pN0 Representative Tumor Block: A4 Comment(s): N/A   The tumor cells are POSITIVE for Her2 (3+).   Estrogen Receptor:       NEGATIVE (0)  Progesterone Receptor:   NEGATIVE (0)  Proliferation Marker Ki-67:   10%      After right mastectomy, patient did not follow-up with her previous oncologist Dr. Lanny and was not a started on adjuvant treatments.   She continues to follow-up with surgery and was last seen in September 2025.  She had a persistent seroma that required multiple aspiration. 09/14/2023, she underwent wound exploration of the right chest wall. Pathology showed skin with underlying inflammation and granulation tissue  Today patient presents to establish care.  She would like to transfer her care to our cancer center due to currently living in [place] and outside is closer to home.  She continues daily tamoxifen  without interruption and has not experienced thromboembolic events, myocardial infarction, or cerebrovascular accident. She takes Excedrin Migraine three to four times weekly for headaches. She is scheduled for a left breast mammogram at an outside facility and will provide results when available. She remains physically active and has a history of hysterectomy, supporting continued tamoxifen  use without concern for uterine side effects.  She has osteopenia, with the last bone density scan performed over two years ago, and is due for repeat assessment. Tamoxifen  was selected over aromatase inhibitors due to her bone density status.  She also has rheumatoid arthritis and osteoarthritis, with recent flares in the past 6-12 months causing significant swelling and discomfort in her wrists and hands. Symptoms have improved since starting abatacept  in addition to leflunomide .  She is in the process of transferring rheumatology care locally for improved access during flares. She receives regular laboratory monitoring through her primary care provider and is not currently on diabetic medications, as her hemoglobin A1c remains below 7.  MEDICAL HISTORY:  Past Medical History:  Diagnosis Date   Cancer (HCC) 02/2019   right breast IDC   Cataracts, bilateral    tbd surgery 2021   Complication of anesthesia    Cough    Diabetes mellitus without complication (HCC)    Difficult intubation    TRACH AT 19   Dry eye syndrome    Dyspnea    W/ COUGHING- was due to lisinopril  and septic gallbladder   Family history of adverse reaction to anesthesia    sister coded with propofol  use   Family history of breast cancer    Family history of lung cancer    Family  history of prostate cancer    Family history of skin cancer    GERD (gastroesophageal reflux disease)    Glaucoma    stable glaucoma suspect 01/19/21 [facility]   Headache    High cholesterol    History of hiatal hernia    History of pneumonia    walking pneumonia years ago   Hypertension    Hypothyroidism    Irritable bowel    Osteoarthritis    i.e right knee    PONV (postoperative nausea and vomiting)    Pre-diabetes    Retinopathy    htn 01/19/21 [facility]   Rheumatoid arthritis (HCC)    Shingles    Squamous cell carcinoma of skin of chest    removed   Thyroid  disease     SURGICAL HISTORY: Past Surgical History:  Procedure Laterality Date   ABDOMINAL HYSTERECTOMY     age 1 ovaries out, cervix removed too   BREAST LUMPECTOMY WITH RADIOACTIVE SEED AND SENTINEL LYMPH NODE BIOPSY Right 03/28/2019   Procedure: RIGHT BREAST LUMPECTOMY WITH RADIOACTIVE SEED AND SENTINEL LYMPH NODE BIOPSY;  Surgeon: Belinda Cough, MD;  Location: [facility];  Service: General;  Laterality: Right;  PEC BLOCK FOR POST OP PAIN   BREAST SURGERY     CATARACT EXTRACTION, BILATERAL     CHOLECYSTECTOMY      10/2019   COLONOSCOPY     x2   ESOPHAGEAL DILATION     Dr. Obie 15 years from 2021- x2   EXCISION OF BREAST LESION Right 09/14/2023   Procedure: Right chest wall wound exploration;  Surgeon: Belinda Cough, MD;  Location: [facility];  Service: General;  Laterality: Right;   MASTECTOMY W/ SENTINEL NODE BIOPSY Right 09/14/2022   Procedure: RIGHT MASTECTOMY WITH SENTINEL LYMPH NODE BIOPSY;  Surgeon: Belinda Cough, MD;  Location: [facility];  Service: General;  Laterality: Right;  LMA PEC BLOCK   SKIN BIOPSY     04/2022, right arm, left leg   TONSILLECTOMY     69 years old- had trach   TRACHEOSTOMY     69 years old- placed and removed within 24 hours during tonsillectomy   TUBAL LIGATION      SOCIAL HISTORY: Social History   Socioeconomic History   Marital status: Married    Spouse name: Not on file   Number of children: 2   Years of education: Not on file   Highest education level: Associate degree: occupational, scientist, product/process development, or vocational program  Occupational History   Occupation: nurse  Tobacco Use   Smoking status: Former    Current packs/day: 0.00    Average packs/day: 0.3 packs/day for 7.0 years (1.8 ttl pk-yrs)    Types: Cigarettes    Start date: 9    Quit date: 2000    Years since quitting: 26.1    Passive exposure: Past   Smokeless tobacco: Never  Vaping Use   Vaping status: Never Used  Substance and Sexual Activity   Alcohol  use: Not Currently   Drug use: Never   Sexual activity: Yes    Birth control/protection: None  Other Topics Concern   Not on file  Social History Narrative   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   Lives with husband    former smoker 1 cigarrette a day but has quit   Social Drivers of Health   Tobacco Use: Medium Risk (02/23/2024)   Patient History    Smoking Tobacco Use: Former    Smokeless Tobacco Use: Never  Passive Exposure: Past  Physicist, Medical Strain: Low Risk (04/21/2023)   Overall Financial Resource Strain (CARDIA)    Difficulty  of Paying Living Expenses: Not hard at all  Food Insecurity: No Food Insecurity (02/23/2024)   Epic    Worried About Programme Researcher, Broadcasting/film/video in the Last Year: Never true    Ran Out of Food in the Last Year: Never true  Transportation Needs: No Transportation Needs (02/23/2024)   Epic    Lack of Transportation (Medical): No    Lack of Transportation (Non-Medical): No  Physical Activity: Insufficiently Active (02/17/2024)   Exercise Vital Sign    Days of Exercise per Week: 2 days    Minutes of Exercise per Session: 20 min  Stress: No Stress Concern Present (02/17/2024)   [employer] of Occupational Health - Occupational Stress Questionnaire    Feeling of Stress: Not at all  Social Connections: Moderately Integrated (02/17/2024)   Social Connection and Isolation Panel    Frequency of Communication with Friends and Family: Twice a week    Frequency of Social Gatherings with Friends and Family: Twice a week    Attends Religious Services: Never    Database Administrator or Organizations: Yes    Attends Banker Meetings: 1 to 4 times per year    Marital Status: Married  Catering Manager Violence: Not At Risk (02/23/2024)   Epic    Fear of Current or Ex-Partner: No    Emotionally Abused: No    Physically Abused: No    Sexually Abused: No  Depression (PHQ2-9): Low Risk (02/23/2024)   Depression (PHQ2-9)    PHQ-2 Score: 0  Alcohol  Screen: Low Risk (11/21/2022)   Alcohol  Screen    Last Alcohol  Screening Score (AUDIT): 1  Housing: Low Risk (02/23/2024)   Epic    Unable to Pay for Housing in the Last Year: No    Number of Times Moved in the Last Year: 0    Homeless in the Last Year: No  Utilities: Not At Risk (02/23/2024)   Epic    Threatened with loss of utilities: No  Health Literacy: Adequate Health Literacy (02/17/2024)   B1300 Health Literacy    Frequency of need for help with medical instructions: Never    FAMILY HISTORY: Family History  Problem Relation Age of Onset    Hypertension Mother    Thyroid  disease Mother    Breast cancer Mother 55       breast dx age 68 died age 51    Prostate cancer Father 34       metastatic   Hypothyroidism Sister    Stroke Maternal Grandmother    Skin cancer Maternal Uncle        dx. in his 64s   Lung cancer Maternal Uncle        smoker, died age 77    ALLERGIES:  is allergic to pentazocine, pentazocine lactate, tape, lisinopril , meperidine, nystatin , and ozempic  (0.25 or 0.5 mg-dose) [semaglutide (0.25 or 0.5mg -dos)].  MEDICATIONS:  Current Outpatient Medications  Medication Sig Dispense Refill   Abatacept  (ORENCIA  CLICKJECT) 125 MG/ML SOAJ Inject 125 mg into the skin once a week. 12 mL 0   amLODipine  (NORVASC ) 2.5 MG tablet TAKE 1 TABLET BY MOUTH DAILY 90 tablet 3   aspirin-acetaminophen -caffeine (EXCEDRIN MIGRAINE) 250-250-65 MG tablet Take 2 tablets by mouth every 8 (eight) hours as needed for headache.     betamethasone  dipropionate 0.05 % cream Apply topically 2 (two) times daily. (Patient taking differently: Apply  topically as needed.)     Blood Glucose Monitoring Suppl DEVI 1 each by Does not apply route 3 (three) times a week. 1 each 0   Calcium  Carb-Cholecalciferol (CALCIUM  600 + D PO) Take 1 tablet by mouth 2 (two) times daily.     Calcium  Carbonate Antacid 1177 MG CHEW Chew 1,177 mg by mouth at bedtime.     cyanocobalamin  (VITAMIN B12) 1000 MCG tablet Take 1,000 mcg by mouth every other day.     Dextran 70-Hypromellose ([employer] NATURAL TEARS PF OP) Place 1 drop into both eyes See admin instructions. Instill 1 drop into both eyes up to 6 times daily as needed for dry eyes     esomeprazole  (NEXIUM ) 40 MG capsule TAKE 1 CAPSULE(40 MG) BY MOUTH DAILY 90 capsule 3   famotidine  (PEPCID ) 20 MG tablet TAKE 1 TABLET BY MOUTH 2 TIMES A DAY 180 tablet 3   glucosamine-chondroitin 500-400 MG tablet Take 1 tablet by mouth 2 (two) times daily.     Glucose Blood (BLOOD GLUCOSE TEST STRIPS) STRP 1 each by In Vitro route 3 (three)  times a week. 100 strip 0   Lancets (ONETOUCH DELICA PLUS LANCET33G) MISC SMARTSIG:Via Meter 3 Times a Week     Lancets Misc. MISC 1 each by Does not apply route 3 (three) times a week. 100 each 0   leflunomide  (ARAVA ) 20 MG tablet TAKE 1 TABLET BY MOUTH DAILY 90 tablet 0   levothyroxine  (SYNTHROID ) 100 MCG tablet TAKE 1 TABLET BY MOUTH DAILY 1HR BEFORE FOOD OR OTHER MEDICATIONS 90 tablet 3   loperamide (IMODIUM A-D) 2 MG tablet Take 4 mg by mouth daily.     loratadine  (CLARITIN ) 10 MG tablet Take 10 mg by mouth daily.     Melatonin 10 MG CAPS Take 10 mg by mouth at bedtime.     Multiple Vitamin (MULTIVITAMIN WITH MINERALS) TABS tablet Take 1 tablet by mouth daily.     naproxen (NAPROSYN) 500 MG tablet Take 500 mg by mouth 2 (two) times daily.     olmesartan -hydrochlorothiazide  (BENICAR  HCT) 20-12.5 MG tablet TAKE 1 TABLET BY MOUTH EVERY MORNING 90 tablet 3   Omega-3 Fatty Acids (FISH OIL) 1000 MG CAPS Take 1,000 mg by mouth daily.     OVER THE COUNTER MEDICATION Blink Eye Drops     oxyCODONE  (OXY IR/ROXICODONE ) 5 MG immediate release tablet Take 1 tablet (5 mg total) by mouth every 6 (six) hours as needed for severe pain (pain score 7-10). 15 tablet 0   RESTASIS  MULTIDOSE 0.05 % ophthalmic emulsion SMARTSIG:In Eye(s)     rizatriptan  (MAXALT ) 10 MG tablet Take 1 tablet (10 mg total) by mouth as needed for migraine. May repeat in 2 hours if needed 90 tablet 0   simvastatin  (ZOCOR ) 40 MG tablet Take 1 tablet (40 mg total) by mouth at bedtime. 90 tablet 3   tiZANidine  (ZANAFLEX ) 4 MG tablet Take 1 tablet (4 mg total) by mouth at bedtime as needed for muscle spasms. 30 tablet 0   topiramate  (TOPAMAX ) 100 MG tablet TAKE 1 TABLET(100 MG) BY MOUTH DAILY 90 tablet 3   White Petrolatum-Mineral Oil (SYSTANE NIGHTTIME) OINT Apply to eye.     tamoxifen  (NOLVADEX ) 20 MG tablet TAKE 1 TABLET(20 MG) BY MOUTH DAILY 90 tablet 1   No current facility-administered medications for this visit.    Review of  Systems  Constitutional:  Negative for appetite change, chills, fatigue and fever.  HENT:   Negative for hearing loss and voice  change.   Eyes:  Negative for eye problems.  Respiratory:  Negative for chest tightness and cough.   Cardiovascular:  Negative for chest pain.  Gastrointestinal:  Negative for abdominal distention, abdominal pain and blood in stool.  Endocrine: Negative for hot flashes.  Genitourinary:  Negative for difficulty urinating and frequency.   Musculoskeletal:  Positive for arthralgias.  Skin:  Negative for itching and rash.  Neurological:  Negative for extremity weakness.  Hematological:  Negative for adenopathy.  Psychiatric/Behavioral:  Negative for confusion.      PHYSICAL EXAMINATION: ECOG PERFORMANCE STATUS: 0 - Asymptomatic  Vitals:   02/23/24 1404  BP: 113/70  Pulse: (!) 103  Resp: 19  Temp: 98.6 F (37 C)  SpO2: 99%   Filed Weights   02/23/24 1404  Weight: 202 lb 4.8 oz (91.8 kg)    Physical Exam Constitutional:      General: She is not in acute distress.    Appearance: She is not diaphoretic.  HENT:     Head: Normocephalic and atraumatic.  Eyes:     General: No scleral icterus. Cardiovascular:     Rate and Rhythm: Normal rate and regular rhythm.  Pulmonary:     Effort: Pulmonary effort is normal. No respiratory distress.     Breath sounds: Normal breath sounds. No wheezing.  Abdominal:     General: There is no distension.     Palpations: Abdomen is soft.     Tenderness: There is no abdominal tenderness.  Musculoskeletal:        General: Normal range of motion.     Cervical back: Normal range of motion and neck supple.  Skin:    Findings: No erythema.  Neurological:     Mental Status: She is alert and oriented to person, place, and time. Mental status is at baseline.     Motor: No abnormal muscle tone.  Psychiatric:        Mood and Affect: Mood and affect normal.    Breast exam is performed in seated and lying down position.   Patient is status post right mastectomy with a well-healed surgical scar.  She has scarring tissue at the mastectomy site. No discharge.   No palpable left breast mass No palpable axillary adenopathy bilaterally.   LABORATORY DATA:  I have reviewed the data as listed    Latest Ref Rng & Units 11/10/2023   10:41 AM 06/22/2023    9:35 AM 03/15/2023   10:34 AM  CBC  WBC 3.8 - 10.8 Thousand/uL 5.2  4.7  6.0   Hemoglobin 11.7 - 15.5 g/dL 86.1  87.2  85.9   Hematocrit 35.0 - 45.0 % 42.9  40.0  43.3   Platelets 140 - 400 Thousand/uL 281  260  317       Latest Ref Rng & Units 11/10/2023   10:41 AM 09/08/2023    9:18 AM 06/22/2023    9:35 AM  CMP  Glucose 65 - 99 mg/dL 889  895  873   BUN 7 - 25 mg/dL 15  10  12    Creatinine 0.60 - 1.00 mg/dL 9.47  9.38  9.52   Sodium 135 - 146 mmol/L 142  144  142   Potassium 3.5 - 5.3 mmol/L 4.5  4.0  3.6   Chloride 98 - 110 mmol/L 106  105  107   CO2 20 - 32 mmol/L 27  23  25    Calcium  8.6 - 10.4 mg/dL 89.7  9.5  9.6   Total  Protein 6.1 - 8.1 g/dL 7.2   7.0   Total Bilirubin 0.2 - 1.2 mg/dL 0.4   0.3   AST 10 - 35 U/L 23   20   ALT 6 - 29 U/L 17   15      RADIOGRAPHIC STUDIES: I have personally reviewed the radiological images as listed and agreed with the findings in the report. CT CHEST HIGH RESOLUTION Result Date: 02/06/2024 CLINICAL DATA:  Interstitial lung disease. EXAM: CT CHEST WITHOUT CONTRAST TECHNIQUE: Multidetector CT imaging of the chest was performed following the standard protocol without intravenous contrast. High resolution imaging of the lungs, as well as inspiratory and expiratory imaging, was performed. RADIATION DOSE REDUCTION: This exam was performed according to the departmental dose-optimization program which includes automated exposure control, adjustment of the mA and/or kV according to patient size and/or use of iterative reconstruction technique. COMPARISON:  07/29/2022. FINDINGS: Cardiovascular: Atherosclerotic calcification  of the aorta, aortic valve and coronary arteries. Heart size normal. No pericardial effusion. Mediastinum/Nodes: No pathologically enlarged mediastinal or axillary lymph nodes. Hilar regions are difficult to definitively evaluate without IV contrast. Surgical clips in the right axilla. Right mastectomy. Lungs/Pleura: Mild cylindrical bronchiectasis. Subpleural radiation scarring in the anterior right lung. Very mild basilar and lateral subpleural reticulation, occupying less than 5% of the lung parenchyma. Calcified granulomas. No pleural fluid. Airway is unremarkable. Mild air trapping. Upper Abdomen: Liver is mildly heterogeneous in attenuation and the margin is slightly irregular. Visualized portions of the liver, adrenal glands, kidneys, spleen, pancreas, stomach and bowel are otherwise grossly unremarkable. No upper abdominal adenopathy. Musculoskeletal: Degenerative changes in the spine. IMPRESSION: 1. Mild basilar and lateral subpleural reticular densities occupy less than 5% of the lung parenchyma, compatible with interstitial lung abnormality (ILA). 2. Mild air trapping is indicative of small airways disease. 3. Mild cylindrical bronchiectasis. 4. Liver appears cirrhotic and steatotic. 5. Aortic atherosclerosis (ICD10-I70.0). Coronary artery calcification. Electronically Signed   By: Newell Eke M.D.   On: 02/06/2024 16:34    "

## 2024-02-23 NOTE — Assessment & Plan Note (Signed)
 She developed another right breast invasive carcinoma, with different features.  ER negative, PR negative and HER 2 IHC 3+ positive. pT2 pN0 She did not follow up with previous oncologist De. Feng after right mastectomy and did not receive adjuvant chemotherapy or HER2 target therapy   Given the lack of evidence for delayed adjuvant therapy benefit, recommend restaging.  If imaging is negative: proceed with close surveillance  Check [place] .

## 2024-02-29 ENCOUNTER — Ambulatory Visit: Admitting: Physician Assistant

## 2024-02-29 DIAGNOSIS — Z8719 Personal history of other diseases of the digestive system: Secondary | ICD-10-CM

## 2024-02-29 DIAGNOSIS — M170 Bilateral primary osteoarthritis of knee: Secondary | ICD-10-CM

## 2024-02-29 DIAGNOSIS — L409 Psoriasis, unspecified: Secondary | ICD-10-CM

## 2024-02-29 DIAGNOSIS — C50511 Malignant neoplasm of lower-outer quadrant of right female breast: Secondary | ICD-10-CM

## 2024-02-29 DIAGNOSIS — J849 Interstitial pulmonary disease, unspecified: Secondary | ICD-10-CM

## 2024-02-29 DIAGNOSIS — M3500 Sicca syndrome, unspecified: Secondary | ICD-10-CM

## 2024-02-29 DIAGNOSIS — Z79899 Other long term (current) drug therapy: Secondary | ICD-10-CM

## 2024-02-29 DIAGNOSIS — Z8709 Personal history of other diseases of the respiratory system: Secondary | ICD-10-CM

## 2024-02-29 DIAGNOSIS — M19072 Primary osteoarthritis, left ankle and foot: Secondary | ICD-10-CM

## 2024-02-29 DIAGNOSIS — M0579 Rheumatoid arthritis with rheumatoid factor of multiple sites without organ or systems involvement: Secondary | ICD-10-CM

## 2024-02-29 DIAGNOSIS — Z8669 Personal history of other diseases of the nervous system and sense organs: Secondary | ICD-10-CM

## 2024-02-29 DIAGNOSIS — Z8639 Personal history of other endocrine, nutritional and metabolic disease: Secondary | ICD-10-CM

## 2024-02-29 DIAGNOSIS — E118 Type 2 diabetes mellitus with unspecified complications: Secondary | ICD-10-CM

## 2024-02-29 DIAGNOSIS — R0789 Other chest pain: Secondary | ICD-10-CM

## 2024-04-19 ENCOUNTER — Ambulatory Visit: Admitting: Rheumatology

## 2024-06-12 ENCOUNTER — Ambulatory Visit

## 2024-08-22 ENCOUNTER — Inpatient Hospital Stay: Admitting: Oncology

## 2025-02-18 ENCOUNTER — Ambulatory Visit
